# Patient Record
Sex: Male | Born: 1945
Health system: Southern US, Community
[De-identification: ages and names within clinical notes are randomized; demographics above are authoritative.]

## PROBLEM LIST (undated history)

## (undated) DIAGNOSIS — G4733 Obstructive sleep apnea (adult) (pediatric): Secondary | ICD-10-CM

## (undated) DIAGNOSIS — K219 Gastro-esophageal reflux disease without esophagitis: Secondary | ICD-10-CM

## (undated) DIAGNOSIS — E669 Obesity, unspecified: Secondary | ICD-10-CM

## (undated) DIAGNOSIS — I2510 Atherosclerotic heart disease of native coronary artery without angina pectoris: Secondary | ICD-10-CM

## (undated) DIAGNOSIS — T883XXA Malignant hyperthermia due to anesthesia, initial encounter: Secondary | ICD-10-CM

## (undated) DIAGNOSIS — T4145XA Adverse effect of unspecified anesthetic, initial encounter: Secondary | ICD-10-CM

## (undated) DIAGNOSIS — I10 Essential (primary) hypertension: Secondary | ICD-10-CM

## (undated) DIAGNOSIS — I259 Chronic ischemic heart disease, unspecified: Secondary | ICD-10-CM

## (undated) DIAGNOSIS — Z9889 Other specified postprocedural states: Secondary | ICD-10-CM

## (undated) DIAGNOSIS — Z9989 Dependence on other enabling machines and devices: Secondary | ICD-10-CM

## (undated) DIAGNOSIS — E785 Hyperlipidemia, unspecified: Secondary | ICD-10-CM

## (undated) DIAGNOSIS — Z8489 Family history of other specified conditions: Secondary | ICD-10-CM

## (undated) DIAGNOSIS — N529 Male erectile dysfunction, unspecified: Secondary | ICD-10-CM

## (undated) DIAGNOSIS — I350 Nonrheumatic aortic (valve) stenosis: Secondary | ICD-10-CM

## (undated) DIAGNOSIS — G709 Myoneural disorder, unspecified: Secondary | ICD-10-CM

## (undated) DIAGNOSIS — K2270 Barrett's esophagus without dysplasia: Secondary | ICD-10-CM

## (undated) DIAGNOSIS — Z953 Presence of xenogenic heart valve: Secondary | ICD-10-CM

## (undated) DIAGNOSIS — R112 Nausea with vomiting, unspecified: Secondary | ICD-10-CM

## (undated) DIAGNOSIS — T8859XA Other complications of anesthesia, initial encounter: Secondary | ICD-10-CM

## (undated) DIAGNOSIS — Z955 Presence of coronary angioplasty implant and graft: Secondary | ICD-10-CM

## (undated) HISTORY — PX: CAROTID ENDARTERECTOMY: SUR193

## (undated) HISTORY — DX: Gastro-esophageal reflux disease without esophagitis: K21.9

## (undated) HISTORY — DX: Essential (primary) hypertension: I10

## (undated) HISTORY — DX: Obesity, unspecified: E66.9

## (undated) HISTORY — DX: Dependence on other enabling machines and devices: Z99.89

## (undated) HISTORY — DX: Chronic ischemic heart disease, unspecified: I25.9

## (undated) HISTORY — DX: Nonrheumatic aortic (valve) stenosis: I35.0

## (undated) HISTORY — DX: Obstructive sleep apnea (adult) (pediatric): G47.33

## (undated) HISTORY — DX: Barrett's esophagus without dysplasia: K22.70

## (undated) HISTORY — DX: Atherosclerotic heart disease of native coronary artery without angina pectoris: I25.10

## (undated) HISTORY — DX: Presence of coronary angioplasty implant and graft: Z95.5

## (undated) HISTORY — PX: OTHER SURGICAL HISTORY: SHX169

## (undated) HISTORY — DX: Hyperlipidemia, unspecified: E78.5

## (undated) HISTORY — DX: Male erectile dysfunction, unspecified: N52.9

---

## 1983-12-13 HISTORY — PX: CIRCUMCISION: SUR203

## 2002-09-17 ENCOUNTER — Ambulatory Visit (HOSPITAL_COMMUNITY): Admission: RE | Admit: 2002-09-17 | Discharge: 2002-09-17 | Payer: Self-pay | Admitting: Cardiology

## 2002-10-11 ENCOUNTER — Ambulatory Visit (HOSPITAL_COMMUNITY): Admission: RE | Admit: 2002-10-11 | Discharge: 2002-10-11 | Payer: Self-pay | Admitting: *Deleted

## 2002-10-11 ENCOUNTER — Encounter: Payer: Self-pay | Admitting: *Deleted

## 2002-10-15 ENCOUNTER — Encounter: Payer: Self-pay | Admitting: Vascular Surgery

## 2002-10-16 ENCOUNTER — Ambulatory Visit (HOSPITAL_COMMUNITY): Admission: RE | Admit: 2002-10-16 | Discharge: 2002-10-16 | Payer: Self-pay | Admitting: Vascular Surgery

## 2002-10-29 ENCOUNTER — Encounter (INDEPENDENT_AMBULATORY_CARE_PROVIDER_SITE_OTHER): Payer: Self-pay | Admitting: Specialist

## 2002-10-29 ENCOUNTER — Inpatient Hospital Stay (HOSPITAL_COMMUNITY): Admission: RE | Admit: 2002-10-29 | Discharge: 2002-11-01 | Payer: Self-pay | Admitting: *Deleted

## 2002-10-30 ENCOUNTER — Encounter: Payer: Self-pay | Admitting: *Deleted

## 2002-11-08 ENCOUNTER — Ambulatory Visit (HOSPITAL_COMMUNITY): Admission: RE | Admit: 2002-11-08 | Discharge: 2002-11-08 | Payer: Self-pay | Admitting: *Deleted

## 2002-11-08 ENCOUNTER — Encounter: Payer: Self-pay | Admitting: *Deleted

## 2003-12-13 DIAGNOSIS — Z955 Presence of coronary angioplasty implant and graft: Secondary | ICD-10-CM

## 2003-12-13 HISTORY — DX: Presence of coronary angioplasty implant and graft: Z95.5

## 2003-12-13 HISTORY — PX: CORONARY STENT PLACEMENT: SHX1402

## 2004-11-12 ENCOUNTER — Ambulatory Visit (HOSPITAL_COMMUNITY): Admission: RE | Admit: 2004-11-12 | Discharge: 2004-11-13 | Payer: Self-pay | Admitting: Cardiology

## 2007-09-13 ENCOUNTER — Ambulatory Visit: Payer: Self-pay | Admitting: *Deleted

## 2008-05-28 ENCOUNTER — Encounter: Admission: RE | Admit: 2008-05-28 | Discharge: 2008-05-28 | Payer: Self-pay | Admitting: Cardiology

## 2008-12-12 HISTORY — PX: CARDIAC CATHETERIZATION: SHX172

## 2009-08-07 ENCOUNTER — Encounter: Admission: RE | Admit: 2009-08-07 | Discharge: 2009-08-07 | Payer: Self-pay | Admitting: Cardiology

## 2009-08-14 ENCOUNTER — Inpatient Hospital Stay (HOSPITAL_BASED_OUTPATIENT_CLINIC_OR_DEPARTMENT_OTHER): Admission: RE | Admit: 2009-08-14 | Discharge: 2009-08-14 | Payer: Self-pay | Admitting: Cardiology

## 2010-07-14 ENCOUNTER — Ambulatory Visit: Payer: Self-pay | Admitting: Cardiology

## 2010-10-15 ENCOUNTER — Ambulatory Visit: Payer: Self-pay | Admitting: Cardiology

## 2011-03-18 LAB — POCT I-STAT GLUCOSE
Glucose, Bld: 143 mg/dL — ABNORMAL HIGH (ref 70–99)
Operator id: 221371

## 2011-04-26 NOTE — H&P (Signed)
NAMECLANTON, EMANUELSON                   ACCOUNT NO.:  [id number]   MEDICAL RECORD NO.:  [id number]          PATIENT TYPE:  DOUT   LOCATION:  CARD                           FACILITY:   PHYSICIAN:  Colleen Can. Deborah Chalk, M.D.DATE OF BIRTH:  [birth year]   DATE OF ADMISSION:  08/14/2009  DATE OF DISCHARGE:                              HISTORY & PHYSICAL   CHIEF COMPLAINT:  None.   HISTORY OF PRESENT ILLNESS:  Theodore Jenkins is a very pleasant 65 year old white  male who has known ischemic heart disease.  He has had recent stress  testing which has proven to be abnormal.  He did have EKG changes with  exercise.  His Cardiolite pictures demonstrated distal inferior  ischemia.  Clinically, he has had no complaints of chest pain.  He is  now referred for cardiac catheterization.   PAST MEDICAL HISTORY.:  1. Known atherosclerotic cardiovascular disease with subsequent      stenting of the LAD in December 2005.  Other findings at that time      showed a normal ejection fraction and moderately severe 60-70%      stenosis in the obtuse marginal with essentially totally occluded      posterior descending with collateral fill from the left system.  2. Obesity.  3. Hyperlipidemia.  4. Hypertension.  5. Sleep apnea.  6. Diabetes.  7. Previous right carotid endarterectomy with subsequent complete      closure.  8. History of rotator cuff surgery.  9. History of Barrett esophagus.   ALLERGIES:  None.   CURRENT MEDICINES:  1. Metformin 500 mg b.i.d.  2. Crestor 10 mg a day.  3. Toprol-XL 50 mg a day.  4. Aspirin daily.  5. Prilosec over-the-counter daily.  6. Imdur 30 mg a day.  7. Nitroglycerin p.r.n.  8. Clarinex 10 mg a day.  9. Niaspan 1000 mg at bedtime.   FAMILY HISTORY:  Father died at 83 after having bypass surgery, but had  had his first heart attack in his early 72s.  Mother has had a history  of cancer.   SOCIAL HISTORY:  He is married.  He works as a Production designer, theatre/television/film at [employer].   He tries to exercise regularly.  He stopped smoking in  1984.   REVIEW OF SYSTEMS:  He has had no chest pain or shortness of breath.  He  has had erectile dysfunction, but is not a candidate for medicines  because of his chronic nitrate use.  He has had some sinus issues and  numbness of his hands when he is standing.  All other review of systems  are negative.   PHYSICAL EXAMINATION:  GENERAL:  He is a pleasant, obese white male, in  no acute distress.  VITAL SIGNS:  His weight is 247 pounds, blood pressure was 92/60  sitting, heart rate 68 and regular, respirations 80, and he is afebrile.  SKIN:  Warm and dry.  Color is unremarkable.  HEENT:  Normocephalic and atraumatic.  Pupils are equal and reactive.  Sclerae are nonicteric.  NECK:  Supple, thick, with  no JVD and no masses.  LUNGS:  Clear.  CARDIAC:  Regular rhythm.  ABDOMEN:  Obese and soft with positive bowel sounds.  EXTREMITIES:  Without edema.  NEUROLOGIC:  No gross focal deficits.  MUSCULOSKELETAL:  Strength to be symmetric.  Gait and range of motion  are intact.   Pertinent labs are pending.   OVERALL IMPRESSION:  1. Abnormal stress Cardiolite study.  2. Known ischemic heart disease with remote stenting of the left      anterior descending.  3. Obesity.  4. Hyperlipidemia.  5. Hypertension.  6. Diabetes.   PLAN:  We will proceed on with cardiac catheterization.  The risks of  procedure and benefits have all been reviewed, and he is willing to  proceed on Friday, August 14, 2009.  Metformin will be held starting  the day prior to the procedure and then thereafter.      Sharlee Blew, N.P.      Colleen Can. Deborah Chalk, M.D.  Electronically Signed    LC/MEDQ  D:  08/07/2009  T:  08/08/2009  Job:  [id number]

## 2011-04-26 NOTE — Procedures (Signed)
CAROTID DUPLEX EXAM   INDICATION:  Follow up carotid artery disease.   HISTORY:  Diabetes:  Yes.  Cardiac:  Coronary artery disease, angioplasty and stent in 1995 and  also in December, 2005.  Hypertension:  Yes.  Smoking:  Q uit in 1992.  Previous Surgery:  Right carotid endarterectomy with DPA on 10/29/2002  by Dr. Madilyn Fireman.  CV History:  Amaurosis Fugax No, Paresthesias No, Hemiparesis No                                       RIGHT             LEFT  Brachial systolic pressure:         140               148  Brachial Doppler waveforms:         Biphasic          Biphasic  Vertebral direction of flow:        Antegrade         Antegrade  DUPLEX VELOCITIES (cm/sec)  CCA peak systolic                   48                70  ECA peak systolic                   59                Occluded  ICA peak systolic                   Occluded          79  ICA end diastolic                                     29  PLAQUE MORPHOLOGY:                  Calcified         Calcified  PLAQUE AMOUNT:                      Large             Large  PLAQUE LOCATION:                    ICA               ECA   IMPRESSION:  1. Known left external carotid artery occlusion.  2. Known right internal carotid artery occlusion.  3. Left internal carotid artery patent, status post endarterectomy.  4. Study essentially unchanged from 09/07/2006.   ___________________________________________  P. Liliane Bade, M.D.   DP/MEDQ  D:  09/13/2007  T:  09/13/2007  Job:  [id number]

## 2011-04-29 NOTE — Discharge Summary (Signed)
[name], Theodore Jenkins                               ACCOUNT NO.:  [id number]   MEDICAL RECORD NO.:  [id number]                   PATIENT TYPE:  INP   LOCATION:  3316                                 FACILITY:  [facility]   PHYSICIAN:  Balinda Quails, M.D.                 DATE OF BIRTH:  01-30-1946   DATE OF ADMISSION:  10/29/2002  DATE OF DISCHARGE:  11/01/2002                                 DISCHARGE SUMMARY   ADMISSION DIAGNOSIS:  Asymptomatic right internal carotid artery stenosis.   PAST MEDICAL HISTORY:  1. Hypertension.  2. Coronary artery disease.  3. Barrett's esophagus.  4. Gastroesophageal reflux disease.  5. Sleep apnea treated with CPAP.   ALLERGIES:  No known drug allergies.   DISCHARGE DIAGNOSIS:  Asymptomatic right internal carotid artery stenosis,  status post right carotid endarterectomy.   HISTORY OF PRESENT ILLNESS:  The patient is a 65 year old Caucasian man.  During a routine physical by the [employer], a right  carotid bruit was noted.  He was referred to his cardiologist who  recommended a duplex scan.  This was performed at [facility] on  09/17/02, and showed a severe right internal carotid artery stenosis greater  then 80%.  He was referred to [facility] for further evaluation.  Dr. Madilyn Fireman  evaluated the patient on 10/07/02.  After examination of the patient and  review of the records, he recommended proceeding with an MR angiogram of the  arch and carotid vessels.  This was performed and confirmed the right  internal carotid artery stenosis, also there was a suspicion of possible  stenosis of the arch of the right common carotid artery.  This was the  origin of the right common carotid.  The patient returned to the CVTS office  and saw Dr. Madilyn Fireman on 10/14/02, to discuss these findings.  Dr. Madilyn Fireman  recommendation at that time was to undergo an urgent coronary arteriography  on 10/16/02.  The patient returned to the [facility] office to discuss the  results  of all these examinations.  Dr. Madilyn Fireman recommendation was to proceed with the  right carotid endarterectomy to reduce his risk for a stroke.  The procedure  was extensively discussed with the patient and he agreed to proceed.   HOSPITAL COURSE:  The patient was admitted to [facility] under the  care of Dr. Denman George on 10/29/02.  He underwent the following  surgical procedure:  Right carotid endarterectomy with vein patch  angioplasty.  Findings at the time of surgery were a very high carotid  bifurcation resulting in a very difficult surgery.  He tolerated the  procedure well, and was transferred in stable condition to the PACU.   On the morning of postoperative day #1, the patient was hemodynamically  stable, neurologically intact except for some right tongue numbness and  swallowing was difficult.  There  were no signs of choking or aspiration.  Because of these findings, speech therapy was consulted for a swallowing  evaluation.  Bedside swallow examination revealed some sign of aspiration  with throat clearing with sips of water from a cup.  The recommendation was  to proceed with a modified barium swallow to assess his swallowing further.  This was performed later in the day.  The results were that the patient had  some frank true penetration of thin liquids due to pharyngeal residual  spilling into his airway intermittently.  Recommendation was to start on a  diet of thin liquids only.  By the morning of 10/31/02, speech therapy re-  evaluated his swallowing, and he was noted to be without any signs or  symptoms of aspiration while sipping on Ensure.   The morning of 11/01/02, postoperative day #3, the patient reported feeling  much better.  His vital signs were stable, he was afebrile.  He tongue still  felt numb, with his speech a little thickened.  His swallowing was much  better.  Reported that he can take full swallow rather then just a sip.  He  has  not had any solid food yet.  His right neck incision remained clean and  dry, and Steri-Strips intact.  Just mild swelling on the right.  His pain is  well controlled.  He is ambulating independently.  The patient is making  very good progress status post his endarterectomy despite his mild cranial  nerve dysfunction.  Dr. Madilyn Fireman felt he was ready to be discharged home.  He  was instructed to continue his liquids at home with Ensure supplementation.  He will follow up with speech therapy.   CONDITION ON DISCHARGE:  Improved.   DISCHARGE MEDICATIONS:  1. Tylox one or two p.o. q.4-6h. p.r.n. pain.  2. He is instructed to resume his following home medications:  Prilosec 20     mg p.o. q.d., Gemfibrozil 600 mg p.o. b.i.d., isosorbide 60 mg p.o. q.d.,     Toprol XL 50 mg p.o. q.d., aspirin 81 mg p.o. q.d.   ACTIVITY:  He has been asked to avoid any driving or strenuous activity.  He  is instructed to continue his breathing exercises and daily walking.   DIET:  Liquid diet with Ensure supplementation t.i.d..   WOUND CARE:  He may shower with soap and water.   FOLLOWUP:  1. He has an appointment for a modified barium swallow at [facility] on 11/08/02, at 9 a.m.  2. He has an appointment to see Dr. Madilyn Fireman on Monday, 11/18/02, at 9:30 a.m.        Toribio Harbour, R.N.                  Balinda Quails, M.D.    CTK/MEDQ  D:  11/25/2002  T:  11/25/2002  Job:  [id number]   cc:   Colleen Can. Deborah Chalk, M.D.  [address], [address]  [place]  [place] [address]  Fax: [phone]

## 2011-04-29 NOTE — Op Note (Signed)
NAMEKROSBY, RITCHIE NO.:  [id number]   MEDICAL RECORD NO.:  [id number]                   PATIENT TYPE:  INP   LOCATION:  3316                                 FACILITY:  [facility]   PHYSICIAN:  Balinda Quails, M.D.                 DATE OF BIRTH:  [birth year]   DATE OF PROCEDURE:  10/29/2002  DATE OF DISCHARGE:                                 OPERATIVE REPORT   SURGEON:  Balinda Quails, M.D.   ASSISTANTS:  Di Kindle. Edilia Bo, M.D. and Maple Mirza, P.A.   ANESTHETIC:  General endotracheal.   ANESTHESIOLOGIST:  Quita Skye. Krista Blue, M.D.   PREOPERATIVE DIAGNOSIS:  Severe right internal carotid artery stenosis.   POSTOPERATIVE DIAGNOSIS:  Severe right internal carotid artery stenosis.   PROCEDURE:  Right carotid endarterectomy with saphenous vein patch  angioplasty.   CLINICAL NOTE:  The patient is a 65 year old male with a history of coronary  artery disease.  He has used tobacco products in the past.  Recently noted  to have a right carotid bruit.  Doppler evaluation revealed severe right  internal carotid artery stenosis.  A MR angiogram was performed, which  verified the severe right internal carotid artery stenosis, and possible  stenosis of the right common carotid artery origin.  A carotid arteriogram  was obtained.  This failed to reveal any evidence of common carotid origin  stenosis.  There was however a severe stenosis of the right internal carotid  artery with very sluggish flow and pseudo-occlusion type picture.  There was  intact flow into the intercranial vessels however.  A repeat Doppler  evaluation was obtained prior to his surgery and this again verified very  sluggish flow, but a patent vessel.  His bifurcation was noted to be high in  the neck and the plaque noted to be extending significantly into the  internal carotid artery.  Before surgery, a detailed discussion was carried out with the patient and  family members.  Risks  and benefits of right carotid endarterectomy were  discussed.  Indication for surgery - reduction of stroke risk.  Risk of the  operative procedure - 1-2% to include, but not limited to MI, CVA, cranial  nerve injury, and death.  It was also discussed with the patient that a  segment of vein may be harvested from the patient's leg for repair of the  artery.   OPERATIVE PROCEDURE:  The patient was brought to the operating room in  stable hemodynamic condition.  General endotracheal anesthesia was induced.  Foley catheter and arterial line in place.  The neck was extended and rotated to the left.  The right neck was prepped  and draped in a sterile fashion.  A curvilinear skin incision was made from the edge along the anterior border  of the right sternomastoid muscle.  Dissection was carried down through the  subcutaneous tissue.  Platysma incised.  The neck was quite deep.  The  facial vein and branches of the internal jugular vein were ligated with 2-0  silk and divided.  The carotid bifurcation was exposed.  Carotid bifurcation  was very high in the neck.  The vagus nerve was reflected posteriorly and  preserved.  The common carotid artery was mobilized proximally down to the  omohyoid muscle and encircled with a vessel loop.  The bifurcation of the  carotid artery was under the mandible.  The origin of the superior thyroid  and external carotid arteries were encircled with vessel loops.  The  hypoglossal nerve was freed and mobilized and encircled with a vessel loop.  The ansa cervicalis was divided.  The hypoglossal nerve was fully mobilized  and retracted.  The internal carotid artery was noted to be small in  caliber, however Doppler flow was intact.  The internal carotid artery was  mobilized distally well up into the neck adjacent to the base of the skull.  The patient was administered 7000 units of heparin intravenously.  Adequate  circulation time was permitted.  The carotid  vessels were controlled with  clamps.  A longitudinal arteriotomy was made in the distal common carotid  artery.  The arteriotomy was extended into the internal carotid artery.  The  stenosis was critical with a small pinhole type remaining lumen in the  proximal internal carotid artery.  As the internal carotid artery was opened  distally, there was subacute thrombus present, although the lumen was  patent.  The internal carotid artery was opened well up the neck.  This was  a very difficult technical procedure and the hypoglossal nerve had to be  mobilized and pulled superiorly and inferiorly to fully expose the internal  carotid artery.  The artery was too small to accept a shunt.  Backbleeding  was brisk.  A difficult endarterectomy procedure was carried out.  Quite significant  retraction of the posterior belly of the digastric muscle and the mandible  were required to expose the distal internal carotid artery.  The plaque was  removed initially with an endarterectomy elevator.  Remaining fragments of  plaque were removed with fine forceps.  The endarterectomy was carried  distally.  A tongue of plaque was teased out of the distal internal carotid  artery.  A segment of greater saphenous vein was harvested from the right ankle.  Pulses were noted to be intact in the right lower extremity.  A short  segment of vein was harvested through a longitudinal incision.  The vein was  ligated proximally and distally with 2-0 silk tie.  The vein was then  dilated with heparin saline solution and opened longitudinally.  The vein was placed over the endarterectomy site as a patch angioplasty with  running 6-0 Prolene suture.  At completion of the patch angioplasty, all  vessels were well-flushed.  Clamps were removed directing initial antegrade  flow up the external carotid artery, following this the internal carotid  artery was released. There was an excellent pulse in the distal internal  carotid artery at  termination of the procedure.  The patient was administered 30 mg of  protamine intravenously.  Due to the extent of the dissection required, the neck was drained with a 15  round Blake drain, exited inferiorly to the skin, and affixed to the skin  with a 2-0 silk suture.  The sternomastoid fascia was closed with a running 2-0 Vicryl suture.  The  platysma was closed with a running 3-0 Vicryl suture.  The skin was closed  with 4-0 Monocryl.  The vein harvest incision was closed with a single  subcutaneous layer of running 3-0 Vicryl suture and a skin layer of 4-0  Monocryl.  Steri-Strips were applied to both incisions.  A 4 inch Ace wrap  was applied to the right leg.  Sterile dressing was applied to the right  neck.  Anesthesia was reversed in the operating room.  The patient awakened slowly.  He did however follow commands and moved all extremities.  He was  transferred to the PACU with the endotracheal tube in place and a T-bar.  As  the patient awakened more, he was extubated.  The patient was noted to have mild deviation of the tongue to the right  consistent with retraction of his hypoglossal nerve.  His voice was intact.  His face was symmetric. Strength was equal.  This was a very difficult technical procedure.  The details were discussed  with the family members.  They have been cautioned that he may have some  transient cranial nerve dysfunction.                                                Balinda Quails, M.D.    PGH/MEDQ  D:  10/29/2002  T:  10/30/2002  Job:  [id number]   cc:   Colleen Can. Deborah Chalk, M.D.  [address]  [place]  [place] [address]  Fax: [phone]

## 2011-04-29 NOTE — H&P (Signed)
Theodore Jenkins, [name]                               ACCOUNT NO.:  [id number]   MEDICAL RECORD NO.:  [id number]                   PATIENT TYPE:  INP   LOCATION:  NA                                   FACILITY:  [facility]   PHYSICIAN:  Theodore Jenkins, M.D.                 DATE OF BIRTH:  Nov 20, 1946   DATE OF ADMISSION:  10/29/2002  DATE OF DISCHARGE:                                HISTORY & PHYSICAL   CHIEF COMPLAINT:  Asymptomatic carotid artery disease.   HISTORY OF PRESENT ILLNESS:  The patient is a 65 year old white male  referred by Theodore Jenkins for evaluation of carotid artery disease.  During a  recent routine physical by a [employer] physician a right  carotid bruit was noted.  Duplex scan at [facility] on September 17, 2002 showed severe right ICA stenosis greater than 80%.  He has been  completely asymptomatic.  An MRA was done October 16, 2002 which confirmed  these findings.  There was also question of stenosis at the origin of the  right common carotid artery.  He has also had a cerebral arteriogram done.  Another duplex scan is done today at the office to evaluate a stenosis at  the origin of the right common carotid artery.  Theodore Jenkins has reviewed the  findings and talked to the patient and recommended right carotid  endarterectomy.  This is scheduled for October 29, 2002.   PAST MEDICAL HISTORY:  1. Carotid artery disease recently diagnosed.  2. Hypertension.  3. Coronary artery disease.  4. Barrett's esophagus.  5. GERD.  6. Sleep apnea with CPAP.   (No history of CVA, TIA, or MI.  He has had a satisfactory cardiac checkup  in March 2003).   REVIEW OF SYSTEMS:  He has occasional blurred vision, he denies amaurosis  fugax, he denies garbled speech, vision going dark, he denies weakness in  his hands or feet, he denies blacking out, he denies shortness of breath,  cough, he denies chest pain or palpitations, he denies paroxysmal nocturnal  dyspnea, he denies orthopnea, he denies claudication and lower extremity  edema, he denies GI or GU complaints, he does use the CPAP at night.   PAST SURGICAL HISTORY:  Cardiac catheterization 1996.  Exercise treadmill  test every year.  Nuclear study done last year reportedly okay.   MEDICATIONS:  1. Prilosec 20 mg daily.  2. Lopid 600 mg two tablets daily.  3. Pravachol 20 mg daily.  4. Imdur 60 mg daily.  5. Toprol XL 50 mg daily.  6. Aspirin 81 mg daily.  7. Nitrostat 0.4 mg p.r.n.   ALLERGIES:  No known drug allergies.   FAMILY HISTORY:  Mother is deceased in her mid 27s from diabetes and cancer.  Father deceased age 49 from MI and CVA.  He has four sisters living and  one  brother.  He does have a family history of diabetes, coronary artery  disease, and extracranial cerebrovascular occlusive disease.   SOCIAL HISTORY:  He is married.  He has one child, a daughter, who has  mitral valve prolapse.  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  He quit smoking 10 years ago after two packs a day for 20 years.  He  has about six beers a week.  His wife is with him here today and is  supportive.   PHYSICAL EXAMINATION:  GENERAL: Well-nourished, well-developed, obese white  male.  Ambulatory.  No acute distress.  Normal affect.  Pleasant.  VITAL SIGNS: Weight 247 pounds, height 5 feet 9 inches, BP 130/80, pulse 66  regular.  HEENT: Normocephalic, atraumatic.  Pupils equal, round, reactive to light.  Extraocular movements intact.  Visual acuity is grossly intact OU.  Oropharynx is grossly normal.  NECK: Supple, nontender.  No JVD.  No lymphadenopathy.  There is a soft  right carotid bruit.  CHEST: Symmetrical.  Breath sounds are clear and equal anterior and  posterior.  CARDIAC: Regular rate and rhythm S1 and S2 with no murmur, rub, or gallop.  ABDOMEN: Obese, soft, nontender, nondistended with bowel sounds present.  No  masses felt.  No bruits heard.   EXTREMITIES: Warm and well perfused.  There is no edema, cyanosis, no  clubbing.  VASCULAR: Carotid pulses are 2+ bilaterally with a soft right carotid bruit.  Femoral 2+ pulses bilaterally with no bruits.  The lower extremities are  warm and well perfused.  NEUROLOGICAL: Grossly nonfocal.  Cranial nerves II-XII are intact.  Gait is  steady.  Muscular strength is +5/5 throughout.  Deep tendon reflexes are 2+.   ASSESSMENT AND PLAN:  The patient is a 65 year old white male with severe  asymptomatic right internal carotid artery stenosis.  Theodore Jenkins has reviewed  the data and recommended right carotid endarterectomy to reduce the risk of  cerebrovascular accident.  This has been discussed with the patient.  His  surgery is scheduled for October 29, 2002.     Theodore Jenkins, Theodore Jenkins, M.D.    Theodore Jenkins  D:  10/24/2002  T:  10/24/2002  Job:  [id number]

## 2011-04-29 NOTE — Consult Note (Signed)
   Theodore Jenkins, NOON NO.:  [id number]   MEDICAL RECORD NO.:  [id number]                   PATIENT TYPE:  OIB   LOCATION:  2864                                 FACILITY:  [facility]   PHYSICIAN:  Dorothea Glassman., M.D.               DATE OF BIRTH:  08/30/1946   DATE OF CONSULTATION:  10/16/2002  DATE OF DISCHARGE:  10/16/2002                                   CONSULTATION   PROCEDURE:  Cerebral angiogram.   Catheterization was performed by Dr. Hart Rochester in [place] on October 16, 2002.  The arch and cervical portion of that study is interpreted separately by Dr.  Hart Rochester in [place].   Right internal carotid artery two views intracranial.   There is a severe stenosis of the proximal right internal carotid artery  with very slow flow in the right internal carotid artery.  The right  internal carotid artery appears to be patent in to the cavernous segment.  Due to very slow flow it is not possible to evaluate the intracranial  circulation however, the right ICA does appear to be patent throughout its  course.   Left internal carotid artery intracranial two views.   The left internal carotid artery is widely patent in the intracranial  portion and cavernous segments.  There is prompt filling of the right middle  cerebral and right anterior cerebral arteries as well as the left anterior  and middle cerebral arteries.  There is no intracranial stenosis or  aneurysm.   The right vertebral artery is partially visualized intracranial due to  reflux from the right carotid injection.  The distal vertebral artery, the  basilar and both posterior tibial arteries are patent although not fully  opacified.   IMPRESSION:  Severe stenosis of the proximal right internal carotid artery  with very slow flow but patent right internal carotid artery in to the  intracranial portion.   Left internal carotid artery is widely patent in the intracranial portion.  There is filling of  right and left anterior cerebral arteries and middle  cerebral arteries from the left carotid injection.                                               Dorothea Glassman., M.D.    DC/MEDQ  D:  02/24/2003  T:  02/24/2003  Job:  [id number]

## 2011-04-29 NOTE — Cardiovascular Report (Signed)
Theodore Jenkins, Theodore Jenkins                   ACCOUNT NO.:  [id number]   MEDICAL RECORD NO.:  [id number]          PATIENT TYPE:  OIB   LOCATION:  6531                         FACILITY:  [facility]   PHYSICIAN:  Colleen Can. Deborah Chalk, M.D.DATE OF BIRTH:  [birth year]   DATE OF PROCEDURE:  11/12/2004  DATE OF DISCHARGE:  11/13/2004                              CARDIAC CATHETERIZATION   HISTORY:  Mr. Nunley is a 65 year old male referred for catheterization after  abnormal stress Cardiolite study.   PROCEDURE:  Left heart catheterization with selective coronary angiography,  left ventricular angiography, and stent placement in the left anterior  descending coronary.   TYPE AND SITE OF ENTRY:  Percutaneous, right femoral artery with AngioSeal.   CATHETERS:  6-French 4 curved Judkins right and left coronary catheters, 6-  French pigtail ventriculographic catheter, 6-French JL4 guide, high-torque  floppy guidewire, Prowater guidewire, 2.0 x 15 mm Maverick balloon, 2.5 x 18  mm Cypher stents.   MEDICATIONS GIVEN PRIOR TO THE PROCEDURE:  Valium 10 mg p.o.   MEDICATIONS GIVEN DURING THE PROCEDURE:  Versed 5 mg total, Integrilin,  heparin, and Plavix, Ancef.   COMMENTS:  The patient, in general, tolerated the procedure well.   HEMODYNAMIC DATA:  1.  The aortic pressure was 114/79.  2.  LV was 116/4-9.  3.  There was no aortic valve gradient noted on pullback.   ANGIOGRAPHIC DATA:  1.  Left main coronary artery was essentially normal.  2.  Left circumflex:  Left circumflex had two large marginal vessels.  There      was a 60-70% narrowing in the first marginal.  This was somewhat subtle      in the RAO projections, but more visible in the LAO projections.  3.  Left anterior descending:  Left anterior descending had diffuse      irregularities.  At the level of the third diagonal vessel there was a      severe 90-95% somewhat segmental stenosis.  This was distal in the left      anterior descending.   Between the third diagonal and the much larger      second diagonal there was a 60-70% narrowing that really was      unappreciated until we were unable to pass the stent into the distal      lesion.  4.  Right coronary artery:  The right coronary artery is a very large,      dominant vessel with diffuse irregularities in this large 4 mm vessel.      The large posterolateral branch was free of significant obstructive      disease.  The posterior descending vessel, however, was very diffusely      diseased with severe segmental and diffuse narrowings.   LEFT VENTRICULOGRAM:  Left ventricular angiogram was performed in the RAO  projection.  Overall cardiac size and silhouette are normal.  There is very  mild distal inferior apical hypokinesia.   ANGIOPLASTY PROCEDURE:  Using 6-French [id number] guide, we had difficulty engaging  the ostium, but had satisfactory back-up after we were engaged.  Initially  we used a high-torque floppy wire and then subsequently a Prowater wire to  position the guidewires down the left anterior descending.  We were able to  predilate with a 2.0 x 15 mm Maverick balloon.  We then tried to pass a  Cypher stent distally and were unable to pass the 2.5 x 18 mm Cypher stent  distally.  We then returned and did angioplasty with a 2.0 x 15 mm Maverick  balloon proximal to the third diagonal vessel.  We were then able to pass  the 2.5 x 18 mm Cypher stent past the bifurcation lesion at the level of the  third diagonal.  We then placed a second Cypher stent and overlapped the  proximal portion of the distal stent resulting in approximately 35 mm of  segmental stenting, but excellent flow and no residual lesion.  Although  this was a long procedure, the patient tolerated it well throughout.   OVERALL IMPRESSION:  1.  Successful stent placements in a segment of distal left anterior      descending coronary.  2.  Residual moderately severe (60-70%) stenosis in the obtuse  marginal with      essentially totally occluded posterior descending vessel with collateral      fill in this diffusely diseased vessel from the left system.  3.  Reasonably well preserved left ventricular function with very mild      inferior hypokinesia.       SNT/MEDQ  D:  11/12/2004  T:  11/13/2004  Job:  [id number]   cc:   Claude Manges. Cleophas Dunker, M.D.  [address] [place]  [place] [address]  Fax: [phone]

## 2011-04-29 NOTE — Op Note (Signed)
Theodore Jenkins, Theodore Jenkins                               ACCOUNT NO.:  [id number]   MEDICAL RECORD NO.:  [id number]                   PATIENT TYPE:  OIB   LOCATION:  NA                                   FACILITY:  [facility]   PHYSICIAN:  Quita Skye. Hart Rochester, M.D.               DATE OF BIRTH:  1946-01-27   DATE OF PROCEDURE:  10/16/2002  DATE OF DISCHARGE:                                 OPERATIVE REPORT   PREOPERATIVE DIAGNOSIS:  Severe right internal carotid artery stenosis, rule  out right common carotid stenosis at origin.   PROCEDURES:  1. Arch aortogram via right common femoral approach.  2. Selective innominate angiogram.  3. Selective left common carotid angiogram.   SURGEON:  Quita Skye. Hart Rochester, M.D.   FIRST ASSISTANT:  Balinda Quails, M.D.   ANESTHESIA:  Local Xylocaine.   COMPLICATIONS:  None.   DESCRIPTION OF PROCEDURE:  The patient was taken to the [facility] Peripheral Endovascular Lab and placed in the supine  position, at which time both groins were prepped with Betadine solution and  draped in the routine sterile manner.  After infiltration with 1% Xylocaine,  the right common femoral artery was entered percutaneously.  A guide wire  was passed into the aorta under fluoroscopic guidance.  A 5 French sheath  and dilator were passed over the guide wire.  The dilator was removed and a  standard pigtail catheter was advanced into the aortic arch.  An arch  aortogram was performed, injected 30 cc of contrast at 20 cc/sec.  This  revealed the arch to have normal anatomy.  The innominate artery was widely  patent with a tortuosity at the origin of the right common carotid artery  with the right subclavian artery appearing to be widely patent.  Both  vertebral arteries were large and filled rapidly.  The left common carotid  artery deviation to the patient's left side as did the left subclavian, but  neither had significant stenosis.  The pigtail catheter was then  exchanged  for a Simmons 1 catheter and the innominate artery was selectively  cannulated.  Using an RAO projection, the tortuosity of the innominate  artery was examined, injecting 6 cc of contrast at 6 cc/sec.  This revealed  no stenosis at the origin of the right common carotid artery or the right  subclavian artery.  A second injection was performed given 6 cc of contrast  at 6 cc/sec, examining the carotid artery and intercerebral views in a  biplane approach.  This revealed the right common artery to be widely patent  at the bifurcation with a 99% stenosis at the origin of the right internal  carotid artery with filling of the distal internal carotid artery as far as  could be visualized.  The right external carotid artery appeared widely  patent.  Intercerebral views will be  interpreted by the radiology  department.  The Simmons catheter was then removed from the innominate  artery and the left common carotid artery was selectively cannulated with  the [facility] catheter and left common carotid angiogram (biplane) was  performed injecting 6 cc of contrast at 4 cc/sec.  This revealed the left  common internal and external carotid arteries to all be widely patent with  very plaque formation, but no significant stenosis in the extracranial  views.  Having tolerated the procedure well, the [facility] catheter was  removed over the guide wire.  The guide wire was removed.  The sheath was  removed.  Adequate compression was applied.  No complications ensued.    FINDINGS:  1. 99% proximal right internal carotid artery stenosis.  2. No evidence of proximal right common stenosis.  3. Widely patent left carotid bifurcation.  4. Patent large bilateral vertebral arteries.                                               Quita Skye Hart Rochester, M.D.    JDL/MEDQ  D:  10/16/2002  T:  10/16/2002  Job:  [id number]

## 2011-04-29 NOTE — Discharge Summary (Signed)
NAMEREUBEN, KNOBLOCK                   ACCOUNT NO.:  [id number]   MEDICAL RECORD NO.:  [id number]          PATIENT TYPE:  OIB   LOCATION:  6531                         FACILITY:  [facility]   PHYSICIAN:  Colleen Can. Deborah Chalk, M.D.DATE OF BIRTH:  [birth year]   DATE OF ADMISSION:  11/12/2004  DATE OF DISCHARGE:  11/13/2004                                 DISCHARGE SUMMARY   PRIMARY DISCHARGE DIAGNOSIS:  Abnormal stress Cardiolite study with  subsequent elective cardiac catheterization showing normal left ventricle  function, severe stenosis in the right coronary with successful stent  placement in a long segment of the right coronary artery, persistent patency  of the previously placed stent in the left anterior descending artery with  ostial stenosis of the diagonal vessels, 60-70% stenosis in the obtuse  marginal vessel of the left circumflex.   SECONDARY DISCHARGE DIAGNOSES:  1.  Known ischemic heart disease.  2.  Obesity.  3.  Dyslipidemia.  4.  Hypertension.  5.  Sleep apnea.  6.  Previous right carotid endarterectomy, November 2003, with subsequent      complete closure, maintained previously on Plavix.  7.  Rotator cuff injury.   HISTORY OF PRESENT ILLNESS:  The patient is a very pleasant 65 year old male  who has basically been asymptomatic.  He was referred for a routine  Cardiolite study which was performed in November 2005.  He had good exercise  tolerance with normal blood pressure response.  His images, however, showed  an inferior ischemia.  He was subsequently referred for elective  catheterization.  Clinically, he had had no complaints of chest pain.  Please see the dictated history and physical for further patient  presentation and profile.   LABORATORY DATA:  Chest x-ray showed borderline cardiomegaly.  There was no  acute process.   PT and PTT were unremarkable.  CBC was normal.  Chemistries were normal.   HOSPITAL COURSE:  The patient was admitted electively on  November 12, 2004.  We proceeded on with cardiac catheterization. That procedure was tolerated  well without any known complications.  The results are as noted above.  He  was subsequently seen the following morning and was felt to be a stable  candidate for discharge.   DISCHARGE CONDITION:  Stable.   DISCHARGE MEDICATIONS:  1.  He will resume all of his previous home medicines.  2.  He will stay on Plavix 75 mg for six months.   We will have him seen in the office, on November 19, 2004, certainly sooner  if problems arise in the interim.       LC/MEDQ  D:  11/19/2004  T:  11/20/2004  Job:  [id number]   cc:   Colleen Can. Deborah Chalk, M.D.  Fax: [phone]   Claude Manges. Cleophas Dunker, M.D.  [address] [place]  [place] [address]  Fax: [phone]

## 2011-04-29 NOTE — Discharge Summary (Signed)
Theodore Jenkins, Theodore Jenkins                   ACCOUNT NO.:  [id number]   MEDICAL RECORD NO.:  [id number]          PATIENT TYPE:  OIB   LOCATION:  6531                         FACILITY:  [facility]   PHYSICIAN:  Colleen Can. Deborah Chalk, M.D.DATE OF BIRTH:  [birth year]   DATE OF ADMISSION:  11/12/2004  DATE OF DISCHARGE:  11/13/2004                                 DISCHARGE SUMMARY   DISCHARGE DIAGNOSES:  1.  Abnormal stress Cardiolite study with subsequent elective cardiac      catheterization with stent placement to the left anterior descending.  2.  Known history of ischemic heart disease.  3.  History of obesity.  4.  Dyslipidemia.  5.  Hypertension.  6.  Sleep apnea.  7.  Previous right carotid endarterectomy with subsequent complete closure.  8.  History of rotator cuff injury.   HISTORY OF PRESENT ILLNESS:  The patient is a very pleasant 65 year old male  who was referred for elective cardiac catheterization following an abnormal  Cardiolite study.  His Cardiolite was performed as a routine follow-up.  He  had good exercise tolerance, however, his images showed an inferior  ischemia.  He was subsequently referred for elective catheterization.  Please see dictated history and physical for further patient presentation  and profile.   LABORATORY DATA:  His chest x-ray showed borderline cardiomegaly with no  acute process.  PT and PTT were unremarkable.  CBC was normal.  Chemistries  were normal.   HOSPITAL COURSE:  The patient was admitted on November 12, 2004.  He  underwent cardiac catheterization per Colleen Can. Deborah Chalk, M.D. without any  known complications.  The findings are as followed:  The left main was  essentially normal.  The left circumflex has two large marginal vessels.  There is a 60 to 70% narrowing in the first marginal that was somewhat  subtle in the RAO projection, but more visible in the LAO projection.  The  LAD had diffuse irregularities at the level of the third diagonal.   There  was severe 90 to 95% somewhat segmental stenosis.  This was distal in the  LAD.  Between the third diagonal and the much larger second diagonal, there  was a 60 to 70% narrowing that was really unappreciated until we were unable  to pass the stent into the distal lesion.  The right coronary is a very  large dominant vessel with diffuse irregularities in a 4 mm vessel.  The  large posterolateral branch is free of significant obstructive disease,  however, the posterior descending is diffusely diseased with severe  segmental and diffuse narrowings.  Angioplasty was subsequently performed  with subsequent stents placed to the segment in the distal left anterior  descending.  A 2.5 x 18 mm Cypher stent was placed passed the bifurcation  lesion at the level of the third diagonal.  A second Cypher stent was placed  and overlapped the proximal portion of the distal stent resulting in  approximately 35 mm of segmental stenting, but with excellent flow and no  residual lesion.  The patient does have residual moderate to  severe 60 to  70% stenosis in the obtuse marginal with essentially totally occluded  posterior descending vessel with collateral fills into this diffusely  diseased vessel from the left system.  There was well preserved LV function.  Post procedure he was transferred to 6500.  He was deemed stable for  discharge the following morning.   CONDITION ON DISCHARGE:  Stable.   DISCHARGE MEDICATIONS:  He will resume all of his previous medicines.  He  will continue with Plavix that he has been on chronically.   FOLLOW UP:  We will see him back in the office on November 19, 2004, or  certainly sooner if any problems would arise.       LC/MEDQ  D:  11/22/2004  T:  11/22/2004  Job:  [id number]   cc:   Claude Manges. Cleophas Dunker, M.D.  [address] [place]  [place] [address]  Fax: [phone]

## 2011-04-29 NOTE — H&P (Signed)
Theodore Jenkins, Theodore Jenkins                   ACCOUNT NO.:  [id number]   MEDICAL RECORD NO.:  [id number]          PATIENT TYPE:  INP   LOCATION:                               FACILITY:  [facility]   PHYSICIAN:  Colleen Can. Deborah Chalk, M.D.DATE OF BIRTH:  [birth year]   DATE OF ADMISSION:  11/12/2004  DATE OF DISCHARGE:                                HISTORY & PHYSICAL   CHIEF COMPLAINT:  None.   HISTORY OF PRESENT ILLNESS:  Theodore Jenkins is a very pleasant 65 year old male  who has known coronary disease.  He has had a recent stress-Cardiolite study  performed for preoperative clearance as well as for a follow up study.  He  has plans for possible upcoming rotator cuff surgery with Dr. Norlene Campbell.  He does have known coronary disease with his last  catheterization dating back to 45.  Clinically he has done well. He has  really not noted much in the way of angina.   He was referred for a stress-Cardiolite study which was performed on  October 27, 2004. With that he demonstrated good exercise tolerance with  normal blood pressure response, he had no chest pain or shortness of breath.  He has no ST or T wave changes to suggest ischemia.  However his images  showed inferior ischemia. He is now referred for elective cardiac  catheterization.   PAST MEDICAL HISTORY:  1.  Known atherosclerotic cardiovascular disease with his last      catheterization dating back to July of 1996.  At that time he had normal      LV function, 3-vessel disease with 80% distal posterior descending, 90%      distal LAD, and 70% obtuse marginal vessel.  2.  Obesity.  3.  Dyslipidemia.  4.  Hypertension.  5.  History of sleep apnea.  6.  Previous right carotid endarterectomy in November of 2003 with      subsequent complete closure previously maintained on Plavix.  7.  History of Barrett's esophagus.   ALLERGIES:  None.   CURRENT MEDICATIONS:  1.  Nitroglycerin p.r.n.  2.  Imdur 60 mg daily.  3.  Prilosec  over-the-counter daily.  4.  Aspirin daily.  5.  Toprol XL 50 mg daily.  6.  Crestor 10 mg daily.   FAMILY HISTORY:  His father died at 85 after having bypass surgery but had  his first heart attack in his early 44's.   SOCIAL HISTORY:  He has been a Production designer, theatre/television/film at [employer]. He does  exercise regularly.  He has rare alcohol use and is a nonsmoker.   REVIEW OF SYSTEMS:  Basically as noted above and is otherwise unremarkable.   PHYSICAL EXAMINATION:  GENERAL:  He is a very pleasant middle-aged white  male in no acute distress. Weight is 258 pounds.  VITAL SIGNS:  Blood pressure 130/80 sitting, 140/84 standing, heart rate is  68 and regular, respirations 18, he is afebrile.  SKIN:  Warm and dry and color is unremarkable.  HEENT:  Unremarkable.  LUNGS:  Clear.  HEART:  Shows a regular rhythm.  ABDOMEN:  Obese.  EXTREMITIES:  Are without edema.  NEUROLOGIC:  Intact. There are no gross focal deficits.   PERTINENT LABS:  Are pending.   OVERALL IMPRESSION:  1.  Abnormal stress-Cardiolite study.  2.  Known coronary disease.  3.  Obesity.  4.  Dyslipidemia.  5.  Hypertension.  6.  Known occlusion of the right carotid artery.   PLAN:  Will proceed with elective cardiac catheterization. The procedure has  been reviewed in full detail, including the risk and benefits with both the  patient and his wife and they are willing to proceed on Friday, November 12, 2004.       ________________________________________  Sharlee Blew, N.P.  ___________________________________________  Colleen Can. Deborah Chalk, M.D.    LC/MEDQ  D:  11/10/2004  T:  11/10/2004  Job:  [id number]   cc:   Claude Manges. Cleophas Dunker, M.D.  [address] [place]  [place] [address]  Fax: [phone]

## 2011-05-04 ENCOUNTER — Other Ambulatory Visit: Payer: Self-pay | Admitting: *Deleted

## 2011-05-04 MED ORDER — METOPROLOL SUCCINATE ER 50 MG PO TB24: 50.0000 mg | ORAL_TABLET | Freq: Every day | ORAL | Status: DC

## 2011-05-04 NOTE — Telephone Encounter (Signed)
Toprol to [employer]

## 2011-05-31 ENCOUNTER — Encounter: Payer: Self-pay | Admitting: Nurse Practitioner

## 2011-06-06 ENCOUNTER — Ambulatory Visit (INDEPENDENT_AMBULATORY_CARE_PROVIDER_SITE_OTHER): Payer: Self-pay | Admitting: Nurse Practitioner

## 2011-06-06 ENCOUNTER — Encounter: Payer: Self-pay | Admitting: Nurse Practitioner

## 2011-06-06 ENCOUNTER — Other Ambulatory Visit (INDEPENDENT_AMBULATORY_CARE_PROVIDER_SITE_OTHER): Payer: Self-pay | Admitting: *Deleted

## 2011-06-06 VITALS — BP 118/82 | HR 70 | Ht 69.0 in | Wt 241.0 lb

## 2011-06-06 DIAGNOSIS — I10 Essential (primary) hypertension: Secondary | ICD-10-CM | POA: Insufficient documentation

## 2011-06-06 DIAGNOSIS — E785 Hyperlipidemia, unspecified: Secondary | ICD-10-CM

## 2011-06-06 DIAGNOSIS — I2510 Atherosclerotic heart disease of native coronary artery without angina pectoris: Secondary | ICD-10-CM

## 2011-06-06 HISTORY — DX: Atherosclerotic heart disease of native coronary artery without angina pectoris: I25.10

## 2011-06-06 LAB — HEPATIC FUNCTION PANEL
ALT: 25 U/L (ref 0–53)
AST: 39 U/L — ABNORMAL HIGH (ref 0–37)
Albumin: 4.8 g/dL (ref 3.5–5.2)
Alkaline Phosphatase: 42 U/L (ref 39–117)
Bilirubin, Direct: 0.1 mg/dL (ref 0.0–0.3)
Total Bilirubin: 0.6 mg/dL (ref 0.3–1.2)
Total Protein: 7.2 g/dL (ref 6.0–8.3)

## 2011-06-06 LAB — LIPID PANEL
Cholesterol: 108 mg/dL (ref 0–200)
HDL: 39.8 mg/dL (ref >39.00)
LDL Cholesterol: 45 mg/dL (ref 0–99)
Total CHOL/HDL Ratio: 3
Triglycerides: 115 mg/dL (ref 0.0–149.0)
VLDL: 23 mg/dL (ref 0.0–40.0)

## 2011-06-06 LAB — BASIC METABOLIC PANEL
BUN: 15 mg/dL (ref 6–23)
CO2: 23 mEq/L (ref 19–32)
Calcium: 9.1 mg/dL (ref 8.4–10.5)
Chloride: 104 mEq/L (ref 96–112)
Creatinine, Ser: 1.1 mg/dL (ref 0.4–1.5)
GFR: 72.74 mL/min (ref >60.00)
Glucose, Bld: 119 mg/dL — ABNORMAL HIGH (ref 70–99)
Potassium: 4 mEq/L (ref 3.5–5.1)
Sodium: 135 mEq/L (ref 135–145)

## 2011-06-06 NOTE — Assessment & Plan Note (Signed)
He remains asymptomatic. I have left him on his current medicines. I will have him see Dr. Shirlee Latch in 6 months with fasting labs on return. Patient is agreeable to this plan and will call if any problems develop in the interim.

## 2011-06-06 NOTE — Assessment & Plan Note (Signed)
Labs are checked today. He remains on statin and niaspan.

## 2011-06-06 NOTE — Patient Instructions (Addendum)
Stay on your current medicines. Watch your weight. Keep up the good work with losing weight.  Regular exercise for 45 minutes each day is recommended. Heart Healthy Diet is recommended.  You will see Dr. Marca Ancona in about 6 months. Call for any problems

## 2011-06-06 NOTE — Assessment & Plan Note (Signed)
Blood pressure looks good. I have left him on his current regimen.

## 2011-06-06 NOTE — Assessment & Plan Note (Signed)
I encouraged him to continue with his efforts at weight loss.

## 2011-06-06 NOTE — Progress Notes (Signed)
Theodore Jenkins Date of Birth: October 08, 1946   History of Present Illness: Theodore Jenkins is seen today for his 6 month visit. He is seen for Dr. Shirlee Latch. He is a former patient of Dr. Ronnald Nian. He is doing well from our standpoint. He is not having chest pain. He is not short of breath. He has been working harder on his weight and has lost 6 pounds. His wife has had recent back surgery and has been readmitted. He is tolerating his medicines. He is no longer on nitrates due to erectile dysfunction. He has tried some low dose Viagra without success. Now with his wife hospitalized, sex is not high on his priorities.   Current Outpatient Prescriptions on File Prior to Visit  Medication Sig Dispense Refill  . aspirin 325 MG tablet Take 325 mg by mouth daily.        . cetirizine (ZYRTEC) 10 MG tablet Take 10 mg by mouth as needed.        . fluticasone (FLONASE) 50 MCG/ACT nasal spray Place 2 sprays into the nose as needed.       [name] JANUMET 50-1000 MG per tablet Take 1 tablet by mouth Twice daily.      [name] loratadine (CLARITIN) 10 MG tablet Take 10 mg by mouth as needed.       . metoprolol (TOPROL XL) 50 MG 24 hr tablet Take 1 tablet (50 mg total) by mouth daily.  90 tablet  3  . niacin (NIASPAN) 1000 MG CR tablet Take 1,000 mg by mouth at bedtime.        [name] omeprazole (PRILOSEC OTC) 20 MG tablet Take 20 mg by mouth daily.        . rosuvastatin (CRESTOR) 10 MG tablet Take 10 mg by mouth daily.        . metFORMIN (GLUCOPHAGE) 1000 MG tablet Take 1,000 mg by mouth 2 (two) times daily with a meal.        . nitroGLYCERIN (NITROSTAT) 0.4 MG SL tablet Place 0.4 mg under the tongue every 5 (five) minutes as needed.          No Known Allergies  Past Medical History  Diagnosis Date  . IHD (ischemic heart disease)   . Diabetes mellitus   . Obesity   . Hypertension   . ED (erectile dysfunction)   . Hyperlipidemia   . Barrett's esophagus   . GERD (gastroesophageal reflux disease)   . Obstructive sleep apnea on  CPAP   . S/P coronary artery stent placement 2005    LAD    Past Surgical History  Procedure Date  . Cardiac catheterization 2010    NORMAL LV FUNCTION. DIFFUSE CORONARY DISEASE WITH THE DISTAL PORTION OF THE SMALL POSTERIOR DESCENDING VESSEL OCCLUDED AND FILLING WITH THE RIGHT COLLATERALS, 60-70% STENOSIS IN THE FIRST OBTUSE MARGINAL VESSEL AND DIFFUSE SMALL VESSEL DISEASE  . Circumcision   . Coronary stent placement 2005    History  Smoking status  . Former Smoker  . Quit date: 12/12/1982  Smokeless tobacco  . Never Used    History  Alcohol Use No    Family History  Problem Relation Age of Onset  . Cancer Mother   . Heart attack Father 58    Review of Systems: The review of systems is positive for erectile dysfunction. No chest pain. No shortness of breath. He is not dizzy or lightheaded.  All other systems were reviewed and are negative.  Physical Exam: BP 118/82  Pulse 70  Ht 5\' 9"  (1.753 m)  Wt 241 lb (109.317 kg)  BMI 35.59 kg/m2 Patient is very pleasant and in no acute distress. He is obese. Skin is warm and dry. Color is normal.  HEENT is unremarkable. Normocephalic/atraumatic. PERRL. Sclera are nonicteric. Neck is supple. No masses. No JVD. Lungs are clear. Cardiac exam shows a regular rate and rhythm. Abdomen is obese and soft. Extremities are without edema. Gait and ROM are intact. No gross neurologic deficits noted.  LABORATORY DATA: Pending. EKG today shows sinus rhythm with 1st degree AV block.    Assessment / Plan:

## 2011-06-07 ENCOUNTER — Encounter: Payer: Self-pay | Admitting: Nurse Practitioner

## 2011-06-07 ENCOUNTER — Telehealth: Payer: Self-pay | Admitting: *Deleted

## 2011-06-07 NOTE — Telephone Encounter (Signed)
Pt notified of lab results and to continue to work on diet, exercise, and weight loss.  Pt will have liver functions rechecked on 09/07/11 at 0830.  Pt will follow up with Dr. Shirlee Latch in December for lipid and BMET.

## 2011-06-07 NOTE — Telephone Encounter (Signed)
Message copied by Adolphus Birchwood on Tue Jun 07, 2011  9:10 AM ------      Message from: [name]      Created: Mon Jun 06, 2011  1:56 PM       Ok to report. Labs are satisfactory.  Stay on same medicines. Liver test just slightly elevated. Check HPF in 3 months. Recheck BMET/HPF/LIPIDS  in 6 months. Keep up with the diet/exercise and weight loss.

## 2011-09-07 ENCOUNTER — Other Ambulatory Visit (INDEPENDENT_AMBULATORY_CARE_PROVIDER_SITE_OTHER): Payer: 59 | Admitting: *Deleted

## 2011-09-07 ENCOUNTER — Other Ambulatory Visit: Payer: Self-pay | Admitting: *Deleted

## 2011-09-07 DIAGNOSIS — E785 Hyperlipidemia, unspecified: Secondary | ICD-10-CM

## 2011-09-07 LAB — LIPID PANEL
Cholesterol: 104 mg/dL (ref 0–200)
HDL: 37.2 mg/dL — ABNORMAL LOW (ref >39.00)
LDL Cholesterol: 41 mg/dL (ref 0–99)
Total CHOL/HDL Ratio: 3
Triglycerides: 130 mg/dL (ref 0.0–149.0)
VLDL: 26 mg/dL (ref 0.0–40.0)

## 2011-09-07 LAB — HEPATIC FUNCTION PANEL
ALT: 27 U/L (ref 0–53)
AST: 39 U/L — ABNORMAL HIGH (ref 0–37)
Albumin: 4.5 g/dL (ref 3.5–5.2)
Alkaline Phosphatase: 44 U/L (ref 39–117)
Bilirubin, Direct: 0.1 mg/dL (ref 0.0–0.3)
Total Bilirubin: 0.5 mg/dL (ref 0.3–1.2)
Total Protein: 7.2 g/dL (ref 6.0–8.3)

## 2011-09-07 LAB — BASIC METABOLIC PANEL
BUN: 16 mg/dL (ref 6–23)
CO2: 24 mEq/L (ref 19–32)
Calcium: 9 mg/dL (ref 8.4–10.5)
Chloride: 103 mEq/L (ref 96–112)
Creatinine, Ser: 1 mg/dL (ref 0.4–1.5)
GFR: 76.72 mL/min (ref >60.00)
Glucose, Bld: 124 mg/dL — ABNORMAL HIGH (ref 70–99)
Potassium: 4 mEq/L (ref 3.5–5.1)
Sodium: 138 mEq/L (ref 135–145)

## 2011-09-08 ENCOUNTER — Telehealth: Payer: Self-pay | Admitting: *Deleted

## 2011-09-08 NOTE — Telephone Encounter (Signed)
Notified wife of lab results. Will recheck labs in 6 mo. 

## 2011-09-08 NOTE — Telephone Encounter (Signed)
Message copied by Lorayne Bender on Thu Sep 08, 2011  4:24 PM ------      Message from: [name]      Created: Wed Sep 07, 2011  3:11 PM       Ok to report. Labs are satisfactory.  Stay on same medicines. Recheck BMET/HPF/LIPIDS on return visit.

## 2011-10-10 ENCOUNTER — Other Ambulatory Visit: Payer: Self-pay | Admitting: Cardiology

## 2012-01-24 ENCOUNTER — Other Ambulatory Visit: Payer: Self-pay | Admitting: Nurse Practitioner

## 2012-03-20 ENCOUNTER — Ambulatory Visit (INDEPENDENT_AMBULATORY_CARE_PROVIDER_SITE_OTHER): Payer: 59 | Admitting: Nurse Practitioner

## 2012-03-20 ENCOUNTER — Telehealth: Payer: Self-pay | Admitting: *Deleted

## 2012-03-20 ENCOUNTER — Encounter: Payer: Self-pay | Admitting: Nurse Practitioner

## 2012-03-20 VITALS — BP 122/82 | HR 68 | Ht 69.0 in | Wt 247.0 lb

## 2012-03-20 DIAGNOSIS — I2510 Atherosclerotic heart disease of native coronary artery without angina pectoris: Secondary | ICD-10-CM

## 2012-03-20 DIAGNOSIS — E785 Hyperlipidemia, unspecified: Secondary | ICD-10-CM

## 2012-03-20 DIAGNOSIS — I10 Essential (primary) hypertension: Secondary | ICD-10-CM

## 2012-03-20 DIAGNOSIS — N529 Male erectile dysfunction, unspecified: Secondary | ICD-10-CM

## 2012-03-20 LAB — BASIC METABOLIC PANEL
BUN: 15 mg/dL (ref 6–23)
CO2: 23 mEq/L (ref 19–32)
Calcium: 9.1 mg/dL (ref 8.4–10.5)
Chloride: 103 mEq/L (ref 96–112)
Creatinine, Ser: 1.1 mg/dL (ref 0.4–1.5)
GFR: 70.31 mL/min (ref >60.00)
Glucose, Bld: 134 mg/dL — ABNORMAL HIGH (ref 70–99)
Potassium: 4.1 mEq/L (ref 3.5–5.1)
Sodium: 136 mEq/L (ref 135–145)

## 2012-03-20 LAB — LIPID PANEL
Cholesterol: 114 mg/dL (ref 0–200)
HDL: 39 mg/dL — ABNORMAL LOW (ref >39.00)
LDL Cholesterol: 55 mg/dL (ref 0–99)
Total CHOL/HDL Ratio: 3
Triglycerides: 100 mg/dL (ref 0.0–149.0)
VLDL: 20 mg/dL (ref 0.0–40.0)

## 2012-03-20 LAB — HEPATIC FUNCTION PANEL
ALT: 25 U/L (ref 0–53)
AST: 37 U/L (ref 0–37)
Albumin: 4.7 g/dL (ref 3.5–5.2)
Alkaline Phosphatase: 45 U/L (ref 39–117)
Bilirubin, Direct: 0.1 mg/dL (ref 0.0–0.3)
Total Bilirubin: 0.8 mg/dL (ref 0.3–1.2)
Total Protein: 7.7 g/dL (ref 6.0–8.3)

## 2012-03-20 MED ORDER — NITROGLYCERIN 0.4 MG SL SUBL: 0.4000 mg | SUBLINGUAL_TABLET | SUBLINGUAL | Status: DC | PRN

## 2012-03-20 MED ORDER — SILDENAFIL CITRATE 100 MG PO TABS: 100.0000 mg | ORAL_TABLET | Freq: Every day | ORAL | Status: DC | PRN

## 2012-03-20 NOTE — Patient Instructions (Addendum)
I think you are doing well.  Stay active  We are going to check your labs today.  I have refilled your NTG and your Viagra  You may cut your Niaspan in half for the next 3 months and then we will recheck your labs  We will see you back in 6 months.  Call the [facility] office at [phone] if you have any questions, problems or concerns.

## 2012-03-20 NOTE — Assessment & Plan Note (Signed)
He has always struggled with weight loss. Encouraged to keep active and try to increase his exercise habit since his wife has improved.

## 2012-03-20 NOTE — Assessment & Plan Note (Signed)
We are rechecking lipids today. He does want to try half dose of his Niaspan for 3 months. Will recheck his lipids with an A1C in 3 months. He reports his current AIC is 7.3.

## 2012-03-20 NOTE — Assessment & Plan Note (Signed)
He is doing well. No cardiac complaints. NTG is refilled today. Encouraged him to stay active. Will see him back in 6 months. Patient is agreeable to this plan and will call if any problems develop in the interim.

## 2012-03-20 NOTE — Assessment & Plan Note (Signed)
He would like to try Viagra again. Prescription is refilled today.

## 2012-03-20 NOTE — Telephone Encounter (Signed)
Message copied by Royanne Foots on Tue Mar 20, 2012  4:30 PM ------      Message from: [name]      Created: Tue Mar 20, 2012  1:24 PM       Ok to report. Labs are satisfactory. Will cut his Niaspan back to just half for 3 months and then plan on rechecking. Should have repeat labs already ordered for in 3 months.

## 2012-03-20 NOTE — Telephone Encounter (Signed)
ADVISED PATIENT OF RESULTS AND GAVE LAB APPOINTMENT

## 2012-03-20 NOTE — Assessment & Plan Note (Signed)
Blood pressure looks good. No change with his current regimen.

## 2012-03-20 NOTE — Progress Notes (Signed)
Theodore Jenkins Date of Birth: 03-22-1946 Medical Record #[id number]  History of Present Illness: Theodore Jenkins is seen back today for a follow up visit. He is seen for Dr. Shirlee Latch. He is a former patient of Dr. Ronnald Nian. He has known CAD with remote stenting of the LAD in 2005. Last cath in 2010 showing diffuse CAD. He has been managed medically. Other problems include obesity, HLD, HTN and DM.  He comes in today. He is here alone. He has been doing well. Trying to stay active. Has been caring for his wife who had a prolonged recovery from back surgery. No chest pain. Not short of breath. Blood pressure is doing well. He feels good. He notes that his medical doctor wants him on less Niaspan due to his increasing blood sugar. Current A1C is 7.3.   Current Outpatient Prescriptions on File Prior to Visit  Medication Sig Dispense Refill  . aspirin 325 MG tablet Take 325 mg by mouth daily.        . cetirizine (ZYRTEC) 10 MG tablet Take 10 mg by mouth as needed.        . CRESTOR 10 MG tablet TAKE 1 TABLET DAILY  90 tablet  1  . fluticasone (FLONASE) 50 MCG/ACT nasal spray Place 2 sprays into the nose as needed.       [name] JANUMET 50-1000 MG per tablet Take 1 tablet by mouth Twice daily.      [name] loratadine (CLARITIN) 10 MG tablet Take 10 mg by mouth as needed.       . metFORMIN (GLUCOPHAGE) 1000 MG tablet Take 1,000 mg by mouth 2 (two) times daily with a meal.        . metoprolol (TOPROL XL) 50 MG 24 hr tablet Take 1 tablet (50 mg total) by mouth daily.  90 tablet  3  . NIASPAN 500 MG CR tablet TAKE 2 TABLETS DAILY AT NIGHT  180 tablet  1  . nitroGLYCERIN (NITROSTAT) 0.4 MG SL tablet Place 0.4 mg under the tongue every 5 (five) minutes as needed.        [name] omeprazole (PRILOSEC OTC) 20 MG tablet Take 20 mg by mouth daily.        [name] DISCONTD: niacin (NIASPAN) 1000 MG CR tablet Take 1,000 mg by mouth at bedtime.          No Known Allergies  Past Medical History  Diagnosis Date  . IHD (ischemic heart disease)       prior stenting of the LAD in December of 2005; with last cath in 2010 showing diffuse CAD with normal LV function. He is managed medically.   . Diabetes mellitus   . Obesity   . Hypertension   . ED (erectile dysfunction)   . Hyperlipidemia   . Barrett's esophagus   . GERD (gastroesophageal reflux disease)   . Obstructive sleep apnea on CPAP   . S/P coronary artery stent placement 2005    LAD    Past Surgical History  Procedure Date  . Cardiac catheterization 2010    NORMAL LV FUNCTION. DIFFUSE CORONARY DISEASE WITH THE DISTAL PORTION OF THE SMALL POSTERIOR DESCENDING VESSEL OCCLUDED AND FILLING WITH THE RIGHT COLLATERALS, 60-70% STENOSIS IN THE FIRST OBTUSE MARGINAL VESSEL AND DIFFUSE SMALL VESSEL DISEASE  . Circumcision   . Coronary stent placement 2005    History  Smoking status  . Former Smoker  . Quit date: 12/12/1982  Smokeless tobacco  . Never Used    History  Alcohol Use  No    Family History  Problem Relation Age of Onset  . Cancer Mother   . Heart attack Father 93    Review of Systems: The review of systems is per the HPI. His weight continues to fluctuate.  He does have ED and is interested in trying Viagra now that his wife's health is improving. All other systems were reviewed and are negative.  Physical Exam: BP 122/82  Pulse 68  Ht 5\' 9"  (1.753 m)  Wt 247 lb (112.038 kg)  BMI 36.48 kg/m2 Patient is very pleasant and in no acute distress. Skin is warm and dry. Color is normal.  HEENT is unremarkable. Normocephalic/atraumatic. PERRL. Sclera are nonicteric. Neck is supple. No masses. No JVD. Lungs are clear. Cardiac exam shows a regular rate and rhythm. Abdomen is obese but soft. Extremities are without edema. Gait and ROM are intact. No gross neurologic deficits noted.  LABORATORY DATA:   Lab Results  Component Value Date   GLUCOSE 124* 09/07/2011   CHOL 104 09/07/2011   TRIG 130.0 09/07/2011   HDL 37.20* 09/07/2011   LDLCALC 41 09/07/2011   ALT  27 09/07/2011   AST 39* 09/07/2011   NA 138 09/07/2011   K 4.0 09/07/2011   CL 103 09/07/2011   CREATININE 1.0 09/07/2011   BUN 16 09/07/2011   CO2 24 09/07/2011    Assessment / Plan:

## 2012-03-21 ENCOUNTER — Telehealth: Payer: Self-pay | Admitting: Nurse Practitioner

## 2012-03-21 NOTE — Telephone Encounter (Signed)
New msg [facility] has drug therapy question about nitroglycerin and viagra Please call with ref number [id number]

## 2012-03-22 ENCOUNTER — Telehealth: Payer: Self-pay

## 2012-03-22 NOTE — Telephone Encounter (Signed)
Received phone call from pharmacist at [facility] wanting to verify is patient aware of not using NTG when taking viagra.Patient is aware not to use NTG when taking viagra.

## 2012-03-26 NOTE — Telephone Encounter (Signed)
[facility] already called 03/22/12.

## 2012-04-20 ENCOUNTER — Other Ambulatory Visit: Payer: Self-pay | Admitting: Cardiology

## 2012-04-20 ENCOUNTER — Other Ambulatory Visit: Payer: Self-pay | Admitting: Nurse Practitioner

## 2012-06-19 ENCOUNTER — Ambulatory Visit (INDEPENDENT_AMBULATORY_CARE_PROVIDER_SITE_OTHER): Payer: 59 | Admitting: *Deleted

## 2012-06-19 DIAGNOSIS — E119 Type 2 diabetes mellitus without complications: Secondary | ICD-10-CM

## 2012-06-19 DIAGNOSIS — I10 Essential (primary) hypertension: Secondary | ICD-10-CM

## 2012-06-19 DIAGNOSIS — E785 Hyperlipidemia, unspecified: Secondary | ICD-10-CM

## 2012-06-19 LAB — LIPID PANEL
Cholesterol: 112 mg/dL (ref 0–200)
HDL: 41.7 mg/dL (ref >39.00)
LDL Cholesterol: 45 mg/dL (ref 0–99)
Total CHOL/HDL Ratio: 3
Triglycerides: 127 mg/dL (ref 0.0–149.0)
VLDL: 25.4 mg/dL (ref 0.0–40.0)

## 2012-06-19 LAB — HEPATIC FUNCTION PANEL
ALT: 22 U/L (ref 0–53)
AST: 34 U/L (ref 0–37)
Albumin: 4.6 g/dL (ref 3.5–5.2)
Alkaline Phosphatase: 43 U/L (ref 39–117)
Bilirubin, Direct: 0 mg/dL (ref 0.0–0.3)
Total Bilirubin: 0.9 mg/dL (ref 0.3–1.2)
Total Protein: 7.8 g/dL (ref 6.0–8.3)

## 2012-06-19 LAB — HEMOGLOBIN A1C: Hgb A1c MFr Bld: 7.1 % — ABNORMAL HIGH (ref 4.6–6.5)

## 2012-07-12 ENCOUNTER — Other Ambulatory Visit: Payer: Self-pay | Admitting: *Deleted

## 2012-07-12 MED ORDER — NIACIN ER (ANTIHYPERLIPIDEMIC) 500 MG PO TBCR: 500.0000 mg | EXTENDED_RELEASE_TABLET | Freq: Every day | ORAL | Status: DC

## 2012-07-12 MED ORDER — ROSUVASTATIN CALCIUM 10 MG PO TABS: 10.0000 mg | ORAL_TABLET | Freq: Every day | ORAL | Status: DC

## 2013-04-05 ENCOUNTER — Encounter: Payer: Self-pay | Admitting: Cardiology

## 2013-04-19 ENCOUNTER — Other Ambulatory Visit: Payer: Self-pay | Admitting: *Deleted

## 2013-04-19 MED ORDER — METOPROLOL SUCCINATE ER 50 MG PO TB24: 50.0000 mg | ORAL_TABLET | Freq: Every day | ORAL | Status: DC

## 2013-05-07 ENCOUNTER — Encounter: Payer: Self-pay | Admitting: Nurse Practitioner

## 2013-05-07 ENCOUNTER — Ambulatory Visit (INDEPENDENT_AMBULATORY_CARE_PROVIDER_SITE_OTHER): Payer: 59 | Admitting: Nurse Practitioner

## 2013-05-07 VITALS — BP 160/84 | HR 60 | Ht 69.0 in | Wt 247.6 lb

## 2013-05-07 DIAGNOSIS — I2510 Atherosclerotic heart disease of native coronary artery without angina pectoris: Secondary | ICD-10-CM

## 2013-05-07 DIAGNOSIS — R011 Cardiac murmur, unspecified: Secondary | ICD-10-CM

## 2013-05-07 LAB — LIPID PANEL
Cholesterol: 105 mg/dL (ref 0–200)
HDL: 35.8 mg/dL — ABNORMAL LOW (ref >39.00)
LDL Cholesterol: 47 mg/dL (ref 0–99)
Total CHOL/HDL Ratio: 3
Triglycerides: 110 mg/dL (ref 0.0–149.0)
VLDL: 22 mg/dL (ref 0.0–40.0)

## 2013-05-07 LAB — CBC WITH DIFFERENTIAL/PLATELET
Basophils Absolute: 0 10*3/uL (ref 0.0–0.1)
Basophils Relative: 0.2 % (ref 0.0–3.0)
Eosinophils Absolute: 0.1 10*3/uL (ref 0.0–0.7)
Eosinophils Relative: 1.6 % (ref 0.0–5.0)
HCT: 43.7 % (ref 39.0–52.0)
Hemoglobin: 14.8 g/dL (ref 13.0–17.0)
Lymphocytes Relative: 18 % (ref 12.0–46.0)
Lymphs Abs: 1.1 10*3/uL (ref 0.7–4.0)
MCHC: 33.8 g/dL (ref 30.0–36.0)
MCV: 95.6 fl (ref 78.0–100.0)
Monocytes Absolute: 0.9 10*3/uL (ref 0.1–1.0)
Monocytes Relative: 15.3 % — ABNORMAL HIGH (ref 3.0–12.0)
Neutro Abs: 4 10*3/uL (ref 1.4–7.7)
Neutrophils Relative %: 64.9 % (ref 43.0–77.0)
Platelets: 160 10*3/uL (ref 150.0–400.0)
RBC: 4.57 Mil/uL (ref 4.22–5.81)
RDW: 13.8 % (ref 11.5–14.6)
WBC: 6.2 10*3/uL (ref 4.5–10.5)

## 2013-05-07 LAB — HEPATIC FUNCTION PANEL
ALT: 45 U/L (ref 0–53)
AST: 88 U/L — ABNORMAL HIGH (ref 0–37)
Albumin: 4.4 g/dL (ref 3.5–5.2)
Alkaline Phosphatase: 35 U/L — ABNORMAL LOW (ref 39–117)
Bilirubin, Direct: 0 mg/dL (ref 0.0–0.3)
Total Bilirubin: 0.4 mg/dL (ref 0.3–1.2)
Total Protein: 7.5 g/dL (ref 6.0–8.3)

## 2013-05-07 LAB — HEMOGLOBIN A1C: Hgb A1c MFr Bld: 7.2 % — ABNORMAL HIGH (ref 4.6–6.5)

## 2013-05-07 LAB — BASIC METABOLIC PANEL
BUN: 18 mg/dL (ref 6–23)
CO2: 22 mEq/L (ref 19–32)
Calcium: 9.2 mg/dL (ref 8.4–10.5)
Chloride: 104 mEq/L (ref 96–112)
Creatinine, Ser: 1.1 mg/dL (ref 0.4–1.5)
GFR: 73.06 mL/min (ref >60.00)
Glucose, Bld: 125 mg/dL — ABNORMAL HIGH (ref 70–99)
Potassium: 4.2 mEq/L (ref 3.5–5.1)
Sodium: 135 mEq/L (ref 135–145)

## 2013-05-07 MED ORDER — LISINOPRIL 10 MG PO TABS: 10.0000 mg | ORAL_TABLET | Freq: Every day | ORAL | Status: DC

## 2013-05-07 MED ORDER — METOPROLOL SUCCINATE ER 50 MG PO TB24: 50.0000 mg | ORAL_TABLET | Freq: Every day | ORAL | Status: DC

## 2013-05-07 NOTE — Patient Instructions (Addendum)
Stay on your current medicines except for adding Lisinopril 10 mg a day - this is to help your blood pressure. Take the first tablet tonight and then one each day  Monitor your blood pressure  We are going to get an echocardiogram of your heart  We are going to update your stress test  I need to see you in 3 weeks  We will check fasting labs today  Call the [facility] office at [phone] if you have any questions, problems or concerns.

## 2013-05-07 NOTE — Progress Notes (Addendum)
Theodore Jenkins Date of Birth: 29-Jan-1946 Medical Record #[id number]  History of Present Illness: Theodore Jenkins is seen back today for a one year check. Seen for Dr. Shirlee Latch. Former patient of Dr. Ronnald Nian. Has known CAD with remote stenting of the LAD in 2005. Last cath in 2010 showing diffuse CAD. Managed medically. Other problems include obesity, HLD, HTN, ED and DM. He has had past right carotid endarterectomy with subsequent complete closure in the past. Previously seen by Dr. Madilyn Fireman with [facility].   Comes in today. He is here alone. Was last seen a year ago. Has noted over the past few months that he is "slowing down". Not able to walk as far on his treadmill. More fatigued. No chest pain and not short of breath. Sugars are running high. BP is running high here and at home. Taking his medicines but has stopped his Niaspan - does not feel it really does him any good. No recent labs.    Current Outpatient Prescriptions on File Prior to Visit  Medication Sig Dispense Refill  . aspirin 325 MG tablet Take 325 mg by mouth daily.        . cetirizine (ZYRTEC) 10 MG tablet Take 10 mg by mouth as needed.        . fluticasone (FLONASE) 50 MCG/ACT nasal spray Place 2 sprays into the nose as needed.       [name] JANUMET 50-1000 MG per tablet Take 1 tablet by mouth Twice daily.      [name] loratadine (CLARITIN) 10 MG tablet Take 10 mg by mouth as needed.       . metFORMIN (GLUCOPHAGE) 1000 MG tablet Take 1,000 mg by mouth 2 (two) times daily with a meal.        . nitroGLYCERIN (NITROSTAT) 0.4 MG SL tablet Place 1 tablet (0.4 mg total) under the tongue every 5 (five) minutes as needed.  100 tablet  3  . omeprazole (PRILOSEC OTC) 20 MG tablet Take 20 mg by mouth daily.        . rosuvastatin (CRESTOR) 10 MG tablet Take 1 tablet (10 mg total) by mouth daily.  90 tablet  1  . sildenafil (VIAGRA) 100 MG tablet Take 1 tablet (100 mg total) by mouth daily as needed for erectile dysfunction.  10 tablet  3   No current  facility-administered medications on file prior to visit.    No Known Allergies  Past Medical History  Diagnosis Date  . IHD (ischemic heart disease)     prior stenting of the LAD in December of 2005; with last cath in 2010 showing diffuse CAD with normal LV function. He is managed medically.   . Diabetes mellitus   . Obesity   . Hypertension   . ED (erectile dysfunction)   . Hyperlipidemia   . Barrett's esophagus   . GERD (gastroesophageal reflux disease)   . Obstructive sleep apnea on CPAP   . S/P coronary artery stent placement 2005    LAD    Past Surgical History  Procedure Laterality Date  . Cardiac catheterization  2010    NORMAL LV FUNCTION. DIFFUSE CORONARY DISEASE WITH THE DISTAL PORTION OF THE SMALL POSTERIOR DESCENDING VESSEL OCCLUDED AND FILLING WITH THE RIGHT COLLATERALS, 60-70% STENOSIS IN THE FIRST OBTUSE MARGINAL VESSEL AND DIFFUSE SMALL VESSEL DISEASE  . Circumcision    . Coronary stent placement  2005    History  Smoking status  . Former Smoker  . Quit date: 12/12/1982  Smokeless tobacco  .  Never Used    History  Alcohol Use No    Family History  Problem Relation Age of Onset  . Cancer Mother   . Heart attack Father 50    Review of Systems: The review of systems is per the HPI.  All other systems were reviewed and are negative.  Physical Exam: BP 160/84  Pulse 60  Ht 5\' 9"  (1.753 m)  Wt 247 lb 9.6 oz (112.311 kg)  BMI 36.55 kg/m2 Patient is very pleasant and in no acute distress. Weight is unchanged from last visit. He remains obese. Skin is warm and dry. Color is normal.  HEENT is unremarkable. Normocephalic/atraumatic. PERRL. Sclera are nonicteric. Neck is supple. No masses. No JVD. Lungs are clear. Cardiac exam shows a regular rate and rhythm. He has a definite 2/6 outflow murmur today. Abdomen is obese but soft. Extremities are without edema. Gait and ROM are intact. No gross neurologic deficits noted.  LABORATORY DATA: Pending for  today  Lab Results  Component Value Date   GLUCOSE 134* 03/20/2012   CHOL 112 06/19/2012   TRIG 127.0 06/19/2012   HDL 41.70 06/19/2012   LDLCALC 45 06/19/2012   ALT 22 06/19/2012   AST 34 06/19/2012   NA 136 03/20/2012   K 4.1 03/20/2012   CL 103 03/20/2012   CREATININE 1.1 03/20/2012   BUN 15 03/20/2012   CO2 23 03/20/2012   HGBA1C 7.1* 06/19/2012    Assessment / Plan: 1. CAD - known diffuse 3 vessel disease - now reporting symptoms of decreased endurance/fatigue. Will update his Myoview.   2. Murmur - needs an echo - may have AS.   3. HLD - checking labs today. He is no longer on his Niaspan.  4. HTN - BP is up here and at home. I have added Lisinopril 10 mg a day.  I will see him back in about 3 weeks. Will update the Myoview and arrange for the echo. He will monitor his BP at home. Further disposition to follow.   Patient is agreeable to this plan and will call if any problems develop in the interim.   [name], RN, ANP-C [facility] [address] [place], [place]  [address]  Addendum: Was here today for his Myoview. Protocol here is to change to Lexiscan due to his known carotid occlusion on the right.  I believe he has his carotid follow up in [facility] but will need to clarify when I see him back.   LCG

## 2013-05-13 ENCOUNTER — Ambulatory Visit (HOSPITAL_COMMUNITY): Payer: 59 | Attending: Cardiology | Admitting: Radiology

## 2013-05-13 DIAGNOSIS — I2510 Atherosclerotic heart disease of native coronary artery without angina pectoris: Secondary | ICD-10-CM | POA: Insufficient documentation

## 2013-05-13 DIAGNOSIS — R011 Cardiac murmur, unspecified: Secondary | ICD-10-CM

## 2013-05-13 NOTE — Progress Notes (Signed)
Echocardiogram performed.  

## 2013-05-14 ENCOUNTER — Ambulatory Visit (HOSPITAL_COMMUNITY): Payer: 59 | Attending: Nurse Practitioner | Admitting: Radiology

## 2013-05-14 VITALS — BP 112/68 | HR 57 | Ht 69.0 in | Wt 240.0 lb

## 2013-05-14 DIAGNOSIS — I739 Peripheral vascular disease, unspecified: Secondary | ICD-10-CM | POA: Insufficient documentation

## 2013-05-14 DIAGNOSIS — I10 Essential (primary) hypertension: Secondary | ICD-10-CM | POA: Insufficient documentation

## 2013-05-14 DIAGNOSIS — I2510 Atherosclerotic heart disease of native coronary artery without angina pectoris: Secondary | ICD-10-CM

## 2013-05-14 DIAGNOSIS — Z8249 Family history of ischemic heart disease and other diseases of the circulatory system: Secondary | ICD-10-CM | POA: Insufficient documentation

## 2013-05-14 DIAGNOSIS — R5383 Other fatigue: Secondary | ICD-10-CM | POA: Insufficient documentation

## 2013-05-14 DIAGNOSIS — I779 Disorder of arteries and arterioles, unspecified: Secondary | ICD-10-CM | POA: Insufficient documentation

## 2013-05-14 DIAGNOSIS — R5381 Other malaise: Secondary | ICD-10-CM | POA: Insufficient documentation

## 2013-05-14 DIAGNOSIS — E785 Hyperlipidemia, unspecified: Secondary | ICD-10-CM | POA: Insufficient documentation

## 2013-05-14 DIAGNOSIS — E669 Obesity, unspecified: Secondary | ICD-10-CM | POA: Insufficient documentation

## 2013-05-14 DIAGNOSIS — Z87891 Personal history of nicotine dependence: Secondary | ICD-10-CM | POA: Insufficient documentation

## 2013-05-14 MED ORDER — TECHNETIUM TC 99M SESTAMIBI GENERIC - CARDIOLITE
10.0000 | Freq: Once | INTRAVENOUS | Status: AC | PRN
  Administered 2013-05-14: 10 via INTRAVENOUS

## 2013-05-14 MED ORDER — REGADENOSON 0.4 MG/5ML IV SOLN
0.4000 mg | Freq: Once | INTRAVENOUS | Status: AC
  Administered 2013-05-14: 0.4 mg via INTRAVENOUS

## 2013-05-14 MED ORDER — TECHNETIUM TC 99M SESTAMIBI GENERIC - CARDIOLITE
30.0000 | Freq: Once | INTRAVENOUS | Status: AC | PRN
  Administered 2013-05-14: 30 via INTRAVENOUS

## 2013-05-14 NOTE — Progress Notes (Signed)
  [facility] SITE 3 NUCLEAR MED [address] [place], [place] [address] [phone]    Cardiology Nuclear Med Study  Theodore Jenkins is a 67 y.o. male     MRN : [id number]     DOB: [birth year]  Procedure Date: 05/14/2013  Nuclear Med Background Indication for Stress Test:  Evaluation for Ischemia and Stent Patency History:  '05 Stent-LAD; '10 [id number], EF=64%>Cath:occluded PDA with collaterals, ; 05/13/13 Echo:EF=65%, moderate AS Cardiac Risk Factors: Carotid Disease, Family History - CAD, History of Smoking, Hypertension, Lipids, Obesity and PVD  Symptoms:  Fatigue   Nuclear Pre-Procedure Caffeine/Decaff Intake:  7:00pm NPO After: 7:00pm   Lungs:  Clear. O2 Sat: 98% on room air. IV 0.9% NS with Angio Cath:  22g  IV Site: R Hand  IV Started by:  Doyne Keel, CNMT  Chest Size (in):  44 Cup Size: n/a  Height: 5\' 9"  (1.753 m)  Weight:  240 lb (108.863 kg)  BMI:  Body mass index is 35.43 kg/(m^2). Tech Comments:  Toprol held x 11 hours    Nuclear Med Study 1 or 2 day study: 1 day  Stress Test Type:  Lexiscan  Reading MD: Willa Rough, MD  Order Authorizing Provider:  Marca Ancona, MD  Resting Radionuclide: Technetium 50m Sestamibi  Resting Radionuclide Dose: 11.0 mCi   Stress Radionuclide:  Technetium 52m Sestamibi  Stress Radionuclide Dose: 33.0 mCi           Stress Protocol Rest HR: 57 Stress HR: 67  Rest BP: 112/68 Stress BP: 110/70  Exercise Time (min): n/a METS: n/a           Dose of Adenosine (mg):  n/a Dose of Lexiscan: 0.4 mg  Dose of Atropine (mg): n/a Dose of Dobutamine: n/a mcg/kg/min (at max HR)  Stress Test Technologist: Smiley Houseman, CMA-N  Nuclear Technologist:  Domenic Polite, CNMT     Rest Procedure:  Myocardial perfusion imaging was performed at rest 45 minutes following the intravenous administration of Technetium 23m Sestamibi.  Rest ECG: Sinus bradycardia with no QRS abnormality  Stress Procedure:  The patient received IV  Lexiscan 0.4 mg over 15-seconds.  Technetium 64m Sestamibi injected at 30-seconds.  Quantitative spect images were obtained after a 45 minute delay.  Stress ECG: No significant change from baseline ECG  QPS Raw Data Images:  Patient motion noted; appropriate software correction applied. Stress Images:  Normal homogeneous uptake in all areas of the myocardium. Rest Images:  Normal homogeneous uptake in all areas of the myocardium. Subtraction (SDS):  No evidence of ischemia. Transient Ischemic Dilatation (Normal <1.22):  1.16 Lung/Heart Ratio (Normal <0.45):  0.39  Quantitative Gated Spect Images QGS EDV:  124 ml QGS ESV:  59 ml  Impression Exercise Capacity:  Lexiscan with no exercise. BP Response:  Normal blood pressure response. Clinical Symptoms:  shortness of breath ECG Impression:  No significant ST segment change suggestive of ischemia. Comparison with Prior Nuclear Study: The prior study of August, 2010 was reported as showing some inferior ischemia. This is no longer seen.  Overall Impression:  There is no definite scar or ischemia. This is a low risk scan.  LV Ejection Fraction: 52%.  LV Wall Motion:  Low normal ejection fraction.  Willa Rough, MD

## 2013-05-28 ENCOUNTER — Encounter: Payer: Self-pay | Admitting: Nurse Practitioner

## 2013-05-28 ENCOUNTER — Ambulatory Visit (INDEPENDENT_AMBULATORY_CARE_PROVIDER_SITE_OTHER): Payer: 59 | Admitting: Nurse Practitioner

## 2013-05-28 VITALS — BP 130/66 | HR 60 | Ht 69.0 in | Wt 241.8 lb

## 2013-05-28 DIAGNOSIS — E785 Hyperlipidemia, unspecified: Secondary | ICD-10-CM

## 2013-05-28 DIAGNOSIS — I10 Essential (primary) hypertension: Secondary | ICD-10-CM

## 2013-05-28 DIAGNOSIS — I350 Nonrheumatic aortic (valve) stenosis: Secondary | ICD-10-CM

## 2013-05-28 DIAGNOSIS — I2510 Atherosclerotic heart disease of native coronary artery without angina pectoris: Secondary | ICD-10-CM

## 2013-05-28 DIAGNOSIS — I359 Nonrheumatic aortic valve disorder, unspecified: Secondary | ICD-10-CM

## 2013-05-28 LAB — BASIC METABOLIC PANEL
BUN: 17 mg/dL (ref 6–23)
CO2: 22 mEq/L (ref 19–32)
Calcium: 9.8 mg/dL (ref 8.4–10.5)
Chloride: 105 mEq/L (ref 96–112)
Creatinine, Ser: 1.2 mg/dL (ref 0.4–1.5)
GFR: 65.95 mL/min (ref >60.00)
Glucose, Bld: 88 mg/dL (ref 70–99)
Potassium: 3.9 mEq/L (ref 3.5–5.1)
Sodium: 136 mEq/L (ref 135–145)

## 2013-05-28 LAB — HEPATIC FUNCTION PANEL
ALT: 26 U/L (ref 0–53)
AST: 33 U/L (ref 0–37)
Albumin: 4.5 g/dL (ref 3.5–5.2)
Alkaline Phosphatase: 38 U/L — ABNORMAL LOW (ref 39–117)
Bilirubin, Direct: 0.1 mg/dL (ref 0.0–0.3)
Total Bilirubin: 0.8 mg/dL (ref 0.3–1.2)
Total Protein: 7.4 g/dL (ref 6.0–8.3)

## 2013-05-28 NOTE — Progress Notes (Signed)
Theodore Jenkins Date of Birth: 1946/11/13 Medical Record #[id number]  History of Present Illness: Theodore Jenkins is seen back today for a follow up visit. Seen for Dr. Shirlee Jenkins. Former patient of Dr. Ronnald Jenkins. Has known CAD with remote stenting of the LAD in 2005. Last cath in 2010 showing diffuse CAD. Managed medically. Other problems include obesity, HLD, HTN, ED and DM. He has had past right carotid endarterectomy with subsequent complete closure in the past. Previously seen by Dr. Madilyn Jenkins with [facility] and now followed by Vascular in [facility].   Seen 3 weeks ago with complaints of "slowing down". More fatigued. BP was up. New murmur noted on exam. We arranged for stress testing and echo.   Comes back today. He is here with his wife. Doing ok. Has been more active. His wife has been more vigilant about his diet. No "lemon oreos" in the house anymore. He is losing weight. Blood sugar has also been lower. No chest pain, not short of breath and not lightheaded or dizzy. BP is better at home and good here today. Tolerating his medicines well.   Current Outpatient Prescriptions on File Prior to Visit  Medication Sig Dispense Refill  . aspirin 325 MG tablet Take 325 mg by mouth daily.        . cetirizine (ZYRTEC) 10 MG tablet Take 10 mg by mouth as needed.        . fluticasone (FLONASE) 50 MCG/ACT nasal spray Place 2 sprays into the nose as needed.       [name] JANUMET 50-1000 MG per tablet Take 1 tablet by mouth Twice daily.      [name] lisinopril (PRINIVIL,ZESTRIL) 10 MG tablet Take 1 tablet (10 mg total) by mouth daily.  30 tablet  3  . metFORMIN (GLUCOPHAGE) 1000 MG tablet Take 1,000 mg by mouth 2 (two) times daily with a meal.        . metoprolol succinate (TOPROL-XL) 50 MG 24 hr tablet Take 1 tablet (50 mg total) by mouth daily. Take with or immediately following a meal.  90 tablet  3  . nitroGLYCERIN (NITROSTAT) 0.4 MG SL tablet Place 1 tablet (0.4 mg total) under the tongue every 5 (five) minutes as needed.  100  tablet  3  . omeprazole (PRILOSEC OTC) 20 MG tablet Take 20 mg by mouth daily.        . rosuvastatin (CRESTOR) 10 MG tablet Take 1 tablet (10 mg total) by mouth daily.  90 tablet  1  . sildenafil (VIAGRA) 100 MG tablet Take 1 tablet (100 mg total) by mouth daily as needed for erectile dysfunction.  10 tablet  3   No current facility-administered medications on file prior to visit.    No Known Allergies  Past Medical History  Diagnosis Date  . IHD (ischemic heart disease)     prior stenting of the LAD in December of 2005; with last cath in 2010 showing diffuse CAD with normal LV function. He is managed medically.   . Diabetes mellitus   . Obesity   . Hypertension   . ED (erectile dysfunction)   . Hyperlipidemia   . Barrett's esophagus   . GERD (gastroesophageal reflux disease)   . Obstructive sleep apnea on CPAP   . S/P coronary artery stent placement 2005    LAD    Past Surgical History  Procedure Laterality Date  . Cardiac catheterization  2010    NORMAL LV FUNCTION. DIFFUSE CORONARY DISEASE WITH THE DISTAL PORTION OF THE  SMALL POSTERIOR DESCENDING VESSEL OCCLUDED AND FILLING WITH THE RIGHT COLLATERALS, 60-70% STENOSIS IN THE FIRST OBTUSE MARGINAL VESSEL AND DIFFUSE SMALL VESSEL DISEASE  . Circumcision    . Coronary stent placement  2005    History  Smoking status  . Former Smoker  . Quit date: 12/12/1982  Smokeless tobacco  . Never Used    History  Alcohol Use No    Family History  Problem Relation Age of Onset  . Cancer Mother   . Heart attack Father 66    Review of Systems: The review of systems is per the HPI.  All other systems were reviewed and are negative.  Physical Exam: BP 130/66  Pulse 60  Ht 5\' 9"  (1.753 m)  Wt 241 lb 12.8 oz (109.68 kg)  BMI 35.69 kg/m2 Patient is very pleasant and in no acute distress. He is obese - but his weight is down 6 pounds over the past 3 weeks. Skin is warm and dry. Color is normal.  HEENT is unremarkable.  Normocephalic/atraumatic. PERRL. Sclera are nonicteric. Neck is supple. No masses. No JVD. Lungs are clear. Cardiac exam shows a regular rate and rhythm. Outflow murmur noted. Abdomen is soft and obese. Extremities are without edema. Gait and ROM are intact. No gross neurologic deficits noted.  LABORATORY DATA: BMET & HPF are pending for today.  Lab Results  Component Value Date   WBC 6.2 05/07/2013   HGB 14.8 05/07/2013   HCT 43.7 05/07/2013   PLT 160.0 05/07/2013   GLUCOSE 125* 05/07/2013   CHOL 105 05/07/2013   TRIG 110.0 05/07/2013   HDL 35.80* 05/07/2013   LDLCALC 47 05/07/2013   ALT 45 05/07/2013   AST 88* 05/07/2013   NA 135 05/07/2013   K 4.2 05/07/2013   CL 104 05/07/2013   CREATININE 1.1 05/07/2013   BUN 18 05/07/2013   CO2 22 05/07/2013   HGBA1C 7.2* 05/07/2013   Echo Study Conclusions  - Left ventricle: The cavity size was normal. Wall thickness was increased in a pattern of moderate LVH. Systolic function was normal. The estimated ejection fraction was in the range of 60% to 65%. Wall motion was normal; there were no regional wall motion abnormalities. Doppler parameters are consistent with abnormal left ventricular relaxation (grade 1 diastolic dysfunction). - Aortic valve: There was moderate stenosis. Mean gradient: 30mm Hg (S). Peak gradient: 46mm Hg (S). - Left atrium: The atrium was mildly to moderately dilated. - Right ventricle: The cavity size was mildly dilated. - Right atrium: The atrium was mildly dilated.   Myoview Impression  Exercise Capacity: Lexiscan with no exercise.  BP Response: Normal blood pressure response.  Clinical Symptoms: shortness of breath  ECG Impression: No significant ST segment change suggestive of ischemia.  Comparison with Prior Nuclear Study: The prior study of August, 2010 was reported as showing some inferior ischemia. This is no longer seen.   Overall Impression: There is no definite scar or ischemia. This is a low risk scan.  LV  Ejection Fraction: 52%. LV Wall Motion: Low normal ejection fraction.   Theodore Rough, MD    Assessment / Plan:  1. CAD - with low risk Myoview recently completed. No symptoms at this time time. Will follow.   2. Moderate AS - this is a new diagnosis for him. Will need to be followed. We have discussed the cardinal symptoms to be on the look out for (chest pain, shortness of breath, syncope).   3. HTN - recently had ACE added -  checking BMET today - BP has improved. Recheck by me is 120/80.   4. HLD - on statin therapy.   5. DM - followed by PCP. Reports his sugars are improved with the weight loss and dietary changes.   6. Obesity - weight is down 6 pounds. I have encouraged him to stay on track.   7. PVD with known right total carotid occlusion. Follow up is by Vascular in [facility].   I will get him back to meet Dr. Shirlee Jenkins in 4 months. No change in his medicines.   Patient is agreeable to this plan and will call if any problems develop in the interim.   [name], RN, ANP-C [facility] [address] [place], [place]  [address]

## 2013-05-28 NOTE — Patient Instructions (Addendum)
We are rechecking labs today.   Stay on your current medicines  Let us know if you have any chest pain, shortness of breath or passing out.  See Dr. Shirlee Latch in 4 months  Call the [facility] office at [phone] if you have any questions, problems or concerns.

## 2013-07-03 ENCOUNTER — Encounter: Payer: Self-pay | Admitting: *Deleted

## 2013-07-03 ENCOUNTER — Telehealth: Payer: Self-pay | Admitting: Cardiology

## 2013-07-03 NOTE — Telephone Encounter (Signed)
New Prob      States activation code for MyChart is expired. Would like to speak to nurse.

## 2013-07-03 NOTE — Telephone Encounter (Signed)
Spoke with patient's wife. Given activation code for [facility] [id number] expires 08/02/13

## 2013-07-04 ENCOUNTER — Encounter: Payer: Self-pay | Admitting: Nurse Practitioner

## 2013-09-10 ENCOUNTER — Other Ambulatory Visit: Payer: Self-pay

## 2013-09-10 MED ORDER — LISINOPRIL 10 MG PO TABS: 10.0000 mg | ORAL_TABLET | Freq: Every day | ORAL | Status: DC

## 2013-09-23 ENCOUNTER — Ambulatory Visit (INDEPENDENT_AMBULATORY_CARE_PROVIDER_SITE_OTHER): Payer: 59 | Admitting: Nurse Practitioner

## 2013-09-23 ENCOUNTER — Encounter: Payer: Self-pay | Admitting: Nurse Practitioner

## 2013-09-23 VITALS — BP 112/70 | HR 64 | Ht 69.0 in | Wt 240.8 lb

## 2013-09-23 DIAGNOSIS — I2510 Atherosclerotic heart disease of native coronary artery without angina pectoris: Secondary | ICD-10-CM

## 2013-09-23 DIAGNOSIS — I350 Nonrheumatic aortic (valve) stenosis: Secondary | ICD-10-CM

## 2013-09-23 DIAGNOSIS — I359 Nonrheumatic aortic valve disorder, unspecified: Secondary | ICD-10-CM

## 2013-09-23 NOTE — Patient Instructions (Signed)
Stay on your current medicines  See Dr. Shirlee Latch in 6 months  Stay active  Call the [facility] office at [phone] if you have any questions, problems or concerns.

## 2013-09-23 NOTE — Progress Notes (Signed)
[name] Date of Birth: [birth year] Medical Record #[id number]  History of Present Illness: Theodore Jenkins is seen back today for a 4 month follow up visit. Seen for Dr. Shirlee Latch. Former patient of Dr. Ronnald Nian. Has known CAD with remote stenting of the LAD in 2005. Last cath in 2010 showing diffuse CAD. Managed medically. Other problems include obesity, HLD, HTN, ED and DM. He has had past right carotid endarterectomy with subsequent complete closure in the past. Previously seen by Dr. Madilyn Fireman with [facility] and now followed by Vascular in [facility].   Seen back in June after stress testing and echo. Has had a low risk Myoview. Moderate AS noted on echo with no cardinal symptoms. ACE was added for his BP.   Comes back today. Here alone. Was to have met Dr. Shirlee Latch.  He is doing well clinically. No chest pain. Not short of breath. No dizzy or lightheaded. No syncope. Tolerating his medicines. Doing well overall.   Current Outpatient Prescriptions  Medication Sig Dispense Refill  . aspirin 325 MG tablet Take 325 mg by mouth daily.        . cetirizine (ZYRTEC) 10 MG tablet Take 10 mg by mouth as needed.        [name] JANUMET 50-1000 MG per tablet Take 1 tablet by mouth Twice daily.      [name] lisinopril (PRINIVIL,ZESTRIL) 10 MG tablet Take 1 tablet (10 mg total) by mouth daily.  30 tablet  3  . metoprolol succinate (TOPROL-XL) 50 MG 24 hr tablet Take 1 tablet (50 mg total) by mouth daily. Take with or immediately following a meal.  90 tablet  3  . nitroGLYCERIN (NITROSTAT) 0.4 MG SL tablet Place 1 tablet (0.4 mg total) under the tongue every 5 (five) minutes as needed.  100 tablet  3  . omeprazole (PRILOSEC OTC) 20 MG tablet Take 20 mg by mouth daily.        . rosuvastatin (CRESTOR) 10 MG tablet Take 1 tablet (10 mg total) by mouth daily.  90 tablet  1  . sildenafil (VIAGRA) 100 MG tablet Take 1 tablet (100 mg total) by mouth daily as needed for erectile dysfunction.  10 tablet  3   No current facility-administered  medications for this visit.    No Known Allergies  Past Medical History  Diagnosis Date  . IHD (ischemic heart disease)     prior stenting of the LAD in December of 2005; with last cath in 2010 showing diffuse CAD with normal LV function. He is managed medically.   . Diabetes mellitus   . Obesity   . Hypertension   . ED (erectile dysfunction)   . Hyperlipidemia   . Barrett's esophagus   . GERD (gastroesophageal reflux disease)   . Obstructive sleep apnea on CPAP   . S/P coronary artery stent placement 2005    LAD    Past Surgical History  Procedure Laterality Date  . Cardiac catheterization  2010    NORMAL LV FUNCTION. DIFFUSE CORONARY DISEASE WITH THE DISTAL PORTION OF THE SMALL POSTERIOR DESCENDING VESSEL OCCLUDED AND FILLING WITH THE RIGHT COLLATERALS, 60-70% STENOSIS IN THE FIRST OBTUSE MARGINAL VESSEL AND DIFFUSE SMALL VESSEL DISEASE  . Circumcision    . Coronary stent placement  2005    History  Smoking status  . Former Smoker  . Quit date: 12/12/1982  Smokeless tobacco  . Never Used    History  Alcohol Use No    Family History  Problem Relation Age of  Onset  . Cancer Mother   . Heart attack Father 20    Review of Systems: The review of systems is per the HPI.  All other systems were reviewed and are negative.  Physical Exam: BP 112/70  Pulse 64  Ht 5\' 9"  (1.753 m)  Wt 240 lb 12.8 oz (109.226 kg)  BMI 35.54 kg/m2  SpO2 94% Patient is very pleasant and in no acute distress. He remains obese. Weight down just a pound. Skin is warm and dry. Color is normal.  HEENT is unremarkable. Normocephalic/atraumatic. PERRL. Sclera are nonicteric. Neck is supple. No masses. No JVD. Lungs are clear. Cardiac exam shows a regular rate and rhythm. Harsh outflow murmur.  Abdomen is obese but soft. Extremities are without edema. Gait and ROM are intact. No gross neurologic deficits noted.  LABORATORY DATA: Lab Results  Component Value Date   WBC 6.2 05/07/2013   HGB  14.8 05/07/2013   HCT 43.7 05/07/2013   PLT 160.0 05/07/2013   GLUCOSE 88 05/28/2013   CHOL 105 05/07/2013   TRIG 110.0 05/07/2013   HDL 35.80* 05/07/2013   LDLCALC 47 05/07/2013   ALT 26 05/28/2013   AST 33 05/28/2013   NA 136 05/28/2013   K 3.9 05/28/2013   CL 105 05/28/2013   CREATININE 1.2 05/28/2013   BUN 17 05/28/2013   CO2 22 05/28/2013   HGBA1C 7.2* 05/07/2013   Echo Study Conclusions from June 2014  - Left ventricle: The cavity size was normal. Wall thickness was increased in a pattern of moderate LVH. Systolic function was normal. The estimated ejection fraction was in the range of 60% to 65%. Wall motion was normal; there were no regional wall motion abnormalities. Doppler parameters are consistent with abnormal left ventricular relaxation (grade 1 diastolic dysfunction). - Aortic valve: There was moderate stenosis. Mean gradient: 30mm Hg (S). Peak gradient: 46mm Hg (S). - Left atrium: The atrium was mildly to moderately dilated. - Right ventricle: The cavity size was mildly dilated. - Right atrium: The atrium was mildly dilated.   Myoview Impression from June 2014 Exercise Capacity: Lexiscan with no exercise.  BP Response: Normal blood pressure response.  Clinical Symptoms: shortness of breath  ECG Impression: No significant ST segment change suggestive of ischemia.  Comparison with Prior Nuclear Study: The prior study of August, 2010 was reported as showing some inferior ischemia. This is no longer seen.  Overall Impression: There is no definite scar or ischemia. This is a low risk scan.  LV Ejection Fraction: 52%. LV Wall Motion: Low normal ejection fraction.  Willa Rough, MD    Assessment / Plan: 1 CAD - low risk Myoview earlier this year - doing well clinically. If he needs a repeat study he is felt to be ok to actually walk on the treadmill.  2  AS - moderate by echo - no cardinal symptoms noted  3. HLD - on statin therapy  4. DM  5. Obesity - long standing  issue.  Will see him back in 6 months. Continue with current regimen.   Patient is agreeable to this plan and will call if any problems develop in the interim.   [name], RN, ANP-C [facility] [address] [place], [place]  [address]

## 2013-10-10 ENCOUNTER — Other Ambulatory Visit: Payer: Self-pay

## 2013-10-10 MED ORDER — LISINOPRIL 10 MG PO TABS: 10.0000 mg | ORAL_TABLET | Freq: Every day | ORAL | Status: DC

## 2013-10-17 ENCOUNTER — Other Ambulatory Visit: Payer: Self-pay

## 2013-12-12 ENCOUNTER — Other Ambulatory Visit: Payer: Self-pay | Admitting: Cardiology

## 2013-12-13 ENCOUNTER — Other Ambulatory Visit: Payer: Self-pay | Admitting: Nurse Practitioner

## 2014-02-18 ENCOUNTER — Other Ambulatory Visit: Payer: Self-pay | Admitting: *Deleted

## 2014-02-18 MED ORDER — LISINOPRIL 10 MG PO TABS: ORAL_TABLET | ORAL | Status: DC

## 2014-02-24 ENCOUNTER — Other Ambulatory Visit: Payer: Self-pay | Admitting: Cardiology

## 2014-02-24 ENCOUNTER — Other Ambulatory Visit: Payer: Self-pay | Admitting: Nurse Practitioner

## 2014-04-30 ENCOUNTER — Other Ambulatory Visit: Payer: Self-pay | Admitting: Cardiology

## 2014-05-01 ENCOUNTER — Encounter: Payer: Self-pay | Admitting: Cardiology

## 2014-05-01 ENCOUNTER — Encounter: Payer: Self-pay | Admitting: *Deleted

## 2014-05-01 ENCOUNTER — Ambulatory Visit (INDEPENDENT_AMBULATORY_CARE_PROVIDER_SITE_OTHER): Payer: 59 | Admitting: Cardiology

## 2014-05-01 VITALS — BP 124/67 | HR 68 | Ht 69.0 in | Wt 239.0 lb

## 2014-05-01 DIAGNOSIS — I10 Essential (primary) hypertension: Secondary | ICD-10-CM

## 2014-05-01 DIAGNOSIS — I2510 Atherosclerotic heart disease of native coronary artery without angina pectoris: Secondary | ICD-10-CM

## 2014-05-01 DIAGNOSIS — I359 Nonrheumatic aortic valve disorder, unspecified: Secondary | ICD-10-CM

## 2014-05-01 DIAGNOSIS — E785 Hyperlipidemia, unspecified: Secondary | ICD-10-CM

## 2014-05-01 DIAGNOSIS — I350 Nonrheumatic aortic (valve) stenosis: Secondary | ICD-10-CM

## 2014-05-01 NOTE — Patient Instructions (Signed)
Your physician has requested that you have an echocardiogram. Echocardiography is a painless test that uses sound waves to create images of your heart. It provides your doctor with information about the size and shape of your heart and how well your heart's chambers and valves are working. This procedure takes approximately one hour. There are no restrictions for this procedure.  Your physician wants you to follow-up in: 6 months with Dr Aundra Dubin. (November 2015). You will receive a reminder letter in the mail two months in advance. If you don't receive a letter, please call our office to schedule the follow-up appointment.

## 2014-05-03 DIAGNOSIS — I350 Nonrheumatic aortic (valve) stenosis: Secondary | ICD-10-CM | POA: Insufficient documentation

## 2014-05-03 NOTE — Progress Notes (Signed)
Patient ID: Theodore Jenkins, male   DOB: September 16, 1946, 68 y.o.   MRN: [id number] PCP: Dr. Vista Lawman  68 yo with history of CAD s/p PCI, HTN, carotid stenosis s/p right CEA followed by RICA occlusion, and moderate aortic stenosis presents for cardiology followup.  Last Cardiolite in 6/14 showed no ischemia or infarction.  Last echo in 6/14 showed normal EF with moderate AS.  Patient has been doing well recently.  He walks on his treadmill for 45 minutes daily.  He has minimal chest pain.  He reports mild heaviness in his chest if he pushes the lawn mower up a hill.  This has been a chronic pattern.  No significant exertional dyspnea.  He can walk up stairs without problems.  He has no claudication-type symptoms.  No syncope.  Mild lightheadedness if he stands too fast.   ECG; NSR, 1st degree AVB, LVH  Labs (4/15): K 4.4, creatinine 1.2, LDL 61, HDL 38  PMH: 1. OSA: On CPAP.  2. GERD with Barretts esophagus 3. CAD: PCI to LAD in 2005.  LHC in 2010 with diffuse CAD managed medically.  Cardiolite in 6/14 with EF 52%, no ischemia or infarction.  4. Hyperlipidemia 5. HTN 6. Erectile dysfunction. 7. Type II diabetes 8. Carotid stenosis: Right CEA then occlusion of the right carotid shortly thereafter.  He sees a Hydrographic surveyor yearly in [employer] [facility] [place].  9. Aortic stenosis: Echo (6/14) with EF 60-65%, moderate AS with mean gradient 30 mmHg, mildly dilated RV.   SH: Married, quit smoking 1984, lives in [place].  FH: Father with MI at 109.  ROS: All systems reviewed and negative except as per HPI.   Current Outpatient Prescriptions  Medication Sig Dispense Refill  . aspirin 325 MG tablet Take 325 mg by mouth daily.        . cetirizine (ZYRTEC) 10 MG tablet Take 10 mg by mouth as needed.        . CRESTOR 10 MG tablet Take 1 tablet by mouth  daily  90 tablet  0  . JANUMET 50-1000 MG per tablet Take 1 tablet by mouth Twice daily.      [name] lisinopril (PRINIVIL,ZESTRIL) 10 MG tablet Take 1 tablet by mouth   daily  90 tablet  0  . metoprolol succinate (TOPROL-XL) 50 MG 24 hr tablet Take 1 tablet by mouth  daily with a meal or  immediately following a  meal  90 tablet  0  . nitroGLYCERIN (NITROSTAT) 0.4 MG SL tablet Place 1 tablet (0.4 mg total) under the tongue every 5 (five) minutes as needed.  100 tablet  3  . omeprazole (PRILOSEC OTC) 20 MG tablet Take 20 mg by mouth daily.        . sildenafil (VIAGRA) 100 MG tablet Take 1 tablet (100 mg total) by mouth daily as needed for erectile dysfunction.  10 tablet  3   No current facility-administered medications for this visit.    BP 124/67  Pulse 68  Ht 5\' 9"  (1.753 m)  Wt 239 lb (108.41 kg)  BMI 35.28 kg/m2 General: NAD Neck: No JVD, no thyromegaly or thyroid nodule.  Lungs: Clear to auscultation bilaterally with normal respiratory effort. CV: Nondisplaced PMI.  Heart regular S1/S2, no S3/S4, 2/6 SEM RUSB with clear S2.  No peripheral edema.  No carotid bruit.  Normal pedal pulses.  Abdomen: Soft, nontender, no hepatosplenomegaly, no distention.  Skin: Intact without lesions or rashes.  Neurologic: Alert and oriented x 3.  Psych: Normal  affect. Extremities: No clubbing or cyanosis.   Assessment/Plan: 1. CAD: PCI of LAD in 2005.  No significant ischemic symptoms.  Continue Crestor, ASA, ACEI, beta blocker.  2. Aortic stenosis: Moderate on last echo.  No worrisome symptoms.  Repeat echo in 6/15.   3. HTN: BP is under good control. 4. Hyperlipidemia: LDL < 70 (at goal) in 4/15.  5. Carotid stenosis: Right CEA followed by RICA occlusion.  Sees vascular surgeon in [facility] yearly.   Larey Dresser 05/03/2014

## 2014-05-07 ENCOUNTER — Other Ambulatory Visit: Payer: Self-pay | Admitting: Nurse Practitioner

## 2014-05-15 ENCOUNTER — Other Ambulatory Visit: Payer: Self-pay | Admitting: Cardiology

## 2014-05-15 MED ORDER — ROSUVASTATIN CALCIUM 10 MG PO TABS: ORAL_TABLET | ORAL | Status: DC

## 2014-05-20 ENCOUNTER — Ambulatory Visit (HOSPITAL_COMMUNITY): Payer: 59 | Attending: Cardiovascular Disease | Admitting: Cardiology

## 2014-05-20 DIAGNOSIS — I350 Nonrheumatic aortic (valve) stenosis: Secondary | ICD-10-CM

## 2014-05-20 DIAGNOSIS — I359 Nonrheumatic aortic valve disorder, unspecified: Secondary | ICD-10-CM | POA: Insufficient documentation

## 2014-05-20 DIAGNOSIS — I2510 Atherosclerotic heart disease of native coronary artery without angina pectoris: Secondary | ICD-10-CM

## 2014-05-20 NOTE — Progress Notes (Signed)
Echo performed. 

## 2014-09-28 ENCOUNTER — Other Ambulatory Visit: Payer: Self-pay | Admitting: Cardiology

## 2014-10-02 ENCOUNTER — Ambulatory Visit (INDEPENDENT_AMBULATORY_CARE_PROVIDER_SITE_OTHER): Payer: 59 | Admitting: Cardiology

## 2014-10-02 ENCOUNTER — Encounter: Payer: Self-pay | Admitting: Cardiology

## 2014-10-02 VITALS — BP 106/68 | HR 64 | Ht 69.0 in | Wt 249.0 lb

## 2014-10-02 DIAGNOSIS — I350 Nonrheumatic aortic (valve) stenosis: Secondary | ICD-10-CM

## 2014-10-02 DIAGNOSIS — R0789 Other chest pain: Secondary | ICD-10-CM

## 2014-10-02 DIAGNOSIS — R079 Chest pain, unspecified: Secondary | ICD-10-CM

## 2014-10-02 DIAGNOSIS — I10 Essential (primary) hypertension: Secondary | ICD-10-CM

## 2014-10-02 DIAGNOSIS — I2510 Atherosclerotic heart disease of native coronary artery without angina pectoris: Secondary | ICD-10-CM

## 2014-10-02 NOTE — Patient Instructions (Signed)
Your physician has requested that you have a lexiscan myoview. For further information please visit [email or web address]. Please follow instruction sheet, as given.  Your physician wants you to follow-up in: 6 MONTHS with Dr Aundra Dubin.  You will receive a reminder letter in the mail two months in advance. If you don't receive a letter, please call our office to schedule the follow-up appointment.  Your physician recommends that you continue on your current medications as directed. Please refer to the Current Medication list given to you today.

## 2014-10-03 DIAGNOSIS — R079 Chest pain, unspecified: Secondary | ICD-10-CM | POA: Insufficient documentation

## 2014-10-03 NOTE — Progress Notes (Signed)
Patient ID: Theodore Jenkins, male   DOB: July 28, 1946, 68 y.o.   MRN: [id number] PCP: Dr. Vista Lawman  68 yo with history of CAD s/p PCI, HTN, carotid stenosis s/p right CEA followed by RICA occlusion, and moderate aortic stenosis presents for cardiology followup.  Last Cardiolite in 6/14 showed no ischemia or infarction.  Last echo in 6/15 showed normal EF with moderate AS. Lately, he feels like he has been more fatigued.  This was especially profound about a week ago and is better this week.  He is still able to walk for 45 minutes at a steady pace on his treadmill.  However, for the last couple of months he has noted chest pressure with heavier exertion like push mowing.  This is new.  No lightheadedness or palpitations.  No orthopnea or PND.  No claudication or stroke-like symptoms.   ECG; NSR, 1st degree AVB  Labs (4/15): K 4.4, creatinine 1.2, LDL 61, HDL 38  PMH: 1. OSA: On CPAP.  2. GERD with Barretts esophagus 3. CAD: PCI to LAD in 2005.  LHC in 2010 with diffuse CAD managed medically.  Cardiolite in 6/14 with EF 52%, no ischemia or infarction.  4. Hyperlipidemia 5. HTN 6. Erectile dysfunction. 7. Type II diabetes 8. Carotid stenosis: Right CEA then occlusion of the right carotid shortly thereafter.  He sees a Hydrographic surveyor yearly in [place].  9. Aortic stenosis: Echo (6/14) with EF 60-65%, moderate AS with mean gradient 30 mmHg, mildly dilated RV. Echo (6/15) with EF 55-60%, severe LVH, moderate AS with mean gradient 27 mmHg and AVA 1.11 cm^2.   SH: Married, quit smoking 1984, lives in [place].  FH: Father with MI at 23.  ROS: All systems reviewed and negative except as per HPI.   Current Outpatient Prescriptions  Medication Sig Dispense Refill  . aspirin 325 MG tablet Take 325 mg by mouth daily.        . cetirizine (ZYRTEC) 10 MG tablet Take 10 mg by mouth as needed.        . CRESTOR 10 MG tablet Take 1 tablet by mouth  daily  90 tablet  0  . JANUMET 50-1000 MG per tablet Take  1 tablet by mouth Twice daily.      [name] lisinopril (PRINIVIL,ZESTRIL) 10 MG tablet Take 1 tablet by mouth  daily  90 tablet  0  . metoprolol succinate (TOPROL-XL) 50 MG 24 hr tablet Take 1 tablet by mouth   daily with a meal or   immediately following a   meal  90 tablet  2  . nitroGLYCERIN (NITROSTAT) 0.4 MG SL tablet Place 1 tablet (0.4 mg total) under the tongue every 5 (five) minutes as needed.  100 tablet  3  . omeprazole (PRILOSEC OTC) 20 MG tablet Take 20 mg by mouth daily.        . sildenafil (VIAGRA) 100 MG tablet Take 1 tablet (100 mg total) by mouth daily as needed for erectile dysfunction.  10 tablet  3   No current facility-administered medications for this visit.    BP 106/68  Pulse 64  Ht 5\' 9"  (1.753 m)  Wt 249 lb (112.946 kg)  BMI 36.75 kg/m2 General: NAD Neck: No JVD, no thyromegaly or thyroid nodule.  Lungs: Clear to auscultation bilaterally with normal respiratory effort. CV: Nondisplaced PMI.  Heart regular S1/S2, no S3/S4, 3/6 SEM RUSB with clear S2.  No peripheral edema.  No carotid bruit.  Normal pedal pulses.  Abdomen: Soft,  nontender, no hepatosplenomegaly, no distention.  Skin: Intact without lesions or rashes.  Neurologic: Alert and oriented x 3.  Psych: Normal affect. Extremities: No clubbing or cyanosis.   Assessment/Plan: 1. CAD: PCI of LAD in 2005. He has developed some chest discomfort with moderate exertion recently.   - Continue Crestor, ASA, ACEI, beta blocker.  - I will arrange for Lexiscan Cardiolite given exertional chest discomfort.  He does not think that he could do full ETT with knee pain.   2. Aortic stenosis: Moderate on last echo in 6/15.  Repeat echo in 6/16.   3. HTN: BP is under good control. 4. Hyperlipidemia: LDL < 70 (at goal) in 4/15.  5. Carotid stenosis: Right CEA followed by RICA occlusion.  Sees vascular surgeon in [facility] yearly.   Loralie Champagne 10/03/2014

## 2014-10-07 ENCOUNTER — Ambulatory Visit (HOSPITAL_COMMUNITY): Payer: 59 | Attending: Cardiology | Admitting: Radiology

## 2014-10-07 VITALS — BP 143/68 | HR 57 | Ht 69.0 in | Wt 243.0 lb

## 2014-10-07 DIAGNOSIS — E119 Type 2 diabetes mellitus without complications: Secondary | ICD-10-CM | POA: Insufficient documentation

## 2014-10-07 DIAGNOSIS — I10 Essential (primary) hypertension: Secondary | ICD-10-CM | POA: Diagnosis not present

## 2014-10-07 DIAGNOSIS — R079 Chest pain, unspecified: Secondary | ICD-10-CM | POA: Insufficient documentation

## 2014-10-07 DIAGNOSIS — I2510 Atherosclerotic heart disease of native coronary artery without angina pectoris: Secondary | ICD-10-CM

## 2014-10-07 DIAGNOSIS — R5383 Other fatigue: Secondary | ICD-10-CM | POA: Diagnosis not present

## 2014-10-07 DIAGNOSIS — R0789 Other chest pain: Secondary | ICD-10-CM

## 2014-10-07 MED ORDER — TECHNETIUM TC 99M SESTAMIBI GENERIC - CARDIOLITE
30.0000 | Freq: Once | INTRAVENOUS | Status: AC | PRN
  Administered 2014-10-07: 30 via INTRAVENOUS

## 2014-10-07 MED ORDER — TECHNETIUM TC 99M SESTAMIBI GENERIC - CARDIOLITE
10.0000 | Freq: Once | INTRAVENOUS | Status: AC | PRN
  Administered 2014-10-07: 10 via INTRAVENOUS

## 2014-10-07 MED ORDER — REGADENOSON 0.4 MG/5ML IV SOLN
0.4000 mg | Freq: Once | INTRAVENOUS | Status: AC
  Administered 2014-10-07: 0.4 mg via INTRAVENOUS

## 2014-10-07 NOTE — Progress Notes (Addendum)
Theodore Jenkins 3 NUCLEAR MED [address] [place], [place] [address] [phone]    Cardiology Nuclear Med Study  Theodore Jenkins is a 68 y.o. male     MRN : [id number]     DOB: 1946/08/09  Procedure Date: 10/07/2014  Nuclear Med Background Indication for Stress Test:  Evaluation for Ischemia and Stent Patency History:  CAD, MPI 2014 (normal) EF 52% Cardiac Risk Factors: Carotid Disease, Hypertension and NIDDM  Symptoms:  Chest Pain and Fatigue   Nuclear Pre-Procedure Caffeine/Decaff Intake:  None> 12 hrs NPO After: 7:00pm   Lungs:  clear O2 Sat: 96% on room air. IV 0.9% NS with Angio Cath:  22g  IV Site: R Antecubital x 1, tolerated well IV Started by:  Irven Baltimore, RN  Chest Size (in):  48 Cup Size: n/a  Height: 5\' 9"  (1.753 m)  Weight:  243 lb (110.224 kg)  BMI:  Body mass index is 35.87 kg/(m^2). Tech Comments:  No medications (Janumet) this am. Patient took Toprol last night. Irven Baltimore, RN.    Nuclear Med Study 1 or 2 day study: 1 day  Stress Test Type:  Carlton Adam  Reading MD: N/A  Order Authorizing Provider:  Loralie Champagne, MD  Resting Radionuclide: Technetium 61m Sestamibi  Resting Radionuclide Dose: 11.0 mCi   Stress Radionuclide:  Technetium 29m Sestamibi  Stress Radionuclide Dose: 33.0 mCi           Stress Protocol Rest HR: 57 Stress HR: 66  Rest BP: 143/68 Stress BP: 118/67  Exercise Time (min): n/a METS: n/a   Predicted Max HR: 155 bpm % Max HR: 42.58 bpm Rate Pressure Product: 9504   Dose of Adenosine (mg):  n/a Dose of Lexiscan: 0.4 mg  Dose of Atropine (mg): n/a Dose of Dobutamine: n/a mcg/kg/min (at max HR)  Stress Test Technologist: Glade Lloyd, BS-ES  Nuclear Technologist:  Vedia Pereyra, CNMT     Rest Procedure:  Myocardial perfusion imaging was performed at rest 45 minutes following the intravenous administration of Technetium 76m Sestamibi. Rest ECG: NSR - Normal EKG  Stress Procedure:  The patient received IV Lexiscan  0.4 mg over 15-seconds.  Technetium 31m Sestamibi injected at 30-seconds.  Quantitative spect images were obtained after a 45 minute delay.  During the infusion of Lexiscan the patient complained of SOB, nausea and headache.  These symptoms began to resolve in recovery.  Stress ECG: No significant change from baseline ECG  QPS Raw Data Images:  Normal; no motion artifact; normal heart/lung ratio. Stress Images:  Normal homogeneous uptake in all areas of the myocardium. Rest Images:  Normal homogeneous uptake in all areas of the myocardium. Subtraction (SDS):  No evidence of ischemia. Transient Ischemic Dilatation (Normal <1.22):  1.14 Lung/Heart Ratio (Normal <0.45):  0.35  Quantitative Gated Spect Images QGS EDV:  135 ml QGS ESV:  66 ml  Impression Exercise Capacity:  Lexiscan with no exercise. BP Response:  Hypotensive blood pressure response. Clinical Symptoms:  No significant symptoms noted. ECG Impression:  No significant ECG changes with Lexiscan. Comparison with Prior Nuclear Study: No significant change from previous study  Overall Impression:  Normal stress nuclear study.  LV Ejection Fraction: 51%.  LV Wall Motion:  Normal Wall Motion  [name], MD, [facility] Board Certified in Nuclear Cardiology Attending Cardiologist [place]  EF 51%, no ischemia or infarction.  OK.   Loralie Champagne .10/08/2014

## 2014-10-09 NOTE — Progress Notes (Signed)
Pt.notified

## 2014-12-19 ENCOUNTER — Other Ambulatory Visit: Payer: Self-pay | Admitting: Nurse Practitioner

## 2014-12-27 ENCOUNTER — Other Ambulatory Visit: Payer: Self-pay | Admitting: Cardiology

## 2015-03-27 ENCOUNTER — Other Ambulatory Visit: Payer: Self-pay | Admitting: Cardiology

## 2015-04-09 ENCOUNTER — Encounter: Payer: Self-pay | Admitting: Cardiology

## 2015-04-09 ENCOUNTER — Encounter: Payer: Self-pay | Admitting: *Deleted

## 2015-04-09 ENCOUNTER — Ambulatory Visit (INDEPENDENT_AMBULATORY_CARE_PROVIDER_SITE_OTHER): Payer: 59 | Admitting: Cardiology

## 2015-04-09 VITALS — BP 132/82 | HR 67 | Ht 69.0 in | Wt 246.8 lb

## 2015-04-09 DIAGNOSIS — I2510 Atherosclerotic heart disease of native coronary artery without angina pectoris: Secondary | ICD-10-CM | POA: Diagnosis not present

## 2015-04-09 DIAGNOSIS — I10 Essential (primary) hypertension: Secondary | ICD-10-CM | POA: Diagnosis not present

## 2015-04-09 DIAGNOSIS — E785 Hyperlipidemia, unspecified: Secondary | ICD-10-CM | POA: Diagnosis not present

## 2015-04-09 DIAGNOSIS — I350 Nonrheumatic aortic (valve) stenosis: Secondary | ICD-10-CM | POA: Diagnosis not present

## 2015-04-09 NOTE — Patient Instructions (Signed)
Medication Instructions:  No changes today.  Labwork: None today.  Testing/Procedures: Your physician has requested that you have an echocardiogram. Echocardiography is a painless test that uses sound waves to create images of your heart. It provides your doctor with information about the size and shape of your heart and how well your heart's chambers and valves are working. This procedure takes approximately one hour. There are no restrictions for this procedure. June 2016     Follow-Up: Your physician wants you to follow-up in: 1 year with Dr Aundra Dubin. (April 2017) Y ou will receive a reminder letter in the mail two months in advance. If you don't receive a letter, please call our office to schedule the follow-up appointment.

## 2015-04-09 NOTE — Progress Notes (Signed)
Patient ID: Theodore Jenkins, male   DOB: December 19, 1945, 69 y.o.   MRN: [id number] PCP: Dr. Vista Lawman  70 yo with history of CAD s/p PCI, HTN, carotid stenosis s/p right CEA followed by RICA occlusion, and moderate aortic stenosis presents for cardiology followup.  Last Cardiolite in 6/14 showed no ischemia or infarction.  Last echo in 6/15 showed normal EF with moderate AS. He had a Cardiolite in 10/15 with no ischemia or infarction.   He has been stable recently.  No exertional chest pain.  No dyspnea walking on flat ground or walking on his treadmill.  He notes shortness of breath when pushing his lawn mower up a hill.  No lightheadedness or palpitations.  No orthopnea or PND.  No claudication or stroke-like symptoms. Weight is down 3 lbs.   Labs (4/15): K 4.4, creatinine 1.2, LDL 61, HDL 38 Labs (12/15): LDL 73, HDL 38  PMH: 1. OSA: On CPAP.  2. GERD with Barretts esophagus 3. CAD: PCI to LAD in 2005.  LHC in 2010 with diffuse CAD managed medically.  Cardiolite in 6/14 with EF 52%, no ischemia or infarction. Lexiscan Cardiolite (10/15) with EF 51%, no ischemia or infarction.  4. Hyperlipidemia 5. HTN 6. Erectile dysfunction. 7. Type II diabetes 8. Carotid stenosis: Right CEA then occlusion of the right carotid shortly thereafter.  He sees a Hydrographic surveyor yearly in [place].  9. Aortic stenosis: Echo (6/14) with EF 60-65%, moderate AS with mean gradient 30 mmHg, mildly dilated RV. Echo (6/15) with EF 55-60%, severe LVH, moderate AS with mean gradient 27 mmHg and AVA 1.11 cm^2.   SH: Married, quit smoking 1984, lives in [place].  FH: Father with MI at 2.  ROS: All systems reviewed and negative except as per HPI.   Current Outpatient Prescriptions  Medication Sig Dispense Refill  . aspirin 325 MG tablet Take 325 mg by mouth daily.      . cetirizine (ZYRTEC) 10 MG tablet Take 10 mg by mouth as needed.      . CRESTOR 10 MG tablet Take 1 tablet by mouth  daily 90 tablet 1  . JANUMET  50-1000 MG per tablet Take 1 tablet by mouth Twice daily.    [name] lisinopril (PRINIVIL,ZESTRIL) 10 MG tablet Take 1 tablet by mouth  daily 90 tablet 1  . metoprolol succinate (TOPROL-XL) 50 MG 24 hr tablet Take 1 tablet by mouth    daily with a meal or    immediately following a    meal 90 tablet 1  . nitroGLYCERIN (NITROSTAT) 0.4 MG SL tablet Place 1 tablet (0.4 mg total) under the tongue every 5 (five) minutes as needed. 100 tablet 3  . omeprazole (PRILOSEC OTC) 20 MG tablet Take 20 mg by mouth daily.      . sildenafil (VIAGRA) 100 MG tablet Take 1 tablet (100 mg total) by mouth daily as needed for erectile dysfunction. 10 tablet 3   No current facility-administered medications for this visit.    BP 132/82 mmHg  Pulse 67  Ht 5\' 9"  (1.753 m)  Wt 246 lb 12.8 oz (111.948 kg)  BMI 36.43 kg/m2 General: NAD Neck: No JVD, no thyromegaly or thyroid nodule.  Lungs: Clear to auscultation bilaterally with normal respiratory effort. CV: Nondisplaced PMI.  Heart regular S1/S2, no S3/S4, 3/6 SEM RUSB with clear S2.  No peripheral edema.  No carotid bruit.  Normal pedal pulses.  Abdomen: Soft, nontender, no hepatosplenomegaly, no distention.  Skin: Intact without lesions or  rashes.  Neurologic: Alert and oriented x 3.  Psych: Normal affect. Extremities: No clubbing or cyanosis.   Assessment/Plan: 1. CAD: PCI of LAD in 2005. No chest pain.  Cardiolite in 10/15 showed no ischemia or infarction.  - Continue Crestor, ASA, ACEI, beta blocker.  2. Aortic stenosis: Moderate on last echo in 6/15.  Repeat echo will be arranged for 6/16.  No symptoms suggestive of symptomatic aortic stenosis, murmur does not sound like severe aortic stenosis.  3. HTN: BP is under good control. 4. Hyperlipidemia: Good LDL in 12/15.  5. Carotid stenosis: Right CEA followed by RICA occlusion.  Sees vascular surgeon in [facility] yearly.   Loralie Champagne 04/09/2015

## 2015-05-13 ENCOUNTER — Ambulatory Visit (HOSPITAL_COMMUNITY): Payer: 59 | Attending: Cardiovascular Disease

## 2015-05-13 ENCOUNTER — Other Ambulatory Visit: Payer: Self-pay

## 2015-05-13 DIAGNOSIS — I517 Cardiomegaly: Secondary | ICD-10-CM | POA: Insufficient documentation

## 2015-05-13 DIAGNOSIS — I351 Nonrheumatic aortic (valve) insufficiency: Secondary | ICD-10-CM | POA: Diagnosis not present

## 2015-05-13 DIAGNOSIS — I350 Nonrheumatic aortic (valve) stenosis: Secondary | ICD-10-CM | POA: Diagnosis not present

## 2015-06-08 ENCOUNTER — Other Ambulatory Visit: Payer: Self-pay

## 2015-06-17 ENCOUNTER — Other Ambulatory Visit: Payer: Self-pay | Admitting: Cardiology

## 2015-09-23 ENCOUNTER — Other Ambulatory Visit: Payer: Self-pay | Admitting: Cardiology

## 2015-12-14 ENCOUNTER — Other Ambulatory Visit: Payer: Self-pay | Admitting: Cardiology

## 2015-12-22 DIAGNOSIS — I714 Abdominal aortic aneurysm, without rupture, unspecified: Secondary | ICD-10-CM | POA: Insufficient documentation

## 2016-04-12 ENCOUNTER — Other Ambulatory Visit: Payer: Self-pay | Admitting: Cardiology

## 2016-06-27 ENCOUNTER — Other Ambulatory Visit: Payer: Self-pay | Admitting: Cardiology

## 2016-07-02 ENCOUNTER — Other Ambulatory Visit: Payer: Self-pay | Admitting: Cardiology

## 2016-07-07 ENCOUNTER — Other Ambulatory Visit: Payer: Self-pay | Admitting: Cardiology

## 2016-07-13 ENCOUNTER — Other Ambulatory Visit: Payer: Self-pay | Admitting: Cardiology

## 2016-07-13 NOTE — Telephone Encounter (Signed)
Theodore Monte, MD at 04/09/2015 10:34 PM CRESTOR 10 MG tablet Take 1 tablet by mouth  daily   lisinopril (PRINIVIL,ZESTRIL) 10 MG tablet Take 1 tablet by mouth  daily    Assessment/Plan: 1. CAD: PCI of LAD in 2005. No chest pain.  Cardiolite in 10/15 showed no ischemia or infarction.  - Continue Crestor, ASA, ACEI, beta blocker.   Patient Instructions   Medication Instructions:  No changes today.

## 2016-07-14 ENCOUNTER — Telehealth: Payer: Self-pay | Admitting: Cardiology

## 2016-07-14 MED ORDER — METOPROLOL SUCCINATE ER 50 MG PO TB24: ORAL_TABLET | ORAL | 0 refills | Status: DC

## 2016-07-14 NOTE — Telephone Encounter (Signed)
New message          *STAT* If patient is at the pharmacy, call can be transferred to refill team.   1. Which medications need to be refilled? (please list name of each medication and dose if known) Rosuvastatin 10 mg po ,Lisinopril 10 mg po daily,Metoprolol 50 mg po daily  2. Which pharmacy/location (including street and city if local pharmacy) is medication to be sent to?[facility]  3. Do they need a 30 day or 90 day supply? 90 day supple

## 2016-07-14 NOTE — Telephone Encounter (Signed)
Called pt's wife back to inform her that the pt's medications were sent to pt's pharmacy as requested, confirmation received. I advised the wife that if the pt has any other problems, questions or concerns to call the office. Wife verbalized understanding.

## 2016-07-26 ENCOUNTER — Encounter: Payer: Self-pay | Admitting: Physician Assistant

## 2016-08-08 DIAGNOSIS — I779 Disorder of arteries and arterioles, unspecified: Secondary | ICD-10-CM | POA: Insufficient documentation

## 2016-08-08 DIAGNOSIS — I739 Peripheral vascular disease, unspecified: Secondary | ICD-10-CM

## 2016-08-08 NOTE — Progress Notes (Signed)
Cardiology Office Note:    Date:  08/09/2016   ID:  Theodore Jenkins, DOB 10/07/1946, MRN [id number]  PCP:  Karlene Einstein, MD  Cardiologist:  Dr. Loralie Champagne   Electrophysiologist:  n/a  Referring MD: Karlene Einstein, MD   Chief Complaint  Patient presents with  . Follow-up    CAD, Aortic Stenosis    History of Present Illness:    Theodore Jenkins is a 70 y.o. male with a hx of CAD s/p PCI to LAD in 2005, carotid artery disease s/p R CEA followed by RICA occlusion, moderate aortic stenosis.  Cardiac catheterization in 2010 apparently demonstrated (I cannot locate the report in the chart) diffuse CAD with an occluded distal portion of a small PDA filled by right collaterals and OM1 60-70% stenosis. Myoview in 2015 was low risk.  Echo in 6/16 demonstrated normal LVEF and mod AS with mean AV gradient 32 mmHg.  Last sen by Dr. Loralie Champagne in 4/16.    Returns for FU.  Here alone.  Unfortunately, he was never called for his FU appointment.  He denies any chest pain, dyspnea.  He denies syncope.  He does get lightheaded with standing quickly.  Sleeps with CPAP.  Denies orthopnea, edema.  Denies bleeding issues.  He does note fatigue.  This has gradually worsened over 2 years.     Prior CV studies that were reviewed today include:    Echo 6/16 Mild concentric LVH, EF 123456, grade 1 diastolic dysfunction, bicuspid aortic valve, moderate aortic stenosis, mean gradient 32 mmHg, peak gradient 52 mmHg, mild LAE  Myoview 10/15 Overall Impression:  Normal stress nuclear study.  LV Ejection Fraction: 51%.  LV Wall Motion:  Normal Wall Motion  LHC 12/05 LM normal. LCx with 60-70% narrowing in the first marginal.   LAD distal severe 90-95%, then 60-70%  RCA with PDA that was very diffusely diseased with severe segmental and diffuse narrowings. PCI: 2.5 x 18 mm Cypher DES x 2 to distal LAD  Past Medical History:  Diagnosis Date  . Barrett's esophagus   . Diabetes mellitus   . ED (erectile  dysfunction)   . GERD (gastroesophageal reflux disease)   . Hyperlipidemia   . Hypertension   . IHD (ischemic heart disease)    prior stenting of the LAD in December of 2005; with last cath in 2010 showing diffuse CAD with normal LV function. He is managed medically.   . Obesity   . Obstructive sleep apnea on CPAP   . S/P coronary artery stent placement 2005   LAD  1. OSA: On CPAP.  2. GERD with Barretts esophagus 3. CAD: PCI to LAD in 2005.  LHC in 2010 with diffuse CAD managed medically.  Cardiolite in 6/14 with EF 52%, no ischemia or infarction. Lexiscan Cardiolite (10/15) with EF 51%, no ischemia or infarction.  4. Hyperlipidemia 5. HTN 6. Erectile dysfunction. 7. Type II diabetes 8. Carotid stenosis: Right CEA then occlusion of the right carotid shortly thereafter.  He sees a Hydrographic surveyor yearly in [place].  9. Aortic stenosis: Echo (6/14) with EF 60-65%, moderate AS with mean gradient 30 mmHg, mildly dilated RV. Echo (6/15) with EF 55-60%, severe LVH, moderate AS with mean gradient 27 mmHg and AVA 1.11 cm^2.    Past Surgical History:  Procedure Laterality Date  . CARDIAC CATHETERIZATION  2010   NORMAL LV FUNCTION. DIFFUSE CORONARY DISEASE WITH THE DISTAL PORTION OF THE SMALL POSTERIOR DESCENDING VESSEL OCCLUDED AND FILLING WITH THE  RIGHT COLLATERALS, 60-70% STENOSIS IN THE FIRST OBTUSE MARGINAL VESSEL AND DIFFUSE SMALL VESSEL DISEASE  . CIRCUMCISION    . CORONARY STENT PLACEMENT  2005    Current Medications: Outpatient Medications Prior to Visit  Medication Sig Dispense Refill  . aspirin 81 MG tablet Take 81 mg by mouth daily.     . cetirizine (ZYRTEC) 10 MG tablet Take 10 mg by mouth as needed for allergies or rhinitis.     [name] lisinopril (PRINIVIL,ZESTRIL) 10 MG tablet TAKE 1 TABLET BY MOUTH  DAILY. 90 tablet 1  . metoprolol succinate (TOPROL-XL) 50 MG 24 hr tablet Take 1 tablet by mouth  daily with a meal or  immediately following a  meal 90 tablet 0  . omeprazole  (PRILOSEC OTC) 20 MG tablet Take 20 mg by mouth daily.      . rosuvastatin (CRESTOR) 10 MG tablet TAKE 1 TABLET BY MOUTH  DAILY. 90 tablet 1  . JANUMET 50-1000 MG per tablet Take 1 tablet by mouth Twice daily.    . nitroGLYCERIN (NITROSTAT) 0.4 MG SL tablet Place 1 tablet (0.4 mg total) under the tongue every 5 (five) minutes as needed. (Patient not taking: Reported on 08/09/2016) 100 tablet 3  . sildenafil (VIAGRA) 100 MG tablet Take 1 tablet (100 mg total) by mouth daily as needed for erectile dysfunction. 10 tablet 3   No facility-administered medications prior to visit.       Allergies:   Review of patient's allergies indicates no known allergies.   Social History   Social History  . Marital status: Married    Spouse name: N/A  . Number of children: N/A  . Years of education: N/A   Social History Main Topics  . Smoking status: Former Smoker    Quit date: 12/12/1982  . Smokeless tobacco: Never Used  . Alcohol use No  . Drug use: No  . Sexual activity: Not Currently   Other Topics Concern  . None   Social History Narrative  . None     Family History:  The patient's family history includes Cancer in his mother; Heart attack (age of onset: 68) in his father.   ROS:   Please see the history of present illness.    Review of Systems  Hematologic/Lymphatic: Bruises/bleeds easily.  Neurological: Positive for dizziness.   All other systems reviewed and are negative.   EKGs/Labs/Other Test Reviewed:    EKG:  EKG is  ordered today.  The ekg ordered today demonstrates NSR, HR 65, 1st degree AVB, PR 258 ms, normal axis, septal Q waves, QTc 409 ms, no change from prior tracing.   Recent Labs: No results found for requested labs within last 8760 hours.   Recent Lipid Panel    Component Value Date/Time   CHOL 105 05/07/2013 0854   TRIG 110.0 05/07/2013 0854   HDL 35.80 (L) 05/07/2013 0854   CHOLHDL 3 05/07/2013 0854   VLDL 22.0 05/07/2013 0854   LDLCALC 47 05/07/2013 0854       Physical Exam:    VS:  BP 110/70   Pulse 65   Ht 5\' 9"  (1.753 m)   Wt 238 lb 12.8 oz (108.3 kg)   BMI 35.26 kg/m     Wt Readings from Last 3 Encounters:  08/09/16 238 lb 12.8 oz (108.3 kg)  04/09/15 246 lb 12.8 oz (111.9 kg)  10/07/14 243 lb (110.2 kg)     Physical Exam  Constitutional: He is oriented to person, place, and time. He  appears well-developed and well-nourished. No distress.  HENT:  Head: Normocephalic and atraumatic.  Eyes: No scleral icterus.  Neck: Normal range of motion. No JVD present.  Cardiovascular: Normal rate, regular rhythm, S1 normal and S2 normal.  Exam reveals no gallop and no friction rub.   Murmur heard. High-pitched crescendo-decrescendo systolic murmur is present with a grade of 3/6  at the upper right sternal border Pulmonary/Chest: Effort normal and breath sounds normal. He has no wheezes. He has no rhonchi. He has no rales.  Abdominal: Soft. There is no tenderness.  Musculoskeletal: He exhibits no edema.  Neurological: He is alert and oriented to person, place, and time.  Skin: Skin is warm and dry.  Psychiatric: He has a normal mood and affect.    ASSESSMENT:    1. Coronary artery disease involving native coronary artery of native heart without angina pectoris   2. Aortic stenosis   3. Carotid artery disease, unspecified laterality ([place])   4. Essential hypertension   5. Hyperlipemia    PLAN:    In order of problems listed above:  1. CAD - No angina.  He underwent PCI of the LAD in 2005.  He had diffuse small vessel disease by LHC in 2010.  Last Myoview in 2015 was low risk.  ECG is unchanged.  Continue ASA 81, Crestor, beta-blocker.  2. Aortic stenosis - Bicuspid aortic valve with mod AS by echo in 6/16.  He has vague symptoms of fatigue.  He denies CHF symptoms, angina or syncope.  Will get FU echo.  3. Carotid artery disease - He is followed by [facility] in [facility].  4. HTN - BP is controlled on  current regimen.    5. HL - This is managed by PCP.  Will request most recent Lipid panel.  Dispo:  He does not have CHF and in light of Dr. Claris Gladden move the [facility], I will see him in 6 mos and eventually establish him with Dr. Saunders Revel on our Care Team.  I discussed this with the patient today.    Medication Adjustments/Labs and Tests Ordered: Current medicines are reviewed at length with the patient today.  Concerns regarding medicines are outlined above.  Medication changes, Labs and Tests ordered today are outlined in the Patient Instructions noted below. Patient Instructions  Medication Instructions:  Your physician recommends that you continue on your current medications as directed. Please refer to the Current Medication list given to you today. Labwork: NONE ORDERED IN OUR OFFICE TODAY; HOWEVER I WILL CALL YOUR PCP AND REQUEST MOST RECENT LAB WORK TO BE FAXED TO [place], [place] Testing/Procedures: Your physician has requested that you have an echocardiogram. Echocardiography is a painless test that uses sound waves to create images of your heart. It provides your doctor with information about the size and shape of your heart and how well your heart's chambers and valves are working. This procedure takes approximately one hour. There are no restrictions for this procedure. Follow-Up: Your physician wants you to follow-up in: [place], [facility]  You will receive a reminder letter in the mail two months in advance. If you don't receive a letter, please call our office to schedule the follow-up appointment. Any Other Special Instructions Will Be Listed Below (If Applicable). If you need a refill on your cardiac medications before your next appointment, please call your pharmacy.  Signed, Richardson Dopp, PA-C  08/09/2016 10:01 AM    [facility]  [address], [place], [place]  [address] Phone: [phone]; Fax: [phone]

## 2016-08-09 ENCOUNTER — Encounter: Payer: Self-pay | Admitting: Physician Assistant

## 2016-08-09 ENCOUNTER — Ambulatory Visit (INDEPENDENT_AMBULATORY_CARE_PROVIDER_SITE_OTHER): Payer: 59 | Admitting: Physician Assistant

## 2016-08-09 VITALS — BP 110/70 | HR 65 | Ht 69.0 in | Wt 238.8 lb

## 2016-08-09 DIAGNOSIS — I350 Nonrheumatic aortic (valve) stenosis: Secondary | ICD-10-CM | POA: Diagnosis not present

## 2016-08-09 DIAGNOSIS — E785 Hyperlipidemia, unspecified: Secondary | ICD-10-CM

## 2016-08-09 DIAGNOSIS — I10 Essential (primary) hypertension: Secondary | ICD-10-CM | POA: Diagnosis not present

## 2016-08-09 DIAGNOSIS — I739 Peripheral vascular disease, unspecified: Secondary | ICD-10-CM

## 2016-08-09 DIAGNOSIS — I2510 Atherosclerotic heart disease of native coronary artery without angina pectoris: Secondary | ICD-10-CM

## 2016-08-09 DIAGNOSIS — I779 Disorder of arteries and arterioles, unspecified: Secondary | ICD-10-CM | POA: Diagnosis not present

## 2016-08-09 NOTE — Patient Instructions (Addendum)
Medication Instructions:  Your physician recommends that you continue on your current medications as directed. Please refer to the Current Medication list given to you today. Labwork: NONE ORDERED IN OUR OFFICE TODAY; HOWEVER I WILL CALL YOUR PCP AND REQUEST MOST RECENT LAB WORK TO BE FAXED TO [place], [place] Testing/Procedures: Your physician has requested that you have an echocardiogram. Echocardiography is a painless test that uses sound waves to create images of your heart. It provides your doctor with information about the size and shape of your heart and how well your heart's chambers and valves are working. This procedure takes approximately one hour. There are no restrictions for this procedure. Follow-Up: Your physician wants you to follow-up in: [place], [facility]  You will receive a reminder letter in the mail two months in advance. If you don't receive a letter, please call our office to schedule the follow-up appointment. Any Other Special Instructions Will Be Listed Below (If Applicable). If you need a refill on your cardiac medications before your next appointment, please call your pharmacy.

## 2016-08-22 ENCOUNTER — Other Ambulatory Visit: Payer: Self-pay

## 2016-08-22 ENCOUNTER — Encounter: Payer: Self-pay | Admitting: Physician Assistant

## 2016-08-22 ENCOUNTER — Ambulatory Visit (HOSPITAL_COMMUNITY): Payer: 59 | Attending: Internal Medicine

## 2016-08-22 DIAGNOSIS — E119 Type 2 diabetes mellitus without complications: Secondary | ICD-10-CM | POA: Diagnosis not present

## 2016-08-22 DIAGNOSIS — I2510 Atherosclerotic heart disease of native coronary artery without angina pectoris: Secondary | ICD-10-CM | POA: Diagnosis not present

## 2016-08-22 DIAGNOSIS — E785 Hyperlipidemia, unspecified: Secondary | ICD-10-CM | POA: Insufficient documentation

## 2016-08-22 DIAGNOSIS — I517 Cardiomegaly: Secondary | ICD-10-CM | POA: Diagnosis not present

## 2016-08-22 DIAGNOSIS — G4733 Obstructive sleep apnea (adult) (pediatric): Secondary | ICD-10-CM | POA: Diagnosis not present

## 2016-08-22 DIAGNOSIS — I350 Nonrheumatic aortic (valve) stenosis: Secondary | ICD-10-CM

## 2016-08-22 DIAGNOSIS — I358 Other nonrheumatic aortic valve disorders: Secondary | ICD-10-CM | POA: Diagnosis not present

## 2016-08-23 ENCOUNTER — Telehealth: Payer: Self-pay | Admitting: *Deleted

## 2016-08-23 NOTE — Telephone Encounter (Signed)
DPR for wife. Wife notified of echo results and findings of echo results for pt. Repeat echo to be done in about 1 year. Wife has been advised our office will call if any further recommendations from Dr. Aundra Dubin. I advised wife that if pt has any questions tcb [phone]. Wife agreeable to plan of care for pt.

## 2016-08-25 ENCOUNTER — Telehealth: Payer: Self-pay | Admitting: *Deleted

## 2016-08-25 DIAGNOSIS — I739 Peripheral vascular disease, unspecified: Principal | ICD-10-CM

## 2016-08-25 DIAGNOSIS — I779 Disorder of arteries and arterioles, unspecified: Secondary | ICD-10-CM

## 2016-08-25 DIAGNOSIS — I350 Nonrheumatic aortic (valve) stenosis: Secondary | ICD-10-CM

## 2016-08-25 DIAGNOSIS — I2510 Atherosclerotic heart disease of native coronary artery without angina pectoris: Secondary | ICD-10-CM

## 2016-08-25 NOTE — Telephone Encounter (Signed)
DPR for pt's wife, who has been notified of Dr. Aundra Dubin recommendations as well to repeat echo in 1 year for pt.

## 2016-09-13 ENCOUNTER — Other Ambulatory Visit: Payer: Self-pay | Admitting: Cardiology

## 2016-10-24 DIAGNOSIS — Z9989 Dependence on other enabling machines and devices: Secondary | ICD-10-CM | POA: Diagnosis not present

## 2016-10-24 DIAGNOSIS — G4733 Obstructive sleep apnea (adult) (pediatric): Secondary | ICD-10-CM | POA: Diagnosis not present

## 2016-11-18 DIAGNOSIS — Z125 Encounter for screening for malignant neoplasm of prostate: Secondary | ICD-10-CM | POA: Diagnosis not present

## 2016-11-18 DIAGNOSIS — E7801 Familial hypercholesterolemia: Secondary | ICD-10-CM | POA: Diagnosis not present

## 2016-11-18 DIAGNOSIS — E118 Type 2 diabetes mellitus with unspecified complications: Secondary | ICD-10-CM | POA: Diagnosis not present

## 2017-01-06 DIAGNOSIS — N529 Male erectile dysfunction, unspecified: Secondary | ICD-10-CM | POA: Diagnosis not present

## 2017-01-06 DIAGNOSIS — E118 Type 2 diabetes mellitus with unspecified complications: Secondary | ICD-10-CM | POA: Diagnosis not present

## 2017-01-06 DIAGNOSIS — M79605 Pain in left leg: Secondary | ICD-10-CM | POA: Diagnosis not present

## 2017-01-06 DIAGNOSIS — K2270 Barrett's esophagus without dysplasia: Secondary | ICD-10-CM | POA: Diagnosis not present

## 2017-01-06 DIAGNOSIS — I2510 Atherosclerotic heart disease of native coronary artery without angina pectoris: Secondary | ICD-10-CM | POA: Diagnosis not present

## 2017-01-06 DIAGNOSIS — I10 Essential (primary) hypertension: Secondary | ICD-10-CM | POA: Diagnosis not present

## 2017-01-06 DIAGNOSIS — E669 Obesity, unspecified: Secondary | ICD-10-CM | POA: Diagnosis not present

## 2017-01-06 DIAGNOSIS — K219 Gastro-esophageal reflux disease without esophagitis: Secondary | ICD-10-CM | POA: Diagnosis not present

## 2017-01-06 DIAGNOSIS — E7800 Pure hypercholesterolemia, unspecified: Secondary | ICD-10-CM | POA: Diagnosis not present

## 2017-01-06 DIAGNOSIS — M79604 Pain in right leg: Secondary | ICD-10-CM | POA: Diagnosis not present

## 2017-01-06 DIAGNOSIS — Z23 Encounter for immunization: Secondary | ICD-10-CM | POA: Diagnosis not present

## 2017-01-06 DIAGNOSIS — I6523 Occlusion and stenosis of bilateral carotid arteries: Secondary | ICD-10-CM | POA: Diagnosis not present

## 2017-01-06 DIAGNOSIS — Z9989 Dependence on other enabling machines and devices: Secondary | ICD-10-CM | POA: Insufficient documentation

## 2017-01-06 DIAGNOSIS — Z0000 Encounter for general adult medical examination without abnormal findings: Secondary | ICD-10-CM | POA: Diagnosis not present

## 2017-01-06 DIAGNOSIS — G4733 Obstructive sleep apnea (adult) (pediatric): Secondary | ICD-10-CM | POA: Diagnosis not present

## 2017-01-26 DIAGNOSIS — I70209 Unspecified atherosclerosis of native arteries of extremities, unspecified extremity: Secondary | ICD-10-CM | POA: Diagnosis not present

## 2017-01-26 DIAGNOSIS — I6523 Occlusion and stenosis of bilateral carotid arteries: Secondary | ICD-10-CM | POA: Diagnosis not present

## 2017-02-02 DIAGNOSIS — M79604 Pain in right leg: Secondary | ICD-10-CM | POA: Diagnosis not present

## 2017-02-02 DIAGNOSIS — M545 Low back pain: Secondary | ICD-10-CM | POA: Diagnosis not present

## 2017-02-07 ENCOUNTER — Encounter: Payer: Self-pay | Admitting: Physician Assistant

## 2017-02-07 NOTE — Progress Notes (Signed)
Cardiology Office Note:    Date:  02/08/2017   ID:  Theodore Jenkins, DOB 1945-12-23, MRN [id number]  PCP:  Theodore Einstein, MD  Cardiologist:  Dr. Loralie Jenkins   Electrophysiologist:  n/a  Referring MD: Theodore Einstein, MD   Chief Complaint  Patient presents with  . Follow-up    CAD, aortic stenosis    History of Present Illness:    Theodore Jenkins is a 71 y.o. male with a hx of CAD s/p PCI to LAD in 2005, carotid artery disease s/p R CEA followed by RICA occlusion, moderate aortic stenosis, Diabetes.  Cardiac catheterization in 2010 apparently demonstrated (I cannot locate the report in the chart) diffuse CAD with an occluded distal portion of a small PDA filled by right collaterals and OM1 60-70% stenosis. Myoview in 2015 was low risk.  Echo in 6/16 demonstrated normal LVEF and mod AS with mean AV gradient 32 mmHg.    Last seen by me in 8/17.  FU echo demonstrated mod to severe AS with mean gradient 36 mmHg.  He returns for follow up.  He is here today with his wife. He has had right leg pain recently. He follows up with his vascular surgeon next week. He had a recent duplex that was negative for DVT. He also had a lumbar spine x-ray as well as arterial Dopplers. He has fairly continuous discomfort that is worse with exertion. He denies any ulcers on his feet. He had one episode of chest discomfort back in November with exertion. He did not take nitroglycerin. He had no associated symptoms. He has not had a recurrence since that time. He remains fairly active. He denies significant dyspnea. He sleeps with CPAP at night. Denies syncope. Denies edema. He does get dizzy with standing. This has been a chronic issue for a long time.  Prior CV studies:   The following studies were reviewed today:  Echo 9/17:  mild LVH, EF 55-60%, Gr 1 DD, mod to severe AS, mean 36 mmHg, peak 55 mmHg, mild LAE  Echo 6/16 Mild concentric LVH, EF 123456, grade 1 diastolic dysfunction, bicuspid aortic valve, moderate  aortic stenosis, mean gradient 32 mmHg, peak gradient 52 mmHg, mild LAE  Myoview 10/15 Overall Impression: Normal stress nuclear study.  LV Ejection Fraction: 51%. LV Wall Motion: Normal Wall Motion  LHC 12/05 LM normal. LCx with 60-70% narrowing in the first marginal.  LAD distal severe 90-95%, then 60-70%  RCA with PDA that was very diffusely diseased with severe segmental and diffuse narrowings. PCI: 2.5 x 18 mm Cypher DES x 2 to distal LAD  Past Medical History:  Diagnosis Date  . Aortic stenosis    a. Echo 9/17: mild LVH, EF 55-60%, Gr 1 DD, mod to severe AS, mean 36 mmHg, peak 55 mmHg, mild LAE  . Barrett's esophagus   . Diabetes mellitus   . ED (erectile dysfunction)   . GERD (gastroesophageal reflux disease)   . Hyperlipidemia   . Hypertension   . IHD (ischemic heart disease)    prior stenting of the LAD in December of 2005; with last cath in 2010 showing diffuse CAD with normal LV function. He is managed medically.   . Obesity   . Obstructive sleep apnea on CPAP   . S/P coronary artery stent placement 2005   LAD  1. OSA: On CPAP.  2. GERD with Barretts esophagus 3. CAD: PCI to LAD in 2005. LHC in 2010 with diffuse CAD managed medically. Cardiolite in  6/14 with EF 52%, no ischemia or infarction. Lexiscan Cardiolite (10/15) with EF 51%, no ischemia or infarction.  4. Hyperlipidemia 5. HTN 6. Erectile dysfunction. 7. Type II diabetes 8. Carotid stenosis: Right CEA then occlusion of the right carotid shortly thereafter. He sees a Hydrographic surveyor yearly in [employer].  9. Aortic stenosis: Echo (6/14) with EF 60-65%, moderate AS with mean gradient 30 mmHg, mildly dilated RV. Echo (6/15) with EF 55-60%, severe LVH, moderate AS with mean gradient 27 mmHg and AVA 1.11 cm^2.   Past Surgical History:  Procedure Laterality Date  . CARDIAC CATHETERIZATION  2010   NORMAL LV FUNCTION. DIFFUSE CORONARY DISEASE WITH THE DISTAL PORTION OF THE SMALL POSTERIOR DESCENDING  VESSEL OCCLUDED AND FILLING WITH THE RIGHT COLLATERALS, 60-70% STENOSIS IN THE FIRST OBTUSE MARGINAL VESSEL AND DIFFUSE SMALL VESSEL DISEASE  . CIRCUMCISION    . CORONARY STENT PLACEMENT  2005    Current Medications: Current Meds  Medication Sig  . aspirin 81 MG tablet Take 81 mg by mouth daily.   . cetirizine (ZYRTEC) 10 MG tablet Take 10 mg by mouth as needed for allergies or rhinitis.   . metoprolol succinate (TOPROL-XL) 50 MG 24 hr tablet TAKE 1 TABLET BY MOUTH  DAILY WITH A MEAL OR  IMMEDIATELY FOLLOWING A  MEAL  . nitroGLYCERIN (NITROSTAT) 0.4 MG SL tablet Place 0.4 mg under the tongue every 5 (five) minutes as needed for chest pain.  [name] omeprazole (PRILOSEC OTC) 20 MG tablet Take 20 mg by mouth daily.    . rosuvastatin (CRESTOR) 10 MG tablet TAKE 1 TABLET BY MOUTH  DAILY.  . sitaGLIPtin-metformin (JANUMET) 50-1000 MG tablet Take 1 tablet by mouth 2 (two) times daily with a meal.   . [DISCONTINUED] lisinopril (PRINIVIL,ZESTRIL) 10 MG tablet TAKE 1 TABLET BY MOUTH  DAILY.     Allergies:   Patient has no known allergies.   Social History   Social History  . Marital status: Married    Spouse name: N/A  . Number of children: N/A  . Years of education: N/A   Social History Main Topics  . Smoking status: Former Smoker    Quit date: 12/12/1982  . Smokeless tobacco: Never Used  . Alcohol use No  . Drug use: No  . Sexual activity: Not Currently   Other Topics Concern  . None   Social History Narrative  . None     Family History  Problem Relation Age of Onset  . Cancer Mother   . Heart attack Father 56     ROS:   Please see the history of present illness.    Review of Systems  Eyes: Positive for visual disturbance.  Hematologic/Lymphatic: Bruises/bleeds easily.  Musculoskeletal: Positive for joint pain.  Neurological: Positive for dizziness.   All other systems reviewed and are negative.   EKGs/Labs/Other Test Reviewed:    EKG:  EKG is  ordered today.  The ekg  ordered today demonstrates NSR, HR 70, normal axis, nonspecific ST-T wave changes, first-degree AV block (PR 226), QTc 423, no significant change since prior tracings  Recent Labs: No results found for requested labs within last 8760 hours.   Recent Lipid Panel    Component Value Date/Time   CHOL 105 05/07/2013 0854   TRIG 110.0 05/07/2013 0854   HDL 35.80 (L) 05/07/2013 0854   CHOLHDL 3 05/07/2013 0854   VLDL 22.0 05/07/2013 0854   LDLCALC 47 05/07/2013 0854     Physical Exam:    VS:  BP (!) 118/54   Pulse 67   Ht 5\' 9"  (1.753 m)   Wt 233 lb 12.8 oz (106.1 kg)   BMI 34.53 kg/m     Wt Readings from Last 3 Encounters:  02/08/17 233 lb 12.8 oz (106.1 kg)  08/09/16 238 lb 12.8 oz (108.3 kg)  04/09/15 246 lb 12.8 oz (111.9 kg)     Physical Exam  Constitutional: He is oriented to person, place, and time. He appears well-developed and well-nourished. No distress.  HENT:  Head: Normocephalic and atraumatic.  Eyes: No scleral icterus.  Neck: No JVD present.  Cardiovascular: Normal rate, regular rhythm, S1 normal and S2 normal.   Murmur heard.  Harsh crescendo-decrescendo systolic murmur is present with a grade of 2/6  at the upper right sternal border Pulmonary/Chest: Effort normal. He has no wheezes. He has no rales.  Abdominal: Soft. There is no tenderness.  Musculoskeletal: He exhibits no edema.  Neurological: He is alert and oriented to person, place, and time.  Skin: Skin is warm and dry.  Psychiatric: He has a normal mood and affect.    ASSESSMENT:    1. Chest pain, unspecified type   2. Coronary artery disease involving native coronary artery of native heart without angina pectoris   3. Nonrheumatic aortic valve stenosis   4. Carotid artery disease, unspecified laterality ([place])   5. Dizziness   6. Other hyperlipidemia   7. Pain of right lower extremity    PLAN:    In order of problems listed above:  1. Chest pain - He had one episode of chest discomfort  back in November without recurrence. He denies exertional symptoms since that time. His ECG is unchanged. His PCI was done in 2005 and his last nuclear stress test was over 2 years ago. He has had some recent leg pain and follows up with vascular surgery next week.  -  Arrange Lexiscan Myoview  2. CAD - s/p PCI of the LAD in 2005.  He had diffuse small vessel disease by LHC in 2010.  Last Myoview in 2015 was low risk.   he had just one episode of chest discomfort back in November. However, it has been a long time since his last assessment for ischemia. He is a diabetic. Proceed with Myoview as noted. Continue aspirin, beta blocker, statin.  3. Aortic stenosis - Bicuspid aortic valve with mod to severe aortic stenosis. Repeat echocardiogram pending in 08/2017.  4. Carotid artery disease - He is followed by [facility] in [facility].  5. Dizziness - Blood pressure is well controlled. I checked his blood pressure myself from sitting to standing and it dropped from 123456 systolic sitting to [id number] standing. I will decrease his lisinopril to 5 mg daily. I have asked him to wear compression stockings. Some of his dizzy spells have been severe. If these continue despite management of orthostatic intolerance, I would consider proceeding with an event monitor.  6. HL - Followed by primary care.  Recent LDL 73.  He has had intolerance to other statins in the past. Continue current dose of Crestor.  7. Leg pain - He has had arterial dopplers and sees vascular surgery next week.  His symptoms are somewhat suspicious for claudication.     Medication Adjustments/Labs and Tests Ordered: Current medicines are reviewed at length with the patient today.  Concerns regarding medicines are outlined above.  Medication changes, Labs and Tests ordered today are outlined in the Patient Instructions noted below. Patient Instructions  Medication Instructions:  1. DECREASE LISINOPRIL TO 5 MG DAILY (THIS WILL BE  1/2 TABLET A DAY OF THE 10 MG TABLET)  Labwork: NONE  Testing/Procedures: 1. Your physician has requested that you have a lexiscan myoview. For further information please visit [email or web address]. Please follow instruction sheet, as given.  2. Your physician has requested that you have an echocardiogram. Echocardiography is a painless test that uses sound waves to create images of your heart. It provides your doctor with information about the size and shape of your heart and how well your heart's chambers and valves are working. This procedure takes approximately one hour. There are no restrictions for this procedure. THIS IS TO BE DONE 08/2017 BEFORE YOU SEE DR. END   Follow-Up: DR. END 08/2017; WE WILL SEND OUT A REMINDER LETTER A COUPLE OF MONTHS EARLIER TO HAVE YOU  CALL AND MAKE APPT  Any Other Special Instructions Will Be Listed Below (If Applicable). 1. CALL IF YOUR BLOOD PRESSURE IS STAYING ABOVE 140/90 2. CALL IF YOUR DIZZINESS CONTINUES 3. PICK UP SOME OTC COMPRESSION STOCKINGS  AND WEAR DAILY FROM THE TIME YOU WAKE UP TILL BED TIME. TAKE THEM OFF BEFORE GOING TO BED AT NIGHT.  If you need a refill on your cardiac medications before your next appointment, please call your pharmacy.   Return in about 6 months (around 08/08/2017) for Routine Follow Up, w/ Dr. Saunders Revel.   Signed, Richardson Dopp, PA-C  02/08/2017 10:49 AM    [facility] [address], [place], [place]  [address] Phone: [phone]; Fax: [phone]

## 2017-02-08 ENCOUNTER — Ambulatory Visit (INDEPENDENT_AMBULATORY_CARE_PROVIDER_SITE_OTHER): Payer: PPO | Admitting: Physician Assistant

## 2017-02-08 ENCOUNTER — Encounter: Payer: Self-pay | Admitting: Physician Assistant

## 2017-02-08 VITALS — BP 118/54 | HR 67 | Ht 69.0 in | Wt 233.8 lb

## 2017-02-08 DIAGNOSIS — R42 Dizziness and giddiness: Secondary | ICD-10-CM

## 2017-02-08 DIAGNOSIS — I350 Nonrheumatic aortic (valve) stenosis: Secondary | ICD-10-CM

## 2017-02-08 DIAGNOSIS — E784 Other hyperlipidemia: Secondary | ICD-10-CM

## 2017-02-08 DIAGNOSIS — I2510 Atherosclerotic heart disease of native coronary artery without angina pectoris: Secondary | ICD-10-CM | POA: Diagnosis not present

## 2017-02-08 DIAGNOSIS — I779 Disorder of arteries and arterioles, unspecified: Secondary | ICD-10-CM

## 2017-02-08 DIAGNOSIS — M79604 Pain in right leg: Secondary | ICD-10-CM | POA: Diagnosis not present

## 2017-02-08 DIAGNOSIS — E7849 Other hyperlipidemia: Secondary | ICD-10-CM

## 2017-02-08 DIAGNOSIS — I739 Peripheral vascular disease, unspecified: Secondary | ICD-10-CM

## 2017-02-08 DIAGNOSIS — R079 Chest pain, unspecified: Secondary | ICD-10-CM

## 2017-02-08 MED ORDER — LISINOPRIL 10 MG PO TABS
5.0000 mg | ORAL_TABLET | Freq: Every day | ORAL | 3 refills | Status: DC
Start: 2017-02-08 — End: 2018-08-20

## 2017-02-08 NOTE — Patient Instructions (Addendum)
Medication Instructions:  1. DECREASE LISINOPRIL TO 5 MG DAILY (THIS WILL BE 1/2 TABLET A DAY OF THE 10 MG TABLET)  Labwork: NONE  Testing/Procedures: 1. Your physician has requested that you have a lexiscan myoview. For further information please visit [email or web address]. Please follow instruction sheet, as given.  2. Your physician has requested that you have an echocardiogram. Echocardiography is a painless test that uses sound waves to create images of your heart. It provides your doctor with information about the size and shape of your heart and how well your heart's chambers and valves are working. This procedure takes approximately one hour. There are no restrictions for this procedure. THIS IS TO BE DONE 08/2017 BEFORE YOU SEE DR. END   Follow-Up: DR. END 08/2017; WE WILL SEND OUT A REMINDER LETTER A COUPLE OF MONTHS EARLIER TO HAVE YOU  CALL AND MAKE APPT  Any Other Special Instructions Will Be Listed Below (If Applicable). 1. CALL IF YOUR BLOOD PRESSURE IS STAYING ABOVE 140/90 2. CALL IF YOUR DIZZINESS CONTINUES 3. PICK UP SOME OTC COMPRESSION STOCKINGS  AND WEAR DAILY FROM THE TIME YOU WAKE UP TILL BED TIME. TAKE THEM OFF BEFORE GOING TO BED AT NIGHT.  If you need a refill on your cardiac medications before your next appointment, please call your pharmacy.

## 2017-02-09 ENCOUNTER — Telehealth (HOSPITAL_COMMUNITY): Payer: Self-pay | Admitting: *Deleted

## 2017-02-09 NOTE — Telephone Encounter (Signed)
Patient given detailed instructions per Myocardial Perfusion Study Information Sheet for the test on 02/10/17. Patient notified to arrive 15 minutes early and that it is imperative to arrive on time for appointment to keep from having the test rescheduled.  If you need to cancel or reschedule your appointment, please call the office within 24 hours of your appointment. Failure to do so may result in a cancellation of your appointment, and a $50 no show fee. Patient verbalized understanding. Kirstie Peri

## 2017-02-13 DIAGNOSIS — I70203 Unspecified atherosclerosis of native arteries of extremities, bilateral legs: Secondary | ICD-10-CM | POA: Diagnosis not present

## 2017-02-13 DIAGNOSIS — I714 Abdominal aortic aneurysm, without rupture: Secondary | ICD-10-CM | POA: Diagnosis not present

## 2017-02-13 DIAGNOSIS — I6523 Occlusion and stenosis of bilateral carotid arteries: Secondary | ICD-10-CM | POA: Diagnosis not present

## 2017-02-13 DIAGNOSIS — I10 Essential (primary) hypertension: Secondary | ICD-10-CM | POA: Diagnosis not present

## 2017-02-13 DIAGNOSIS — E118 Type 2 diabetes mellitus with unspecified complications: Secondary | ICD-10-CM | POA: Diagnosis not present

## 2017-02-13 DIAGNOSIS — M79604 Pain in right leg: Secondary | ICD-10-CM | POA: Diagnosis not present

## 2017-02-14 ENCOUNTER — Ambulatory Visit (HOSPITAL_COMMUNITY): Payer: PPO | Attending: Cardiology

## 2017-02-14 ENCOUNTER — Encounter: Payer: Self-pay | Admitting: Physician Assistant

## 2017-02-14 ENCOUNTER — Telehealth: Payer: Self-pay | Admitting: *Deleted

## 2017-02-14 DIAGNOSIS — R079 Chest pain, unspecified: Secondary | ICD-10-CM | POA: Diagnosis not present

## 2017-02-14 DIAGNOSIS — I2510 Atherosclerotic heart disease of native coronary artery without angina pectoris: Secondary | ICD-10-CM | POA: Diagnosis not present

## 2017-02-14 LAB — MYOCARDIAL PERFUSION IMAGING
LV dias vol: 143 mL (ref 62–150)
LV sys vol: 65 mL
Peak HR: 76 {beats}/min
RATE: 0.33
Rest HR: 64 {beats}/min
SDS: 1
SRS: 0
SSS: 1
TID: 1.15

## 2017-02-14 MED ORDER — TECHNETIUM TC 99M TETROFOSMIN IV KIT
33.0000 | PACK | Freq: Once | INTRAVENOUS | Status: AC | PRN
  Administered 2017-02-14: 33 via INTRAVENOUS
  Filled 2017-02-14: qty 33

## 2017-02-14 MED ORDER — REGADENOSON 0.4 MG/5ML IV SOLN
0.4000 mg | Freq: Once | INTRAVENOUS | Status: AC
  Administered 2017-02-14: 0.4 mg via INTRAVENOUS

## 2017-02-14 MED ORDER — TECHNETIUM TC 99M TETROFOSMIN IV KIT
9.8000 | PACK | Freq: Once | INTRAVENOUS | Status: AC | PRN
  Administered 2017-02-14: 9.8 via INTRAVENOUS
  Filled 2017-02-14: qty 10

## 2017-02-14 NOTE — Telephone Encounter (Signed)
Lmtcb to go over Myoview results.  

## 2017-02-15 NOTE — Telephone Encounter (Signed)
Pt notified of Myoview results by phone with verbal understanding. I will fax results to PCP as well. Pt agreeable to plan of care.

## 2017-02-20 DIAGNOSIS — M5431 Sciatica, right side: Secondary | ICD-10-CM | POA: Diagnosis not present

## 2017-02-20 DIAGNOSIS — M79604 Pain in right leg: Secondary | ICD-10-CM | POA: Diagnosis not present

## 2017-03-02 DIAGNOSIS — Z79899 Other long term (current) drug therapy: Secondary | ICD-10-CM | POA: Diagnosis not present

## 2017-03-02 DIAGNOSIS — M5441 Lumbago with sciatica, right side: Secondary | ICD-10-CM | POA: Diagnosis not present

## 2017-03-02 DIAGNOSIS — G8929 Other chronic pain: Secondary | ICD-10-CM | POA: Diagnosis not present

## 2017-03-03 DIAGNOSIS — G4733 Obstructive sleep apnea (adult) (pediatric): Secondary | ICD-10-CM | POA: Diagnosis not present

## 2017-03-17 DIAGNOSIS — M5137 Other intervertebral disc degeneration, lumbosacral region: Secondary | ICD-10-CM | POA: Diagnosis not present

## 2017-03-23 DIAGNOSIS — Z79899 Other long term (current) drug therapy: Secondary | ICD-10-CM | POA: Diagnosis not present

## 2017-03-23 DIAGNOSIS — M5416 Radiculopathy, lumbar region: Secondary | ICD-10-CM | POA: Diagnosis not present

## 2017-04-21 DIAGNOSIS — M5417 Radiculopathy, lumbosacral region: Secondary | ICD-10-CM | POA: Diagnosis not present

## 2017-04-21 DIAGNOSIS — M5416 Radiculopathy, lumbar region: Secondary | ICD-10-CM | POA: Diagnosis not present

## 2017-05-24 DIAGNOSIS — M545 Low back pain: Secondary | ICD-10-CM | POA: Diagnosis not present

## 2017-05-24 DIAGNOSIS — G8929 Other chronic pain: Secondary | ICD-10-CM | POA: Diagnosis not present

## 2017-07-03 DIAGNOSIS — E118 Type 2 diabetes mellitus with unspecified complications: Secondary | ICD-10-CM | POA: Diagnosis not present

## 2017-07-03 DIAGNOSIS — I10 Essential (primary) hypertension: Secondary | ICD-10-CM | POA: Diagnosis not present

## 2017-07-03 DIAGNOSIS — E7800 Pure hypercholesterolemia, unspecified: Secondary | ICD-10-CM | POA: Diagnosis not present

## 2017-07-06 DIAGNOSIS — E118 Type 2 diabetes mellitus with unspecified complications: Secondary | ICD-10-CM | POA: Diagnosis not present

## 2017-07-06 DIAGNOSIS — G4733 Obstructive sleep apnea (adult) (pediatric): Secondary | ICD-10-CM | POA: Diagnosis not present

## 2017-07-06 DIAGNOSIS — I2510 Atherosclerotic heart disease of native coronary artery without angina pectoris: Secondary | ICD-10-CM | POA: Diagnosis not present

## 2017-07-06 DIAGNOSIS — I10 Essential (primary) hypertension: Secondary | ICD-10-CM | POA: Diagnosis not present

## 2017-08-15 ENCOUNTER — Ambulatory Visit (HOSPITAL_COMMUNITY): Payer: PPO | Attending: Cardiology

## 2017-08-15 ENCOUNTER — Telehealth: Payer: Self-pay | Admitting: *Deleted

## 2017-08-15 ENCOUNTER — Encounter: Payer: Self-pay | Admitting: Physician Assistant

## 2017-08-15 ENCOUNTER — Other Ambulatory Visit: Payer: Self-pay

## 2017-08-15 DIAGNOSIS — Z6834 Body mass index (BMI) 34.0-34.9, adult: Secondary | ICD-10-CM | POA: Diagnosis not present

## 2017-08-15 DIAGNOSIS — E119 Type 2 diabetes mellitus without complications: Secondary | ICD-10-CM | POA: Diagnosis not present

## 2017-08-15 DIAGNOSIS — I10 Essential (primary) hypertension: Secondary | ICD-10-CM | POA: Diagnosis not present

## 2017-08-15 DIAGNOSIS — E785 Hyperlipidemia, unspecified: Secondary | ICD-10-CM | POA: Diagnosis not present

## 2017-08-15 DIAGNOSIS — I350 Nonrheumatic aortic (valve) stenosis: Secondary | ICD-10-CM | POA: Diagnosis not present

## 2017-08-15 DIAGNOSIS — I2510 Atherosclerotic heart disease of native coronary artery without angina pectoris: Secondary | ICD-10-CM | POA: Diagnosis not present

## 2017-08-15 NOTE — Telephone Encounter (Signed)
Pt has been notified echo results and findings by phone with verbal understanding. Pt aware to keep f/u on 08/18/17 with DR. End who will discuss echo in further detail with the pt. Pt thanked me for my call today.

## 2017-08-15 NOTE — Telephone Encounter (Signed)
-----   Message from Liliane Shi, [place] sent at 08/15/2017  5:00 PM EDT ----- Please call the patient. The echocardiogram shows that his aortic stenosis is worsening.   He has an appointment on Friday with Dr. Harrell Gave End.  Keep this appointment to discuss and review further recommendations.   Please fax a copy of this study result to his PCP:  Haimes, Youlanda Roys, MD  Thanks! Richardson Dopp, PA-C    08/15/2017 4:57 PM

## 2017-08-18 ENCOUNTER — Encounter: Payer: Self-pay | Admitting: Internal Medicine

## 2017-08-18 ENCOUNTER — Ambulatory Visit (INDEPENDENT_AMBULATORY_CARE_PROVIDER_SITE_OTHER): Payer: PPO | Admitting: Internal Medicine

## 2017-08-18 VITALS — BP 124/60 | HR 68 | Ht 69.0 in | Wt 236.0 lb

## 2017-08-18 DIAGNOSIS — I350 Nonrheumatic aortic (valve) stenosis: Secondary | ICD-10-CM

## 2017-08-18 DIAGNOSIS — I200 Unstable angina: Secondary | ICD-10-CM

## 2017-08-18 DIAGNOSIS — E785 Hyperlipidemia, unspecified: Secondary | ICD-10-CM | POA: Diagnosis not present

## 2017-08-18 DIAGNOSIS — I10 Essential (primary) hypertension: Secondary | ICD-10-CM

## 2017-08-18 DIAGNOSIS — I208 Other forms of angina pectoris: Secondary | ICD-10-CM | POA: Diagnosis not present

## 2017-08-18 LAB — CBC
Hematocrit: 38.3 % (ref 37.5–51.0)
Hemoglobin: 13.1 g/dL (ref 13.0–17.7)
MCH: 32 pg (ref 26.6–33.0)
MCHC: 34.2 g/dL (ref 31.5–35.7)
MCV: 94 fL (ref 79–97)
Platelets: 169 10*3/uL (ref 150–379)
RBC: 4.09 x10E6/uL — ABNORMAL LOW (ref 4.14–5.80)
RDW: 13.6 % (ref 12.3–15.4)
WBC: 6.8 10*3/uL (ref 3.4–10.8)

## 2017-08-18 LAB — BASIC METABOLIC PANEL
BUN/Creatinine Ratio: 19 (ref 10–24)
BUN: 22 mg/dL (ref 8–27)
CO2: 19 mmol/L — ABNORMAL LOW (ref 20–29)
Calcium: 9.3 mg/dL (ref 8.6–10.2)
Chloride: 98 mmol/L (ref 96–106)
Creatinine, Ser: 1.18 mg/dL (ref 0.76–1.27)
GFR calc Af Amer: 73 mL/min/{1.73_m2} (ref >59)
GFR calc non Af Amer: 63 mL/min/{1.73_m2} (ref >59)
Glucose: 164 mg/dL — ABNORMAL HIGH (ref 65–99)
Potassium: 4.2 mmol/L (ref 3.5–5.2)
Sodium: 133 mmol/L — ABNORMAL LOW (ref 134–144)

## 2017-08-18 LAB — PROTIME-INR
INR: 1 (ref 0.8–1.2)
Prothrombin Time: 10.7 s (ref 9.1–12.0)

## 2017-08-18 NOTE — Patient Instructions (Signed)
Medication Instructions:  Your physician recommends that you continue on your current medications as directed. Please refer to the Current Medication list given to you today.   Labwork: LABS TODAY: CBC, BMET, PT/INR  Testing/Procedures: Your physician has requested that you have a cardiac catheterization. Cardiac catheterization is used to diagnose and/or treat various heart conditions. Doctors may recommend this procedure for a number of different reasons. The most common reason is to evaluate chest pain. Chest pain can be a symptom of coronary artery disease (CAD), and cardiac catheterization can show whether plaque is narrowing or blocking your heart's arteries. This procedure is also used to evaluate the valves, as well as measure the blood flow and oxygen levels in different parts of your heart. For further information please visit [email or web address]. Please follow instruction sheet, as given.  Follow-Up: Your physician recommends that you schedule a follow-up appointment in: 2 weeks after heart catheterization with APP on Dr. Darnelle Bos team.  You have been referred to Dr. Angelena Form or Dr. Burt Knack for TAVR evaluation.    Any Other Special Instructions Will Be Listed Below (If Applicable).    [facility] [place] [facility] [address] [place] [address] [place] [address] Dept: [place]: [place]  08/18/2017  You are scheduled for a Cardiac Catheterization on Friday, September 14 with Dr. Harrell Gave End.  1. Please arrive at the [facility] (Main Entrance A) at [facility]: [place], [place] [address] at 10:00 AM (two hours before your procedure to ensure your preparation). Free valet parking service is available.   Special note: Every effort is made to have your procedure done on time. Please understand that emergencies sometimes delay scheduled procedures.  2. Diet: Do not  eat or drink anything after midnight prior to your procedure except sips of water to take medications.  3. Labs: LABS TODAY  4. Medication instructions in preparation for your procedure:  Do not take your sitagliptin-metformin (Janumet) 24 hours prior to your procedure and 48 hours after.  On the morning of your procedure, take your Aspirin and any morning medicines NOT listed above.  You may use sips of water.  5. Plan for one night stay--bring personal belongings. 6. Bring a current list of your medications and current insurance cards. 7. You MUST have a responsible person to drive you home. 8. Someone MUST be with you the first 24 hours after you arrive home or your discharge will be delayed. 9. Please wear clothes that are easy to get on and off and wear slip-on shoes.  Thank you for allowing [place] to care for you!   -- [facility]    If you need a refill on your cardiac medications before your next appointment, please call your pharmacy.

## 2017-08-18 NOTE — Progress Notes (Signed)
Follow-up Outpatient Visit Date: 08/18/2017  Primary Care Provider: Karlene Einstein, [place] [address]  Chief Complaint: Chest pain  HPI:  Theodore Jenkins is a 71 y.o. year-old male with history of coronary artery disease status post PCI to the LAD 2005, aortic stenosis, cerebrovascular disease status post right carotid endarterectomy with subsequent right internal carotid artery occlusion, and diabetes mellitus who presents for follow-up of CAD and aortic stenosis. He was last seen in February by Theodore Dopp, PA, having previously been followed by Theodore Jenkins. Over the last few months, Theodore Jenkins has experienced increasing chest pain with walking that begins after going only 50-60 feet. This is most pronounced last week when at the beach. He notes accompanying shortness of breath. The symptoms typically resolve after resting for about 10 minutes. He has not taken sublingual nitroglycerin. He denies lightheadedness and syncope but reports that he. He has not had any orthopnea, PND, or edema. He wears CPAP every night.  Of note, Theodore Jenkins denies any chest pain at the time of his PCI in 2005. He reports that catheterization was performed because he "flunked a stress test."  --------------------------------------------------------------------------------------------------  Cardiovascular History & Procedures: Cardiovascular Problems:  Coronary artery disease  Aortic stenosis  Cerebrovascular disease  Risk Factors:  Known coronary artery disease, cerebrovascular disease, hypertension, hyperlipidemia, diabetes mellitus.  Cath/PCI:  LHC/PCI (11/12/04): LMCA normal. LAD with diffuse luminal irregularities with 60-70% mid and 90-95% distal disease. LCx with 2 large OM branches and 60-70% stenosis involving OM1. Large, dominant RCA with diffuse luminal irregularities. RPDA with severe diffuse disease. Successful PCI to mid and distal LAD with placement of overlapping Cypher  stents.  CV Surgery:  Right carotid endarterectomy (10/29/02)  EP Procedures and Devices:  None  Non-Invasive Evaluation(s):  TTE (08/15/17): Normal LV size with mild LVH. LVEF 55-60% with grade 1 diastolic dysfunction. Severe aortic stenosis (mean gradient 42 mmHg) with trace aortic regurgitation. Mitral annular calcification. Mild aortic root enlargement. Mild left atrial enlargement. Normal RV size and function.  Pharmacologic MPI (02/14/17): Low risk study without evidence of ischemia or scar. LVEF 55%.  Recent CV Pertinent Labs: Lab Results  Component Value Date   CHOL 105 05/07/2013   HDL 35.80 (L) 05/07/2013   LDLCALC 47 05/07/2013   TRIG 110.0 05/07/2013   CHOLHDL 3 05/07/2013   INR 1.0 08/18/2017   K 4.2 08/18/2017   BUN 22 08/18/2017   CREATININE 1.18 08/18/2017    Past medical and surgical history were reviewed and updated in EPIC.  Current Meds  Medication Sig  . aspirin 81 MG tablet Take 81 mg by mouth daily.   . cetirizine (ZYRTEC) 10 MG tablet Take 10 mg by mouth as needed for allergies or rhinitis.   [name] gabapentin (NEURONTIN) 300 MG capsule as directed.  [name] lisinopril (PRINIVIL,ZESTRIL) 10 MG tablet Take 0.5 tablets (5 mg total) by mouth daily.  . metoprolol succinate (TOPROL-XL) 50 MG 24 hr tablet TAKE 1 TABLET BY MOUTH  DAILY WITH A MEAL OR  IMMEDIATELY FOLLOWING A  MEAL  . nitroGLYCERIN (NITROSTAT) 0.4 MG SL tablet Place 0.4 mg under the tongue every 5 (five) minutes as needed for chest pain.  [name] omeprazole (PRILOSEC OTC) 20 MG tablet Take 20 mg by mouth daily.    . rosuvastatin (CRESTOR) 10 MG tablet TAKE 1 TABLET BY MOUTH  DAILY.  . sitaGLIPtin-metformin (JANUMET) 50-1000 MG tablet Take 1 tablet by mouth 2 (two) times daily with a meal.  Allergies: Patient has no known allergies.  Social History   Social History  . Marital status: Married    Spouse name: N/A  . Number of children: N/A  . Years of education: N/A   Occupational History  . Not on  file.   Social History Main Topics  . Smoking status: Former Smoker    Quit date: 12/12/1982  . Smokeless tobacco: Never Used  . Alcohol use No  . Drug use: No  . Sexual activity: Not Currently   Other Topics Concern  . Not on file   Social History Narrative  . No narrative on file    Family History  Problem Relation Age of Onset  . Cancer Mother   . Heart attack Father 75    Review of Systems: A 12-system review of systems was performed and was negative except as noted in the HPI.  --------------------------------------------------------------------------------------------------  Physical Exam: BP 124/60   Pulse 68   Ht 5\' 9"  (1.753 m)   Wt 236 lb (107 kg)   BMI 34.85 kg/m   General:  Obese man, seated comfortably in the exam room. He is accompanied by his wife. HEENT: No conjunctival pallor or scleral icterus. Moist mucous membranes.  OP clear. Neck: Supple without lymphadenopathy, thyromegaly, JVD, or HJR. Lungs: Normal work of breathing. Clear to auscultation bilaterally without wheezes or crackles. Heart: Regular rate and rhythm with 3/6 late peaking crescendo-decrescendo systolic murmur loudest at the right upper sternal border and radiating to the left carotid. No rubs or gallops. Abd: Bowel sounds present. Soft, NT/ND without hepatosplenomegaly Ext: No lower extremity edema. Radial, PT, and DP pulses are 2+ bilaterally. Skin: Warm and dry without rash.  EKG:  Normal sinus rhythm with first-degree AV block and nonspecific ST/T changes.  Lab Results  Component Value Date   WBC 6.8 08/18/2017   HGB 13.1 08/18/2017   HCT 38.3 08/18/2017   MCV 94 08/18/2017   PLT 169 08/18/2017    Lab Results  Component Value Date   NA 133 (L) 08/18/2017   K 4.2 08/18/2017   CL 98 08/18/2017   CO2 19 (L) 08/18/2017   BUN 22 08/18/2017   CREATININE 1.18 08/18/2017   GLUCOSE 164 (H) 08/18/2017   ALT 26 05/28/2013    Lab Results  Component Value Date   CHOL 105  05/07/2013   HDL 35.80 (L) 05/07/2013   LDLCALC 47 05/07/2013   TRIG 110.0 05/07/2013   CHOLHDL 3 05/07/2013   Outside labs (07/03/17): Lipid panel: Direct LDL 61, total cholesterol 114, triglyceride 101, HDL 40  BMP: Sodium 137, potassium 4.2, chloride 102, CO2 23, BUN 15, creatinine 1.1, glucose 150, calcium 9.4  Hemoglobin A1c: 7.1%  --------------------------------------------------------------------------------------------------  ASSESSMENT AND PLAN: Severe aortic stenosis Echo earlier this week shows progression of aortic stenosis, which is now severe. I suspect that his progressive exertional chest pain and shortness of breath are a manifestation of aortic stenosis. We have discussed treatment options, including the limited role for medical therapy. Theodore Jenkins is concerned about the potential for open aortic valve replacement, given difficulties following right carotid endarterectomy. We have agreed to perform a left and right heart catheterization next week to assess his filling pressures and coronary anatomy. I have reviewed the risks, indications, and alternatives to cardiac catheterization, possible angioplasty, and stenting with the patient. Risks include but are not limited to bleeding, infection, vascular injury, stroke, myocardial infection, arrhythmia, kidney injury, radiation-related injury in the case of prolonged fluoroscopy use, emergency cardiac surgery,  and death. The patient understands the risks of serious complication is 1-2 in 8546 with diagnostic cardiac cath and 1-2% or less with angioplasty/stenting. I will also refer Theodore Jenkins to the TAVR clinic.  Coronary artery disease with accelerating angina As noted above, Theodore Jenkins has experienced progressive exertional chest pain and shortness of breath. Myocardial perfusion stress test earlier this year was negative. I suspect that his symptoms are predominantly due to worsening AS, though significant CAD is a consideration. We  will continue his current medications for secondary prevention and proceed with left and right heart catheterization, as detailed above.  Hypertension Blood pressure is adequately controlled today. No medication changes.  Hyperlipidemia Theodore Jenkins is tolerating rosuvastatin 10 mg daily well. LDL obtained by his PCP in July was at goal (61).  Follow-up: Return to clinic 2 weeks post catheterization.  Nelva Bush, MD 08/18/2017 5:57 PM

## 2017-08-21 ENCOUNTER — Encounter: Payer: Self-pay | Admitting: Physician Assistant

## 2017-08-21 DIAGNOSIS — E118 Type 2 diabetes mellitus with unspecified complications: Secondary | ICD-10-CM | POA: Insufficient documentation

## 2017-08-23 ENCOUNTER — Telehealth: Payer: Self-pay

## 2017-08-23 NOTE — Telephone Encounter (Signed)
Patient contacted pre-catheterization at [facility] scheduled for:  08/25/2017 @ 1200 Verified arrival time and place:  NT @ 1000 Confirmed AM meds to be taken pre-cath with sip of water: Take ASA Holding Janumet, last dose 08/22/2017 evening dose Confirmed patient has responsible person to drive home post procedure and observe patient for 24 hours:  yes Addl concerns:  none

## 2017-08-24 MED ORDER — ASPIRIN 81 MG PO CHEW: 81.0000 mg | CHEWABLE_TABLET | ORAL | Status: DC

## 2017-08-24 MED ORDER — SODIUM CHLORIDE 0.9% FLUSH: 3.0000 mL | INTRAVENOUS | Status: DC | PRN

## 2017-08-24 MED ORDER — SODIUM CHLORIDE 0.9 % WEIGHT BASED INFUSION: 3.0000 mL/kg/h | INTRAVENOUS | Status: DC

## 2017-08-24 MED ORDER — SODIUM CHLORIDE 0.9% FLUSH: 3.0000 mL | Freq: Two times a day (BID) | INTRAVENOUS | Status: DC

## 2017-08-24 MED ORDER — SODIUM CHLORIDE 0.9 % WEIGHT BASED INFUSION: 1.0000 mL/kg/h | INTRAVENOUS | Status: DC

## 2017-08-24 MED ORDER — SODIUM CHLORIDE 0.9 % IV SOLN: 250.0000 mL | INTRAVENOUS | Status: DC | PRN

## 2017-08-25 ENCOUNTER — Encounter (HOSPITAL_COMMUNITY)
Admission: RE | Disposition: A | Discharge status: Home / Self Care | Payer: Self-pay | Source: Ambulatory Visit | Attending: Internal Medicine

## 2017-08-25 ENCOUNTER — Ambulatory Visit (HOSPITAL_COMMUNITY)
Admission: RE | Admit: 2017-08-25 | Discharge: 2017-08-25 | Disposition: A | Discharge status: Home / Self Care | Payer: PPO | Source: Ambulatory Visit | Attending: Internal Medicine | Admitting: Internal Medicine

## 2017-08-25 DIAGNOSIS — Z87891 Personal history of nicotine dependence: Secondary | ICD-10-CM | POA: Insufficient documentation

## 2017-08-25 DIAGNOSIS — I6521 Occlusion and stenosis of right carotid artery: Secondary | ICD-10-CM | POA: Insufficient documentation

## 2017-08-25 DIAGNOSIS — T82855A Stenosis of coronary artery stent, initial encounter: Secondary | ICD-10-CM | POA: Diagnosis not present

## 2017-08-25 DIAGNOSIS — Y831 Surgical operation with implant of artificial internal device as the cause of abnormal reaction of the patient, or of later complication, without mention of misadventure at the time of the procedure: Secondary | ICD-10-CM | POA: Insufficient documentation

## 2017-08-25 DIAGNOSIS — I2584 Coronary atherosclerosis due to calcified coronary lesion: Secondary | ICD-10-CM | POA: Diagnosis not present

## 2017-08-25 DIAGNOSIS — Z7982 Long term (current) use of aspirin: Secondary | ICD-10-CM | POA: Diagnosis not present

## 2017-08-25 DIAGNOSIS — E785 Hyperlipidemia, unspecified: Secondary | ICD-10-CM | POA: Diagnosis not present

## 2017-08-25 DIAGNOSIS — E119 Type 2 diabetes mellitus without complications: Secondary | ICD-10-CM | POA: Insufficient documentation

## 2017-08-25 DIAGNOSIS — I2582 Chronic total occlusion of coronary artery: Secondary | ICD-10-CM | POA: Insufficient documentation

## 2017-08-25 DIAGNOSIS — I25110 Atherosclerotic heart disease of native coronary artery with unstable angina pectoris: Secondary | ICD-10-CM

## 2017-08-25 DIAGNOSIS — Z7984 Long term (current) use of oral hypoglycemic drugs: Secondary | ICD-10-CM | POA: Diagnosis not present

## 2017-08-25 DIAGNOSIS — I350 Nonrheumatic aortic (valve) stenosis: Secondary | ICD-10-CM

## 2017-08-25 DIAGNOSIS — I200 Unstable angina: Secondary | ICD-10-CM | POA: Diagnosis present

## 2017-08-25 HISTORY — PX: RIGHT/LEFT HEART CATH AND CORONARY ANGIOGRAPHY: CATH118266

## 2017-08-25 LAB — POCT I-STAT 3, ART BLOOD GAS (G3+)
Acid-base deficit: 3 mmol/L — ABNORMAL HIGH (ref 0.0–2.0)
Bicarbonate: 21.7 mmol/L (ref 20.0–28.0)
O2 Saturation: 98 %
TCO2: 23 mmol/L (ref 22–32)
pCO2 arterial: 38.4 mmHg (ref 32.0–48.0)
pH, Arterial: 7.36 (ref 7.350–7.450)
pO2, Arterial: 113 mmHg — ABNORMAL HIGH (ref 83.0–108.0)

## 2017-08-25 LAB — POCT I-STAT 3, VENOUS BLOOD GAS (G3P V)
Acid-base deficit: 3 mmol/L — ABNORMAL HIGH (ref 0.0–2.0)
Acid-base deficit: 3 mmol/L — ABNORMAL HIGH (ref 0.0–2.0)
Bicarbonate: 22.9 mmol/L (ref 20.0–28.0)
Bicarbonate: 23.1 mmol/L (ref 20.0–28.0)
O2 Saturation: 71 %
O2 Saturation: 72 %
TCO2: 24 mmol/L (ref 22–32)
TCO2: 24 mmol/L (ref 22–32)
pCO2, Ven: 43.2 mmHg — ABNORMAL LOW (ref 44.0–60.0)
pCO2, Ven: 44.9 mmHg (ref 44.0–60.0)
pH, Ven: 7.321 (ref 7.250–7.430)
pH, Ven: 7.333 (ref 7.250–7.430)
pO2, Ven: 41 mmHg (ref 32.0–45.0)
pO2, Ven: 41 mmHg (ref 32.0–45.0)

## 2017-08-25 LAB — GLUCOSE, CAPILLARY: Glucose-Capillary: 167 mg/dL — ABNORMAL HIGH (ref 65–99)

## 2017-08-25 SURGERY — RIGHT/LEFT HEART CATH AND CORONARY ANGIOGRAPHY
Anesthesia: LOCAL

## 2017-08-25 MED ORDER — IOPAMIDOL (ISOVUE-370) INJECTION 76%
INTRAVENOUS | Status: DC | PRN
  Administered 2017-08-25: 100 mL via INTRA_ARTERIAL

## 2017-08-25 MED ORDER — VERAPAMIL HCL 2.5 MG/ML IV SOLN
INTRAVENOUS | Status: DC | PRN
  Administered 2017-08-25: 10 mL via INTRA_ARTERIAL

## 2017-08-25 MED ORDER — SODIUM CHLORIDE 0.9% FLUSH: 3.0000 mL | Freq: Two times a day (BID) | INTRAVENOUS | Status: DC

## 2017-08-25 MED ORDER — SODIUM CHLORIDE 0.9 % IV SOLN: 250.0000 mL | INTRAVENOUS | Status: DC | PRN

## 2017-08-25 MED ORDER — SODIUM CHLORIDE 0.9 % WEIGHT BASED INFUSION
3.0000 mL/kg/h | INTRAVENOUS | Status: AC
  Administered 2017-08-25: 250 mL via INTRAVENOUS
  Administered 2017-08-25: 3 mL/kg/h via INTRAVENOUS

## 2017-08-25 MED ORDER — MIDAZOLAM HCL 2 MG/2ML IJ SOLN
INTRAMUSCULAR | Status: AC
  Filled 2017-08-25: qty 2

## 2017-08-25 MED ORDER — HEPARIN (PORCINE) IN NACL 2-0.9 UNIT/ML-% IJ SOLN
INTRAMUSCULAR | Status: AC | PRN
  Administered 2017-08-25: 1000 mL

## 2017-08-25 MED ORDER — FENTANYL CITRATE (PF) 100 MCG/2ML IJ SOLN
INTRAMUSCULAR | Status: AC
  Filled 2017-08-25: qty 2

## 2017-08-25 MED ORDER — SODIUM CHLORIDE 0.9% FLUSH: 3.0000 mL | INTRAVENOUS | Status: DC | PRN

## 2017-08-25 MED ORDER — LIDOCAINE HCL (PF) 1 % IJ SOLN
INTRAMUSCULAR | Status: AC
  Filled 2017-08-25: qty 30

## 2017-08-25 MED ORDER — HEPARIN SODIUM (PORCINE) 1000 UNIT/ML IJ SOLN
INTRAMUSCULAR | Status: DC | PRN
  Administered 2017-08-25: 5000 [IU] via INTRAVENOUS

## 2017-08-25 MED ORDER — VERAPAMIL HCL 2.5 MG/ML IV SOLN
INTRAVENOUS | Status: AC
  Filled 2017-08-25: qty 2

## 2017-08-25 MED ORDER — ASPIRIN 81 MG PO CHEW: 81.0000 mg | CHEWABLE_TABLET | ORAL | Status: DC

## 2017-08-25 MED ORDER — LIDOCAINE HCL (PF) 1 % IJ SOLN
INTRAMUSCULAR | Status: DC | PRN
  Administered 2017-08-25: 5 mL

## 2017-08-25 MED ORDER — HEPARIN SODIUM (PORCINE) 1000 UNIT/ML IJ SOLN
INTRAMUSCULAR | Status: AC
  Filled 2017-08-25: qty 1

## 2017-08-25 MED ORDER — SODIUM CHLORIDE 0.9 % WEIGHT BASED INFUSION: 1.0000 mL/kg/h | INTRAVENOUS | Status: DC

## 2017-08-25 MED ORDER — HEPARIN (PORCINE) IN NACL 2-0.9 UNIT/ML-% IJ SOLN
INTRAMUSCULAR | Status: AC
  Filled 2017-08-25: qty 1000

## 2017-08-25 MED ORDER — SODIUM CHLORIDE 0.9 % IV SOLN: INTRAVENOUS | Status: DC

## 2017-08-25 SURGICAL SUPPLY — 12 items
CATH BALLN WEDGE 5F 110CM (CATHETERS) ×1 IMPLANT
CATH EXPO 5F FL3.5 (CATHETERS) ×1 IMPLANT
CATH OPTITORQUE TIG 4.0 5F (CATHETERS) ×1 IMPLANT
DEVICE RAD COMP TR BAND LRG (VASCULAR PRODUCTS) ×1 IMPLANT
GLIDESHEATH SLEND SS 6F .021 (SHEATH) ×1 IMPLANT
GUIDEWIRE INQWIRE 1.5J.035X260 (WIRE) IMPLANT
INQWIRE 1.5J .035X260CM (WIRE) ×2
KIT HEART LEFT (KITS) ×2 IMPLANT
PACK CARDIAC CATHETERIZATION (CUSTOM PROCEDURE TRAY) ×2 IMPLANT
SHEATH GLIDE SLENDER 4/5FR (SHEATH) ×1 IMPLANT
TRANSDUCER W/STOPCOCK (MISCELLANEOUS) ×2 IMPLANT
TUBING CIL FLEX 10 FLL-RA (TUBING) ×2 IMPLANT

## 2017-08-25 NOTE — Brief Op Note (Signed)
Brief Cardiac Catheterization Note  Date: 08/25/2017 Time: 8:27 AM  PATIENT:  Theodore Jenkins  71 y.o. male  PRE-OPERATIVE DIAGNOSIS:  Accelerating angina with severe aortic stenosis  POST-OPERATIVE DIAGNOSIS:  Same  PROCEDURE:  Procedure(s): RIGHT/LEFT HEART CATH AND CORONARY ANGIOGRAPHY (N/A)  SURGEON:  Surgeon(s) and Role:    * Emani Taussig, Harrell Gave, MD - Primary  FINDINGS: 1. Three-vessel coronary artery disease including diffuse mild to moderate proximal to mid LAD disease, 50-60% in-stent restenosis of the distal LAD stent, moderate-caliber OM1 branch with 80% proximal disease, and dominant RCA with chronic total occlusion of very small posterior descending artery. 2. Severe aortic stenosis with mean transvalvular gradient by pullback of 66 mmHg and peak to peak gradient of 71 mmHg. 3. Normal left and right heart filling pressures. 4. Normal Fick cardiac output.  RECOMMENDATIONS: 1. Proceed with TAVR/surgical evaluation for aortic valve replacement. 2. Medical therapy of coronary artery disease pending further evaluation for best valve replacement strategy. 3. Aggressive secondary prevention including low-dose aspirin and statin therapy.  Nelva Bush, MD [facility] Pager: [phone]

## 2017-08-25 NOTE — Discharge Instructions (Signed)

## 2017-08-25 NOTE — Interval H&P Note (Signed)
History and Physical Interval Note:  08/25/2017 7:00 AM  Theodore Jenkins  has presented today for cardiac catheterization, with the diagnosis of accelerating angina and severe aortic stenosis. The various methods of treatment have been discussed with the patient and family. After consideration of risks, benefits and other options for treatment, the patient has consented to  Procedure(s): RIGHT/LEFT HEART CATH AND CORONARY ANGIOGRAPHY (N/A) as a surgical intervention .  The patient's history has been reviewed, patient examined, no change in status, stable for surgery.  I have reviewed the patient's chart and labs.  Questions were answered to the patient's satisfaction.    Cath Lab Visit (complete for each Cath Lab visit)  Clinical Evaluation Leading to the Procedure:   ACS: No.  Non-ACS:    Anginal Classification: CCS III  Anti-ischemic medical therapy: Minimal Therapy (1 class of medications)  Non-Invasive Test Results: No non-invasive testing performed  Prior CABG: No previous CABG  Theodore Jenkins

## 2017-08-25 NOTE — H&P (View-Only) (Signed)
Follow-up Outpatient Visit Date: 08/18/2017  Primary Care Provider: Karlene Einstein, [place] [address]  Chief Complaint: Chest pain  HPI:  Mr. Bouwman is a 71 y.o. year-old male with history of coronary artery disease status post PCI to the LAD 2005, aortic stenosis, cerebrovascular disease status post right carotid endarterectomy with subsequent right internal carotid artery occlusion, and diabetes mellitus who presents for follow-up of CAD and aortic stenosis. He was last seen in February by Richardson Dopp, PA, having previously been followed by Dr. Aundra Dubin. Over the last few months, Mr. Muilenburg has experienced increasing chest pain with walking that begins after going only 50-60 feet. This is most pronounced last week when at the beach. He notes accompanying shortness of breath. The symptoms typically resolve after resting for about 10 minutes. He has not taken sublingual nitroglycerin. He denies lightheadedness and syncope but reports that he. He has not had any orthopnea, PND, or edema. He wears CPAP every night.  Of note, Mr. Bumgarner denies any chest pain at the time of his PCI in 2005. He reports that catheterization was performed because he "flunked a stress test."  --------------------------------------------------------------------------------------------------  Cardiovascular History & Procedures: Cardiovascular Problems:  Coronary artery disease  Aortic stenosis  Cerebrovascular disease  Risk Factors:  Known coronary artery disease, cerebrovascular disease, hypertension, hyperlipidemia, diabetes mellitus.  Cath/PCI:  LHC/PCI (11/12/04): LMCA normal. LAD with diffuse luminal irregularities with 60-70% mid and 90-95% distal disease. LCx with 2 large OM branches and 60-70% stenosis involving OM1. Large, dominant RCA with diffuse luminal irregularities. RPDA with severe diffuse disease. Successful PCI to mid and distal LAD with placement of overlapping Cypher  stents.  CV Surgery:  Right carotid endarterectomy (10/29/02)  EP Procedures and Devices:  None  Non-Invasive Evaluation(s):  TTE (08/15/17): Normal LV size with mild LVH. LVEF 55-60% with grade 1 diastolic dysfunction. Severe aortic stenosis (mean gradient 42 mmHg) with trace aortic regurgitation. Mitral annular calcification. Mild aortic root enlargement. Mild left atrial enlargement. Normal RV size and function.  Pharmacologic MPI (02/14/17): Low risk study without evidence of ischemia or scar. LVEF 55%.  Recent CV Pertinent Labs: Lab Results  Component Value Date   CHOL 105 05/07/2013   HDL 35.80 (L) 05/07/2013   LDLCALC 47 05/07/2013   TRIG 110.0 05/07/2013   CHOLHDL 3 05/07/2013   INR 1.0 08/18/2017   K 4.2 08/18/2017   BUN 22 08/18/2017   CREATININE 1.18 08/18/2017    Past medical and surgical history were reviewed and updated in EPIC.  Current Meds  Medication Sig  . aspirin 81 MG tablet Take 81 mg by mouth daily.   . cetirizine (ZYRTEC) 10 MG tablet Take 10 mg by mouth as needed for allergies or rhinitis.   [name] gabapentin (NEURONTIN) 300 MG capsule as directed.  [name] lisinopril (PRINIVIL,ZESTRIL) 10 MG tablet Take 0.5 tablets (5 mg total) by mouth daily.  . metoprolol succinate (TOPROL-XL) 50 MG 24 hr tablet TAKE 1 TABLET BY MOUTH  DAILY WITH A MEAL OR  IMMEDIATELY FOLLOWING A  MEAL  . nitroGLYCERIN (NITROSTAT) 0.4 MG SL tablet Place 0.4 mg under the tongue every 5 (five) minutes as needed for chest pain.  [name] omeprazole (PRILOSEC OTC) 20 MG tablet Take 20 mg by mouth daily.    . rosuvastatin (CRESTOR) 10 MG tablet TAKE 1 TABLET BY MOUTH  DAILY.  . sitaGLIPtin-metformin (JANUMET) 50-1000 MG tablet Take 1 tablet by mouth 2 (two) times daily with a meal.  Allergies: Patient has no known allergies.  Social History   Social History  . Marital status: Married    Spouse name: N/A  . Number of children: N/A  . Years of education: N/A   Occupational History  . Not on  file.   Social History Main Topics  . Smoking status: Former Smoker    Quit date: 12/12/1982  . Smokeless tobacco: Never Used  . Alcohol use No  . Drug use: No  . Sexual activity: Not Currently   Other Topics Concern  . Not on file   Social History Narrative  . No narrative on file    Family History  Problem Relation Age of Onset  . Cancer Mother   . Heart attack Father 76    Review of Systems: A 12-system review of systems was performed and was negative except as noted in the HPI.  --------------------------------------------------------------------------------------------------  Physical Exam: BP 124/60   Pulse 68   Ht 5\' 9"  (1.753 m)   Wt 236 lb (107 kg)   BMI 34.85 kg/m   General:  Obese man, seated comfortably in the exam room. He is accompanied by his wife. HEENT: No conjunctival pallor or scleral icterus. Moist mucous membranes.  OP clear. Neck: Supple without lymphadenopathy, thyromegaly, JVD, or HJR. Lungs: Normal work of breathing. Clear to auscultation bilaterally without wheezes or crackles. Heart: Regular rate and rhythm with 3/6 late peaking crescendo-decrescendo systolic murmur loudest at the right upper sternal border and radiating to the left carotid. No rubs or gallops. Abd: Bowel sounds present. Soft, NT/ND without hepatosplenomegaly Ext: No lower extremity edema. Radial, PT, and DP pulses are 2+ bilaterally. Skin: Warm and dry without rash.  EKG:  Normal sinus rhythm with first-degree AV block and nonspecific ST/T changes.  Lab Results  Component Value Date   WBC 6.8 08/18/2017   HGB 13.1 08/18/2017   HCT 38.3 08/18/2017   MCV 94 08/18/2017   PLT 169 08/18/2017    Lab Results  Component Value Date   NA 133 (L) 08/18/2017   K 4.2 08/18/2017   CL 98 08/18/2017   CO2 19 (L) 08/18/2017   BUN 22 08/18/2017   CREATININE 1.18 08/18/2017   GLUCOSE 164 (H) 08/18/2017   ALT 26 05/28/2013    Lab Results  Component Value Date   CHOL 105  05/07/2013   HDL 35.80 (L) 05/07/2013   LDLCALC 47 05/07/2013   TRIG 110.0 05/07/2013   CHOLHDL 3 05/07/2013   Outside labs (07/03/17): Lipid panel: Direct LDL 61, total cholesterol 114, triglyceride 101, HDL 40  BMP: Sodium 137, potassium 4.2, chloride 102, CO2 23, BUN 15, creatinine 1.1, glucose 150, calcium 9.4  Hemoglobin A1c: 7.1%  --------------------------------------------------------------------------------------------------  ASSESSMENT AND PLAN: Severe aortic stenosis Echo earlier this week shows progression of aortic stenosis, which is now severe. I suspect that his progressive exertional chest pain and shortness of breath are a manifestation of aortic stenosis. We have discussed treatment options, including the limited role for medical therapy. Mr. Lizotte is concerned about the potential for open aortic valve replacement, given difficulties following right carotid endarterectomy. We have agreed to perform a left and right heart catheterization next week to assess his filling pressures and coronary anatomy. I have reviewed the risks, indications, and alternatives to cardiac catheterization, possible angioplasty, and stenting with the patient. Risks include but are not limited to bleeding, infection, vascular injury, stroke, myocardial infection, arrhythmia, kidney injury, radiation-related injury in the case of prolonged fluoroscopy use, emergency cardiac surgery,  and death. The patient understands the risks of serious complication is 1-2 in 8270 with diagnostic cardiac cath and 1-2% or less with angioplasty/stenting. I will also refer Mr. Arana to the TAVR clinic.  Coronary artery disease with accelerating angina As noted above, Mr. Frankson has experienced progressive exertional chest pain and shortness of breath. Myocardial perfusion stress test earlier this year was negative. I suspect that his symptoms are predominantly due to worsening AS, though significant CAD is a consideration. We  will continue his current medications for secondary prevention and proceed with left and right heart catheterization, as detailed above.  Hypertension Blood pressure is adequately controlled today. No medication changes.  Hyperlipidemia Mr. Roni Bread is tolerating rosuvastatin 10 mg daily well. LDL obtained by his PCP in July was at goal (61).  Follow-up: Return to clinic 2 weeks post catheterization.  Nelva Bush, MD 08/18/2017 5:57 PM

## 2017-08-28 ENCOUNTER — Encounter (HOSPITAL_COMMUNITY): Payer: Self-pay | Admitting: Internal Medicine

## 2017-09-08 ENCOUNTER — Encounter: Payer: Self-pay | Admitting: Cardiovascular Disease

## 2017-09-08 ENCOUNTER — Ambulatory Visit (INDEPENDENT_AMBULATORY_CARE_PROVIDER_SITE_OTHER): Payer: PPO | Admitting: Cardiovascular Disease

## 2017-09-08 VITALS — BP 116/70 | HR 66 | Ht 69.0 in | Wt 235.4 lb

## 2017-09-08 DIAGNOSIS — I25118 Atherosclerotic heart disease of native coronary artery with other forms of angina pectoris: Secondary | ICD-10-CM

## 2017-09-08 DIAGNOSIS — I350 Nonrheumatic aortic (valve) stenosis: Secondary | ICD-10-CM

## 2017-09-08 DIAGNOSIS — G4733 Obstructive sleep apnea (adult) (pediatric): Secondary | ICD-10-CM | POA: Diagnosis not present

## 2017-09-08 NOTE — Progress Notes (Signed)
TAVR Consult Note  Chief Complaint  Patient presents with  . New Patient (Initial Visit)    severe aortic valve stenosis   Referring Provider: Dr. Saunders Revel  History of Present Illness: 71 yo with history of morbid obesity, CAD s/p prior PCI, DM, HTN, HLD, carotid artery disease s/p prior CEA, Barrett's esophagitis, AAA, lower extremity PAD and severe aortic stenosis who is here today in the valve clinic to discuss his aortic stenosis. He was seen recently in our office by Dr. Saunders Revel and c/o chest pain and dyspnea with exertion. He had stenting of the LAD in 2005 with overlapping Cypher drug eluting stents. Moderately severe disease in the Circumflex artery at that time. Nuclear stress test in March 2018 without ischemia. He had right CEA in 2003 by Dr. Amedeo Plenty but he was not contacted for f/u by the [facility] office and he switched to see Vascular surgery in [facility]. Most recent echo 08/15/17 with LVEF=55-60%, severe aortic stenosis with mean gradient 42 mmHg, peak gradient 73 mmHg, AVA 0.58 cm2. Cardiac cath 08/25/17 with mild to moderate LAD stenosis, moderate restenosis LAD stents, 80% stenosis OM2, dominant RCA with chronic total occlusion of the very small PDA. Transvalvular aortic valve gradient during cath with mean of 66 mmHg, peak to peak gradient 71 mmHg. He describes dyspnea with minimal exertion, dizziness and chest pressure progressive over the last two months. No syncope. He has no LE edema or palpitations. He is retired from [employer]. He lives with his wife in [place].   Primary Care Physician: Karlene Einstein, MD  Primary Cardiologist: Dr. Saunders Revel  Past Medical History:  Diagnosis Date  . Aortic stenosis    Echo (08/2017): Severe AS with mean gradient of 42 mmHg.  . Barrett's esophagus   . CAD (coronary artery disease) 06/06/2011   Remote stent to the LAD in 2005 // Last cath in 2010 showing diffuse CAD, small PD that is occluded and fills with left to right collaterals, 60-70% 1st  OM, with normal EF. Managed medically // Myoview 3/18:  Normal perfusion. LVEF 55% with normal wall motion. This is a low risk study.  . Diabetes mellitus   . ED (erectile dysfunction)   . GERD (gastroesophageal reflux disease)   . Hyperlipidemia   . Hypertension   . IHD (ischemic heart disease)    prior stenting of the LAD in December of 2005; with last cath in 2010 showing diffuse CAD with normal LV function. He is managed medically.   . Obesity   . Obstructive sleep apnea on CPAP   . S/P coronary artery stent placement 2005   LAD    Past Surgical History:  Procedure Laterality Date  . CARDIAC CATHETERIZATION  2010   NORMAL LV FUNCTION. DIFFUSE CORONARY DISEASE WITH THE DISTAL PORTION OF THE SMALL POSTERIOR DESCENDING VESSEL OCCLUDED AND FILLING WITH THE RIGHT COLLATERALS, 60-70% STENOSIS IN THE FIRST OBTUSE MARGINAL VESSEL AND DIFFUSE SMALL VESSEL DISEASE  . CAROTID ENDARTERECTOMY    . CIRCUMCISION    . CORONARY STENT PLACEMENT  2005  . RIGHT/LEFT HEART CATH AND CORONARY ANGIOGRAPHY N/A 08/25/2017   Procedure: RIGHT/LEFT HEART CATH AND CORONARY ANGIOGRAPHY;  Surgeon: Nelva Bush, MD;  Location: [facility];  Service: Cardiovascular;  Laterality: N/A;  . rotator cuff surgery Right     Current Outpatient Prescriptions  Medication Sig Dispense Refill  . acetaminophen (TYLENOL) 500 MG tablet Take 1,000 mg by mouth every 8 (eight) hours as needed for headache.    [name]  aspirin 81 MG tablet Take 81 mg by mouth daily.     . cetirizine (ZYRTEC) 10 MG tablet Take 10 mg by mouth daily.     . fluticasone (FLONASE) 50 MCG/ACT nasal spray Place 1 spray into both nostrils daily as needed for allergies or rhinitis.    [name] gabapentin (NEURONTIN) 300 MG capsule Take 600 mg by mouth 2 (two) times daily.     [name] lisinopril (PRINIVIL,ZESTRIL) 10 MG tablet Take 0.5 tablets (5 mg total) by mouth daily. 90 tablet 3  . metoprolol succinate (TOPROL-XL) 50 MG 24 hr tablet TAKE 1 TABLET BY MOUTH  DAILY  WITH A MEAL OR  IMMEDIATELY FOLLOWING A  MEAL 90 tablet 3  . Multiple Vitamins-Minerals (ONE-A-DAY 50 PLUS PO) Take 1 tablet by mouth daily.    . nitroGLYCERIN (NITROSTAT) 0.4 MG SL tablet Place 0.4 mg under the tongue every 5 (five) minutes as needed for chest pain.    [name] omeprazole (PRILOSEC OTC) 20 MG tablet Take 20 mg by mouth daily.      . rosuvastatin (CRESTOR) 10 MG tablet TAKE 1 TABLET BY MOUTH  DAILY. 90 tablet 1  . sitaGLIPtin-metformin (JANUMET) 50-1000 MG tablet Take 1 tablet by mouth 2 (two) times daily with a meal.      No current facility-administered medications for this visit.     No Known Allergies  Social History   Social History  . Marital status: Married    Spouse name: N/A  . Number of children: 3  . Years of education: N/A   Occupational History  . Retired-[employer]    Social History Main Topics  . Smoking status: Former Smoker    Packs/day: 1.00    Years: 20.00    Types: Cigarettes    Quit date: 12/12/1992  . Smokeless tobacco: Never Used  . Alcohol use 0.6 oz/week    1 Cans of beer per week  . Drug use: No  . Sexual activity: Not Currently   Other Topics Concern  . Not on file   Social History Narrative  . No narrative on file    Family History  Problem Relation Age of Onset  . Cancer Mother   . Heart attack Father 30  . CAD Brother     Review of Systems:  As stated in the HPI and otherwise negative.   BP 116/70   Pulse 66   Ht 5\' 9"  (1.753 m)   Wt 235 lb 6.4 oz (106.8 kg)   SpO2 96%   BMI 34.76 kg/m   Physical Examination: General: Well developed, well nourished, NAD  HEENT: OP clear, mucus membranes moist  SKIN: warm, dry. No rashes. Neuro: No focal deficits  Musculoskeletal: Muscle strength 5/5 all ext  Psychiatric: Mood and affect normal  Neck: No JVD, no carotid bruits, no thyromegaly, no lymphadenopathy.  Lungs:Clear bilaterally, no wheezes, rhonci, crackles Cardiovascular: Regular rate and rhythm. Late  peaking, harsh systolic murmur over the RUSB. No gallops or rubs. Abdomen:Soft. Bowel sounds present. Non-tender.  Extremities: No lower extremity edema. Pulses are 2 + in the bilateral DP/PT.  Echo 08/15/17: Left ventricle: The cavity size was normal. Wall thickness was   increased in a pattern of mild LVH. Systolic function was normal.   The estimated ejection fraction was in the range of 55% to 60%.   Wall motion was normal; there were no regional wall motion   abnormalities. Doppler parameters are consistent with abnormal   left ventricular relaxation (grade 1  diastolic dysfunction). - Aortic valve: Valve mobility was restricted. There was severe   stenosis. There was trivial regurgitation. - Aortic root: The aortic root was mildly dilated. - Mitral valve: Calcified annulus. - Left atrium: The atrium was mildly dilated.  Impressions:  - Normal LV systolic function; mild diastolic dysfunction; mild   LVH; severely calcified aortic valve with severe AS (mean   gradient 42 mmHg) and trace AI; mildly dilated aortic root; mild   LAE.  ------------------------------------------------------------------- Study data:  Comparison was made to the study of 08/22/2016.  Study status:  Routine.  Procedure:  The patient reported no pain pre or post test. Transthoracic echocardiography for left ventricular function evaluation and for assessment of valvular function. Image quality was adequate.  Study completion:  There were no complications.          Transthoracic echocardiography.  M-mode, complete 2D, spectral Doppler, and color Doppler.  Birthdate: Patient birthdate: 12/26/1945.  Age:  Patient is 71 yr old.  Sex: Gender: male.    BMI: 34.4 kg/m^2.  Blood pressure:     118/54 Patient status:  Outpatient.  Study date:  Study date: 08/15/2017. Study time: 07:47 AM.  Location:  [facility]  3  -------------------------------------------------------------------  ------------------------------------------------------------------- Left ventricle:  The cavity size was normal. Wall thickness was increased in a pattern of mild LVH. Systolic function was normal. The estimated ejection fraction was in the range of 55% to 60%. Wall motion was normal; there were no regional wall motion abnormalities. Doppler parameters are consistent with abnormal left ventricular relaxation (grade 1 diastolic dysfunction).  ------------------------------------------------------------------- Aortic valve:   Trileaflet; severely calcified leaflets. Valve mobility was restricted.  Doppler:   There was severe stenosis. There was trivial regurgitation.    VTI ratio of LVOT to aortic valve: 0.16. Valve area (VTI): 0.57 cm^2. Indexed valve area (VTI): 0.25 cm^2/m^2. Peak velocity ratio of LVOT to aortic valve: 0.17. Valve area (Vmax): 0.58 cm^2. Indexed valve area (Vmax): 0.25 cm^2/m^2. Mean velocity ratio of LVOT to aortic valve: 0.17. Valve area (Vmean): 0.58 cm^2. Indexed valve area (Vmean): 0.25 cm^2/m^2.    Mean gradient (S): 42 mm Hg. Peak gradient (S): 73 mm Hg.  ------------------------------------------------------------------- Aorta:  Aortic root: The aortic root was mildly dilated.  ------------------------------------------------------------------- Mitral valve:   Calcified annulus. Mobility was not restricted. Doppler:  Transvalvular velocity was within the normal range. There was no evidence for stenosis. There was trivial regurgitation.  ------------------------------------------------------------------- Left atrium:  The atrium was mildly dilated.  ------------------------------------------------------------------- Right ventricle:  The cavity size was normal. Systolic function was normal.  ------------------------------------------------------------------- Pulmonic valve:     Doppler:  Transvalvular velocity was within the normal range. There was no evidence for stenosis. There was trivial regurgitation.  ------------------------------------------------------------------- Tricuspid valve:   Structurally normal valve.    Doppler: Transvalvular velocity was within the normal range. There was trivial regurgitation.  ------------------------------------------------------------------- Pulmonary artery:   Systolic pressure was within the normal range.   ------------------------------------------------------------------- Right atrium:  The atrium was normal in size.  ------------------------------------------------------------------- Pericardium:  There was no pericardial effusion.  ------------------------------------------------------------------- Systemic veins: Inferior vena cava: The vessel was normal in size.  ------------------------------------------------------------------- Measurements   Left ventricle                            Value          Reference  LV ID, ED, PLAX chordal           (  L)     41.8  mm       43 - 52  LV ID, ES, PLAX chordal                   29.9  mm       23 - 38  LV fx shortening, PLAX chordal    (L)     28    %        >=29  LV PW thickness, ED                       13.43 mm       ---------  IVS/LV PW ratio, ED                       0.94           <=1.3  Stroke volume, 2D                         61    ml       ---------  Stroke volume/bsa, 2D                     26    ml/m^2   ---------  LV ejection fraction, 1-p A4C             60    %        ---------  LV end-diastolic volume, 2-p              145   ml       ---------  LV end-systolic volume, 2-p               61    ml       ---------  LV ejection fraction, 2-p                 58    %        ---------  Stroke volume, 2-p                        84    ml       ---------  LV end-diastolic volume/bsa, 2-p          63    ml/m^2   ---------  LV end-systolic volume/bsa,  2-p           26    ml/m^2   ---------  Stroke volume/bsa, 2-p                    36.4  ml/m^2   ---------  LV e&', lateral                            6.53  cm/s     ---------  LV E/e&', lateral                          10.35          ---------  LV e&', medial                             4.97  cm/s     ---------  LV E/e&', medial  13.6           ---------  LV e&', average                            5.75  cm/s     ---------  LV E/e&', average                          11.76          ---------    Ventricular septum                        Value          Reference  IVS thickness, ED                         12.63 mm       ---------    LVOT                                      Value          Reference  LVOT ID, S                                21    mm       ---------  LVOT area                                 3.46  cm^2     ---------  LVOT ID                                   21    mm       ---------  LVOT peak velocity, S                     71.4  cm/s     ---------  LVOT mean velocity, S                     51.3  cm/s     ---------  LVOT VTI, S                               17.5  cm       ---------  LVOT peak gradient, S                     2     mm Hg    ---------  Stroke volume (SV), LVOT DP               60.6  ml       ---------  Stroke index (SV/bsa), LVOT DP            26.3  ml/m^2   ---------    Aortic valve                              Value  Reference  Aortic valve peak velocity, S             426   cm/s     ---------  Aortic valve mean velocity, S             306   cm/s     ---------  Aortic valve VTI, S                       107   cm       ---------  Aortic mean gradient, S                   42    mm Hg    ---------  Aortic peak gradient, S                   73    mm Hg    ---------  VTI ratio, LVOT/AV                        0.16           ---------  Aortic valve area, VTI                    0.57  cm^2     ---------  Aortic valve area/bsa, VTI                 0.25  cm^2/m^2 ---------  Velocity ratio, peak, LVOT/AV             0.17           ---------  Aortic valve area, peak velocity          0.58  cm^2     ---------  Aortic valve area/bsa, peak               0.25  cm^2/m^2 ---------  velocity  Velocity ratio, mean, LVOT/AV             0.17           ---------  Aortic valve area, mean velocity          0.58  cm^2     ---------  Aortic valve area/bsa, mean               0.25  cm^2/m^2 ---------  velocity  Aortic regurg pressure half-time          512   ms       ---------    Aorta                                     Value          Reference  Aortic root ID, ED                        38    mm       ---------  Ascending aorta ID, A-P, S                35    mm       ---------    Left atrium                               Value  Reference  LA ID, A-P, ES                            45    mm       ---------  LA ID/bsa, A-P                            1.95  cm/m^2   <=2.2  LA volume, S                              79    ml       ---------  LA volume/bsa, S                          34.3  ml/m^2   ---------  LA volume, ES, 1-p A4C                    81    ml       ---------  LA volume/bsa, ES, 1-p A4C                35.1  ml/m^2   ---------  LA volume, ES, 1-p A2C                    76    ml       ---------  LA volume/bsa, ES, 1-p A2C                33    ml/m^2   ---------    Mitral valve                              Value          Reference  Mitral E-wave peak velocity               67.6  cm/s     ---------  Mitral A-wave peak velocity               70.6  cm/s     ---------  Mitral deceleration time          (H)     243   ms       150 - 230  Mitral E/A ratio, peak                    1              ---------    Pulmonary arteries                        Value          Reference  PA pressure, S, DP                        28    mm Hg    <=30    Tricuspid valve                           Value          Reference  Tricuspid regurg  peak velocity            249  cm/s     ---------  Tricuspid peak RV-RA gradient             25    mm Hg    ---------    Systemic veins                            Value          Reference  Estimated CVP                             3     mm Hg    ---------    Right ventricle                           Value          Reference  RV pressure, S, DP                        28    mm Hg    <=30  RV s&', lateral, S                         11.5  cm/s     ---------  Cardiac cath 08/25/17: Findings: 1. Multivessel coronary artery disease including diffuse mild to moderate proximal to mid LAD disease, 50-60% in-stent restenosis of the distal LAD stent, moderate-caliber OM2 branch with 80% proximal disease, and dominant RCA with chronic total occlusion of very small posterior descending artery. Disease predominantly affects small/distal vessels. 2. Severe aortic stenosis with mean transvalvular gradient by pullback of 66 mmHg and peak-to-peak gradient of 71 mmHg. 3. Normal left and right heart filling pressures. 4. Normal Fick cardiac output.  Dominance: Right  Left Main  Vessel is large. Vessel is angiographically normal. Short LMCA.  Left Anterior Descending  Vessel is large.  Ost LAD to Prox LAD lesion, 40% stenosed. The lesion is eccentric. The lesion is calcified.  Mid LAD lesion, 50% stenosed.  Mid LAD to Dist LAD lesion, 60% stenosed. The lesion was previously treated using a drug eluting stent over 2 years ago. Previously placed stent displays restenosis. Focal, eccentric 60% in-stent restenosis in distal stented segment.  First Diagonal Branch  Vessel is small in size.  Second Diagonal Branch  Vessel is small in size. There is mild disease in the vessel.  Third Diagonal Branch  Vessel is small in size.  Left Circumflex  Vessel is moderate in size.  Mid Cx-1 lesion, 30% stenosed. The lesion is eccentric.  Mid Cx-2 lesion, 40% stenosed.  First Obtuse Marginal Branch  Vessel is small in  size.  Second Obtuse Marginal Branch  Vessel is moderate in size.  2nd Mrg lesion, 85% stenosed.  Third Obtuse Marginal Branch  Vessel is moderate in size.  Right Coronary Artery  Vessel is large.  Prox RCA lesion, 25% stenosed. The lesion is mildly calcified.  Mid RCA to Dist RCA lesion, 25% stenosed. The lesion is irregular.  Right Posterior Descending Artery  Vessel is small in size. There is severe disease in the vessel. RPDA filled by collaterals from 3rd Sept.  RPDA lesion, 100% stenosed.  Right Posterior Atrioventricular Branch  Vessel is moderate in size.  Right Heart   Right Heart Pressures RA (mean): 3 mmHg RV (S/EDP): 28/5 mmHg PA (S/D, mean): 28/7 (14)  mmHg PCWP (mean): 9 mmHg  Ao sat: 98% PA sat: 72% RA sat: 71%  Fick CO: 6.1 L/min Fick CI: 2.8 L/min/m^2    Left Heart   Left Ventricle LV end diastolic pressure is normal.    Aortic Valve There is severe aortic valve stenosis. Mean gradient: 66 mmHg Peak-to-peak gradient: 71 mmHg Valve area: 0.8 cm^2    Coronary Diagrams   Diagnostic Diagram       Implants     No implant documentation for this case.  MERGE Images   Show images for CARDIAC CATHETERIZATION   Link to Procedure Log   Procedure Log    Hemo Data    Most Recent Value  Fick Cardiac Output 6.14 L/min  Fick Cardiac Output Index 2.77 (L/min)/BSA  Aortic Mean Gradient 66.1 mmHg  Aortic Peak Gradient 71 mmHg  Aortic Valve Area 0.78  Aortic Value Area Index 0.35 cm2/BSA  RA A Wave 6 mmHg  RA V Wave 3 mmHg  RA Mean 2 mmHg  RV Systolic Pressure 28 mmHg  RV Diastolic Pressure 0 mmHg  RV EDP 5 mmHg  PA Systolic Pressure 23 mmHg  PA Diastolic Pressure 7 mmHg  PA Mean 13 mmHg  PW A Wave 8 mmHg  PW V Wave 9 mmHg  PW Mean 7 mmHg  AO Systolic Pressure 427 mmHg  AO Diastolic Pressure 53 mmHg  AO Mean 74 mmHg  LV Systolic Pressure 062 mmHg  LV Diastolic Pressure 1 mmHg  LV EDP 13 mmHg  Arterial Occlusion Pressure Extended Systolic  Pressure 99 mmHg  Arterial Occlusion Pressure Extended Diastolic Pressure 53 mmHg  Arterial Occlusion Pressure Extended Mean Pressure 67 mmHg  Left Ventricular Apex Extended Systolic Pressure 376 mmHg  Left Ventricular Apex Extended Diastolic Pressure 0 mmHg  Left Ventricular Apex Extended EDP Pressure 18 mmHg  QP/QS 1.04  TPVR Index 4.53 HRUI  TSVR Index 26.76 HRUI  PVR SVR Ratio 0.08  TPVR/TSVR Ratio 0.17     EKG:  EKG is not ordered today. The ekg ordered today demonstrates   Recent Labs: 08/18/2017: BUN 22; Creatinine, Ser 1.18; Hemoglobin 13.1; Platelets 169; Potassium 4.2; Sodium 133   Lipid Panel    Component Value Date/Time   CHOL 105 05/07/2013 0854   TRIG 110.0 05/07/2013 0854   HDL 35.80 (L) 05/07/2013 0854   CHOLHDL 3 05/07/2013 0854   VLDL 22.0 05/07/2013 0854   LDLCALC 47 05/07/2013 0854     Wt Readings from Last 3 Encounters:  09/08/17 235 lb 6.4 oz (106.8 kg)  08/25/17 235 lb (106.6 kg)  08/18/17 236 lb (107 kg)     Other studies Reviewed: Additional studies/ records that were reviewed today include: . Review of the above records demonstrates:   STS Risk Score:  Risk of Mortality: 2.774%  Renal Failure: 3.870%  Permanent Stroke: 2.666%  Prolonged Ventilation: 11.128%  DSW Infection: 0.286%  Reoperation: 4.119%  Morbidity or Mortality: 16.648%  Short Length of Stay: 32.132%  Long Length of Stay: 5.709%   Assessment and Plan:   1. Severe aortic valve stenosis: He has stage D symptomatic aortic valve stenosis. I have personally reviewed the echo images. The aortic valve is thickened, calcified with limited leaflet mobility. I think he would benefit from AVR. He is 71 yo and his STS risk score is 2.77. He seems to be a good candidate for surgical AVR with consideration of TAVR as another option. I have reviewed the TAVR procedure in detail today with the patient and his  family. He will be referred to see one of the CT surgeons on our TAVR team for a  surgical opinion before proceeding with further planning for TAVR. Given his overall low surgical risk, he may be better served with a surgical AVR and he understands this. Of note, he does report LE arterial disease and AAA, all of which will be addressed prior to either type of AVR.   2. Moderate CAD: He has a patent stent in the mid LAD but diffuse distal LAD disease. This appears to be a poor target for bypass. He has a focal stenosis in the OM branch. The RCA has mild non-obstructive disease. If we proceed with TAVR, will need to address OM lesion with stenting. If the plan is for surgical AVR, will need to consider single vessel bypass of the OM branch.   Current medicines are reviewed at length with the patient today.  The patient does not have concerns regarding medicines.  The following changes have been made:  no change  Labs/ tests ordered today include:  No orders of the defined types were placed in this encounter.    Disposition:   FU with the valve team. Next visit with CT Surgery.    Signed, Lauree Chandler, MD 09/08/2017 12:29 PM    [address], [place], [place]  [address] Phone: [phone]; Fax: [phone]

## 2017-09-18 ENCOUNTER — Ambulatory Visit: Payer: PPO | Admitting: Physician Assistant

## 2017-09-26 ENCOUNTER — Encounter: Payer: PPO | Admitting: Thoracic Surgery (Cardiothoracic Vascular Surgery)

## 2017-09-29 ENCOUNTER — Other Ambulatory Visit: Payer: Self-pay

## 2017-09-29 ENCOUNTER — Institutional Professional Consult (permissible substitution) (INDEPENDENT_AMBULATORY_CARE_PROVIDER_SITE_OTHER): Payer: PPO | Admitting: Thoracic Surgery (Cardiothoracic Vascular Surgery)

## 2017-09-29 ENCOUNTER — Encounter: Payer: Self-pay | Admitting: Thoracic Surgery (Cardiothoracic Vascular Surgery)

## 2017-09-29 VITALS — BP 110/67 | HR 65 | Ht 69.0 in | Wt 235.0 lb

## 2017-09-29 DIAGNOSIS — I2510 Atherosclerotic heart disease of native coronary artery without angina pectoris: Secondary | ICD-10-CM | POA: Diagnosis not present

## 2017-09-29 DIAGNOSIS — I350 Nonrheumatic aortic (valve) stenosis: Secondary | ICD-10-CM | POA: Diagnosis not present

## 2017-09-29 NOTE — Patient Instructions (Addendum)
Continue all previous medications without any changes at this time  Avoid strenuous exertion and call your cardiologist or go to the emergency department if you develop chest discomfort unrelieved by nitroglycerine

## 2017-09-29 NOTE — Progress Notes (Signed)
[address]       ,[place] [address]             [phone]     CARDIOTHORACIC SURGERY CONSULTATION REPORT  Referring Provider is Burnell Blanks , MD Primary Cardiologist is End, Harrell Gave, MD PCP is Vista Lawman Youlanda Roys, MD  Chief Complaint  Patient presents with  . Aortic Stenosis    HPI:  Patient is a 71 year old obese white male with history of aortic stenosis, coronary artery disease status post PCI of left anterior descending coronary artery in 2005, hypertension, type 2 diabetes mellitus, hyperlipidemia, cerebrovascular disease with chronic occlusion of the right internal carotid artery, abdominal aortic aneurysm, Barrett's esophagitis, obstructive sleep apnea on CPAP, and lower extremity peripheral arterial disease who has been referred for surgical consultation to discuss treatment options for management of severe symptomatic aortic stenosis and multivessel coronary artery disease. Patient's cardiac history dates back to 2005 when he presented with symptoms of angina pectoris and abnormal stress test. He was found have high-grade stenosis of the left anterior descending coronary artery and underwent PCI and stenting with overlapping drug-eluting stents by Dr. Doreatha Lew. The patient did well for many years. In follow-up he was noted to have a murmur on physical exam and echocardiograms have demonstrated the presence of aortic stenosis which has progressed in severity. Over the past year the patient has developed worsening symptoms of exertional shortness of breath and chest tightness. Follow-up echocardiogram performed 08/15/2017 revealed severe aortic stenosis with peak velocity across the aortic valve reported greater than 4.2 m/s corresponding to mean transvalvular gradient estimated 42 mmHg. The DVI was 0.17.  Left ventricular systolic function remained normal with ejection fraction estimated 55-60%.  Left and right heart catheterization was performed  08/25/2017 and confirmed the presence of severe aortic stenosis with peak to peak and mean transvalvular gradients measured 71 and 66 mmHg, respectively at catheterization. There was severe multivessel coronary artery disease with diffuse long segment in-stent restenosis of the left anterior descending coronary artery and high-grade 80% stenosis of a large obtuse marginal branch of the left circumflex coronary artery. There was chronic occlusion of the small posterior descending coronary artery off of the distal right coronary artery. Right heart pressures were normal. The patient was referred to Dr. Angelena Form who saw the patient in consultation on 09/08/2017.  The patient was subsequently referred for elective surgical consultation.  The patient is married and lives locally in [place] with his wife. He has been retired for approximately 1 year having previously worked for [employer]. He has remained reasonably active physically and reports no significant physical limitations other than symptoms of progressive exertional shortness of breath and chest discomfort. Symptoms began more than one year ago and have continued to get worse. He now gets short of breath with moderate level activity and frequently gets chest tightness. He had a particularly severe episode of chest tightness and shortness of breath recently when he was at the beach. He states that it took nearly 15 minutes for symptoms resolved and he almost stopped to take a nitroglycerin tablet. He denies any resting shortness of breath, PND, orthopnea, or lower extremity edema. He has had occasional dizzy spells without syncope. He has reported history of peripheral arterial disease although he denies symptoms of claudication. He has known history of infrarenal abdominal aortic aneurysm but he has not been seen in follow-up recently. He has history of chronic occlusion of the right internal  carotid artery that developed after attempted  right carotid endarterectomy in the remote past. Patient denies any history of stroke or recent transient neurologic symptoms.  He did have transient monocular blindness at the time of sudden occlusion of the right internal carotid artery which occurred in the perioperative time after right carotid endarterectomy.  Past Medical History:  Diagnosis Date  . Aortic stenosis    Echo (08/2017): Severe AS with mean gradient of 42 mmHg.  . Barrett's esophagus   . CAD (coronary artery disease) 06/06/2011   Remote stent to the LAD in 2005 // Last cath in 2010 showing diffuse CAD, small PD that is occluded and fills with left to right collaterals, 60-70% 1st OM, with normal EF. Managed medically // Myoview 3/18:  Normal perfusion. LVEF 55% with normal wall motion. This is a low risk study.  . Diabetes mellitus   . ED (erectile dysfunction)   . GERD (gastroesophageal reflux disease)   . Hyperlipidemia   . Hypertension   . IHD (ischemic heart disease)    prior stenting of the LAD in December of 2005; with last cath in 2010 showing diffuse CAD with normal LV function. He is managed medically.   . Obesity   . Obstructive sleep apnea on CPAP   . S/P coronary artery stent placement 2005   LAD    Past Surgical History:  Procedure Laterality Date  . CARDIAC CATHETERIZATION  2010   NORMAL LV FUNCTION. DIFFUSE CORONARY DISEASE WITH THE DISTAL PORTION OF THE SMALL POSTERIOR DESCENDING VESSEL OCCLUDED AND FILLING WITH THE RIGHT COLLATERALS, 60-70% STENOSIS IN THE FIRST OBTUSE MARGINAL VESSEL AND DIFFUSE SMALL VESSEL DISEASE  . CAROTID ENDARTERECTOMY    . CIRCUMCISION    . CORONARY STENT PLACEMENT  2005  . RIGHT/LEFT HEART CATH AND CORONARY ANGIOGRAPHY N/A 08/25/2017   Procedure: RIGHT/LEFT HEART CATH AND CORONARY ANGIOGRAPHY;  Surgeon: Nelva Bush, MD;  Location: [facility];  Service: Cardiovascular;  Laterality: N/A;  . rotator cuff surgery Right     Family History  Problem Relation Age of  Onset  . Cancer Mother   . Heart attack Father 10  . CAD Brother     Social History   Social History  . Marital status: Married    Spouse name: N/A  . Number of children: 3  . Years of education: N/A   Occupational History  . Retired-[employer]    Social History Main Topics  . Smoking status: Former Smoker    Packs/day: 1.00    Years: 20.00    Types: Cigarettes    Quit date: 12/12/1992  . Smokeless tobacco: Never Used  . Alcohol use 0.6 oz/week    1 Cans of beer per week  . Drug use: No  . Sexual activity: Not Currently   Other Topics Concern  . Not on file   Social History Narrative  . No narrative on file    Current Outpatient Prescriptions  Medication Sig Dispense Refill  . acetaminophen (TYLENOL) 500 MG tablet Take 1,000 mg by mouth every 8 (eight) hours as needed for headache.    [name] aspirin 81 MG tablet Take 81 mg by mouth daily.     . cetirizine (ZYRTEC) 10 MG tablet Take 10 mg by mouth daily.     . fluticasone (FLONASE) 50 MCG/ACT nasal spray Place 1 spray into both nostrils daily as needed for allergies or rhinitis.    [name] gabapentin (NEURONTIN) 300 MG capsule Take 600 mg by mouth  2 (two) times daily.     [name] lisinopril (PRINIVIL,ZESTRIL) 10 MG tablet Take 0.5 tablets (5 mg total) by mouth daily. 90 tablet 3  . metoprolol succinate (TOPROL-XL) 50 MG 24 hr tablet TAKE 1 TABLET BY MOUTH  DAILY WITH A MEAL OR  IMMEDIATELY FOLLOWING A  MEAL 90 tablet 3  . Multiple Vitamins-Minerals (ONE-A-DAY 50 PLUS PO) Take 1 tablet by mouth daily.    [name] omeprazole (PRILOSEC OTC) 20 MG tablet Take 20 mg by mouth daily.      Glory Rosebush DELICA LANCETS FINE MISC USE TO TEST ONCE D  2  . ONETOUCH VERIO test strip TEST BS ONCE D  0  . rosuvastatin (CRESTOR) 10 MG tablet TAKE 1 TABLET BY MOUTH  DAILY. 90 tablet 1  . sitaGLIPtin-metformin (JANUMET) 50-1000 MG tablet Take 1 tablet by mouth 2 (two) times daily with a meal.     . nitroGLYCERIN (NITROSTAT) 0.4 MG SL tablet Place  0.4 mg under the tongue every 5 (five) minutes as needed for chest pain.     No current facility-administered medications for this visit.     No Known Allergies    Review of Systems:   General:  normal appetite, no energy, no weight gain, no weight loss, no fever  Cardiac:  + chest pain with exertion, no chest pain at rest, + SOB with exertion, no resting SOB, no PND, no orthopnea, no palpitations, no arrhythmia, no atrial fibrillation, no LE edema, + dizzy spells, no syncope  Respiratory:  + shortness of breath, no home oxygen, no productive cough, no dry cough, no bronchitis, no wheezing, no hemoptysis, no asthma, no pain with inspiration or cough, + sleep apnea, + CPAP at night  GI:   no difficulty swallowing, + reflux, no frequent heartburn, no hiatal hernia, no abdominal pain, no constipation, no diarrhea, no hematochezia, no hematemesis, no melena  GU:   no dysuria,  no frequency, no urinary tract infection, no hematuria, no enlarged prostate, no kidney stones, no kidney disease  Vascular:  no pain suggestive of claudication, no pain in feet, no leg cramps, no varicose veins, no DVT, no non-healing foot ulcer  Neuro:   no stroke, + TIA's, no seizures, no headaches, + temporary blindness one eye,  no slurred speech, no peripheral neuropathy, no chronic pain, no instability of gait, no memory/cognitive dysfunction  Musculoskeletal: mild arthritis in knees, no joint swelling, no myalgias, no difficulty walking, normal mobility   Skin:   no rash, no itching, no skin infections, no pressure sores or ulcerations  Psych:   no anxiety, no depression, no nervousness, no unusual recent stress  Eyes:   no blurry vision, no floaters, no recent vision changes, + wears glasses or contacts  ENT:   no hearing loss, no loose or painful teeth, no dentures, last saw dentist March 2018  Hematologic:  no easy bruising, no abnormal bleeding, no clotting disorder, no frequent epistaxis  Endocrine:  +  diabetes, does check CBG's at home     Physical Exam:   BP 110/67   Pulse 65   Ht 5\' 9"  (1.753 m)   Wt 235 lb (106.6 kg)   SpO2 98%   BMI 34.70 kg/m   General:  Moderately obese,  well-appearing  HEENT:  Unremarkable   Neck:   no JVD, no bruits, no adenopathy   Chest:   clear to auscultation, symmetrical breath sounds, no wheezes, no rhonchi   CV:   RRR, grade IV/VI harsh systolic  murmur best RUSB  Abdomen:  soft, non-tender, no masses   Extremities:  warm, well-perfused, pulses diminished but palpable, no LE edema  Rectal/GU  Deferred  Neuro:   Grossly non-focal and symmetrical throughout  Skin:   Clean and dry, no rashes, no breakdown   Diagnostic Tests:  Transthoracic Echocardiography  Patient:    Ronda, Kazmi MR #:       [id number] Study Date: 08/15/2017 Gender:     M Age:        14 Height:     175.3 cm Weight:     105.7 kg BSA:        2.31 m^2 Pt. Status: Room:   SONOGRAPHER  Dublin, Will  ATTENDING    Kathlen Mody, Scott T  Faylene Million, Scott T  REFERRING    Kathlen Mody, Scott T  PERFORMING   [facility], Outpatient  cc:  ------------------------------------------------------------------- LV EF: 55% -   60%  ------------------------------------------------------------------- Indications:      (I35.0).  ------------------------------------------------------------------- History:   PMH:  Acquired from the patient and from the patient&'s chart.  Chest pain.  Aortic stenosis.  Risk factors:  Hypertension. Diabetes mellitus. Obese. Dyslipidemia.  ------------------------------------------------------------------- Study Conclusions  - Left ventricle: The cavity size was normal. Wall thickness was   increased in a pattern of mild LVH. Systolic function was normal.   The estimated ejection fraction was in the range of 55% to 60%.   Wall motion was normal; there were no regional wall motion   abnormalities. Doppler parameters are consistent with abnormal    left ventricular relaxation (grade 1 diastolic dysfunction). - Aortic valve: Valve mobility was restricted. There was severe   stenosis. There was trivial regurgitation. - Aortic root: The aortic root was mildly dilated. - Mitral valve: Calcified annulus. - Left atrium: The atrium was mildly dilated.  Impressions:  - Normal LV systolic function; mild diastolic dysfunction; mild   LVH; severely calcified aortic valve with severe AS (mean   gradient 42 mmHg) and trace AI; mildly dilated aortic root; mild   LAE.  ------------------------------------------------------------------- Study data:  Comparison was made to the study of 08/22/2016.  Study status:  Routine.  Procedure:  The patient reported no pain pre or post test. Transthoracic echocardiography for left ventricular function evaluation and for assessment of valvular function. Image quality was adequate.  Study completion:  There were no complications.          Transthoracic echocardiography.  M-mode, complete 2D, spectral Doppler, and color Doppler.  Birthdate: Patient birthdate: 01-27-1946.  Age:  Patient is 71 yr old.  Sex: Gender: male.    BMI: 34.4 kg/m^2.  Blood pressure:     118/54 Patient status:  Outpatient.  Study date:  Study date: 08/15/2017. Study time: 07:47 AM.  Location:  [facility]  -------------------------------------------------------------------  ------------------------------------------------------------------- Left ventricle:  The cavity size was normal. Wall thickness was increased in a pattern of mild LVH. Systolic function was normal. The estimated ejection fraction was in the range of 55% to 60%. Wall motion was normal; there were no regional wall motion abnormalities. Doppler parameters are consistent with abnormal left ventricular relaxation (grade 1 diastolic dysfunction).  ------------------------------------------------------------------- Aortic valve:   Trileaflet; severely  calcified leaflets. Valve mobility was restricted.  Doppler:   There was severe stenosis. There was trivial regurgitation.    VTI ratio of LVOT to aortic valve: 0.16. Valve area (VTI): 0.57 cm^2. Indexed valve area (VTI): 0.25 cm^2/m^2. Peak velocity ratio of LVOT  to aortic valve: 0.17. Valve area (Vmax): 0.58 cm^2. Indexed valve area (Vmax): 0.25 cm^2/m^2. Mean velocity ratio of LVOT to aortic valve: 0.17. Valve area (Vmean): 0.58 cm^2. Indexed valve area (Vmean): 0.25 cm^2/m^2.    Mean gradient (S): 42 mm Hg. Peak gradient (S): 73 mm Hg.  ------------------------------------------------------------------- Aorta:  Aortic root: The aortic root was mildly dilated.  ------------------------------------------------------------------- Mitral valve:   Calcified annulus. Mobility was not restricted. Doppler:  Transvalvular velocity was within the normal range. There was no evidence for stenosis. There was trivial regurgitation.  ------------------------------------------------------------------- Left atrium:  The atrium was mildly dilated.  ------------------------------------------------------------------- Right ventricle:  The cavity size was normal. Systolic function was normal.  ------------------------------------------------------------------- Pulmonic valve:    Doppler:  Transvalvular velocity was within the normal range. There was no evidence for stenosis. There was trivial regurgitation.  ------------------------------------------------------------------- Tricuspid valve:   Structurally normal valve.    Doppler: Transvalvular velocity was within the normal range. There was trivial regurgitation.  ------------------------------------------------------------------- Pulmonary artery:   Systolic pressure was within the normal range.   ------------------------------------------------------------------- Right atrium:  The atrium was normal in  size.  ------------------------------------------------------------------- Pericardium:  There was no pericardial effusion.  ------------------------------------------------------------------- Systemic veins: Inferior vena cava: The vessel was normal in size.  ------------------------------------------------------------------- Measurements   Left ventricle                            Value          Reference  LV ID, ED, PLAX chordal           (L)     41.8  mm       43 - 52  LV ID, ES, PLAX chordal                   29.9  mm       23 - 38  LV fx shortening, PLAX chordal    (L)     28    %        >=29  LV PW thickness, ED                       13.43 mm       ---------  IVS/LV PW ratio, ED                       0.94           <=1.3  Stroke volume, 2D                         61    ml       ---------  Stroke volume/bsa, 2D                     26    ml/m^2   ---------  LV ejection fraction, 1-p A4C             60    %        ---------  LV end-diastolic volume, 2-p              145   ml       ---------  LV end-systolic volume, 2-p               61    ml       ---------  LV ejection fraction, 2-p  58    %        ---------  Stroke volume, 2-p                        84    ml       ---------  LV end-diastolic volume/bsa, 2-p          63    ml/m^2   ---------  LV end-systolic volume/bsa, 2-p           26    ml/m^2   ---------  Stroke volume/bsa, 2-p                    36.4  ml/m^2   ---------  LV e&', lateral                            6.53  cm/s     ---------  LV E/e&', lateral                          10.35          ---------  LV e&', medial                             4.97  cm/s     ---------  LV E/e&', medial                           13.6           ---------  LV e&', average                            5.75  cm/s     ---------  LV E/e&', average                          11.76          ---------    Ventricular septum                        Value          Reference  IVS  thickness, ED                         12.63 mm       ---------    LVOT                                      Value          Reference  LVOT ID, S                                21    mm       ---------  LVOT area                                 3.46  cm^2     ---------  LVOT ID  21    mm       ---------  LVOT peak velocity, S                     71.4  cm/s     ---------  LVOT mean velocity, S                     51.3  cm/s     ---------  LVOT VTI, S                               17.5  cm       ---------  LVOT peak gradient, S                     2     mm Hg    ---------  Stroke volume (SV), LVOT DP               60.6  ml       ---------  Stroke index (SV/bsa), LVOT DP            26.3  ml/m^2   ---------    Aortic valve                              Value          Reference  Aortic valve peak velocity, S             426   cm/s     ---------  Aortic valve mean velocity, S             306   cm/s     ---------  Aortic valve VTI, S                       107   cm       ---------  Aortic mean gradient, S                   42    mm Hg    ---------  Aortic peak gradient, S                   73    mm Hg    ---------  VTI ratio, LVOT/AV                        0.16           ---------  Aortic valve area, VTI                    0.57  cm^2     ---------  Aortic valve area/bsa, VTI                0.25  cm^2/m^2 ---------  Velocity ratio, peak, LVOT/AV             0.17           ---------  Aortic valve area, peak velocity          0.58  cm^2     ---------  Aortic valve area/bsa, peak               0.25  cm^2/m^2 ---------  velocity  Velocity ratio, mean, LVOT/AV  0.17           ---------  Aortic valve area, mean velocity          0.58  cm^2     ---------  Aortic valve area/bsa, mean               0.25  cm^2/m^2 ---------  velocity  Aortic regurg pressure half-time          512   ms       ---------    Aorta                                     Value           Reference  Aortic root ID, ED                        38    mm       ---------  Ascending aorta ID, A-P, S                35    mm       ---------    Left atrium                               Value          Reference  LA ID, A-P, ES                            45    mm       ---------  LA ID/bsa, A-P                            1.95  cm/m^2   <=2.2  LA volume, S                              79    ml       ---------  LA volume/bsa, S                          34.3  ml/m^2   ---------  LA volume, ES, 1-p A4C                    81    ml       ---------  LA volume/bsa, ES, 1-p A4C                35.1  ml/m^2   ---------  LA volume, ES, 1-p A2C                    76    ml       ---------  LA volume/bsa, ES, 1-p A2C                33    ml/m^2   ---------    Mitral valve                              Value          Reference  Mitral E-wave peak velocity  67.6  cm/s     ---------  Mitral A-wave peak velocity               70.6  cm/s     ---------  Mitral deceleration time          (H)     243   ms       150 - 230  Mitral E/A ratio, peak                    1              ---------    Pulmonary arteries                        Value          Reference  PA pressure, S, DP                        28    mm Hg    <=30    Tricuspid valve                           Value          Reference  Tricuspid regurg peak velocity            249   cm/s     ---------  Tricuspid peak RV-RA gradient             25    mm Hg    ---------    Systemic veins                            Value          Reference  Estimated CVP                             3     mm Hg    ---------    Right ventricle                           Value          Reference  RV pressure, S, DP                        28    mm Hg    <=30  RV s&', lateral, S                         11.5  cm/s     ---------  Legend: (L)  and  (H)  mark values outside specified reference  range.  ------------------------------------------------------------------- Prepared and Electronically Authenticated by  Kirk Ruths 2018-09-04T09:16:54    RIGHT/LEFT HEART CATH AND CORONARY ANGIOGRAPHY  Conclusion   Findings: 1. Multivessel coronary artery disease including diffuse mild to moderate proximal to mid LAD disease, 50-60% in-stent restenosis of the distal LAD stent, moderate-caliber OM2 branch with 80% proximal disease, and dominant RCA with chronic total occlusion of very small posterior descending artery. Disease predominantly affects small/distal vessels. 2. Severe aortic stenosis with mean transvalvular gradient by pullback of 66 mmHg and peak-to-peak gradient of 71 mmHg. 3. Normal left and right heart filling pressures. 4. Normal Fick cardiac output.  Recommendations:  1. Proceed with TAVR/surgical evaluation for aortic valve replacement. 2. Medical therapy of coronary artery disease pending further evaluation for best valve replacement strategy.  3. Aggressive secondary prevention including low-dose aspirin and statin therapy.  Nelva Bush, MD [facility] Pager: [phone]   Indications   Accelerating angina ([place]) [I20.0 (ICD-10-CM)]  Severe aortic stenosis [I35.0 (ICD-10-CM)]  Procedural Details/Technique   Technical Details Indication: 71 y.o. year-old man with history of coronary artery disease status post overlapping DES to the mid/distal LAD in 2005, severe aortic stenosis (mean gradient 42 mmHg by echo), cerebrovascular disease status post right carotid endarterectomy with subsequent right internal carotid artery occlusion, and diabetes mellitus presents for further evaluation of progressive exertional chest pain with mild activity (CCS class III).  GFR: >60 ml/min  Procedure: The risks, benefits, complications, treatment options, and expected outcomes were discussed with the patient. The patient and/or family concurred with the  proposed plan, giving informed consent. The patient was brought to the cath lab after IV hydration was begun and oral premedication was given. The patient was further sedated with Versed and Fentanyl. The right wrist was assessed with a modified Allens test which was normal. The right and elbow were prepped and draped in a sterile fashion. 1% lidocaine was used for local anesthesia. A previously placed right antecubital vein IV was exchanged for a 44F slender Glidesheath using modified Seldinger technique. 44F slender Glidesheath was inserted using modified Seldinger technique. Right heart catheterization was performed by advancing a 44F balloon-tipped catheter through the right heart chambers into the pulmonary capillary wedge position. Pressure measurements and oxygen saturations were obtained.  Using the modified Seldinger access technique, a 74F slender Glidesheath was placed in the right radial artery. 3 mg Verapamil was given through the sheath. Heparin 5,000 units were administered. Selective coronary angiography was performed using 44F TIG4.0 and JL 3.5 catheters to engage the left coronary artery. A 44F TIG4.0 catheter was used to engage the right coronary artery. Left heart catheterization was performed using a 44F TIG4.0 catheter. Left ventriculogram was not performed.  At the end of the procedure, the radial artery sheath was removed and a TR band applied to achieve patent hemostasis. The right antecubital venous sheath was removed and hemostasis achieved with manual compressions. There were no immediate complications. The patient was taken to the recovery area in stable condition.  Contrast used: 100 mL Isovue Fluoroscopy time: 7.3 min Radiation dose: 635 mGy   Estimated blood loss <50 mL.  During this procedure no sedation was administered.    Complications   Complications documented before study signed (08/25/2017 5:21 PM EDT)    No complications were associated with this study.   Documented by Nelva Bush, MD - 08/25/2017 5:18 PM EDT    Coronary Findings   Dominance: Right  Left Main  Vessel is large. Vessel is angiographically normal. Short LMCA.  Left Anterior Descending  Vessel is large.  Ost LAD to Prox LAD lesion, 40% stenosed. The lesion is eccentric. The lesion is calcified.  Mid LAD lesion, 50% stenosed.  Mid LAD to Dist LAD lesion, 60% stenosed. The lesion was previously treated using a drug eluting stent over 2 years ago. Previously placed stent displays restenosis. Focal, eccentric 60% in-stent restenosis in distal stented segment.  First Diagonal Branch  Vessel is small in size.  Second Diagonal Branch  Vessel is small in size. There is mild disease in the vessel.  Third Diagonal Branch  Vessel is small in size.  Left Circumflex  Vessel is moderate in size.  Mid Cx-1 lesion, 30% stenosed. The lesion is eccentric.  Mid Cx-2 lesion, 40% stenosed.  First Obtuse Marginal Branch  Vessel is small in size.  Second Obtuse Marginal Branch  Vessel is moderate in size.  2nd Mrg lesion, 85% stenosed.  Third Obtuse Marginal Branch  Vessel is moderate in size.  Right Coronary Artery  Vessel is large.  Prox RCA lesion, 25% stenosed. The lesion is mildly calcified.  Mid RCA to Dist RCA lesion, 25% stenosed. The lesion is irregular.  Right Posterior Descending Artery  Vessel is small in size. There is severe disease in the vessel. RPDA filled by collaterals from 3rd Sept.  RPDA lesion, 100% stenosed.  Right Posterior Atrioventricular Branch  Vessel is moderate in size.  Right Heart   Right Heart Pressures RA (mean): 3 mmHg RV (S/EDP): 28/5 mmHg PA (S/D, mean): 28/7 (14) mmHg PCWP (mean): 9 mmHg  Ao sat: 98% PA sat: 72% RA sat: 71%  Fick CO: 6.1 L/min Fick CI: 2.8 L/min/m^2    Left Heart   Left Ventricle LV end diastolic pressure is normal.    Aortic Valve There is severe aortic valve stenosis. Mean gradient: 66 mmHg Peak-to-peak  gradient: 71 mmHg Valve area: 0.8 cm^2    Coronary Diagrams   Diagnostic Diagram       Implants     No implant documentation for this case.  MERGE Images   Show images for CARDIAC CATHETERIZATION   Link to Procedure Log   Procedure Log    Hemo Data    Most Recent Value  Fick Cardiac Output 6.14 L/min  Fick Cardiac Output Index 2.77 (L/min)/BSA  Aortic Mean Gradient 66.1 mmHg  Aortic Peak Gradient 71 mmHg  Aortic Valve Area 0.78  Aortic Value Area Index 0.35 cm2/BSA  RA A Wave 6 mmHg  RA V Wave 3 mmHg  RA Mean 2 mmHg  RV Systolic Pressure 28 mmHg  RV Diastolic Pressure 0 mmHg  RV EDP 5 mmHg  PA Systolic Pressure 23 mmHg  PA Diastolic Pressure 7 mmHg  PA Mean 13 mmHg  PW A Wave 8 mmHg  PW V Wave 9 mmHg  PW Mean 7 mmHg  AO Systolic Pressure 540 mmHg  AO Diastolic Pressure 53 mmHg  AO Mean 74 mmHg  LV Systolic Pressure 086 mmHg  LV Diastolic Pressure 1 mmHg  LV EDP 13 mmHg  Arterial Occlusion Pressure Extended Systolic Pressure 99 mmHg  Arterial Occlusion Pressure Extended Diastolic Pressure 53 mmHg  Arterial Occlusion Pressure Extended Mean Pressure 67 mmHg  Left Ventricular Apex Extended Systolic Pressure 761 mmHg  Left Ventricular Apex Extended Diastolic Pressure 0 mmHg  Left Ventricular Apex Extended EDP Pressure 18 mmHg  QP/QS 1.04  TPVR Index 4.53 HRUI  TSVR Index 26.76 HRUI  PVR SVR Ratio 0.08  TPVR/TSVR Ratio 0.17       STS Risk Calculator  Procedure: AV Replacement  Risk of Mortality: 1.786%  Morbidity or Mortality: 16.757%  Long Length of Stay: 5.448%  Short Length of Stay: 33.336%  Permanent Stroke: 1.792%  Prolonged Ventilation: 8.572%  DSW Infection: 0.256%  Renal Failure: 4.854%  Reoperation: 7.265%    Procedure: AV Replacement + CAB  Risk of Mortality: 2.963%  Morbidity or Mortality: 24.592%  Long Length of Stay: 10.955%  Short Length of Stay: 27.468%  Permanent Stroke: 2.824%  Prolonged Ventilation: 14.637%  DSW  Infection: 0.71%  Renal Failure: 8.462%  Reoperation: 8.693%     Impression:  Patient  has stage D severe symptomatic aortic stenosis and multivessel coronary artery disease. He describes a progressive history of symptoms of exertional shortness of breath and chest tightness consistent with chronic diastolic congestive heart failure and angina pectoris, New York Heart Association functional class IIb.I have personally reviewed the patient' transthoracic echocardiogram and diagnostic cardiac catheterization. The patient's aortic valve is trileaflet with severe calcification, thickening, and restricted leaflet mobility involving all 3 leaflets. Peak velocity across the aortic valve measured well above 4 m/s corresponding to mean transvalvular gradient greater than 40 mmHg. Left ventricular systolic function remains preserved. Diagnostic cardiac catheterization confirmed the presence of severe aortic stenosis and was also notable for severe multivessel coronary artery disease. There is long segment diffuse in-stent restenosis of the mid and distal left anterior descending coronary artery. The terminal segment of the left anterior descending coronary artery appears small and diffusely diseased although may be somewhat underfilled due to the proximal disease.  Additionally, there is discrete high-grade stenosis of a large obtuse marginal branch of the left circumflex coronary artery. In the absence of other complicating features yet to be identified, risks associated with conventional surgical aortic valve replacement with or without concomitant coronary artery bypass grafting would be somewhat elevated by the patient's associated comorbid medical diseases, but still relatively low.  Given the potential benefits of multivessel surgical revascularization in this patient I favor conventional surgical aortic valve replacement with coronary artery bypass grafting. However, the terminal portion of the left anterior  descending coronary artery may or may not be suitable target vessel for grafting at the time of surgery and the discrete stenosis in the left circumflex coronary system could easily be treated with PCI and stenting.  Moreover, the presence of significant aortic pathology which might increase risks associated with conventional surgery could ultimately effect treatment recommendations.   Plan:  The patient and his wife were counseled at length regarding treatment alternatives for management of severe symptomatic aortic stenosis and multivessel coronary artery disease. Alternative approaches such as conventional aortic valve replacement with coronary artery bypass grafing, transcatheter aortic valve replacement with or without PCI and stenting, and continued medical therapy were compared and contrasted at length.  The risks associated with conventional surgical aortic valve replacement were been discussed in detail, as were expectations for post-operative convalescence.  Issues specific to transcatheter aortic valve replacement were discussed including questions about long term valve durability, the potential for paravalvular leak, possible increased risk of need for permanent pacemaker placement, and other technical complications related to the procedure itself.  Long-term prognosis with medical therapy was discussed. This discussion was placed in the context of the patient's own specific clinical presentation and past medical history.  All of their questions been addressed.  At this point the patient and his wife favor proceeding with conventional surgical aortic valve replacement and coronary artery bypass grafting.   At the next step we plan to proceed with cardiac gated CT angiogram of the heart and CT angiogram of the chest, abdomen, and pelvis to further evaluate severity of disease involving the entire aorta and whether not that would substantially affect risks associated with conventional surgery.  Assuming this is not the case we tentatively plan to proceed with aortic valve replacement and coronary artery bypass grafting on 10/26/2017. The patient will return to our office for follow-up prior to surgery on 10/23/2017. During the interim the patient has been counseled to avoid strenuous exertion and to contact his cardiologist or go directly to emergency department if he  develops substernal chest pain or chest tightness unrelieved by administration of nitroglycerin. All of his questions been addressed.   I spent in excess of 90 minutes during the conduct of this office consultation and >50% of this time involved direct face-to-face encounter with the patient for counseling and/or coordination of their care.  Valentina Gu. Roxy Manns, MD 09/29/2017 11:19 AM

## 2017-10-03 ENCOUNTER — Other Ambulatory Visit: Payer: Self-pay | Admitting: *Deleted

## 2017-10-03 DIAGNOSIS — I350 Nonrheumatic aortic (valve) stenosis: Secondary | ICD-10-CM

## 2017-10-18 ENCOUNTER — Other Ambulatory Visit: Payer: Self-pay | Admitting: *Deleted

## 2017-10-20 ENCOUNTER — Ambulatory Visit (HOSPITAL_COMMUNITY)
Admission: RE | Admit: 2017-10-20 | Discharge: 2017-10-20 | Disposition: A | Discharge status: Home / Self Care | Payer: PPO | Source: Ambulatory Visit | Attending: Thoracic Surgery (Cardiothoracic Vascular Surgery) | Admitting: Thoracic Surgery (Cardiothoracic Vascular Surgery)

## 2017-10-20 ENCOUNTER — Ambulatory Visit (HOSPITAL_COMMUNITY): Admission: RE | Admit: 2017-10-20 | Payer: PPO | Source: Ambulatory Visit

## 2017-10-20 DIAGNOSIS — I2510 Atherosclerotic heart disease of native coronary artery without angina pectoris: Secondary | ICD-10-CM | POA: Diagnosis not present

## 2017-10-20 DIAGNOSIS — I350 Nonrheumatic aortic (valve) stenosis: Secondary | ICD-10-CM | POA: Insufficient documentation

## 2017-10-20 DIAGNOSIS — I700 Atherosclerosis of aorta: Secondary | ICD-10-CM | POA: Insufficient documentation

## 2017-10-20 DIAGNOSIS — I062 Rheumatic aortic stenosis with insufficiency: Secondary | ICD-10-CM | POA: Diagnosis not present

## 2017-10-20 MED ORDER — IOPAMIDOL (ISOVUE-370) INJECTION 76%
INTRAVENOUS | Status: AC
  Administered 2017-10-20: 95 mL
  Filled 2017-10-20: qty 100

## 2017-10-23 ENCOUNTER — Encounter: Payer: Self-pay | Admitting: Thoracic Surgery (Cardiothoracic Vascular Surgery)

## 2017-10-23 ENCOUNTER — Ambulatory Visit (HOSPITAL_BASED_OUTPATIENT_CLINIC_OR_DEPARTMENT_OTHER)
Admission: RE | Admit: 2017-10-23 | Discharge: 2017-10-23 | Disposition: A | Discharge status: Home / Self Care | Payer: PPO | Source: Ambulatory Visit | Attending: Thoracic Surgery (Cardiothoracic Vascular Surgery) | Admitting: Thoracic Surgery (Cardiothoracic Vascular Surgery)

## 2017-10-23 ENCOUNTER — Ambulatory Visit (HOSPITAL_COMMUNITY)
Admission: RE | Admit: 2017-10-23 | Discharge: 2017-10-23 | Disposition: A | Discharge status: Home / Self Care | Payer: PPO | Source: Ambulatory Visit | Attending: Thoracic Surgery (Cardiothoracic Vascular Surgery) | Admitting: Thoracic Surgery (Cardiothoracic Vascular Surgery)

## 2017-10-23 ENCOUNTER — Other Ambulatory Visit: Payer: Self-pay

## 2017-10-23 ENCOUNTER — Encounter (HOSPITAL_COMMUNITY): Payer: Self-pay

## 2017-10-23 ENCOUNTER — Encounter: Payer: PPO | Admitting: Thoracic Surgery (Cardiothoracic Vascular Surgery)

## 2017-10-23 ENCOUNTER — Ambulatory Visit: Payer: PPO | Admitting: Thoracic Surgery (Cardiothoracic Vascular Surgery)

## 2017-10-23 ENCOUNTER — Encounter (HOSPITAL_COMMUNITY)
Admission: RE | Admit: 2017-10-23 | Discharge: 2017-10-23 | Disposition: A | Discharge status: Home / Self Care | Payer: PPO | Source: Ambulatory Visit | Attending: Thoracic Surgery (Cardiothoracic Vascular Surgery) | Admitting: Thoracic Surgery (Cardiothoracic Vascular Surgery)

## 2017-10-23 ENCOUNTER — Ambulatory Visit (HOSPITAL_COMMUNITY): Admission: RE | Admit: 2017-10-23 | Payer: PPO | Source: Ambulatory Visit

## 2017-10-23 VITALS — BP 105/64 | HR 64 | Resp 16 | Ht 69.0 in | Wt 238.3 lb

## 2017-10-23 DIAGNOSIS — I350 Nonrheumatic aortic (valve) stenosis: Secondary | ICD-10-CM

## 2017-10-23 DIAGNOSIS — R110 Nausea: Secondary | ICD-10-CM | POA: Diagnosis not present

## 2017-10-23 DIAGNOSIS — Z01818 Encounter for other preprocedural examination: Secondary | ICD-10-CM | POA: Insufficient documentation

## 2017-10-23 DIAGNOSIS — I2510 Atherosclerotic heart disease of native coronary artery without angina pectoris: Secondary | ICD-10-CM

## 2017-10-23 DIAGNOSIS — I080 Rheumatic disorders of both mitral and aortic valves: Secondary | ICD-10-CM | POA: Diagnosis not present

## 2017-10-23 DIAGNOSIS — G4730 Sleep apnea, unspecified: Secondary | ICD-10-CM

## 2017-10-23 DIAGNOSIS — I25810 Atherosclerosis of coronary artery bypass graft(s) without angina pectoris: Secondary | ICD-10-CM | POA: Diagnosis not present

## 2017-10-23 DIAGNOSIS — Z87891 Personal history of nicotine dependence: Secondary | ICD-10-CM | POA: Diagnosis not present

## 2017-10-23 DIAGNOSIS — E1159 Type 2 diabetes mellitus with other circulatory complications: Secondary | ICD-10-CM

## 2017-10-23 DIAGNOSIS — J90 Pleural effusion, not elsewhere classified: Secondary | ICD-10-CM | POA: Diagnosis not present

## 2017-10-23 DIAGNOSIS — Z7982 Long term (current) use of aspirin: Secondary | ICD-10-CM | POA: Diagnosis not present

## 2017-10-23 DIAGNOSIS — Z7951 Long term (current) use of inhaled steroids: Secondary | ICD-10-CM | POA: Diagnosis not present

## 2017-10-23 DIAGNOSIS — I25110 Atherosclerotic heart disease of native coronary artery with unstable angina pectoris: Secondary | ICD-10-CM | POA: Diagnosis present

## 2017-10-23 DIAGNOSIS — Z01810 Encounter for preprocedural cardiovascular examination: Secondary | ICD-10-CM | POA: Insufficient documentation

## 2017-10-23 DIAGNOSIS — J9811 Atelectasis: Secondary | ICD-10-CM | POA: Diagnosis not present

## 2017-10-23 DIAGNOSIS — Z01812 Encounter for preprocedural laboratory examination: Secondary | ICD-10-CM | POA: Insufficient documentation

## 2017-10-23 DIAGNOSIS — Z7984 Long term (current) use of oral hypoglycemic drugs: Secondary | ICD-10-CM | POA: Diagnosis not present

## 2017-10-23 DIAGNOSIS — Z79899 Other long term (current) drug therapy: Secondary | ICD-10-CM | POA: Diagnosis not present

## 2017-10-23 DIAGNOSIS — I10 Essential (primary) hypertension: Secondary | ICD-10-CM | POA: Diagnosis present

## 2017-10-23 DIAGNOSIS — I440 Atrioventricular block, first degree: Secondary | ICD-10-CM | POA: Diagnosis not present

## 2017-10-23 DIAGNOSIS — D62 Acute posthemorrhagic anemia: Secondary | ICD-10-CM | POA: Diagnosis not present

## 2017-10-23 DIAGNOSIS — E669 Obesity, unspecified: Secondary | ICD-10-CM

## 2017-10-23 DIAGNOSIS — I739 Peripheral vascular disease, unspecified: Secondary | ICD-10-CM

## 2017-10-23 DIAGNOSIS — D696 Thrombocytopenia, unspecified: Secondary | ICD-10-CM | POA: Diagnosis not present

## 2017-10-23 DIAGNOSIS — E785 Hyperlipidemia, unspecified: Secondary | ICD-10-CM | POA: Diagnosis present

## 2017-10-23 DIAGNOSIS — Z6835 Body mass index (BMI) 35.0-35.9, adult: Secondary | ICD-10-CM | POA: Insufficient documentation

## 2017-10-23 DIAGNOSIS — G709 Myoneural disorder, unspecified: Secondary | ICD-10-CM

## 2017-10-23 DIAGNOSIS — K219 Gastro-esophageal reflux disease without esophagitis: Secondary | ICD-10-CM

## 2017-10-23 DIAGNOSIS — I358 Other nonrheumatic aortic valve disorders: Secondary | ICD-10-CM | POA: Diagnosis not present

## 2017-10-23 DIAGNOSIS — E1151 Type 2 diabetes mellitus with diabetic peripheral angiopathy without gangrene: Secondary | ICD-10-CM | POA: Diagnosis present

## 2017-10-23 DIAGNOSIS — Z8489 Family history of other specified conditions: Secondary | ICD-10-CM

## 2017-10-23 DIAGNOSIS — G4733 Obstructive sleep apnea (adult) (pediatric): Secondary | ICD-10-CM | POA: Diagnosis present

## 2017-10-23 DIAGNOSIS — Z951 Presence of aortocoronary bypass graft: Secondary | ICD-10-CM | POA: Diagnosis not present

## 2017-10-23 DIAGNOSIS — Z6834 Body mass index (BMI) 34.0-34.9, adult: Secondary | ICD-10-CM | POA: Diagnosis not present

## 2017-10-23 DIAGNOSIS — R000 Tachycardia, unspecified: Secondary | ICD-10-CM | POA: Diagnosis present

## 2017-10-23 DIAGNOSIS — E8770 Fluid overload, unspecified: Secondary | ICD-10-CM | POA: Diagnosis not present

## 2017-10-23 HISTORY — DX: Family history of other specified conditions: Z84.89

## 2017-10-23 HISTORY — DX: Nausea with vomiting, unspecified: R11.2

## 2017-10-23 HISTORY — DX: Other complications of anesthesia, initial encounter: T88.59XA

## 2017-10-23 HISTORY — DX: Malignant hyperthermia due to anesthesia, initial encounter: T88.3XXA

## 2017-10-23 HISTORY — DX: Other specified postprocedural states: Z98.890

## 2017-10-23 HISTORY — DX: Myoneural disorder, unspecified: G70.9

## 2017-10-23 HISTORY — DX: Adverse effect of unspecified anesthetic, initial encounter: T41.45XA

## 2017-10-23 LAB — TYPE AND SCREEN
ABO/RH(D): A POS
Antibody Screen: NEGATIVE

## 2017-10-23 LAB — BLOOD GAS, ARTERIAL
Acid-base deficit: 3.6 mmol/L — ABNORMAL HIGH (ref 0.0–2.0)
Bicarbonate: 20.6 mmol/L (ref 20.0–28.0)
Drawn by: 421801
FIO2: 21
O2 Saturation: 97.3 %
Patient temperature: 98.6
pCO2 arterial: 35.4 mmHg (ref 32.0–48.0)
pH, Arterial: 7.383 (ref 7.350–7.450)
pO2, Arterial: 97.8 mmHg (ref 83.0–108.0)

## 2017-10-23 LAB — SURGICAL PCR SCREEN
MRSA, PCR: NEGATIVE
Staphylococcus aureus: NEGATIVE

## 2017-10-23 LAB — PULMONARY FUNCTION TEST
DL/VA % pred: 80 %
DL/VA: 3.66 ml/min/mmHg/L
DLCO unc % pred: 65 %
DLCO unc: 20.45 ml/min/mmHg
FEF 25-75 Post: 5.39 L/sec
FEF 25-75 Pre: 3.04 L/sec
FEF2575-%Change-Post: 77 %
FEF2575-%Pred-Post: 220 %
FEF2575-%Pred-Pre: 124 %
FEV1-%Change-Post: 9 %
FEV1-%Pred-Post: 115 %
FEV1-%Pred-Pre: 105 %
FEV1-Post: 3.67 L
FEV1-Pre: 3.34 L
FEV1FVC-%Change-Post: 6 %
FEV1FVC-%Pred-Pre: 111 %
FEV6-%Change-Post: 3 %
FEV6-%Pred-Post: 102 %
FEV6-%Pred-Pre: 99 %
FEV6-Post: 4.16 L
FEV6-Pre: 4.03 L
FEV6FVC-%Change-Post: 0 %
FEV6FVC-%Pred-Post: 106 %
FEV6FVC-%Pred-Pre: 105 %
FVC-%Change-Post: 2 %
FVC-%Pred-Post: 96 %
FVC-%Pred-Pre: 94 %
FVC-Post: 4.16 L
FVC-Pre: 4.05 L
Post FEV1/FVC ratio: 88 %
Post FEV6/FVC ratio: 100 %
Pre FEV1/FVC ratio: 83 %
Pre FEV6/FVC Ratio: 99 %
RV % pred: 73 %
RV: 1.74 L
TLC % pred: 84 %
TLC: 5.77 L

## 2017-10-23 LAB — URINALYSIS, ROUTINE W REFLEX MICROSCOPIC
Bilirubin Urine: NEGATIVE
Glucose, UA: 50 mg/dL — AB
Hgb urine dipstick: NEGATIVE
Ketones, ur: NEGATIVE mg/dL
Leukocytes, UA: NEGATIVE
Nitrite: NEGATIVE
Protein, ur: NEGATIVE mg/dL
Specific Gravity, Urine: 1.011 (ref 1.005–1.030)
pH: 6 (ref 5.0–8.0)

## 2017-10-23 LAB — COMPREHENSIVE METABOLIC PANEL
ALT: 22 U/L (ref 17–63)
AST: 30 U/L (ref 15–41)
Albumin: 4.3 g/dL (ref 3.5–5.0)
Alkaline Phosphatase: 43 U/L (ref 38–126)
Anion gap: 8 (ref 5–15)
BUN: 13 mg/dL (ref 6–20)
CO2: 22 mmol/L (ref 22–32)
Calcium: 9.1 mg/dL (ref 8.9–10.3)
Chloride: 104 mmol/L (ref 101–111)
Creatinine, Ser: 1.14 mg/dL (ref 0.61–1.24)
GFR calc Af Amer: >60 mL/min (ref >60)
GFR calc non Af Amer: >60 mL/min (ref >60)
Glucose, Bld: 238 mg/dL — ABNORMAL HIGH (ref 65–99)
Potassium: 4.1 mmol/L (ref 3.5–5.1)
Sodium: 134 mmol/L — ABNORMAL LOW (ref 135–145)
Total Bilirubin: 0.6 mg/dL (ref 0.3–1.2)
Total Protein: 7 g/dL (ref 6.5–8.1)

## 2017-10-23 LAB — CBC
HCT: 38.7 % — ABNORMAL LOW (ref 39.0–52.0)
Hemoglobin: 12.9 g/dL — ABNORMAL LOW (ref 13.0–17.0)
MCH: 31.9 pg (ref 26.0–34.0)
MCHC: 33.3 g/dL (ref 30.0–36.0)
MCV: 95.8 fL (ref 78.0–100.0)
Platelets: 136 10*3/uL — ABNORMAL LOW (ref 150–400)
RBC: 4.04 MIL/uL — ABNORMAL LOW (ref 4.22–5.81)
RDW: 13.3 % (ref 11.5–15.5)
WBC: 7.3 10*3/uL (ref 4.0–10.5)

## 2017-10-23 LAB — HEMOGLOBIN A1C
Hgb A1c MFr Bld: 7.4 % — ABNORMAL HIGH (ref 4.8–5.6)
Mean Plasma Glucose: 165.68 mg/dL

## 2017-10-23 LAB — APTT: aPTT: 33 seconds (ref 24–36)

## 2017-10-23 LAB — PROTIME-INR
INR: 1.04
Prothrombin Time: 13.5 seconds (ref 11.4–15.2)

## 2017-10-23 LAB — ABO/RH: ABO/RH(D): A POS

## 2017-10-23 MED ORDER — ALBUTEROL SULFATE (2.5 MG/3ML) 0.083% IN NEBU
2.5000 mg | INHALATION_SOLUTION | Freq: Once | RESPIRATORY_TRACT | Status: AC
  Administered 2017-10-23: 2.5 mg via RESPIRATORY_TRACT

## 2017-10-23 NOTE — Patient Instructions (Signed)
   Continue taking all current medications without change through the day before surgery.  Have nothing to eat or drink after midnight the night before surgery.  On the morning of surgery do not take any medications   

## 2017-10-23 NOTE — Progress Notes (Signed)
Vascular prelim:  Carotid duplex: Right ICA known occlusion. Left ICA 40-59%, may be elevated due to compensatory flow. UE Doppler: palmar arch Right decreases >50% with radial comp, normal with ulnar comp. Left normal with radial comp, decreases >50% with ulnar comp. ABI WNL.  Landry Mellow, RDMS, RVT

## 2017-10-23 NOTE — Progress Notes (Signed)
[address]       [place] [address]             [phone]     CARDIOTHORACIC SURGERY OFFICE NOTE  Referring Provider is Burnell Blanks, MD Primary Cardiologist is End, Harrell Gave, MD PCP is Karlene Einstein, MD   HPI:  Patient returns to the office today with tentative plans to proceed with aortic valve replacement and coronary artery bypass grafting later this week.  He was originally seen in consultation on September 29, 2017.  Since then he has undergone CT angiography.  He reports no new problems or complaints and he is anxious to proceed with surgery as previously planned.   Current Outpatient Medications  Medication Sig Dispense Refill  . acetaminophen (TYLENOL) 500 MG tablet Take 1,000 mg by mouth every 8 (eight) hours as needed for headache.    [name] aspirin 81 MG tablet Take 81 mg by mouth daily.     . cetirizine (ZYRTEC) 10 MG tablet Take 10 mg by mouth daily.     . fluticasone (FLONASE) 50 MCG/ACT nasal spray Place 2 sprays into both nostrils daily as needed for allergies or rhinitis.     [name] gabapentin (NEURONTIN) 300 MG capsule Take 600 mg by mouth 2 (two) times daily.     [name] lisinopril (PRINIVIL,ZESTRIL) 10 MG tablet Take 0.5 tablets (5 mg total) by mouth daily. 90 tablet 3  . metoprolol succinate (TOPROL-XL) 50 MG 24 hr tablet TAKE 1 TABLET BY MOUTH  DAILY WITH A MEAL OR  IMMEDIATELY FOLLOWING A  MEAL (Patient taking differently: TAKE 50 MG BY MOUTH  DAILY WITH A MEAL OR  IMMEDIATELY FOLLOWING A  MEAL) 90 tablet 3  . Multiple Vitamins-Minerals (ONE-A-DAY 50 PLUS PO) Take 1 tablet by mouth daily.    . nitroGLYCERIN (NITROSTAT) 0.4 MG SL tablet Place 0.4 mg under the tongue every 5 (five) minutes as needed for chest pain.    [name] omeprazole (PRILOSEC OTC) 20 MG tablet Take 20 mg by mouth daily.      . rosuvastatin (CRESTOR) 10 MG tablet TAKE 1 TABLET BY MOUTH  DAILY. (Patient taking differently: TAKE 10 MG BY MOUTH  DAILY.) 90 tablet 1  .  sitaGLIPtin-metformin (JANUMET) 50-1000 MG tablet Take 1 tablet by mouth 2 (two) times daily with a meal.      No current facility-administered medications for this visit.       Physical Exam:   BP 105/64   Pulse 64   Resp 16   Ht 5\' 9"  (1.753 m)   Wt 238 lb 4.8 oz (108.1 kg)   SpO2 98% Comment: ON RA  BMI 35.19 kg/m   General:  Obese but well-appearing  Chest:   Clear to auscultation  CV:   Regular rate and rhythm with prominent harsh systolic murmur  Incisions:  n/a  Abdomen:  Soft nontender  Extremities:  Warm and well perfused  Diagnostic Tests:  CT ANGIOGRAPHY CHEST, ABDOMEN AND PELVIS  TECHNIQUE: Multidetector CT imaging through the chest, abdomen and pelvis was performed using the standard protocol during bolus administration of intravenous contrast. Multiplanar reconstructed images and MIPs were obtained and reviewed to evaluate the vascular anatomy.  CONTRAST:  70mL ISOVUE-370 IOPAMIDOL (ISOVUE-370) INJECTION 76%  COMPARISON:  None.  FINDINGS: CTA CHEST FINDINGS  Cardiovascular: Conventional 3 vessel arch anatomy. The aortic valve is thickened and calcified. Functionally bicuspid aortic valve. There is a median raphe between the right and non coronary  cusps. The aortic root is normal in diameter at 3.8 cm. There is no effacement of the sino-tubular junction. No aneurysmal dilatation of the ascending thoracic aorta. The tubular segment measures 3.4 cm in diameter. The aortic arch and descending thoracic aorta are also normal in caliber. The main and central pulmonary arteries are normal. No pulmonary embolus. Normal coronary artery anatomy. Calcifications are present throughout the coronary arteries. Mild cardiomegaly. There is a 13 mm incomplete defect in the central aspect of the inter atrial septum most consistent with an incomplete ostium secundum ASD. No pericardial effusion.  Mediastinum/Lymph Nodes: Unremarkable CT appearance of the  thyroid gland. No suspicious mediastinal or hilar adenopathy. No soft tissue mediastinal mass. The thoracic esophagus is unremarkable.  Lungs/Pleura: The lungs are clear. No pleural effusion or pneumothorax.  Musculoskeletal/Soft Tissues: No acute abnormalities.  CTA ABDOMEN AND PELVIS FINDINGS  Hepatobiliary: Normal hepatic contour and morphology. No discrete hepatic lesions. Normal appearance of the gallbladder. No intra or extrahepatic biliary ductal dilatation.  Pancreas: No evidence of acute inflammation, mass or ductal dilatation.  Spleen: Normal appearance of the spleen.  No acute abnormality.  Adrenals/Urinary Tract: Normal adrenal glands bilaterally. No evidence of enhancing renal mass, hydronephrosis or nephrolithiasis. Symmetric renal parenchymal enhancement bilaterally. The ureters and bladder are also unremarkable.  Stomach/Bowel: No evidence of obstruction or focal bowel wall thickening. Normal appendix in the right lower quadrant. The terminal ileum is unremarkable. Colonic diverticular disease without CT evidence of active inflammation.  Lymphatic: No suspicious lymphadenopathy.  Reproductive: Normal appearance of the prostate gland.  Other: No ascites or abdominal wall hernia.  Musculoskeletal: No acute fracture or aggressive appearing lytic or blastic osseous lesion.  VASCULAR MEASUREMENTS PERTINENT TO TAVR:  AORTA:  Minimal Aortic Diameter - 16 mm. Minimal luminal diameter just proximal to the bifurcation measures 17 x 7 mm secondary to atherosclerotic plaque. Fusiform ectasia of the distal infrarenal abdominal aorta with a maximal diameter of 2.8 cm.  Severity of Aortic Calcification -  moderate  RIGHT PELVIS:  Right Common Iliac Artery -  Minimal Diameter - 10 mm  Tortuosity - minimal  Calcification - mild to moderate  Right External Iliac Artery -  Minimal Diameter - 7 mm  Tortuosity - mild  Calcification  - minimal  Right Common Femoral Artery -  Minimal Diameter - 10 mm  Tortuosity - minimal  Calcification - heavy  LEFT PELVIS:  Left Common Iliac Artery -  Minimal Diameter - 6 mm  Tortuosity - minimal  Calcification - heavy  Left External Iliac Artery -  Minimal Diameter - 7 mm  Tortuosity - moderate  Calcification - minimal  Left Common Femoral Artery -  Minimal Diameter - 10 mm  Tortuosity - minimal  Calcification - mild  Review of the MIP images confirms the above findings.  IMPRESSION: 1. Functionally bicuspid aortic valve which is thickened, calcified and contains a median raphe between the right and non coronary cusps. 2. Approximately 13 mm incomplete defect in the central aspect of the inter atrial septum most consistent with an incomplete or partial ostium secundum ASD. 3. Ectatic abdominal aorta at risk for aneurysm development. Recommend followup by ultrasound in 5 years. This recommendation follows ACR consensus guidelines: White Paper of the ACR Incidental Findings Committee II on Vascular Findings. J Am Coll Radiol 2013; 10:789-794. 4. Relatively bulky calcification at the aortic bifurcation extending into the left greater than right common iliac arteries. There is a moderate stenosis of the origin of the left common iliac  artery were the arterial lumen narrows to approximately 6 mm. 5. Minimal iliac artery tortuosity. 6. Bulky coral-reef like atherosclerotic plaque along the posterior wall of the right common femoral artery. 7. Cardiomegaly. 8. Coronary artery calcifications. 9.  Aortic Atherosclerosis (ICD10-170.0). 10. Additional TAVR specific measurements as detailed above.  Signed,  Criselda Peaches, MD  [facility]  [facility]   Electronically Signed   By: Jacqulynn Cadet M.D.   On: 10/20/2017 16:25   Impression:  Patient has stage D severe  symptomatic aortic stenosis and multivessel coronary artery disease. He describes a progressive history of symptoms of exertional shortness of breath and chest tightness consistent with chronic diastolic congestive heart failure and angina pectoris, New York Heart Association functional class IIb.I have personally reviewed the patient' transthoracic echocardiogram, diagnostic cardiac catheterization and CT angiogram. The patient's aortic valve is trileaflet with severe calcification, thickening, and restricted leaflet mobility involving all 3 leaflets. Peak velocity across the aortic valve measured well above 4 m/s corresponding to mean transvalvular gradient greater than 40 mmHg. Left ventricular systolic function remains preserved. Diagnostic cardiac catheterization confirmed the presence of severe aortic stenosis and was also notable for severe multivessel coronary artery disease. There is long segment diffuse in-stent restenosis of the mid and distal left anterior descending coronary artery. The terminal segment of the left anterior descending coronary artery appears small and diffusely diseased although may be somewhat underfilled due to the proximal disease.  Additionally, there is discrete high-grade stenosis of a large obtuse marginal branch of the left circumflex coronary artery. In the absence of other complicating features yet to be identified, risks associated with conventional surgical aortic valve replacement with or without concomitant coronary artery bypass grafting would be somewhat elevated by the patient's associated comorbid medical diseases, but still relatively low.  Given the potential benefits of multivessel surgical revascularization in this patient I favor conventional surgical aortic valve replacement with coronary artery bypass grafting. However, the terminal portion of the left anterior descending coronary artery may or may not be suitable target vessel for grafting at the time of  surgery and the discrete stenosis in the left circumflex coronary system could easily be treated with PCI and stenting.    CT angiography reveals normal-sized thoracic aorta although formal imaging of the aortic root with cardiac gating is not currently available for review.  The radiologist who reviewed the patient's scan commented on the possibility that the patient may have a patent foramen ovale or small secundum type atrial septal defect.    Plan:  The patient and his wife were again counseled regarding treatment alternatives for management of severe symptomatic aortic stenosis and multivessel coronary artery disease. Alternative approaches such as conventional aortic valve replacement with coronary artery bypass grafing, transcatheter aortic valve replacement with or without PCI and stenting, and continued medical therapy were compared and contrasted at length.  The risks associated with conventional surgical aortic valve replacement were been discussed in detail, as were expectations for post-operative convalescence.   Discussion was held comparing the relative risks of mechanical valve replacement with need for lifelong anticoagulation versus use of a bioprosthetic tissue valve and the associated potential for late structural valve deterioration and failure.  This discussion was placed in the context of the patient's particular circumstances, and as a result the patient specifically requests that their valve be replaced using a bioprosthetic tissue valve .  The potential advantages and disadvantages associated with use of a rapid-deployment bioprosthetic aortic valve were  discussed, including the risks of paravalvular leak, need for permanent pacemaker placement, and expectations for long-term durability.  The possibility that the patient may have a patent foramen ovale or secundum type atrial septal defect was discussed.  We will plan to investigate this carefully using transesophageal  echocardiogram at the time of surgery and proceed with surgical repair as indicated.  The patient understands and accepts all potential associated risks of surgery including but not limited to risk of death, stroke, myocardial infarction, congestive heart failure, respiratory failure, renal failure, pneumonia, bleeding requiring blood transfusion and or reexploration, arrhythmia, heart block or bradycardia requiring permanent pacemaker, aortic dissection or other major vascular complication, pleural effusions or other delayed complications related to continued congestive heart failure, and other late complications related to valve replacement including structural valve deterioration and failure, thrombosis, endocarditis, or paravalvular leak.    I spent in excess of 15 minutes during the conduct of this office consultation and >50% of this time involved direct face-to-face encounter with the patient for counseling and/or coordination of their care.   Valentina Gu. Roxy Manns, MD 10/23/2017 3:43 PM

## 2017-10-23 NOTE — Progress Notes (Addendum)
Anesthesia PAT Evaluation: Patient is a 71 year old male scheduled for AVR, CABG on 10/26/17 by Dr. Darylene Price. Patient seen at PAT on 10/23/17.  History includes former smoker (quit '94), severe AS, CAD s/p LAD stent 11/12/04, DM2, HTN, HLD, GERD, OSA (CPAP), right carotid endarterectomy 10/30/02.   For anesthesia history, he reports post-operative N/V and family history (sister) of MALIGNANT HYPERTHERMIA (~ 19 at age 70). While having a tubal ligation she spiked a high fever and required an ice bath. He does not believe his sister ever had a muscle biopsy to confirm malignant hyperthermia, but she was instructed to notify her family members of anesthesia reaction felt to be consistent with malignant hyperthermia. Patient has never personally had a MH type reaction, but it also sounds like previous anesthesia plans have been adjusted based on this known family history. His daughter has a son with Autism, so she underwent testing at [facility] and reportedly had a negative muscle biopsy.    - PCP is Dr. Charlcie Cradle at [facility] - Cardiologist is Dr. Harrell Gave End. - Vascular surgeon is Dr. Denyce Robert with [facility] but reportedly he is leaving the practice.   Meds include ASA 81 mg, Zyrtec, Flonase, Neurontin, lisinopril, Toprol XL, Nitro, Prilosec, Crestor, Janumet.  BP (!) 125/56   Pulse 70   Temp 36.9 C   Resp 18   Ht 5\' 9"  (1.753 m)   Wt 238 lb 4.8 oz (108.1 kg)   SpO2 98%   BMI 35.19 kg/m  Heart RRR, III SEM. Lungs clear. Mallampati IV. Prominent front teeth. Beard at least 2 inches in length. Denied history of difficult airway.   EKG 10/23/17: SR with first degree AV block, non-specific ST/T wave abnormality.  Cardiac cath 08/25/17: FINDINGS: 1. Three-vessel coronary artery disease including diffuse mild to moderate proximal to mid LAD disease, 50-60% in-stent restenosis of the distal LAD stent, moderate-caliber OM1 branch  with 80% proximal disease, and dominant RCA with chronic total occlusion of very small posterior descending artery. 2. Severe aortic stenosis with mean transvalvular gradient by pullback of 66 mmHg and peak to peak gradient of 71 mmHg. 3. Normal left and right heart filling pressures. 4. Normal Fick cardiac output. RECOMMENDATIONS: 1. Proceed with TAVR/surgical evaluation for aortic valve replacement. 2. Medical therapy of coronary artery disease pending further evaluation for best valve replacement strategy. 3. Aggressive secondary prevention including low-dose aspirin and statin therapy.  Echo 08/15/17: Study Conclusions - Left ventricle: The cavity size was normal. Wall thickness was   increased in a pattern of mild LVH. Systolic function was normal.   The estimated ejection fraction was in the range of 55% to 60%.   Wall motion was normal; there were no regional wall motion   abnormalities. Doppler parameters are consistent with abnormal   left ventricular relaxation (grade 1 diastolic dysfunction). - Aortic valve: Valve mobility was restricted. There was severe   stenosis. There was trivial regurgitation. - Aortic root: The aortic root was mildly dilated. - Mitral valve: Calcified annulus. - Left atrium: The atrium was mildly dilated. Impressions: - Normal LV systolic function; mild diastolic dysfunction; mild   LVH; severely calcified aortic valve with severe AS (mean   gradient 42 mmHg) and trace AI; mildly dilated aortic root; mild   LAE.  CT Coronary 10/20/17: IMPRESSION: 1. Trileaflet, severely calcified and thickened aortic valve with severely restricted leaflet opening. There are significant calcifications extending into the LVOT. 2. Normal size of  the thoracic aorta with mild diffuse calcifications and no dissection. 3. No thrombus in the left atrial appendage. 4. No ASD or VSD.  Carotid U/S 10/23/17: Final Interpretation: Right Carotid: Right ICA origin  demonstrates 60-79% stenosis, with occlusion into proximal segment. Left Carotid: There is evidence in the left ICA of a 40-59% stenosis. May be compensatory flow.  CTA chest/abd/pelvis 10/20/17: IMPRESSION: 1. Functionally bicuspid aortic valve which is thickened, calcified and contains a median raphe between the right and non coronary cusps. 2. Approximately 13 mm incomplete defect in the central aspect of the inter atrial septum most consistent with an incomplete or partial ostium secundum ASD. 3. Ectatic abdominal aorta at risk for aneurysm development. Recommend followup by ultrasound in 5 years. This recommendation follows ACR consensus guidelines: White Paper of the ACR Incidental Findings Committee II on Vascular Findings. J Am Coll Radiol 2013; 10:789-794. 4. Relatively bulky calcification at the aortic bifurcation extending into the left greater than right common iliac arteries. There is a moderate stenosis of the origin of the left common iliac artery were the arterial lumen narrows to approximately 6 mm. 5. Minimal iliac artery tortuosity. 6. Bulky coral-reef like atherosclerotic plaque along the posterior wall of the right common femoral artery. 7. Cardiomegaly. 8. Coronary artery calcifications. 9.  Aortic Atherosclerosis (ICD10-170.0). 10. Additional TAVR specific measurements as detailed above.  CXR 10/23/17: IMPRESSION: No active cardiopulmonary disease.  PFTs 10/23/17: FVC 4.05 (94%), FEV1 3.34 (105%), DLCO unc 20.45 (65%).  Preoperative labs noted. A1c 7.4. He reports morning CBGs primarily in the 130's.  With family history of malignant hyperthermia, would anticipate need for trigger free induction. Notified anesthesiologist Dr. Lillia Abed of family history of [place] and sent staff message to [place]. I also sent an email to Lelon Perla, CRNA so room can be set up from trigger free induction. 2003 anesthesia records requested, but are pending. I also advised him to  trim his beard prior to surgery. (Update 10/25/17 1:42 PM: Confirmed that Jeani Hawking received email regarding family history of MH. I have also followed up with [facility] regarding 2003 anesthesia regards. So far, they have faxed two different documents, but neither are the anesthesia records. Alma Friendly will continue to search, but likely off-site. She will fax records if she is able to locate.)  George Hugh [facility] Short Stay Center/Anesthesiology Phone [phone] 10/24/2017 9:48 AM

## 2017-10-23 NOTE — Progress Notes (Addendum)
TIR:[id number], Youlanda Roys, MD  Cardiologist: Dr. Loralie Champagne  EKG: 08/18/17 in Epic and today  Stress test:02/14/17, 08/05/09 in EPIC  ECHO:09/29/17 , 08/15/17 in EPIC  Cardiac Cath: 08/25/17 in EPIC  Chest x-ray: denies past year, obtained today  Pt reports that his sister had malignant hyperthermia with a surgery over 30 years ago. Informed Shelby Dubin. PA-C and she will consult with him before he leaves today

## 2017-10-23 NOTE — Pre-Procedure Instructions (Signed)
Theodore Jenkins  10/23/2017      [id number] [place], [place] - [address] [place] [address] Phone: [phone] Fax: [phone]  [facility] - [place], [place] [address] [place] [address] Phone: [phone] Fax: [phone]    Your procedure is scheduled on October 26, 2017.  Report to [facility] Admitting at 530 AM.  Call this number if you have problems the morning of surgery:  [phone]   Remember:  Do not eat food or drink liquids after midnight.  Take these medicines the morning of surgery with A SIP OF WATER acetaminophen (tylenol), cetirizine (zyrtec), fluticasone (flonase), gabapentin (neurontin), metoprolol succinate (toprol XL), omeprazole (prilosec).  7 days prior to surgery STOP taking any Aspirin (unless otherwise instructed by your surgeon), Aleve, Naproxen, Ibuprofen, Motrin, Advil, Goody's, BC's, all herbal medications, fish oil, and all vitamins  Continue all other medications as instructed by your physician except follow the above medication instructions before surgery  WHAT DO I DO ABOUT MY DIABETES MEDICATION?   [name] Do not take oral diabetes medicines (pills) the morning of surgery.  . The day of surgery, do not take other diabetes injectables, including Byetta (exenatide), Bydureon (exenatide ER), Victoza (liraglutide), or Trulicity (dulaglutide).  How to Manage Your Diabetes Before and After Surgery  Why is it important to control my blood sugar before and after surgery? . Improving blood sugar levels before and after surgery helps healing and can limit problems. . A way of improving blood sugar control is eating a healthy diet by: o  Eating less sugar and carbohydrates o  Increasing activity/exercise o  Talking with your doctor about reaching your blood sugar goals . High blood sugars (greater than  180 mg/dL) can raise your risk of infections and slow your recovery, so you will need to focus on controlling your diabetes during the weeks before surgery. . Make sure that the doctor who takes care of your diabetes knows about your planned surgery including the date and location.  How do I manage my blood sugar before surgery? . Check your blood sugar at least 4 times a day, starting 2 days before surgery, to make sure that the level is not too high or low. o Check your blood sugar the morning of your surgery when you wake up and every 2 hours until you get to the Short Stay unit. . If your blood sugar is less than 70 mg/dL, you will need to treat for low blood sugar: o Do not take insulin. o Treat a low blood sugar (less than 70 mg/dL) with  cup of clear juice (cranberry or apple), 4 glucose tablets, OR glucose gel. Recheck blood sugar in 15 minutes after treatment (to make sure it is greater than 70 mg/dL). If your blood sugar is not greater than 70 mg/dL on recheck, call [phone] o  for further instructions. . Report your blood sugar to the short stay nurse when you get to Short Stay.  . If you are admitted to the hospital after surgery: o Your blood sugar will be checked by the staff and you will probably be given insulin after surgery (instead of oral diabetes medicines) to make sure you have good blood sugar levels. o The goal for blood sugar control after surgery is 80-180 mg/dL  Reviewed and Endorsed by [facility]  Health Patient Education Committee, August 2015   Do not wear jewelry, make-up or nail polish.  Do not wear lotions, powders, or perfumes, or deoderant.  Do not shave 48 hours prior to surgery.  Men may shave face and neck.  Do not bring valuables to the hospital.  [facility] is not responsible for any belongings or valuables.  Contacts, dentures or bridgework may not be worn into surgery.  Leave your suitcase in the car.  After surgery it may be brought to your  room.  For patients admitted to the hospital, discharge time will be determined by your treatment team.  Patients discharged the day of surgery will not be allowed to drive home.   Special instructions:  [place]- Preparing For Surgery  Before surgery, you can play an important role. Because skin is not sterile, your skin needs to be as free of germs as possible. You can reduce the number of germs on your skin by washing with CHG (chlorahexidine gluconate) Soap before surgery.  CHG is an antiseptic cleaner which kills germs and bonds with the skin to continue killing germs even after washing.  Please do not use if you have an allergy to CHG or antibacterial soaps. If your skin becomes reddened/irritated stop using the CHG.  Do not shave (including legs and underarms) for at least 48 hours prior to first CHG shower. It is OK to shave your face.  Please follow these instructions carefully.   1. Shower the NIGHT BEFORE SURGERY and the MORNING OF SURGERY with CHG.   2. If you chose to wash your hair, wash your hair first as usual with your normal shampoo.  3. After you shampoo, rinse your hair and body thoroughly to remove the shampoo.  4. Use CHG as you would any other liquid soap. You can apply CHG directly to the skin and wash gently with a scrungie or a clean washcloth.   5. Apply the CHG Soap to your body ONLY FROM THE NECK DOWN.  Do not use on open wounds or open sores. Avoid contact with your eyes, ears, mouth and genitals (private parts). Wash Face and genitals (private parts)  with your normal soap.  6. Wash thoroughly, paying special attention to the area where your surgery will be performed.  7. Thoroughly rinse your body with warm water from the neck down.  8. DO NOT shower/wash with your normal soap after using and rinsing off the CHG Soap.  9. Pat yourself dry with a CLEAN TOWEL.  10. Wear CLEAN PAJAMAS to bed the night before surgery, wear comfortable clothes the  morning of surgery  11. Place CLEAN SHEETS on your bed the night of your first shower and DO NOT SLEEP WITH PETS.    Day of Surgery: Do not apply any deodorants/lotions. Please wear clean clothes to the hospital/surgery center.     Please read over the following fact sheets that you were given. Pain Booklet, Coughing and Deep Breathing, MRSA Information and Surgical Site Infection Prevention

## 2017-10-24 LAB — GLUCOSE, CAPILLARY: Glucose-Capillary: 242 mg/dL — ABNORMAL HIGH (ref 65–99)

## 2017-10-24 NOTE — H&P (Signed)
[address]       [place] [address]             [phone]          CARDIOTHORACIC SURGERY HISTORY AND PHYSICAL EXAM  Referring Provider is Burnell Blanks , MD Primary Cardiologist is End, Harrell Gave, MD PCP is Vista Lawman Youlanda Roys, MD     Chief Complaint  Patient presents with  . Aortic Stenosis    HPI:  Patient is a 71 year old obese white male with history of aortic stenosis, coronary artery disease status post PCI of left anterior descending coronary artery in 2005, hypertension, type 2 diabetes mellitus, hyperlipidemia, cerebrovascular disease with chronic occlusion of the right internal carotid artery, abdominal aortic aneurysm, Barrett's esophagitis, obstructive sleep apnea on CPAP, and lower extremity peripheral arterial disease who has been referred for surgical consultation to discuss treatment options for management of severe symptomatic aortic stenosis and multivessel coronary artery disease. Patient's cardiac history dates back to 2005 when he presented with symptoms of angina pectoris and abnormal stress test. He was found have high-grade stenosis of the left anterior descending coronary artery and underwent PCI and stenting with overlapping drug-eluting stents by Dr. Doreatha Lew. The patient did well for many years. In follow-up he was noted to have a murmur on physical exam and echocardiograms have demonstrated the presence of aortic stenosis which has progressed in severity. Over the past year the patient has developed worsening symptoms of exertional shortness of breath and chest tightness. Follow-up echocardiogram performed 08/15/2017 revealed severe aortic stenosis with peak velocity across the aortic valve reported greater than 4.2 m/s corresponding to mean transvalvular gradient estimated 42 mmHg. The DVI was 0.17.  Left ventricular systolic function remained normal with ejection fraction estimated 55-60%.  Left and right heart  catheterization was performed 08/25/2017 and confirmed the presence of severe aortic stenosis with peak to peak and mean transvalvular gradients measured 71 and 66 mmHg, respectively at catheterization. There was severe multivessel coronary artery disease with diffuse long segment in-stent restenosis of the left anterior descending coronary artery and high-grade 80% stenosis of a large obtuse marginal branch of the left circumflex coronary artery. There was chronic occlusion of the small posterior descending coronary artery off of the distal right coronary artery. Right heart pressures were normal. The patient was referred to Dr. Angelena Form who saw the patient in consultation on 09/08/2017.  The patient was subsequently referred for elective surgical consultation.  The patient is married and lives locally in [place] with his wife. He has been retired for approximately 1 year having previously worked for [employer]. He has remained reasonably active physically and reports no significant physical limitations other than symptoms of progressive exertional shortness of breath and chest discomfort. Symptoms began more than one year ago and have continued to get worse. He now gets short of breath with moderate level activity and frequently gets chest tightness. He had a particularly severe episode of chest tightness and shortness of breath recently when he was at the beach. He states that it took nearly 15 minutes for symptoms resolved and he almost stopped to take a nitroglycerin tablet. He denies any resting shortness of breath, PND, orthopnea, or lower extremity edema. He has had occasional dizzy spells without syncope. He has reported history of peripheral arterial disease although he denies symptoms of claudication. He has known history of infrarenal abdominal aortic aneurysm but he has not been seen in follow-up recently. He  has history of chronic occlusion of the right internal carotid artery  that developed after attempted right carotid endarterectomy in the remote past. Patient denies any history of stroke or recent transient neurologic symptoms.  He did have transient monocular blindness at the time of sudden occlusion of the right internal carotid artery which occurred in the perioperative time after right carotid endarterectomy.  Patient returns to the office today with tentative plans to proceed with aortic valve replacement and coronary artery bypass grafting later this week.  He was originally seen in consultation on September 29, 2017.  Since then he has undergone CT angiography.  He reports no new problems or complaints and he is anxious to proceed with surgery as previously planned.  Past Medical History:  Diagnosis Date  . Aortic stenosis    Echo (08/2017): Severe AS with mean gradient of 42 mmHg.  . Barrett's esophagus   . CAD (coronary artery disease) 06/06/2011   Remote stent to the LAD in 2005 // Last cath in 2010 showing diffuse CAD, small PD that is occluded and fills with left to right collaterals, 60-70% 1st OM, with normal EF. Managed medically // Myoview 3/18:  Normal perfusion. LVEF 55% with normal wall motion. This is a low risk study.  . Complication of anesthesia   . Diabetes mellitus   . ED (erectile dysfunction)   . Family history of adverse reaction to anesthesia 10/23/2017   sister had history of malignant hyperthermia when she was 78, she is 69 years old now  . GERD (gastroesophageal reflux disease)   . Hyperlipidemia   . Hypertension   . IHD (ischemic heart disease)    prior stenting of the LAD in December of 2005; with last cath in 2010 showing diffuse CAD with normal LV function. He is managed medically.   . Malignant hyperthermia    sister had reaction concerning for malignant hyperthermia (~ 1988)  . 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 Obesity   . Obstructive sleep apnea on CPAP   .  PONV (postoperative nausea and vomiting)   . S/P coronary artery stent placement 2005   LAD    Past Surgical History:  Procedure Laterality Date  . CARDIAC CATHETERIZATION  2010   NORMAL LV FUNCTION. DIFFUSE CORONARY DISEASE WITH THE DISTAL PORTION OF THE SMALL POSTERIOR DESCENDING VESSEL OCCLUDED AND FILLING WITH THE RIGHT COLLATERALS, 60-70% STENOSIS IN THE FIRST OBTUSE MARGINAL VESSEL AND DIFFUSE SMALL VESSEL DISEASE  . CAROTID ENDARTERECTOMY    . CIRCUMCISION  1985  . CORONARY STENT PLACEMENT  2005  . rotator cuff surgery Right     Family History  Problem Relation Age of Onset  . Cancer Mother   . Heart attack Father 74  . CAD Brother     Social History Social History   Tobacco Use  . Smoking status: Former Smoker    Packs/day: 1.00    Years: 20.00    Pack years: 20.00    Types: Cigarettes    Last attempt to quit: 12/12/1992    Years since quitting: 24.8  . Smokeless tobacco: Never Used  Substance Use Topics  . Alcohol use: Yes    Alcohol/week: 0.6 oz    Types: 1 Cans of beer per week  . Drug use: No    Prior to Admission medications   Medication Sig Start Date End Date Taking? Authorizing Provider  acetaminophen (TYLENOL) 500 MG tablet Take 1,000 mg by  mouth every 8 (eight) hours as needed for headache.   Yes [provider]  aspirin 81 MG tablet Take 81 mg by mouth daily.    Yes [provider]  cetirizine (ZYRTEC) 10 MG tablet Take 10 mg by mouth daily.    Yes [provider]  fluticasone (FLONASE) 50 MCG/ACT nasal spray Place 2 sprays into both nostrils daily as needed for allergies or rhinitis.    Yes [provider]  gabapentin (NEURONTIN) 300 MG capsule Take 600 mg by mouth 2 (two) times daily.  08/01/17  Yes [provider]  lisinopril (PRINIVIL,ZESTRIL) 10 MG tablet Take 0.5 tablets (5 mg total) by mouth daily. 02/08/17  Yes Weaver, Scott T, PA-C  metoprolol succinate (TOPROL-XL) 50 MG 24 hr tablet TAKE 1 TABLET BY  MOUTH  DAILY WITH A MEAL OR  IMMEDIATELY FOLLOWING A  MEAL Patient taking differently: TAKE 50 MG BY MOUTH  DAILY WITH A MEAL OR  IMMEDIATELY FOLLOWING A  MEAL 09/14/16  Yes Larey Dresser, MD  Multiple Vitamins-Minerals (ONE-A-DAY 50 PLUS PO) Take 1 tablet by mouth daily.   Yes [provider]  nitroGLYCERIN (NITROSTAT) 0.4 MG SL tablet Place 0.4 mg under the tongue every 5 (five) minutes as needed for chest pain.   Yes [provider]  omeprazole (PRILOSEC OTC) 20 MG tablet Take 20 mg by mouth daily.     Yes [provider]  rosuvastatin (CRESTOR) 10 MG tablet TAKE 1 TABLET BY MOUTH  DAILY. Patient taking differently: TAKE 10 MG BY MOUTH  DAILY. 07/13/16  Yes Larey Dresser, MD  sitaGLIPtin-metformin (JANUMET) 50-1000 MG tablet Take 1 tablet by mouth 2 (two) times daily with a meal.  12/19/16  Yes [provider]    No Known Allergies    Review of Systems:              General:                      normal appetite, no energy, no weight gain, no weight loss, no fever             Cardiac:                       + chest pain with exertion, no chest pain at rest, + SOB with exertion, no resting SOB, no PND, no orthopnea, no palpitations, no arrhythmia, no atrial fibrillation, no LE edema, + dizzy spells, no syncope             Respiratory:                 + shortness of breath, no home oxygen, no productive cough, no dry cough, no bronchitis, no wheezing, no hemoptysis, no asthma, no pain with inspiration or cough, + sleep apnea, + CPAP at night             GI:                               no difficulty swallowing, + reflux, no frequent heartburn, no hiatal hernia, no abdominal pain, no constipation, no diarrhea, no hematochezia, no hematemesis, no melena             GU:  no dysuria,  no frequency, no urinary tract infection, no hematuria, no enlarged prostate, no kidney stones, no kidney disease             Vascular:                      no pain suggestive of claudication, no pain in feet, no leg cramps, no varicose veins, no DVT, no non-healing foot ulcer             Neuro:                         no stroke, + TIA's, no seizures, no headaches, + temporary blindness one eye,  no slurred speech, no peripheral neuropathy, no chronic pain, no instability of gait, no memory/cognitive dysfunction             Musculoskeletal:         mild arthritis in knees, no joint swelling, no myalgias, no difficulty walking, normal mobility              Skin:                            no rash, no itching, no skin infections, no pressure sores or ulcerations             Psych:                         no anxiety, no depression, no nervousness, no unusual recent stress             Eyes:                           no blurry vision, no floaters, no recent vision changes, + wears glasses or contacts             ENT:                            no hearing loss, no loose or painful teeth, no dentures, last saw dentist March 2018             Hematologic:               no easy bruising, no abnormal bleeding, no clotting disorder, no frequent epistaxis             Endocrine:                   + diabetes, does check CBG's at home                           Physical Exam:              BP 110/67   Pulse 65   Ht 5\' 9"  (1.753 m)   Wt 235 lb (106.6 kg)   SpO2 98%   BMI 34.70 kg/m              General:                      Moderately obese,  well-appearing             HEENT:  Unremarkable              Neck:                           no JVD, no bruits, no adenopathy              Chest:                          clear to auscultation, symmetrical breath sounds, no wheezes, no rhonchi              CV:                              RRR, grade IV/VI harsh systolic murmur best RUSB             Abdomen:                    soft, non-tender, no masses              Extremities:                 warm, well-perfused, pulses diminished but palpable,  no LE edema             Rectal/GU                   Deferred             Neuro:                         Grossly non-focal and symmetrical throughout             Skin:                            Clean and dry, no rashes, no breakdown   Diagnostic Tests:  Transthoracic Echocardiography  Patient: Dovber, Ernest MR #: [id number] Study Date: 08/15/2017 Gender: M Age: 53 Height: 175.3 cm Weight: 105.7 kg BSA: 2.31 m^2 Pt. Status: Room:  SONOGRAPHER Foster, Will ATTENDING Kathlen Mody, Scott T Faylene Million, Scott T REFERRING Kathlen Mody, Scott T PERFORMING [facility], Outpatient  cc:  ------------------------------------------------------------------- LV EF: 55% - 60%  ------------------------------------------------------------------- Indications: (I35.0).  ------------------------------------------------------------------- History: PMH: Acquired from the patient and from the patient&'s chart. Chest pain. Aortic stenosis. Risk factors: Hypertension. Diabetes mellitus. Obese. Dyslipidemia.  ------------------------------------------------------------------- Study Conclusions  - Left ventricle: The cavity size was normal. Wall thickness was increased in a pattern of mild LVH. Systolic function was normal. The estimated ejection fraction was in the range of 55% to 60%. Wall motion was normal; there were no regional wall motion abnormalities. Doppler parameters are consistent with abnormal left ventricular relaxation (grade 1 diastolic dysfunction). - Aortic valve: Valve mobility was restricted. There was severe stenosis. There was trivial regurgitation. - Aortic root: The aortic root was mildly dilated. - Mitral valve: Calcified annulus. - Left atrium: The atrium was mildly dilated.  Impressions:  - Normal LV systolic function; mild diastolic dysfunction; mild LVH; severely calcified aortic  valve with severe AS (mean gradient 42 mmHg) and trace AI; mildly dilated aortic root; mild LAE.  ------------------------------------------------------------------- Study data: Comparison was made to the study of 08/22/2016. Study status: Routine. Procedure: The patient reported no pain pre or post test. Transthoracic echocardiography for left ventricular function evaluation  and for assessment of valvular function. Image quality was adequate. Study completion: There were no complications. Transthoracic echocardiography. M-mode, complete 2D, spectral Doppler, and color Doppler. Birthdate: Patient birthdate: Jul 01, 1946. Age: Patient is 71 yr old. Sex: Gender: male. BMI: 34.4 kg/m^2. Blood pressure: 118/54 Patient status: Outpatient. Study date: Study date: 08/15/2017. Study time: 07:47 AM. Location: [facility]  -------------------------------------------------------------------  ------------------------------------------------------------------- Left ventricle: The cavity size was normal. Wall thickness was increased in a pattern of mild LVH. Systolic function was normal. The estimated ejection fraction was in the range of 55% to 60%. Wall motion was normal; there were no regional wall motion abnormalities. Doppler parameters are consistent with abnormal left ventricular relaxation (grade 1 diastolic dysfunction).  ------------------------------------------------------------------- Aortic valve: Trileaflet; severely calcified leaflets. Valve mobility was restricted. Doppler: There was severe stenosis. There was trivial regurgitation. VTI ratio of LVOT to aortic valve: 0.16. Valve area (VTI): 0.57 cm^2. Indexed valve area (VTI): 0.25 cm^2/m^2. Peak velocity ratio of LVOT to aortic valve: 0.17. Valve area (Vmax): 0.58 cm^2. Indexed valve area (Vmax): 0.25 cm^2/m^2. Mean velocity ratio of LVOT to aortic valve: 0.17. Valve area  (Vmean): 0.58 cm^2. Indexed valve area (Vmean): 0.25 cm^2/m^2. Mean gradient (S): 42 mm Hg. Peak gradient (S): 73 mm Hg.  ------------------------------------------------------------------- Aorta: Aortic root: The aortic root was mildly dilated.  ------------------------------------------------------------------- Mitral valve: Calcified annulus. Mobility was not restricted. Doppler: Transvalvular velocity was within the normal range. There was no evidence for stenosis. There was trivial regurgitation.  ------------------------------------------------------------------- Left atrium: The atrium was mildly dilated.  ------------------------------------------------------------------- Right ventricle: The cavity size was normal. Systolic function was normal.  ------------------------------------------------------------------- Pulmonic valve: Doppler: Transvalvular velocity was within the normal range. There was no evidence for stenosis. There was trivial regurgitation.  ------------------------------------------------------------------- Tricuspid valve: Structurally normal valve. Doppler: Transvalvular velocity was within the normal range. There was trivial regurgitation.  ------------------------------------------------------------------- Pulmonary artery: Systolic pressure was within the normal range.  ------------------------------------------------------------------- Right atrium: The atrium was normal in size.  ------------------------------------------------------------------- Pericardium: There was no pericardial effusion.  ------------------------------------------------------------------- Systemic veins: Inferior vena cava: The vessel was normal in size.  ------------------------------------------------------------------- Measurements  Left ventricle Value Reference LV ID, ED, PLAX chordal  (L) 41.8 mm 43 - 52 LV ID, ES, PLAX chordal 29.9 mm 23 - 38 LV fx shortening, PLAX chordal (L) 28 % >=29 LV PW thickness, ED 13.43 mm --------- IVS/LV PW ratio, ED 0.94 <=1.3 Stroke volume, 2D 61 ml --------- Stroke volume/bsa, 2D 26 ml/m^2 --------- LV ejection fraction, 1-p A4C 60 % --------- LV end-diastolic volume, 2-p 811 ml --------- LV end-systolic volume, 2-p 61 ml --------- LV ejection fraction, 2-p 58 % --------- Stroke volume, 2-p 84 ml --------- LV end-diastolic volume/bsa, 2-p 63 ml/m^2 --------- LV end-systolic volume/bsa, 2-p 26 ml/m^2 --------- Stroke volume/bsa, 2-p 36.4 ml/m^2 --------- LV e&', lateral 6.53 cm/s --------- LV E/e&', lateral 10.35 --------- LV e&', medial 4.97 cm/s --------- LV E/e&', medial 13.6 --------- LV e&', average 5.75 cm/s --------- LV E/e&', average 11.76 ---------  Ventricular septum Value Reference IVS thickness, ED 12.63 mm ---------  LVOT Value Reference LVOT ID, S 21 mm --------- LVOT area 3.46 cm^2 --------- LVOT ID 21 mm --------- LVOT peak velocity, S 71.4 cm/s --------- LVOT mean velocity, S  51.3 cm/s --------- LVOT VTI, S 17.5 cm --------- LVOT peak gradient, S 2 mm Hg --------- Stroke volume (SV), LVOT DP 60.6 ml --------- Stroke index (SV/bsa), LVOT DP 26.3 ml/m^2 ---------  Aortic valve Value Reference Aortic valve peak velocity, S 426 cm/s ---------  Aortic valve mean velocity, S 306 cm/s --------- Aortic valve VTI, S 107 cm --------- Aortic mean gradient, S 42 mm Hg --------- Aortic peak gradient, S 73 mm Hg --------- VTI ratio, LVOT/AV 0.16 --------- Aortic valve area, VTI 0.57 cm^2 --------- Aortic valve area/bsa, VTI 0.25 cm^2/m^2 --------- Velocity ratio, peak, LVOT/AV 0.17 --------- Aortic valve area, peak velocity 0.58 cm^2 --------- Aortic valve area/bsa, peak 0.25 cm^2/m^2 --------- velocity Velocity ratio, mean, LVOT/AV 0.17 --------- Aortic valve area, mean velocity 0.58 cm^2 --------- Aortic valve area/bsa, mean 0.25 cm^2/m^2 --------- velocity Aortic regurg pressure half-time 512 ms ---------  Aorta Value Reference Aortic root ID, ED 38 mm --------- Ascending aorta ID, A-P, S 35 mm ---------  Left atrium Value Reference LA ID, A-P, ES 45 mm --------- LA ID/bsa, A-P 1.95 cm/m^2 <=2.2 LA volume, S 79 ml --------- LA  volume/bsa, S 34.3 ml/m^2 --------- LA volume, ES, 1-p A4C 81 ml --------- LA volume/bsa, ES, 1-p A4C 35.1 ml/m^2 --------- LA volume, ES, 1-p A2C 76 ml --------- LA volume/bsa, ES, 1-p A2C 33 ml/m^2 ---------  Mitral valve Value Reference Mitral E-wave peak velocity 67.6 cm/s --------- Mitral A-wave peak velocity 70.6 cm/s --------- Mitral deceleration time (H) 243 ms 150 - 230 Mitral E/A ratio, peak 1 ---------  Pulmonary arteries Value Reference PA pressure, S, DP 28 mm Hg <=30  Tricuspid valve Value Reference Tricuspid regurg peak velocity 249 cm/s --------- Tricuspid peak RV-RA gradient 25 mm Hg ---------  Systemic veins Value Reference Estimated CVP 3 mm Hg ---------  Right ventricle Value Reference RV pressure, S, DP 28 mm Hg <=30 RV s&', lateral, S 11.5 cm/s ---------  Legend: (L) and (H) mark values outside specified reference range.  ------------------------------------------------------------------- Prepared and Electronically Authenticated by  Kirk Ruths 2018-09-04T09:16:54    RIGHT/LEFT HEART CATH AND CORONARY ANGIOGRAPHY  Conclusion   Findings: 1. Multivessel coronary artery disease including diffuse mild to moderate proximal to mid LAD disease, 50-60% in-stent restenosis of the distal LAD stent, moderate-caliber OM2 branch with 80% proximal disease, and dominant RCA with chronic total  occlusion of very small posterior descending artery. Disease predominantly affects small/distal vessels. 2. Severe aortic stenosis with mean transvalvular gradient by pullback of 66 mmHg and peak-to-peak gradient of 71 mmHg. 3. Normal left and right heart filling pressures. 4. Normal Fick cardiac output.  Recommendations: 1. Proceed with TAVR/surgical evaluation for aortic valve replacement. 2. Medical therapy of coronary artery disease pending further evaluation for best valve replacement strategy. 3. Aggressive secondary prevention including low-dose aspirin and statin therapy.  Nelva Bush, MD [facility] Pager: [phone]   Indications   Accelerating angina ([place]) [I20.0 (ICD-10-CM)]  Severe aortic stenosis [I35.0 (ICD-10-CM)]  Procedural Details/Technique   Technical Details Indication: 71 y.o. year-old man with history of coronary artery disease status post overlapping DES to the mid/distal LAD in 2005, severe aortic stenosis (mean gradient 42 mmHg by echo), cerebrovascular disease status post right carotid endarterectomy with subsequent right internal carotid artery occlusion, and diabetes mellitus presents for further evaluation of progressive exertional chest pain with mild activity (CCS class III).  GFR: >60 ml/min  Procedure: The risks, benefits, complications, treatment options, and expected outcomes were discussed with the patient. The patient and/or family concurred with the proposed plan, giving informed consent. The patient was brought to the cath lab after IV hydration was begun and oral premedication was given. The patient was further sedated with Versed and Fentanyl. The right wrist was assessed with a modified Allens test which was normal.  The right and elbow were prepped and draped in a sterile fashion. 1% lidocaine was used for local anesthesia. A previously placed right antecubital vein IV was exchanged for a 59F slender Glidesheath using modified  Seldinger technique. 59F slender Glidesheath was inserted using modified Seldinger technique. Right heart catheterization was performed by advancing a 59F balloon-tipped catheter through the right heart chambers into the pulmonary capillary wedge position. Pressure measurements and oxygen saturations were obtained.  Using the modified Seldinger access technique, a 66F slender Glidesheath was placed in the right radial artery. 3 mg Verapamil was given through the sheath. Heparin 5,000 units were administered. Selective coronary angiography was performed using 59F TIG4.0 and JL 3.5 catheters to engage the left coronary artery. A 59F TIG4.0 catheter was used to engage the right coronary artery. Left heart catheterization was performed using a 59F TIG4.0 catheter. Left ventriculogram was not performed.  At the end of the procedure, the radial artery sheath was removed and a TR band applied to achieve patent hemostasis. The right antecubital venous sheath was removed and hemostasis achieved with manual compressions. There were no immediate complications. The patient was taken to the recovery area in stable condition.  Contrast used: 100 mL Isovue Fluoroscopy time: 7.3 min Radiation dose: 635 mGy   Estimated blood loss <50 mL.  During this procedure no sedation was administered.    Complications   Complications documented before study signed (08/25/2017 5:21 PM EDT)    No complications were associated with this study.  Documented by Nelva Bush, MD - 08/25/2017 5:18 PM EDT    Coronary Findings   Dominance: Right  Left Main  Vessel is large. Vessel is angiographically normal. Short LMCA.  Left Anterior Descending  Vessel is large.  Ost LAD to Prox LAD lesion, 40% stenosed. The lesion is eccentric. The lesion is calcified.  Mid LAD lesion, 50% stenosed.  Mid LAD to Dist LAD lesion, 60% stenosed. The lesion was previously treated using a drug eluting stent over 2 years ago. Previously placed  stent displays restenosis. Focal, eccentric 60% in-stent restenosis in distal stented segment.  First Diagonal Branch  Vessel is small in size.  Second Diagonal Branch  Vessel is small in size. There is mild disease in the vessel.  Third Diagonal Branch  Vessel is small in size.  Left Circumflex  Vessel is moderate in size.  Mid Cx-1 lesion, 30% stenosed. The lesion is eccentric.  Mid Cx-2 lesion, 40% stenosed.  First Obtuse Marginal Branch  Vessel is small in size.  Second Obtuse Marginal Branch  Vessel is moderate in size.  2nd Mrg lesion, 85% stenosed.  Third Obtuse Marginal Branch  Vessel is moderate in size.  Right Coronary Artery  Vessel is large.  Prox RCA lesion, 25% stenosed. The lesion is mildly calcified.  Mid RCA to Dist RCA lesion, 25% stenosed. The lesion is irregular.  Right Posterior Descending Artery  Vessel is small in size. There is severe disease in the vessel. RPDA filled by collaterals from 3rd Sept.  RPDA lesion, 100% stenosed.  Right Posterior Atrioventricular Branch  Vessel is moderate in size.  Right Heart   Right Heart Pressures RA (mean): 3 mmHg RV (S/EDP): 28/5 mmHg PA (S/D, mean): 28/7 (14) mmHg PCWP (mean): 9 mmHg  Ao sat: 98% PA sat: 72% RA sat: 71%  Fick CO: 6.1 L/min Fick CI: 2.8 L/min/m^2    Left Heart   Left Ventricle LV end diastolic pressure is normal.    Aortic Valve There is  severe aortic valve stenosis. Mean gradient: 66 mmHg Peak-to-peak gradient: 71 mmHg Valve area: 0.8 cm^2    Coronary Diagrams   Diagnostic Diagram       Implants        No implant documentation for this case.  MERGE Images   Show images for CARDIAC CATHETERIZATION   Link to Procedure Log   Procedure Log    Hemo Data    Most Recent Value  Fick Cardiac Output 6.14 L/min  Fick Cardiac Output Index 2.77 (L/min)/BSA  Aortic Mean Gradient 66.1 mmHg  Aortic Peak Gradient 71 mmHg  Aortic Valve Area 0.78  Aortic Value Area Index  0.35 cm2/BSA  RA A Wave 6 mmHg  RA V Wave 3 mmHg  RA Mean 2 mmHg  RV Systolic Pressure 28 mmHg  RV Diastolic Pressure 0 mmHg  RV EDP 5 mmHg  PA Systolic Pressure 23 mmHg  PA Diastolic Pressure 7 mmHg  PA Mean 13 mmHg  PW A Wave 8 mmHg  PW V Wave 9 mmHg  PW Mean 7 mmHg  AO Systolic Pressure 240 mmHg  AO Diastolic Pressure 53 mmHg  AO Mean 74 mmHg  LV Systolic Pressure 973 mmHg  LV Diastolic Pressure 1 mmHg  LV EDP 13 mmHg  Arterial Occlusion Pressure Extended Systolic Pressure 99 mmHg  Arterial Occlusion Pressure Extended Diastolic Pressure 53 mmHg  Arterial Occlusion Pressure Extended Mean Pressure 67 mmHg  Left Ventricular Apex Extended Systolic Pressure 532 mmHg  Left Ventricular Apex Extended Diastolic Pressure 0 mmHg  Left Ventricular Apex Extended EDP Pressure 18 mmHg  QP/QS 1.04  TPVR Index 4.53 HRUI  TSVR Index 26.76 HRUI  PVR SVR Ratio 0.08  TPVR/TSVR Ratio 0.17       STS Risk Calculator  Procedure: AV Replacement  Risk of Mortality: 1.786%  Morbidity or Mortality: 16.757%  Long Length of Stay: 5.448%  Short Length of Stay: 33.336%  Permanent Stroke: 1.792%  Prolonged Ventilation: 8.572%  DSW Infection: 0.256%  Renal Failure: 4.854%  Reoperation: 7.265%    Procedure: AV Replacement + CAB  Risk of Mortality: 2.963%  Morbidity or Mortality: 24.592%  Long Length of Stay: 10.955%  Short Length of Stay: 27.468%  Permanent Stroke: 2.824%  Prolonged Ventilation: 14.637%  DSW Infection: 0.71%  Renal Failure: 8.462%  Reoperation: 8.693%    CT ANGIOGRAPHY CHEST, ABDOMEN AND PELVIS  TECHNIQUE: Multidetector CT imaging through the chest, abdomen and pelvis was performed using the standard protocol during bolus administration of intravenous contrast. Multiplanar reconstructed images and MIPs were obtained and reviewed to evaluate the vascular anatomy.  CONTRAST: 85mL ISOVUE-370 IOPAMIDOL (ISOVUE-370) INJECTION 76%  COMPARISON:  None.  FINDINGS: CTA CHEST FINDINGS  Cardiovascular: Conventional 3 vessel arch anatomy. The aortic valve is thickened and calcified. Functionally bicuspid aortic valve. There is a median raphe between the right and non coronary cusps. The aortic root is normal in diameter at 3.8 cm. There is no effacement of the sino-tubular junction. No aneurysmal dilatation of the ascending thoracic aorta. The tubular segment measures 3.4 cm in diameter. The aortic arch and descending thoracic aorta are also normal in caliber. The main and central pulmonary arteries are normal. No pulmonary embolus. Normal coronary artery anatomy. Calcifications are present throughout the coronary arteries. Mild cardiomegaly. There is a 13 mm incomplete defect in the central aspect of the inter atrial septum most consistent with an incomplete ostium secundum ASD. No pericardial effusion.  Mediastinum/Lymph Nodes: Unremarkable CT appearance of the thyroid gland. No suspicious mediastinal or hilar  adenopathy. No soft tissue mediastinal mass. The thoracic esophagus is unremarkable.  Lungs/Pleura: The lungs are clear. No pleural effusion or pneumothorax.  Musculoskeletal/Soft Tissues: No acute abnormalities.  CTA ABDOMEN AND PELVIS FINDINGS  Hepatobiliary: Normal hepatic contour and morphology. No discrete hepatic lesions. Normal appearance of the gallbladder. No intra or extrahepatic biliary ductal dilatation.  Pancreas: No evidence of acute inflammation, mass or ductal dilatation.  Spleen: Normal appearance of the spleen. No acute abnormality.  Adrenals/Urinary Tract: Normal adrenal glands bilaterally. No evidence of enhancing renal mass, hydronephrosis or nephrolithiasis. Symmetric renal parenchymal enhancement bilaterally. The ureters and bladder are also unremarkable.  Stomach/Bowel: No evidence of obstruction or focal bowel wall thickening. Normal appendix in the right lower quadrant.  The terminal ileum is unremarkable. Colonic diverticular disease without CT evidence of active inflammation.  Lymphatic: No suspicious lymphadenopathy.  Reproductive: Normal appearance of the prostate gland.  Other: No ascites or abdominal wall hernia.  Musculoskeletal: No acute fracture or aggressive appearing lytic or blastic osseous lesion.  VASCULAR MEASUREMENTS PERTINENT TO TAVR:  AORTA:  Minimal Aortic Diameter - 16 mm. Minimal luminal diameter just proximal to the bifurcation measures 17 x 7 mm secondary to atherosclerotic plaque. Fusiform ectasia of the distal infrarenal abdominal aorta with a maximal diameter of 2.8 cm.  Severity of Aortic Calcification - moderate  RIGHT PELVIS:  Right Common Iliac Artery -  Minimal Diameter - 10 mm  Tortuosity - minimal  Calcification - mild to moderate  Right External Iliac Artery -  Minimal Diameter - 7 mm  Tortuosity - mild  Calcification - minimal  Right Common Femoral Artery -  Minimal Diameter - 10 mm  Tortuosity - minimal  Calcification - heavy  LEFT PELVIS:  Left Common Iliac Artery -  Minimal Diameter - 6 mm  Tortuosity - minimal  Calcification - heavy  Left External Iliac Artery -  Minimal Diameter - 7 mm  Tortuosity - moderate  Calcification - minimal  Left Common Femoral Artery -  Minimal Diameter - 10 mm  Tortuosity - minimal  Calcification - mild  Review of the MIP images confirms the above findings.  IMPRESSION: 1. Functionally bicuspid aortic valve which is thickened, calcified and contains a median raphe between the right and non coronary cusps. 2. Approximately 13 mm incomplete defect in the central aspect of the inter atrial septum most consistent with an incomplete or partial ostium secundum ASD. 3. Ectatic abdominal aorta at risk for aneurysm development. Recommend followup by ultrasound in 5 years. This recommendation follows ACR  consensus guidelines: White Paper of the ACR Incidental Findings Committee II on Vascular Findings. J Am Coll Radiol 2013; 10:789-794. 4. Relatively bulky calcification at the aortic bifurcation extending into the left greater than right common iliac arteries. There is a moderate stenosis of the origin of the left common iliac artery were the arterial lumen narrows to approximately 6 mm. 5. Minimal iliac artery tortuosity. 6. Bulky coral-reef like atherosclerotic plaque along the posterior wall of the right common femoral artery. 7. Cardiomegaly. 8. Coronary artery calcifications. 9. Aortic Atherosclerosis (ICD10-170.0). 10. Additional TAVR specific measurements as detailed above.  Signed,  Criselda Peaches, MD  [facility]  [facility]   Electronically Signed By: Jacqulynn Cadet M.D. On: 10/20/2017 16:25  Cardiac TAVR CT  TECHNIQUE: The patient was scanned on a [employer]. A 120 kV retrospective scan was triggered in the descending thoracic aorta at 111 HU's. Gantry rotation speed was 250 msecs and collimation was .  6 mm. No beta blockade or nitro were given. The 3D data set was reconstructed in 5% intervals of the R-R cycle. Systolic and diastolic phases were analyzed on a dedicated work station using MPR, MIP and VRT modes. The patient received 80 cc of contrast.  FINDINGS: Aortic Valve: Trileaflet, severely calcified and thickened aortic valve with severely restricted leaflet opening.  Aorta:  Normal size, mild diffuse calcifications, no dissection.  Sinotubular Junction:  26 x 26 mm  Ascending Thoracic Aorta:  33 x 33 mm  Aortic Arch:  25 x 24 mm  Descending Thoracic Aorta:  27 x 26 mm  Sinus of Valsalva Measurements:  Non-coronary:  37 mm  Right -coronary:  36 mm  Left -coronary:  36 mm  Coronary Artery Height above Annulus:  Left Main:  13 mm  Right Coronary:   14 mm  Virtual Basal Annulus Measurements:  Maximum/Minimum Diameter:  31.4 x 25.5 mm  Perimeter:  88 mm  Area:  595 mm2  Coronary Arteries:  Normal origin.  IMPRESSION: 1. Trileaflet, severely calcified and thickened aortic valve with severely restricted leaflet opening. There are significant calcifications extending into the LVOT.  2. Normal size of the thoracic aorta with mild diffuse calcifications and no dissection.  3. No thrombus in the left atrial appendage.  4. No ASD or VSD.   Electronically Signed   By: Ena Dawley   On: 10/23/2017 18:27    Impression:  Patient has stage D severe symptomatic aortic stenosis and multivessel coronary artery disease. He describes a progressive history of symptoms of exertional shortness of breath and chest tightness consistent with chronic diastolic congestive heart failure and angina pectoris, [place] Heart Association functional class IIb.I have personally reviewed the patient' transthoracic echocardiogram, diagnostic cardiac catheterization and CT angiogram. The patient's aortic valve is trileaflet with severe calcification, thickening, and restricted leaflet mobility involving all 3 leaflets. Peak velocity across the aortic valve measured well above 4 m/s corresponding to mean transvalvular gradient greater than 40 mmHg. Left ventricular systolic function remains preserved. Diagnostic cardiac catheterization confirmed the presence of severe aortic stenosis and was also notable for severe multivessel coronary artery disease. There is long segment diffuse in-stent restenosis of the mid and distal left anterior descending coronary artery. The terminal segment of the left anterior descending coronary artery appears small and diffusely diseased although may be somewhat underfilled due to the proximal disease. Additionally, there is discrete high-grade stenosis of a large obtuse marginal branch of the left circumflex  coronary artery. In the absence of other complicating features yet to be identified, risks associated with conventional surgical aortic valve replacement with or without concomitant coronary artery bypass grafting would be somewhat elevated by the patient's associated comorbid medical diseases, but still relatively low. Given the potential benefits of multivessel surgical revascularization in this patient I favor conventional surgical aortic valve replacement with coronary artery bypass grafting. However, the terminal portion of the left anterior descending coronary artery may or may not be suitable target vessel for grafting at the time of surgery and the discrete stenosis in the left circumflex coronary system could easily be treated with PCI and stenting.   CT angiography reveals normal-sized thoracic aorta although formal imaging of the aortic root with cardiac gating is not currently available for review.  The radiologist who reviewed the patient's scan commented on the possibility that the patient may have a patent foramen ovale or small secundum type atrial septal defect.    Plan:  The patientand  his wife wereagain counseled regarding treatment alternatives for management of severe symptomatic aortic stenosis and multivessel coronary artery disease. Alternative approaches such as conventional aortic valve replacementwith coronary artery bypass grafing, transcatheter aortic valve replacementwith or without PCI and stenting, andcontinuedmedical therapy were compared and contrasted at length. The risks associated with conventional surgical aortic valve replacement were been discussed in detail, as were expectations for post-operative convalescence.   Discussion was held comparing the relative risks of mechanical valve replacement with need for lifelong anticoagulation versus use of a bioprosthetic tissue valve and the associated potential for late structural valve deterioration and failure.   This discussion was placed in the context of the patient's particular circumstances, and as a result the patient specifically requests that their valve be replaced using a bioprosthetic tissue valve .  The potential advantages and disadvantages associated with use of a rapid-deployment bioprosthetic aortic valve were discussed, including the risks of paravalvular leak, need for permanent pacemaker placement, and expectations for long-term durability.  The possibility that the patient may have a patent foramen ovale or secundum type atrial septal defect was discussed.  We will plan to investigate this carefully using transesophageal echocardiogram at the time of surgery and proceed with surgical repair as indicated.  The patient understands and accepts all potential associated risks of surgery including but not limited to risk of death, stroke, myocardial infarction, congestive heart failure, respiratory failure, renal failure, pneumonia, bleeding requiring blood transfusion and or reexploration, arrhythmia, heart block or bradycardia requiring permanent pacemaker, aortic dissection or other major vascular complication, pleural effusions or other delayed complications related to continued congestive heart failure, and other late complications related to valve replacement including structural valve deterioration and failure, thrombosis, endocarditis, or paravalvular leak.     Valentina Gu. Roxy Manns, MD 10/23/2017 3:43 PM

## 2017-10-25 MED ORDER — TRANEXAMIC ACID 1000 MG/10ML IV SOLN
1.5000 mg/kg/h | INTRAVENOUS | Status: AC
  Administered 2017-10-26: 1.5 mg/kg/h via INTRAVENOUS
  Filled 2017-10-25: qty 25

## 2017-10-25 MED ORDER — POTASSIUM CHLORIDE 2 MEQ/ML IV SOLN
80.0000 meq | INTRAVENOUS | Status: DC
  Filled 2017-10-25 (×2): qty 40

## 2017-10-25 MED ORDER — KENNESTONE BLOOD CARDIOPLEGIA VIAL
13.0000 mL | Freq: Once | Status: DC
  Filled 2017-10-25 (×2): qty 13

## 2017-10-25 MED ORDER — KENNESTONE BLOOD CARDIOPLEGIA (KBC) MANNITOL SYRINGE (20%, 32ML)
32.0000 mL | Freq: Once | INTRAVENOUS | Status: DC
  Filled 2017-10-25 (×2): qty 32

## 2017-10-25 MED ORDER — SODIUM CHLORIDE 0.9 % IV SOLN
INTRAVENOUS | Status: DC
  Filled 2017-10-25: qty 30

## 2017-10-25 MED ORDER — VANCOMYCIN HCL 10 G IV SOLR
1500.0000 mg | INTRAVENOUS | Status: AC
  Administered 2017-10-26: 1500 mg via INTRAVENOUS
  Filled 2017-10-25: qty 1500

## 2017-10-25 MED ORDER — DEXMEDETOMIDINE HCL IN NACL 400 MCG/100ML IV SOLN
0.1000 ug/kg/h | INTRAVENOUS | Status: DC
  Filled 2017-10-25: qty 100

## 2017-10-25 MED ORDER — DOPAMINE-DEXTROSE 3.2-5 MG/ML-% IV SOLN
0.0000 ug/kg/min | INTRAVENOUS | Status: DC
  Filled 2017-10-25: qty 250

## 2017-10-25 MED ORDER — EPINEPHRINE PF 1 MG/ML IJ SOLN
0.0000 ug/min | INTRAVENOUS | Status: DC
  Filled 2017-10-25: qty 4

## 2017-10-25 MED ORDER — DEXTROSE 5 % IV SOLN
750.0000 mg | INTRAVENOUS | Status: DC
  Filled 2017-10-25: qty 750

## 2017-10-25 MED ORDER — DEXTROSE 5 % IV SOLN
1.5000 g | INTRAVENOUS | Status: AC
  Administered 2017-10-26: .75 g via INTRAVENOUS
  Administered 2017-10-26: 1.5 g via INTRAVENOUS
  Filled 2017-10-25 (×2): qty 1.5

## 2017-10-25 MED ORDER — NITROGLYCERIN IN D5W 200-5 MCG/ML-% IV SOLN
2.0000 ug/min | INTRAVENOUS | Status: DC
  Filled 2017-10-25: qty 250

## 2017-10-25 MED ORDER — VANCOMYCIN HCL 1000 MG IV SOLR
INTRAVENOUS | Status: AC
  Administered 2017-10-26: 1000 mL
  Filled 2017-10-25: qty 1000

## 2017-10-25 MED ORDER — PHENYLEPHRINE HCL 10 MG/ML IJ SOLN
30.0000 ug/min | INTRAMUSCULAR | Status: DC
  Filled 2017-10-25: qty 2

## 2017-10-25 MED ORDER — TRANEXAMIC ACID (OHS) PUMP PRIME SOLUTION
2.0000 mg/kg | INTRAVENOUS | Status: DC
  Filled 2017-10-25: qty 2.16

## 2017-10-25 MED ORDER — MAGNESIUM SULFATE 50 % IJ SOLN
40.0000 meq | INTRAMUSCULAR | Status: DC
  Filled 2017-10-25: qty 9.85

## 2017-10-25 MED ORDER — SODIUM CHLORIDE 0.9 % IV SOLN
INTRAVENOUS | Status: AC
  Administered 2017-10-26: 1 [IU]/h via INTRAVENOUS
  Filled 2017-10-25: qty 1

## 2017-10-25 MED ORDER — TRANEXAMIC ACID (OHS) BOLUS VIA INFUSION
15.0000 mg/kg | INTRAVENOUS | Status: AC
Start: 2017-10-26 — End: 2017-10-26
  Administered 2017-10-26: 1621.5 mg via INTRAVENOUS

## 2017-10-25 MED ORDER — PLASMA-LYTE 148 IV SOLN
INTRAVENOUS | Status: AC
  Administered 2017-10-26: 500 mL
  Filled 2017-10-25: qty 2.5

## 2017-10-26 ENCOUNTER — Encounter (HOSPITAL_COMMUNITY): Payer: Self-pay | Admitting: *Deleted

## 2017-10-26 ENCOUNTER — Inpatient Hospital Stay (HOSPITAL_COMMUNITY): Payer: PPO | Admitting: Vascular Surgery

## 2017-10-26 ENCOUNTER — Inpatient Hospital Stay (HOSPITAL_COMMUNITY): Payer: PPO

## 2017-10-26 ENCOUNTER — Inpatient Hospital Stay (HOSPITAL_COMMUNITY)
Admission: RE | Admit: 2017-10-26 | Discharge: 2017-11-01 | DRG: 220 | Disposition: A | Discharge status: Home / Self Care | Payer: PPO | Source: Ambulatory Visit | Attending: Thoracic Surgery (Cardiothoracic Vascular Surgery) | Admitting: Thoracic Surgery (Cardiothoracic Vascular Surgery)

## 2017-10-26 ENCOUNTER — Encounter (HOSPITAL_COMMUNITY)
Admission: RE | Disposition: A | Discharge status: Home / Self Care | Payer: Self-pay | Source: Ambulatory Visit | Attending: Thoracic Surgery (Cardiothoracic Vascular Surgery)

## 2017-10-26 ENCOUNTER — Inpatient Hospital Stay (HOSPITAL_COMMUNITY): Payer: PPO | Admitting: Certified Registered Nurse Anesthetist

## 2017-10-26 DIAGNOSIS — E8770 Fluid overload, unspecified: Secondary | ICD-10-CM | POA: Diagnosis not present

## 2017-10-26 DIAGNOSIS — K219 Gastro-esophageal reflux disease without esophagitis: Secondary | ICD-10-CM | POA: Diagnosis present

## 2017-10-26 DIAGNOSIS — I2510 Atherosclerotic heart disease of native coronary artery without angina pectoris: Secondary | ICD-10-CM | POA: Diagnosis present

## 2017-10-26 DIAGNOSIS — Z7982 Long term (current) use of aspirin: Secondary | ICD-10-CM

## 2017-10-26 DIAGNOSIS — E118 Type 2 diabetes mellitus with unspecified complications: Secondary | ICD-10-CM | POA: Diagnosis present

## 2017-10-26 DIAGNOSIS — D696 Thrombocytopenia, unspecified: Secondary | ICD-10-CM | POA: Diagnosis not present

## 2017-10-26 DIAGNOSIS — E1151 Type 2 diabetes mellitus with diabetic peripheral angiopathy without gangrene: Secondary | ICD-10-CM | POA: Diagnosis present

## 2017-10-26 DIAGNOSIS — I350 Nonrheumatic aortic (valve) stenosis: Principal | ICD-10-CM

## 2017-10-26 DIAGNOSIS — R110 Nausea: Secondary | ICD-10-CM | POA: Diagnosis not present

## 2017-10-26 DIAGNOSIS — Z951 Presence of aortocoronary bypass graft: Secondary | ICD-10-CM

## 2017-10-26 DIAGNOSIS — D62 Acute posthemorrhagic anemia: Secondary | ICD-10-CM | POA: Diagnosis not present

## 2017-10-26 DIAGNOSIS — I25110 Atherosclerotic heart disease of native coronary artery with unstable angina pectoris: Secondary | ICD-10-CM | POA: Diagnosis present

## 2017-10-26 DIAGNOSIS — Z7951 Long term (current) use of inhaled steroids: Secondary | ICD-10-CM

## 2017-10-26 DIAGNOSIS — R000 Tachycardia, unspecified: Secondary | ICD-10-CM | POA: Diagnosis present

## 2017-10-26 DIAGNOSIS — I440 Atrioventricular block, first degree: Secondary | ICD-10-CM | POA: Diagnosis not present

## 2017-10-26 DIAGNOSIS — Z7984 Long term (current) use of oral hypoglycemic drugs: Secondary | ICD-10-CM | POA: Diagnosis not present

## 2017-10-26 DIAGNOSIS — G4733 Obstructive sleep apnea (adult) (pediatric): Secondary | ICD-10-CM | POA: Diagnosis present

## 2017-10-26 DIAGNOSIS — J9811 Atelectasis: Secondary | ICD-10-CM

## 2017-10-26 DIAGNOSIS — Z87891 Personal history of nicotine dependence: Secondary | ICD-10-CM | POA: Diagnosis not present

## 2017-10-26 DIAGNOSIS — I10 Essential (primary) hypertension: Secondary | ICD-10-CM | POA: Diagnosis present

## 2017-10-26 DIAGNOSIS — E785 Hyperlipidemia, unspecified: Secondary | ICD-10-CM | POA: Diagnosis present

## 2017-10-26 DIAGNOSIS — Z79899 Other long term (current) drug therapy: Secondary | ICD-10-CM

## 2017-10-26 DIAGNOSIS — Z6834 Body mass index (BMI) 34.0-34.9, adult: Secondary | ICD-10-CM | POA: Diagnosis not present

## 2017-10-26 DIAGNOSIS — Z952 Presence of prosthetic heart valve: Secondary | ICD-10-CM

## 2017-10-26 DIAGNOSIS — I358 Other nonrheumatic aortic valve disorders: Secondary | ICD-10-CM | POA: Diagnosis not present

## 2017-10-26 DIAGNOSIS — R079 Chest pain, unspecified: Secondary | ICD-10-CM | POA: Diagnosis present

## 2017-10-26 DIAGNOSIS — Z953 Presence of xenogenic heart valve: Secondary | ICD-10-CM

## 2017-10-26 HISTORY — PX: AORTIC VALVE REPLACEMENT: SHX41

## 2017-10-26 HISTORY — PX: CORONARY ARTERY BYPASS GRAFT: SHX141

## 2017-10-26 HISTORY — DX: Presence of xenogenic heart valve: Z95.3

## 2017-10-26 HISTORY — PX: TEE WITHOUT CARDIOVERSION: SHX5443

## 2017-10-26 LAB — POCT I-STAT, CHEM 8
BUN: 17 mg/dL (ref 6–20)
BUN: 15 mg/dL (ref 6–20)
BUN: 17 mg/dL (ref 6–20)
BUN: 16 mg/dL (ref 6–20)
BUN: 16 mg/dL (ref 6–20)
BUN: 16 mg/dL (ref 6–20)
BUN: 15 mg/dL (ref 6–20)
BUN: 16 mg/dL (ref 6–20)
BUN: 14 mg/dL (ref 6–20)
Calcium, Ion: 1.31 mmol/L (ref 1.15–1.40)
Calcium, Ion: 1.04 mmol/L — ABNORMAL LOW (ref 1.15–1.40)
Calcium, Ion: 1.3 mmol/L (ref 1.15–1.40)
Calcium, Ion: 1.07 mmol/L — ABNORMAL LOW (ref 1.15–1.40)
Calcium, Ion: 1.07 mmol/L — ABNORMAL LOW (ref 1.15–1.40)
Calcium, Ion: 1.07 mmol/L — ABNORMAL LOW (ref 1.15–1.40)
Calcium, Ion: 1.02 mmol/L — ABNORMAL LOW (ref 1.15–1.40)
Calcium, Ion: 1.03 mmol/L — ABNORMAL LOW (ref 1.15–1.40)
Calcium, Ion: 1.18 mmol/L (ref 1.15–1.40)
Chloride: 102 mmol/L (ref 101–111)
Chloride: 100 mmol/L — ABNORMAL LOW (ref 101–111)
Chloride: 102 mmol/L (ref 101–111)
Chloride: 101 mmol/L (ref 101–111)
Chloride: 100 mmol/L — ABNORMAL LOW (ref 101–111)
Chloride: 101 mmol/L (ref 101–111)
Chloride: 101 mmol/L (ref 101–111)
Chloride: 102 mmol/L (ref 101–111)
Chloride: 107 mmol/L (ref 101–111)
Creatinine, Ser: 1 mg/dL (ref 0.61–1.24)
Creatinine, Ser: 0.8 mg/dL (ref 0.61–1.24)
Creatinine, Ser: 0.9 mg/dL (ref 0.61–1.24)
Creatinine, Ser: 0.8 mg/dL (ref 0.61–1.24)
Creatinine, Ser: 0.8 mg/dL (ref 0.61–1.24)
Creatinine, Ser: 0.8 mg/dL (ref 0.61–1.24)
Creatinine, Ser: 0.7 mg/dL (ref 0.61–1.24)
Creatinine, Ser: 0.8 mg/dL (ref 0.61–1.24)
Creatinine, Ser: 0.8 mg/dL (ref 0.61–1.24)
Glucose, Bld: 170 mg/dL — ABNORMAL HIGH (ref 65–99)
Glucose, Bld: 148 mg/dL — ABNORMAL HIGH (ref 65–99)
Glucose, Bld: 164 mg/dL — ABNORMAL HIGH (ref 65–99)
Glucose, Bld: 172 mg/dL — ABNORMAL HIGH (ref 65–99)
Glucose, Bld: 181 mg/dL — ABNORMAL HIGH (ref 65–99)
Glucose, Bld: 186 mg/dL — ABNORMAL HIGH (ref 65–99)
Glucose, Bld: 174 mg/dL — ABNORMAL HIGH (ref 65–99)
Glucose, Bld: 191 mg/dL — ABNORMAL HIGH (ref 65–99)
Glucose, Bld: 143 mg/dL — ABNORMAL HIGH (ref 65–99)
HCT: 36 % — ABNORMAL LOW (ref 39.0–52.0)
HCT: 32 % — ABNORMAL LOW (ref 39.0–52.0)
HCT: 34 % — ABNORMAL LOW (ref 39.0–52.0)
HCT: 28 % — ABNORMAL LOW (ref 39.0–52.0)
HCT: 29 % — ABNORMAL LOW (ref 39.0–52.0)
HCT: 29 % — ABNORMAL LOW (ref 39.0–52.0)
HCT: 28 % — ABNORMAL LOW (ref 39.0–52.0)
HCT: 29 % — ABNORMAL LOW (ref 39.0–52.0)
HCT: 29 % — ABNORMAL LOW (ref 39.0–52.0)
Hemoglobin: 12.2 g/dL — ABNORMAL LOW (ref 13.0–17.0)
Hemoglobin: 10.9 g/dL — ABNORMAL LOW (ref 13.0–17.0)
Hemoglobin: 11.6 g/dL — ABNORMAL LOW (ref 13.0–17.0)
Hemoglobin: 9.5 g/dL — ABNORMAL LOW (ref 13.0–17.0)
Hemoglobin: 9.9 g/dL — ABNORMAL LOW (ref 13.0–17.0)
Hemoglobin: 9.9 g/dL — ABNORMAL LOW (ref 13.0–17.0)
Hemoglobin: 9.5 g/dL — ABNORMAL LOW (ref 13.0–17.0)
Hemoglobin: 9.9 g/dL — ABNORMAL LOW (ref 13.0–17.0)
Hemoglobin: 9.9 g/dL — ABNORMAL LOW (ref 13.0–17.0)
Potassium: 3.9 mmol/L (ref 3.5–5.1)
Potassium: 4.3 mmol/L (ref 3.5–5.1)
Potassium: 4.5 mmol/L (ref 3.5–5.1)
Potassium: 5 mmol/L (ref 3.5–5.1)
Potassium: 5 mmol/L (ref 3.5–5.1)
Potassium: 5.1 mmol/L (ref 3.5–5.1)
Potassium: 3.8 mmol/L (ref 3.5–5.1)
Potassium: 4.8 mmol/L (ref 3.5–5.1)
Potassium: 4.3 mmol/L (ref 3.5–5.1)
Sodium: 139 mmol/L (ref 135–145)
Sodium: 138 mmol/L (ref 135–145)
Sodium: 138 mmol/L (ref 135–145)
Sodium: 134 mmol/L — ABNORMAL LOW (ref 135–145)
Sodium: 133 mmol/L — ABNORMAL LOW (ref 135–145)
Sodium: 133 mmol/L — ABNORMAL LOW (ref 135–145)
Sodium: 133 mmol/L — ABNORMAL LOW (ref 135–145)
Sodium: 135 mmol/L (ref 135–145)
Sodium: 140 mmol/L (ref 135–145)
TCO2: 24 mmol/L (ref 22–32)
TCO2: 25 mmol/L (ref 22–32)
TCO2: 25 mmol/L (ref 22–32)
TCO2: 24 mmol/L (ref 22–32)
TCO2: 23 mmol/L (ref 22–32)
TCO2: 24 mmol/L (ref 22–32)
TCO2: 22 mmol/L (ref 22–32)
TCO2: 23 mmol/L (ref 22–32)
TCO2: 22 mmol/L (ref 22–32)

## 2017-10-26 LAB — POCT I-STAT 3, ART BLOOD GAS (G3+)
Acid-Base Excess: 2 mmol/L (ref 0.0–2.0)
Acid-base deficit: 4 mmol/L — ABNORMAL HIGH (ref 0.0–2.0)
Acid-base deficit: 4 mmol/L — ABNORMAL HIGH (ref 0.0–2.0)
Acid-base deficit: 7 mmol/L — ABNORMAL HIGH (ref 0.0–2.0)
Acid-base deficit: 5 mmol/L — ABNORMAL HIGH (ref 0.0–2.0)
Acid-base deficit: 5 mmol/L — ABNORMAL HIGH (ref 0.0–2.0)
Bicarbonate: 28 mmol/L (ref 20.0–28.0)
Bicarbonate: 20.8 mmol/L (ref 20.0–28.0)
Bicarbonate: 22.5 mmol/L (ref 20.0–28.0)
Bicarbonate: 18.8 mmol/L — ABNORMAL LOW (ref 20.0–28.0)
Bicarbonate: 20.6 mmol/L (ref 20.0–28.0)
Bicarbonate: 21.1 mmol/L (ref 20.0–28.0)
O2 Saturation: 100 %
O2 Saturation: 100 %
O2 Saturation: 99 %
O2 Saturation: 97 %
O2 Saturation: 97 %
O2 Saturation: 97 %
Patient temperature: 36.4
Patient temperature: 37.2
Patient temperature: 37.8
Patient temperature: 37.9
TCO2: 29 mmol/L (ref 22–32)
TCO2: 22 mmol/L (ref 22–32)
TCO2: 24 mmol/L (ref 22–32)
TCO2: 20 mmol/L — ABNORMAL LOW (ref 22–32)
TCO2: 22 mmol/L (ref 22–32)
TCO2: 22 mmol/L (ref 22–32)
pCO2 arterial: 50.1 mmHg — ABNORMAL HIGH (ref 32.0–48.0)
pCO2 arterial: 35.2 mmHg (ref 32.0–48.0)
pCO2 arterial: 43.3 mmHg (ref 32.0–48.0)
pCO2 arterial: 38.8 mmHg (ref 32.0–48.0)
pCO2 arterial: 40.3 mmHg (ref 32.0–48.0)
pCO2 arterial: 42.6 mmHg (ref 32.0–48.0)
pH, Arterial: 7.355 (ref 7.350–7.450)
pH, Arterial: 7.38 (ref 7.350–7.450)
pH, Arterial: 7.321 — ABNORMAL LOW (ref 7.350–7.450)
pH, Arterial: 7.293 — ABNORMAL LOW (ref 7.350–7.450)
pH, Arterial: 7.32 — ABNORMAL LOW (ref 7.350–7.450)
pH, Arterial: 7.308 — ABNORMAL LOW (ref 7.350–7.450)
pO2, Arterial: 331 mmHg — ABNORMAL HIGH (ref 83.0–108.0)
pO2, Arterial: 237 mmHg — ABNORMAL HIGH (ref 83.0–108.0)
pO2, Arterial: 146 mmHg — ABNORMAL HIGH (ref 83.0–108.0)
pO2, Arterial: 106 mmHg (ref 83.0–108.0)
pO2, Arterial: 107 mmHg (ref 83.0–108.0)
pO2, Arterial: 99 mmHg (ref 83.0–108.0)

## 2017-10-26 LAB — CBC
HCT: 34 % — ABNORMAL LOW (ref 39.0–52.0)
HCT: 30.9 % — ABNORMAL LOW (ref 39.0–52.0)
Hemoglobin: 11.3 g/dL — ABNORMAL LOW (ref 13.0–17.0)
Hemoglobin: 10.3 g/dL — ABNORMAL LOW (ref 13.0–17.0)
MCH: 31.4 pg (ref 26.0–34.0)
MCH: 32 pg (ref 26.0–34.0)
MCHC: 33.2 g/dL (ref 30.0–36.0)
MCHC: 33.3 g/dL (ref 30.0–36.0)
MCV: 94.4 fL (ref 78.0–100.0)
MCV: 96 fL (ref 78.0–100.0)
Platelets: 128 10*3/uL — ABNORMAL LOW (ref 150–400)
Platelets: 133 10*3/uL — ABNORMAL LOW (ref 150–400)
RBC: 3.6 MIL/uL — ABNORMAL LOW (ref 4.22–5.81)
RBC: 3.22 MIL/uL — ABNORMAL LOW (ref 4.22–5.81)
RDW: 13.2 % (ref 11.5–15.5)
RDW: 13.7 % (ref 11.5–15.5)
WBC: 25 10*3/uL — ABNORMAL HIGH (ref 4.0–10.5)
WBC: 21.6 10*3/uL — ABNORMAL HIGH (ref 4.0–10.5)

## 2017-10-26 LAB — CREATININE, SERUM
Creatinine, Ser: 0.98 mg/dL (ref 0.61–1.24)
GFR calc Af Amer: >60 mL/min (ref >60)
GFR calc non Af Amer: >60 mL/min (ref >60)

## 2017-10-26 LAB — APTT: aPTT: 36 seconds (ref 24–36)

## 2017-10-26 LAB — GLUCOSE, CAPILLARY
Glucose-Capillary: 112 mg/dL — ABNORMAL HIGH (ref 65–99)
Glucose-Capillary: 112 mg/dL — ABNORMAL HIGH (ref 65–99)
Glucose-Capillary: 118 mg/dL — ABNORMAL HIGH (ref 65–99)
Glucose-Capillary: 124 mg/dL — ABNORMAL HIGH (ref 65–99)
Glucose-Capillary: 128 mg/dL — ABNORMAL HIGH (ref 65–99)
Glucose-Capillary: 135 mg/dL — ABNORMAL HIGH (ref 65–99)
Glucose-Capillary: 135 mg/dL — ABNORMAL HIGH (ref 65–99)
Glucose-Capillary: 136 mg/dL — ABNORMAL HIGH (ref 65–99)
Glucose-Capillary: 144 mg/dL — ABNORMAL HIGH (ref 65–99)
Glucose-Capillary: 158 mg/dL — ABNORMAL HIGH (ref 65–99)
Glucose-Capillary: 99 mg/dL (ref 65–99)

## 2017-10-26 LAB — MAGNESIUM: Magnesium: 2.6 mg/dL — ABNORMAL HIGH (ref 1.7–2.4)

## 2017-10-26 LAB — PROTIME-INR
INR: 1.49
Prothrombin Time: 17.9 seconds — ABNORMAL HIGH (ref 11.4–15.2)

## 2017-10-26 LAB — PLATELET COUNT: Platelets: 136 10*3/uL — ABNORMAL LOW (ref 150–400)

## 2017-10-26 LAB — HEMOGLOBIN AND HEMATOCRIT, BLOOD
HCT: 29.4 % — ABNORMAL LOW (ref 39.0–52.0)
Hemoglobin: 10.1 g/dL — ABNORMAL LOW (ref 13.0–17.0)

## 2017-10-26 SURGERY — REPLACEMENT, AORTIC VALVE, OPEN
Anesthesia: General | Site: Chest

## 2017-10-26 MED ORDER — PHENYLEPHRINE HCL 10 MG/ML IJ SOLN
0.0000 ug/min | INTRAMUSCULAR | Status: DC
  Administered 2017-10-26: 20 ug/min via INTRAVENOUS
  Filled 2017-10-26 (×3): qty 2

## 2017-10-26 MED ORDER — SODIUM CHLORIDE 0.9 % IJ SOLN
INTRAMUSCULAR | Status: DC | PRN
  Administered 2017-10-26 (×5): 4 mL via TOPICAL

## 2017-10-26 MED ORDER — CHLORHEXIDINE GLUCONATE 0.12 % MT SOLN
OROMUCOSAL | Status: AC
  Filled 2017-10-26: qty 15

## 2017-10-26 MED ORDER — ACETAMINOPHEN 160 MG/5ML PO SOLN: 650.0000 mg | Freq: Once | ORAL | Status: AC

## 2017-10-26 MED ORDER — SODIUM CHLORIDE 0.9 % IV SOLN
INTRAVENOUS | Status: DC | PRN
  Administered 2017-10-26: 0.2 ug/kg/h via INTRAVENOUS

## 2017-10-26 MED ORDER — CHLORHEXIDINE GLUCONATE 0.12 % MT SOLN
15.0000 mL | OROMUCOSAL | Status: AC
  Administered 2017-10-26: 15 mL via OROMUCOSAL

## 2017-10-26 MED ORDER — ONDANSETRON HCL 4 MG/2ML IJ SOLN
4.0000 mg | Freq: Four times a day (QID) | INTRAMUSCULAR | Status: DC | PRN
  Administered 2017-10-26 – 2017-10-29 (×5): 4 mg via INTRAVENOUS
  Filled 2017-10-26 (×5): qty 2

## 2017-10-26 MED ORDER — MORPHINE SULFATE (PF) 4 MG/ML IV SOLN: 1.0000 mg | INTRAVENOUS | Status: DC | PRN

## 2017-10-26 MED ORDER — MIDAZOLAM HCL 2 MG/2ML IJ SOLN
INTRAMUSCULAR | Status: AC
  Filled 2017-10-26: qty 4

## 2017-10-26 MED ORDER — POTASSIUM CHLORIDE 10 MEQ/50ML IV SOLN
10.0000 meq | INTRAVENOUS | Status: AC
  Administered 2017-10-26 (×3): 10 meq via INTRAVENOUS

## 2017-10-26 MED ORDER — TRAMADOL HCL 50 MG PO TABS
50.0000 mg | ORAL_TABLET | ORAL | Status: DC | PRN
  Administered 2017-10-27 – 2017-10-28 (×4): 100 mg via ORAL
  Administered 2017-10-29 – 2017-10-31 (×3): 50 mg via ORAL
  Filled 2017-10-26 (×3): qty 2
  Filled 2017-10-26 (×2): qty 1
  Filled 2017-10-26: qty 2
  Filled 2017-10-26: qty 1

## 2017-10-26 MED ORDER — DEXMEDETOMIDINE HCL IN NACL 200 MCG/50ML IV SOLN
INTRAVENOUS | Status: AC
  Filled 2017-10-26: qty 100

## 2017-10-26 MED ORDER — ALBUMIN HUMAN 5 % IV SOLN
250.0000 mL | INTRAVENOUS | Status: AC | PRN
  Administered 2017-10-26 (×4): 250 mL via INTRAVENOUS
  Filled 2017-10-26 (×2): qty 250

## 2017-10-26 MED ORDER — LACTATED RINGERS IV SOLN
INTRAVENOUS | Status: DC | PRN
  Administered 2017-10-26 (×2): via INTRAVENOUS

## 2017-10-26 MED ORDER — LACTATED RINGERS IV SOLN: INTRAVENOUS | Status: DC

## 2017-10-26 MED ORDER — LACTATED RINGERS IV SOLN
INTRAVENOUS | Status: DC | PRN
  Administered 2017-10-26 (×2): via INTRAVENOUS

## 2017-10-26 MED ORDER — TRANEXAMIC ACID 1000 MG/10ML IV SOLN
1.5000 mg/kg/h | INTRAVENOUS | Status: DC
  Filled 2017-10-26 (×2): qty 10

## 2017-10-26 MED ORDER — PROPOFOL 500 MG/50ML IV EMUL
INTRAVENOUS | Status: DC | PRN
  Administered 2017-10-26: 100 ug/kg/min via INTRAVENOUS

## 2017-10-26 MED ORDER — SODIUM BICARBONATE 8.4 % IV SOLN
50.0000 meq | Freq: Once | INTRAVENOUS | Status: AC
  Administered 2017-10-26: 50 meq via INTRAVENOUS

## 2017-10-26 MED ORDER — PROTAMINE SULFATE 10 MG/ML IV SOLN
INTRAVENOUS | Status: DC | PRN
  Administered 2017-10-26 (×7): 50 mg via INTRAVENOUS
  Administered 2017-10-26: 30 mg via INTRAVENOUS

## 2017-10-26 MED ORDER — MORPHINE SULFATE (PF) 4 MG/ML IV SOLN
1.0000 mg | INTRAVENOUS | Status: DC | PRN
Start: 2017-10-26 — End: 2017-10-29
  Administered 2017-10-27 (×2): 2 mg via INTRAVENOUS
  Filled 2017-10-26 (×2): qty 1

## 2017-10-26 MED ORDER — METOPROLOL TARTRATE 5 MG/5ML IV SOLN: 2.5000 mg | INTRAVENOUS | Status: DC | PRN

## 2017-10-26 MED ORDER — ROCURONIUM BROMIDE 100 MG/10ML IV SOLN
INTRAVENOUS | Status: DC | PRN
  Administered 2017-10-26 (×2): 50 mg via INTRAVENOUS
  Administered 2017-10-26: 100 mg via INTRAVENOUS
  Administered 2017-10-26: 50 mg via INTRAVENOUS

## 2017-10-26 MED ORDER — SODIUM CHLORIDE 0.45 % IV SOLN: INTRAVENOUS | Status: DC | PRN

## 2017-10-26 MED ORDER — CHLORHEXIDINE GLUCONATE 4 % EX LIQD: 30.0000 mL | CUTANEOUS | Status: DC

## 2017-10-26 MED ORDER — ACETAMINOPHEN 500 MG PO TABS
1000.0000 mg | ORAL_TABLET | Freq: Four times a day (QID) | ORAL | Status: AC
  Administered 2017-10-28 – 2017-10-31 (×14): 1000 mg via ORAL
  Filled 2017-10-26 (×16): qty 2

## 2017-10-26 MED ORDER — ACETAMINOPHEN 650 MG RE SUPP
650.0000 mg | Freq: Once | RECTAL | Status: AC
  Administered 2017-10-26: 650 mg via RECTAL

## 2017-10-26 MED ORDER — METOPROLOL TARTRATE 25 MG/10 ML ORAL SUSPENSION: 12.5000 mg | Freq: Two times a day (BID) | ORAL | Status: DC

## 2017-10-26 MED ORDER — DOCUSATE SODIUM 100 MG PO CAPS
200.0000 mg | ORAL_CAPSULE | Freq: Every day | ORAL | Status: DC
  Administered 2017-10-27 – 2017-10-29 (×3): 200 mg via ORAL
  Filled 2017-10-26 (×3): qty 2

## 2017-10-26 MED ORDER — VANCOMYCIN HCL IN DEXTROSE 1-5 GM/200ML-% IV SOLN
1000.0000 mg | Freq: Once | INTRAVENOUS | Status: AC
  Administered 2017-10-26: 1000 mg via INTRAVENOUS
  Filled 2017-10-26: qty 200

## 2017-10-26 MED ORDER — ROCURONIUM BROMIDE 10 MG/ML (PF) SYRINGE
PREFILLED_SYRINGE | INTRAVENOUS | Status: AC
  Filled 2017-10-26: qty 5

## 2017-10-26 MED ORDER — FAMOTIDINE IN NACL 20-0.9 MG/50ML-% IV SOLN
20.0000 mg | Freq: Two times a day (BID) | INTRAVENOUS | Status: AC
  Administered 2017-10-26 (×2): 20 mg via INTRAVENOUS
  Filled 2017-10-26: qty 50

## 2017-10-26 MED ORDER — INSULIN REGULAR HUMAN 100 UNIT/ML IJ SOLN: INTRAMUSCULAR | Status: DC | PRN

## 2017-10-26 MED ORDER — BISACODYL 5 MG PO TBEC
10.0000 mg | DELAYED_RELEASE_TABLET | Freq: Every day | ORAL | Status: DC
  Administered 2017-10-27 – 2017-10-29 (×3): 10 mg via ORAL
  Filled 2017-10-26 (×3): qty 2

## 2017-10-26 MED ORDER — MIDAZOLAM HCL 5 MG/5ML IJ SOLN
INTRAMUSCULAR | Status: DC | PRN
  Administered 2017-10-26 (×3): 2 mg via INTRAVENOUS
  Administered 2017-10-26: 1 mg via INTRAVENOUS
  Administered 2017-10-26: 4 mg via INTRAVENOUS
  Administered 2017-10-26: 2 mg via INTRAVENOUS
  Administered 2017-10-26: 1 mg via INTRAVENOUS

## 2017-10-26 MED ORDER — ASPIRIN 81 MG PO CHEW: 324.0000 mg | CHEWABLE_TABLET | Freq: Every day | ORAL | Status: DC

## 2017-10-26 MED ORDER — NITROGLYCERIN IN D5W 200-5 MCG/ML-% IV SOLN
INTRAVENOUS | Status: DC | PRN
  Administered 2017-10-26: 16.6 ug/min via INTRAVENOUS

## 2017-10-26 MED ORDER — METOPROLOL TARTRATE 12.5 MG HALF TABLET: 12.5000 mg | ORAL_TABLET | Freq: Once | ORAL | Status: DC

## 2017-10-26 MED ORDER — HEPARIN SODIUM (PORCINE) 1000 UNIT/ML IJ SOLN
INTRAMUSCULAR | Status: AC
  Filled 2017-10-26: qty 1

## 2017-10-26 MED ORDER — METOPROLOL TARTRATE 12.5 MG HALF TABLET
12.5000 mg | ORAL_TABLET | Freq: Two times a day (BID) | ORAL | Status: DC
  Administered 2017-10-27 – 2017-10-29 (×6): 12.5 mg via ORAL
  Filled 2017-10-26 (×6): qty 1

## 2017-10-26 MED ORDER — FENTANYL CITRATE (PF) 250 MCG/5ML IJ SOLN
INTRAMUSCULAR | Status: DC | PRN
  Administered 2017-10-26: 50 ug via INTRAVENOUS
  Administered 2017-10-26: 100 ug via INTRAVENOUS
  Administered 2017-10-26: 250 ug via INTRAVENOUS
  Administered 2017-10-26: 100 ug via INTRAVENOUS
  Administered 2017-10-26: 150 ug via INTRAVENOUS
  Administered 2017-10-26: 350 ug via INTRAVENOUS
  Administered 2017-10-26: 250 ug via INTRAVENOUS

## 2017-10-26 MED ORDER — LACTATED RINGERS IV SOLN
500.0000 mL | Freq: Once | INTRAVENOUS | Status: AC | PRN
  Administered 2017-10-26: 500 mL via INTRAVENOUS

## 2017-10-26 MED ORDER — INSULIN REGULAR BOLUS VIA INFUSION
0.0000 [IU] | Freq: Three times a day (TID) | INTRAVENOUS | Status: DC
  Filled 2017-10-26: qty 10

## 2017-10-26 MED ORDER — CHLORHEXIDINE GLUCONATE 0.12% ORAL RINSE (MEDLINE KIT)
15.0000 mL | Freq: Two times a day (BID) | OROMUCOSAL | Status: DC
  Administered 2017-10-26 – 2017-10-28 (×4): 15 mL via OROMUCOSAL

## 2017-10-26 MED ORDER — ORAL CARE MOUTH RINSE
15.0000 mL | Freq: Two times a day (BID) | OROMUCOSAL | Status: DC
  Administered 2017-10-26 – 2017-10-30 (×6): 15 mL via OROMUCOSAL

## 2017-10-26 MED ORDER — MAGNESIUM SULFATE 4 GM/100ML IV SOLN
4.0000 g | Freq: Once | INTRAVENOUS | Status: AC
  Administered 2017-10-26: 4 g via INTRAVENOUS

## 2017-10-26 MED ORDER — SODIUM CHLORIDE 0.9% FLUSH: 3.0000 mL | INTRAVENOUS | Status: DC | PRN

## 2017-10-26 MED ORDER — PANTOPRAZOLE SODIUM 40 MG PO TBEC
40.0000 mg | DELAYED_RELEASE_TABLET | Freq: Every day | ORAL | Status: DC
  Administered 2017-10-28 – 2017-11-01 (×5): 40 mg via ORAL
  Filled 2017-10-26 (×5): qty 1

## 2017-10-26 MED ORDER — ACETAMINOPHEN 160 MG/5ML PO SOLN: 1000.0000 mg | Freq: Four times a day (QID) | ORAL | Status: DC

## 2017-10-26 MED ORDER — CHLORHEXIDINE GLUCONATE 0.12 % MT SOLN
15.0000 mL | Freq: Once | OROMUCOSAL | Status: AC
  Administered 2017-10-26: 15 mL via OROMUCOSAL

## 2017-10-26 MED ORDER — SODIUM CHLORIDE 0.9 % IV SOLN: INTRAVENOUS | Status: DC

## 2017-10-26 MED ORDER — NITROGLYCERIN IN D5W 200-5 MCG/ML-% IV SOLN: 0.0000 ug/min | INTRAVENOUS | Status: DC

## 2017-10-26 MED ORDER — SODIUM CHLORIDE 0.9 % IV SOLN: 250.0000 mL | INTRAVENOUS | Status: DC

## 2017-10-26 MED ORDER — SODIUM CHLORIDE 0.9 % IV SOLN
0.0000 ug/kg/h | INTRAVENOUS | Status: DC
  Filled 2017-10-26: qty 2

## 2017-10-26 MED ORDER — BISACODYL 10 MG RE SUPP: 10.0000 mg | Freq: Every day | RECTAL | Status: DC

## 2017-10-26 MED ORDER — DEXTROSE 5 % IV SOLN
1.5000 g | Freq: Two times a day (BID) | INTRAVENOUS | Status: AC
  Administered 2017-10-26 – 2017-10-28 (×4): 1.5 g via INTRAVENOUS
  Filled 2017-10-26 (×4): qty 1.5

## 2017-10-26 MED ORDER — ALBUMIN HUMAN 5 % IV SOLN
250.0000 mL | INTRAVENOUS | Status: AC | PRN
  Administered 2017-10-27: 250 mL via INTRAVENOUS
  Filled 2017-10-26: qty 250

## 2017-10-26 MED ORDER — SODIUM CHLORIDE 0.9 % IR SOLN
Status: DC | PRN
  Administered 2017-10-26: 5000 mL

## 2017-10-26 MED ORDER — MIDAZOLAM HCL 10 MG/2ML IJ SOLN
INTRAMUSCULAR | Status: AC
  Filled 2017-10-26: qty 2

## 2017-10-26 MED ORDER — PHENYLEPHRINE HCL 10 MG/ML IJ SOLN
INTRAMUSCULAR | Status: DC | PRN
  Administered 2017-10-26: 20 ug/min via INTRAVENOUS

## 2017-10-26 MED ORDER — LACTATED RINGERS IV SOLN
INTRAVENOUS | Status: DC | PRN
  Administered 2017-10-26: 09:00:00 via INTRAVENOUS

## 2017-10-26 MED ORDER — OXYCODONE HCL 5 MG PO TABS: 5.0000 mg | ORAL_TABLET | ORAL | Status: DC | PRN

## 2017-10-26 MED ORDER — SUFENTANIL CITRATE 250 MCG/5ML IV SOLN
0.2500 ug/kg/h | INTRAVENOUS | Status: AC
  Administered 2017-10-26: .1 ug/kg/h via INTRAVENOUS
  Filled 2017-10-26: qty 5

## 2017-10-26 MED ORDER — ASPIRIN EC 325 MG PO TBEC
325.0000 mg | DELAYED_RELEASE_TABLET | Freq: Every day | ORAL | Status: DC
  Administered 2017-10-27 – 2017-10-29 (×3): 325 mg via ORAL
  Filled 2017-10-26 (×3): qty 1

## 2017-10-26 MED ORDER — FENTANYL CITRATE (PF) 250 MCG/5ML IJ SOLN
INTRAMUSCULAR | Status: AC
Start: 2017-10-26 — End: 2017-10-26
  Filled 2017-10-26: qty 25

## 2017-10-26 MED ORDER — ONDANSETRON HCL 4 MG/2ML IJ SOLN
INTRAMUSCULAR | Status: DC | PRN
  Administered 2017-10-26: 4 mg via INTRAVENOUS

## 2017-10-26 MED ORDER — INSULIN REGULAR HUMAN 100 UNIT/ML IJ SOLN
INTRAMUSCULAR | Status: DC
  Administered 2017-10-27: 4.8 [IU]/h via INTRAVENOUS
  Filled 2017-10-26 (×2): qty 1

## 2017-10-26 MED ORDER — PROPOFOL 10 MG/ML IV BOLUS
INTRAVENOUS | Status: AC
  Filled 2017-10-26: qty 20

## 2017-10-26 MED ORDER — SODIUM CHLORIDE 0.9% FLUSH
3.0000 mL | Freq: Two times a day (BID) | INTRAVENOUS | Status: DC
  Administered 2017-10-27 – 2017-10-29 (×5): 3 mL via INTRAVENOUS

## 2017-10-26 MED ORDER — HEPARIN SODIUM (PORCINE) 1000 UNIT/ML IJ SOLN
INTRAMUSCULAR | Status: DC | PRN
  Administered 2017-10-26: 38 mL via INTRAVENOUS

## 2017-10-26 MED ORDER — SODIUM CHLORIDE 0.9% FLUSH
10.0000 mL | Freq: Two times a day (BID) | INTRAVENOUS | Status: DC
  Administered 2017-10-26 – 2017-10-29 (×7): 10 mL

## 2017-10-26 MED ORDER — PROTAMINE SULFATE 10 MG/ML IV SOLN
INTRAVENOUS | Status: AC
  Filled 2017-10-26: qty 50

## 2017-10-26 MED ORDER — PROPOFOL 10 MG/ML IV BOLUS
INTRAVENOUS | Status: DC | PRN
  Administered 2017-10-26: 10 mg via INTRAVENOUS

## 2017-10-26 MED ORDER — SODIUM CHLORIDE 0.9 % IV SOLN
INTRAVENOUS | Status: DC
  Administered 2017-10-26: 15:00:00 via INTRAVENOUS

## 2017-10-26 MED ORDER — MIDAZOLAM HCL 2 MG/2ML IJ SOLN: 2.0000 mg | INTRAMUSCULAR | Status: DC | PRN

## 2017-10-26 MED ORDER — SODIUM CHLORIDE 0.9 % IV SOLN: INTRAVENOUS | Status: DC | PRN

## 2017-10-26 MED ORDER — ORAL CARE MOUTH RINSE
15.0000 mL | Freq: Four times a day (QID) | OROMUCOSAL | Status: DC
  Administered 2017-10-26: 15 mL via OROMUCOSAL

## 2017-10-26 MED ORDER — SODIUM CHLORIDE 0.9% FLUSH: 10.0000 mL | INTRAVENOUS | Status: DC | PRN

## 2017-10-26 MED ORDER — ALBUMIN HUMAN 5 % IV SOLN
INTRAVENOUS | Status: DC | PRN
  Administered 2017-10-26 (×2): via INTRAVENOUS

## 2017-10-26 MED FILL — Gelatin Absorbable MT Powder: OROMUCOSAL | Qty: 1 | Status: AC

## 2017-10-26 SURGICAL SUPPLY — 142 items
ADAPTER CARDIO PERF ANTE/RETRO (ADAPTER) ×3 IMPLANT
ADPR PRFSN 84XANTGRD RTRGD (ADAPTER) ×2
BAG DECANTER FOR FLEXI CONT (MISCELLANEOUS) ×6 IMPLANT
BANDAGE ACE 4X5 VEL STRL LF (GAUZE/BANDAGES/DRESSINGS) ×3 IMPLANT
BANDAGE ACE 6X5 VEL STRL LF (GAUZE/BANDAGES/DRESSINGS) ×3 IMPLANT
BASKET HEART (ORDER IN 25'S) (MISCELLANEOUS) ×1
BASKET HEART (ORDER IN 25S) (MISCELLANEOUS) ×2 IMPLANT
BLADE CLIPPER SURG (BLADE) ×1 IMPLANT
BLADE STERNUM SYSTEM 6 (BLADE) ×3 IMPLANT
BLADE SURG 11 STRL SS (BLADE) ×4 IMPLANT
BNDG GAUZE ELAST 4 BULKY (GAUZE/BANDAGES/DRESSINGS) ×3 IMPLANT
CANISTER SUCT 3000ML PPV (MISCELLANEOUS) ×3 IMPLANT
CANNULA EZ GLIDE AORTIC 21FR (CANNULA) ×6 IMPLANT
CANNULA GUNDRY RCSP 15FR (MISCELLANEOUS) ×3 IMPLANT
CANNULA SOFTFLOW AORTIC 7M21FR (CANNULA) ×3 IMPLANT
CATH CPB KIT OWEN (MISCELLANEOUS) ×3 IMPLANT
CATH HEART VENT LEFT (CATHETERS) ×2 IMPLANT
CATH THORACIC 36FR (CATHETERS) ×3 IMPLANT
CATH THORACIC 36FR RT ANG (CATHETERS) ×3 IMPLANT
CLIP RETRACTION 3.0MM CORONARY (MISCELLANEOUS) ×1 IMPLANT
CLIP VESOCCLUDE MED 24/CT (CLIP) IMPLANT
CLIP VESOCCLUDE SM WIDE 24/CT (CLIP) IMPLANT
CONN ST 1/4X3/8  BEN (MISCELLANEOUS) ×2
CONN ST 1/4X3/8 BEN (MISCELLANEOUS) IMPLANT
CONT SPEC 4OZ CLIKSEAL STRL BL (MISCELLANEOUS) ×1 IMPLANT
COUNTER NEEDLE 20 DBL MAG RED (NEEDLE) ×1 IMPLANT
COVER SURGICAL LIGHT HANDLE (MISCELLANEOUS) ×3 IMPLANT
CRADLE DONUT ADULT HEAD (MISCELLANEOUS) ×3 IMPLANT
DEVICE SUT CK QUICK LOAD MINI (Prosthesis & Implant Heart) ×2 IMPLANT
DRAIN CHANNEL 32F RND 10.7 FF (WOUND CARE) ×6 IMPLANT
DRAPE BILATERAL SPLIT (DRAPES) IMPLANT
DRAPE CARDIOVASCULAR INCISE (DRAPES) ×3
DRAPE CV SPLIT W-CLR ANES SCRN (DRAPES) IMPLANT
DRAPE INCISE IOBAN 66X45 STRL (DRAPES) ×6 IMPLANT
DRAPE SLUSH/WARMER DISC (DRAPES) ×3 IMPLANT
DRAPE SRG 135X102X78XABS (DRAPES) ×2 IMPLANT
DRSG AQUACEL AG ADV 3.5X14 (GAUZE/BANDAGES/DRESSINGS) ×1 IMPLANT
DRSG COVADERM 4X14 (GAUZE/BANDAGES/DRESSINGS) ×3 IMPLANT
ELECT BLADE 4.0 EZ CLEAN MEGAD (MISCELLANEOUS) ×6
ELECT REM PT RETURN 9FT ADLT (ELECTROSURGICAL) ×6
ELECTRODE BLDE 4.0 EZ CLN MEGD (MISCELLANEOUS) ×2 IMPLANT
ELECTRODE REM PT RTRN 9FT ADLT (ELECTROSURGICAL) ×4 IMPLANT
FELT TEFLON 1X6 (MISCELLANEOUS) ×6 IMPLANT
GAUZE SPONGE 4X4 12PLY STRL (GAUZE/BANDAGES/DRESSINGS) ×6 IMPLANT
GLOVE BIO SURGEON STRL SZ 6 (GLOVE) ×4 IMPLANT
GLOVE BIO SURGEON STRL SZ 6.5 (GLOVE) ×3 IMPLANT
GLOVE BIO SURGEON STRL SZ7 (GLOVE) IMPLANT
GLOVE BIO SURGEON STRL SZ7.5 (GLOVE) IMPLANT
GLOVE ORTHO TXT STRL SZ7.5 (GLOVE) ×9 IMPLANT
GLOVE SURG SS PI 6.0 STRL IVOR (GLOVE) ×2 IMPLANT
GOWN STRL REUS W/ TWL LRG LVL3 (GOWN DISPOSABLE) ×8 IMPLANT
GOWN STRL REUS W/TWL LRG LVL3 (GOWN DISPOSABLE) ×24
HEMOSTAT POWDER SURGIFOAM 1G (HEMOSTASIS) ×9 IMPLANT
INSERT FOGARTY XLG (MISCELLANEOUS) ×3 IMPLANT
IV NS IRRIG 3000ML ARTHROMATIC (IV SOLUTION) ×1 IMPLANT
KIT BASIN OR (CUSTOM PROCEDURE TRAY) ×3 IMPLANT
KIT ROOM TURNOVER OR (KITS) ×3 IMPLANT
KIT SUCTION CATH 14FR (SUCTIONS) ×9 IMPLANT
KIT SUT CK MINI COMBO 4X17 (Prosthesis & Implant Heart) ×1 IMPLANT
KIT VASOVIEW HEMOPRO VH 3000 (KITS) ×3 IMPLANT
LEAD PACING MYOCARDI (MISCELLANEOUS) ×3 IMPLANT
LINE VENT (MISCELLANEOUS) ×1 IMPLANT
MARKER GRAFT CORONARY BYPASS (MISCELLANEOUS) ×9 IMPLANT
MASK FACE 3 LYR ANT FOG FR FLM (MASK) IMPLANT
MASK SURG FACE ANTI FOG ADLT (MASK) ×3
NS IRRIG 1000ML POUR BTL (IV SOLUTION) ×15 IMPLANT
PACK OPEN HEART (CUSTOM PROCEDURE TRAY) ×3 IMPLANT
PAD ARMBOARD 7.5X6 YLW CONV (MISCELLANEOUS) ×6 IMPLANT
PAD ELECT DEFIB RADIOL ZOLL (MISCELLANEOUS) ×3 IMPLANT
PENCIL BUTTON HOLSTER BLD 10FT (ELECTRODE) ×3 IMPLANT
PUNCH AORTIC ROT 4.0MM RCL 40 (MISCELLANEOUS) ×1 IMPLANT
PUNCH AORTIC ROTATE 4.0MM (MISCELLANEOUS) IMPLANT
PUNCH AORTIC ROTATE 4.5MM 8IN (MISCELLANEOUS) IMPLANT
PUNCH AORTIC ROTATE 5MM 8IN (MISCELLANEOUS) IMPLANT
SET CARDIOPLEGIA MPS 5001102 (MISCELLANEOUS) ×1 IMPLANT
SET IRRIG TUBING LAPAROSCOPIC (IRRIGATION / IRRIGATOR) ×3 IMPLANT
SOLUTION ANTI FOG 6CC (MISCELLANEOUS) ×1 IMPLANT
SPONGE LAP 18X18 X RAY DECT (DISPOSABLE) ×1 IMPLANT
SPONGE LAP 4X18 X RAY DECT (DISPOSABLE) ×1 IMPLANT
SUT BONE WAX W31G (SUTURE) ×3 IMPLANT
SUT ETHIBON 2 0 V 52N 30 (SUTURE) ×8 IMPLANT
SUT ETHIBON EXCEL 2-0 V-5 (SUTURE) ×2 IMPLANT
SUT ETHIBOND 2 0 SH (SUTURE) ×12
SUT ETHIBOND 2 0 SH 36X2 (SUTURE) IMPLANT
SUT ETHIBOND 2 0 V4 (SUTURE) IMPLANT
SUT ETHIBOND 2 0V4 GREEN (SUTURE) IMPLANT
SUT ETHIBOND 4 0 RB 1 (SUTURE) IMPLANT
SUT ETHIBOND V-5 VALVE (SUTURE) ×2 IMPLANT
SUT ETHIBOND X763 2 0 SH 1 (SUTURE) ×14 IMPLANT
SUT MNCRL AB 3-0 PS2 18 (SUTURE) ×6 IMPLANT
SUT MNCRL AB 4-0 PS2 18 (SUTURE) ×1 IMPLANT
SUT PDS AB 1 CTX 36 (SUTURE) ×6 IMPLANT
SUT PROLENE 2 0 SH DA (SUTURE) IMPLANT
SUT PROLENE 3 0 RB 1 (SUTURE) ×1 IMPLANT
SUT PROLENE 3 0 SH DA (SUTURE) ×4 IMPLANT
SUT PROLENE 3 0 SH1 36 (SUTURE) ×5 IMPLANT
SUT PROLENE 4 0 RB 1 (SUTURE) ×33
SUT PROLENE 4 0 SH DA (SUTURE) ×3 IMPLANT
SUT PROLENE 4-0 RB1 .5 CRCL 36 (SUTURE) ×4 IMPLANT
SUT PROLENE 5 0 C 1 36 (SUTURE) ×2 IMPLANT
SUT PROLENE 6 0 C 1 30 (SUTURE) ×2 IMPLANT
SUT PROLENE 7.0 RB 3 (SUTURE) ×9 IMPLANT
SUT PROLENE 8 0 BV175 6 (SUTURE) ×4 IMPLANT
SUT PROLENE BLUE 7 0 (SUTURE) ×3 IMPLANT
SUT PROLENE POLY MONO (SUTURE) IMPLANT
SUT SILK  1 MH (SUTURE) ×5
SUT SILK 1 MH (SUTURE) ×2 IMPLANT
SUT SILK 1 TIES 10X30 (SUTURE) ×1 IMPLANT
SUT SILK 2 0 SH CR/8 (SUTURE) ×2 IMPLANT
SUT SILK 2 0 TIES 10X30 (SUTURE) ×1 IMPLANT
SUT SILK 2 0 TIES 17X18 (SUTURE) ×3
SUT SILK 2-0 18XBRD TIE BLK (SUTURE) IMPLANT
SUT SILK 3 0 SH CR/8 (SUTURE) ×1 IMPLANT
SUT SILK 4 0 TIE 10X30 (SUTURE) ×2 IMPLANT
SUT STEEL 6MS V (SUTURE) IMPLANT
SUT STEEL STERNAL CCS#1 18IN (SUTURE) IMPLANT
SUT STEEL SZ 6 DBL 3X14 BALL (SUTURE) IMPLANT
SUT TEM PAC WIRE 2 0 SH (SUTURE) ×4 IMPLANT
SUT VIC AB 1 CTX 36 (SUTURE)
SUT VIC AB 1 CTX36XBRD ANBCTR (SUTURE) IMPLANT
SUT VIC AB 2-0 CT1 27 (SUTURE) ×3
SUT VIC AB 2-0 CT1 TAPERPNT 27 (SUTURE) IMPLANT
SUT VIC AB 2-0 CTX 27 (SUTURE) ×2 IMPLANT
SUT VIC AB 3-0 SH 27 (SUTURE)
SUT VIC AB 3-0 SH 27X BRD (SUTURE) IMPLANT
SUT VIC AB 3-0 X1 27 (SUTURE) ×2 IMPLANT
SUT VICRYL 4-0 PS2 18IN ABS (SUTURE) IMPLANT
SUTURE E-PAK OPEN HEART (SUTURE) ×2 IMPLANT
SYSTEM SAHARA CHEST DRAIN ATS (WOUND CARE) ×3 IMPLANT
TAPE CLOTH SURG 4X10 WHT LF (GAUZE/BANDAGES/DRESSINGS) ×1 IMPLANT
TAPE PAPER 2X10 WHT MICROPORE (GAUZE/BANDAGES/DRESSINGS) ×1 IMPLANT
TOWEL GREEN STERILE (TOWEL DISPOSABLE) ×10 IMPLANT
TOWEL GREEN STERILE FF (TOWEL DISPOSABLE) ×6 IMPLANT
TOWEL OR 17X24 6PK STRL BLUE (TOWEL DISPOSABLE) ×6 IMPLANT
TOWEL OR 17X26 10 PK STRL BLUE (TOWEL DISPOSABLE) ×6 IMPLANT
TRAY FOLEY SILVER 16FR TEMP (SET/KITS/TRAYS/PACK) ×3 IMPLANT
TUBING INSUFFLATION (TUBING) ×3 IMPLANT
UNDERPAD 30X30 (UNDERPADS AND DIAPERS) ×3 IMPLANT
VALVE MAGNA EASE AORTIC 25MM (Prosthesis & Implant Heart) ×1 IMPLANT
VENT LEFT HEART 12002 (CATHETERS) ×3
WATER STERILE IRR 1000ML POUR (IV SOLUTION) ×6 IMPLANT
YANKAUER SUCT BULB TIP NO VENT (SUCTIONS) ×1 IMPLANT

## 2017-10-26 NOTE — Progress Notes (Signed)
  Echocardiogram Echocardiogram Transesophageal has been performed.  Theodore Jenkins 10/26/2017, 10:01 AM

## 2017-10-26 NOTE — Progress Notes (Signed)
MD called regarding patient's pre-extubation ABG -- PH 7.29; CO2 38.8; HCO3 18.8; PO2 106.    RN given orders for 1 amp bicarb and a repeat blood gas in 1hr.    RN continued with weaning process after repeat gas.  Patient extubated 2150

## 2017-10-26 NOTE — Anesthesia Procedure Notes (Signed)
Central Venous Catheter Insertion Performed by: Oleta Mouse, MD, anesthesiologist Start/End11/15/2018 6:47 AM, 10/26/2017 6:55 AM Patient location: Pre-op. Preanesthetic checklist: patient identified, IV checked, site marked, risks and benefits discussed, surgical consent, monitors and equipment checked, pre-op evaluation, timeout performed and anesthesia consent Hand hygiene performed  and maximum sterile barriers used  PA cath was placed.Swan type:thermodilution Procedure performed without using ultrasound guided technique. Attempts: 1 Patient tolerated the procedure well with no immediate complications.

## 2017-10-26 NOTE — Anesthesia Procedure Notes (Signed)
Procedure Name: Intubation Date/Time: 10/26/2017 8:14 AM Performed by: Kobee Medlen T, CRNA Pre-anesthesia Checklist: Patient identified, Emergency Drugs available, Suction available and Patient being monitored Patient Re-evaluated:Patient Re-evaluated prior to induction Oxygen Delivery Method: Circle system utilized Preoxygenation: Pre-oxygenation with 100% oxygen Induction Type: IV induction Ventilation: Oral airway inserted - appropriate to patient size and Two handed mask ventilation required Laryngoscope Size: Glidescope and 4 Tube type: Subglottic suction tube Tube size: 8.0 mm Number of attempts: 1 Airway Equipment and Method: Patient positioned with wedge pillow,  Stylet and Video-laryngoscopy Placement Confirmation: ETT inserted through vocal cords under direct vision,  positive ETCO2 and breath sounds checked- equal and bilateral Secured at: 22 cm Tube secured with: Tape Dental Injury: Teeth and Oropharynx as per pre-operative assessment

## 2017-10-26 NOTE — Progress Notes (Signed)
TCTS BRIEF SICU PROGRESS NOTE  Day of Surgery  S/P Procedure(s) (LRB): AORTIC VALVE REPLACEMENT (AVR), using Magna Ease 25 (N/A) CORONARY ARTERY BYPASS GRAFTING (CABG) x two , using left internal mammary artery and right leg greater saphenous vein harvested endoscopically (N/A) TRANSESOPHAGEAL ECHOCARDIOGRAM (TEE) (N/A)   Starting to wake NSR w/ stable hemodynamics O2 sats 100% Chest tube output low UOP excellent Labs okay  Plan: Continue routine early postop  Rexene Alberts, MD 10/26/2017 6:45 PM

## 2017-10-26 NOTE — Anesthesia Procedure Notes (Signed)
Arterial Line Insertion Start/End11/15/2018 7:05 AM, 10/26/2017 7:15 AM Performed by: Teresa Nicodemus T, Immunologist, CRNA  Patient location: Pre-op. Preanesthetic checklist: patient identified, IV checked, site marked, risks and benefits discussed, surgical consent, monitors and equipment checked and pre-op evaluation Lidocaine 1% used for infiltration and patient sedated Left, radial was placed Catheter size: 20 G Hand hygiene performed  and maximum sterile barriers used   Attempts: 1 Procedure performed without using ultrasound guided technique. Following insertion, dressing applied and Biopatch. Post procedure assessment: normal and unchanged  Patient tolerated the procedure well with no immediate complications.

## 2017-10-26 NOTE — Op Note (Signed)
CARDIOTHORACIC SURGERY OPERATIVE NOTE  Date of Procedure:  10/26/2017  Preoperative Diagnosis:   Severe Aortic Stenosis  Severe Multi-vessel Coronary Artery Disease  Postoperative Diagnosis: Same  Procedure:    Aortic Valve Replacement  Edwards Magna Ease Pericardial Tissue Valve (size 79mm, model # 3300TFX, serial # [id number])   Coronary Artery Bypass Grafting x 2  Left Internal Mammary Artery to Distal Left Anterior Descending Coronary Artery  Saphenous Vein Graft to First Obtuse Marginal Branch of Left Circumflex Coronary Artery  Endoscopic Vein Harvest from Right Thigh   Surgeon: Valentina Gu. Roxy Manns, MD  Assistant: Nicholes Rough, PA-C  Anesthesia: Midge Minium, MD  Operative Findings:  Severe aortic stenosis  Normal left ventricular systolic function  Good quality left internal mammary artery conduit  Good quality saphenous vein conduit  Small caliber fair quality target vessels for grafting           BRIEF CLINICAL NOTE AND INDICATIONS FOR SURGERY  Patient is a 71 year old obese white male with history of aortic stenosis, coronary artery disease status post PCI of left anterior descending coronary artery in 2005, hypertension, type 2 diabetes mellitus, hyperlipidemia, cerebrovascular disease with chronic occlusion of the right internal carotid artery, abdominal aortic aneurysm, Barrett's esophagitis, obstructive sleep apnea on CPAP, and lower extremity peripheral arterial disease who has been referred for surgical consultation to discuss treatment options for management of severe symptomatic aortic stenosis and multivessel coronary artery disease. Patient's cardiac history dates back to 2005 when he presented with symptoms of angina pectoris and abnormal stress test. He was found have high-grade stenosis of the left anterior descending coronary artery and underwent PCI and stenting with overlapping drug-eluting stents by Dr. Doreatha Lew. The patient did well for  many years. In follow-up he was noted to have a murmur on physical exam and echocardiograms have demonstrated the presence of aortic stenosis which has progressed in severity. Over the past year the patient has developed worsening symptoms of exertional shortness of breath and chest tightness. Follow-up echocardiogram performed 08/15/2017 revealed severe aortic stenosis with peak velocity across the aortic valve reported greater than 4.2 m/s corresponding to mean transvalvular gradient estimated 42 mmHg. The DVI was 0.17. Left ventricular systolic function remained normal with ejection fraction estimated 55-60%. Left and right heart catheterization was performed 08/25/2017 and confirmed the presence of severe aortic stenosis with peak to peak and mean transvalvular gradients measured 71 and 66 mmHg, respectively at catheterization. There was severe multivessel coronary artery disease with diffuse long segment in-stent restenosis of the left anterior descending coronary artery and high-grade 80% stenosis of a large obtuse marginal branch of the left circumflex coronary artery. There was chronic occlusion of the small posterior descending coronary artery off of the distal right coronary artery. Right heart pressures were normal. The patient was referred to Dr. Angelena Form who saw the patient in consultation on 09/08/2017. The patient was subsequently referred for elective surgical consultation.  The patient has been seen in consultation and counseled at length regarding the indications, risks and potential benefits of surgery.  All questions have been answered, and the patient provides full informed consent for the operation as described.    DETAILS OF THE OPERATIVE PROCEDURE  Preparation:  The patient is brought to the operating room on the above mentioned date and central monitoring was established by the anesthesia team including placement of Swan-Ganz catheter and radial arterial line. The patient is  placed in the supine position on the operating table.  Intravenous antibiotics are administered. General  endotracheal anesthesia is induced uneventfully. A Foley catheter is placed.  Baseline transesophageal echocardiogram was performed.  Findings were notable for severe aortic stenosis.  The aortic valve is trileaflet with severe thickening and calcification involving all 3 leaflets.  There was trivial aortic insufficiency.  There was normal left ventricular systolic function.  There was trivial mitral regurgitation.  There was no evidence for the presence of patent foramen ovale nor atrial septal defect.  The patient's chest, abdomen, both groins, and both lower extremities are prepared and draped in a sterile manner. A time out procedure is performed.   Surgical Approach and Conduit Harvest:  A median sternotomy incision was performed and the left internal mammary artery is dissected from the chest wall and prepared for bypass grafting. The left internal mammary artery is notably good quality conduit. Simultaneously, saphenous vein is obtained from the patient's right thigh using endoscopic vein harvest technique. The saphenous vein is notably good quality conduit. After removal of the saphenous vein, the small surgical incisions in the lower extremity are closed with absorbable suture. Following systemic heparinization, the left internal mammary artery was transected distally noted to have excellent flow.   Extracorporeal Cardiopulmonary Bypass and Myocardial Protection:  The pericardium is opened. The ascending aorta is normal in appearance. The ascending aorta and the right atrium are cannulated for cardioplegia bypass.  Adequate heparinization is verified.    A retrograde cardioplegia cannula is placed through the right atrium into the coronary sinus.  The operative field was continuously flooded with carbon dioxide gas.  The entire pre-bypass portion of the operation was notable for stable  hemodynamics.  Cardiopulmonary bypass was begun and a left ventricular vent placed through the right superior pulmonary vein.  The surface of the heart inspected. Distal target vessels are selected for coronary artery bypass grafting. A cardioplegia cannula is placed in the ascending aorta.  A temperature probe was placed in the interventricular septum.  The patient is cooled to 32C systemic temperature.  The aortic cross clamp is applied and cardioplegia is delivered initially in an antegrade fashion through the aortic root using modified [place] cold blood cardioplegia ([place] blood cardioplegia protocol).   The initial cardioplegic arrest is rapid with early diastolic arrest.  Repeat doses of cardioplegia are administered at 90 minutes and every 30 minutes thereafter through the coronary sinus catheter in order to maintain completely flat electrocardiogram.  Myocardial protection was felt to be excellent.   Coronary Artery Bypass Grafting:   The first obtuse marginal branch of the left circumflex coronary artery was grafted using a reversed saphenous vein graft in an end-to-side fashion.  At the site of distal anastomosis the target vessel was fair quality and measured approximately 1.5 mm in diameter.  The distal left anterior coronary artery was grafted with the left internal mammary artery in an end-to-side fashion.  At the site of distal anastomosis the target vessel was poor quality and measured approximately 1.0 mm in diameter.   Aortic Valve Replacement:  An oblique transverse aortotomy incision was performed.  There was severe calcification and atherosclerotic plaque in the aorta near the sinotubular junction and extending into the noncoronary sinus of Valsalva.  The aortic valve was inspected and notable for severe aortic stenosis.  The aortic valve leaflets were excised sharply and the aortic annulus decalcified.  Decalcification was notably tedious due to extensive calcification.   The aortic annulus was sized to accept a 25 mm prosthesis.  The aortic root and left ventricle were irrigated  with copious cold saline solution.  Aortic valve replacement was performed using interrupted horizontal mattress 2-0 Ethibond pledgeted sutures with pledgets in the subannular position.  An [facility] Ease pericardial tissue valve (size 25 mm, model # 3300TFX, serial # [id number]) was implanted uneventfully. The valve seated appropriately with adequate space beneath the left main and right coronary artery.  The aortotomy was closed using a 2-layer closure of running 4-0 Prolene suture with Teflon fe painlt strips to buttress the suture line.   Procedure Completion:  The proximal vein graft anastomosis was placed directly to the ascending aorta prior to removal of the aortic cross clamp.  The septal myocardial temperature rose rapidly after reperfusion of the left internal mammary artery graft.  One final dose of warm retrograde "reanimation dose" cardioplegia was administered through the coronary sinus catheter while all air was evacuated through the aortic root.  The aortic cross clamp was removed after a total cross clamp time of 141 minutes.  All proximal and distal coronary anastomoses were inspected for hemostasis and appropriate graft orientation. Epicardial pacing wires are fixed to the right ventricular outflow tract and to the right atrial appendage. The patient is rewarmed to 37C temperature. The aortic and left ventricular vents were removed.  The patient is weaned and disconnected from cardiopulmonary bypass.  The patient's rhythm at separation from bypass was sinus.  No pleural.  The patient was weaned from cardiopulmonary bypass without any inotropic support. Total cardiopulmonary bypass time for the operation was 171 minutes.  Followup transesophageal echocardiogram performed after separation from bypass revealed indicated a well-seated bioprosthetic tissue valve in the aortic  position that was functioning normally.  There was no perivalvular leak.  There were otherwise no changes from the preoperative exam.  The aortic and venous cannula were removed uneventfully. Protamine was administered to reverse the anticoagulation. The mediastinum and pleural space were inspected for hemostasis and irrigated with saline solution. The mediastinum and the left pleural space were drained using 3 chest tubes placed through separate stab incisions inferiorly.  The soft tissues anterior to the aorta were reapproximated loosely. The sternum is closed with double strength sternal wire. The soft tissues anterior to the sternum were closed in multiple layers and the skin is closed with a running subcuticular skin closure.  The post-bypass portion of the operation was notable for stable rhythm and hemodynamics.  No blood products were administered during the operation.   Disposition:  The patient tolerated the procedure well and is transported to the surgical intensive care in stable condition. There are no intraoperative complications. All sponge instrument and needle counts are verified correct at completion of the operation.   Valentina Gu. Roxy Manns MD 10/26/2017 1:56 PM

## 2017-10-26 NOTE — Procedures (Signed)
Extubation Procedure Note  Patient Details:   Name: Theodore Jenkins DOB: December 03, 1946 MRN: [id number]   Airway Documentation:  Airway 8 mm (Active)  Secured at (cm) 22 cm 10/26/2017  9:15 PM  Measured From Lips 10/26/2017  9:15 PM  Secured Location Left 10/26/2017  9:15 PM  Secured By Other (Comment) 10/26/2017  9:15 PM  Site Condition Dry 10/26/2017  9:15 PM    Evaluation  O2 sats: stable throughout Complications: No apparent complications Patient did tolerate procedure well. Bilateral Breath Sounds: Clear, Diminished   Yes  Charyl Dancer 10/26/2017, 9:52 PM    Pt extubated per rapid wean protocol. Pt preformed NIF of -24 VC of 842ml and IS of 750. Pt had positive cuff leak. Pt currently placed on 4 L [place] and is doing well. RT will continue to monitor

## 2017-10-26 NOTE — Brief Op Note (Signed)
10/26/2017  12:53 PM  PATIENT:  Theodore Jenkins  71 y.o. male  PRE-OPERATIVE DIAGNOSIS:  aortic stenosis, coronary artery disease  POST-OPERATIVE DIAGNOSIS:  aortic stenosis, coronary artery disease  PROCEDURE:  Procedure(s): AORTIC VALVE REPLACEMENT (AVR), using Magna Ease 25 (N/A) CORONARY ARTERY BYPASS GRAFTING (CABG) x , using left internal mammary artery and right leg greater saphenous vein harvested endoscopically (N/A) TRANSESOPHAGEAL ECHOCARDIOGRAM (TEE) (N/A)  SVG to OM1 LIMA to LAD  SURGEON:  Surgeon(s) and Role:    * Rexene Alberts, MD - Primary  PHYSICIAN ASSISTANT:  Nicholes Rough, PA-C   ANESTHESIA:   general  EBL: tbd  BLOOD ADMINISTERED:none  DRAINS: routine   LOCAL MEDICATIONS USED:  NONE  SPECIMEN:  Source of Specimen:  aortic valve leaflets  DISPOSITION OF SPECIMEN:  PATHOLOGY  COUNTS:  YES  DICTATION: .Dragon Dictation  PLAN OF CARE: Admit to inpatient   PATIENT DISPOSITION:  ICU - intubated and hemodynamically stable.   Delay start of Pharmacological VTE agent (>24hrs) due to surgical blood loss or risk of bleeding: yes

## 2017-10-26 NOTE — Progress Notes (Signed)
Rapid weaning protocol 

## 2017-10-26 NOTE — Anesthesia Preprocedure Evaluation (Addendum)
Anesthesia Evaluation  Patient identified by MRN, date of birth, ID band Patient awake    Reviewed: Allergy & Precautions, NPO status , Patient's Chart, lab work & pertinent test results  History of Anesthesia Complications (+) PONV, MALIGNANT HYPERTHERMIA, Family history of anesthesia reaction and history of anesthetic complications (sister with suspected MH )  Airway Mallampati: IV  TM Distance: <3 FB Neck ROM: Full    Dental  (+) Dental Advisory Given   Pulmonary sleep apnea and Continuous Positive Airway Pressure Ventilation , former smoker,    breath sounds clear to auscultation       Cardiovascular hypertension, Pt. on medications + CAD, + Cardiac Stents and + Peripheral Vascular Disease  + Valvular Problems/Murmurs (at cath mean grad 66.1 mmHg ) AS  Rhythm:Regular Rate:Normal  Echo (08/2017): Severe AS with mean gradient of 42 mmHg.   Neuro/Psych negative neurological ROS     GI/Hepatic Neg liver ROS, GERD  Medicated and Controlled,  Endo/Other  diabetes (glu 144), Oral Hypoglycemic AgentsMorbid obesity  Renal/GU negative Renal ROS     Musculoskeletal   Abdominal (+) + obese,   Peds  Hematology negative hematology ROS (+) Hb 12.9, plt 136k   Anesthesia Other Findings   Reproductive/Obstetrics                            Anesthesia Physical Anesthesia Plan  ASA: IV  Anesthesia Plan: General   Post-op Pain Management:    Induction: Intravenous  PONV Risk Score and Plan: 4 or greater and Treatment may vary due to age or medical condition  Airway Management Planned: Oral ETT  Additional Equipment: Arterial line, PA Cath, TEE and Ultrasound Guidance Line Placement  Intra-op Plan:   Post-operative Plan: Post-operative intubation/ventilation  Informed Consent: I have reviewed the patients History and Physical, chart, labs and discussed the procedure including the risks, benefits  and alternatives for the proposed anesthesia with the patient or authorized representative who has indicated his/her understanding and acceptance.   Dental advisory given  Plan Discussed with: CRNA and Surgeon  Anesthesia Plan Comments: (Plan routine monitors, A line, PA cath, GETA with TEE and post op ventilation Familial MH: NON-TRIGGERING ANESTHETIC)        Anesthesia Quick Evaluation

## 2017-10-26 NOTE — Anesthesia Procedure Notes (Signed)
Central Venous Catheter Insertion Performed by: Oleta Mouse, MD, anesthesiologist Start/End11/15/2018 6:47 AM, 10/26/2017 6:55 AM Patient location: Pre-op. Preanesthetic checklist: patient identified, IV checked, site marked, risks and benefits discussed, surgical consent, monitors and equipment checked, pre-op evaluation, timeout performed and anesthesia consent Lidocaine 1% used for infiltration and patient sedated Hand hygiene performed  and maximum sterile barriers used  Catheter size: 9 Fr Total catheter length 10. MAC introducer Procedure performed using ultrasound guided technique. Ultrasound Notes:anatomy identified, needle tip was noted to be adjacent to the nerve/plexus identified, no ultrasound evidence of intravascular and/or intraneural injection and image(s) printed for medical record Attempts: 1 Following insertion, line sutured, dressing applied and Biopatch. Post procedure assessment: blood return through all ports, free fluid flow and no air  Patient tolerated the procedure well with no immediate complications.

## 2017-10-26 NOTE — OR Nursing (Signed)
13:10pm - 45 minute call to SICU charge nurse 13:50pm - 20 minute call to SICU charge nurse

## 2017-10-26 NOTE — Interval H&P Note (Signed)
History and Physical Interval Note:  10/26/2017 6:18 AM  Theodore Jenkins  has presented today for surgery, with the diagnosis of aortic stenosis, coronary artery disease  The various methods of treatment have been discussed with the patient and family. After consideration of risks, benefits and other options for treatment, the patient has consented to  Procedure(s): AORTIC VALVE REPLACEMENT (AVR),CABG,TEE (N/A) CORONARY ARTERY BYPASS GRAFTING (CABG) (N/A) TRANSESOPHAGEAL ECHOCARDIOGRAM (TEE) (N/A) as a surgical intervention .  The patient's history has been reviewed, patient examined, no change in status, stable for surgery.  I have reviewed the patient's chart and labs.  Questions were answered to the patient's satisfaction.     Rexene Alberts

## 2017-10-26 NOTE — Transfer of Care (Signed)
Immediate Anesthesia Transfer of Care Note  Patient: Theodore Jenkins  Procedure(s) Performed: AORTIC VALVE REPLACEMENT (AVR), using Magna Ease 25 (N/A Chest) CORONARY ARTERY BYPASS GRAFTING (CABG) x two , using left internal mammary artery and right leg greater saphenous vein harvested endoscopically (N/A Chest) TRANSESOPHAGEAL ECHOCARDIOGRAM (TEE) (N/A )  Patient Location: SICU  Anesthesia Type:General  Level of Consciousness: Patient remains intubated per anesthesia plan  Airway & Oxygen Therapy: Patient remains intubated per anesthesia plan and Patient placed on Ventilator (see vital sign flow sheet for setting)  Post-op Assessment: Report given to RN and Post -op Vital signs reviewed and stable  Post vital signs: Reviewed and stable  Last Vitals:  Vitals:   10/26/17 0623  BP: 129/61  Pulse: 67  Resp: 18  SpO2: 96%    Last Pain: There were no vitals filed for this visit.       Complications: No apparent anesthesia complications

## 2017-10-26 NOTE — Progress Notes (Signed)
Increased RR per MD due to ABG results.

## 2017-10-27 ENCOUNTER — Other Ambulatory Visit: Payer: Self-pay

## 2017-10-27 ENCOUNTER — Inpatient Hospital Stay (HOSPITAL_COMMUNITY): Payer: PPO

## 2017-10-27 ENCOUNTER — Encounter (HOSPITAL_COMMUNITY): Payer: Self-pay | Admitting: Thoracic Surgery (Cardiothoracic Vascular Surgery)

## 2017-10-27 LAB — BLOOD GAS, ARTERIAL
Acid-base deficit: 5.1 mmol/L — ABNORMAL HIGH (ref 0.0–2.0)
Bicarbonate: 19.5 mmol/L — ABNORMAL LOW (ref 20.0–28.0)
O2 Content: 2 L/min
O2 Saturation: 97.9 %
Patient temperature: 99.9
pCO2 arterial: 37.4 mmHg (ref 32.0–48.0)
pH, Arterial: 7.341 — ABNORMAL LOW (ref 7.350–7.450)
pO2, Arterial: 119 mmHg — ABNORMAL HIGH (ref 83.0–108.0)

## 2017-10-27 LAB — BASIC METABOLIC PANEL
Anion gap: 6 (ref 5–15)
BUN: 12 mg/dL (ref 6–20)
CO2: 21 mmol/L — ABNORMAL LOW (ref 22–32)
Calcium: 8.1 mg/dL — ABNORMAL LOW (ref 8.9–10.3)
Chloride: 108 mmol/L (ref 101–111)
Creatinine, Ser: 0.94 mg/dL (ref 0.61–1.24)
GFR calc Af Amer: >60 mL/min (ref >60)
GFR calc non Af Amer: >60 mL/min (ref >60)
Glucose, Bld: 133 mg/dL — ABNORMAL HIGH (ref 65–99)
Potassium: 4 mmol/L (ref 3.5–5.1)
Sodium: 135 mmol/L (ref 135–145)

## 2017-10-27 LAB — POCT I-STAT 3, ART BLOOD GAS (G3+)
Acid-base deficit: 6 mmol/L — ABNORMAL HIGH (ref 0.0–2.0)
Bicarbonate: 19.6 mmol/L — ABNORMAL LOW (ref 20.0–28.0)
O2 Saturation: 96 %
Patient temperature: 37.8
TCO2: 21 mmol/L — ABNORMAL LOW (ref 22–32)
pCO2 arterial: 39.3 mmHg (ref 32.0–48.0)
pH, Arterial: 7.309 — ABNORMAL LOW (ref 7.350–7.450)
pO2, Arterial: 94 mmHg (ref 83.0–108.0)

## 2017-10-27 LAB — GLUCOSE, CAPILLARY
Glucose-Capillary: 142 mg/dL — ABNORMAL HIGH (ref 65–99)
Glucose-Capillary: 134 mg/dL — ABNORMAL HIGH (ref 65–99)
Glucose-Capillary: 133 mg/dL — ABNORMAL HIGH (ref 65–99)
Glucose-Capillary: 135 mg/dL — ABNORMAL HIGH (ref 65–99)
Glucose-Capillary: 129 mg/dL — ABNORMAL HIGH (ref 65–99)
Glucose-Capillary: 132 mg/dL — ABNORMAL HIGH (ref 65–99)
Glucose-Capillary: 140 mg/dL — ABNORMAL HIGH (ref 65–99)
Glucose-Capillary: 141 mg/dL — ABNORMAL HIGH (ref 65–99)
Glucose-Capillary: 144 mg/dL — ABNORMAL HIGH (ref 65–99)
Glucose-Capillary: 146 mg/dL — ABNORMAL HIGH (ref 65–99)
Glucose-Capillary: 147 mg/dL — ABNORMAL HIGH (ref 65–99)
Glucose-Capillary: 214 mg/dL — ABNORMAL HIGH (ref 65–99)
Glucose-Capillary: 216 mg/dL — ABNORMAL HIGH (ref 65–99)

## 2017-10-27 LAB — CBC
HCT: 29.7 % — ABNORMAL LOW (ref 39.0–52.0)
HCT: 29.4 % — ABNORMAL LOW (ref 39.0–52.0)
Hemoglobin: 9.8 g/dL — ABNORMAL LOW (ref 13.0–17.0)
Hemoglobin: 9.8 g/dL — ABNORMAL LOW (ref 13.0–17.0)
MCH: 31.4 pg (ref 26.0–34.0)
MCH: 32.3 pg (ref 26.0–34.0)
MCHC: 33 g/dL (ref 30.0–36.0)
MCHC: 33.3 g/dL (ref 30.0–36.0)
MCV: 95.2 fL (ref 78.0–100.0)
MCV: 97 fL (ref 78.0–100.0)
Platelets: 120 10*3/uL — ABNORMAL LOW (ref 150–400)
Platelets: 112 10*3/uL — ABNORMAL LOW (ref 150–400)
RBC: 3.12 MIL/uL — ABNORMAL LOW (ref 4.22–5.81)
RBC: 3.03 MIL/uL — ABNORMAL LOW (ref 4.22–5.81)
RDW: 13.9 % (ref 11.5–15.5)
RDW: 14.1 % (ref 11.5–15.5)
WBC: 20.4 10*3/uL — ABNORMAL HIGH (ref 4.0–10.5)
WBC: 22.3 10*3/uL — ABNORMAL HIGH (ref 4.0–10.5)

## 2017-10-27 LAB — POCT I-STAT, CHEM 8
BUN: 15 mg/dL (ref 6–20)
Calcium, Ion: 1.2 mmol/L (ref 1.15–1.40)
Chloride: 105 mmol/L (ref 101–111)
Creatinine, Ser: 0.9 mg/dL (ref 0.61–1.24)
Glucose, Bld: 204 mg/dL — ABNORMAL HIGH (ref 65–99)
HCT: 29 % — ABNORMAL LOW (ref 39.0–52.0)
Hemoglobin: 9.9 g/dL — ABNORMAL LOW (ref 13.0–17.0)
Potassium: 4.1 mmol/L (ref 3.5–5.1)
Sodium: 138 mmol/L (ref 135–145)
TCO2: 19 mmol/L — ABNORMAL LOW (ref 22–32)

## 2017-10-27 LAB — CREATININE, SERUM
Creatinine, Ser: 1.11 mg/dL (ref 0.61–1.24)
GFR calc Af Amer: >60 mL/min (ref >60)
GFR calc non Af Amer: >60 mL/min (ref >60)

## 2017-10-27 LAB — MAGNESIUM
Magnesium: 2.3 mg/dL (ref 1.7–2.4)
Magnesium: 2 mg/dL (ref 1.7–2.4)

## 2017-10-27 LAB — POCT I-STAT 4, (NA,K, GLUC, HGB,HCT)
Glucose, Bld: 135 mg/dL — ABNORMAL HIGH (ref 65–99)
HCT: 33 % — ABNORMAL LOW (ref 39.0–52.0)
Hemoglobin: 11.2 g/dL — ABNORMAL LOW (ref 13.0–17.0)
Potassium: 3.9 mmol/L (ref 3.5–5.1)
Sodium: 140 mmol/L (ref 135–145)

## 2017-10-27 MED ORDER — INSULIN DETEMIR 100 UNIT/ML ~~LOC~~ SOLN
20.0000 [IU] | Freq: Two times a day (BID) | SUBCUTANEOUS | Status: DC
  Administered 2017-10-27 – 2017-10-29 (×6): 20 [IU] via SUBCUTANEOUS
  Filled 2017-10-27 (×7): qty 0.2

## 2017-10-27 MED ORDER — FUROSEMIDE 10 MG/ML IJ SOLN
20.0000 mg | Freq: Four times a day (QID) | INTRAMUSCULAR | Status: AC
  Administered 2017-10-27 (×3): 20 mg via INTRAVENOUS
  Filled 2017-10-27 (×3): qty 2

## 2017-10-27 MED ORDER — KETOROLAC TROMETHAMINE 15 MG/ML IJ SOLN
15.0000 mg | Freq: Four times a day (QID) | INTRAMUSCULAR | Status: AC
  Administered 2017-10-27 – 2017-10-28 (×5): 15 mg via INTRAVENOUS
  Filled 2017-10-27 (×5): qty 1

## 2017-10-27 MED ORDER — CHLORHEXIDINE GLUCONATE 0.12 % MT SOLN
OROMUCOSAL | Status: AC
  Administered 2017-10-27: 15 mL via OROMUCOSAL
  Filled 2017-10-27: qty 15

## 2017-10-27 MED ORDER — INSULIN ASPART 100 UNIT/ML ~~LOC~~ SOLN
0.0000 [IU] | SUBCUTANEOUS | Status: DC
  Administered 2017-10-27: 8 [IU] via SUBCUTANEOUS
  Administered 2017-10-27: 2 [IU] via SUBCUTANEOUS
  Administered 2017-10-27: 8 [IU] via SUBCUTANEOUS
  Administered 2017-10-28: 4 [IU] via SUBCUTANEOUS
  Administered 2017-10-28: 2 [IU] via SUBCUTANEOUS
  Administered 2017-10-28 (×2): 4 [IU] via SUBCUTANEOUS
  Administered 2017-10-28: 2 [IU] via SUBCUTANEOUS
  Administered 2017-10-28: 4 [IU] via SUBCUTANEOUS
  Administered 2017-10-29 (×2): 2 [IU] via SUBCUTANEOUS

## 2017-10-27 MED ORDER — METOCLOPRAMIDE HCL 5 MG/ML IJ SOLN
10.0000 mg | Freq: Four times a day (QID) | INTRAMUSCULAR | Status: AC
  Administered 2017-10-27 – 2017-10-28 (×4): 10 mg via INTRAVENOUS
  Filled 2017-10-27 (×4): qty 2

## 2017-10-27 MED ORDER — METOCLOPRAMIDE HCL 5 MG/ML IJ SOLN: 5.0000 mg | Freq: Four times a day (QID) | INTRAMUSCULAR | Status: DC

## 2017-10-27 MED FILL — Sodium Bicarbonate IV Soln 8.4%: INTRAVENOUS | Qty: 50 | Status: AC

## 2017-10-27 MED FILL — Sodium Chloride IV Soln 0.9%: INTRAVENOUS | Qty: 2000 | Status: AC

## 2017-10-27 MED FILL — Lidocaine HCl IV Inj 20 MG/ML: INTRAVENOUS | Qty: 5 | Status: AC

## 2017-10-27 MED FILL — Electrolyte-R (PH 7.4) Solution: INTRAVENOUS | Qty: 5000 | Status: AC

## 2017-10-27 MED FILL — Mannitol IV Soln 20%: INTRAVENOUS | Qty: 500 | Status: AC

## 2017-10-27 NOTE — Care Management Note (Signed)
Case Management Note  Patient Details  Name: Theodore Jenkins MRN: [id number] Date of Birth: [birth year]  Subjective/Objective:   POD 1 CABG, AVR, cont with chest tubes.                   Action/Plan: NCM will follow for dc needs.   Expected Discharge Date:                  Expected Discharge Plan:  Home/Self Care  In-House Referral:     Discharge planning Services  CM Consult  Post Acute Care Choice:    Choice offered to:     DME Arranged:    DME Agency:     HH Arranged:    HH Agency:     Status of Service:  In process, will continue to follow  If discussed at Long Length of Stay Meetings, dates discussed:    Additional Comments:  Zenon Mayo, RN 10/27/2017, 4:53 PM

## 2017-10-27 NOTE — Progress Notes (Signed)
CT surgery p.m. Rounds  Patient examined and record reviewed.Hemodynamics stable,labs satisfactory.Patient had stable day.Continue current care. Theodore Jenkins 10/27/2017

## 2017-10-27 NOTE — Progress Notes (Addendum)
[facility] DAILY ICU PROGRESS NOTE                   [address]            [place], [address]          [phone]   1 Day Post-Op Procedure(s) (LRB): AORTIC VALVE REPLACEMENT (AVR), using Magna Ease 25 (N/A) CORONARY ARTERY BYPASS GRAFTING (CABG) x two , using left internal mammary artery and right leg greater saphenous vein harvested endoscopically (N/A) TRANSESOPHAGEAL ECHOCARDIOGRAM (TEE) (N/A)  Total Length of Stay:  LOS: 1 day   Subjective: Having some nausea but has not been sick since this morning. The medication is helping  Objective: Vital signs in last 24 hours: Temp:  [97.2 F (36.2 C)-100.2 F (37.9 C)] 99.1 F (37.3 C) (11/16 0700) Pulse Rate:  [81-94] 88 (11/16 1030) Cardiac Rhythm: Normal sinus rhythm (11/16 0400) Resp:  [9-29] 27 (11/16 0700) BP: (83-131)/(56-74) 128/56 (11/16 1030) SpO2:  [18 %-100 %] 97 % (11/16 0700) Arterial Line BP: (80-156)/(46-70) 130/59 (11/16 0700) FiO2 (%):  [40 %-50 %] 40 % (11/15 2115) Weight:  [235 lb 0.2 oz (106.6 kg)] 235 lb 0.2 oz (106.6 kg) (11/16 0458)  Filed Weights   10/27/17 0458  Weight: 235 lb 0.2 oz (106.6 kg)    Weight change:    Hemodynamic parameters for last 24 hours: PAP: (24-42)/(11-28) 26/15 CO:  [3.2 L/min-5.5 L/min] 5.4 L/min CI:  [1.4 L/min/m2-2.5 L/min/m2] 2.4 L/min/m2  Intake/Output from previous day: 11/15 0701 - 11/16 0700 In: 7671.3 [I.V.:5571.3; Blood:300; IV Piggyback:1800] Out: 7494 [Urine:3840; Emesis/NG output:170; Blood:800; Chest Tube:730]  Intake/Output this shift: No intake/output data recorded.  Current Meds: Scheduled Meds: . acetaminophen  1,000 mg Oral Q6H  . aspirin EC  325 mg Oral Daily  . bisacodyl  10 mg Oral Daily   Or  . bisacodyl  10 mg Rectal Daily  . chlorhexidine gluconate (MEDLINE KIT)  15 mL Mouth Rinse BID  . docusate sodium  200 mg Oral Daily  . furosemide  20 mg Intravenous Q6H  . insulin aspart  0-24 Units Subcutaneous Q4H  . insulin  detemir  20 Units Subcutaneous BID  . insulin regular  0-10 Units Intravenous TID WC  . ketorolac  15 mg Intravenous Q6H  . mouth rinse  15 mL Mouth Rinse BID  . metoprolol tartrate  12.5 mg Oral BID  . [START ON 10/28/2017] pantoprazole  40 mg Oral Daily  . sodium chloride flush  10-40 mL Intracatheter Q12H  . sodium chloride flush  3 mL Intravenous Q12H   Continuous Infusions: . sodium chloride    . cefUROXime (ZINACEF)  IV Stopped (10/27/17 0853)  . insulin (NOVOLIN-R) infusion 4.8 Units/hr (10/27/17 0828)  . lactated ringers 30 mL/hr at 10/26/17 2000  . lactated ringers Stopped (10/26/17 2111)   PRN Meds:.metoprolol tartrate, morphine injection, ondansetron (ZOFRAN) IV, oxyCODONE, sodium chloride flush, sodium chloride flush, traMADol  General appearance: alert, cooperative and no distress Heart: regular rate and rhythm, S1, S2 normal, no murmur, click, rub or gallop Lungs: clear to auscultation bilaterally Abdomen: soft, non-tender; bowel sounds normal; no masses,  no organomegaly Extremities: supper extremity swelling Wound: clean and dry dressed with sterile dressing  Lab Results: CBC: Recent Labs    10/26/17 2023 10/26/17 2035 10/27/17 0310  WBC 21.6*  --  20.4*  HGB 10.3* 9.9* 9.8*  HCT 30.9* 29.0* 29.7*  PLT 133*  --  120*   BMET:  Recent Labs  10/26/17 2035 10/27/17 0310  NA 140 135  K 4.3 4.0  CL 107 108  CO2  --  21*  GLUCOSE 143* 133*  BUN 14 12  CREATININE 0.80 0.94  CALCIUM  --  8.1*    CMET: Lab Results  Component Value Date   WBC 20.4 (H) 10/27/2017   HGB 9.8 (L) 10/27/2017   HCT 29.7 (L) 10/27/2017   PLT 120 (L) 10/27/2017   GLUCOSE 133 (H) 10/27/2017   CHOL 105 05/07/2013   TRIG 110.0 05/07/2013   HDL 35.80 (L) 05/07/2013   LDLCALC 47 05/07/2013   ALT 22 10/23/2017   AST 30 10/23/2017   NA 135 10/27/2017   K 4.0 10/27/2017   CL 108 10/27/2017   CREATININE 0.94 10/27/2017   BUN 12 10/27/2017   CO2 21 (L) 10/27/2017   INR 1.49  10/26/2017   HGBA1C 7.4 (H) 10/23/2017      PT/INR:  Recent Labs    10/26/17 1432  LABPROT 17.9*  INR 1.49   Radiology: Dg Chest Port 1 View  Result Date: 10/27/2017 CLINICAL DATA:  Status postextubation EXAM: PORTABLE CHEST 1 VIEW COMPARISON:  October 26, 2017 FINDINGS: Endotracheal tube and nasogastric tube have been removed. Swan-Ganz catheter tip is in the proximal right main pulmonary artery. There is a mediastinal drain and left chest tube. Temporary pacemaker wires are attached to the right heart. No pneumothorax. There is patchy consolidation in the left base with small left pleural effusion. The lungs elsewhere clear. Heart is mildly enlarged with pulmonary vascularity within normal limits. No evident adenopathy. No bone lesions. Status post coronary artery bypass grafting and aortic valve replacement. IMPRESSION: No pneumothorax. Patchy infiltrate left base concerning for pneumonia, likely with atelectasis. Small left pleural effusion. Stable cardiac prominence. Electronically Signed   By: Lowella Grip III M.D.   On: 10/27/2017 08:57   Dg Chest Port 1 View  Result Date: 10/26/2017 CLINICAL DATA:  Status post CABG EXAM: PORTABLE CHEST 1 VIEW COMPARISON:  10/23/2017 FINDINGS: Endotracheal tube tip is about 3.7 cm superior to the carina. Interval sternotomy. Valvular prosthesis. Esophageal tube tip extends below diaphragm but is not included. Ascending catheter or mediastinal drain and left-sided chest tube. No definitive pneumothorax. Right-sided central venous Swan-Ganz catheter with tip directed to the right over the pulmonary artery. Cardiomegaly with mild vascular congestion. Atelectasis at the left lung base. IMPRESSION: 1. Placement of support lines and tubes as above with interval postoperative changes of the mediastinum. No pneumothorax 2. Cardiomegaly with vascular congestion and interval consolidation or atelectasis at the left base. Electronically Signed   By: Donavan Foil M.D.   On: 10/26/2017 15:19     Assessment/Plan: S/P Procedure(s) (LRB): AORTIC VALVE REPLACEMENT (AVR), using Magna Ease 25 (N/A) CORONARY ARTERY BYPASS GRAFTING (CABG) x two , using left internal mammary artery and right leg greater saphenous vein harvested endoscopically (N/A) TRANSESOPHAGEAL ECHOCARDIOGRAM (TEE) (N/A)  1. CV-remains hemodynamically stable off Neo. HR in the 80s NSR. BP well controlled 2. Pulm-tolerating 2L [place] with good oxygen saturation. CXR showed no pneumothorax, small left pleural effusion, and left base atelectasis. Continue incentive spirometer. Chest tube output 748m/24 hours, keep.  3. Renal-creatinine 0.94, good electrolytes. May need a dose of diuretics later today since he is now off Neo for fluid overload.   4. Expected acute blood loss anemia. H and H stable 5. Leukocytosis-continue post-op antibiotics and trend. Has been trending down.  6. Endo-blood glucose level well controlled on insulin drip. 7. GBelenda Jenkins  treated with Zofran  Plan: continue chest tubes for increased output. Can probably remove a-line since off vasoactive medication. Continue to use incentive spirometer and pulm toilet. OOB to chair.   Elgie Collard 10/27/2017 11:31 AM   I have seen and examined the patient and agree with the assessment and plan as outlined.  Overall doing well POD1.  Mobilize.  Diuresis.  D/C tubes later today or tomorrow depending on output.  Add Reglan.  Rexene Alberts, MD 10/27/2017

## 2017-10-27 NOTE — Anesthesia Postprocedure Evaluation (Signed)
Anesthesia Post Note  Patient: Theodore Jenkins  Procedure(s) Performed: AORTIC VALVE REPLACEMENT (AVR), using Magna Ease 25 (N/A Chest) CORONARY ARTERY BYPASS GRAFTING (CABG) x two , using left internal mammary artery and right leg greater saphenous vein harvested endoscopically (N/A Chest) TRANSESOPHAGEAL ECHOCARDIOGRAM (TEE) (N/A )     Patient location during evaluation: SICU Anesthesia Type: General Level of consciousness: awake and alert, patient cooperative and oriented Pain management: pain level controlled Vital Signs Assessment: post-procedure vital signs reviewed and stable Respiratory status: nonlabored ventilation, spontaneous breathing, respiratory function stable and patient connected to nasal cannula oxygen Cardiovascular status: blood pressure returned to baseline and stable Postop Assessment: adequate PO intake (intermittent nausea today) Anesthetic complications: no Comments: Pt doing well POD 1. Hemodynamically stable, pain controlled, no recall or sore throat. Nauseated intermittently, but able to take po's.     Last Vitals:  Vitals:   10/27/17 1200 10/27/17 1256  BP: 130/76   Pulse: 86   Resp: (!) 23   Temp:  36.9 C  SpO2: 98%     Last Pain:  Vitals:   10/27/17 1256  TempSrc: Oral  PainSc:                  Fredia Chittenden,E. Crystle Carelli

## 2017-10-28 ENCOUNTER — Inpatient Hospital Stay (HOSPITAL_COMMUNITY): Payer: PPO

## 2017-10-28 LAB — POCT I-STAT, CHEM 8
BUN: 23 mg/dL — ABNORMAL HIGH (ref 6–20)
Calcium, Ion: 1.18 mmol/L (ref 1.15–1.40)
Chloride: 100 mmol/L — ABNORMAL LOW (ref 101–111)
Creatinine, Ser: 1 mg/dL (ref 0.61–1.24)
Glucose, Bld: 204 mg/dL — ABNORMAL HIGH (ref 65–99)
HCT: 27 % — ABNORMAL LOW (ref 39.0–52.0)
Hemoglobin: 9.2 g/dL — ABNORMAL LOW (ref 13.0–17.0)
Potassium: 4 mmol/L (ref 3.5–5.1)
Sodium: 136 mmol/L (ref 135–145)
TCO2: 24 mmol/L (ref 22–32)

## 2017-10-28 LAB — GLUCOSE, CAPILLARY
Glucose-Capillary: 138 mg/dL — ABNORMAL HIGH (ref 65–99)
Glucose-Capillary: 143 mg/dL — ABNORMAL HIGH (ref 65–99)
Glucose-Capillary: 161 mg/dL — ABNORMAL HIGH (ref 65–99)
Glucose-Capillary: 159 mg/dL — ABNORMAL HIGH (ref 65–99)
Glucose-Capillary: 198 mg/dL — ABNORMAL HIGH (ref 65–99)
Glucose-Capillary: 178 mg/dL — ABNORMAL HIGH (ref 65–99)

## 2017-10-28 LAB — CBC
HCT: 26.7 % — ABNORMAL LOW (ref 39.0–52.0)
Hemoglobin: 8.6 g/dL — ABNORMAL LOW (ref 13.0–17.0)
MCH: 31.2 pg (ref 26.0–34.0)
MCHC: 32.2 g/dL (ref 30.0–36.0)
MCV: 96.7 fL (ref 78.0–100.0)
Platelets: 94 10*3/uL — ABNORMAL LOW (ref 150–400)
RBC: 2.76 MIL/uL — ABNORMAL LOW (ref 4.22–5.81)
RDW: 14 % (ref 11.5–15.5)
WBC: 18.2 10*3/uL — ABNORMAL HIGH (ref 4.0–10.5)

## 2017-10-28 LAB — BASIC METABOLIC PANEL
Anion gap: 6 (ref 5–15)
BUN: 18 mg/dL (ref 6–20)
CO2: 24 mmol/L (ref 22–32)
Calcium: 8.3 mg/dL — ABNORMAL LOW (ref 8.9–10.3)
Chloride: 103 mmol/L (ref 101–111)
Creatinine, Ser: 1.01 mg/dL (ref 0.61–1.24)
GFR calc Af Amer: >60 mL/min (ref >60)
GFR calc non Af Amer: >60 mL/min (ref >60)
Glucose, Bld: 150 mg/dL — ABNORMAL HIGH (ref 65–99)
Potassium: 3.8 mmol/L (ref 3.5–5.1)
Sodium: 133 mmol/L — ABNORMAL LOW (ref 135–145)

## 2017-10-28 MED ORDER — METOLAZONE 5 MG PO TABS
5.0000 mg | ORAL_TABLET | Freq: Once | ORAL | Status: AC
  Administered 2017-10-29: 5 mg via ORAL
  Filled 2017-10-28: qty 1

## 2017-10-28 MED ORDER — FUROSEMIDE 10 MG/ML IJ SOLN
40.0000 mg | Freq: Two times a day (BID) | INTRAMUSCULAR | Status: AC
  Administered 2017-10-28 – 2017-10-29 (×3): 40 mg via INTRAVENOUS
  Filled 2017-10-28 (×3): qty 4

## 2017-10-28 MED ORDER — SORBITOL 70 % SOLN
30.0000 mL | Freq: Once | Status: DC
Start: 2017-10-29 — End: 2017-10-30
  Filled 2017-10-28: qty 30

## 2017-10-28 NOTE — Progress Notes (Signed)
RT placed pt on CPAP with pressure of 12 and 2L bled in. Pt using home nasal pillows and tolerating well. RT refilled water chamber and will cont to mont as needed.

## 2017-10-28 NOTE — Progress Notes (Signed)
CT surgery p.m. Rounds  Patient examined and record reviewed.Hemodynamics stable,labs satisfactory.Patient had stable day.Continue current care. Theodore Jenkins 10/28/2017

## 2017-10-28 NOTE — Progress Notes (Signed)
2 Days Post-Op Procedure(s) (LRB): AORTIC VALVE REPLACEMENT (AVR), using Magna Ease 25 (N/A) CORONARY ARTERY BYPASS GRAFTING (CABG) x two , using left internal mammary artery and right leg greater saphenous vein harvested endoscopically (N/A) TRANSESOPHAGEAL ECHOCARDIOGRAM (TEE) (N/A) Subjective: Less nausea Chest tubes still draining Needs more diuresis nsr Objective: Vital signs in last 24 hours: Temp:  [97.8 F (36.6 C)-98.8 F (37.1 C)] 98.2 F (36.8 C) (11/17 1144) Pulse Rate:  [76-92] 90 (11/17 1200) Cardiac Rhythm: Heart block (11/17 1200) Resp:  [12-33] 20 (11/17 1200) BP: (109-139)/(63-86) 127/74 (11/17 1200) SpO2:  [94 %-100 %] 96 % (11/17 1200) Arterial Line BP: (127-174)/(55-69) 174/62 (11/16 1700) Weight:  [241 lb 6.5 oz (109.5 kg)] 241 lb 6.5 oz (109.5 kg) (11/17 0600)  Hemodynamic parameters for last 24 hours:    Intake/Output from previous day: 11/16 0701 - 11/17 0700 In: 1390 [P.O.:840; I.V.:450; IV Piggyback:100] Out: 2150 [Urine:1750; Chest Tube:400] Intake/Output this shift: Total I/O In: 303 [P.O.:240; I.V.:13; IV Piggyback:50] Out: 110 [Chest Tube:110]       Exam    General- alert and comfortable   Lungs- clear without rales, wheezes   Cor- regular rate and rhythm, no murmur , gallop   Abdomen- soft, non-tender   Extremities - warm, non-tender, minimal edema   Neuro- oriented, appropriate, no focal weakness   Lab Results: Recent Labs    10/27/17 1641 10/27/17 1651 10/28/17 0335  WBC 22.3*  --  18.2*  HGB 9.8* 9.9* 8.6*  HCT 29.4* 29.0* 26.7*  PLT 112*  --  94*   BMET:  Recent Labs    10/27/17 0310  10/27/17 1651 10/28/17 0335  NA 135  --  138 133*  K 4.0  --  4.1 3.8  CL 108  --  105 103  CO2 21*  --   --  24  GLUCOSE 133*  --  204* 150*  BUN 12  --  15 18  CREATININE 0.94   < > 0.90 1.01  CALCIUM 8.1*  --   --  8.3*   < > = values in this interval not displayed.    PT/INR:  Recent Labs    10/26/17 1432  LABPROT 17.9*   INR 1.49   ABG    Component Value Date/Time   PHART 7.341 (L) 10/27/2017 0455   HCO3 19.5 (L) 10/27/2017 0455   TCO2 19 (L) 10/27/2017 1651   ACIDBASEDEF 5.1 (H) 10/27/2017 0455   O2SAT 97.9 10/27/2017 0455   CBG (last 3)  Recent Labs    10/28/17 0359 10/28/17 0801 10/28/17 1143  GLUCAP 143* 161* 159*    Assessment/Plan: S/P Procedure(s) (LRB): AORTIC VALVE REPLACEMENT (AVR), using Magna Ease 25 (N/A) CORONARY ARTERY BYPASS GRAFTING (CABG) x two , using left internal mammary artery and right leg greater saphenous vein harvested endoscopically (N/A) TRANSESOPHAGEAL ECHOCARDIOGRAM (TEE) (N/A) Mobilize Diuresis Diabetes control DC foley  add metolazone   LOS: 2 days    Theodore Jenkins 10/28/2017

## 2017-10-29 ENCOUNTER — Inpatient Hospital Stay (HOSPITAL_COMMUNITY): Payer: PPO

## 2017-10-29 LAB — CBC
HCT: 27.1 % — ABNORMAL LOW (ref 39.0–52.0)
Hemoglobin: 8.9 g/dL — ABNORMAL LOW (ref 13.0–17.0)
MCH: 31.4 pg (ref 26.0–34.0)
MCHC: 32.8 g/dL (ref 30.0–36.0)
MCV: 95.8 fL (ref 78.0–100.0)
Platelets: 115 10*3/uL — ABNORMAL LOW (ref 150–400)
RBC: 2.83 MIL/uL — ABNORMAL LOW (ref 4.22–5.81)
RDW: 13.5 % (ref 11.5–15.5)
WBC: 17.4 10*3/uL — ABNORMAL HIGH (ref 4.0–10.5)

## 2017-10-29 LAB — COMPREHENSIVE METABOLIC PANEL
ALT: 26 U/L (ref 17–63)
AST: 45 U/L — ABNORMAL HIGH (ref 15–41)
Albumin: 3.6 g/dL (ref 3.5–5.0)
Alkaline Phosphatase: 39 U/L (ref 38–126)
Anion gap: 8 (ref 5–15)
BUN: 25 mg/dL — ABNORMAL HIGH (ref 6–20)
CO2: 24 mmol/L (ref 22–32)
Calcium: 8.3 mg/dL — ABNORMAL LOW (ref 8.9–10.3)
Chloride: 101 mmol/L (ref 101–111)
Creatinine, Ser: 0.92 mg/dL (ref 0.61–1.24)
GFR calc Af Amer: >60 mL/min (ref >60)
GFR calc non Af Amer: >60 mL/min (ref >60)
Glucose, Bld: 137 mg/dL — ABNORMAL HIGH (ref 65–99)
Potassium: 3.4 mmol/L — ABNORMAL LOW (ref 3.5–5.1)
Sodium: 133 mmol/L — ABNORMAL LOW (ref 135–145)
Total Bilirubin: 0.7 mg/dL (ref 0.3–1.2)
Total Protein: 6.1 g/dL — ABNORMAL LOW (ref 6.5–8.1)

## 2017-10-29 LAB — GLUCOSE, CAPILLARY
Glucose-Capillary: 110 mg/dL — ABNORMAL HIGH (ref 65–99)
Glucose-Capillary: 136 mg/dL — ABNORMAL HIGH (ref 65–99)
Glucose-Capillary: 153 mg/dL — ABNORMAL HIGH (ref 65–99)
Glucose-Capillary: 179 mg/dL — ABNORMAL HIGH (ref 65–99)
Glucose-Capillary: 188 mg/dL — ABNORMAL HIGH (ref 65–99)
Glucose-Capillary: 210 mg/dL — ABNORMAL HIGH (ref 65–99)

## 2017-10-29 MED ORDER — MAGNESIUM HYDROXIDE 400 MG/5ML PO SUSP: 30.0000 mL | Freq: Every day | ORAL | Status: DC | PRN

## 2017-10-29 MED ORDER — FUROSEMIDE 10 MG/ML IJ SOLN
40.0000 mg | Freq: Every day | INTRAMUSCULAR | Status: DC
  Administered 2017-10-29: 40 mg via INTRAVENOUS
  Filled 2017-10-29: qty 4

## 2017-10-29 MED ORDER — SODIUM CHLORIDE 0.9 % IV SOLN: 250.0000 mL | INTRAVENOUS | Status: DC | PRN

## 2017-10-29 MED ORDER — MOVING RIGHT ALONG BOOK
Freq: Once | Status: AC
  Administered 2017-10-29: 14:00:00
  Filled 2017-10-29: qty 1

## 2017-10-29 MED ORDER — SODIUM CHLORIDE 0.9% FLUSH: 3.0000 mL | INTRAVENOUS | Status: DC | PRN

## 2017-10-29 MED ORDER — SORBITOL 70 % PO SOLN
60.0000 mL | Freq: Once | ORAL | Status: AC
  Administered 2017-10-29: 60 mL via ORAL
  Filled 2017-10-29: qty 60

## 2017-10-29 MED ORDER — SODIUM CHLORIDE 0.9% FLUSH
3.0000 mL | Freq: Two times a day (BID) | INTRAVENOUS | Status: DC
  Administered 2017-10-29 – 2017-10-31 (×4): 3 mL via INTRAVENOUS

## 2017-10-29 MED ORDER — INSULIN ASPART 100 UNIT/ML ~~LOC~~ SOLN
0.0000 [IU] | Freq: Three times a day (TID) | SUBCUTANEOUS | Status: DC
  Administered 2017-10-29: 4 [IU] via SUBCUTANEOUS
  Administered 2017-10-29: 8 [IU] via SUBCUTANEOUS
  Administered 2017-10-30: 4 [IU] via SUBCUTANEOUS
  Administered 2017-10-30 – 2017-10-31 (×3): 2 [IU] via SUBCUTANEOUS
  Administered 2017-10-31: 4 [IU] via SUBCUTANEOUS
  Administered 2017-10-31 – 2017-11-01 (×2): 2 [IU] via SUBCUTANEOUS

## 2017-10-29 NOTE — Progress Notes (Signed)
3 Days Post-Op Procedure(s) (LRB): AORTIC VALVE REPLACEMENT (AVR), using Magna Ease 25 (N/A) CORONARY ARTERY BYPASS GRAFTING (CABG) x two , using left internal mammary artery and right leg greater saphenous vein harvested endoscopically (N/A) TRANSESOPHAGEAL ECHOCARDIOGRAM (TEE) (N/A) Subjective: Progressing well DC tubes and tx to [address] [place]  Objective: Vital signs in last 24 hours: Temp:  [98 F (36.7 C)-98.7 F (37.1 C)] 98.6 F (37 C) (11/18 0804) Pulse Rate:  [88-106] 102 (11/18 1000) Cardiac Rhythm: Heart block (11/18 1000) Resp:  [0-27] 20 (11/18 1000) BP: (114-143)/(61-103) 130/76 (11/18 1000) SpO2:  [76 %-99 %] 97 % (11/18 1000) Weight:  [239 lb 13.8 oz (108.8 kg)] 239 lb 13.8 oz (108.8 kg) (11/18 0500)  Hemodynamic parameters for last 24 hours:    Intake/Output from previous day: 11/17 0701 - 11/18 0700 In: 783 [P.O.:720; I.V.:13; IV Piggyback:50] Out: 1610 [Urine:1150; Chest Tube:390] Intake/Output this shift: Total I/O In: 120 [P.O.:120] Out: 620 [Urine:600; Chest Tube:20]      Physical Exam  General: alert upin chair HEENT: Normocephalic pupils equal , dentition adequate Neck: Supple without JVD, adenopathy, or bruit Chest: Clear to auscultation, symmetrical breath sounds, no rhonchi, no tenderness             or deformity Cardiovascular: Regular rate and rhythm, no murmur, no gallop, peripheral pulses             palpable in all extremities Abdomen:  Soft, nontender, no palpable mass or organomegaly Extremities: Warm, well-perfused, no clubbing cyanosis edema or tenderness,              no venous stasis changes of the legs Rectal/GU: Deferred Neuro: Grossly non--focal and symmetrical throughout Skin: Clean and dry without rash or ulceration   Lab Results: Recent Labs    10/28/17 0335 10/28/17 1552 10/29/17 0510  WBC 18.2*  --  17.4*  HGB 8.6* 9.2* 8.9*  HCT 26.7* 27.0* 27.1*  PLT 94*  --  115*   BMET:  Recent Labs    10/28/17 0335  10/28/17 1552 10/29/17 0510  NA 133* 136 133*  K 3.8 4.0 3.4*  CL 103 100* 101  CO2 24  --  24  GLUCOSE 150* 204* 137*  BUN 18 23* 25*  CREATININE 1.01 1.00 0.92  CALCIUM 8.3*  --  8.3*    PT/INR:  Recent Labs    10/26/17 1432  LABPROT 17.9*  INR 1.49   ABG    Component Value Date/Time   PHART 7.341 (L) 10/27/2017 0455   HCO3 19.5 (L) 10/27/2017 0455   TCO2 24 10/28/2017 1552   ACIDBASEDEF 5.1 (H) 10/27/2017 0455   O2SAT 97.9 10/27/2017 0455   CBG (last 3)  Recent Labs    10/28/17 2356 10/29/17 0450 10/29/17 0803  GLUCAP 136* 110* 153*    Assessment/Plan: S/P Procedure(s) (LRB): AORTIC VALVE REPLACEMENT (AVR), using Magna Ease 25 (N/A) CORONARY ARTERY BYPASS GRAFTING (CABG) x two , using left internal mammary artery and right leg greater saphenous vein harvested endoscopically (N/A) TRANSESOPHAGEAL ECHOCARDIOGRAM (TEE) (N/A) Mobilize Diuresis Diabetes control tx to telemetryfollow elevated WBC-improving   LOS: 3 days    Tharon Aquas Trigt III 10/29/2017

## 2017-10-29 NOTE — Progress Notes (Signed)
CT surgery p.m. Rounds  Patient examined and record reviewed.Hemodynamics stable,labs satisfactory.Patient had stable day. Waiting for bed on 4 E.  Continue current care. Tharon Aquas Trigt III 10/29/2017

## 2017-10-30 ENCOUNTER — Inpatient Hospital Stay (HOSPITAL_COMMUNITY): Payer: PPO

## 2017-10-30 LAB — COMPREHENSIVE METABOLIC PANEL
ALT: 56 U/L (ref 17–63)
AST: 66 U/L — ABNORMAL HIGH (ref 15–41)
Albumin: 3.7 g/dL (ref 3.5–5.0)
Alkaline Phosphatase: 54 U/L (ref 38–126)
Anion gap: 10 (ref 5–15)
BUN: 22 mg/dL — ABNORMAL HIGH (ref 6–20)
CO2: 29 mmol/L (ref 22–32)
Calcium: 9 mg/dL (ref 8.9–10.3)
Chloride: 96 mmol/L — ABNORMAL LOW (ref 101–111)
Creatinine, Ser: 1 mg/dL (ref 0.61–1.24)
GFR calc Af Amer: >60 mL/min (ref >60)
GFR calc non Af Amer: >60 mL/min (ref >60)
Glucose, Bld: 149 mg/dL — ABNORMAL HIGH (ref 65–99)
Potassium: 3.2 mmol/L — ABNORMAL LOW (ref 3.5–5.1)
Sodium: 135 mmol/L (ref 135–145)
Total Bilirubin: 0.7 mg/dL (ref 0.3–1.2)
Total Protein: 6.5 g/dL (ref 6.5–8.1)

## 2017-10-30 LAB — CBC
HCT: 28 % — ABNORMAL LOW (ref 39.0–52.0)
Hemoglobin: 9.4 g/dL — ABNORMAL LOW (ref 13.0–17.0)
MCH: 31.9 pg (ref 26.0–34.0)
MCHC: 33.6 g/dL (ref 30.0–36.0)
MCV: 94.9 fL (ref 78.0–100.0)
Platelets: 150 10*3/uL (ref 150–400)
RBC: 2.95 MIL/uL — ABNORMAL LOW (ref 4.22–5.81)
RDW: 13.5 % (ref 11.5–15.5)
WBC: 14 10*3/uL — ABNORMAL HIGH (ref 4.0–10.5)

## 2017-10-30 LAB — GLUCOSE, CAPILLARY
Glucose-Capillary: 136 mg/dL — ABNORMAL HIGH (ref 65–99)
Glucose-Capillary: 172 mg/dL — ABNORMAL HIGH (ref 65–99)
Glucose-Capillary: 190 mg/dL — ABNORMAL HIGH (ref 65–99)
Glucose-Capillary: 167 mg/dL — ABNORMAL HIGH (ref 65–99)
Glucose-Capillary: 140 mg/dL — ABNORMAL HIGH (ref 65–99)

## 2017-10-30 MED ORDER — METOPROLOL SUCCINATE ER 50 MG PO TB24
50.0000 mg | ORAL_TABLET | Freq: Every day | ORAL | Status: DC
  Administered 2017-10-30 – 2017-11-01 (×3): 50 mg via ORAL
  Filled 2017-10-30 (×4): qty 1

## 2017-10-30 MED ORDER — LINAGLIPTIN 5 MG PO TABS: 5.0000 mg | ORAL_TABLET | Freq: Every day | ORAL | Status: DC

## 2017-10-30 MED ORDER — METFORMIN HCL 500 MG PO TABS: 500.0000 mg | ORAL_TABLET | Freq: Two times a day (BID) | ORAL | Status: DC

## 2017-10-30 MED ORDER — GABAPENTIN 600 MG PO TABS
600.0000 mg | ORAL_TABLET | Freq: Two times a day (BID) | ORAL | Status: DC
  Administered 2017-10-30 – 2017-11-01 (×5): 600 mg via ORAL
  Filled 2017-10-30 (×5): qty 1

## 2017-10-30 MED ORDER — ROSUVASTATIN CALCIUM 10 MG PO TABS
10.0000 mg | ORAL_TABLET | Freq: Every day | ORAL | Status: DC
  Administered 2017-10-30 – 2017-10-31 (×2): 10 mg via ORAL
  Filled 2017-10-30 (×2): qty 1

## 2017-10-30 MED ORDER — METOPROLOL TARTRATE 25 MG PO TABS: 25.0000 mg | ORAL_TABLET | Freq: Two times a day (BID) | ORAL | Status: DC

## 2017-10-30 MED ORDER — SITAGLIPTIN PHOS-METFORMIN HCL 50-1000 MG PO TABS: 1.0000 | ORAL_TABLET | Freq: Two times a day (BID) | ORAL | Status: DC

## 2017-10-30 MED ORDER — POTASSIUM CHLORIDE CRYS ER 20 MEQ PO TBCR
40.0000 meq | EXTENDED_RELEASE_TABLET | Freq: Every day | ORAL | Status: DC
  Administered 2017-10-30: 40 meq via ORAL
  Filled 2017-10-30: qty 2

## 2017-10-30 MED ORDER — LINAGLIPTIN 5 MG PO TABS: 5.0000 mg | ORAL_TABLET | Freq: Two times a day (BID) | ORAL | Status: DC

## 2017-10-30 MED ORDER — POTASSIUM CHLORIDE CRYS ER 20 MEQ PO TBCR
40.0000 meq | EXTENDED_RELEASE_TABLET | Freq: Once | ORAL | Status: AC
  Administered 2017-10-30: 40 meq via ORAL
  Filled 2017-10-30: qty 2

## 2017-10-30 MED ORDER — ASPIRIN EC 325 MG PO TBEC
325.0000 mg | DELAYED_RELEASE_TABLET | Freq: Every day | ORAL | Status: DC
  Administered 2017-10-30 – 2017-11-01 (×3): 325 mg via ORAL
  Filled 2017-10-30 (×3): qty 1

## 2017-10-30 MED ORDER — LINAGLIPTIN 5 MG PO TABS
5.0000 mg | ORAL_TABLET | Freq: Every day | ORAL | Status: DC
  Administered 2017-10-31 – 2017-11-01 (×2): 5 mg via ORAL
  Filled 2017-10-30 (×2): qty 1

## 2017-10-30 MED ORDER — METFORMIN HCL 500 MG PO TABS
1000.0000 mg | ORAL_TABLET | Freq: Two times a day (BID) | ORAL | Status: DC
  Administered 2017-10-30 – 2017-11-01 (×4): 1000 mg via ORAL
  Filled 2017-10-30 (×4): qty 2

## 2017-10-30 MED ORDER — LISINOPRIL 5 MG PO TABS
5.0000 mg | ORAL_TABLET | Freq: Every day | ORAL | Status: DC
  Administered 2017-10-30 – 2017-11-01 (×2): 5 mg via ORAL
  Filled 2017-10-30 (×3): qty 1

## 2017-10-30 MED FILL — Dexmedetomidine HCl in NaCl 0.9% IV Soln 400 MCG/100ML: INTRAVENOUS | Qty: 100 | Status: AC

## 2017-10-30 MED FILL — Magnesium Sulfate Inj 50%: INTRAMUSCULAR | Qty: 10 | Status: AC

## 2017-10-30 MED FILL — Potassium Chloride Inj 2 mEq/ML: INTRAVENOUS | Qty: 20 | Status: AC

## 2017-10-30 MED FILL — Heparin Sodium (Porcine) Inj 1000 Unit/ML: INTRAMUSCULAR | Qty: 30 | Status: AC

## 2017-10-30 NOTE — Progress Notes (Signed)
CARDIAC REHAB PHASE I   PRE:  Rate/Rhythm: 107 first deg    BP: sitting 121/67    SaO2: 98 RA  MODE:  Ambulation: 370 ft   POST:  Rate/Rhythm: 132 first deg    BP: sitting 120/73     SaO2: 98 RA  Pt feeling fairly well. Able to stand independently. Attempted walking without RW but small steps and tense so he used RW in hall. Steady, slow pace. HR increased to 132 first deg. Pt fatigued with distance, rest x1. Return to recliner.Pt doing well on IS, up to 2000 mL at times. Ed completed with pt and wife with good reception. Will refer to [facility]. Gave instructions for d/c video. [id number]   [place], ACSM 10/30/2017 10:14 AM

## 2017-10-30 NOTE — Care Management Important Message (Signed)
Important Message  Patient Details  Name: Theodore Jenkins MRN: [id number] Date of Birth: 29-Jun-1946   Medicare Important Message Given:  Yes    Nathen May 10/30/2017, 10:23 AM

## 2017-10-30 NOTE — Progress Notes (Signed)
Pt ambulating hall with wife, using RW.

## 2017-10-30 NOTE — Plan of Care (Signed)
Able to ambulate in hall way 2 times a day.

## 2017-10-30 NOTE — Progress Notes (Addendum)
      [address]       [place] [address]             [phone]        4 Days Post-Op Procedure(s) (LRB): AORTIC VALVE REPLACEMENT (AVR), using Magna Ease 25 (N/A) CORONARY ARTERY BYPASS GRAFTING (CABG) x two , using left internal mammary artery and right leg greater saphenous vein harvested endoscopically (N/A) TRANSESOPHAGEAL ECHOCARDIOGRAM (TEE) (N/A)  Subjective: Patient with loose stools yesterday.  Objective: Vital signs in last 24 hours: Temp:  [98 F (36.7 C)-99.1 F (37.3 C)] 98.1 F (36.7 C) (11/19 0408) Pulse Rate:  [90-123] 90 (11/19 0408) Cardiac Rhythm: Heart block;Normal sinus rhythm;Bundle branch block;Other (Comment) (11/18 2300) Resp:  [15-23] 20 (11/19 0408) BP: (116-157)/(64-103) 138/80 (11/19 0408) SpO2:  [93 %-100 %] 100 % (11/19 0408) Weight:  [232 lb (105.2 kg)-234 lb 6.4 oz (106.3 kg)] 232 lb (105.2 kg) (11/19 0500)  Pre op weight 106.6 kg Current Weight  10/30/17 232 lb (105.2 kg)      Intake/Output from previous day: 11/18 0701 - 11/19 0700 In: 480 [P.O.:480] Out: 5115 [Urine:5040; Chest Tube:75]   Physical Exam:  Cardiovascular: Tachycardic Pulmonary: Slightly diminished at bases Abdomen: Soft, obese,non tender, bowel sounds present. Extremities: Mild bilateral lower extremity edema. Wounds: Aquacel dressing removed and sternal wound is clean and dry.  No erythema or signs of infection. RLE wound clean and dry.  Lab Results: CBC: Recent Labs    10/29/17 0510 10/30/17 0323  WBC 17.4* 14.0*  HGB 8.9* 9.4*  HCT 27.1* 28.0*  PLT 115* 150   BMET:  Recent Labs    10/29/17 0510 10/30/17 0323  NA 133* 135  K 3.4* 3.2*  CL 101 96*  CO2 24 29  GLUCOSE 137* 149*  BUN 25* 22*  CREATININE 0.92 1.00  CALCIUM 8.3* 9.0    PT/INR:  Lab Results  Component Value Date   INR 1.49 10/26/2017   INR 1.04 10/23/2017   INR 1.0 08/18/2017   ABG:  INR: Will add last result for INR, ABG once components are  confirmed Will add last 4 CBG results once components are confirmed  Assessment/Plan:  1. CV - ST, first degree heart block in the 110's this am. On Lopressor 12.5 mg bid. Will increase to 25 mg bid but monitor as has first degree heart block. 2.  Pulmonary - On room air. Encourage incentive spirometer. 3. Volume Overload - On Lasix 40 mg IV daily. 4.  Acute blood loss anemia - H and H 9.4 and 28 5. DM-CBGs 179/188/136. On Insulin only. Will stop scheduled Insulin and restart reduced Janumet . 6. Supplement potassium 7. Thrombocytopenia resolved as platelets up to 150,000 8. Stop stool softeners 9. Remove EPW in am  Donielle M ZimmermanPA-C 10/30/2017,8:02 AM  I have seen and examined the patient and agree with the assessment and plan as outlined.  Looks good.  Increase beta blocker and restart ACE-I.  Restart home diabetic meds.  D/C pacing wires.  Mobilize.  Supplement potassium.  Possible d/c home 1-2 days  Rexene Alberts, MD 10/30/2017 8:39 AM

## 2017-10-30 NOTE — Plan of Care (Signed)
  Clinical Measurements: Ability to maintain clinical measurements within normal limits will improve 10/30/2017 1459 - Progressing by Isabella Bowens, RN   Clinical Measurements: Cardiovascular complication will be avoided 10/30/2017 1459 - Progressing by Isabella Bowens, RN

## 2017-10-30 NOTE — Progress Notes (Signed)
EPWs removed per order and unit protocol.  Pt tolerated well.  All tips intact, sites unremarkable.  Pt and wife understand bedrest for one hr with frequent VS.  Will con't plan of care.

## 2017-10-30 NOTE — Progress Notes (Signed)
Pt ambulating hall with wife using RW. 

## 2017-10-30 NOTE — Discharge Summary (Signed)
Physician Discharge Summary  Patient ID: BRENNER VISCONTI MRN: [id number] DOB/AGE: 69-Jul-1947 69 y.o.  Admit date: 10/26/2017 Discharge date: 11/01/2017  Admission Diagnoses: Severe coronary artery disease and aortic stenosis  Discharge Diagnoses:  Principal Problem:   S/P aortic valve replacement with bioprosthetic valve + CABG x2 Active Problems:   CAD (coronary artery disease)   HTN (hypertension)   Obesities, morbid (HCC)   Aortic stenosis   Exertional chest pain   Type 2 diabetes mellitus with complication, without long-term current use of insulin (HCC)   S/P CABG x 2  Patient Active Problem List   Diagnosis Date Noted  . S/P aortic valve replacement with bioprosthetic valve + CABG x2 10/26/2017  . S/P CABG x 2 10/26/2017  . Type 2 diabetes mellitus with complication, without long-term current use of insulin ([facility])   . Accelerating angina ([facility]) 08/18/2017  . Carotid artery disease ([facility]) 08/08/2016  . Exertional chest pain 10/03/2014  . Aortic stenosis 05/03/2014  . ED (erectile dysfunction) 03/20/2012  . CAD (coronary artery disease) 06/06/2011  . Hyperlipidemia LDL goal <70 06/06/2011  . HTN (hypertension) 06/06/2011  . Obesities, morbid ([facility]) 06/06/2011   Discharged Condition: good  Hospital Course: The patient was admitted electively and on 10/26/2017 taken to the operating room where he underwent the below described procedure.  He tolerated well and was taken to the surgical intensive care unit in stable condition.  Postoperative hospital course: The patient has overall progressed quite nicely.  He was weaned from the ventilator without difficulty using standard protocols.  He has maintained stable hemodynamics in sinus rhythm with some sinus tachycardia.  He has been started on beta-blocker.  He does have some postoperative volume overload but has responded to diuretics.  All routine lines, monitors and drainage devices have been discontinued in the standard fashion.  He  is having expected acute blood loss anemia which is stabilized.  He did have some postoperative nausea which responded to Zofran and a short course of Reglan.  Renal function has remained within normal incision is healing well without evidence of infection.  Oxygen has been weaned and maintained good saturations on room air.  He is tolerating gradually increasing activities using standard cardiac rehab protocols.  Blood sugars have been under adequate control and Janumet is restarted and may require further dosage adjustments.  At time of discharge the patient is felt to be quite stable.   Consults: None  Significant Diagnostic Studies: Routine postoperative laboratory and serial chest x-rays  Treatments: surgery:             CARDIOTHORACIC SURGERY OPERATIVE NOTE  Date of Procedure:                10/26/2017  Preoperative Diagnosis:       ? Severe Aortic Stenosis ? Severe Multi-vessel Coronary Artery Disease  Postoperative Diagnosis:    Same  Procedure:        Aortic Valve Replacement             Edwards Magna Ease Pericardial Tissue Valve (size 68mm, model # 3300TFX, serial # [id number])   Coronary Artery Bypass Grafting x 2             Left Internal Mammary Artery to Distal Left Anterior Descending Coronary Artery             Saphenous Vein Graft to First Obtuse Marginal Branch of Left Circumflex Coronary Artery             Endoscopic  Vein Harvest from Right Thigh   Surgeon:        Valentina Gu. Roxy Manns, MD  Assistant:       Nicholes Rough, PA-C  Anesthesia:    Midge Minium, MD  Operative Findings: ? Severe aortic stenosis ? Normal left ventricular systolic function ? Good quality left internal mammary artery conduit ? Good quality saphenous vein conduit ? Small caliber fair quality target vessels for grafting          Discharge Exam: Blood pressure 118/67, pulse 95, temperature 97.8 F (36.6 C), temperature source Oral, resp. rate 16, height 5\' 9"   (1.753 m), weight 225 lb 9.6 oz (102.3 kg), SpO2 96 %.   General appearance: alert, cooperative and no distress Heart: sinus tachycardia Lungs: clear to auscultation bilaterally Abdomen: soft, non-tender; bowel sounds normal; no masses,  no organomegaly Extremities: extremities normal, atraumatic, no cyanosis or edema Wound: clean and dry    Disposition: 01-Home or Self Care  Discharge Instructions    Amb Referral to Cardiac Rehabilitation   Complete by:  As directed    Diagnosis:   CABG Valve Replacement     Valve:  Aortic   CABG X ___:  2   Discharge patient   Complete by:  As directed    Discharge disposition:  01-Home or Self Care   Discharge patient date:  11/01/2017     Allergies as of 11/01/2017      Reactions   Oxycodone Nausea And Vomiting      Medication List    STOP taking these medications   aspirin 81 MG tablet Replaced by:  aspirin 325 MG EC tablet   nitroGLYCERIN 0.4 MG SL tablet Commonly known as:  NITROSTAT     TAKE these medications   acetaminophen 500 MG tablet Commonly known as:  TYLENOL Take 1,000 mg by mouth every 8 (eight) hours as needed for headache.   aspirin 325 MG EC tablet Take 1 tablet (325 mg total) by mouth daily. Start taking on:  11/02/2017 Replaces:  aspirin 81 MG tablet   cetirizine 10 MG tablet Commonly known as:  ZYRTEC Take 10 mg by mouth daily.   FLONASE 50 MCG/ACT nasal spray Generic drug:  fluticasone Place 2 sprays into both nostrils daily as needed for allergies or rhinitis.   gabapentin 300 MG capsule Commonly known as:  NEURONTIN Take 600 mg by mouth 2 (two) times daily.   lisinopril 10 MG tablet Commonly known as:  PRINIVIL,ZESTRIL Take 0.5 tablets (5 mg total) by mouth daily.   metoprolol succinate 50 MG 24 hr tablet Commonly known as:  TOPROL-XL TAKE 1 TABLET BY MOUTH  DAILY WITH A MEAL OR  IMMEDIATELY FOLLOWING A  MEAL What changed:  See the new instructions.   omeprazole 20 MG tablet Commonly  known as:  PRILOSEC OTC Take 20 mg by mouth daily.   ONE-A-DAY 50 PLUS PO Take 1 tablet by mouth daily.   rosuvastatin 10 MG tablet Commonly known as:  CRESTOR TAKE 1 TABLET BY MOUTH  DAILY. What changed:    how much to take  how to take this  when to take this   sitaGLIPtin-metformin 50-1000 MG tablet Commonly known as:  JANUMET Take 1 tablet by mouth 2 (two) times daily with a meal.   traMADol 50 MG tablet Commonly known as:  ULTRAM Take 1 tablet (50 mg total) by mouth every 4 (four) hours as needed for moderate pain.      Follow-up Information  Rexene Alberts, MD. Go on 12/11/2017.   Specialty:  Cardiothoracic Surgery Why:  PA/ALT CXR to be taken (at [facility] which is in the same building as Dr. Guy Sandifer office) on 12/11/2017 at 10:00 am ;Appointment time is at 10:30 am Contact information: [address] [place] [address] [phone]        Karlene Einstein, MD. Call.   Specialty:  Family Medicine Why:  for a follow up appointment regarding further surveillance of HGA1C 7.4 Contact information: [address] [place] [address] [place], [place], [place]. Go on 11/15/2017.   Specialty:  Cardiology Why:  Appointment time is at 8:00 am Contact information: [place] [address] [phone]          The patient has been discharged on:   1.Beta Blocker:  Yes [  y ]                              No   [   ]                              If No, reason:  2.Ace Inhibitor/ARB: Yes [  y ]                                     No  [    ]                                     If No, reason:  3.Statin:   Yes [ y  ]                  No  [   ]                  If No, reason:  4.Ecasa:  Yes  [ y  ]                  No   [   ]                  If No, reason:  Signed: Elgie Collard 11/01/2017, 8:58 AM

## 2017-10-31 LAB — BASIC METABOLIC PANEL
Anion gap: 8 (ref 5–15)
BUN: 24 mg/dL — ABNORMAL HIGH (ref 6–20)
CO2: 28 mmol/L (ref 22–32)
Calcium: 8.8 mg/dL — ABNORMAL LOW (ref 8.9–10.3)
Chloride: 95 mmol/L — ABNORMAL LOW (ref 101–111)
Creatinine, Ser: 1.06 mg/dL (ref 0.61–1.24)
GFR calc Af Amer: >60 mL/min (ref >60)
GFR calc non Af Amer: >60 mL/min (ref >60)
Glucose, Bld: 158 mg/dL — ABNORMAL HIGH (ref 65–99)
Potassium: 3.2 mmol/L — ABNORMAL LOW (ref 3.5–5.1)
Sodium: 131 mmol/L — ABNORMAL LOW (ref 135–145)

## 2017-10-31 LAB — GLUCOSE, CAPILLARY
Glucose-Capillary: 141 mg/dL — ABNORMAL HIGH (ref 65–99)
Glucose-Capillary: 171 mg/dL — ABNORMAL HIGH (ref 65–99)
Glucose-Capillary: 113 mg/dL — ABNORMAL HIGH (ref 65–99)
Glucose-Capillary: 137 mg/dL — ABNORMAL HIGH (ref 65–99)

## 2017-10-31 MED ORDER — POTASSIUM CHLORIDE CRYS ER 20 MEQ PO TBCR: 40.0000 meq | EXTENDED_RELEASE_TABLET | Freq: Every day | ORAL | Status: DC

## 2017-10-31 MED ORDER — POTASSIUM CHLORIDE CRYS ER 20 MEQ PO TBCR
40.0000 meq | EXTENDED_RELEASE_TABLET | Freq: Three times a day (TID) | ORAL | Status: AC
  Administered 2017-10-31 (×3): 40 meq via ORAL
  Filled 2017-10-31 (×3): qty 2

## 2017-10-31 NOTE — Progress Notes (Addendum)
[address]       [place] [address]             [phone]      5 Days Post-Op Procedure(s) (LRB): AORTIC VALVE REPLACEMENT (AVR), using Magna Ease 25 (N/A) CORONARY ARTERY BYPASS GRAFTING (CABG) x two , using left internal mammary artery and right leg greater saphenous vein harvested endoscopically (N/A) TRANSESOPHAGEAL ECHOCARDIOGRAM (TEE) (N/A) Subjective: Feels pretty well overall  Objective: Vital signs in last 24 hours: Temp:  [98.1 F (36.7 C)-98.8 F (37.1 C)] 98.1 F (36.7 C) (11/20 0552) Pulse Rate:  [83-106] 86 (11/19 2321) Cardiac Rhythm: Sinus tachycardia (11/20 0700) Resp:  [14-24] 16 (11/20 0552) BP: (95-118)/(60-88) 107/88 (11/20 0552) SpO2:  [98 %-100 %] 100 % (11/20 0552) Weight:  [228 lb 14.4 oz (103.8 kg)] 228 lb 14.4 oz (103.8 kg) (11/20 0552)  Hemodynamic parameters for last 24 hours:    Intake/Output from previous day: 11/19 0701 - 11/20 0700 In: 690 [P.O.:690] Out: -  Intake/Output this shift: No intake/output data recorded.  General appearance: alert, cooperative and no distress Heart: regular rate and rhythm and soft systolic murmur, tachy Lungs: clear to auscultation bilaterally Abdomen: benign exam Extremities: minor edema Wound: incis healing well  Lab Results: Recent Labs    10/29/17 0510 10/30/17 0323  WBC 17.4* 14.0*  HGB 8.9* 9.4*  HCT 27.1* 28.0*  PLT 115* 150   BMET:  Recent Labs    10/30/17 0323 10/31/17 0222  NA 135 131*  K 3.2* 3.2*  CL 96* 95*  CO2 29 28  GLUCOSE 149* 158*  BUN 22* 24*  CREATININE 1.00 1.06  CALCIUM 9.0 8.8*    PT/INR: No results for input(s): LABPROT, INR in the last 72 hours. ABG    Component Value Date/Time   PHART 7.341 (L) 10/27/2017 0455   HCO3 19.5 (L) 10/27/2017 0455   TCO2 24 10/28/2017 1552   ACIDBASEDEF 5.1 (H) 10/27/2017 0455   O2SAT 97.9 10/27/2017 0455   CBG (last 3)  Recent Labs    10/30/17 1633 10/30/17 2140 10/31/17 0556  GLUCAP 167*  140* 141*    Meds Scheduled Meds: . acetaminophen  1,000 mg Oral Q6H  . aspirin EC  325 mg Oral Daily  . chlorhexidine gluconate (MEDLINE KIT)  15 mL Mouth Rinse BID  . gabapentin  600 mg Oral BID  . insulin aspart  0-24 Units Subcutaneous TID AC & HS  . linagliptin  5 mg Oral Q breakfast   And  . metFORMIN  1,000 mg Oral BID WC  . lisinopril  5 mg Oral Daily  . mouth rinse  15 mL Mouth Rinse BID  . metoprolol succinate  50 mg Oral Daily  . pantoprazole  40 mg Oral Daily  . [START ON 11/01/2017] potassium chloride  40 mEq Oral Daily  . potassium chloride  40 mEq Oral TID  . rosuvastatin  10 mg Oral q1800  . sodium chloride flush  10-40 mL Intracatheter Q12H  . sodium chloride flush  3 mL Intravenous Q12H   Continuous Infusions: . sodium chloride    . sodium chloride     PRN Meds:.sodium chloride, magnesium hydroxide, metoprolol tartrate, ondansetron (ZOFRAN) IV, oxyCODONE, sodium chloride flush, sodium chloride flush, traMADol  Xrays Dg Chest 2 View  Result Date: 10/30/2017 CLINICAL DATA:  Status post CABG and aortic valve replacement 4 days ago. EXAM: CHEST  2 VIEW COMPARISON:  Portable chest x-ray of October 29, 2017 FINDINGS:  The lungs are adequately inflated and clear. There is no pneumothorax or pleural effusion. The left chest tube and the mediastinal drain have been removed. The cardiac silhouette remains enlarged. The pulmonary vascularity is not engorged. The sternal wires are intact. The aortic valve cage is visible. The gas pattern in the upper abdomen is normal. The right internal jugular Cordis sheath has been removed. IMPRESSION: No pneumonia,pulmonary edema, pleural effusion, or pneumothorax. Stable mild cardiac enlargement without pulmonary vascular congestion. Electronically Signed   By: David  Martinique M.D.   On: 10/30/2017 07:19    Assessment/Plan: S/P Procedure(s) (LRB): AORTIC VALVE REPLACEMENT (AVR), using Magna Ease 25 (N/A) CORONARY ARTERY BYPASS GRAFTING  (CABG) x two , using left internal mammary artery and right leg greater saphenous vein harvested endoscopically (N/A) TRANSESOPHAGEAL ECHOCARDIOGRAM (TEE) (N/A)  1 steady overall progress 2 tachy at times to 120's, mostly up with activity, betablocker increased yesterday. May need to increase further, see how he does with am dose.  BP mostly tolerating ACE-I with systolic 29-090 but wont increase to home dose yet/ Replace K+ 3 sugars adeq controlled- home meds restarted, needs aggressive dietary management with A1C over 7 since 2013 4 pos home later today or in am  LOS: 5 days    John Giovanni 10/31/2017   I have seen and examined the patient and agree with the assessment and plan as outlined.  Tentatively plan d/c home in am tomorrow  Rexene Alberts, MD 10/31/2017 2:52 PM

## 2017-10-31 NOTE — Progress Notes (Signed)
CARDIAC REHAB PHASE I  Pt in bed, states he ambulated twice, declines ambulation at this time, states he would like to rest, states he will walk again independently. Education completed yesterday, will sign off at this time.   Lenna Sciara, RN, BSN 10/31/2017 1:54 PM

## 2017-11-01 LAB — GLUCOSE, CAPILLARY: Glucose-Capillary: 149 mg/dL — ABNORMAL HIGH (ref 65–99)

## 2017-11-01 MED ORDER — TRAMADOL HCL 50 MG PO TABS: 50.0000 mg | ORAL_TABLET | ORAL | 0 refills | Status: DC | PRN

## 2017-11-01 MED ORDER — POTASSIUM CHLORIDE ER 10 MEQ PO TBCR: 40.0000 meq | EXTENDED_RELEASE_TABLET | Freq: Once | ORAL | Status: DC

## 2017-11-01 MED ORDER — ASPIRIN 325 MG PO TBEC: 325.0000 mg | DELAYED_RELEASE_TABLET | Freq: Every day | ORAL | 0 refills | Status: DC

## 2017-11-01 NOTE — Progress Notes (Signed)
[name] to be D/C'd Home per MD order. Discussed with the patient and all questions fully answered.    VVS, Skin clean, dry and intact without evidence of skin break down, no evidence of skin tears noted.  IV catheter discontinued intact. Site without signs and symptoms of complications. Dressing and pressure applied.  An After Visit Summary was printed and given to the patient.  Patient escorted via [facility], and D/C home via private auto.  Cyndra Numbers  11/01/2017 10:43 AM

## 2017-11-01 NOTE — Progress Notes (Signed)
      [address]       [place] [address]             [phone]      6 Days Post-Op Procedure(s) (LRB): AORTIC VALVE REPLACEMENT (AVR), using Magna Ease 25 (N/A) CORONARY ARTERY BYPASS GRAFTING (CABG) x two , using left internal mammary artery and right leg greater saphenous vein harvested endoscopically (N/A) TRANSESOPHAGEAL ECHOCARDIOGRAM (TEE) (N/A) Subjective: Feel good this morning. Ready to go home.   Objective: Vital signs in last 24 hours: Temp:  [97.8 F (36.6 C)-99 F (37.2 C)] 97.8 F (36.6 C) (11/21 0554) Pulse Rate:  [95-101] 95 (11/21 0554) Cardiac Rhythm: Heart block;Bundle branch block (11/20 2029) Resp:  [16-21] 16 (11/21 0554) BP: (112-118)/(57-73) 118/67 (11/21 0554) SpO2:  [96 %-99 %] 96 % (11/21 0554) Weight:  [225 lb 9.6 oz (102.3 kg)] 225 lb 9.6 oz (102.3 kg) (11/21 0554)     Intake/Output from previous day: 11/20 0701 - 11/21 0700 In: 1080 [P.O.:1080] Out: -  Intake/Output this shift: No intake/output data recorded.  General appearance: alert, cooperative and no distress Heart: sinus tachycardia Lungs: clear to auscultation bilaterally Abdomen: soft, non-tender; bowel sounds normal; no masses,  no organomegaly Extremities: extremities normal, atraumatic, no cyanosis or edema Wound: clean and dry  Lab Results: Recent Labs    10/30/17 0323  WBC 14.0*  HGB 9.4*  HCT 28.0*  PLT 150   BMET:  Recent Labs    10/30/17 0323 10/31/17 0222  NA 135 131*  K 3.2* 3.2*  CL 96* 95*  CO2 29 28  GLUCOSE 149* 158*  BUN 22* 24*  CREATININE 1.00 1.06  CALCIUM 9.0 8.8*    PT/INR: No results for input(s): LABPROT, INR in the last 72 hours. ABG    Component Value Date/Time   PHART 7.341 (L) 10/27/2017 0455   HCO3 19.5 (L) 10/27/2017 0455   TCO2 24 10/28/2017 1552   ACIDBASEDEF 5.1 (H) 10/27/2017 0455   O2SAT 97.9 10/27/2017 0455   CBG (last 3)  Recent Labs    10/31/17 1625 10/31/17 2128 11/01/17 0556  GLUCAP 113*  137* 149*    Assessment/Plan: S/P Procedure(s) (LRB): AORTIC VALVE REPLACEMENT (AVR), using Magna Ease 25 (N/A) CORONARY ARTERY BYPASS GRAFTING (CABG) x two , using left internal mammary artery and right leg greater saphenous vein harvested endoscopically (N/A) TRANSESOPHAGEAL ECHOCARDIOGRAM (TEE) (N/A)  1. CV-NSR in the 90s-110s, taking Toprol XL and Lisinopril. Continue Crestor.  2. Pulm-CPAP at night, room air during the day with good oxygen saturation. 3. Renal-1.06 creatinine, potassium replaced. Making good urine, weight continues to trend down. 4. H and H stable, expected acute blood loss anemia, platelets trending up 5. Blood glucose well controlled.  Plan: Home today.    LOS: 6 days    Elgie Collard 11/01/2017

## 2017-11-01 NOTE — Care Management Note (Signed)
Case Management Note Original Note Created Zenon Mayo, RN 10/27/2017, 4:53 PM  Patient Details  Name: DEQUAVIUS KUHNER MRN: [id number] Date of Birth: 1946/04/19  Subjective/Objective:   POD 1 CABG, AVR, cont with chest tubes.                   Action/Plan: NCM will follow for dc needs.   Expected Discharge Date:  11/01/17               Expected Discharge Plan:  Home/Self Care  In-House Referral:  NA  Discharge planning Services  CM Consult  Post Acute Care Choice:  NA Choice offered to:  NA  DME Arranged:  N/A DME Agency:  NA  HH Arranged:  NA HH Agency:  NA  Status of Service:  Completed, signed off  If discussed at [place] of Stay Meetings, dates discussed:    Discharge Disposition: home/self pay   Additional Comments:  11/01/17- 0945- Marvetta Gibbons RN, CM- pt for d/c home today- no CM needs noted for discharge.   Dahlia Client [place], RN 11/01/2017, 9:45 AM [phone]

## 2017-11-01 NOTE — Discharge Instructions (Signed)
Aortic Valve Replacement, Care After  Refer to this sheet in the next few weeks. These instructions provide you with information about caring for yourself after your procedure. Your health care provider may also give you more specific instructions. Your treatment has been planned according to current medical practices, but problems sometimes occur. Call your health care provider if you have any problems or questions after your procedure. What can I expect after the procedure? After the procedure, it is common to have: Pain around your incision area. A small amount of blood or clear fluid coming from your incision.  Follow these instructions at home: Eating and drinking  Follow instructions from your health care provider about eating or drinking restrictions. Limit alcohol intake to no more than 1 drink per day for nonpregnant women and 2 drinks per day for men. One drink equals 12 oz of beer, 5 oz of wine, or 1 oz of hard liquor. Limit how much caffeine you drink. Caffeine can affect your heart's rate and rhythm. Drink enough fluid to keep your urine clear or pale yellow. Eat a heart-healthy diet. This should include plenty of fresh fruits and vegetables. If you eat meat, it should be lean cuts. Avoid foods that are: High in salt, saturated fat, or sugar. Canned or highly processed. [place]. Activity Return to your normal activities as told by your health care provider. Ask your health care provider what activities are safe for you. Exercise regularly once you have recovered, as told by your health care provider. Avoid sitting for more than 2 hours at a time without moving. Get up and move around at least once every 1-2 hours. This helps to prevent blood clots in the legs. Do not lift anything that is heavier than 10 lb (4.5 kg) until your health care provider approves. Avoid pushing or pulling things with your arms until your health care provider approves. This includes pulling on handrails  to help you climb stairs. Incision care  Follow instructions from your health care provider about how to take care of your incision. Make sure you: Wash your hands with soap and water before you change your bandage (dressing). If soap and water are not available, use hand sanitizer. Change your dressing as told by your health care provider. Leave stitches (sutures), skin glue, or adhesive strips in place. These skin closures may need to stay in place for 2 weeks or longer. If adhesive strip edges start to loosen and curl up, you may trim the loose edges. Do not remove adhesive strips completely unless your health care provider tells you to do that. Check your incision area every day for signs of infection. Check for: More redness, swelling, or pain. More fluid or blood. Warmth. Pus or a bad smell. Medicines Take over-the-counter and prescription medicines only as told by your health care provider. If you were prescribed an antibiotic medicine, take it as told by your health care provider. Do not stop taking the antibiotic even if you start to feel better. Travel Avoid airplane travel for as long as told by your health care provider. When you travel, bring a list of your medicines and a record of your medical history with you. Carry your medicines with you. Driving Ask your health care provider when it is safe for you to drive. Do not drive until your health care provider approves. Do not drive or operate heavy machinery while taking prescription pain medicine. Lifestyle  Do not use any tobacco products, such as cigarettes, chewing tobacco,  or e-cigarettes. If you need help quitting, ask your health care provider. Resume sexual activity as told by your health care provider. Do not use medicines for erectile dysfunction unless your health care provider approves, if this applies. Work with your health care provider to keep your blood pressure and cholesterol under control, and to manage any  other heart conditions that you have. Maintain a healthy weight. General instructions Do not take baths, swim, or use a hot tub until your health care provider approves. Do not strain to have a bowel movement. Avoid crossing your legs while sitting down. Check your temperature every day for a fever. A fever may be a sign of infection. If you are a woman and you plan to become pregnant, talk with your health care provider before you become pregnant. Wear compression stockings if your health care provider instructs you to do this. These stockings help to prevent blood clots and reduce swelling in your legs. Tell all health care providers who care for you that you have an artificial (prosthetic) aortic valve. If you have or have had heart disease or endocarditis, tell all health care providers about these conditions as well. Keep all follow-up visits as told by your health care provider. This is important. Contact a health care provider if: You develop a skin rash. You experience sudden, unexplained changes in your weight. You have more redness, swelling, or pain around your incision. You have more fluid or blood coming from your incision. Your incision feels warm to the touch. You have pus or a bad smell coming from your incision. You have a fever. Get help right away if: You develop chest pain that is different from the pain coming from your incision. You develop shortness of breath or difficulty breathing. You start to feel light-headed. These symptoms may represent a serious problem that is an emergency. Do not wait to see if the symptoms will go away. Get medical help right away. Call your local emergency services (911 in the [place] Do not drive yourself to the hospital. This information is not intended to replace advice given to you by your health care provider. Make sure you discuss any questions you have with your health care provider. Document Released: 06/16/2005 Document Revised:  05/05/2016 Document Reviewed: 11/01/2015 Elsevier Interactive Patient Education  2017 [employer] Coronary Artery Bypass Grafting, Care After These instructions give you information on caring for yourself after your procedure. Your doctor may also give you more specific instructions. Call your doctor if you have any problems or questions after your procedure. Follow these instructions at home:  Only take medicine as told by your doctor. Take medicines exactly as told. Do not stop taking medicines or start any new medicines without talking to your doctor first.  Take your pulse as told by your doctor.  Do deep breathing as told by your doctor. Use your breathing device (incentive spirometer), if given, to practice deep breathing several times a day. Support your chest with a pillow or your arms when you take deep breaths or cough.  Keep the area clean, dry, and protected where the surgery cuts (incisions) were made. Remove bandages (dressings) only as told by your doctor. If strips were applied to surgical area, do not take them off. They fall off on their own.  Check the surgery area daily for puffiness (swelling), redness, or leaking fluid.  If surgery cuts were made in your legs: ? Avoid crossing your legs. ? Avoid sitting for long periods of time.  Change positions every 30 minutes. ? Raise your legs when you are sitting. Place them on pillows.  Wear stockings that help keep blood clots from forming in your legs (compression stockings).  Only take sponge baths until your doctor says it is okay to take showers. Pat the surgery area dry. Do not rub the surgery area with a washcloth or towel. Do not bathe, swim, or use a hot tub until your doctor says it is okay.  Eat foods that are high in fiber. These include raw fruits and vegetables, whole grains, beans, and nuts. Choose lean meats. Avoid canned, processed, and fried foods.  Drink enough fluids to keep your pee (urine) clear or pale  yellow.  Weigh yourself every day.  Rest and limit activity as told by your doctor. You may be told to: ? Stop any activity if you have chest pain, shortness of breath, changes in heartbeat, or dizziness. Get help right away if this happens. ? Move around often for short amounts of time or take short walks as told by your doctor. Gradually become more active. You may need help to strengthen your muscles and build endurance. ? Avoid lifting, pushing, or pulling anything heavier than 10 pounds (4.5 kg) for at least 6 weeks after surgery.  Do not drive until your doctor says it is okay.  Ask your doctor when you can go back to work.  Ask your doctor when you can begin sexual activity again.  Follow up with your doctor as told. Contact a doctor if:  You have puffiness, redness, more pain, or fluid draining from the incision site.  You have a fever.  You have puffiness in your ankles or legs.  You have pain in your legs.  You gain 2 or more pounds (0.9 kg) a day.  You feel sick to your stomach (nauseous) or throw up (vomit).  You have watery poop (diarrhea). Get help right away if:  You have chest pain that goes to your jaw or arms.  You have shortness of breath.  You have a fast or irregular heartbeat.  You notice a "clicking" in your breastbone when you move.  You have numbness or weakness in your arms or legs.  You feel dizzy or light-headed. This information is not intended to replace advice given to you by your health care provider. Make sure you discuss any questions you have with your health care provider. Document Released: 12/03/2013 Document Revised: 05/05/2016 Document Reviewed: 05/07/2013 Elsevier Interactive Patient Education  2017 [employer].  Aortic Valve Replacement, Care After Refer to this sheet in the next few weeks. These instructions provide you with information about caring for yourself after your procedure. Your health care provider may also give  you more specific instructions. Your treatment has been planned according to current medical practices, but problems sometimes occur. Call your health care provider if you have any problems or questions after your procedure. What can I expect after the procedure? After the procedure, it is common to have:  Pain around your incision area.  A small amount of blood or clear fluid coming from your incision.  Follow these instructions at home: Eating and drinking   Follow instructions from your health care provider about eating or drinking restrictions. ? Limit alcohol intake to no more than 1 drink per day for nonpregnant women and 2 drinks per day for men. One drink equals 12 oz of beer, 5 oz of wine, or 1 oz of hard liquor. ? Limit how  much caffeine you drink. Caffeine can affect your heart's rate and rhythm.  Drink enough fluid to keep your urine clear or pale yellow.  Eat a heart-healthy diet. This should include plenty of fresh fruits and vegetables. If you eat meat, it should be lean cuts. Avoid foods that are: ? High in salt, saturated fat, or sugar. ? Canned or highly processed. ? Fried. Activity  Return to your normal activities as told by your health care provider. Ask your health care provider what activities are safe for you.  Exercise regularly once you have recovered, as told by your health care provider.  Avoid sitting for more than 2 hours at a time without moving. Get up and move around at least once every 1-2 hours. This helps to prevent blood clots in the legs.  Do not lift anything that is heavier than 10 lb (4.5 kg) until your health care provider approves.  Avoid pushing or pulling things with your arms until your health care provider approves. This includes pulling on handrails to help you climb stairs. Incision care   Follow instructions from your health care provider about how to take care of your incision. Make sure you: ? Wash your hands with soap and  water before you change your bandage (dressing). If soap and water are not available, use hand sanitizer. ? Change your dressing as told by your health care provider. ? Leave stitches (sutures), skin glue, or adhesive strips in place. These skin closures may need to stay in place for 2 weeks or longer. If adhesive strip edges start to loosen and curl up, you may trim the loose edges. Do not remove adhesive strips completely unless your health care provider tells you to do that.  Check your incision area every day for signs of infection. Check for: ? More redness, swelling, or pain. ? More fluid or blood. ? Warmth. ? Pus or a bad smell. Medicines  Take over-the-counter and prescription medicines only as told by your health care provider.  If you were prescribed an antibiotic medicine, take it as told by your health care provider. Do not stop taking the antibiotic even if you start to feel better. Travel  Avoid airplane travel for as long as told by your health care provider.  When you travel, bring a list of your medicines and a record of your medical history with you. Carry your medicines with you. Driving  Ask your health care provider when it is safe for you to drive. Do not drive until your health care provider approves.  Do not drive or operate heavy machinery while taking prescription pain medicine. Lifestyle   Do not use any tobacco products, such as cigarettes, chewing tobacco, or e-cigarettes. If you need help quitting, ask your health care provider.  Resume sexual activity as told by your health care provider. Do not use medicines for erectile dysfunction unless your health care provider approves, if this applies.  Work with your health care provider to keep your blood pressure and cholesterol under control, and to manage any other heart conditions that you have.  Maintain a healthy weight. General instructions  Do not take baths, swim, or use a hot tub until your health  care provider approves.  Do not strain to have a bowel movement.  Avoid crossing your legs while sitting down.  Check your temperature every day for a fever. A fever may be a sign of infection.  If you are a woman and you plan to become pregnant,  talk with your health care provider before you become pregnant.  Wear compression stockings if your health care provider instructs you to do this. These stockings help to prevent blood clots and reduce swelling in your legs.  Tell all health care providers who care for you that you have an artificial (prosthetic) aortic valve. If you have or have had heart disease or endocarditis, tell all health care providers about these conditions as well.  Keep all follow-up visits as told by your health care provider. This is important. Contact a health care provider if:  You develop a skin rash.  You experience sudden, unexplained changes in your weight.  You have more redness, swelling, or pain around your incision.  You have more fluid or blood coming from your incision.  Your incision feels warm to the touch.  You have pus or a bad smell coming from your incision.  You have a fever. Get help right away if:  You develop chest pain that is different from the pain coming from your incision.  You develop shortness of breath or difficulty breathing.  You start to feel light-headed. These symptoms may represent a serious problem that is an emergency. Do not wait to see if the symptoms will go away. Get medical help right away. Call your local emergency services (911 in the [place] Do not drive yourself to the hospital. This information is not intended to replace advice given to you by your health care provider. Make sure you discuss any questions you have with your health care provider. Document Released: 06/16/2005 Document Revised: 05/05/2016 Document Reviewed: 11/01/2015 Elsevier Interactive Patient Education  2017 [employer].

## 2017-11-14 NOTE — Progress Notes (Signed)
Cardiology Office Note    Date:  11/15/2017   ID:  Theodore Jenkins, DOB 05-Apr-1946, MRN [id number]  PCP:  Theodore Einstein, MD  Cardiologist: Dr. Saunders Revel AVR: Dr. Angelena Form  Chief Complaint: Hospital follow up   History of Present Illness:   Theodore Jenkins is a 71 y.o. male with  history of coronary artery disease status post PCI to the LAD 2005, aortic stenosis, cerebrovascular disease status post right carotid endarterectomy with subsequent right internal carotid artery occlusion, and diabetes mellitus who presents for follow-up.   Most recent echo 08/15/17 with LVEF=55-60%, severe aortic stenosis with mean gradient 42 mmHg, peak gradient 73 mmHg, AVA 0.58 cm2. Cardiac cath 08/25/17 with mild to moderate LAD stenosis, moderate restenosis LAD stents, 80% stenosis OM2, dominant RCA with chronic total occlusion of the very small PDA. Transvalvular aortic valve gradient during cath with mean of 66 mmHg, peak to peak gradient 71 mmHg. Subsequently patient underwent  aortic valve replacement with bioprosthetic valve + CABG x 2 on 10/26/17.  No post op complications.   Here today for follow up.  He is walking approximately 15 minutes at a time without any shortness of breath.  Intermittent fatigue.  He has right shoulder/upper chest pain as well as left arm pain since surgery, this is improving.  No orthopnea, PND, syncope, lower extremity edema, melena or blood in his stool or urine.  Past Medical History:  Diagnosis Date  . Aortic stenosis    Echo (08/2017): Severe AS with mean gradient of 42 mmHg.  . Barrett's esophagus   . CAD (coronary artery disease) 06/06/2011   Remote stent to the LAD in 2005 // Last cath in 2010 showing diffuse CAD, small PD that is occluded and fills with left to right collaterals, 60-70% 1st OM, with normal EF. Managed medically // Myoview 3/18:  Normal perfusion. LVEF 55% with normal wall motion. This is a low risk study.  . Complication of anesthesia   . Diabetes mellitus   .  ED (erectile dysfunction)   . Family history of adverse reaction to anesthesia 10/23/2017   sister had history of malignant hyperthermia when she was 40, she is 69 years old now  . GERD (gastroesophageal reflux disease)   . Hyperlipidemia   . Hypertension   . IHD (ischemic heart disease)    prior stenting of the LAD in December of 2005; with last cath in 2010 showing diffuse CAD with normal LV function. He is managed medically.   . Malignant hyperthermia    sister had reaction concerning for malignant hyperthermia (~ 1988)  . Neuromuscular disorder ([place]) 10/23/2017   lumbar 4 disc in back causing nerve pain, on gabapentin  . Obesity   . Obstructive sleep apnea on CPAP   . PONV (postoperative nausea and vomiting)   . S/P aortic valve replacement with bioprosthetic valve 10/26/2017   25 mm [facility] Ease bovine pericardial tissue valve  . S/P coronary artery stent placement 2005   LAD    Past Surgical History:  Procedure Laterality Date  . AORTIC VALVE REPLACEMENT N/A 10/26/2017   Procedure: AORTIC VALVE REPLACEMENT (AVR), using Magna Ease 25;  Surgeon: Theodore Alberts, MD;  Location: [place];  Service: Open Heart Surgery;  Laterality: N/A;  . CARDIAC CATHETERIZATION  2010   NORMAL LV FUNCTION. DIFFUSE CORONARY DISEASE WITH THE DISTAL PORTION OF THE SMALL POSTERIOR DESCENDING VESSEL OCCLUDED AND FILLING WITH THE RIGHT COLLATERALS, 60-70% STENOSIS IN THE FIRST OBTUSE MARGINAL VESSEL AND  DIFFUSE SMALL VESSEL DISEASE  . CAROTID ENDARTERECTOMY    . CIRCUMCISION  1985  . CORONARY ARTERY BYPASS GRAFT N/A 10/26/2017   Procedure: CORONARY ARTERY BYPASS GRAFTING (CABG) x two , using left internal mammary artery and right leg greater saphenous vein harvested endoscopically;  Surgeon: Theodore Alberts, MD;  Location: [place];  Service: Open Heart Surgery;  Laterality: N/A;  . CORONARY STENT PLACEMENT  2005  . RIGHT/LEFT HEART CATH AND CORONARY ANGIOGRAPHY N/A 08/25/2017   Procedure: RIGHT/LEFT  HEART CATH AND CORONARY ANGIOGRAPHY;  Surgeon: Theodore Bush, MD;  Location: [place] CV LAB;  Service: Cardiovascular;  Laterality: N/A;  . rotator cuff surgery Right   . TEE WITHOUT CARDIOVERSION N/A 10/26/2017   Procedure: TRANSESOPHAGEAL ECHOCARDIOGRAM (TEE);  Surgeon: Theodore Alberts, MD;  Location: [place];  Service: Open Heart Surgery;  Laterality: N/A;    Current Medications: Prior to Admission medications   Medication Sig Start Date End Date Taking? Authorizing Provider  acetaminophen (TYLENOL) 500 MG tablet Take 1,000 mg by mouth every 8 (eight) hours as needed for headache.    [provider]  aspirin EC 325 MG EC tablet Take 1 tablet (325 mg total) by mouth daily. 11/02/17   Theodore Collard, PA-C  cetirizine (ZYRTEC) 10 MG tablet Take 10 mg by mouth daily.     [provider]  fluticasone (FLONASE) 50 MCG/ACT nasal spray Place 2 sprays into both nostrils daily as needed for allergies or rhinitis.     [provider]  gabapentin (NEURONTIN) 300 MG capsule Take 600 mg by mouth 2 (two) times daily.  08/01/17   [provider]  lisinopril (PRINIVIL,ZESTRIL) 10 MG tablet Take 0.5 tablets (5 mg total) by mouth daily. 02/08/17   Theodore Dopp T, PA-C  metoprolol succinate (TOPROL-XL) 50 MG 24 hr tablet TAKE 1 TABLET BY MOUTH  DAILY WITH A MEAL OR  IMMEDIATELY FOLLOWING A  MEAL Patient taking differently: TAKE 50 MG BY MOUTH  DAILY WITH A MEAL OR  IMMEDIATELY FOLLOWING A  MEAL 09/14/16   Theodore Dresser, MD  Multiple Vitamins-Minerals (ONE-A-DAY 50 PLUS PO) Take 1 tablet by mouth daily.    [provider]  omeprazole (PRILOSEC OTC) 20 MG tablet Take 20 mg by mouth daily.      [provider]  rosuvastatin (CRESTOR) 10 MG tablet TAKE 1 TABLET BY MOUTH  DAILY. Patient taking differently: TAKE 10 MG BY MOUTH  DAILY. 07/13/16   Theodore Dresser, MD  sitaGLIPtin-metformin (JANUMET) 50-1000 MG tablet Take 1 tablet by mouth 2 (two) times daily  with a meal.  12/19/16   [provider]  traMADol (ULTRAM) 50 MG tablet Take 1 tablet (50 mg total) by mouth every 4 (four) hours as needed for moderate pain. 11/01/17   Theodore Collard, PA-C    Allergies:   Oxycodone   Social History   Socioeconomic History  . Marital status: Married    Spouse name: None  . Number of children: 3  . Years of education: None  . Highest education level: None  Social Needs  . Financial resource strain: None  . Food insecurity - worry: None  . Food insecurity - inability: None  . Transportation needs - medical: None  . Transportation needs - non-medical: None  Occupational History  . Occupation: Retired-[employer]  . Smoking status: Former Smoker    Packs/day: 1.00    Years: 20.00    Pack years: 20.00  Types: Cigarettes    Last attempt to quit: 12/12/1992    Years since quitting: 24.9  . Smokeless tobacco: Never Used  Substance and Sexual Activity  . Alcohol use: Yes    Alcohol/week: 0.6 oz    Types: 1 Cans of beer per week  . Drug use: No  . Sexual activity: Not Currently  Other Topics Concern  . None  Social History Narrative  . None     Family History:  The patient's family history includes CAD in his brother; Cancer in his mother; Heart attack (age of onset: 74) in his father.   ROS:   Please see the history of present illness.    ROS All other systems reviewed and are negative.   PHYSICAL EXAM:   VS:  BP (!) 100/58   Pulse 78   Ht 5\' 9"  (1.753 m)   Wt 223 lb (101.2 kg)   SpO2 97%   BMI 32.93 kg/m    GEN: Well nourished, well developed, in no acute distress  HEENT: normal  Neck: no JVD, carotid bruits, or masses Cardiac:RRR; no murmurs, rubs, or gallops,no edema, midsternal surgical scar healing well Respiratory:  clear to auscultation bilaterally, normal work of breathing GI: soft, nontender, nondistended, + BS MS: no deformity or atrophy  Skin: warm and dry, no rash Neuro:  Alert  and Oriented x 3, Strength and sensation are intact Psych: euthymic mood, full affect  Wt Readings from Last 3 Encounters:  11/15/17 223 lb (101.2 kg)  11/01/17 225 lb 9.6 oz (102.3 kg)  10/23/17 238 lb 4.8 oz (108.1 kg)      Studies/Labs Reviewed:   EKG:  EKG is not ordered today.    Recent Labs: 10/27/2017: Magnesium 2.0 10/30/2017: ALT 56; Hemoglobin 9.4; Platelets 150 10/31/2017: BUN 24; Creatinine, Ser 1.06; Potassium 3.2; Sodium 131   Lipid Panel    Component Value Date/Time   CHOL 105 05/07/2013 0854   TRIG 110.0 05/07/2013 0854   HDL 35.80 (L) 05/07/2013 0854   CHOLHDL 3 05/07/2013 0854   VLDL 22.0 05/07/2013 0854   LDLCALC 47 05/07/2013 0854    Additional studies/ records that were reviewed today include:   Echocardiogram: 10/26/17 Study Conclusions  - Left ventricle: The cavity size was normal. Wall thickness was   normal. Systolic function was normal. The estimated ejection   fraction was in the range of 60% to 65%. Wall motion was normal;   there were no regional wall motion abnormalities. - Aortic valve: Normal-sized, moderately to severely calcified   annulus. Probably trileaflet; moderately thickened, severely   calcified leaflets. Cusp separation was severely reduced.   Transvalvular velocity was increased. There was severe stenosis.   There was trivial regurgitation originating from the central   coaptation point and directed along the septum. Peak velocity   (S): 473 cm/s. Mean gradient (S): 63 mm Hg. Peak gradient (S): 89   mm Hg. Valve area (VTI): 0.82 cm^2. - Staged echo: Limited Post- CPB exam: Following CABG and aortic   valve replacement, there is good LV function. There are no   regional wall motion abnormalities. Overall EF 50-55%. The   prosthetic aortic valve is well-seated in the Aortic annulus.   Post aortic valve replacement images demonstrate no residual   valvular insufficiency or perivalvular leak. No significant   change post  bypass in mitral valve function.  Impressions:  - Post aortic valve replacement surgery and CABG, the prosthetic   aortic valve appears to be functioning normally.  Excellent   prosthetic aortic valve function without evidence for   perivalvular leak. Other valves unchanged. No other significant   change from pre-bypass images.  RIGHT/LEFT HEART CATH AND CORONARY ANGIOGRAPHY 08/25/17  Conclusion   Findings: 1. Multivessel coronary artery disease including diffuse mild to moderate proximal to mid LAD disease, 50-60% in-stent restenosis of the distal LAD stent, moderate-caliber OM2 branch with 80% proximal disease, and dominant RCA with chronic total occlusion of very small posterior descending artery. Disease predominantly affects small/distal vessels. 2. Severe aortic stenosis with mean transvalvular gradient by pullback of 66 mmHg and peak-to-peak gradient of 71 mmHg. 3. Normal left and right heart filling pressures. 4. Normal Fick cardiac output.  Recommendations: 1. Proceed with TAVR/surgical evaluation for aortic valve replacement. 2. Medical therapy of coronary artery disease pending further evaluation for best valve replacement strategy.  3. Aggressive secondary prevention including low-dose aspirin and statin therapy.  Theodore Bush, MD [facility] Pager: [phone]        ASSESSMENT & PLAN:   1. CAD -No angina.  Continue aspirin 325 mg, Crestor 10 mg, beta-blocker and ACE.  2. S/p AVR -Recovering well.  No friction rub or effusion.  3. HTN -Soft low.  Advised to keep a log of his blood pressure.  Continue current regimen with Toprol-XL 50 mg and lisinopril 5 mg daily.  4. HLD -Continue statin.  5. Carotid artery disease  -  He is followed by [facility] in [facility].    Medication Adjustments/Labs and Tests Ordered: Current medicines are reviewed at length with the patient today.  Concerns regarding medicines are outlined  above.  Medication changes, Labs and Tests ordered today are listed in the Patient Instructions below. Patient Instructions  Medication Instructions: Your physician recommends that you continue on your current medications as directed. Please refer to the Current Medication list given to you today.  Labwork: None Ordered  Procedures/Testing: None Ordered  Follow-Up: Your physician recommends that you schedule a follow-up appointment in: 1-2 MONTHS with Dr. Angelena Form  Your physician recommends that you schedule a follow-up appointment in: 3-4 MONTHS with Dr. Saunders Revel  If you need a refill on your cardiac medications before your next appointment, please call your pharmacy.      Mahalia Longest [place], [place]  11/15/2017 8:21 AM    [address], [place], [place]  [address] Phone: [phone]; Fax: [phone]

## 2017-11-15 ENCOUNTER — Telehealth: Payer: Self-pay

## 2017-11-15 ENCOUNTER — Encounter: Payer: Self-pay | Admitting: Physician Assistant

## 2017-11-15 ENCOUNTER — Ambulatory Visit: Payer: PPO | Admitting: Physician Assistant

## 2017-11-15 VITALS — BP 100/58 | HR 78 | Ht 69.0 in | Wt 223.0 lb

## 2017-11-15 DIAGNOSIS — I10 Essential (primary) hypertension: Secondary | ICD-10-CM | POA: Diagnosis not present

## 2017-11-15 DIAGNOSIS — I25700 Atherosclerosis of coronary artery bypass graft(s), unspecified, with unstable angina pectoris: Secondary | ICD-10-CM

## 2017-11-15 DIAGNOSIS — I779 Disorder of arteries and arterioles, unspecified: Secondary | ICD-10-CM | POA: Diagnosis not present

## 2017-11-15 DIAGNOSIS — Z952 Presence of prosthetic heart valve: Secondary | ICD-10-CM | POA: Diagnosis not present

## 2017-11-15 DIAGNOSIS — I739 Peripheral vascular disease, unspecified: Secondary | ICD-10-CM

## 2017-11-15 DIAGNOSIS — E785 Hyperlipidemia, unspecified: Secondary | ICD-10-CM | POA: Diagnosis not present

## 2017-11-15 NOTE — Telephone Encounter (Signed)
Confirmed with patient wife per dpr that follow up with Dr. Angelena Form will not be needed at this time. They should keep scheduled follow up with Dr. Saunders Revel. If follow up echo needs to be ordered Dr. Saunders Revel will have the authority to do so. Both patient and wife are agreeable.

## 2017-11-15 NOTE — Patient Instructions (Signed)
Medication Instructions: Your physician recommends that you continue on your current medications as directed. Please refer to the Current Medication list given to you today.  Labwork: None Ordered  Procedures/Testing: None Ordered  Follow-Up: Your physician recommends that you schedule a follow-up appointment in: 1-2 MONTHS with Dr. Angelena Form  Your physician recommends that you schedule a follow-up appointment in: 3-4 MONTHS with Dr. Saunders Revel  If you need a refill on your cardiac medications before your next appointment, please call your pharmacy.

## 2017-11-15 NOTE — Telephone Encounter (Signed)
-----   Message from Philbert Riser sent at 11/15/2017 11:15 AM EST ----- Regarding: RE: 11/15/17  PAT,  THIS PATIENT NEEDS TO SEE,DR,.MCALHANY IN 1-2 MONTHS PER VIN.   Glade Lloyd,  Are you going to call this patient,f/u w/Dr.McAlhany was a mistake.  (Follow-Up: Your physician recommends that you schedule a follow-up appointment in: 1-2 MONTHS with Dr. Angelena Form)   ----- Message ----- From: Thompson Grayer, RN Sent: 11/15/2017   9:47 AM To: Philbert Riser Subject: FW: 11/15/17  PAT,  THIS PATIENT NEEDS TO SEE#  Does not need follow up with Dr. Angelena Form.  I checked with Vin and OK to  follow up with Dr. Saunders Revel as planned in 3-4 months. Thanks ----- Message ----- From: Burnell Blanks, MD Sent: 11/15/2017   9:42 AM To: Thompson Grayer, RN Subject: RE: 11/15/17  PAT,  THIS PATIENT NEEDS TO SEE#  No. He can follow with End. I saw him to consider TAVR but he had surgical AVR. Thanks pat.   Gerald Stabs ----- Message ----- From: Thompson Grayer, RN Sent: 11/15/2017   8:41 AM To: Burnell Blanks, MD Subject: FW: 11/15/17  PAT,  THIS PATIENT NEEDS TO SEE#  Pt saw Vin today and I got a message to contact him for a 1-2 month follow up with you.  He had an AVR. Primary cardiologist is Dr. Saunders Revel.  (plan is to see Dr. Saunders Revel in 3-4 months).  Does he need to follow with you also? ----- Message ----- From: Philbert Riser Sent: 11/15/2017   8:26 AM To: Thompson Grayer, RN, Jeanann Lewandowsky, RMA Subject: 11/15/17  PAT,  THIS PATIENT NEEDS TO SEE,DR,#

## 2017-12-08 ENCOUNTER — Other Ambulatory Visit: Payer: Self-pay | Admitting: Thoracic Surgery (Cardiothoracic Vascular Surgery)

## 2017-12-08 DIAGNOSIS — Z951 Presence of aortocoronary bypass graft: Secondary | ICD-10-CM

## 2017-12-11 ENCOUNTER — Ambulatory Visit (INDEPENDENT_AMBULATORY_CARE_PROVIDER_SITE_OTHER): Payer: Self-pay | Admitting: Physician Assistant

## 2017-12-11 ENCOUNTER — Other Ambulatory Visit: Payer: Self-pay

## 2017-12-11 ENCOUNTER — Ambulatory Visit
Admission: RE | Admit: 2017-12-11 | Discharge: 2017-12-11 | Disposition: A | Discharge status: Home / Self Care | Payer: PPO | Source: Ambulatory Visit | Attending: Thoracic Surgery (Cardiothoracic Vascular Surgery) | Admitting: Thoracic Surgery (Cardiothoracic Vascular Surgery)

## 2017-12-11 VITALS — BP 106/66 | HR 80 | Ht 69.0 in | Wt 223.0 lb

## 2017-12-11 DIAGNOSIS — G4733 Obstructive sleep apnea (adult) (pediatric): Secondary | ICD-10-CM | POA: Diagnosis not present

## 2017-12-11 DIAGNOSIS — Z736 Limitation of activities due to disability: Secondary | ICD-10-CM

## 2017-12-11 DIAGNOSIS — I25810 Atherosclerosis of coronary artery bypass graft(s) without angina pectoris: Secondary | ICD-10-CM | POA: Diagnosis not present

## 2017-12-11 DIAGNOSIS — Z951 Presence of aortocoronary bypass graft: Secondary | ICD-10-CM

## 2017-12-11 DIAGNOSIS — Z952 Presence of prosthetic heart valve: Secondary | ICD-10-CM

## 2017-12-11 NOTE — Progress Notes (Signed)
HPI: Patient returns for routine postoperative follow-up having undergone AVR, CABG x 2 on 10/26/2017.  The patient's early postoperative recovery while in the hospital he progressed as expected.  Since hospital discharge the patient reports he is doing well.  He states he continues to have some pain along his right chest, which is surprising to him.  He also states that he continues to have numbness in his pinky which has improved since discharge.  He is ambulating approximately 1 mile per day when the weather permits.  He states his incisions have healed without incidence, except his RLE incision itches.  His appetite has improved, but he does have occasions where he continues to get strange tastes in his mouth.  Current Outpatient Medications  Medication Sig Dispense Refill  . acetaminophen (TYLENOL) 500 MG tablet Take 1,000 mg by mouth every 8 (eight) hours as needed for headache.    [name] aspirin EC 325 MG EC tablet Take 1 tablet (325 mg total) by mouth daily. 30 tablet 0  . cetirizine (ZYRTEC) 10 MG tablet Take 10 mg by mouth daily.     . fluticasone (FLONASE) 50 MCG/ACT nasal spray Place 2 sprays into both nostrils daily as needed for allergies or rhinitis.     [name] gabapentin (NEURONTIN) 300 MG capsule Take 600 mg by mouth 2 (two) times daily.     [name] lisinopril (PRINIVIL,ZESTRIL) 10 MG tablet Take 0.5 tablets (5 mg total) by mouth daily. 90 tablet 3  . metoprolol succinate (TOPROL-XL) 50 MG 24 hr tablet TAKE 1 TABLET BY MOUTH  DAILY WITH A MEAL OR  IMMEDIATELY FOLLOWING A  MEAL (Patient taking differently: TAKE 50 MG BY MOUTH  DAILY WITH A MEAL OR  IMMEDIATELY FOLLOWING A  MEAL) 90 tablet 3  . Multiple Vitamins-Minerals (ONE-A-DAY 50 PLUS PO) Take 1 tablet by mouth daily.    [name] omeprazole (PRILOSEC OTC) 20 MG tablet Take 20 mg by mouth daily.      . rosuvastatin (CRESTOR) 10 MG tablet TAKE 1 TABLET BY MOUTH  DAILY. (Patient taking differently: TAKE 10 MG BY MOUTH  DAILY.) 90 tablet 1  .  sitaGLIPtin-metformin (JANUMET) 50-1000 MG tablet Take 1 tablet by mouth 2 (two) times daily with a meal.      No current facility-administered medications for this visit.     Physical Exam:  BP 106/66 (BP Location: Left Arm, Patient Position: Sitting, Cuff Size: Large)   Pulse 80   Ht 5\' 9"  (1.753 m)   Wt 223 lb (101.2 kg)   SpO2 97% Comment: room air  BMI 32.93 kg/m   Gen: no apparent distress Heart: RRR Lungs: CTA bilaterally Ext: minimal edema present Incisions: well healed  Diagnostic Tests:  CXR: no pneumothorax, sternal wires intact, no pleural effusions   A/P:  1. S/P CABG, AVR- doing very well 2. CV- NSR, BP is low at times- continue Toprol, ACE 3. Activity- increase as tolerated, continue to restrict weight to 10-15 lbs, may resume driving, start cardiac rehab 4. Patient educated on importance of notifying dentist of valve prosthesis and taking antibiotics prior to dental procedures 5. RTC in 3 months with CHO   Ellwood Handler, PA-C [facility] [phone]

## 2017-12-13 ENCOUNTER — Other Ambulatory Visit: Payer: Self-pay | Admitting: *Deleted

## 2017-12-14 ENCOUNTER — Other Ambulatory Visit: Payer: Self-pay | Admitting: *Deleted

## 2017-12-15 DIAGNOSIS — I2510 Atherosclerotic heart disease of native coronary artery without angina pectoris: Secondary | ICD-10-CM | POA: Diagnosis not present

## 2017-12-19 ENCOUNTER — Other Ambulatory Visit: Payer: Self-pay | Admitting: *Deleted

## 2017-12-21 DIAGNOSIS — G4733 Obstructive sleep apnea (adult) (pediatric): Secondary | ICD-10-CM | POA: Diagnosis not present

## 2017-12-21 DIAGNOSIS — Z9989 Dependence on other enabling machines and devices: Secondary | ICD-10-CM | POA: Diagnosis not present

## 2017-12-22 ENCOUNTER — Other Ambulatory Visit: Payer: Self-pay | Admitting: *Deleted

## 2017-12-22 NOTE — Patient Outreach (Signed)
Telephone call for high risk HTA patient who has had an aortic valve replacement and CABG within the last 3 months. Theodore Jenkins was not home but I was able to leave a message and requested a return call.  Eulah Pont. Myrtie Neither, MSN, [facility] Gerontological Nurse Practitioner [facility] [phone]

## 2017-12-25 ENCOUNTER — Other Ambulatory Visit: Payer: Self-pay | Admitting: *Deleted

## 2017-12-25 NOTE — Patient Outreach (Signed)
HTA High Risk Telephone screen #2 attempted. I left a message and requested a return call.  Eulah Pont. Myrtie Neither, MSN, [facility] Gerontological Nurse Practitioner [facility] Care Management [phone]

## 2017-12-26 ENCOUNTER — Other Ambulatory Visit: Payer: Self-pay | Admitting: *Deleted

## 2017-12-26 ENCOUNTER — Encounter: Payer: Self-pay | Admitting: *Deleted

## 2017-12-26 DIAGNOSIS — E119 Type 2 diabetes mellitus without complications: Secondary | ICD-10-CM | POA: Diagnosis not present

## 2017-12-26 DIAGNOSIS — H04123 Dry eye syndrome of bilateral lacrimal glands: Secondary | ICD-10-CM | POA: Diagnosis not present

## 2017-12-26 NOTE — Patient Outreach (Signed)
HTA High Risk Patient called and I was able to talk with him briefly. He reports he is doing well. He is going to cardiac rehab. He says he is doing a lot better than he anticipated. He is ready to go for his session today. I advised I will send out our information for his future reference.  Eulah Pont. Myrtie Neither, MSN, [facility] Gerontological Nurse Practitioner [facility] Care Management [phone]

## 2018-01-09 DIAGNOSIS — E118 Type 2 diabetes mellitus with unspecified complications: Secondary | ICD-10-CM | POA: Diagnosis not present

## 2018-01-09 DIAGNOSIS — I714 Abdominal aortic aneurysm, without rupture: Secondary | ICD-10-CM | POA: Diagnosis not present

## 2018-01-09 DIAGNOSIS — I10 Essential (primary) hypertension: Secondary | ICD-10-CM | POA: Diagnosis not present

## 2018-01-09 DIAGNOSIS — I2510 Atherosclerotic heart disease of native coronary artery without angina pectoris: Secondary | ICD-10-CM | POA: Diagnosis not present

## 2018-01-09 DIAGNOSIS — Z125 Encounter for screening for malignant neoplasm of prostate: Secondary | ICD-10-CM | POA: Diagnosis not present

## 2018-01-09 DIAGNOSIS — Z0000 Encounter for general adult medical examination without abnormal findings: Secondary | ICD-10-CM | POA: Diagnosis not present

## 2018-01-09 DIAGNOSIS — G4733 Obstructive sleep apnea (adult) (pediatric): Secondary | ICD-10-CM | POA: Diagnosis not present

## 2018-01-15 DIAGNOSIS — I2510 Atherosclerotic heart disease of native coronary artery without angina pectoris: Secondary | ICD-10-CM | POA: Diagnosis not present

## 2018-02-01 ENCOUNTER — Encounter: Payer: Self-pay | Admitting: Internal Medicine

## 2018-02-05 ENCOUNTER — Telehealth: Payer: Self-pay | Admitting: *Deleted

## 2018-02-05 MED ORDER — NITROGLYCERIN 0.4 MG SL SUBL: 0.4000 mg | SUBLINGUAL_TABLET | SUBLINGUAL | 3 refills | Status: DC | PRN

## 2018-02-05 NOTE — Telephone Encounter (Signed)
It is fine to send in a prescription for NTG 0.4 mg SL every 5 minutes as needed for chest pain, disp #30 with prn refills. Please let me know if any other questions or concerns arise.  Nelva Bush, MD [facility] Pager: [phone]

## 2018-02-05 NOTE — Telephone Encounter (Signed)
Spoke with patient's wife regarding husband's Nitro prescription.   She says that she likes to have some on hand, but the pharmacy will not refill until they receive a refill order.. Please advise.. Thank you

## 2018-02-05 NOTE — Telephone Encounter (Signed)
See my-chart msg. Patients wife called to f/u on request for nitro. Please advise. Thanks, MI

## 2018-02-12 DIAGNOSIS — I2510 Atherosclerotic heart disease of native coronary artery without angina pectoris: Secondary | ICD-10-CM | POA: Diagnosis not present

## 2018-02-15 ENCOUNTER — Ambulatory Visit: Payer: PPO | Admitting: Internal Medicine

## 2018-02-15 ENCOUNTER — Other Ambulatory Visit: Payer: Self-pay

## 2018-02-15 ENCOUNTER — Encounter: Payer: Self-pay | Admitting: Internal Medicine

## 2018-02-15 VITALS — BP 122/78 | HR 72 | Ht 69.0 in | Wt 229.8 lb

## 2018-02-15 DIAGNOSIS — Z952 Presence of prosthetic heart valve: Secondary | ICD-10-CM | POA: Insufficient documentation

## 2018-02-15 DIAGNOSIS — I10 Essential (primary) hypertension: Secondary | ICD-10-CM

## 2018-02-15 DIAGNOSIS — Z953 Presence of xenogenic heart valve: Secondary | ICD-10-CM | POA: Diagnosis not present

## 2018-02-15 DIAGNOSIS — I2510 Atherosclerotic heart disease of native coronary artery without angina pectoris: Secondary | ICD-10-CM | POA: Diagnosis not present

## 2018-02-15 DIAGNOSIS — I350 Nonrheumatic aortic (valve) stenosis: Secondary | ICD-10-CM | POA: Diagnosis not present

## 2018-02-15 DIAGNOSIS — E785 Hyperlipidemia, unspecified: Secondary | ICD-10-CM | POA: Diagnosis not present

## 2018-02-15 NOTE — Patient Instructions (Addendum)
Medication Instructions:  Your physician recommends that you continue on your current medications as directed. Please refer to the Current Medication list given to you today.  -- If you need a refill on your cardiac medications before your next appointment, please call your pharmacy. --  Labwork: None ordered  Testing/Procedures: None ordered  Follow-Up: Your physician wants you to follow-up in: 3 months with Dr. Saunders Revel.    You will receive a reminder letter in the mail two months in advance. If you don't receive a letter, please call our office to schedule the follow-up appointment.  Thank you for choosing [facility]!!    Any Other Special Instructions Will Be Listed Below (If Applicable).

## 2018-02-15 NOTE — Progress Notes (Signed)
Follow-up Outpatient Visit Date: 02/15/2018  Primary Care Provider: Karlene Einstein, [place] [id number]  Chief Complaint: Follow-up aortic stenosis and CAD  HPI:  Theodore Jenkins is a 72 y.o. year-old male with history of coronary artery disease status post PCI to the LAD in 2005 and CABG (LIMA to LAD and SVG to OM) in 2018, severe aortic stenosis status post bioprosthetic aortic valve replacement (2018), cerebrovascular disease status post right carotid endarterectomy with subsequent right internal carotid artery occlusion, and type 2 diabetes mellitus, who presents for follow-up of aortic stenosis and coronary artery disease. Theodore Jenkins has been doing well. He notes some itching along his sternotomy incision but no chest pain or soreness. He continues to have some numbness involving the ulnar distribution of both hands, though this is gradually improving. He denies anginal chest pain as well as shortness of breath, palpitations, and lightheadedness. He notes occasional right lower extremity edema when seated in his recliner (this is the site of SVG harvest). He is participating in cardiac rehabilitation without any difficulties. He is scheduled to follow-up with Dr. Roxy Manns on 03/12/18.  --------------------------------------------------------------------------------------------------  Cardiovascular History & Procedures: Cardiovascular Problems:  Coronary artery disease status post PCI and subsequent CABG  Aortic stenosis status post bioprosthetic AVR  Cerebrovascular disease  Risk Factors:  Known coronary artery disease, cerebrovascular disease, hypertension, hyperlipidemia, diabetes mellitus.  Cath/PCI:  LHC/RHC (08/25/17): LMCA normal. LAD with 40% proximal disease and 60% mid vessel in-stent restenosis. LCx with sequential 30 and 40% lesions as well as 80-90% proximal OM 2 stenosis. RCA with 25% proximal and mid disease as well as severe diffuse disease of the PDA  with left-to-right collaterals. LVEDP 13 mmHg. Mean aortic valve gradient 66 mmHg with a valve area of 0.8 cm. RA 3, RV 28/5, PA 28/7 (14), PCWP 9. AO sat 98%, PA sat 72%, RA sat 71%. Fick CO/CI 6.1/2.8.  LHC/PCI (11/12/04): LMCA normal. LAD with diffuse luminal irregularities with 60-70% mid and 90-95% distal disease. LCx with 2 large OM branches and 60-70% stenosis involving OM1. Large, dominant RCA with diffuse luminal irregularities. RPDA with severe diffuse disease. Successful PCI to mid and distal LAD with placement of overlapping Cypher stents.  CV Surgery:  CABG/AVR (10/26/17): LIMA to LAD and SVG to OM. Bioprosthetic aortic valve replacement with [facility] Ease 25 mm pericardial tissue valve  Right carotid endarterectomy (10/29/02)  EP Procedures and Devices:  None  Non-Invasive Evaluation(s):  TTE (08/15/17): Normal LV size with mild LVH. LVEF 55-60% with grade 1 diastolic dysfunction. Severe aortic stenosis (mean gradient 42 mmHg) with trace aortic regurgitation. Mitral annular calcification. Mild aortic root enlargement. Mild left atrial enlargement. Normal RV size and function.  Pharmacologic MPI (02/14/17): Low risk study without evidence of ischemia or scar. LVEF 55%.  Recent CV Pertinent Labs: Lab Results  Component Value Date   CHOL 105 05/07/2013   HDL 35.80 (L) 05/07/2013   LDLCALC 47 05/07/2013   TRIG 110.0 05/07/2013   CHOLHDL 3 05/07/2013   INR 1.49 10/26/2017   K 3.2 (L) 10/31/2017   MG 2.0 10/27/2017   BUN 24 (H) 10/31/2017   BUN 22 08/18/2017   CREATININE 1.06 10/31/2017    Past medical and surgical history were reviewed and updated in EPIC.  Current Meds  Medication Sig  . acetaminophen (TYLENOL) 500 MG tablet Take 1,000 mg by mouth every 8 (eight) hours as needed for headache.  [name] aspirin EC 325 MG EC tablet Take 1 tablet (  325 mg total) by mouth daily.  . cetirizine (ZYRTEC) 10 MG tablet Take 10 mg by mouth daily.   . fluticasone (FLONASE) 50  MCG/ACT nasal spray Place 2 sprays into both nostrils daily as needed for allergies or rhinitis.   [name] gabapentin (NEURONTIN) 300 MG capsule Take 600 mg by mouth daily.  [name] lisinopril (PRINIVIL,ZESTRIL) 10 MG tablet Take 0.5 tablets (5 mg total) by mouth daily.  . metoprolol succinate (TOPROL-XL) 50 MG 24 hr tablet TAKE 1 TABLET BY MOUTH  DAILY WITH A MEAL OR  IMMEDIATELY FOLLOWING A  MEAL (Patient taking differently: TAKE 50 MG BY MOUTH  DAILY WITH A MEAL OR  IMMEDIATELY FOLLOWING A  MEAL)  . Multiple Vitamins-Minerals (ONE-A-DAY 50 PLUS PO) Take 1 tablet by mouth daily.  . nitroGLYCERIN (NITROSTAT) 0.4 MG SL tablet Place 1 tablet (0.4 mg total) under the tongue every 5 (five) minutes as needed for chest pain.  [name] omeprazole (PRILOSEC OTC) 20 MG tablet Take 20 mg by mouth daily.    Glory Rosebush VERIO test strip TEST BS ONCE D  . rosuvastatin (CRESTOR) 10 MG tablet TAKE 1 TABLET BY MOUTH  DAILY. (Patient taking differently: TAKE 10 MG BY MOUTH  DAILY.)  . sitaGLIPtin-metformin (JANUMET) 50-1000 MG tablet Take 1 tablet by mouth 2 (two) times daily with a meal.     Allergies: Oxycodone  Social History   Socioeconomic History  . Marital status: Married    Spouse name: Not on file  . Number of children: 3  . Years of education: Not on file  . Highest education level: Not on file  Social Needs  . Financial resource strain: Not on file  . Food insecurity - worry: Not on file  . Food insecurity - inability: Not on file  . Transportation needs - medical: Not on file  . Transportation needs - non-medical: Not on file  Occupational History  . Occupation: Retired-[employer]  . Smoking status: Former Smoker    Packs/day: 1.00    Years: 20.00    Pack years: 20.00    Types: Cigarettes    Last attempt to quit: 12/12/1992    Years since quitting: 25.1  . Smokeless tobacco: Never Used  Substance and Sexual Activity  . Alcohol use: Yes    Alcohol/week: 0.6 oz    Types: 1  Cans of beer per week  . Drug use: No  . Sexual activity: Not Currently  Other Topics Concern  . Not on file  Social History Narrative  . Not on file    Family History  Problem Relation Age of Onset  . Cancer Mother   . Heart attack Father 40  . CAD Brother     Review of Systems: A 12-system review of systems was performed and was negative except as noted in the HPI.  --------------------------------------------------------------------------------------------------  Physical Exam: BP 122/78   Pulse 72   Ht 5\' 9"  (1.753 m)   Wt 229 lb 12.8 oz (104.2 kg)   SpO2 95%   BMI 33.94 kg/m   General:  Obese man, seated comfortably in the exam room. He is accompanied by his wife. HEENT: No conjunctival pallor or scleral icterus. Moist mucous membranes.  OP clear. Neck: Supple without lymphadenopathy, thyromegaly, JVD, or HJR. Median sternotomy incision is well-healed. Lungs: Normal work of breathing. Clear to auscultation bilaterally without wheezes or crackles. Heart: Regular rate and rhythm without murmurs, rubs, or gallops. Non-displaced PMI. Abd: Bowel sounds present. Soft, NT/ND  without hepatosplenomegaly Ext: Trace pretibial edema. Right SVG harvest incision is well-healed. Skin: Warm and dry without rash.  Lab Results  Component Value Date   WBC 14.0 (H) 10/30/2017   HGB 9.4 (L) 10/30/2017   HCT 28.0 (L) 10/30/2017   MCV 94.9 10/30/2017   PLT 150 10/30/2017    Lab Results  Component Value Date   NA 131 (L) 10/31/2017   K 3.2 (L) 10/31/2017   CL 95 (L) 10/31/2017   CO2 28 10/31/2017   BUN 24 (H) 10/31/2017   CREATININE 1.06 10/31/2017   GLUCOSE 158 (H) 10/31/2017   ALT 56 10/30/2017    Lab Results  Component Value Date   CHOL 105 05/07/2013   HDL 35.80 (L) 05/07/2013   LDLCALC 47 05/07/2013   TRIG 110.0 05/07/2013   CHOLHDL 3 05/07/2013   Outside lipid panel (01/09/18): Total cholesterol 1:15, triglyceride 100, HDL 40, LDL  68  --------------------------------------------------------------------------------------------------  ASSESSMENT AND PLAN: Coronary artery disease without angina Theodore Jenkins is recovering well from his CABG/AVR in November. We will continue his current regimen for secondary prevention, including aspirin, metoprolol, and rosuvastatin. Recent lipid panel by his PCP reveals an LDL of 68. I will defer de-escalation of aspirin to 81 mg daily to Dr. Roxy Manns. I encouraged Theodore Jenkins to continue with cardiac rehabilitation.  Severe aortic stenosis status post bioprosthetic aortic valve replacement Theodore Jenkins tolerated the procedure well and does not have any symptoms to suggest valve dysfunction. I will have him continue aspirin 325 mg daily pending evaluation by Dr. Roxy Manns on 03/12/18. We discussed the importance of prophylactic antibiotics for any dental surgery.  Hypertension Blood pressure adequately controlled today. No medication changes.  Hyperlipidemia Recent lipid panel obtained by Dr. Vista Lawman revealed LDL of 68. We will continue current dose of rosuvastatin.  Follow-up: Return to clinic in 3 months.  Nelva Bush, MD 02/15/2018 4:39 PM

## 2018-03-12 ENCOUNTER — Ambulatory Visit: Payer: PPO | Admitting: Thoracic Surgery (Cardiothoracic Vascular Surgery)

## 2018-03-12 DIAGNOSIS — G4733 Obstructive sleep apnea (adult) (pediatric): Secondary | ICD-10-CM | POA: Diagnosis not present

## 2018-03-12 DIAGNOSIS — I2510 Atherosclerotic heart disease of native coronary artery without angina pectoris: Secondary | ICD-10-CM | POA: Diagnosis not present

## 2018-03-19 ENCOUNTER — Ambulatory Visit: Payer: PPO | Admitting: Thoracic Surgery (Cardiothoracic Vascular Surgery)

## 2018-03-19 ENCOUNTER — Other Ambulatory Visit: Payer: Self-pay

## 2018-03-19 ENCOUNTER — Encounter: Payer: Self-pay | Admitting: Thoracic Surgery (Cardiothoracic Vascular Surgery)

## 2018-03-19 VITALS — BP 109/61 | HR 69 | Resp 18 | Ht 69.0 in | Wt 228.0 lb

## 2018-03-19 DIAGNOSIS — I2510 Atherosclerotic heart disease of native coronary artery without angina pectoris: Secondary | ICD-10-CM | POA: Diagnosis not present

## 2018-03-19 DIAGNOSIS — Z953 Presence of xenogenic heart valve: Secondary | ICD-10-CM | POA: Diagnosis not present

## 2018-03-19 DIAGNOSIS — I350 Nonrheumatic aortic (valve) stenosis: Secondary | ICD-10-CM | POA: Diagnosis not present

## 2018-03-19 DIAGNOSIS — Z951 Presence of aortocoronary bypass graft: Secondary | ICD-10-CM

## 2018-03-19 MED ORDER — ASPIRIN 81 MG PO TBEC
81.0000 mg | DELAYED_RELEASE_TABLET | Freq: Every day | ORAL | Status: DC
Start: 2018-03-19 — End: 2020-12-15

## 2018-03-19 NOTE — Patient Instructions (Signed)
Decrease your dose of aspirin to 81 mg daily  Continue all other previous medications without any changes at this time  Endocarditis is a potentially serious infection of heart valves or inside lining of the heart.  It occurs more commonly in patients with diseased heart valves (such as patient's with aortic or mitral valve disease) and in patients who have undergone heart valve repair or replacement.  Certain surgical and dental procedures may put you at risk, such as dental cleaning, other dental procedures, or any surgery involving the respiratory, urinary, gastrointestinal tract, gallbladder or prostate gland.   To minimize your chances for develooping endocarditis, maintain good oral health and seek prompt medical attention for any infections involving the mouth, teeth, gums, skin or urinary tract.    Always notify your doctor or dentist about your underlying heart valve condition before having any invasive procedures. You will need to take antibiotics before certain procedures, including all routine dental cleanings or other dental procedures.  Your cardiologist or dentist should prescribe these antibiotics for you to be taken ahead of time.

## 2018-03-19 NOTE — Progress Notes (Signed)
[address]       [place] [address]             [phone]     CARDIOTHORACIC SURGERY OFFICE NOTE  Referring Provider is End, Harrell Gave, MD PCP is Karlene Einstein, MD   HPI:  Patient is a 72 year old male with history of aortic stenosis, coronary artery disease, hypertension, type 2 diabetes mellitus, cerebrovascular disease, Barrett's esophagitis, and obstructive sleep apnea who returns to the office today for routine follow-up status post aortic valve replacement using a bioprosthetic tissue valve and coronary artery bypass grafting x2 on October 26, 2017 for severe symptomatic aortic stenosis and multivessel coronary artery disease.  The patient's early postoperative recovery was uneventful and he was last seen here in our office on December 11, 2017 at which time he was doing very well.  Since then he has been seen in follow-up by Dr. Saunders Revel and he returns to our office today for routine follow-up.  He has completed the cardiac rehab program.  He states that he is back to normal activity and he feels "much better" than he did prior to surgery.  He reports no significant exertional shortness of breath or chest pain.  Overall he has had no problems whatsoever.   Current Outpatient Medications  Medication Sig Dispense Refill  . acetaminophen (TYLENOL) 500 MG tablet Take 1,000 mg by mouth every 8 (eight) hours as needed for headache.    [name] aspirin EC 325 MG EC tablet Take 1 tablet (325 mg total) by mouth daily. 30 tablet 0  . cetirizine (ZYRTEC) 10 MG tablet Take 10 mg by mouth daily.     . fluticasone (FLONASE) 50 MCG/ACT nasal spray Place 2 sprays into both nostrils daily as needed for allergies or rhinitis.     [name] gabapentin (NEURONTIN) 300 MG capsule Take 600 mg by mouth daily.    [name] lisinopril (PRINIVIL,ZESTRIL) 10 MG tablet Take 0.5 tablets (5 mg total) by mouth daily. 90 tablet 3  . metoprolol succinate (TOPROL-XL) 50 MG 24 hr tablet TAKE 1 TABLET BY MOUTH   DAILY WITH A MEAL OR  IMMEDIATELY FOLLOWING A  MEAL (Patient taking differently: TAKE 50 MG BY MOUTH  DAILY WITH A MEAL OR  IMMEDIATELY FOLLOWING A  MEAL) 90 tablet 3  . Multiple Vitamins-Minerals (ONE-A-DAY 50 PLUS PO) Take 1 tablet by mouth daily.    . nitroGLYCERIN (NITROSTAT) 0.4 MG SL tablet Place 1 tablet (0.4 mg total) under the tongue every 5 (five) minutes as needed for chest pain. 30 tablet 3  . omeprazole (PRILOSEC OTC) 20 MG tablet Take 20 mg by mouth daily.      Glory Rosebush VERIO test strip TEST BS ONCE D  3  . rosuvastatin (CRESTOR) 10 MG tablet TAKE 1 TABLET BY MOUTH  DAILY. (Patient taking differently: TAKE 10 MG BY MOUTH  DAILY.) 90 tablet 1  . sitaGLIPtin-metformin (JANUMET) 50-1000 MG tablet Take 1 tablet by mouth 2 (two) times daily with a meal.      No current facility-administered medications for this visit.       Physical Exam:   BP 109/61 (BP Location: Right Arm, Patient Position: Sitting, Cuff Size: Normal)   Pulse 69   Resp 18   Ht 5\' 9"  (1.753 m)   Wt 228 lb (103.4 kg)   SpO2 98% Comment: RA  BMI 33.67 kg/m   General:  Well-appearing  Chest:   Clear to auscultation  CV:  Regular rate and rhythm without murmur  Incisions:  Completely healed, sternum is stable  Abdomen:  Soft nontender  Extremities:  Warm and well-perfused  Diagnostic Tests:  n/a   Impression:  Patient is doing very well more than 4 months status post aortic valve replacement coronary artery bypass grafting   Plan:  I have encouraged the patient to continue to increase his physical activity without any particular limitations at this time.  We have not recommended any changes to his current medications, although we have suggested to him that he may decrease his dose of aspirin to 81 mg daily if desired.  The patient has been reminded regarding the importance of dental hygiene and the lifelong need for antibiotic prophylaxis for all dental cleanings and other related invasive  procedures.  At some point he should undergo routine follow-up echocardiogram to establish new baseline following surgery.  We will leave this to the discretion of Dr. Saunders Revel.  Patient will return to our office for routine follow-up next November.  He will call and return sooner should specific problems or questions arise.  I spent in excess of 15 minutes during the conduct of this office consultation and >50% of this time involved direct face-to-face encounter with the patient for counseling and/or coordination of their care.    Valentina Gu. Roxy Manns, MD 03/19/2018 2:04 PM

## 2018-05-23 NOTE — Progress Notes (Signed)
Follow-up Outpatient Visit Date: 05/24/2018  Primary Care Provider: Karlene Einstein, MD [address] [place] [address]  Chief Complaint: Follow-up CAD and aortic stenosis  HPI:  Mr. Salido is a 72 y.o. year-old male with history of coronary artery disease status post PCI to the LAD in 2005 and CABG (LIMA to LAD and SVG to OM) in 2018, severe aortic stenosis status post bioprosthetic aortic valve replacement (2018), cerebrovascular disease status post right carotid endarterectomy with subsequent right internal carotid artery occlusion, and type 2 diabetes mellitus, who presents for follow-up of CAD and aortic valvular disease.  I last saw him in March, at which time he was doing well.  He completed cardiac rehab and also followed up with Dr. Roxy Manns in April.  Echocardiogram was recommended to establish baseline aortic valve function following bioprosthetic AVR.  Today, Mr. Debold reports feeling quite well.  He denies chest pain, shortness of breath, palpitations, and orthopnea.  He endorses occasional orthostatic lightheadedness when he stands up quickly.  He has not fallen or passed out.  He has mild right leg edema, which has been present since his CABG (ipsilateral to SVG harvest).  He has noted some fatigue during hot days but continues to exercise on his treadmill and elliptical trainer without difficulty.  He is compliant with his medications and has noted less bruising since aspirin was reduced to 81 mg daily from 325 mg daily.  He takes prophylactic antibiotics before dental work.  Mr. Pilch tripped over his dog's leash several weeks ago and landed on his chest.  He had some left-sided chest wall soreness, which has since resolved.  --------------------------------------------------------------------------------------------------  Cardiovascular History & Procedures: Cardiovascular Problems:  Coronary artery disease status post PCI and subsequent CABG  Aortic stenosis status  post bioprosthetic AVR  Cerebrovascular disease  Risk Factors:  Known coronary artery disease, cerebrovascular disease, hypertension, hyperlipidemia, diabetes mellitus.  Cath/PCI:  LHC/RHC (08/25/17): LMCA normal. LAD with 40% proximal disease and 60% mid vessel in-stent restenosis. LCx with sequential 30 and 40% lesions as well as 80-90% proximal OM 2 stenosis. RCA with 25% proximal and mid disease as well as severe diffuse disease of the PDA with left-to-right collaterals. LVEDP 13 mmHg. Mean aortic valve gradient 66 mmHg with a valve area of 0.8 cm. RA 3, RV 28/5, PA 28/7 (14), PCWP 9. AO sat 98%, PA sat 72%, RA sat 71%. Fick CO/CI 6.1/2.8.  LHC/PCI (11/12/04): LMCA normal. LAD with diffuse luminal irregularities with 60-70% mid and 90-95% distal disease. LCx with 2 large OM branches and 60-70% stenosis involving OM1. Large, dominant RCA with diffuse luminal irregularities. RPDA with severe diffuse disease. Successful PCI tomid anddistal LAD with placement of overlappingCypherstents.  CV Surgery:  CABG/AVR (10/26/17): LIMA to LAD and SVG to OM. Bioprosthetic aortic valve replacement with [facility] Ease 25 mm pericardial tissue valve  Right carotid endarterectomy (10/29/02)  EP Procedures and Devices:  None  Non-Invasive Evaluation(s):  TTE (08/15/17): Normal LV size with mild LVH. LVEF 55-60% with grade 1 diastolic dysfunction. Severe aortic stenosis (mean gradient 42 mmHg) with trace aortic regurgitation. Mitral annular calcification. Mild aortic root enlargement. Mild left atrial enlargement. Normal RV size and function.  Pharmacologic MPI (02/14/17): Low risk study without evidence of ischemia or scar. LVEF 55%.  Recent CV Pertinent Labs: Lab Results  Component Value Date   CHOL 105 05/07/2013   HDL 35.80 (L) 05/07/2013   LDLCALC 47 05/07/2013   TRIG 110.0 05/07/2013   CHOLHDL 3 05/07/2013  INR 1.49 10/26/2017   K 3.2 (L) 10/31/2017   MG 2.0 10/27/2017   BUN 24  (H) 10/31/2017   BUN 22 08/18/2017   CREATININE 1.06 10/31/2017    Past medical and surgical history were reviewed and updated in EPIC.  Current Meds  Medication Sig  . acetaminophen (TYLENOL) 500 MG tablet Take 1,000 mg by mouth every 8 (eight) hours as needed for headache.  [name] amoxicillin (AMOXIL) 500 MG tablet Take 1 tablet by mouth as needed.  [name] aspirin 81 MG EC tablet Take 1 tablet (81 mg total) by mouth daily.  . cetirizine (ZYRTEC) 10 MG tablet Take 10 mg by mouth daily.   . fluticasone (FLONASE) 50 MCG/ACT nasal spray Place 2 sprays into both nostrils daily as needed for allergies or rhinitis.   [name] gabapentin (NEURONTIN) 300 MG capsule Take 600 mg by mouth daily.  [name] lisinopril (PRINIVIL,ZESTRIL) 10 MG tablet Take 0.5 tablets (5 mg total) by mouth daily.  . metoprolol succinate (TOPROL-XL) 50 MG 24 hr tablet Take 50 mg by mouth daily. Take with or immediately following a meal.  . Multiple Vitamins-Minerals (ONE-A-DAY 50 PLUS PO) Take 1 tablet by mouth daily.  [name] omeprazole (PRILOSEC OTC) 20 MG tablet Take 20 mg by mouth daily.    Glory Rosebush VERIO test strip TEST BS ONCE D  . rosuvastatin (CRESTOR) 10 MG tablet Take 10 mg by mouth daily.  . sitaGLIPtin-metformin (JANUMET) 50-1000 MG tablet Take 1 tablet by mouth 2 (two) times daily with a meal.     Allergies: Oxycodone  Social History   Tobacco Use  . Smoking status: Former Smoker    Packs/day: 1.00    Years: 20.00    Pack years: 20.00    Types: Cigarettes    Last attempt to quit: 12/12/1992    Years since quitting: 25.4  . Smokeless tobacco: Never Used  Substance Use Topics  . Alcohol use: Yes    Alcohol/week: 0.6 oz    Types: 1 Cans of beer per week  . Drug use: No    Family History  Problem Relation Age of Onset  . Cancer Mother   . Heart attack Father 41  . CAD Brother     Review of Systems: A 12-system review of systems was performed and was negative except as noted in the  HPI.  --------------------------------------------------------------------------------------------------  Physical Exam: BP 108/66   Pulse 70   Ht 5\' 9"  (1.753 m)   Wt 230 lb (104.3 kg)   SpO2 97%   BMI 33.97 kg/m   General: NAD. HEENT: No conjunctival pallor or scleral icterus. Moist mucous membranes.  OP clear. Neck: Supple without lymphadenopathy, thyromegaly, JVD, or HJR. Lungs: Normal work of breathing. Clear to auscultation bilaterally without wheezes or crackles. Heart: Regular rate and rhythm with 1/6 systolic murmur loudest at the right upper sternal border.  No rubs or gallops.  Median sternotomy is well-healed without instability. Abd: Bowel sounds present. Soft, NT/ND without hepatosplenomegaly Ext: No lower extremity edema. Skin: Warm and dry without rash.  EKG: NSR with first-degree AV block, left axis deviation, and borderline LBBB.  Compared with prior tracing from 10/27/2017, lateral ST/T changes are less pronounced.  Lab Results  Component Value Date   WBC 14.0 (H) 10/30/2017   HGB 9.4 (L) 10/30/2017   HCT 28.0 (L) 10/30/2017   MCV 94.9 10/30/2017   PLT 150 10/30/2017    Lab Results  Component Value Date   NA 131 (L) 10/31/2017   K  3.2 (L) 10/31/2017   CL 95 (L) 10/31/2017   CO2 28 10/31/2017   BUN 24 (H) 10/31/2017   CREATININE 1.06 10/31/2017   GLUCOSE 158 (H) 10/31/2017   ALT 56 10/30/2017    Lab Results  Component Value Date   CHOL 105 05/07/2013   HDL 35.80 (L) 05/07/2013   LDLCALC 47 05/07/2013   TRIG 110.0 05/07/2013   CHOLHDL 3 05/07/2013   Direct LDL (12/2017): 68 --------------------------------------------------------------------------------------------------  ASSESSMENT AND PLAN: Coronary artery disease without angina No chest pain or other symptoms to suggest worsening coronary insufficiency.  Continue aggressive secondary prevention including aspirin, metoprolol, and statin therapy.  LDL in 12/2017 was at goal.  Aortic stenosis  status post bioprosthetic AVR No symptoms of heart failure.  Some fatigue noted when out in the heat.  Mr. Seyler appears euvolemic on exam today.  We will obtain a transthoracic echocardiogram for assessment of his bioprosthetic aortic valve replacement.  Continue low-dose aspirin and SBE prophylaxis before dental procedures.  Hypertension Blood pressure low normal today.  Continue current regimen.  I encouraged Mr. Quinto to stay well-hydrated, particularly when outdoors in the heat.  Hyperlipidemia (goal LDL less than 70) LDL at goal upon last check in 12/2017 (68).  Continue rosuvastatin 10 mg daily.   Follow-up: Return to clinic in 4 months.  Nelva Bush, MD 05/24/2018 8:35 AM

## 2018-05-24 ENCOUNTER — Encounter: Payer: Self-pay | Admitting: Internal Medicine

## 2018-05-24 ENCOUNTER — Ambulatory Visit: Payer: PPO | Admitting: Internal Medicine

## 2018-05-24 VITALS — BP 108/66 | HR 70 | Ht 69.0 in | Wt 230.0 lb

## 2018-05-24 DIAGNOSIS — I350 Nonrheumatic aortic (valve) stenosis: Secondary | ICD-10-CM | POA: Diagnosis not present

## 2018-05-24 DIAGNOSIS — E785 Hyperlipidemia, unspecified: Secondary | ICD-10-CM | POA: Diagnosis not present

## 2018-05-24 DIAGNOSIS — I2510 Atherosclerotic heart disease of native coronary artery without angina pectoris: Secondary | ICD-10-CM | POA: Diagnosis not present

## 2018-05-24 DIAGNOSIS — Z953 Presence of xenogenic heart valve: Secondary | ICD-10-CM

## 2018-05-24 DIAGNOSIS — I10 Essential (primary) hypertension: Secondary | ICD-10-CM

## 2018-05-24 NOTE — Patient Instructions (Addendum)
Medication Instructions:  Your physician recommends that you continue on your current medications as directed. Please refer to the Current Medication list given to you today.  -- If you need a refill on your cardiac medications before your next appointment, please call your pharmacy. --  Labwork: None ordered  Testing/Procedures:PLEASE SCHEDULE  Your physician has requested that you have an echocardiogram. Echocardiography is a painless test that uses sound waves to create images of your heart. It provides your doctor with information about the size and shape of your heart and how well your heart's chambers and valves are working. This procedure takes approximately one hour. There are no restrictions for this procedure.   Follow-Up: Your physician wants you to follow-up in: 4 months with Dr. Saunders Revel.     Thank you for choosing [facility]!!    Any Other Special Instructions Will Be Listed Below (If Applicable).   Echocardiogram An echocardiogram, or echocardiography, uses sound waves (ultrasound) to produce an image of your heart. The echocardiogram is simple, painless, obtained within a short period of time, and offers valuable information to your health care provider. The images from an echocardiogram can provide information such as:  Evidence of coronary artery disease (CAD).  Heart size.  Heart muscle function.  Heart valve function.  Aneurysm detection.  Evidence of a past heart attack.  Fluid buildup around the heart.  Heart muscle thickening.  Assess heart valve function.  Tell a health care provider about:  Any allergies you have.  All medicines you are taking, including vitamins, herbs, eye drops, creams, and over-the-counter medicines.  Any problems you or family members have had with anesthetic medicines.  Any blood disorders you have.  Any surgeries you have had.  Any medical conditions you have.  Whether you are pregnant or may be pregnant. What  happens before the procedure? No special preparation is needed. Eat and drink normally. What happens during the procedure?  In order to produce an image of your heart, gel will be applied to your chest and a wand-like tool (transducer) will be moved over your chest. The gel will help transmit the sound waves from the transducer. The sound waves will harmlessly bounce off your heart to allow the heart images to be captured in real-time motion. These images will then be recorded.  You may need an IV to receive a medicine that improves the quality of the pictures. What happens after the procedure? You may return to your normal schedule including diet, activities, and medicines, unless your health care provider tells you otherwise. This information is not intended to replace advice given to you by your health care provider. Make sure you discuss any questions you have with your health care provider. Document Released: 11/25/2000 Document Revised: 07/16/2016 Document Reviewed: 08/05/2013 Elsevier Interactive Patient Education  2017 [employer].

## 2018-05-30 ENCOUNTER — Other Ambulatory Visit: Payer: Self-pay

## 2018-05-30 ENCOUNTER — Ambulatory Visit (HOSPITAL_COMMUNITY): Payer: PPO | Attending: Cardiology

## 2018-05-30 DIAGNOSIS — E119 Type 2 diabetes mellitus without complications: Secondary | ICD-10-CM | POA: Diagnosis not present

## 2018-05-30 DIAGNOSIS — E785 Hyperlipidemia, unspecified: Secondary | ICD-10-CM | POA: Insufficient documentation

## 2018-05-30 DIAGNOSIS — I119 Hypertensive heart disease without heart failure: Secondary | ICD-10-CM | POA: Diagnosis not present

## 2018-05-30 DIAGNOSIS — I2510 Atherosclerotic heart disease of native coronary artery without angina pectoris: Secondary | ICD-10-CM | POA: Diagnosis not present

## 2018-05-30 DIAGNOSIS — I350 Nonrheumatic aortic (valve) stenosis: Secondary | ICD-10-CM | POA: Diagnosis not present

## 2018-05-30 DIAGNOSIS — Z953 Presence of xenogenic heart valve: Secondary | ICD-10-CM | POA: Insufficient documentation

## 2018-06-12 DIAGNOSIS — G4733 Obstructive sleep apnea (adult) (pediatric): Secondary | ICD-10-CM | POA: Diagnosis not present

## 2018-07-09 DIAGNOSIS — I10 Essential (primary) hypertension: Secondary | ICD-10-CM | POA: Diagnosis not present

## 2018-07-09 DIAGNOSIS — G4733 Obstructive sleep apnea (adult) (pediatric): Secondary | ICD-10-CM | POA: Diagnosis not present

## 2018-07-09 DIAGNOSIS — Z9989 Dependence on other enabling machines and devices: Secondary | ICD-10-CM | POA: Diagnosis not present

## 2018-07-09 DIAGNOSIS — I2510 Atherosclerotic heart disease of native coronary artery without angina pectoris: Secondary | ICD-10-CM | POA: Diagnosis not present

## 2018-07-09 DIAGNOSIS — E7800 Pure hypercholesterolemia, unspecified: Secondary | ICD-10-CM | POA: Diagnosis not present

## 2018-07-09 DIAGNOSIS — E669 Obesity, unspecified: Secondary | ICD-10-CM | POA: Diagnosis not present

## 2018-07-09 DIAGNOSIS — E118 Type 2 diabetes mellitus with unspecified complications: Secondary | ICD-10-CM | POA: Diagnosis not present

## 2018-07-19 DIAGNOSIS — M533 Sacrococcygeal disorders, not elsewhere classified: Secondary | ICD-10-CM | POA: Diagnosis not present

## 2018-07-19 DIAGNOSIS — G8929 Other chronic pain: Secondary | ICD-10-CM | POA: Diagnosis not present

## 2018-07-19 DIAGNOSIS — M545 Low back pain: Secondary | ICD-10-CM | POA: Diagnosis not present

## 2018-08-20 MED ORDER — LISINOPRIL 5 MG PO TABS: 5.0000 mg | ORAL_TABLET | Freq: Every day | ORAL | 3 refills | Status: DC

## 2018-09-11 DIAGNOSIS — G4733 Obstructive sleep apnea (adult) (pediatric): Secondary | ICD-10-CM | POA: Diagnosis not present

## 2018-09-21 ENCOUNTER — Ambulatory Visit: Payer: PPO | Admitting: Internal Medicine

## 2018-10-01 ENCOUNTER — Encounter: Payer: Self-pay | Admitting: Internal Medicine

## 2018-10-01 ENCOUNTER — Ambulatory Visit: Payer: PPO | Admitting: Internal Medicine

## 2018-10-01 VITALS — BP 124/64 | HR 70 | Ht 69.0 in | Wt 236.6 lb

## 2018-10-01 DIAGNOSIS — E785 Hyperlipidemia, unspecified: Secondary | ICD-10-CM | POA: Diagnosis not present

## 2018-10-01 DIAGNOSIS — I2510 Atherosclerotic heart disease of native coronary artery without angina pectoris: Secondary | ICD-10-CM

## 2018-10-01 DIAGNOSIS — I739 Peripheral vascular disease, unspecified: Secondary | ICD-10-CM

## 2018-10-01 DIAGNOSIS — I350 Nonrheumatic aortic (valve) stenosis: Secondary | ICD-10-CM | POA: Diagnosis not present

## 2018-10-01 DIAGNOSIS — Z952 Presence of prosthetic heart valve: Secondary | ICD-10-CM | POA: Diagnosis not present

## 2018-10-01 DIAGNOSIS — I779 Disorder of arteries and arterioles, unspecified: Secondary | ICD-10-CM

## 2018-10-01 NOTE — Progress Notes (Signed)
Follow-up Outpatient Visit Date: 10/01/2018  Primary Care Provider: Karlene Einstein, MD [place] [address]  Chief Complaint: Follow up coronary artery disease and aortic stenosis  HPI:  Theodore Jenkins is a 72 y.o. year-old male with history of coronary artery disease status post PCI to the LAD in 2005 and CABG (LIMA to LAD and SVG to OM) in 2018, severe aortic stenosis status post bioprosthetic aortic valve replacement (2018), cerebrovascular disease status post right carotid endarterectomy with subsequent right internal carotid artery occlusion, and type 2 diabetes mellitus, who presents for follow-up of coronary artery disease and aortic stenosis.  I last saw Theodore Jenkins in June, at which time he was doing well other than mild swelling ini the right leg (SVG harvest site) and fatigue on very hot days.  Today, Theodore Jenkins reports feeling well.  He denies chest pain, shortness of breath and palpitations.  He notes rare episodes of brief lightheadedness, much improved following aortic valve surgery.  He continues to exercise regularly without limitations.  He attributes interval weight gain to dietary indiscretion, particularly during a recent trip to the beach.  --------------------------------------------------------------------------------------------------  Cardiovascular History & Procedures: Cardiovascular Problems:  Coronary artery diseasestatus post PCI and subsequent CABG  Aortic stenosisstatus post bioprosthetic AVR  Cerebrovascular disease  Risk Factors:  Known coronary artery disease, cerebrovascular disease, hypertension, hyperlipidemia, diabetes mellitus.  Cath/PCI:  LHC/RHC (08/25/17): LMCA normal. LAD with 40% proximal disease and 60% mid vessel in-stent restenosis. LCx with sequential 30 and 40% lesions as well as 80-90% proximal OM 2 stenosis. RCA with 25% proximal and mid disease as well as severe diffuse disease of the PDA with left-to-right  collaterals. LVEDP 13 mmHg. Mean aortic valve gradient 66 mmHg with a valve area of 0.8 cm. RA 3, RV 28/5, PA 28/7 (14), PCWP 9. AO sat 98%, PA sat 72%, RA sat 71%. Fick CO/CI 6.1/2.8.  LHC/PCI (11/12/04): LMCA normal. LAD with diffuse luminal irregularities with 60-70% mid and 90-95% distal disease. LCx with 2 large OM branches and 60-70% stenosis involving OM1. Large, dominant RCA with diffuse luminal irregularities. RPDA with severe diffuse disease. Successful PCI tomid anddistal LAD with placement of overlappingCypherstents.  CV Surgery:  CABG/AVR (10/26/17): LIMA to LAD and SVG to OM. Bioprosthetic aortic valve replacement with EdwardsMagna Ease25 mm pericardial tissue valve  Right carotid endarterectomy (10/29/02)  EP Procedures and Devices:  None  Non-Invasive Evaluation(s):  TTE (05/30/18): Normal LV size with moderate LVH.  LVEF 55-60% with normal wall motion.  Normal AVR function (mean gradient 7 mmHg).  Trivial MR.  Mildly dilated RV with normal function.  TTE (08/15/17): Normal LV size with mild LVH. LVEF 55-60% with grade 1 diastolic dysfunction. Severe aortic stenosis (mean gradient 42 mmHg) with trace aortic regurgitation. Mitral annular calcification. Mild aortic root enlargement. Mild left atrial enlargement. Normal RV size and function.  Pharmacologic MPI (02/14/17): Low risk study without evidence of ischemia or scar. LVEF 55%.  Recent CV Pertinent Labs: Lab Results  Component Value Date   CHOL 105 05/07/2013   HDL 35.80 (L) 05/07/2013   LDLCALC 47 05/07/2013   TRIG 110.0 05/07/2013   CHOLHDL 3 05/07/2013   INR 1.49 10/26/2017   K 3.2 (L) 10/31/2017   MG 2.0 10/27/2017   BUN 24 (H) 10/31/2017   BUN 22 08/18/2017   CREATININE 1.06 10/31/2017    Past medical and surgical history were reviewed and updated in EPIC.  Current Meds  Medication Sig  . acetaminophen (TYLENOL)  500 MG tablet Take 1,000 mg by mouth every 8 (eight) hours as needed for headache.  [name]  amoxicillin (AMOXIL) 500 MG tablet Take 1 tablet by mouth as needed.  [name] aspirin 81 MG EC tablet Take 1 tablet (81 mg total) by mouth daily.  . cetirizine (ZYRTEC) 10 MG tablet Take 10 mg by mouth daily.   . fluticasone (FLONASE) 50 MCG/ACT nasal spray Place 2 sprays into both nostrils daily as needed for allergies or rhinitis.   [name] gabapentin (NEURONTIN) 300 MG capsule Take 600 mg by mouth daily.  [name] lisinopril (PRINIVIL,ZESTRIL) 5 MG tablet Take 1 tablet (5 mg total) by mouth daily.  . metoprolol succinate (TOPROL-XL) 50 MG 24 hr tablet Take 50 mg by mouth daily. Take with or immediately following a meal.  . Multiple Vitamins-Minerals (ONE-A-DAY 50 PLUS PO) Take 1 tablet by mouth daily.  [name] omeprazole (PRILOSEC OTC) 20 MG tablet Take 20 mg by mouth daily.    Glory Rosebush VERIO test strip TEST BS ONCE D  . rosuvastatin (CRESTOR) 10 MG tablet Take 10 mg by mouth daily.  . sitaGLIPtin-metformin (JANUMET) 50-1000 MG tablet Take 1 tablet by mouth 2 (two) times daily with a meal.     Allergies: Oxycodone  Social History   Tobacco Use  . Smoking status: Former Smoker    Packs/day: 1.00    Years: 20.00    Pack years: 20.00    Types: Cigarettes    Last attempt to quit: 12/12/1992    Years since quitting: 25.8  . Smokeless tobacco: Never Used  Substance Use Topics  . Alcohol use: Yes    Alcohol/week: 1.0 standard drinks    Types: 1 Cans of beer per week  . Drug use: No    Family History  Problem Relation Age of Onset  . Cancer Mother   . Heart attack Father 68  . CAD Brother     Review of Systems: A 12-system review of systems was performed and was negative except as noted in the HPI.  --------------------------------------------------------------------------------------------------  Physical Exam: BP 124/64   Pulse 70   Ht 5\' 9"  (1.753 m)   Wt 236 lb 9.6 oz (107.3 kg)   SpO2 97%   BMI 34.94 kg/m   General:  NAD. HEENT: No conjunctival pallor or scleral icterus. Moist mucous  membranes.  OP clear. Neck: Supple without lymphadenopathy, thyromegaly, JVD, or HJR. No carotid bruit. Lungs: Normal work of breathing. Clear to auscultation bilaterally without wheezes or crackles. Heart: Regular rate and rhythm with 2/6 systolic murmur.  No rubs or gallops.  Non-displaced PMI.[name] Abd: Bowel sounds present. Soft, NT/ND without hepatosplenomegaly Ext: No lower extremity edema.  Skin: Warm and dry without rash.   Lab Results  Component Value Date   WBC 14.0 (H) 10/30/2017   HGB 9.4 (L) 10/30/2017   HCT 28.0 (L) 10/30/2017   MCV 94.9 10/30/2017   PLT 150 10/30/2017    Lab Results  Component Value Date   NA 131 (L) 10/31/2017   K 3.2 (L) 10/31/2017   CL 95 (L) 10/31/2017   CO2 28 10/31/2017   BUN 24 (H) 10/31/2017   CREATININE 1.06 10/31/2017   GLUCOSE 158 (H) 10/31/2017   ALT 56 10/30/2017    Lab Results  Component Value Date   CHOL 105 05/07/2013   HDL 35.80 (L) 05/07/2013   LDLCALC 47 05/07/2013   TRIG 110.0 05/07/2013   CHOLHDL 3 05/07/2013    Outside labs (07/09/18): Lipid panel: Total cholesterol 115,  HDL 40, directLDL 68, triglycerides 100  BMP: Na 134, K 4.5, Cl 99, CO2 27, BUN 18, creatinine 1.2, glucose 118, Ca 9.9  --------------------------------------------------------------------------------------------------  ASSESSMENT AND PLAN: Aortic stenosis status post bioprosthetic AVR No symptoms following AVR.  Echo in 05/2018 showed appropriate valve function.  Continue ASA 81 mg daily and SBE prophylaxis for dental procedures.  CAD No angina.  Continue secondary prevention, including ASA and rosuvastatin.  Hyperlipidemia LDL at goal on recent outside labs (direct LDL 68).  Given significant myalgias in the past with simvastatin and LDL at goal, we will continue with rosuvastatin 10 mg daily.  Carotid artery stenosis Continue secondary prevention and follow-up with vascular surgery.  Follow-up: Return to clinic in 6 months to see Dr.  Angelena Form, given my transition to the [facility] office.  Nelva Bush, MD 10/01/2018 10:35 AM

## 2018-10-01 NOTE — Patient Instructions (Signed)
Medication Instructions:  none If you need a refill on your cardiac medications before your next appointment, please call your pharmacy.   Lab work: none If you have labs (blood work) drawn today and your tests are completely normal, you will receive your results only by: . MyChart Message (if you have MyChart) OR . A paper copy in the mail If you have any lab test that is abnormal or we need to change your treatment, we will call you to review the results.  Testing/Procedures: none  Follow-Up: At [facility], you and your health needs are our priority.  As part of our continuing mission to provide you with exceptional heart care, we have created designated Provider Care Teams.  These Care Teams include your primary Cardiologist (physician) and Advanced Practice Providers (APPs -  Physician Assistants and Nurse Practitioners) who all work together to provide you with the care you need, when you need it. You will need a follow up appointment in 6 months.  Please call our office 2 months in advance to schedule this appointment.  You may see Christopher McAlhany, MD or one of the following Advanced Practice Providers on your designated Care Team:   Brittainy Simmons, PA-C Dayna Dunn, PA-C . Michele Lenze, PA-C  Any Other Special Instructions Will Be Listed Below (If Applicable).    

## 2018-10-22 ENCOUNTER — Ambulatory Visit: Payer: PPO | Admitting: Thoracic Surgery (Cardiothoracic Vascular Surgery)

## 2018-10-22 ENCOUNTER — Other Ambulatory Visit: Payer: Self-pay

## 2018-10-22 ENCOUNTER — Encounter: Payer: Self-pay | Admitting: Thoracic Surgery (Cardiothoracic Vascular Surgery)

## 2018-10-22 VITALS — BP 122/62 | HR 73 | Resp 18 | Ht 69.0 in | Wt 232.6 lb

## 2018-10-22 DIAGNOSIS — Z951 Presence of aortocoronary bypass graft: Secondary | ICD-10-CM

## 2018-10-22 DIAGNOSIS — Z953 Presence of xenogenic heart valve: Secondary | ICD-10-CM

## 2018-10-22 NOTE — Patient Instructions (Addendum)
Continue all previous medications without any changes at this time  Make every effort to stay physically active, get some type of exercise on a regular basis, and stick to a "heart healthy diet".  The long term benefits for regular exercise and a healthy diet are critically important to your overall health and wellbeing.  Endocarditis is a potentially serious infection of heart valves or inside lining of the heart.  It occurs more commonly in patients with diseased heart valves (such as patient's with aortic or mitral valve disease) and in patients who have undergone heart valve repair or replacement.  Certain surgical and dental procedures may put you at risk, such as dental cleaning, other dental procedures, or any surgery involving the respiratory, urinary, gastrointestinal tract, gallbladder or prostate gland.   To minimize your chances for develooping endocarditis, maintain good oral health and seek prompt medical attention for any infections involving the mouth, teeth, gums, skin or urinary tract.    Always notify your doctor or dentist about your underlying heart valve condition before having any invasive procedures. You will need to take antibiotics before certain procedures, including all routine dental cleanings or other dental procedures.  Your cardiologist or dentist should prescribe these antibiotics for you to be taken ahead of time.

## 2018-10-22 NOTE — Progress Notes (Signed)
[address]       [place] [address]             [phone]     CARDIOTHORACIC SURGERY OFFICE NOTE  Referring Provider is End, Harrell Gave, MD PCP is Karlene Einstein, MD   HPI:  Patient is a 72 year old male with history of aortic stenosis, coronary artery disease, hypertension, type 2 diabetes mellitus, cerebrovascular disease, Barrett's esophagitis, and obstructive sleep apnea who returns to the office today for routine follow-up status post aortic valve replacement using a bioprosthetic tissue valve and coronary artery bypass grafting x2 on October 26, 2017 for severe symptomatic aortic stenosis and multivessel coronary artery disease.  The patient's early postoperative recovery was uneventful and he was last seen here in our office on 03/19/2018.  He returns the office today and reports that he is doing very well.  He is back to completely normal physical activity and he makes an effort to exercise on a daily basis.  He states that overall he feels "much improved" in comparison with how he felt prior to surgery.  He states that he no longer has any symptoms of exertional shortness of breath or chest discomfort.  He reports no physical limitations other than he states that his knees are a little stiff.  Overall he is delighted with his progress.   Current Outpatient Medications  Medication Sig Dispense Refill  . acetaminophen (TYLENOL) 500 MG tablet Take 1,000 mg by mouth every 8 (eight) hours as needed for headache.    [name] amoxicillin (AMOXIL) 500 MG tablet Take 1 tablet by mouth as needed.  0  . aspirin 81 MG EC tablet Take 1 tablet (81 mg total) by mouth daily.    . cetirizine (ZYRTEC) 10 MG tablet Take 10 mg by mouth daily.     . fluticasone (FLONASE) 50 MCG/ACT nasal spray Place 2 sprays into both nostrils daily as needed for allergies or rhinitis.     [name] gabapentin (NEURONTIN) 300 MG capsule Take 600 mg by mouth daily.    [name] lisinopril (PRINIVIL,ZESTRIL) 5 MG  tablet Take 1 tablet (5 mg total) by mouth daily. 90 tablet 3  . metoprolol succinate (TOPROL-XL) 50 MG 24 hr tablet Take 50 mg by mouth daily. Take with or immediately following a meal.    . Multiple Vitamins-Minerals (ONE-A-DAY 50 PLUS PO) Take 1 tablet by mouth daily.    [name] omeprazole (PRILOSEC OTC) 20 MG tablet Take 20 mg by mouth daily.      Glory Rosebush VERIO test strip TEST BS ONCE D  3  . rosuvastatin (CRESTOR) 10 MG tablet Take 10 mg by mouth daily.    . sitaGLIPtin-metformin (JANUMET) 50-1000 MG tablet Take 1 tablet by mouth 2 (two) times daily with a meal.     . nitroGLYCERIN (NITROSTAT) 0.4 MG SL tablet Place 1 tablet (0.4 mg total) under the tongue every 5 (five) minutes as needed for chest pain. 30 tablet 3   No current facility-administered medications for this visit.       Physical Exam:   BP 122/62 (BP Location: Left Arm, Patient Position: Sitting, Cuff Size: Normal)   Pulse 73   Resp 18   Ht 5\' 9"  (1.753 m)   Wt 232 lb 9.6 oz (105.5 kg)   SpO2 95% Comment: ra  BMI 34.35 kg/m   General:  Well-appearing  Chest:   Clear to auscultation  CV:   Regular rate and rhythm without murmur  Incisions:  Completely healed  Abdomen:  Soft nontender  Extremities:  Warm and well-perfused  Diagnostic Tests:  Transthoracic Echocardiography  Patient:    Theodore Jenkins, Theodore Jenkins MR #:       [id number] Study Date: 05/30/2018 Gender:     M Age:        72 Height:     175.3 cm Weight:     104.3 kg BSA:        2.29 m^2 Pt. Status: Room:   SONOGRAPHER  Diamond Nickel  ATTENDING    Nelva Bush, MD  ORDERING     Nelva Bush, MD  REFERRING    Nelva Bush, MD  PERFORMING   [facility], Outpatient  cc:  ------------------------------------------------------------------- LV EF: 55% -   60%  ------------------------------------------------------------------- Indications:      I35.0 Aortic valve  stenosis.  ------------------------------------------------------------------- History:   PMH:  Bioprosthetic aortic valve replacement.  Coronary artery disease.  Risk factors:  Hypertension. Diabetes mellitus. Dyslipidemia.  ------------------------------------------------------------------- Study Conclusions  - Left ventricle: The cavity size was normal. Wall thickness was   increased in a pattern of moderate LVH. Systolic function was   normal. The estimated ejection fraction was in the range of 55%   to 60%. Wall motion was normal; there were no regional wall   motion abnormalities. There was no evidence of elevated   ventricular filling pressure by Doppler parameters. - Ventricular septum: Septal motion showed &quot;bounce&quot;. - Aortic valve: Edwards Magna Ease 25 mm pericardial tissue valve.   Functioning well. There was no regurgitation. Mean gradient (S):   7 mm Hg. - Mitral valve: There was trivial regurgitation. - Left atrium: The atrium was mildly dilated. - Right ventricle: The cavity size was mildly dilated. Wall   thickness was normal. - Right atrium: The atrium was mildly dilated.  ------------------------------------------------------------------- Labs, prior tests, procedures, and surgery: Coronary artery bypass grafting.  ------------------------------------------------------------------- Study data:  Comparison was made to the study of 10/26/2017.  Study status:  Routine.  Procedure:  The patient reported no pain pre or post test. Transthoracic echocardiography. Image quality was adequate.  Study completion:  There were no complications. Transthoracic echocardiography.  M-mode, complete 2D, spectral Doppler, and color Doppler.  Birthdate:  Patient birthdate: [birth year].  Age:  Patient is 72 yr old.  Sex:  Gender: male. BMI: 33.9 kg/m^2.  Blood pressure:     108/66  Patient status: Outpatient.  Study date:  Study date: 05/30/2018. Study time:  08:31 AM.  Location:  [facility]  -------------------------------------------------------------------  ------------------------------------------------------------------- Left ventricle:  The cavity size was normal. Wall thickness was increased in a pattern of moderate LVH. Systolic function was normal. The estimated ejection fraction was in the range of 55% to 60%. Wall motion was normal; there were no regional wall motion abnormalities. There was no evidence of elevated ventricular filling pressure by Doppler parameters.  ------------------------------------------------------------------- Aortic valve:  Edwards Magna Ease 25 mm pericardial tissue valve. Functioning well. Mobility was not restricted.  Doppler:  There was no regurgitation.    VTI ratio of LVOT to aortic valve: 0.58. Peak velocity ratio of LVOT to aortic valve: 0.69. Mean velocity ratio of LVOT to aortic valve: 0.59.    Mean gradient (S): 7 mm Hg. Peak gradient (S): 12 mm Hg.  ------------------------------------------------------------------- Aorta:  Aortic root: The aortic root was normal in size.  ------------------------------------------------------------------- Mitral valve:   Structurally normal valve.   Mobility was not restricted.  Doppler:  Transvalvular velocity was within the normal range.  There was no evidence for stenosis. There was trivial regurgitation.    Peak gradient (D): 3 mm Hg.  ------------------------------------------------------------------- Left atrium:  The atrium was mildly dilated.  ------------------------------------------------------------------- Right ventricle:  The cavity size was mildly dilated. Wall thickness was normal. Systolic function was normal.  ------------------------------------------------------------------- Ventricular septum:   Septal motion showed &quot;bounce&quot;.  ------------------------------------------------------------------- Pulmonic  valve:   Poorly visualized.  Structurally normal valve. Cusp separation was normal.  Doppler:  Transvalvular velocity was within the normal range. There was no evidence for stenosis. There was no regurgitation.  ------------------------------------------------------------------- Tricuspid valve:   Structurally normal valve.    Doppler: Transvalvular velocity was within the normal range. There was mild regurgitation.  ------------------------------------------------------------------- Pulmonary artery:   The main pulmonary artery was normal-sized. Systolic pressure was within the normal range.  ------------------------------------------------------------------- Right atrium:  The atrium was mildly dilated.  ------------------------------------------------------------------- Pericardium:  There was no pericardial effusion.  ------------------------------------------------------------------- Systemic veins: Inferior vena cava: The vessel was normal in size.  ------------------------------------------------------------------- Measurements   Left ventricle                         Value        Reference  LV ID, ED, PLAX chordal        (L)     38.8  mm     43 - 52  LV ID, ES, PLAX chordal                26    mm     23 - 38  LV fx shortening, PLAX chordal         33    %      >=29  LV PW thickness, ED                    17    mm     ----------  IVS/LV PW ratio, ED                    1.01         <=1.3  LV e&', lateral                         9.68  cm/s   ----------  LV E/e&', lateral                       9.02         ----------  LV e&', medial                          6.53  cm/s   ----------  LV E/e&', medial                        13.37        ----------  LV e&', average                         8.11  cm/s   ----------  LV E/e&', average                       10.77        ----------    Ventricular septum                     Value  Reference  IVS thickness, ED                       17.1  mm     ----------    LVOT                                   Value        Reference  LVOT peak velocity, S                  118   cm/s   ----------  LVOT mean velocity, S                  71.6  cm/s   ----------  LVOT VTI, S                            23    cm     ----------  LVOT peak gradient, S                  6     mm Hg  ----------    Aortic valve                           Value        Reference  Aortic valve peak velocity, S          171   cm/s   ----------  Aortic valve mean velocity, S          122   cm/s   ----------  Aortic valve VTI, S                    39.8  cm     ----------  Aortic mean gradient, S                7     mm Hg  ----------  Aortic peak gradient, S                12    mm Hg  ----------  VTI ratio, LVOT/AV                     0.58         ----------  Velocity ratio, peak, LVOT/AV          0.69         ----------  Velocity ratio, mean, LVOT/AV          0.59         ----------    Left atrium                            Value        Reference  LA ID, A-P, ES                         43    mm     ----------  LA ID/bsa, A-P                         1.88  cm/m^2 <=2.2  LA volume, S  74.3  ml     ----------  LA volume/bsa, S                       32.4  ml/m^2 ----------  LA volume, ES, 1-p A4C                 71    ml     ----------  LA volume/bsa, ES, 1-p A4C             31    ml/m^2 ----------  LA volume, ES, 1-p A2C                 74.5  ml     ----------  LA volume/bsa, ES, 1-p A2C             32.5  ml/m^2 ----------    Mitral valve                           Value        Reference  Mitral E-wave peak velocity            87.3  cm/s   ----------  Mitral A-wave peak velocity            76.2  cm/s   ----------  Mitral deceleration time               222   ms     150 - 230  Mitral peak gradient, D                3     mm Hg  ----------  Mitral E/A ratio, peak                 1.1          ----------    Pulmonary arteries                      Value        Reference  PA pressure, S, DP                     23    mm Hg  <=30    Tricuspid valve                        Value        Reference  Tricuspid regurg peak velocity         223   cm/s   ----------  Tricuspid peak RV-RA gradient          20    mm Hg  ----------    Right atrium                           Value        Reference  RA ID, S-I, ES, A4C            (H)     57.2  mm     34 - 49  RA area, ES, A4C               (H)     23.7  cm^2   8.3 - 19.5  RA volume, ES, A/L                     81.4  ml     ----------  RA volume/bsa, ES, A/L                 35.5  ml/m^2 ----------    Systemic veins                         Value        Reference  Estimated CVP                          3     mm Hg  ----------    Right ventricle                        Value        Reference  RV pressure, S, DP                     23    mm Hg  <=30  RV s&', lateral, S                      7.07  cm/s   ----------  Legend: (L)  and  (H)  mark values outside specified reference range.  ------------------------------------------------------------------- Prepared and Electronically Authenticated by  Candee Furbish, M.D. 2019-06-19T10:56:11   Impression:  Patient is doing very well approximately 1 year status post aortic valve replacement using a bioprosthetic tissue valve and coronary artery bypass grafting x2.  He denies any symptoms of exertional shortness of breath or chest discomfort in his activity level is completely back to normal.  Routine follow-up echocardiogram performed last June demonstrates normal left ventricular systolic function with normal functioning bioprosthetic tissue valve in aortic position.    Plan:  We have not recommended any changes to the patient's current medications.  I have encouraged the patient to continue to increase his activity without any limitations.  The patient has been reminded regarding the importance of dental hygiene and the lifelong need  for antibiotic prophylaxis for all dental cleanings and other related invasive procedures.  Patient states that he is now going to follow-up with Dr. Angelena Form since Dr. Saunders Revel will no longer be seeing patients in [place].  The patient has also requested referral to a vascular surgeon in [place].  He previously had been followed by a vascular surgeon in [facility] for known history of bilateral carotid artery disease status post carotid endarterectomy in the remote past.  All of his questions have been answered.  The future he will return to our office only should specific problems or questions arise.  I spent in excess of 15 minutes during the conduct of this office consultation and >50% of this time involved direct face-to-face encounter with the patient for counseling and/or coordination of their care.    Valentina Gu. Roxy Manns, MD 10/22/2018 3:23 PM

## 2018-10-26 ENCOUNTER — Other Ambulatory Visit: Payer: Self-pay

## 2018-10-26 DIAGNOSIS — I739 Peripheral vascular disease, unspecified: Secondary | ICD-10-CM

## 2018-10-26 DIAGNOSIS — I6529 Occlusion and stenosis of unspecified carotid artery: Secondary | ICD-10-CM

## 2018-12-14 DIAGNOSIS — G4733 Obstructive sleep apnea (adult) (pediatric): Secondary | ICD-10-CM | POA: Diagnosis not present

## 2018-12-24 ENCOUNTER — Ambulatory Visit (HOSPITAL_COMMUNITY)
Admission: RE | Admit: 2018-12-24 | Discharge: 2018-12-24 | Disposition: A | Discharge status: Home / Self Care | Payer: PPO | Source: Ambulatory Visit | Attending: Surgery | Admitting: Surgery

## 2018-12-24 ENCOUNTER — Ambulatory Visit (INDEPENDENT_AMBULATORY_CARE_PROVIDER_SITE_OTHER): Payer: PPO | Admitting: Surgery

## 2018-12-24 ENCOUNTER — Other Ambulatory Visit: Payer: Self-pay

## 2018-12-24 ENCOUNTER — Ambulatory Visit (INDEPENDENT_AMBULATORY_CARE_PROVIDER_SITE_OTHER)
Admission: RE | Admit: 2018-12-24 | Discharge: 2018-12-24 | Disposition: A | Discharge status: Home / Self Care | Payer: PPO | Source: Ambulatory Visit | Attending: Surgery | Admitting: Surgery

## 2018-12-24 ENCOUNTER — Encounter: Payer: Self-pay | Admitting: Surgery

## 2018-12-24 VITALS — BP 117/72 | HR 61 | Temp 97.6°F | Resp 18 | Ht 69.0 in | Wt 229.0 lb

## 2018-12-24 DIAGNOSIS — I6523 Occlusion and stenosis of bilateral carotid arteries: Secondary | ICD-10-CM | POA: Diagnosis not present

## 2018-12-24 DIAGNOSIS — I6529 Occlusion and stenosis of unspecified carotid artery: Secondary | ICD-10-CM | POA: Diagnosis not present

## 2018-12-24 DIAGNOSIS — I739 Peripheral vascular disease, unspecified: Secondary | ICD-10-CM | POA: Diagnosis not present

## 2018-12-24 NOTE — Progress Notes (Signed)
[facility] [place]  Patient name: Theodore Jenkins MRN: [id number] DOB: 10-Sep-1946 Sex: male   REQUESTING PROVIDER:    Dr. Roxy Manns   REASON FOR CONSULT:    Carotid stenosis  HISTORY OF PRESENT ILLNESS:   Theodore Jenkins is a 73 y.o. male, who is status post aortic valve replacement and CABG x2 on October 26, 2017.  Patient has a history of hypertension which is managed with an ACE inhibitor.  He takes a statin for hypercholesterolemia.  He is a type II diabetic.  He has a history of Barrett's esophagus as well as obstructive sleep apnea.  The patient has a history of carotid endarterectomy by Dr. Amedeo Plenty in 2003.  This is a very difficult operation.  He had a vein patch performed the plaque went way up towards the skull base.  By report, this occluded in the early postoperative period.  Fortunately, the patient has remained asymptomatic.  PAST MEDICAL HISTORY    Past Medical History:  Diagnosis Date  . Aortic stenosis    Echo (08/2017): Severe AS with mean gradient of 42 mmHg.  . Barrett's esophagus   . CAD (coronary artery disease) 06/06/2011   Remote stent to the LAD in 2005 // Last cath in 2010 showing diffuse CAD, small PD that is occluded and fills with left to right collaterals, 60-70% 1st OM, with normal EF. Managed medically // Myoview 3/18:  Normal perfusion. LVEF 55% with normal wall motion. This is a low risk study.  . Complication of anesthesia   . Diabetes mellitus   . ED (erectile dysfunction)   . Family history of adverse reaction to anesthesia 10/23/2017   sister had history of malignant hyperthermia when she was 57, she is 71 years old now  . GERD (gastroesophageal reflux disease)   . Hyperlipidemia   . Hypertension   . IHD (ischemic heart disease)    prior stenting of the LAD in December of 2005; with last cath in 2010 showing diffuse CAD with normal LV function. He is managed medically.   . Malignant hyperthermia     sister had reaction concerning for malignant hyperthermia (~ 1988)  . Neuromuscular disorder ([place]) 10/23/2017   lumbar 4 disc in back causing nerve pain, on gabapentin  . Obesity   . Obstructive sleep apnea on CPAP   . PONV (postoperative nausea and vomiting)   . S/P aortic valve replacement with bioprosthetic valve 10/26/2017   25 mm [facility] Ease bovine pericardial tissue valve  . S/P coronary artery stent placement 2005   LAD     FAMILY HISTORY   Family History  Problem Relation Age of Onset  . Cancer Mother   . Heart attack Father 31  . CAD Brother     SOCIAL HISTORY:   Social History   Socioeconomic History  . Marital status: Married    Spouse name: Not on file  . Number of children: 3  . Years of education: Not on file  . Highest education level: Not on file  Occupational History  . Occupation: Retired-[employer]  . Financial resource strain: Not on file  . Food insecurity:    Worry: Not on file    Inability: Not on file  . Transportation needs:    Medical: Not on file    Non-medical: Not on file  Tobacco Use  . Smoking status: Former Smoker    Packs/day: 1.00    Years: 20.00  Pack years: 20.00    Types: Cigarettes    Last attempt to quit: 12/12/1992    Years since quitting: 26.0  . Smokeless tobacco: Never Used  Substance and Sexual Activity  . Alcohol use: Yes    Alcohol/week: 1.0 standard drinks    Types: 1 Cans of beer per week  . Drug use: No  . Sexual activity: Not Currently  Lifestyle  . Physical activity:    Days per week: Not on file    Minutes per session: Not on file  . Stress: Not on file  Relationships  . Social connections:    Talks on phone: Not on file    Gets together: Not on file    Attends religious service: Not on file    Active member of club or organization: Not on file    Attends meetings of clubs or organizations: Not on file    Relationship status: Not on file  . Intimate partner  violence:    Fear of current or ex partner: Not on file    Emotionally abused: Not on file    Physically abused: Not on file    Forced sexual activity: Not on file  Other Topics Concern  . Not on file  Social History Narrative  . Not on file    ALLERGIES:    Allergies  Allergen Reactions  . Oxycodone Nausea And Vomiting    CURRENT MEDICATIONS:    Current Outpatient Medications  Medication Sig Dispense Refill  . acetaminophen (TYLENOL) 500 MG tablet Take 1,000 mg by mouth every 8 (eight) hours as needed for headache.    [name] aspirin 81 MG EC tablet Take 1 tablet (81 mg total) by mouth daily.    . cetirizine (ZYRTEC) 10 MG tablet Take 10 mg by mouth daily.     . fluticasone (FLONASE) 50 MCG/ACT nasal spray Place 2 sprays into both nostrils daily as needed for allergies or rhinitis.     [name] gabapentin (NEURONTIN) 300 MG capsule Take 600 mg by mouth daily.    [name] lisinopril (PRINIVIL,ZESTRIL) 5 MG tablet Take 1 tablet (5 mg total) by mouth daily. 90 tablet 3  . metoprolol succinate (TOPROL-XL) 50 MG 24 hr tablet Take 50 mg by mouth daily. Take with or immediately following a meal.    . Multiple Vitamins-Minerals (ONE-A-DAY 50 PLUS PO) Take 1 tablet by mouth daily.    [name] omeprazole (PRILOSEC OTC) 20 MG tablet Take 20 mg by mouth daily.      Glory Rosebush VERIO test strip TEST BS ONCE D  3  . rosuvastatin (CRESTOR) 10 MG tablet Take 10 mg by mouth daily.    . sitaGLIPtin-metformin (JANUMET) 50-1000 MG tablet Take 1 tablet by mouth 2 (two) times daily with a meal.     . amoxicillin (AMOXIL) 500 MG tablet Take 1 tablet by mouth as needed.  0  . nitroGLYCERIN (NITROSTAT) 0.4 MG SL tablet Place 1 tablet (0.4 mg total) under the tongue every 5 (five) minutes as needed for chest pain. 30 tablet 3   No current facility-administered medications for this visit.     REVIEW OF SYSTEMS:   [X]  denotes positive finding, [ ]  denotes negative finding Cardiac  Comments:  Chest pain or chest pressure:     Shortness of breath upon exertion:    Short of breath when lying flat:    Irregular heart rhythm:        Vascular    Pain in calf, thigh, or hip brought  on by ambulation:    Pain in feet at night that wakes you up from your sleep:     Blood clot in your veins:    Leg swelling:         Pulmonary    Oxygen at home:    Productive cough:     Wheezing:         Neurologic    Sudden weakness in arms or legs:     Sudden numbness in arms or legs:     Sudden onset of difficulty speaking or slurred speech:    Temporary loss of vision in one eye:     Problems with dizziness:         Gastrointestinal    Blood in stool:      Vomited blood:         Genitourinary    Burning when urinating:     Blood in urine:        Psychiatric    Major depression:         Hematologic    Bleeding problems:    Problems with blood clotting too easily:        Skin    Rashes or ulcers:        Constitutional    Fever or chills:     PHYSICAL EXAM:   Vitals:   12/24/18 1130 12/24/18 1134  BP: 115/66 117/72  Pulse: 60 61  Resp: 18   Temp: 97.6 F (36.4 C)   TempSrc: Oral   SpO2: 96%   Weight: 229 lb (103.9 kg)   Height: 5\' 9"  (1.753 m)     GENERAL: The patient is a well-nourished male, in no acute distress. The vital signs are documented above. CARDIAC: There is a regular rate and rhythm.  VASCULAR: No carotid bruits PULMONARY: Nonlabored respirations ABDOMEN: Soft and non-tender with normal pitched bowel sounds.  MUSCULOSKELETAL: There are no major deformities or cyanosis. NEUROLOGIC: No focal weakness or paresthesias are detected. SKIN: There are no ulcers or rashes noted. PSYCHIATRIC: The patient has a normal affect.  STUDIES:   I have ordered and reviewed his vascular studies with the following findings:  ABI/TBIToday's ABIToday's TBIPrevious ABIPrevious TBI +-------+-----------+-----------+------------+------------+ Right  1.07       0.79       1.13        na            +-------+-----------+-----------+------------+------------+ Left   1.06       0.69       [place]          na           +-------+-----------+-----------+------------+------------+  Right toe pressure is 97.  Left toe pressure is 85  Carotid duplex: Right Carotid: The ECA appears >50% stenosed. ICA not visualized proximally due                to calcific shadowing from the ECA. ICA not visualized distally.                Cannot rule out ICA occlusion.  Left Carotid: Velocities in the left ICA are consistent with a 1-39% stenosis.  ASSESSMENT and PLAN   Carotid: 2 prior ultrasounds and reporting from the patient suggest that the right carotid is confirmed to be occluded.  He is not interested in further imaging such as a CT scan to confirm this.  There is minimal disease in the left side.  I told him that we would continue with annual  surveillance.  Should this progress, I would consider stenting given the high bifurcation as well as the difficulty encountered in treating the other side.  Lower extremity vascular disease: Patient has normal ABIs but slightly lower toe pressures suggesting diabetic involvement of the blood vessels out on his foot.  Daily foot care was recommended.  Abdominal aorta: The patient has been followed in [facility] for an abdominal aneurysm.  When I look at his CT scan from 2018, he hardly has ectasia in his aorta.  I plan on getting a repeat ultrasound in 3 years to make sure there has been no aortic change in diameter.   Annamarie Major, MD [facility] [phone] Pager [phone]

## 2019-01-17 IMAGING — CT CT HEART MORP W/ CTA COR W/ SCORE W/ CA W/CM &/OR W/O CM
2 of 11 series · 7 of 20 positions shown, 8 images · IV contrast (APPLIED)
Comparison: 08/07/2009 chest radiograph.

CLINICAL DATA: -year-old male with severe aortic stenosis being
evaluated for a TAVR procedure.

EXAM:
Cardiac TAVR CT
TECHNIQUE: The patient was scanned on a Phillips Force scanner. A 120 kV
retrospective scan was triggered in the descending thoracic aorta at
111 HU's. Gantry rotation speed was 250 msecs and collimation was .6
mm. No beta blockade or nitro were given. The 3D data set was
reconstructed in 5% intervals of the R-R cycle. Systolic and
diastolic phases were analyzed on a dedicated work station using
MPR, MIP and VRT modes. The patient received 80 cc of contrast.

[Series 8: 0-90% · axial · 0.39mm/px · z∈[+1146,+1247]mm · 3 of 3380 slices shown]
[im 845/3380  vessel]
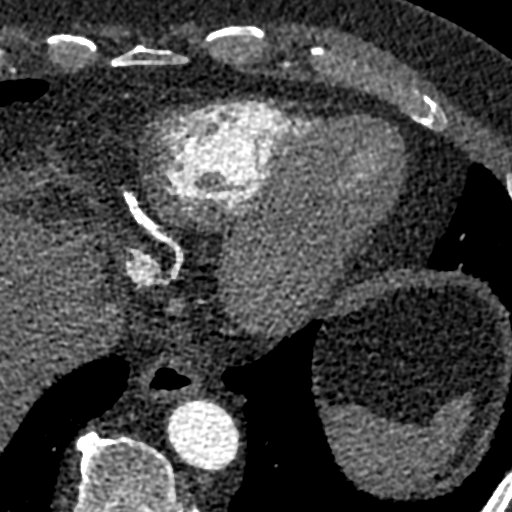
[im 1690/3380  vessel]
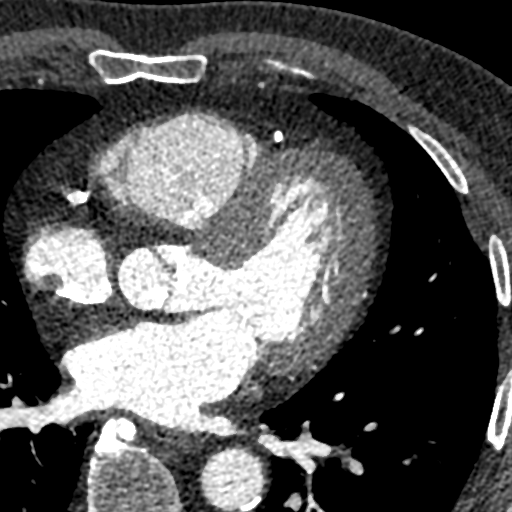
[im 2535/3380  vessel]
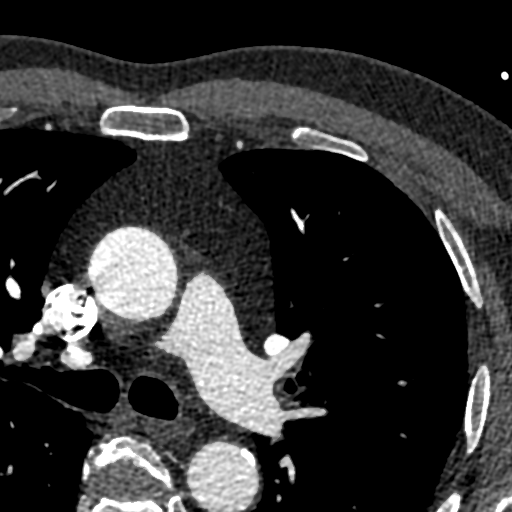

[Series 9: 5-95% · axial · 0.39mm/px · z∈[+1135,+1257]mm · 4 of 3380 slices shown, 5 images]
[im 676/3380  vessel]
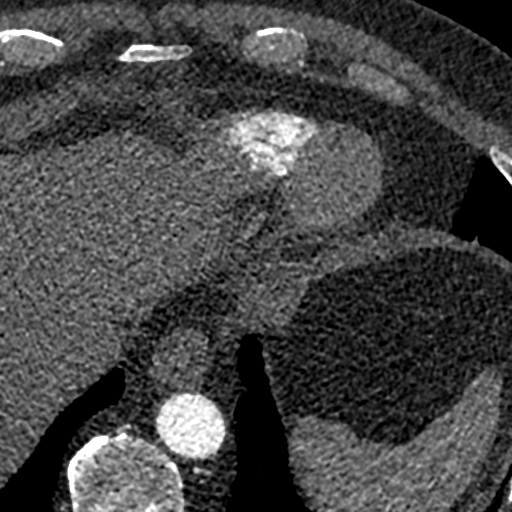
[im 676/3380  lung]
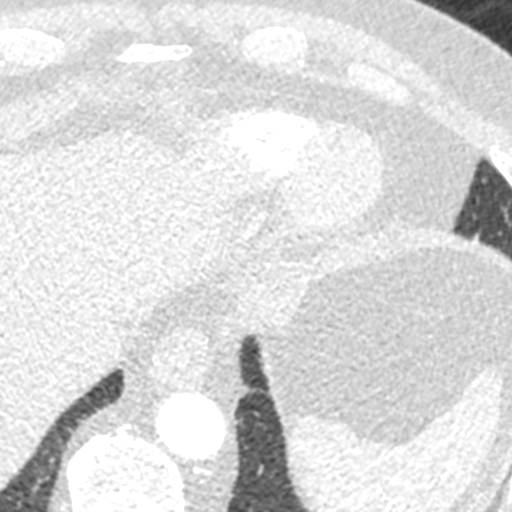
[im 1352/3380  vessel]
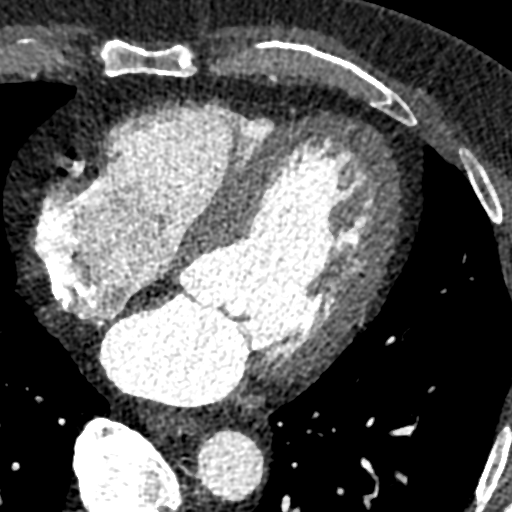
[im 2028/3380  vessel]
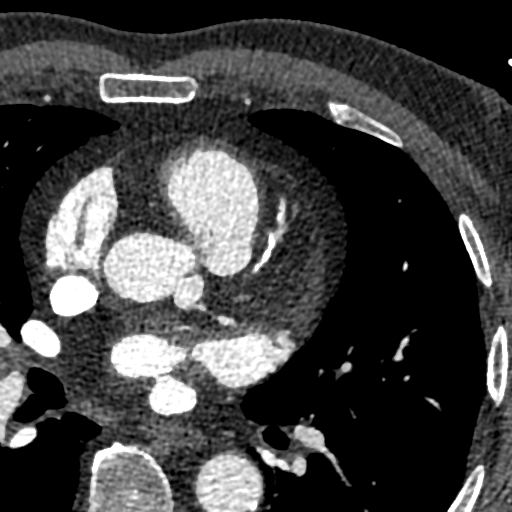
[im 2704/3380  vessel]
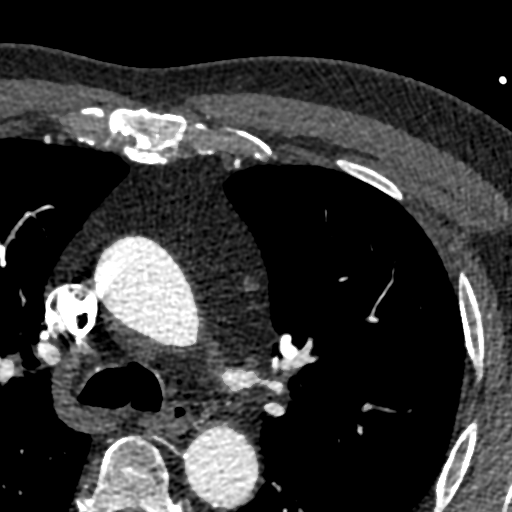

[7 of 20 positions shown; findings below may reference images not displayed]

FINDINGS: Aortic Valve: Trileaflet, severely calcified and thickened aortic
valve with severely restricted leaflet opening.

Aorta:  Normal size, mild diffuse calcifications, no dissection.

Sinotubular Junction:  26 x 26 mm

Ascending Thoracic Aorta:  33 x 33 mm

Aortic Arch:  25 x 24 mm

Descending Thoracic Aorta:  27 x 26 mm

Sinus of Valsalva Measurements:

Non-coronary:  37 mm

Right -coronary:  36 mm

Left -coronary:  36 mm

Coronary Artery Height above Annulus:

Left Main:  13 mm

Right Coronary:  14 mm

Virtual Basal Annulus Measurements:

Maximum/Minimum Diameter:  31.4 x 25.5 mm

Perimeter:  88 mm

Area:  595 mm2

Coronary Arteries:  Normal origin.
IMPRESSION: 1. Trileaflet, severely calcified and thickened aortic valve with
severely restricted leaflet opening. There are significant
calcifications extending into the LVOT.

2. Normal size of the thoracic aorta with mild diffuse
calcifications and no dissection.

3. No thrombus in the left atrial appendage.

4. No ASD or VSD.

EXAM:
OVER-READ INTERPRETATION  CT CHEST

The following report is an over-read performed by radiologist Dr.
does not include interpretation of cardiac or coronary anatomy or
pathology. The coronary calcium score/coronary CTA interpretation by
the cardiologist is attached.
FINDINGS: Cardiovascular: No significant pericardial fluid/thickening.
Three-vessel coronary atherosclerosis. Atherosclerotic nonaneurysmal
thoracic aorta. Normal caliber pulmonary arteries. No central
pulmonary emboli.

Mediastinum/Nodes: Unremarkable esophagus. No pathologically
enlarged axillary, mediastinal or hilar lymph nodes.

Lungs/Pleura: No pneumothorax. No pleural effusion. No acute
consolidative airspace disease, lung masses or significant pulmonary
nodules in the visualized chest.

Upper abdomen: No acute abnormality.

Musculoskeletal: No aggressive appearing focal osseous lesions.
Moderate thoracic spondylosis.
IMPRESSION: No significant extracardiac findings in the visualized chest.

## 2019-01-18 DIAGNOSIS — E118 Type 2 diabetes mellitus with unspecified complications: Secondary | ICD-10-CM | POA: Diagnosis not present

## 2019-01-18 DIAGNOSIS — I10 Essential (primary) hypertension: Secondary | ICD-10-CM | POA: Diagnosis not present

## 2019-01-18 DIAGNOSIS — E7800 Pure hypercholesterolemia, unspecified: Secondary | ICD-10-CM | POA: Diagnosis not present

## 2019-01-23 DIAGNOSIS — I2510 Atherosclerotic heart disease of native coronary artery without angina pectoris: Secondary | ICD-10-CM | POA: Diagnosis not present

## 2019-01-23 DIAGNOSIS — I10 Essential (primary) hypertension: Secondary | ICD-10-CM | POA: Diagnosis not present

## 2019-01-23 DIAGNOSIS — E7800 Pure hypercholesterolemia, unspecified: Secondary | ICD-10-CM | POA: Diagnosis not present

## 2019-01-23 DIAGNOSIS — G4733 Obstructive sleep apnea (adult) (pediatric): Secondary | ICD-10-CM | POA: Diagnosis not present

## 2019-01-23 DIAGNOSIS — I70203 Unspecified atherosclerosis of native arteries of extremities, bilateral legs: Secondary | ICD-10-CM | POA: Diagnosis not present

## 2019-01-23 DIAGNOSIS — Z9989 Dependence on other enabling machines and devices: Secondary | ICD-10-CM | POA: Diagnosis not present

## 2019-01-23 DIAGNOSIS — I6523 Occlusion and stenosis of bilateral carotid arteries: Secondary | ICD-10-CM | POA: Diagnosis not present

## 2019-01-23 DIAGNOSIS — Z0000 Encounter for general adult medical examination without abnormal findings: Secondary | ICD-10-CM | POA: Diagnosis not present

## 2019-01-23 DIAGNOSIS — K2270 Barrett's esophagus without dysplasia: Secondary | ICD-10-CM | POA: Diagnosis not present

## 2019-01-23 DIAGNOSIS — E118 Type 2 diabetes mellitus with unspecified complications: Secondary | ICD-10-CM | POA: Diagnosis not present

## 2019-01-23 DIAGNOSIS — I714 Abdominal aortic aneurysm, without rupture: Secondary | ICD-10-CM | POA: Diagnosis not present

## 2019-03-12 DIAGNOSIS — Z9989 Dependence on other enabling machines and devices: Secondary | ICD-10-CM | POA: Diagnosis not present

## 2019-03-12 DIAGNOSIS — G4733 Obstructive sleep apnea (adult) (pediatric): Secondary | ICD-10-CM | POA: Diagnosis not present

## 2019-03-12 DIAGNOSIS — M545 Low back pain: Secondary | ICD-10-CM | POA: Diagnosis not present

## 2019-03-15 DIAGNOSIS — F5105 Insomnia due to other mental disorder: Secondary | ICD-10-CM | POA: Diagnosis not present

## 2019-03-15 DIAGNOSIS — I10 Essential (primary) hypertension: Secondary | ICD-10-CM | POA: Diagnosis not present

## 2019-03-15 DIAGNOSIS — E7800 Pure hypercholesterolemia, unspecified: Secondary | ICD-10-CM | POA: Diagnosis not present

## 2019-03-15 DIAGNOSIS — F09 Unspecified mental disorder due to known physiological condition: Secondary | ICD-10-CM | POA: Diagnosis not present

## 2019-03-15 DIAGNOSIS — M109 Gout, unspecified: Secondary | ICD-10-CM | POA: Diagnosis not present

## 2019-03-15 DIAGNOSIS — E119 Type 2 diabetes mellitus without complications: Secondary | ICD-10-CM | POA: Diagnosis not present

## 2019-03-15 DIAGNOSIS — Z6827 Body mass index (BMI) 27.0-27.9, adult: Secondary | ICD-10-CM | POA: Diagnosis not present

## 2019-03-15 DIAGNOSIS — R5383 Other fatigue: Secondary | ICD-10-CM | POA: Diagnosis not present

## 2019-03-15 DIAGNOSIS — I2510 Atherosclerotic heart disease of native coronary artery without angina pectoris: Secondary | ICD-10-CM | POA: Diagnosis not present

## 2019-03-15 DIAGNOSIS — C44320 Squamous cell carcinoma of skin of unspecified parts of face: Secondary | ICD-10-CM | POA: Diagnosis not present

## 2019-03-15 DIAGNOSIS — G4733 Obstructive sleep apnea (adult) (pediatric): Secondary | ICD-10-CM | POA: Diagnosis not present

## 2019-03-15 DIAGNOSIS — F3161 Bipolar disorder, current episode mixed, mild: Secondary | ICD-10-CM | POA: Diagnosis not present

## 2019-04-12 ENCOUNTER — Telehealth: Payer: Self-pay | Admitting: Cardiovascular Disease

## 2019-04-12 NOTE — Telephone Encounter (Signed)
° ° °  VIDEO/Doximity visit on 04/15/2019     Consent sent via MyChart     -   04/12/2019

## 2019-04-15 ENCOUNTER — Other Ambulatory Visit: Payer: Self-pay

## 2019-04-15 ENCOUNTER — Telehealth (INDEPENDENT_AMBULATORY_CARE_PROVIDER_SITE_OTHER): Payer: PPO | Admitting: Cardiovascular Disease

## 2019-04-15 ENCOUNTER — Encounter: Payer: Self-pay | Admitting: Cardiovascular Disease

## 2019-04-15 VITALS — BP 126/75 | HR 69 | Ht 69.0 in | Wt 224.6 lb

## 2019-04-15 DIAGNOSIS — I2510 Atherosclerotic heart disease of native coronary artery without angina pectoris: Secondary | ICD-10-CM | POA: Diagnosis not present

## 2019-04-15 DIAGNOSIS — E785 Hyperlipidemia, unspecified: Secondary | ICD-10-CM | POA: Diagnosis not present

## 2019-04-15 DIAGNOSIS — Z952 Presence of prosthetic heart valve: Secondary | ICD-10-CM | POA: Diagnosis not present

## 2019-04-15 DIAGNOSIS — I779 Disorder of arteries and arterioles, unspecified: Secondary | ICD-10-CM

## 2019-04-15 DIAGNOSIS — I739 Peripheral vascular disease, unspecified: Secondary | ICD-10-CM | POA: Diagnosis not present

## 2019-04-15 NOTE — Progress Notes (Signed)
Virtual Visit via Video Note   This visit type was conducted due to national recommendations for restrictions regarding the COVID-19 Pandemic (e.g. social distancing) in an effort to limit this patient's exposure and mitigate transmission in our community.  Due to his co-morbid illnesses, this patient is at least at moderate risk for complications without adequate follow up.  This format is felt to be most appropriate for this patient at this time.  All issues noted in this document were discussed and addressed.  A limited physical exam was performed with this format.  Please refer to the patient's chart for his consent to telehealth for [employer].   Date:  04/15/2019   ID:  Theodore Jenkins, DOB [birth year], MRN [id number]  Patient Location: Home Provider Location: Home  PCP:  Karlene Einstein, MD  Cardiologist:  Lauree Chandler, MD  Electrophysiologist:  None   Evaluation Performed:  Follow-Up Visit  Chief Complaint:  Follow up CAD  History of Present Illness:    Theodore Jenkins is a 73 y.o. male with history of CAD s/p PCI of the LAD in 2005, CABG (LIMA to LAD and SVG to OM) in 2018, severe aortic stenosis s/p bioprosthetic AVR in 2018, CVA s/p right carotid endarterectomy with subsequent right internal carotid artery occlusion and DM who is being seen today by virtual e-visit due to the Covid19 pandemic. He has been followed by Dr. Saunders Revel. I am speaking to him for the first time today. He was last seen by Dr. Saunders Revel in October 2019. He will be changing to my clinic due to Dr. Saunders Revel moving his practice to our [place] office.   He tells me today that he feels great. No chest pain or dyspnea. No LE edema.   The patient does not have symptoms concerning for COVID-19 infection (fever, chills, cough, or new shortness of breath).    Past Medical History:  Diagnosis Date  . Aortic stenosis    Echo (08/2017): Severe AS with mean gradient of 42 mmHg.  . Barrett's esophagus   . CAD (coronary  artery disease) 06/06/2011   Remote stent to the LAD in 2005 // Last cath in 2010 showing diffuse CAD, small PD that is occluded and fills with left to right collaterals, 60-70% 1st OM, with normal EF. Managed medically // Myoview 3/18:  Normal perfusion. LVEF 55% with normal wall motion. This is a low risk study.  . Complication of anesthesia   . Diabetes mellitus   . ED (erectile dysfunction)   . Family history of adverse reaction to anesthesia 10/23/2017   sister had history of malignant hyperthermia when she was 63, she is 71 years old now  . GERD (gastroesophageal reflux disease)   . Hyperlipidemia   . Hypertension   . IHD (ischemic heart disease)    prior stenting of the LAD in December of 2005; with last cath in 2010 showing diffuse CAD with normal LV function. He is managed medically.   . Malignant hyperthermia    sister had reaction concerning for malignant hyperthermia (~ 1988)  . 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 Obesity   . Obstructive sleep apnea on CPAP   . PONV (postoperative nausea and vomiting)   . S/P aortic valve replacement with bioprosthetic valve 10/26/2017   25 mm [facility] Ease bovine pericardial tissue valve  . S/P coronary artery stent placement 2005   LAD  Past Surgical History:  Procedure Laterality Date  . AORTIC VALVE REPLACEMENT N/A 10/26/2017   Procedure: AORTIC VALVE REPLACEMENT (AVR), using Magna Ease 25;  Surgeon: Rexene Alberts, MD;  Location: [place];  Service: Open Heart Surgery;  Laterality: N/A;  . CARDIAC CATHETERIZATION  2010   NORMAL LV FUNCTION. DIFFUSE CORONARY DISEASE WITH THE DISTAL PORTION OF THE SMALL POSTERIOR DESCENDING VESSEL OCCLUDED AND FILLING WITH THE RIGHT COLLATERALS, 60-70% STENOSIS IN THE FIRST OBTUSE MARGINAL VESSEL AND DIFFUSE SMALL VESSEL DISEASE  . CAROTID ENDARTERECTOMY    . CIRCUMCISION  1985  . CORONARY ARTERY BYPASS GRAFT N/A 10/26/2017    Procedure: CORONARY ARTERY BYPASS GRAFTING (CABG) x two , using left internal mammary artery and right leg greater saphenous vein harvested endoscopically;  Surgeon: Rexene Alberts, MD;  Location: [place];  Service: Open Heart Surgery;  Laterality: N/A;  . CORONARY STENT PLACEMENT  2005  . RIGHT/LEFT HEART CATH AND CORONARY ANGIOGRAPHY N/A 08/25/2017   Procedure: RIGHT/LEFT HEART CATH AND CORONARY ANGIOGRAPHY;  Surgeon: Nelva Bush, MD;  Location: [place] CV [facility];  Service: Cardiovascular;  Laterality: N/A;  . rotator cuff surgery Right   . TEE WITHOUT CARDIOVERSION N/A 10/26/2017   Procedure: TRANSESOPHAGEAL ECHOCARDIOGRAM (TEE);  Surgeon: Rexene Alberts, MD;  Location: [place];  Service: Open Heart Surgery;  Laterality: N/A;     Current Meds  Medication Sig  . acetaminophen (TYLENOL) 500 MG tablet Take 1,000 mg by mouth every 8 (eight) hours as needed for headache.  [name] amoxicillin (AMOXIL) 500 MG tablet Take 1 tablet by mouth as needed.  [name] aspirin 81 MG EC tablet Take 1 tablet (81 mg total) by mouth daily.  . cetirizine (ZYRTEC) 10 MG tablet Take 10 mg by mouth daily.   . fluticasone (FLONASE) 50 MCG/ACT nasal spray Place 2 sprays into both nostrils daily as needed for allergies or rhinitis.   [name] gabapentin (NEURONTIN) 300 MG capsule Take 600 mg by mouth daily.  [name] lisinopril (PRINIVIL,ZESTRIL) 5 MG tablet Take 1 tablet (5 mg total) by mouth daily.  . metoprolol succinate (TOPROL-XL) 50 MG 24 hr tablet Take 50 mg by mouth daily. Take with or immediately following a meal.  . Multiple Vitamins-Minerals (ONE-A-DAY 50 PLUS PO) Take 1 tablet by mouth daily.  [name] omeprazole (PRILOSEC OTC) 20 MG tablet Take 20 mg by mouth daily.    Glory Rosebush VERIO test strip TEST BS ONCE D  . rosuvastatin (CRESTOR) 10 MG tablet Take 10 mg by mouth daily.  . sitaGLIPtin-metformin (JANUMET) 50-1000 MG tablet Take 1 tablet by mouth 2 (two) times daily with a meal.      Allergies:   Oxycodone   Social History    Tobacco Use  . Smoking status: Former Smoker    Packs/day: 1.00    Years: 20.00    Pack years: 20.00    Types: Cigarettes    Last attempt to quit: 12/12/1992    Years since quitting: 26.3  . Smokeless tobacco: Never Used  Substance Use Topics  . Alcohol use: Yes    Alcohol/week: 1.0 standard drinks    Types: 1 Cans of beer per week  . Drug use: No     Family Hx: The patient's family history includes CAD in his brother; Cancer in his mother; Heart attack (age of onset: 72) in his father.  ROS:   Please see the history of present illness.    All other systems reviewed and are negative.   Prior CV studies:  The following studies were reviewed today:  Labs/Other Tests and Data Reviewed:    EKG:  No ECG reviewed.  Recent Labs: No results found for requested labs within last 8760 hours.   Recent Lipid Panel Lab Results  Component Value Date/Time   CHOL 105 05/07/2013 08:54 AM   TRIG 110.0 05/07/2013 08:54 AM   HDL 35.80 (L) 05/07/2013 08:54 AM   CHOLHDL 3 05/07/2013 08:54 AM   LDLCALC 47 05/07/2013 08:54 AM    Wt Readings from Last 3 Encounters:  04/15/19 224 lb 9.6 oz (101.9 kg)  12/24/18 229 lb (103.9 kg)  10/22/18 232 lb 9.6 oz (105.5 kg)     Objective:    Vital Signs:  BP 126/75   Pulse 69   Ht 5\' 9"  (1.753 m)   Wt 224 lb 9.6 oz (101.9 kg)   SpO2 94%   BMI 33.17 kg/m    NO exam Webcam issues. Converted to phone visit  ASSESSMENT & PLAN:    1. Aortic stenosis s/p bioprosthetic AVR: Echo in June 2019 with normal function of the bioprosthetic aortic valve. Continue ASA. SBE prophylaxis for dental procedures.   2. CAD s/p CABG without angina: No chest pain. Will continue ASA and statin.   3. Hyperlipidemia: LDL at goal per pt in primary care. Continue statin.   4. Carotid artery disease: Followed in vascular surgery office  COVID-19 Education: The signs and symptoms of COVID-19 were discussed with the patient and how to seek care for testing  (follow up with PCP or arrange E-visit).  The importance of social distancing was discussed today.  Time:   Today, I have spent 15 minutes with the patient with telehealth technology discussing the above problems.     Medication Adjustments/Labs and Tests Ordered: Current medicines are reviewed at length with the patient today.  Concerns regarding medicines are outlined above.   Tests Ordered: No orders of the defined types were placed in this encounter.   Medication Changes: No orders of the defined types were placed in this encounter.   Disposition:  Follow up with me in 6 months  Signed, Lauree Chandler, MD  04/15/2019 4:17 PM    [place]

## 2019-04-15 NOTE — Patient Instructions (Signed)

## 2019-04-19 ENCOUNTER — Other Ambulatory Visit: Payer: Self-pay | Admitting: Cardiovascular Disease

## 2019-04-19 MED ORDER — NITROGLYCERIN 0.4 MG SL SUBL: 0.4000 mg | SUBLINGUAL_TABLET | SUBLINGUAL | 6 refills | Status: DC | PRN

## 2019-05-15 DIAGNOSIS — K2270 Barrett's esophagus without dysplasia: Secondary | ICD-10-CM | POA: Diagnosis not present

## 2019-05-22 DIAGNOSIS — K2270 Barrett's esophagus without dysplasia: Secondary | ICD-10-CM | POA: Diagnosis not present

## 2019-05-22 DIAGNOSIS — R195 Other fecal abnormalities: Secondary | ICD-10-CM | POA: Diagnosis not present

## 2019-06-14 DIAGNOSIS — G4733 Obstructive sleep apnea (adult) (pediatric): Secondary | ICD-10-CM | POA: Diagnosis not present

## 2019-06-25 DIAGNOSIS — Z961 Presence of intraocular lens: Secondary | ICD-10-CM | POA: Diagnosis not present

## 2019-06-25 DIAGNOSIS — H04123 Dry eye syndrome of bilateral lacrimal glands: Secondary | ICD-10-CM | POA: Diagnosis not present

## 2019-06-25 DIAGNOSIS — E113293 Type 2 diabetes mellitus with mild nonproliferative diabetic retinopathy without macular edema, bilateral: Secondary | ICD-10-CM | POA: Diagnosis not present

## 2019-07-22 DIAGNOSIS — E118 Type 2 diabetes mellitus with unspecified complications: Secondary | ICD-10-CM | POA: Diagnosis not present

## 2019-07-22 DIAGNOSIS — E7800 Pure hypercholesterolemia, unspecified: Secondary | ICD-10-CM | POA: Diagnosis not present

## 2019-07-22 DIAGNOSIS — I10 Essential (primary) hypertension: Secondary | ICD-10-CM | POA: Diagnosis not present

## 2019-07-25 DIAGNOSIS — E7800 Pure hypercholesterolemia, unspecified: Secondary | ICD-10-CM | POA: Diagnosis not present

## 2019-07-25 DIAGNOSIS — E118 Type 2 diabetes mellitus with unspecified complications: Secondary | ICD-10-CM | POA: Diagnosis not present

## 2019-07-25 DIAGNOSIS — I10 Essential (primary) hypertension: Secondary | ICD-10-CM | POA: Diagnosis not present

## 2019-07-25 DIAGNOSIS — G4733 Obstructive sleep apnea (adult) (pediatric): Secondary | ICD-10-CM | POA: Diagnosis not present

## 2019-07-25 DIAGNOSIS — I2510 Atherosclerotic heart disease of native coronary artery without angina pectoris: Secondary | ICD-10-CM | POA: Diagnosis not present

## 2019-07-25 DIAGNOSIS — Z9989 Dependence on other enabling machines and devices: Secondary | ICD-10-CM | POA: Diagnosis not present

## 2019-07-25 DIAGNOSIS — K219 Gastro-esophageal reflux disease without esophagitis: Secondary | ICD-10-CM | POA: Diagnosis not present

## 2019-09-10 ENCOUNTER — Other Ambulatory Visit: Payer: Self-pay | Admitting: Cardiovascular Disease

## 2019-09-10 MED ORDER — LISINOPRIL 5 MG PO TABS: 5.0000 mg | ORAL_TABLET | Freq: Every day | ORAL | 2 refills | Status: DC

## 2019-09-16 DIAGNOSIS — G4733 Obstructive sleep apnea (adult) (pediatric): Secondary | ICD-10-CM | POA: Diagnosis not present

## 2019-09-27 ENCOUNTER — Encounter: Payer: Self-pay | Admitting: Cardiovascular Disease

## 2019-09-29 NOTE — Progress Notes (Signed)
Chief Complaint  Patient presents with  . Follow-up    CAD    History of Present Illness:  73 y.o. male with history of CAD s/p PCI of the LAD in 2005, CABG (LIMA to LAD and SVG to OM) in 2018, severe aortic stenosis s/p bioprosthetic AVR in 2018, CVA s/p right carotid endarterectomy with subsequent right internal carotid artery occlusion, Barrett's esophagitis, AAA, HTN, HLD and DM who is here today for cardiac follow up. He underwent 2V CABG and AVR in 2018. Echo June 2019 with LVEF=55-60% with normally functioning bioprosthetic AVR.   He is here today for follow up. The patient denies any chest pain, dyspnea, palpitations, lower extremity edema, orthopnea, PND, dizziness, near syncope or syncope.   Primary Care Physician: Karlene Einstein, MD   Past Medical History:  Diagnosis Date  . Aortic stenosis    Echo (08/2017): Severe AS with mean gradient of 42 mmHg.  . Barrett's esophagus   . CAD (coronary artery disease) 06/06/2011   Remote stent to the LAD in 2005 // Last cath in 2010 showing diffuse CAD, small PD that is occluded and fills with left to right collaterals, 60-70% 1st OM, with normal EF. Managed medically // Myoview 3/18:  Normal perfusion. LVEF 55% with normal wall motion. This is a low risk study.  . Complication of anesthesia   . Diabetes mellitus   . ED (erectile dysfunction)   . Family history of adverse reaction to anesthesia 10/23/2017   sister had history of malignant hyperthermia when she was 71, she is 71 years old now  . GERD (gastroesophageal reflux disease)   . Hyperlipidemia   . Hypertension   . IHD (ischemic heart disease)    prior stenting of the LAD in December of 2005; with last cath in 2010 showing diffuse CAD with normal LV function. He is managed medically.   . Malignant hyperthermia    sister had reaction concerning for malignant hyperthermia (~ 1988)  . 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 Obesity   . Obstructive sleep apnea on CPAP   . PONV (postoperative nausea and vomiting)   . S/P aortic valve replacement with bioprosthetic valve 10/26/2017   25 mm [facility] Ease bovine pericardial tissue valve  . S/P coronary artery stent placement 2005   LAD    Past Surgical History:  Procedure Laterality Date  . AORTIC VALVE REPLACEMENT N/A 10/26/2017   Procedure: AORTIC VALVE REPLACEMENT (AVR), using Magna Ease 25;  Surgeon: Rexene Alberts, MD;  Location: [place];  Service: Open Heart Surgery;  Laterality: N/A;  . CARDIAC CATHETERIZATION  2010   NORMAL LV FUNCTION. DIFFUSE CORONARY DISEASE WITH THE DISTAL PORTION OF THE SMALL POSTERIOR DESCENDING VESSEL OCCLUDED AND FILLING WITH THE RIGHT COLLATERALS, 60-70% STENOSIS IN THE FIRST OBTUSE MARGINAL VESSEL AND DIFFUSE SMALL VESSEL DISEASE  . CAROTID ENDARTERECTOMY    . CIRCUMCISION  1985  . CORONARY ARTERY BYPASS GRAFT N/A 10/26/2017   Procedure: CORONARY ARTERY BYPASS GRAFTING (CABG) x two , using left internal mammary artery and right leg greater saphenous vein harvested endoscopically;  Surgeon: Rexene Alberts, MD;  Location: [place];  Service: Open Heart Surgery;  Laterality: N/A;  . CORONARY STENT PLACEMENT  2005  . RIGHT/LEFT HEART CATH AND CORONARY ANGIOGRAPHY N/A 08/25/2017   Procedure: RIGHT/LEFT HEART CATH AND CORONARY ANGIOGRAPHY;  Surgeon: Nelva Bush, MD;  Location: [place] CV LAB;  Service:  Cardiovascular;  Laterality: N/A;  . rotator cuff surgery Right   . TEE WITHOUT CARDIOVERSION N/A 10/26/2017   Procedure: TRANSESOPHAGEAL ECHOCARDIOGRAM (TEE);  Surgeon: Rexene Alberts, MD;  Location: [place];  Service: Open Heart Surgery;  Laterality: N/A;    Current Outpatient Medications  Medication Sig Dispense Refill  . acetaminophen (TYLENOL) 500 MG tablet Take 1,000 mg by mouth every 8 (eight) hours as needed for headache.    [name] amoxicillin (AMOXIL) 500 MG tablet Take 1 tablet by mouth as needed.  0   . aspirin 81 MG EC tablet Take 1 tablet (81 mg total) by mouth daily.    . cetirizine (ZYRTEC) 10 MG tablet Take 10 mg by mouth daily.     . fluticasone (FLONASE) 50 MCG/ACT nasal spray Place 2 sprays into both nostrils daily as needed for allergies or rhinitis.     [name] gabapentin (NEURONTIN) 300 MG capsule Take 600 mg by mouth daily.    [name] lisinopril (ZESTRIL) 5 MG tablet Take 1 tablet (5 mg total) by mouth daily. 90 tablet 2  . metoprolol succinate (TOPROL-XL) 50 MG 24 hr tablet Take 50 mg by mouth daily. Take with or immediately following a meal.    . Multiple Vitamins-Minerals (ONE-A-DAY 50 PLUS PO) Take 1 tablet by mouth daily.    [name] omeprazole (PRILOSEC OTC) 20 MG tablet Take 20 mg by mouth daily.      Glory Rosebush VERIO test strip TEST BS ONCE D  3  . rosuvastatin (CRESTOR) 10 MG tablet Take 10 mg by mouth daily.    . sitaGLIPtin-metformin (JANUMET) 50-1000 MG tablet Take 1 tablet by mouth 2 (two) times daily with a meal.     . nitroGLYCERIN (NITROSTAT) 0.4 MG SL tablet Place 1 tablet (0.4 mg total) under the tongue every 5 (five) minutes as needed for chest pain. 25 tablet 6   No current facility-administered medications for this visit.     Allergies  Allergen Reactions  . Oxycodone Nausea And Vomiting    Social History   Socioeconomic History  . Marital status: Married    Spouse name: Not on file  . Number of children: 3  . Years of education: Not on file  . Highest education level: Not on file  Occupational History  . Occupation: Retired-[employer]  . Financial resource strain: Not on file  . Food insecurity    Worry: Not on file    Inability: Not on file  . Transportation needs    Medical: Not on file    Non-medical: Not on file  Tobacco Use  . Smoking status: Former Smoker    Packs/day: 1.00    Years: 20.00    Pack years: 20.00    Types: Cigarettes    Quit date: 12/12/1992    Years since quitting: 26.8  . Smokeless tobacco: Never Used   Substance and Sexual Activity  . Alcohol use: Yes    Alcohol/week: 1.0 standard drinks    Types: 1 Cans of beer per week  . Drug use: No  . Sexual activity: Not Currently  Lifestyle  . Physical activity    Days per week: Not on file    Minutes per session: Not on file  . Stress: Not on file  Relationships  . Social Herbalist on phone: Not on file    Gets together: Not on file    Attends religious service: Not on file    Active member  of club or organization: Not on file    Attends meetings of clubs or organizations: Not on file    Relationship status: Not on file  . Intimate partner violence    Fear of current or ex partner: Not on file    Emotionally abused: Not on file    Physically abused: Not on file    Forced sexual activity: Not on file  Other Topics Concern  . Not on file  Social History Narrative  . Not on file    Family History  Problem Relation Age of Onset  . Cancer Mother   . Heart attack Father 81  . CAD Brother     Review of Systems:  As stated in the HPI and otherwise negative.   BP 118/70   Pulse (!) 57   Ht 5\' 9"  (1.753 m)   Wt 198 lb 9.6 oz (90.1 kg)   SpO2 99%   BMI 29.33 kg/m   Physical Examination: General: Well developed, well nourished, NAD  HEENT: OP clear, mucus membranes moist  SKIN: warm, dry. No rashes. Neuro: No focal deficits  Musculoskeletal: Muscle strength 5/5 all ext  Psychiatric: Mood and affect normal  Neck: No JVD, no carotid bruits, no thyromegaly, no lymphadenopathy.  Lungs:Clear bilaterally, no wheezes, rhonci, crackles Cardiovascular: Regular rate and rhythm. No murmurs, gallops or rubs. Abdomen:Soft. Bowel sounds present. Non-tender.  Extremities: No lower extremity edema. Pulses are 2 + in the bilateral DP/PT.  Echo 05/30/18: - Left ventricle: The cavity size was normal. Wall thickness was   increased in a pattern of moderate LVH. Systolic function was   normal. The estimated ejection fraction was in  the range of 55%   to 60%. Wall motion was normal; there were no regional wall   motion abnormalities. There was no evidence of elevated   ventricular filling pressure by Doppler parameters. - Ventricular septum: Septal motion showed &quot;bounce&quot;. - Aortic valve: Edwards Magna Ease 25 mm pericardial tissue valve.   Functioning well. There was no regurgitation. Mean gradient (S):   7 mm Hg. - Mitral valve: There was trivial regurgitation. - Left atrium: The atrium was mildly dilated. - Right ventricle: The cavity size was mildly dilated. Wall   thickness was normal. - Right atrium: The atrium was mildly dilated.  Cardiac cath 08/25/17: Findings: 1. Multivessel coronary artery disease including diffuse mild to moderate proximal to mid LAD disease, 50-60% in-stent restenosis of the distal LAD stent, moderate-caliber OM2 branch with 80% proximal disease, and dominant RCA with chronic total occlusion of very small posterior descending artery. Disease predominantly affects small/distal vessels. 2. Severe aortic stenosis with mean transvalvular gradient by pullback of 66 mmHg and peak-to-peak gradient of 71 mmHg. 3. Normal left and right heart filling pressures. 4. Normal Fick cardiac output.  EKG:  EKG is ordered today. The ekg ordered today demonstrates Sinus brady, rate 57 bpm. 1st degree AV block. Incomplete LBBB  Recent Labs: No results found for requested labs within last 8760 hours.   Lipid Panel    Component Value Date/Time   CHOL 105 05/07/2013 0854   TRIG 110.0 05/07/2013 0854   HDL 35.80 (L) 05/07/2013 0854   CHOLHDL 3 05/07/2013 0854   VLDL 22.0 05/07/2013 0854   LDLCALC 47 05/07/2013 0854     Wt Readings from Last 3 Encounters:  09/30/19 198 lb 9.6 oz (90.1 kg)  04/15/19 224 lb 9.6 oz (101.9 kg)  12/24/18 229 lb (103.9 kg)     Assessment and Plan:  1. Aortic stenosis s/p bioprosthetic AVR: Echo in June 2019 with normal function of the bioprosthetic aortic  valve. Will continue ASA. He will use SBE prophylaxis as necessary.   2. CAD s/p CABG without angina: He has no chest pain. Continue ASA and statin.    3. Hyperlipidemia: Lipids followed in primary care and well controlled with LDL of 43 in 2020. Will continue statin.    4. Carotid artery disease: Followed in vascular surgery office  Labs/ tests ordered today include:   Orders Placed This Encounter  Procedures  . EKG 12-Lead   Disposition:   Follow up with me in one year.   Signed, Lauree Chandler, MD 09/30/2019 9:24 AM    [facility] [address], [place], [place]  [address] Phone: [phone]; Fax: [phone]

## 2019-09-30 ENCOUNTER — Encounter: Payer: Self-pay | Admitting: Cardiovascular Disease

## 2019-09-30 ENCOUNTER — Ambulatory Visit (INDEPENDENT_AMBULATORY_CARE_PROVIDER_SITE_OTHER): Payer: PPO | Admitting: Cardiovascular Disease

## 2019-09-30 ENCOUNTER — Other Ambulatory Visit: Payer: Self-pay

## 2019-09-30 VITALS — BP 118/70 | HR 57 | Ht 69.0 in | Wt 198.6 lb

## 2019-09-30 DIAGNOSIS — E785 Hyperlipidemia, unspecified: Secondary | ICD-10-CM

## 2019-09-30 DIAGNOSIS — I2510 Atherosclerotic heart disease of native coronary artery without angina pectoris: Secondary | ICD-10-CM | POA: Diagnosis not present

## 2019-09-30 DIAGNOSIS — I350 Nonrheumatic aortic (valve) stenosis: Secondary | ICD-10-CM

## 2019-09-30 NOTE — Patient Instructions (Signed)

## 2019-10-01 ENCOUNTER — Telehealth: Payer: Self-pay

## 2019-10-01 NOTE — Telephone Encounter (Signed)
Wife called and said that pt was having increased dizziness and weakness. She said that he seems to shake on his left side as well when he stands up. She said that he was at the cardiologist yesterday and they did not think that it had anything to do with the procedure that they had recently done.   Read the note from the cardiology visit and it said that pt denied dizziness and weakness. I asked if he mentioned it to them at that time because these could also be stroke like symptoms as well. She said that he did and they evaluated him. She said that they told him that they did not think it was anything of concern. No drooping of the face, slurred speech or noticeable weakness with grip. Appt was made for pt to come in and be re-evaluated for Carotid issues but advised if symptoms changed to go to ER for evaluation.   York Cerise, CMA

## 2019-10-02 ENCOUNTER — Other Ambulatory Visit: Payer: Self-pay

## 2019-10-02 DIAGNOSIS — I6523 Occlusion and stenosis of bilateral carotid arteries: Secondary | ICD-10-CM

## 2019-10-03 ENCOUNTER — Telehealth (HOSPITAL_COMMUNITY): Payer: Self-pay

## 2019-10-03 NOTE — Telephone Encounter (Signed)

## 2019-10-04 ENCOUNTER — Ambulatory Visit (INDEPENDENT_AMBULATORY_CARE_PROVIDER_SITE_OTHER): Payer: PPO | Admitting: Family

## 2019-10-04 ENCOUNTER — Encounter: Payer: Self-pay | Admitting: Family

## 2019-10-04 ENCOUNTER — Other Ambulatory Visit: Payer: Self-pay

## 2019-10-04 ENCOUNTER — Ambulatory Visit (HOSPITAL_COMMUNITY)
Admission: RE | Admit: 2019-10-04 | Discharge: 2019-10-04 | Disposition: A | Discharge status: Home / Self Care | Payer: PPO | Source: Ambulatory Visit | Attending: Family | Admitting: Family

## 2019-10-04 VITALS — BP 110/69 | HR 56 | Temp 97.4°F | Resp 20 | Ht 69.0 in | Wt 200.2 lb

## 2019-10-04 DIAGNOSIS — I6521 Occlusion and stenosis of right carotid artery: Secondary | ICD-10-CM

## 2019-10-04 DIAGNOSIS — I6522 Occlusion and stenosis of left carotid artery: Secondary | ICD-10-CM | POA: Diagnosis not present

## 2019-10-04 DIAGNOSIS — I6523 Occlusion and stenosis of bilateral carotid arteries: Secondary | ICD-10-CM | POA: Insufficient documentation

## 2019-10-04 DIAGNOSIS — Z9889 Other specified postprocedural states: Secondary | ICD-10-CM | POA: Diagnosis not present

## 2019-10-04 NOTE — Progress Notes (Signed)
Chief Complaint: Follow up Extracranial Carotid Artery Stenosis   History of Present Illness  Theodore Jenkins is a 73 y.o. male who is s/p right carotid endarterectomy by Dr. Amedeo Plenty in 2003.  This was a very difficult operation.  He had a vein patch performed; the plaque went way up towards the skull base.  By report, this occluded in the early postoperative period.  Fortunately, the patient has remained asymptomatic.  Pt is also status post aortic valve replacement and CABG x2 on October 26, 2017.  Dr. Trula Slade last evaluated pt on 12-24-18. At that time, 2 prior ultrasounds and reporting from the patient suggest that the right carotid is confirmed to be occluded.  He was not interested in further imaging such as a CT scan to confirm this.  There was minimal disease in the left side. Dr. Trula Slade told him that we would continue with annual surveillance.  Should this progress, Dr. Trula Slade would consider stenting given the high bifurcation as well as the difficulty encountered in treating the other side. Lower extremity vascular disease: Patient had normal ABIs but slightly lower toe pressures suggesting diabetic involvement of the blood vessels out on his foot.  Daily foot care was recommended. Abdominal aorta: The patient has been followed in [facility] for an abdominal aneurysm.  When Dr. Trula Slade viewed his CT scan from 2018, he hardly had ectasia in his aorta; plan on getting a repeat ultrasound in 3 years (early 2023) to make sure there has been no aortic change in diameter.  He has has been having episodes of weakness in his left upper and lower extremities since 2018, and seems to be happening more frequently. He checks his blood sugar and blood pressure at those times, and both are OK.  This only happens when he is sitting.   He exercises daily for 45 minutes on an elliptical and treadmill.   He has a history of Barrett's esophagus as well as obstructive sleep apnea.    He denies any  claudication type symptoms in is legs with walking  Diabetic: yes, most recent A1C result on file was 7.4 on 10-23-17, states his most recent was 6.3 Tobacco use: former smoker, quit in 1994, smoked 1 ppd x 20 years  Pt meds include: Statin : yes ASA: yes Other anticoagulants/antiplatelets: no   Past Medical History:  Diagnosis Date  . Aortic stenosis    Echo (08/2017): Severe AS with mean gradient of 42 mmHg.  . Barrett's esophagus   . CAD (coronary artery disease) 06/06/2011   Remote stent to the LAD in 2005 // Last cath in 2010 showing diffuse CAD, small PD that is occluded and fills with left to right collaterals, 60-70% 1st OM, with normal EF. Managed medically // Myoview 3/18:  Normal perfusion. LVEF 55% with normal wall motion. This is a low risk study.  . Complication of anesthesia   . Diabetes mellitus   . ED (erectile dysfunction)   . Family history of adverse reaction to anesthesia 10/23/2017   sister had history of malignant hyperthermia when she was 13, she is 71 years old now  . GERD (gastroesophageal reflux disease)   . Hyperlipidemia   . Hypertension   . IHD (ischemic heart disease)    prior stenting of the LAD in December of 2005; with last cath in 2010 showing diffuse CAD with normal LV function. He is managed medically.   . Malignant hyperthermia    sister had reaction concerning for malignant hyperthermia (~  1988)  . Neuromuscular disorder ([place]) 10/23/2017   lumbar 4 disc in back causing nerve pain, on gabapentin  . Obesity   . Obstructive sleep apnea on CPAP   . PONV (postoperative nausea and vomiting)   . S/P aortic valve replacement with bioprosthetic valve 10/26/2017   25 mm [facility] Ease bovine pericardial tissue valve  . S/P coronary artery stent placement 2005   LAD    Social History Social History   Tobacco Use  . Smoking status: Former Smoker    Packs/day: 1.00    Years: 20.00    Pack years: 20.00    Types: Cigarettes    Quit date:  12/12/1992    Years since quitting: 26.8  . Smokeless tobacco: Never Used  Substance Use Topics  . Alcohol use: Yes    Alcohol/week: 1.0 standard drinks    Types: 1 Cans of beer per week  . Drug use: No    Family History Family History  Problem Relation Age of Onset  . Cancer Mother   . Heart attack Father 26  . CAD Brother     Surgical History Past Surgical History:  Procedure Laterality Date  . AORTIC VALVE REPLACEMENT N/A 10/26/2017   Procedure: AORTIC VALVE REPLACEMENT (AVR), using Magna Ease 25;  Surgeon: Rexene Alberts, MD;  Location: [place];  Service: Open Heart Surgery;  Laterality: N/A;  . CARDIAC CATHETERIZATION  2010   NORMAL LV FUNCTION. DIFFUSE CORONARY DISEASE WITH THE DISTAL PORTION OF THE SMALL POSTERIOR DESCENDING VESSEL OCCLUDED AND FILLING WITH THE RIGHT COLLATERALS, 60-70% STENOSIS IN THE FIRST OBTUSE MARGINAL VESSEL AND DIFFUSE SMALL VESSEL DISEASE  . CAROTID ENDARTERECTOMY    . CIRCUMCISION  1985  . CORONARY ARTERY BYPASS GRAFT N/A 10/26/2017   Procedure: CORONARY ARTERY BYPASS GRAFTING (CABG) x two , using left internal mammary artery and right leg greater saphenous vein harvested endoscopically;  Surgeon: Rexene Alberts, MD;  Location: [place];  Service: Open Heart Surgery;  Laterality: N/A;  . CORONARY STENT PLACEMENT  2005  . RIGHT/LEFT HEART CATH AND CORONARY ANGIOGRAPHY N/A 08/25/2017   Procedure: RIGHT/LEFT HEART CATH AND CORONARY ANGIOGRAPHY;  Surgeon: Nelva Bush, MD;  Location: [place] [facility];  Service: Cardiovascular;  Laterality: N/A;  . rotator cuff surgery Right   . TEE WITHOUT CARDIOVERSION N/A 10/26/2017   Procedure: TRANSESOPHAGEAL ECHOCARDIOGRAM (TEE);  Surgeon: Rexene Alberts, MD;  Location: [place];  Service: Open Heart Surgery;  Laterality: N/A;    Allergies  Allergen Reactions  . Oxycodone Nausea And Vomiting    Current Outpatient Medications  Medication Sig Dispense Refill  . acetaminophen (TYLENOL) 500 MG tablet Take  1,000 mg by mouth every 8 (eight) hours as needed for headache.    [name] amoxicillin (AMOXIL) 500 MG tablet Take 1 tablet by mouth as needed.  0  . aspirin 81 MG EC tablet Take 1 tablet (81 mg total) by mouth daily.    . cetirizine (ZYRTEC) 10 MG tablet Take 10 mg by mouth daily.     . fluticasone (FLONASE) 50 MCG/ACT nasal spray Place 2 sprays into both nostrils daily as needed for allergies or rhinitis.     [name] gabapentin (NEURONTIN) 300 MG capsule Take 600 mg by mouth daily.    [name] lisinopril (ZESTRIL) 5 MG tablet Take 1 tablet (5 mg total) by mouth daily. 90 tablet 2  . metoprolol succinate (TOPROL-XL) 50 MG 24 hr tablet Take 50 mg by mouth daily. Take with or immediately following a  meal.    . Multiple Vitamins-Minerals (ONE-A-DAY 50 PLUS PO) Take 1 tablet by mouth daily.    [name] omeprazole (PRILOSEC OTC) 20 MG tablet Take 20 mg by mouth daily.      Glory Rosebush VERIO test strip TEST BS ONCE D  3  . rosuvastatin (CRESTOR) 10 MG tablet Take 10 mg by mouth daily.    . sitaGLIPtin-metformin (JANUMET) 50-1000 MG tablet Take 1 tablet by mouth 2 (two) times daily with a meal.     . nitroGLYCERIN (NITROSTAT) 0.4 MG SL tablet Place 1 tablet (0.4 mg total) under the tongue every 5 (five) minutes as needed for chest pain. 25 tablet 6   No current facility-administered medications for this visit.     Review of Systems : See HPI for pertinent positives and negatives.  Physical Examination  Vitals:   10/04/19 0858 10/04/19 0900  BP: 104/62 110/69  Pulse: (!) 56   Resp: 20   Temp: (!) 97.4 F (36.3 C)   SpO2: 97%   Weight: 200 lb 3.2 oz (90.8 kg)   Height: 5\' 9"  (1.753 m)    Body mass index is 29.56 kg/m.  General: WDWN male in NAD accompanied by wife GAIT: normal Eyes: Pupils are equal and round HENT: No gross abnormalities.  Pulmonary:  Respirations are non-labored, good air movement in all fields, CTAB, no rales, rhonchi, or  wheezes. Cardiac: regular rhythm, no detected murmur.  VASCULAR  EXAM Carotid Bruits Right Left   Negative Negative     Abdominal aortic pulse is not palpable. Radial pulses: right is 2+, left is 1+palpable                                                                                                                            LE Pulses Right Left       POPLITEAL  1+ palpable   1+ palpable       POSTERIOR TIBIAL  1+ palpable   1+ palpable        DORSALIS PEDIS      ANTERIOR TIBIAL not palpable  not palpable     Gastrointestinal: soft, nontender, BS WNL, no r/g, no palpable masses. Musculoskeletal: no muscle atrophy/wasting. M/S 5/5 throughout, extremities without ischemic changes Skin: No rashes, no ulcers, no cellulitis.   Neurologic:  A&O X 3; appropriate affect, sensation is normal; speech is normal, CN 2-12 intact, pain and light touch intact in extremities, motor exam as listed above. Psychiatric: Normal thought content, mood appropriate to clinical situation.    DATA Carotid Duplex (10-04-19): Right Carotid: Non-hemodynamically significant plaque <50% noted in the CCA. The                ECA appears >50% stenosed.                ICA indentified; unalbe to detect flow; cannot rule out                occlusion.  Techinically difficult due to limitations as listed above. Left Carotid: Velocities in the left ICA are consistent with a 40-59% stenosis.               Non-hemodynamically significant plaque <50% noted in the CCA. The               ECA appears <50% stenosed. Vertebrals:  Bilateral vertebral arteries demonstrate antegrade flow. Subclavians: Right subclavian artery flow was disturbed. Normal flow              hemodynamics were seen in the left subclavian artery. Increased stenosis in the left ICA compared to the exam on 12-24-18, right ICA remains occluded.    Assessment: Theodore Jenkins is a 73 y.o. male who is s/p right carotid endarterectomy by Dr. Amedeo Plenty in 2003.  This was a very difficult operation.  He had a vein patch  performed; the plaque went way up towards the skull base.  By report, this occluded in the early postoperative period.    Today's carotid duplex shows 40-59% left ICA stenosis. Increased stenosis in the left ICA compared to the exam on 12-24-18, right ICA remains occluded.   Referral to a neurologist to evaluate episodes of left side weakness that are increasing in frequency. Right extracranial carotid artery remains occluded, left extracranial carotid artery shows 40-59% stenosis on duplex  He recently saw his cardiologist. The weakness episodes only happen when he is sitting, and he states that his blood sugars and blood pressure at those times have been ok.    Plan: Follow-up in 6 months with Carotid Duplex scan, see Dr. Trula Slade.    The patient has been followed in [facility] for an abdominal aneurysm.  When Dr. Trula Slade viewed his CT scan from 2018, he hardly had ectasia in his aorta; plan on getting a repeat ultrasound in 3 years (early 2023) to make sure there has been no aortic change in diameter.   I discussed in depth with the patient the nature of atherosclerosis, and emphasized the importance of maximal medical management including strict control of blood pressure, blood glucose, and lipid levels, obtaining regular exercise, and continued cessation of smoking.  The patient is aware that without maximal medical management the underlying atherosclerotic disease process will progress, limiting the benefit of any interventions. The patient was given information about stroke prevention and what symptoms should prompt the patient to seek immediate medical care. Thank you for allowing [place] to participate in this patient's care.  Clemon Chambers, RN, MSN, FNP-C [facility] Office: [phone]  Clinic Physician: Carlis Abbott on call  10/04/19 9:29 AM

## 2019-10-04 NOTE — Patient Instructions (Signed)

## 2019-12-04 ENCOUNTER — Ambulatory Visit: Payer: PPO | Admitting: Diagnostic Neuroimaging

## 2019-12-17 DIAGNOSIS — G4733 Obstructive sleep apnea (adult) (pediatric): Secondary | ICD-10-CM | POA: Diagnosis not present

## 2019-12-23 ENCOUNTER — Ambulatory Visit: Payer: PPO | Admitting: Cardiovascular Disease

## 2020-01-07 ENCOUNTER — Ambulatory Visit: Payer: PPO

## 2020-01-16 ENCOUNTER — Ambulatory Visit: Payer: PPO | Attending: Internal Medicine

## 2020-01-16 DIAGNOSIS — Z23 Encounter for immunization: Secondary | ICD-10-CM

## 2020-01-16 NOTE — Progress Notes (Signed)
Patient stayed his 15 minutes and no reaction.

## 2020-01-24 ENCOUNTER — Ambulatory Visit: Payer: PPO

## 2020-01-27 ENCOUNTER — Ambulatory Visit: Payer: Self-pay

## 2020-01-31 DIAGNOSIS — E118 Type 2 diabetes mellitus with unspecified complications: Secondary | ICD-10-CM | POA: Diagnosis not present

## 2020-01-31 DIAGNOSIS — I10 Essential (primary) hypertension: Secondary | ICD-10-CM | POA: Diagnosis not present

## 2020-01-31 DIAGNOSIS — E7800 Pure hypercholesterolemia, unspecified: Secondary | ICD-10-CM | POA: Diagnosis not present

## 2020-02-04 DIAGNOSIS — K219 Gastro-esophageal reflux disease without esophagitis: Secondary | ICD-10-CM | POA: Diagnosis not present

## 2020-02-04 DIAGNOSIS — I714 Abdominal aortic aneurysm, without rupture: Secondary | ICD-10-CM | POA: Diagnosis not present

## 2020-02-04 DIAGNOSIS — E118 Type 2 diabetes mellitus with unspecified complications: Secondary | ICD-10-CM | POA: Diagnosis not present

## 2020-02-04 DIAGNOSIS — I2510 Atherosclerotic heart disease of native coronary artery without angina pectoris: Secondary | ICD-10-CM | POA: Diagnosis not present

## 2020-02-04 DIAGNOSIS — Z0000 Encounter for general adult medical examination without abnormal findings: Secondary | ICD-10-CM | POA: Diagnosis not present

## 2020-02-04 DIAGNOSIS — I70203 Unspecified atherosclerosis of native arteries of extremities, bilateral legs: Secondary | ICD-10-CM | POA: Diagnosis not present

## 2020-02-04 DIAGNOSIS — E7800 Pure hypercholesterolemia, unspecified: Secondary | ICD-10-CM | POA: Diagnosis not present

## 2020-02-04 DIAGNOSIS — N529 Male erectile dysfunction, unspecified: Secondary | ICD-10-CM | POA: Diagnosis not present

## 2020-02-04 DIAGNOSIS — E669 Obesity, unspecified: Secondary | ICD-10-CM | POA: Diagnosis not present

## 2020-02-04 DIAGNOSIS — Z9989 Dependence on other enabling machines and devices: Secondary | ICD-10-CM | POA: Diagnosis not present

## 2020-02-04 DIAGNOSIS — K2270 Barrett's esophagus without dysplasia: Secondary | ICD-10-CM | POA: Diagnosis not present

## 2020-02-04 DIAGNOSIS — G4733 Obstructive sleep apnea (adult) (pediatric): Secondary | ICD-10-CM | POA: Diagnosis not present

## 2020-02-04 DIAGNOSIS — I6523 Occlusion and stenosis of bilateral carotid arteries: Secondary | ICD-10-CM | POA: Diagnosis not present

## 2020-02-10 ENCOUNTER — Ambulatory Visit: Payer: PPO | Attending: Internal Medicine

## 2020-02-10 DIAGNOSIS — Z23 Encounter for immunization: Secondary | ICD-10-CM

## 2020-02-10 NOTE — Progress Notes (Signed)
   Covid-19 Vaccination Clinic  Name:  Theodore Jenkins    MRN: [id number] DOB: 1946-03-22  02/10/2020  Theodore Jenkins was observed post Covid-19 immunization for 15 minutes without incidence. He was provided with Vaccine Information Sheet and instruction to access the V-Safe system.   Theodore Jenkins was instructed to call 911 with any severe reactions post vaccine: [name] Difficulty breathing  . Swelling of your face and throat  . A fast heartbeat  . A bad rash all over your body  . Dizziness and weakness    Immunizations Administered    Name Date Dose VIS Date Route   Pfizer COVID-19 Vaccine 02/10/2020 12:57 PM 0.3 mL 11/22/2019 Intramuscular   Manufacturer: [place]   Lot: [id number]   [place]: [id number]

## 2020-02-20 DIAGNOSIS — M545 Low back pain: Secondary | ICD-10-CM | POA: Diagnosis not present

## 2020-02-20 DIAGNOSIS — Z9989 Dependence on other enabling machines and devices: Secondary | ICD-10-CM | POA: Diagnosis not present

## 2020-02-20 DIAGNOSIS — G4733 Obstructive sleep apnea (adult) (pediatric): Secondary | ICD-10-CM | POA: Diagnosis not present

## 2020-03-16 DIAGNOSIS — G4733 Obstructive sleep apnea (adult) (pediatric): Secondary | ICD-10-CM | POA: Diagnosis not present

## 2020-03-17 DIAGNOSIS — G4733 Obstructive sleep apnea (adult) (pediatric): Secondary | ICD-10-CM | POA: Diagnosis not present

## 2020-04-14 DIAGNOSIS — R509 Fever, unspecified: Secondary | ICD-10-CM | POA: Diagnosis not present

## 2020-04-14 DIAGNOSIS — R519 Headache, unspecified: Secondary | ICD-10-CM | POA: Diagnosis not present

## 2020-04-14 DIAGNOSIS — M791 Myalgia, unspecified site: Secondary | ICD-10-CM | POA: Diagnosis not present

## 2020-04-14 DIAGNOSIS — Z20822 Contact with and (suspected) exposure to covid-19: Secondary | ICD-10-CM | POA: Diagnosis not present

## 2020-04-14 DIAGNOSIS — J069 Acute upper respiratory infection, unspecified: Secondary | ICD-10-CM | POA: Diagnosis not present

## 2020-04-28 DIAGNOSIS — R0602 Shortness of breath: Secondary | ICD-10-CM | POA: Diagnosis not present

## 2020-04-28 DIAGNOSIS — R350 Frequency of micturition: Secondary | ICD-10-CM | POA: Diagnosis not present

## 2020-04-28 DIAGNOSIS — R509 Fever, unspecified: Secondary | ICD-10-CM | POA: Diagnosis not present

## 2020-04-28 DIAGNOSIS — R531 Weakness: Secondary | ICD-10-CM | POA: Diagnosis not present

## 2020-04-28 DIAGNOSIS — Z952 Presence of prosthetic heart valve: Secondary | ICD-10-CM | POA: Diagnosis not present

## 2020-04-28 DIAGNOSIS — Z20822 Contact with and (suspected) exposure to covid-19: Secondary | ICD-10-CM | POA: Diagnosis not present

## 2020-04-29 ENCOUNTER — Encounter (HOSPITAL_COMMUNITY): Payer: Self-pay | Admitting: Emergency Medicine

## 2020-04-29 ENCOUNTER — Emergency Department (HOSPITAL_COMMUNITY): Payer: PPO

## 2020-04-29 ENCOUNTER — Inpatient Hospital Stay (HOSPITAL_COMMUNITY)
Admission: EM | Admit: 2020-04-29 | Discharge: 2020-05-04 | DRG: 314 | Disposition: A | Discharge status: Home Health | Payer: PPO | Attending: Internal Medicine | Admitting: Internal Medicine

## 2020-04-29 DIAGNOSIS — Y831 Surgical operation with implant of artificial internal device as the cause of abnormal reaction of the patient, or of later complication, without mention of misadventure at the time of the procedure: Secondary | ICD-10-CM | POA: Diagnosis present

## 2020-04-29 DIAGNOSIS — Z87891 Personal history of nicotine dependence: Secondary | ICD-10-CM

## 2020-04-29 DIAGNOSIS — T826XXA Infection and inflammatory reaction due to cardiac valve prosthesis, initial encounter: Principal | ICD-10-CM | POA: Diagnosis present

## 2020-04-29 DIAGNOSIS — I38 Endocarditis, valve unspecified: Secondary | ICD-10-CM | POA: Diagnosis not present

## 2020-04-29 DIAGNOSIS — I330 Acute and subacute infective endocarditis: Secondary | ICD-10-CM | POA: Diagnosis not present

## 2020-04-29 DIAGNOSIS — Z79899 Other long term (current) drug therapy: Secondary | ICD-10-CM | POA: Diagnosis not present

## 2020-04-29 DIAGNOSIS — B955 Unspecified streptococcus as the cause of diseases classified elsewhere: Secondary | ICD-10-CM | POA: Diagnosis present

## 2020-04-29 DIAGNOSIS — K219 Gastro-esophageal reflux disease without esophagitis: Secondary | ICD-10-CM | POA: Diagnosis present

## 2020-04-29 DIAGNOSIS — E119 Type 2 diabetes mellitus without complications: Secondary | ICD-10-CM | POA: Diagnosis present

## 2020-04-29 DIAGNOSIS — Z8249 Family history of ischemic heart disease and other diseases of the circulatory system: Secondary | ICD-10-CM

## 2020-04-29 DIAGNOSIS — Z951 Presence of aortocoronary bypass graft: Secondary | ICD-10-CM | POA: Diagnosis not present

## 2020-04-29 DIAGNOSIS — Z20822 Contact with and (suspected) exposure to covid-19: Secondary | ICD-10-CM | POA: Diagnosis present

## 2020-04-29 DIAGNOSIS — Z6833 Body mass index (BMI) 33.0-33.9, adult: Secondary | ICD-10-CM | POA: Diagnosis not present

## 2020-04-29 DIAGNOSIS — Z7982 Long term (current) use of aspirin: Secondary | ICD-10-CM

## 2020-04-29 DIAGNOSIS — I2510 Atherosclerotic heart disease of native coronary artery without angina pectoris: Secondary | ICD-10-CM | POA: Diagnosis not present

## 2020-04-29 DIAGNOSIS — I10 Essential (primary) hypertension: Secondary | ICD-10-CM | POA: Diagnosis not present

## 2020-04-29 DIAGNOSIS — Z955 Presence of coronary angioplasty implant and graft: Secondary | ICD-10-CM

## 2020-04-29 DIAGNOSIS — R7881 Bacteremia: Secondary | ICD-10-CM | POA: Diagnosis present

## 2020-04-29 DIAGNOSIS — E871 Hypo-osmolality and hyponatremia: Secondary | ICD-10-CM

## 2020-04-29 DIAGNOSIS — E669 Obesity, unspecified: Secondary | ICD-10-CM | POA: Diagnosis present

## 2020-04-29 DIAGNOSIS — Z7984 Long term (current) use of oral hypoglycemic drugs: Secondary | ICD-10-CM

## 2020-04-29 DIAGNOSIS — I350 Nonrheumatic aortic (valve) stenosis: Secondary | ICD-10-CM | POA: Diagnosis not present

## 2020-04-29 DIAGNOSIS — G4733 Obstructive sleep apnea (adult) (pediatric): Secondary | ICD-10-CM | POA: Diagnosis not present

## 2020-04-29 DIAGNOSIS — E785 Hyperlipidemia, unspecified: Secondary | ICD-10-CM | POA: Diagnosis present

## 2020-04-29 DIAGNOSIS — Z885 Allergy status to narcotic agent status: Secondary | ICD-10-CM | POA: Diagnosis not present

## 2020-04-29 DIAGNOSIS — I517 Cardiomegaly: Secondary | ICD-10-CM | POA: Diagnosis not present

## 2020-04-29 DIAGNOSIS — R509 Fever, unspecified: Secondary | ICD-10-CM | POA: Diagnosis not present

## 2020-04-29 DIAGNOSIS — R05 Cough: Secondary | ICD-10-CM | POA: Diagnosis not present

## 2020-04-29 DIAGNOSIS — I083 Combined rheumatic disorders of mitral, aortic and tricuspid valves: Secondary | ICD-10-CM | POA: Diagnosis not present

## 2020-04-29 LAB — CBC
HCT: 35.4 % — ABNORMAL LOW (ref 39.0–52.0)
Hemoglobin: 11.5 g/dL — ABNORMAL LOW (ref 13.0–17.0)
MCH: 31.3 pg (ref 26.0–34.0)
MCHC: 32.5 g/dL (ref 30.0–36.0)
MCV: 96.5 fL (ref 80.0–100.0)
Platelets: 162 10*3/uL (ref 150–400)
RBC: 3.67 MIL/uL — ABNORMAL LOW (ref 4.22–5.81)
RDW: 13.6 % (ref 11.5–15.5)
WBC: 9.1 10*3/uL (ref 4.0–10.5)
nRBC: 0 % (ref 0.0–0.2)

## 2020-04-29 LAB — COMPREHENSIVE METABOLIC PANEL
ALT: 37 U/L (ref 0–44)
AST: 44 U/L — ABNORMAL HIGH (ref 15–41)
Albumin: 3.4 g/dL — ABNORMAL LOW (ref 3.5–5.0)
Alkaline Phosphatase: 58 U/L (ref 38–126)
Anion gap: 9 (ref 5–15)
BUN: 17 mg/dL (ref 8–23)
CO2: 20 mmol/L — ABNORMAL LOW (ref 22–32)
Calcium: 8.7 mg/dL — ABNORMAL LOW (ref 8.9–10.3)
Chloride: 99 mmol/L (ref 98–111)
Creatinine, Ser: 1.19 mg/dL (ref 0.61–1.24)
GFR calc Af Amer: >60 mL/min (ref >60)
GFR calc non Af Amer: >60 mL/min (ref >60)
Glucose, Bld: 195 mg/dL — ABNORMAL HIGH (ref 70–99)
Potassium: 3.8 mmol/L (ref 3.5–5.1)
Sodium: 128 mmol/L — ABNORMAL LOW (ref 135–145)
Total Bilirubin: 0.6 mg/dL (ref 0.3–1.2)
Total Protein: 7.4 g/dL (ref 6.5–8.1)

## 2020-04-29 LAB — URINALYSIS, ROUTINE W REFLEX MICROSCOPIC
Bilirubin Urine: NEGATIVE
Glucose, UA: NEGATIVE mg/dL
Hgb urine dipstick: NEGATIVE
Ketones, ur: NEGATIVE mg/dL
Leukocytes,Ua: NEGATIVE
Nitrite: NEGATIVE
Protein, ur: NEGATIVE mg/dL
Specific Gravity, Urine: 1.015 (ref 1.005–1.030)
pH: 5 (ref 5.0–8.0)

## 2020-04-29 LAB — LACTIC ACID, PLASMA
Lactic Acid, Venous: 1.6 mmol/L (ref 0.5–1.9)
Lactic Acid, Venous: 1 mmol/L (ref 0.5–1.9)

## 2020-04-29 LAB — CBG MONITORING, ED: Glucose-Capillary: 91 mg/dL (ref 70–99)

## 2020-04-29 LAB — SARS CORONAVIRUS 2 BY RT PCR (HOSPITAL ORDER, PERFORMED IN ~~LOC~~ HOSPITAL LAB): SARS Coronavirus 2: NEGATIVE

## 2020-04-29 MED ORDER — SODIUM CHLORIDE 0.9 % IV SOLN: Freq: Once | INTRAVENOUS | Status: AC

## 2020-04-29 MED ORDER — VANCOMYCIN HCL 1500 MG/300ML IV SOLN: 1500.0000 mg | Freq: Two times a day (BID) | INTRAVENOUS | Status: DC

## 2020-04-29 MED ORDER — VANCOMYCIN HCL 2000 MG/400ML IV SOLN
2000.0000 mg | Freq: Once | INTRAVENOUS | Status: AC
  Administered 2020-04-30: 2000 mg via INTRAVENOUS
  Filled 2020-04-29: qty 400

## 2020-04-29 MED ORDER — VANCOMYCIN HCL 1250 MG/250ML IV SOLN
1250.0000 mg | Freq: Two times a day (BID) | INTRAVENOUS | Status: DC
  Filled 2020-04-29: qty 250

## 2020-04-29 NOTE — Progress Notes (Signed)
Pharmacy Antibiotic Note  Theodore Jenkins is a 74 y.o. male admitted on 04/29/2020 with bacteremia and concerns for endocarditis. Pharmacy has been consulted for Vancomycin dosing.   Height: 5' 7.5" (171.5 cm) Weight: 99.3 kg (219 lb) IBW/kg (Calculated) : 67.25  Temp (24hrs), Avg:98.4 F (36.9 C), Min:98.4 F (36.9 C), Max:98.4 F (36.9 C)  Recent Labs  Lab 04/29/20 1129 04/29/20 1749  WBC 9.1  --   CREATININE 1.19  --   LATICACIDVEN 1.6 1.0    Estimated Creatinine Clearance: 64.5 mL/min (by C-G formula based on SCr of 1.19 mg/dL).    Allergies  Allergen Reactions  . Oxycodone Nausea And Vomiting    Antimicrobials this admission: 5/19 Vancomycin >>   Dose adjustments this admission: N/a  Microbiology results: GPC in blood Cx   Plan:  - Vancomycin 2000mg  IV x 1 dose  - Followed by Vancomycin 1250mg  IV q12h - Monitor patients renal function and urine output  - De-escalate ABX when appropriate   Thank you for allowing pharmacy to be a part of this patient's care.  Duanne Limerick PharmD. BCPS 04/29/2020 9:31 PM

## 2020-04-29 NOTE — ED Provider Notes (Signed)
[facility] EMERGENCY DEPARTMENT Provider Note   CSN: [id number] Arrival date & time: 04/29/20  1102     History No chief complaint on file.   Theodore Jenkins is a 74 y.o. male.  HPI Patient is a 74 year old male with a history of aortic stenosis s/p aortic valve replacement in 2018, CAD with stenting and CABG DM, HLD, HTN, history of malignant hyperthermia obesity  Patient is 74 year old male presented today with approximately 19 days of intermittent fevers, fatigue and malaise.  Patient states that his symptoms began on May 1 with subjective fever and feeling ill.  He states that on May 2 he checked his temperature and found to be 104.  He states that he has had intermittent fevers since that time.  It seems to be worse at night.  Patient states that he had echocardiogram today and after his appointment he was called by his primary care doctor office who informed him that he had blood cultures with bacterial growth.  He was told to go to emergency department for IV antibiotics.  On my review of EMR: Patient had echocardiogram done today.  There is aortic valve sclerosis but no aortic valve regurgitation.     Past Medical History:  Diagnosis Date  . Aortic stenosis    Echo (08/2017): Severe AS with mean gradient of 42 mmHg.  . Barrett's esophagus   . CAD (coronary artery disease) 06/06/2011   Remote stent to the LAD in 2005 // Last cath in 2010 showing diffuse CAD, small PD that is occluded and fills with left to right collaterals, 60-70% 1st OM, with normal EF. Managed medically // Myoview 3/18:  Normal perfusion. LVEF 55% with normal wall motion. This is a low risk study.  . Complication of anesthesia   . Diabetes mellitus   . ED (erectile dysfunction)   . Family history of adverse reaction to anesthesia 10/23/2017   sister had history of malignant hyperthermia when she was 43, she is 72 years old now  . GERD (gastroesophageal reflux disease)   . Hyperlipidemia     . Hypertension   . IHD (ischemic heart disease)    prior stenting of the LAD in December of 2005; with last cath in 2010 showing diffuse CAD with normal LV function. He is managed medically.   . Malignant hyperthermia    sister had reaction concerning for malignant hyperthermia (~ 1988)  . Neuromuscular disorder ([place]) 10/23/2017   lumbar 4 disc in back causing nerve pain, on gabapentin  . Obesity   . Obstructive sleep apnea on CPAP   . PONV (postoperative nausea and vomiting)   . S/P aortic valve replacement with bioprosthetic valve 10/26/2017   25 mm [facility] Ease bovine pericardial tissue valve  . S/P coronary artery stent placement 2005   LAD    Patient Active Problem List   Diagnosis Date Noted  . S/P AVR (aortic valve replacement) 02/15/2018  . S/P aortic valve replacement with bioprosthetic valve  10/26/2017  . S/P CABG x 2 10/26/2017  . Type 2 diabetes mellitus with complication, without long-term current use of insulin ([place])   . Accelerating angina ([place]) 08/18/2017  . Carotid artery disease, unspecified laterality ([place]) 08/08/2016  . Exertional chest pain 10/03/2014  . Aortic valve stenosis 05/03/2014  . ED (erectile dysfunction) 03/20/2012  . Coronary artery disease involving native coronary artery of native heart without angina pectoris 06/06/2011  . Hyperlipidemia LDL goal <70 06/06/2011  . Essential hypertension 06/06/2011  .  Obesities, morbid ([place]) 06/06/2011    Past Surgical History:  Procedure Laterality Date  . AORTIC VALVE REPLACEMENT N/A 10/26/2017   Procedure: AORTIC VALVE REPLACEMENT (AVR), using Magna Ease 25;  Surgeon: Rexene Alberts, MD;  Location: [facility];  Service: Open Heart Surgery;  Laterality: N/A;  . CARDIAC CATHETERIZATION  2010   NORMAL LV FUNCTION. DIFFUSE CORONARY DISEASE WITH THE DISTAL PORTION OF THE SMALL POSTERIOR DESCENDING VESSEL OCCLUDED AND FILLING WITH THE RIGHT COLLATERALS, 60-70% STENOSIS IN THE FIRST OBTUSE MARGINAL VESSEL AND  DIFFUSE SMALL VESSEL DISEASE  . CAROTID ENDARTERECTOMY    . CIRCUMCISION  1985  . CORONARY ARTERY BYPASS GRAFT N/A 10/26/2017   Procedure: CORONARY ARTERY BYPASS GRAFTING (CABG) x two , using left internal mammary artery and right leg greater saphenous vein harvested endoscopically;  Surgeon: Rexene Alberts, MD;  Location: [facility];  Service: Open Heart Surgery;  Laterality: N/A;  . CORONARY STENT PLACEMENT  2005  . RIGHT/LEFT HEART CATH AND CORONARY ANGIOGRAPHY N/A 08/25/2017   Procedure: RIGHT/LEFT HEART CATH AND CORONARY ANGIOGRAPHY;  Surgeon: Nelva Bush, MD;  Location: [facility];  Service: Cardiovascular;  Laterality: N/A;  . rotator cuff surgery Right   . TEE WITHOUT CARDIOVERSION N/A 10/26/2017   Procedure: TRANSESOPHAGEAL ECHOCARDIOGRAM (TEE);  Surgeon: Rexene Alberts, MD;  Location: [facility];  Service: Open Heart Surgery;  Laterality: N/A;       Family History  Problem Relation Age of Onset  . Cancer Mother   . Heart attack Father 61  . CAD Brother     Social History   Tobacco Use  . Smoking status: Former Smoker    Packs/day: 1.00    Years: 20.00    Pack years: 20.00    Types: Cigarettes    Quit date: 12/12/1992    Years since quitting: 27.3  . Smokeless tobacco: Never Used  Substance Use Topics  . Alcohol use: Yes    Alcohol/week: 1.0 standard drinks    Types: 1 Cans of beer per week  . Drug use: No    Home Medications Prior to Admission medications   Medication Sig Start Date End Date Taking? Authorizing Provider  acetaminophen (TYLENOL) 500 MG tablet Take 1,000 mg by mouth every 8 (eight) hours as needed for headache.   Yes [provider]  aspirin 81 MG EC tablet Take 1 tablet (81 mg total) by mouth daily. 03/19/18  Yes Rexene Alberts, MD  doxycycline (VIBRA-TABS) 100 MG tablet Take 100 mg by mouth 2 (two) times daily. Start date : 04/14/20 04/14/20  Yes [provider]  fluticasone (FLONASE) 50 MCG/ACT nasal spray Place 2 sprays  into both nostrils daily as needed for allergies or rhinitis.    Yes [provider]  gabapentin (NEURONTIN) 300 MG capsule Take 600 mg by mouth daily.   Yes [provider]  lisinopril (ZESTRIL) 5 MG tablet Take 1 tablet (5 mg total) by mouth daily. 09/10/19  Yes Burnell Blanks, MD  loratadine (CLARITIN) 10 MG tablet Take 10 mg by mouth daily.   Yes [provider]  metoprolol succinate (TOPROL-XL) 50 MG 24 hr tablet Take 50 mg by mouth daily. Take with or immediately following a meal.   Yes [provider]  Multiple Vitamins-Minerals (ONE-A-DAY 50 PLUS PO) Take 1 tablet by mouth daily.   Yes [provider]  nitroGLYCERIN (NITROSTAT) 0.4 MG SL tablet Place 1 tablet (0.4 mg total) under the tongue every 5 (five) minutes as needed  for chest pain. 04/19/19 04/29/20 Yes Burnell Blanks, MD  omeprazole (PRILOSEC OTC) 20 MG tablet Take 20 mg by mouth daily.     Yes [provider]  rosuvastatin (CRESTOR) 10 MG tablet Take 10 mg by mouth daily.   Yes [provider]  sitaGLIPtin-metformin (JANUMET) 50-1000 MG tablet Take 1 tablet by mouth 2 (two) times daily with a meal.  12/19/16  Yes [provider]  ONETOUCH VERIO test strip TEST BS ONCE D 12/29/17   [provider]    Allergies    Oxycodone  Review of Systems   Review of Systems  Constitutional: Positive for chills, fatigue and fever.  HENT: Negative for congestion.   Eyes: Negative for pain.  Respiratory: Negative for cough and shortness of breath.   Cardiovascular: Negative for chest pain and leg swelling.  Gastrointestinal: Negative for abdominal pain and vomiting.  Genitourinary: Negative for dysuria.       Dark urine  Musculoskeletal: Negative for myalgias.  Skin: Negative for rash.  Neurological: Negative for dizziness and headaches.    Physical Exam Updated Vital Signs BP (!) 118/59 (BP Location: Left Arm)   Pulse 69   Temp 98.4 F  (36.9 C) (Oral)   Resp 16   SpO2 100%   Physical Exam Vitals and nursing note reviewed.  Constitutional:      General: He is not in acute distress. HENT:     Head: Normocephalic and atraumatic.     Nose: Nose normal.     Mouth/Throat:     Mouth: Mucous membranes are moist.  Eyes:     General: No scleral icterus. Cardiovascular:     Rate and Rhythm: Normal rate and regular rhythm.     Pulses: Normal pulses.     Heart sounds: Normal heart sounds.  Pulmonary:     Effort: Pulmonary effort is normal. No respiratory distress.     Breath sounds: No wheezing.  Abdominal:     Palpations: Abdomen is soft.     Tenderness: There is no abdominal tenderness. There is no guarding.     Comments: Protuberant, nontender abdomen.  No tenderness to palpation was light or deep palpation.  No guarding or rebound tenderness.  No CVA tenderness.  Musculoskeletal:     Cervical back: Normal range of motion.     Right lower leg: No edema.     Left lower leg: No edema.  Skin:    General: Skin is warm and dry.     Capillary Refill: Capillary refill takes less than 2 seconds.     Comments: Right thumb with questionable splinter hemorrhage  Neurological:     Mental Status: He is alert. Mental status is at baseline.  Psychiatric:        Mood and Affect: Mood normal.        Behavior: Behavior normal.     ED Results / Procedures / Treatments   Labs (all labs ordered are listed, but only abnormal results are displayed) Labs Reviewed  CBC - Abnormal; Notable for the following components:      Result Value   RBC 3.67 (*)    Hemoglobin 11.5 (*)    HCT 35.4 (*)    All other components within normal limits  COMPREHENSIVE METABOLIC PANEL - Abnormal; Notable for the following components:   Sodium 128 (*)    CO2 20 (*)    Glucose, Bld 195 (*)    Calcium 8.7 (*)    Albumin 3.4 (*)  AST 44 (*)    All other components within normal limits  LACTIC ACID, PLASMA  LACTIC ACID, PLASMA     EKG None  Radiology No results found.  Procedures .Critical Care Performed by: Tedd Sias, PA Authorized by: Tedd Sias, PA   Critical care provider statement:    Critical care time (minutes):  35   Critical care time was exclusive of:  Separately billable procedures and treating other patients and teaching time   Critical care was necessary to treat or prevent imminent or life-threatening deterioration of the following conditions: bacteremia; possible endocarditis.   Critical care was time spent personally by me on the following activities:  Discussions with consultants, evaluation of patient's response to treatment, examination of patient, review of old charts, re-evaluation of patient's condition, pulse oximetry, ordering and review of radiographic studies, ordering and review of laboratory studies and ordering and performing treatments and interventions   I assumed direction of critical care for this patient from another provider in my specialty: no     (including critical care time)  Medications Ordered in ED Medications - No data to display  ED Course  I have reviewed the triage vital signs and the nursing notes.  Pertinent labs & imaging results that were available during my care of the patient were reviewed by me and considered in my medical decision making (see chart for details).  Patient is 74 year old male with positive blood cultures from PCP office presented today with intermittent fevers for several weeks.  Concern for endocarditis.  Physical exam relatively unremarkable apart from some questionable splinter hemorrhages on the right thumb.  We will consult infectious disease.  Clinical Course as of May 01 30  Wed Apr 29, 2020  1834 CMP with mild hyponatremia with sodium of 128.  This is corrected for his glucose found to be 130.  This is not significantly abnormal from his baseline which appears to be in the low 130s.  Glucose 195.   [WF]  [id number]  CBC with no leukocytosis.  Stable anemia.  CBC(!) [WF]  1836 Plain x-ray of chest without acute abnormality.  No infiltrate or evidence of infection.   [WF]  2113 Discussed with Dr. Marlowe Sax of internal medicine who will see patient for follow-up on blood cultures and IV antibiotics.  Per infectious disease recommendations will consult pharmacy for initiation of vancomycin at 1 AM tomorrow morning.  I discussed with Corene Cornea who is emergency medicine pharmacist on call who recommended placing thing for pharmacy consult stat and states that he will initiate antibiotics at 1 AM after repeat cultures are drawn.   [WF]    Clinical Course User Index [WF] Tedd Sias, PA   I discussed this case with my attending physician who cosigned this note including patient's presenting symptoms, physical exam, and planned diagnostics and interventions. Attending physician stated agreement with plan or made changes to plan which were implemented.   Attending physician assessed patient at bedside.  Discussed with Dr. Baxter Flattery of infectious disease who requests repeat blood cultures done at 10 PM and bank beginning at 1 AM.  She requests hospitalist admission and will follow patient during hospital stay. Likely will need TEE. Will discuss plan with patient.   Discussed with internal medicine.  He will be admitted for IV antibiotics and follow-up on cultures.  MDM Rules/Calculators/A&P                      Final Clinical Impression(s) / ED  Diagnoses Final diagnoses:  None    Rx / DC Orders ED Discharge Orders    None       Tedd Sias, [place] 05/01/20 0033    Margette Fast, MD 05/06/20 [phone]

## 2020-04-29 NOTE — ED Triage Notes (Signed)
Pt sent over by his primary MD for positive blood cultures for Iv antibiotics due to having a heart valve replacement here at [facility] , fever yesterday , none today

## 2020-04-30 ENCOUNTER — Other Ambulatory Visit: Payer: Self-pay

## 2020-04-30 DIAGNOSIS — E871 Hypo-osmolality and hyponatremia: Secondary | ICD-10-CM

## 2020-04-30 DIAGNOSIS — R7881 Bacteremia: Secondary | ICD-10-CM

## 2020-04-30 DIAGNOSIS — I10 Essential (primary) hypertension: Secondary | ICD-10-CM

## 2020-04-30 LAB — URINE CULTURE: Culture: NO GROWTH

## 2020-04-30 LAB — BLOOD CULTURE ID PANEL (REFLEXED)

## 2020-04-30 LAB — HEMOGLOBIN A1C
Hgb A1c MFr Bld: 6.6 % — ABNORMAL HIGH (ref 4.8–5.6)
Mean Plasma Glucose: 142.72 mg/dL

## 2020-04-30 LAB — GLUCOSE, CAPILLARY
Glucose-Capillary: 98 mg/dL (ref 70–99)
Glucose-Capillary: 123 mg/dL — ABNORMAL HIGH (ref 70–99)
Glucose-Capillary: 98 mg/dL (ref 70–99)
Glucose-Capillary: 125 mg/dL — ABNORMAL HIGH (ref 70–99)

## 2020-04-30 MED ORDER — ENOXAPARIN SODIUM 40 MG/0.4ML ~~LOC~~ SOLN
40.0000 mg | Freq: Every day | SUBCUTANEOUS | Status: DC
  Administered 2020-04-30 – 2020-05-04 (×5): 40 mg via SUBCUTANEOUS
  Filled 2020-04-30 (×5): qty 0.4

## 2020-04-30 MED ORDER — SODIUM CHLORIDE 0.9 % IV SOLN: INTRAVENOUS | Status: AC

## 2020-04-30 MED ORDER — SITAGLIPTIN PHOS-METFORMIN HCL 50-1000 MG PO TABS: 1.0000 | ORAL_TABLET | Freq: Two times a day (BID) | ORAL | Status: DC

## 2020-04-30 MED ORDER — FLUTICASONE PROPIONATE 50 MCG/ACT NA SUSP: 2.0000 | Freq: Every day | NASAL | Status: DC | PRN

## 2020-04-30 MED ORDER — ROSUVASTATIN CALCIUM 5 MG PO TABS
10.0000 mg | ORAL_TABLET | Freq: Every day | ORAL | Status: DC
  Administered 2020-04-30 – 2020-05-04 (×5): 10 mg via ORAL
  Filled 2020-04-30 (×5): qty 2

## 2020-04-30 MED ORDER — METOPROLOL SUCCINATE ER 25 MG PO TB24
50.0000 mg | ORAL_TABLET | Freq: Every day | ORAL | Status: DC
  Administered 2020-04-30 – 2020-05-04 (×5): 50 mg via ORAL
  Filled 2020-04-30 (×5): qty 2

## 2020-04-30 MED ORDER — LORATADINE 10 MG PO TABS
10.0000 mg | ORAL_TABLET | Freq: Every day | ORAL | Status: DC
  Administered 2020-04-30 – 2020-05-04 (×5): 10 mg via ORAL
  Filled 2020-04-30 (×5): qty 1

## 2020-04-30 MED ORDER — ENSURE ENLIVE PO LIQD
237.0000 mL | Freq: Two times a day (BID) | ORAL | Status: DC
  Administered 2020-04-30: 237 mL via ORAL

## 2020-04-30 MED ORDER — METFORMIN HCL 500 MG PO TABS
1000.0000 mg | ORAL_TABLET | Freq: Two times a day (BID) | ORAL | Status: DC
  Filled 2020-04-30: qty 2

## 2020-04-30 MED ORDER — ACETAMINOPHEN 500 MG PO TABS
1000.0000 mg | ORAL_TABLET | Freq: Three times a day (TID) | ORAL | Status: DC | PRN
  Administered 2020-04-30 – 2020-05-04 (×9): 1000 mg via ORAL
  Filled 2020-04-30 (×9): qty 2

## 2020-04-30 MED ORDER — LISINOPRIL 5 MG PO TABS
5.0000 mg | ORAL_TABLET | Freq: Every day | ORAL | Status: DC
  Administered 2020-04-30 – 2020-05-04 (×5): 5 mg via ORAL
  Filled 2020-04-30 (×5): qty 1

## 2020-04-30 MED ORDER — LINAGLIPTIN 5 MG PO TABS: 5.0000 mg | ORAL_TABLET | Freq: Every day | ORAL | Status: DC

## 2020-04-30 MED ORDER — INSULIN ASPART 100 UNIT/ML ~~LOC~~ SOLN: 0.0000 [IU] | Freq: Every day | SUBCUTANEOUS | Status: DC

## 2020-04-30 MED ORDER — SODIUM CHLORIDE 0.9 % IV SOLN
2.0000 g | INTRAVENOUS | Status: DC
  Administered 2020-04-30 – 2020-05-04 (×5): 2 g via INTRAVENOUS
  Filled 2020-04-30: qty 2
  Filled 2020-04-30 (×2): qty 20
  Filled 2020-04-30 (×3): qty 2

## 2020-04-30 MED ORDER — WHITE PETROLATUM EX OINT
TOPICAL_OINTMENT | CUTANEOUS | Status: AC
  Filled 2020-04-30: qty 28.35

## 2020-04-30 MED ORDER — ASPIRIN EC 81 MG PO TBEC
81.0000 mg | DELAYED_RELEASE_TABLET | Freq: Every day | ORAL | Status: DC
  Administered 2020-04-30 – 2020-05-04 (×5): 81 mg via ORAL
  Filled 2020-04-30 (×5): qty 1

## 2020-04-30 MED ORDER — PANTOPRAZOLE SODIUM 40 MG PO TBEC
40.0000 mg | DELAYED_RELEASE_TABLET | Freq: Every day | ORAL | Status: DC
  Administered 2020-04-30 – 2020-05-04 (×5): 40 mg via ORAL
  Filled 2020-04-30 (×5): qty 1

## 2020-04-30 MED ORDER — METHOCARBAMOL 500 MG PO TABS
500.0000 mg | ORAL_TABLET | Freq: Once | ORAL | Status: AC
  Administered 2020-04-30: 500 mg via ORAL
  Filled 2020-04-30: qty 1

## 2020-04-30 MED ORDER — INSULIN ASPART 100 UNIT/ML ~~LOC~~ SOLN
0.0000 [IU] | Freq: Three times a day (TID) | SUBCUTANEOUS | Status: DC
  Administered 2020-04-30: 1 [IU] via SUBCUTANEOUS
  Administered 2020-05-01: 2 [IU] via SUBCUTANEOUS
  Administered 2020-05-02: 1 [IU] via SUBCUTANEOUS
  Administered 2020-05-02: 2 [IU] via SUBCUTANEOUS
  Administered 2020-05-03 – 2020-05-04 (×3): 1 [IU] via SUBCUTANEOUS

## 2020-04-30 MED ORDER — GABAPENTIN 300 MG PO CAPS
600.0000 mg | ORAL_CAPSULE | Freq: Every day | ORAL | Status: DC
  Administered 2020-04-30 – 2020-05-04 (×6): 600 mg via ORAL
  Filled 2020-04-30 (×5): qty 2

## 2020-04-30 NOTE — Progress Notes (Signed)
Yellow MEWS guidline implemented

## 2020-04-30 NOTE — Consult Note (Addendum)
[facility] [facility] for Infectious Disease    Date of Admission:  04/29/2020   Total days of antibiotics: 1        Day 1: Ceftriaxone       Reason for Consult: Bacteremia    Referring Provider: Dr. Wyline Copas Primary Care Provider: Dr. Vista Lawman  Assessment: Theodore Jenkins is a 74 y.o. male with a PMHx of bioprosthetic valve replacement (2018), CAD s/p CABG, T2DM, HTN, HLD who presents to the ED with c/o month-long fevers and subsequently admitted for bacteremia based on cultures obtained at PCP's office (Gram positive cocci).   On admission, patient was febrile up to 102 but hemodynamically stable without evidence of shock. Blood cultures are currently growing Strep species in 8/8 bottles; speciation still pending. Source unknown at this time, as patient denies any oral, pulmonary, GI, GU or skin symptoms that would be explanatory. Will await speciation for additional information on source. Given patient's ongoing fever x 1 month, it is very likely he has been bacteremia for at least 4 weeks.   No evidence at this time of any joint seeding. Will need close monitoring for any acute changes.   Patient's initial TTE on 04/29/2020 did not show any evidence of endocarditis, but given his high risk with his prosthetic aortic valve, will pursue a TEE.   Plan: 1. Discontinue Vancomycin 2. Begin Ceftriaxone per pharmacy  3. Continue to follow cultures for speciation  4. TEE tomorrow   Principal Problem:   Bacteremia Active Problems:   Coronary artery disease involving native coronary artery of native heart without angina pectoris   Hyperlipidemia LDL goal <70   Essential hypertension   Hyponatremia   . aspirin EC  81 mg Oral Daily  . enoxaparin (LOVENOX) injection  40 mg Subcutaneous Daily  . feeding supplement (ENSURE ENLIVE)  237 mL Oral BID BM  . gabapentin  600 mg Oral Daily  . insulin aspart  0-5 Units Subcutaneous QHS  . insulin aspart  0-9 Units Subcutaneous TID WC  . lisinopril  5  mg Oral Daily  . loratadine  10 mg Oral Daily  . metoprolol succinate  50 mg Oral Daily  . pantoprazole  40 mg Oral Daily  . rosuvastatin  10 mg Oral Daily    HPI: Theodore Jenkins is a 74 y.o. male with a PMHx of bioprosthetic valve replacement (2018), CAD s/p CABG, T2DM, HTN, HLD who presents to the ED with c/o month-long fevers and subsequently admitted for bacteremia based on cultures obtained at [facility] office.   Theodore Jenkins states he was in his normal state of health until approximately April 30th, when he began to develop a fever overnight with a T. Max of 104.5. At the time, he also endorses myalgias, headaches and generalized weakness. He was able to make an appointment at his PCP's office for May 4th, at which time he was tested for COVID-19 and negative. He was started on a 10 day course of Doxycycline for presumed URI. Theodore Jenkins states he felt better towards the end of this course. A day or two after finishing Doxycycline, he again began to develop intermittent fevers that were only relieved by Tylenol but would quickly re-occur. He also had myalgias and generalized weakness again. He went back to his PCP's office on the 18th of May, at which time blood cultures were drawn and TTE ordered. TTE did not show any valvular vegetations. He was subsequently called by the office and  informed his blood cultures were growing gram positive cocci and he was instructed to come to the ED.   Over the past 4-5 weeks, Theodore Jenkins endorses some upper left molar pain but denies any swelling, redness or purulent drainage. He has teeth cleanings every 6 months and his last was 5 months ago. Last dental work was over 1 year ago at which time all his upper teeth were removed and exchanged for implants. He denies any vision changes, dizziness, neck pain, chest pain, SOB, palpitations, leg swelling, vomiting, abdominal pain, diarrhea, stool changes, melena, hematochezia, dysuria, urinary frequency/urgency, skin lesions, rashes.  He endorses chronic back pain that is unchanged in intensity or nature.   Review of Systems: Review of Systems  Constitutional: Positive for diaphoresis, fever and malaise/fatigue.  HENT: Negative for congestion, sinus pain and sore throat.   Eyes: Negative for blurred vision and double vision.  Respiratory: Negative for cough, sputum production, shortness of breath and wheezing.   Cardiovascular: Negative for chest pain, palpitations, orthopnea and leg swelling.  Gastrointestinal: Negative for abdominal pain, blood in stool, constipation, diarrhea, melena, nausea and vomiting.  Genitourinary: Negative for dysuria, frequency, hematuria and urgency.  Musculoskeletal: Positive for back pain. Negative for falls, joint pain, myalgias and neck pain.  Skin: Negative for itching and rash.  Neurological: Positive for weakness and headaches. Negative for dizziness and focal weakness.    Past Medical History:  Diagnosis Date  . Aortic stenosis    Echo (08/2017): Severe AS with mean gradient of 42 mmHg.  . Barrett's esophagus   . CAD (coronary artery disease) 06/06/2011   Remote stent to the LAD in 2005 // Last cath in 2010 showing diffuse CAD, small PD that is occluded and fills with left to right collaterals, 60-70% 1st OM, with normal EF. Managed medically // Myoview 3/18:  Normal perfusion. LVEF 55% with normal wall motion. This is a low risk study.  . Complication of anesthesia   . Diabetes mellitus   . ED (erectile dysfunction)   . Family history of adverse reaction to anesthesia 10/23/2017   sister had history of malignant hyperthermia when she was 55, she is 72 years old now  . GERD (gastroesophageal reflux disease)   . Hyperlipidemia   . Hypertension   . IHD (ischemic heart disease)    prior stenting of the LAD in December of 2005; with last cath in 2010 showing diffuse CAD with normal LV function. He is managed medically.   . Malignant hyperthermia    sister had reaction concerning for  malignant hyperthermia (~ 1988)  . Neuromuscular disorder ([place]) 10/23/2017   lumbar 4 disc in back causing nerve pain, on gabapentin  . Obesity   . Obstructive sleep apnea on CPAP   . PONV (postoperative nausea and vomiting)   . S/P aortic valve replacement with bioprosthetic valve 10/26/2017   25 mm [facility] Ease bovine pericardial tissue valve  . S/P coronary artery stent placement 2005   LAD    Social History   Tobacco Use  . Smoking status: Former Smoker    Packs/day: 1.00    Years: 20.00    Pack years: 20.00    Types: Cigarettes    Quit date: 12/12/1992    Years since quitting: 27.4  . Smokeless tobacco: Never Used  Substance Use Topics  . Alcohol use: Yes    Alcohol/week: 1.0 standard drinks    Types: 1 Cans of beer per week  . Drug use: No  Family History  Problem Relation Age of Onset  . Cancer Mother   . Heart attack Father 34  . CAD Brother    Allergies  Allergen Reactions  . Oxycodone Nausea And Vomiting    OBJECTIVE: Blood pressure 122/60, pulse 67, temperature 98.8 F (37.1 C), temperature source Oral, resp. rate 18, height 5' 7.5" (1.715 m), weight 99.3 kg, SpO2 98 %.  Physical Exam Vitals and nursing note reviewed.  Constitutional:      General: He is not in acute distress.    Appearance: He is obese.  HENT:     Head: Normocephalic and atraumatic.  Cardiovascular:     Rate and Rhythm: Normal rate and regular rhythm.     Heart sounds: Murmur (holosystolic murmur) present.  Pulmonary:     Effort: Pulmonary effort is normal. No respiratory distress.     Breath sounds: No wheezing, rhonchi or rales.  Abdominal:     General: Bowel sounds are normal. There is no distension.     Palpations: Abdomen is soft.     Tenderness: There is no abdominal tenderness. There is no guarding.  Musculoskeletal:        General: No tenderness, deformity or signs of injury.  Skin:    General: Skin is warm and dry.     Findings: No lesion or rash.    Neurological:     General: No focal deficit present.     Mental Status: He is alert and oriented to person, place, and time. Mental status is at baseline.  Psychiatric:        Mood and Affect: Mood normal.        Behavior: Behavior normal.    Lab Results Lab Results  Component Value Date   WBC 9.1 04/29/2020   HGB 11.5 (L) 04/29/2020   HCT 35.4 (L) 04/29/2020   MCV 96.5 04/29/2020   PLT 162 04/29/2020    Lab Results  Component Value Date   CREATININE 1.19 04/29/2020   BUN 17 04/29/2020   NA 128 (L) 04/29/2020   K 3.8 04/29/2020   CL 99 04/29/2020   CO2 20 (L) 04/29/2020    Lab Results  Component Value Date   ALT 37 04/29/2020   AST 44 (H) 04/29/2020   ALKPHOS 58 04/29/2020   BILITOT 0.6 04/29/2020     Microbiology: Recent Results (from the past 240 hour(s))  Culture, blood (routine x 2)     Status: None (Preliminary result)   Collection Time: 04/29/20  5:42 PM   Specimen: BLOOD LEFT FOREARM  Result Value Ref Range Status   Specimen Description BLOOD LEFT FOREARM  Final   Special Requests   Final    BOTTLES DRAWN AEROBIC AND ANAEROBIC Blood Culture adequate volume   Culture  Setup Time   Final    IN BOTH AEROBIC AND ANAEROBIC BOTTLES GRAM POSITIVE COCCI   Culture   Final    NO GROWTH < 24 HOURS Performed at [facility], [place] [address], [place], [place] [address]    Report Status PENDING  Incomplete  Culture, blood (routine x 2)     Status: None (Preliminary result)   Collection Time: 04/29/20  5:49 PM   Specimen: BLOOD  Result Value Ref Range Status   Specimen Description BLOOD RIGHT ANTECUBITAL  Final   Special Requests   Final    BOTTLES DRAWN AEROBIC AND ANAEROBIC Blood Culture adequate volume   Culture  Setup Time   Final    IN BOTH  AEROBIC AND ANAEROBIC BOTTLES GRAM POSITIVE COCCI CRITICAL RESULT CALLED TO, READ BACK BY AND VERIFIED WITH: Maryann Alar PT:[id number] [place] Performed at [facility], [place] [address] [place], [place]  [address]    Culture GRAM POSITIVE COCCI  Final   Report Status PENDING  Incomplete  Blood Culture ID Panel (Reflexed)     Status: Abnormal   Collection Time: 04/29/20  5:49 PM  Result Value Ref Range Status   Enterococcus species NOT DETECTED NOT DETECTED Final   Listeria monocytogenes NOT DETECTED NOT DETECTED Final   Staphylococcus species NOT DETECTED NOT DETECTED Final   Staphylococcus aureus (BCID) NOT DETECTED NOT DETECTED Final   Streptococcus species DETECTED (A) NOT DETECTED Final    Comment: Not Enterococcus species, Streptococcus agalactiae, Streptococcus pyogenes, or Streptococcus pneumoniae. CRITICAL RESULT CALLED TO, READ BACK BY AND VERIFIED WITH: PHARMD Gorden Harms PT:[id number] [facility]    Streptococcus agalactiae NOT DETECTED NOT DETECTED Final   Streptococcus pneumoniae NOT DETECTED NOT DETECTED Final   Streptococcus pyogenes NOT DETECTED NOT DETECTED Final   Acinetobacter baumannii NOT DETECTED NOT DETECTED Final   Enterobacteriaceae species NOT DETECTED NOT DETECTED Final   Enterobacter cloacae complex NOT DETECTED NOT DETECTED Final   Escherichia coli NOT DETECTED NOT DETECTED Final   Klebsiella oxytoca NOT DETECTED NOT DETECTED Final   Klebsiella pneumoniae NOT DETECTED NOT DETECTED Final   Proteus species NOT DETECTED NOT DETECTED Final   Serratia marcescens NOT DETECTED NOT DETECTED Final   Haemophilus influenzae NOT DETECTED NOT DETECTED Final   Neisseria meningitidis NOT DETECTED NOT DETECTED Final   Pseudomonas aeruginosa NOT DETECTED NOT DETECTED Final   Candida albicans NOT DETECTED NOT DETECTED Final   Candida glabrata NOT DETECTED NOT DETECTED Final   Candida krusei NOT DETECTED NOT DETECTED Final   Candida parapsilosis NOT DETECTED NOT DETECTED Final   Candida tropicalis NOT DETECTED NOT DETECTED Final    Comment: Performed at [facility], [place] [address] [place], [place] [address]  SARS Coronavirus 2 by RT PCR (hospital order, performed in [facility]  hospital lab) Nasopharyngeal Nasopharyngeal Swab     Status: None   Collection Time: 04/29/20  8:02 PM   Specimen: Nasopharyngeal Swab  Result Value Ref Range Status   SARS Coronavirus 2 NEGATIVE NEGATIVE Final    Comment: (NOTE) SARS-CoV-2 target nucleic acids are NOT DETECTED. The SARS-CoV-2 RNA is generally detectable in upper and lower respiratory specimens during the acute phase of infection. The lowest concentration of SARS-CoV-2 viral copies this assay can detect is 250 copies / mL. A negative result does not preclude SARS-CoV-2 infection and should not be used as the sole basis for treatment or other patient management decisions.  A negative result may occur with improper specimen collection / handling, submission of specimen other than nasopharyngeal swab, presence of viral mutation(s) within the areas targeted by this assay, and inadequate number of viral copies (<250 copies / mL). A negative result must be combined with clinical observations, patient history, and epidemiological information. Fact Sheet for Patients:   StrictlyIdeas.no Fact Sheet for Healthcare Providers: BankingDealers.co.za This test is not yet approved or cleared  by the [place] FDA and has been authorized for detection and/or diagnosis of SARS-CoV-2 by FDA under an Emergency Use Authorization (EUA).  This EUA will remain in effect (meaning this test can be used) for the duration of the COVID-19 declaration under Section 564(b)(1) of the Act, 21 U.S.C. section 360bbb-3(b)(1), unless the authorization is  terminated or revoked sooner. Performed at [facility], [place] [address], [place], [place] [address]   Blood culture (routine x 2)     Status: None (Preliminary result)   Collection Time: 04/29/20 10:02 PM   Specimen: BLOOD LEFT HAND  Result Value Ref Range Status   Specimen Description BLOOD LEFT HAND  Final   Special Requests   Final     BOTTLES DRAWN AEROBIC AND ANAEROBIC Blood Culture adequate volume   Culture  Setup Time   Final    GRAM POSITIVE COCCI IN PAIRS IN CHAINS IN BOTH AEROBIC AND ANAEROBIC BOTTLES CRITICAL RESULT CALLED TO, READ BACK BY AND VERIFIED WITH: T BAUMEISTER,PHARMD AT [id number] 04/30/20 BY L BENFIELD Performed at [facility], [place] [address] [place], [place] [address]    Culture GRAM POSITIVE COCCI  Final   Report Status PENDING  Incomplete  Blood culture (routine x 2)     Status: None (Preliminary result)   Collection Time: 04/29/20 10:02 PM   Specimen: BLOOD  Result Value Ref Range Status   Specimen Description BLOOD LEFT ANTECUBITAL  Final   Special Requests   Final    BOTTLES DRAWN AEROBIC AND ANAEROBIC Blood Culture results may not be optimal due to an excessive volume of blood received in culture bottles   Culture  Setup Time   Final    GRAM POSITIVE COCCI IN PAIRS IN CHAINS IN BOTH AEROBIC AND ANAEROBIC BOTTLES CRITICAL VALUE NOTED.  VALUE IS CONSISTENT WITH PREVIOUSLY REPORTED AND CALLED VALUE. Performed at [facility], [place] [address] [place], [place] [address]    Culture [facility]  Final   Report Status PENDING  Incomplete   Dr. Jose Persia Internal Medicine PGY-1  04/30/2020, 11:35 AM

## 2020-04-30 NOTE — Progress Notes (Signed)
PHARMACY - PHYSICIAN COMMUNICATION CRITICAL VALUE ALERT - BLOOD CULTURE IDENTIFICATION (BCID)  Theodore Jenkins is an 74 y.o. male who presented to [facility] on 04/29/2020 with a chief complaint of fever and malaise  Assessment:  Patient had 4 sets of BC drawn on 5/19, the first two sets drawn at 1742 were 4/4 GPC found to be strep species. Pt now has 1/4 bottles positive from the 2 sets drawn at 2202 showing GPC in chains. They will not run the BCID, we will have a species tomorrow but expected to be strep as well.   Name of physician (or Provider) Contacted: Dr. Earlie Counts  Current antibiotics: Ceftriaxone   Changes to prescribed antibiotics recommended:  Recommendations accepted by provider  Continue current antibiotics   Results for orders placed or performed during the hospital encounter of 04/29/20  Blood Culture ID Panel (Reflexed) (Collected: 04/29/2020  5:49 PM)  Result Value Ref Range   Enterococcus species NOT DETECTED NOT DETECTED   Listeria monocytogenes NOT DETECTED NOT DETECTED   Staphylococcus species NOT DETECTED NOT DETECTED   Staphylococcus aureus (BCID) NOT DETECTED NOT DETECTED   Streptococcus species DETECTED (A) NOT DETECTED   Streptococcus agalactiae NOT DETECTED NOT DETECTED   Streptococcus pneumoniae NOT DETECTED NOT DETECTED   Streptococcus pyogenes NOT DETECTED NOT DETECTED   Acinetobacter baumannii NOT DETECTED NOT DETECTED   Enterobacteriaceae species NOT DETECTED NOT DETECTED   Enterobacter cloacae complex NOT DETECTED NOT DETECTED   Escherichia coli NOT DETECTED NOT DETECTED   Klebsiella oxytoca NOT DETECTED NOT DETECTED   Klebsiella pneumoniae NOT DETECTED NOT DETECTED   Proteus species NOT DETECTED NOT DETECTED   Serratia marcescens NOT DETECTED NOT DETECTED   Haemophilus influenzae NOT DETECTED NOT DETECTED   Neisseria meningitidis NOT DETECTED NOT DETECTED   Pseudomonas aeruginosa NOT DETECTED NOT DETECTED   Candida albicans NOT DETECTED NOT DETECTED   Candida glabrata NOT DETECTED NOT DETECTED   Candida krusei NOT DETECTED NOT DETECTED   Candida parapsilosis NOT DETECTED NOT DETECTED   Candida tropicalis NOT DETECTED NOT DETECTED    Phillis Haggis 04/30/2020  9:47 AM

## 2020-04-30 NOTE — Progress Notes (Signed)
Nutrition Brief Note  Patient identified on the Malnutrition Screening Tool (MST) Report  Wt Readings from Last 15 Encounters:  04/29/20 99.3 kg  10/04/19 90.8 kg  09/30/19 90.1 kg  04/15/19 101.9 kg  12/24/18 103.9 kg  10/22/18 105.5 kg  10/01/18 107.3 kg  05/24/18 104.3 kg  03/19/18 103.4 kg  02/15/18 104.2 kg  12/11/17 101.2 kg  11/15/17 101.2 kg  11/01/17 102.3 kg  10/23/17 108.1 kg  10/23/17 108.1 kg   Theodore Jenkins is a 74 y.o. male with medical history significant of severe aortic stenosis status post bioprosthetic aortic valve replacement in 2018, CAD status post PCI and CABG, diabetes, hypertension, hyperlipidemia, GERD, obesity (BMI 33.79), OSA on CPAP presenting to the ED for evaluation of fevers and malaise.  He was sent to the ED by his PCP for IV antibiotics as recent blood cultures done 5/18 grew gram positive cocci. Echo done 5/19 negative for vegetation.  Pt admitted with bacteremia.   Reviewed I/O's: +260 ml x 24 hours  UOP: 600 ml x 24 hours  Pt in with infectious disease at time of visit.   Wt has been stable over the past year.   Medications reviewed and include 0.9% sodium chloride infusion @ 125 ml/hr.   Lab Results  Component Value Date   HGBA1C 6.6 (H) 04/30/2020   PTA DM medications are 50-100 mg sitagliptin-metformin BID.   Labs reviewed: CBGS: 91 (inpatient orders for glycemic control are 0-15 units insulin aspart q HS and 0-9 units insulin aspart TID with meals).    Current diet order is heart healthy/ carb modified, patient is consuming approximately 75% of meals at this time. Labs and medications reviewed.   No nutrition interventions warranted at this time. If nutrition issues arise, please consult RD.   [name], RD, LDN, [place] Registered Dietitian II Certified Diabetes Care and Education Specialist Please refer to [facility] for RD and/or RD on-call/weekend/after hours pager

## 2020-04-30 NOTE — TOC Initial Note (Signed)
Transition of Care [facility]) - Initial/Assessment Note    Patient Details  Name: Theodore Jenkins MRN: [id number] Date of Birth: Aug 30, 1946  [facility] CM/SW Contact:    Marilu Favre, RN Phone Number: 04/30/2020, 10:21 AM  Clinical Narrative:                  Spoke to patient and wife at bedside. Confirmed face sheet information. From home independent, active with PCP. Will continue to follow for discharge needs.  Expected Discharge Plan: Home/Self Care Barriers to Discharge: Continued Medical Work up   Patient Goals and CMS Choice Patient states their goals for this hospitalization and ongoing recovery are:: to return to home CMS Medicare.gov Compare Post Acute Care list provided to:: Patient Choice offered to / list presented to : NA  Expected Discharge Plan and Services Expected Discharge Plan: Home/Self Care   Discharge Planning Services: CM Consult   Living arrangements for the past 2 months: Single Family Home                   DME Agency: NA       HH Arranged: NA          Prior Living Arrangements/Services Living arrangements for the past 2 months: Single Family Home Lives with:: Spouse Patient language and need for interpreter reviewed:: Yes Do you feel safe going back to the place where you live?: Yes      Need for Family Participation in Patient Care: Yes (Comment) Care giver support system in place?: Yes (comment)   Criminal Activity/Legal Involvement Pertinent to Current Situation/Hospitalization: No - Comment as needed  Activities of Daily Living Home Assistive Devices/Equipment: Cane (specify quad or straight) ADL Screening (condition at time of admission) Patient's cognitive ability adequate to safely complete daily activities?: Yes Is the patient deaf or have difficulty hearing?: No Does the patient have difficulty seeing, even when wearing glasses/contacts?: No Does the patient have difficulty concentrating, remembering, or making  decisions?: No Patient able to express need for assistance with ADLs?: Yes Does the patient have difficulty dressing or bathing?: No Independently performs ADLs?: Yes (appropriate for developmental age) Does the patient have difficulty walking or climbing stairs?: Yes Weakness of Legs: Both Weakness of Arms/Hands: None  Permission Sought/Granted   Permission granted to share information with : Yes, Verbal Permission Granted  Share Information with NAME: Theodore Jenkins wife           Emotional Assessment Appearance:: Appears stated age Attitude/Demeanor/Rapport: Engaged Affect (typically observed): Accepting Orientation: : Oriented to Self, Oriented to Place, Oriented to  Time, Oriented to Situation Alcohol / Substance Use: Not Applicable Psych Involvement: No (comment)  Admission diagnosis:  Bacteremia [R78.81] Patient Active Problem List   Diagnosis Date Noted  . Hyponatremia 04/30/2020  . Bacteremia 04/29/2020  . S/P AVR (aortic valve replacement) 02/15/2018  . S/P aortic valve replacement with bioprosthetic valve  10/26/2017  . S/P CABG x 2 10/26/2017  . Type 2 diabetes mellitus with complication, without long-term current use of insulin ([place])   . Accelerating angina ([place]) 08/18/2017  . Carotid artery disease, unspecified laterality ([place]) 08/08/2016  . Exertional chest pain 10/03/2014  . Aortic valve stenosis 05/03/2014  . ED (erectile dysfunction) 03/20/2012  . Coronary artery disease involving native coronary artery of native heart without angina pectoris 06/06/2011  . Hyperlipidemia LDL goal <70 06/06/2011  . Essential hypertension 06/06/2011  . Obesities, morbid ([place]) 06/06/2011   PCP:  Karlene Einstein, MD  Pharmacy:   [place], [place] - [address] [place] [address] Phone: [phone] Fax: [place] [id number] - [place], [place] AT [place] [address] Phone: [phone] Fax: [phone]     Social Determinants of Health (SDOH) Interventions    Readmission Risk Interventions Readmission Risk Prevention Plan 04/30/2020  Post Dischage Appt Complete  Transportation Screening Complete  Some recent data might be hidden

## 2020-04-30 NOTE — Plan of Care (Signed)
  Problem: Pain Managment: Goal: General experience of comfort will improve Outcome: Progressing   

## 2020-04-30 NOTE — Progress Notes (Signed)
PHARMACY - PHYSICIAN COMMUNICATION CRITICAL VALUE ALERT - BLOOD CULTURE IDENTIFICATION (BCID)  Theodore [place] is an 74 y.o. male who presented to [facility] on 04/29/2020 with a chief complaint of fever and malaise.   Assessment:  WBC 9.1, LA 1.6>1, afebrile now on admission. BCID with 4/8 cultures showing strep species.    Name of physician (or Provider) Contacted: Dr Wyline Copas  Current antibiotics: Vancomycin   Changes to prescribed antibiotics recommended:  Adjust vancomycin to ceftriaxone 2g IV every 24 hours - ID to follow today  Results for orders placed or performed during the hospital encounter of 04/29/20  Blood Culture ID Panel (Reflexed) (Collected: 04/29/2020  5:49 PM)  Result Value Ref Range   Enterococcus species NOT DETECTED NOT DETECTED   Listeria monocytogenes NOT DETECTED NOT DETECTED   Staphylococcus species NOT DETECTED NOT DETECTED   Staphylococcus aureus (BCID) NOT DETECTED NOT DETECTED   Streptococcus species DETECTED (A) NOT DETECTED   Streptococcus agalactiae NOT DETECTED NOT DETECTED   Streptococcus pneumoniae NOT DETECTED NOT DETECTED   Streptococcus pyogenes NOT DETECTED NOT DETECTED   Acinetobacter baumannii NOT DETECTED NOT DETECTED   Enterobacteriaceae species NOT DETECTED NOT DETECTED   Enterobacter cloacae complex NOT DETECTED NOT DETECTED   Escherichia coli NOT DETECTED NOT DETECTED   Klebsiella oxytoca NOT DETECTED NOT DETECTED   Klebsiella pneumoniae NOT DETECTED NOT DETECTED   Proteus species NOT DETECTED NOT DETECTED   Serratia marcescens NOT DETECTED NOT DETECTED   Haemophilus influenzae NOT DETECTED NOT DETECTED   Neisseria meningitidis NOT DETECTED NOT DETECTED   Pseudomonas aeruginosa NOT DETECTED NOT DETECTED   Candida albicans NOT DETECTED NOT DETECTED   Candida glabrata NOT DETECTED NOT DETECTED   Candida krusei NOT DETECTED NOT DETECTED   Candida parapsilosis NOT DETECTED NOT DETECTED   Candida tropicalis NOT DETECTED NOT DETECTED    Antonietta Jewel, PharmD, BCCCP Clinical Pharmacist  04/30/2020 7:09 AM  Please check AMION for all [facility] phone numbers After 10:00 PM, call [place] [phone]

## 2020-04-30 NOTE — Progress Notes (Signed)
    [facility] has been requested to perform a transesophageal echocardiogram on Theodore [place] for bacteremia.  After careful review of history and examination, the risks and benefits of transesophageal echocardiogram have been explained including risks of esophageal damage, perforation (1:10,000 risk), bleeding, pharyngeal hematoma as well as other potential complications associated with conscious sedation including aspiration, arrhythmia, respiratory failure and death. Alternatives to treatment were discussed, questions were answered. Patient is willing to proceed.   Pt is scheduled for TEE tomorrow 05/01/20 at 9AM with Dr. Harrell Gave. NPO at [place] please.   Tami Lin Lenyx Boody, [place]  04/30/2020 3:51 PM

## 2020-04-30 NOTE — Progress Notes (Signed)
   04/30/20 0105  Vitals  Temp (!) 102 F (38.9 C)  BP 134/66  MAP (mmHg) 86  BP Location Left Arm  BP Method Automatic  Patient Position (if appropriate) Lying  Pulse Rate 81  Pulse Rate Source Monitor  Resp 15  Oxygen Therapy  SpO2 97 %  O2 Device Room Air  MEWS Score  MEWS Temp 2  MEWS Systolic 0  MEWS Pulse 0  MEWS RR 0  MEWS LOC 0  MEWS Score 2  MEWS Score Color Yellow  Provider Notification  Provider Name/Title M. Sharlet Salina  Date Provider Notified 04/30/20  Time Provider Notified [phone]  Notification Type Page  Notification Reason Other (Comment) (Temp 102)  Response No new orders  Note  Observations NAD

## 2020-04-30 NOTE — Progress Notes (Signed)
Pt resting. Will continue to monitor.

## 2020-04-30 NOTE — Progress Notes (Signed)
PROGRESS NOTE    Theodore [place]  [id number] DOB: Apr 06, 1946 DOA: 04/29/2020 PCP: Karlene Einstein, MD    Brief Narrative:  74 y.o. male with medical history significant of severe aortic stenosis status post bioprosthetic aortic valve replacement in 2018, CAD status post PCI and CABG, diabetes, hypertension, hyperlipidemia, GERD, obesity (BMI 33.79), OSA on CPAP presenting to the ED for evaluation of fevers and malaise.  He was sent to the ED by his PCP for IV antibiotics as recent blood cultures done 5/18 grew gram positive cocci. Echo done 5/19 negative for vegetation.   Patient states he has been having fever since the beginning of this month.  Also feeling tired.  He was initially treated with antibiotics and fevers resolved.  However, after finishing his antibiotic course, he started having fevers as high as 102 to 104 F again.  Denies cough, shortness of breath, chest pain, nausea, vomiting, abdominal pain, diarrhea, or dysuria.  ED Course: Febrile with temperature 102 F. Not tachycardic or hypotensive.  Labs showing no leukocytosis.  Lactic acid normal x2.  Sodium 128, mildly low on previous labs as well.  UA not suggestive of infection.  SARS-CoV-2 PCR test negative.  Chest x-ray not suggestive of pneumonia.  Assessment & Plan:   Principal Problem:   Bacteremia Active Problems:   Coronary artery disease involving native coronary artery of native heart without angina pectoris   Hyperlipidemia LDL goal <70   Essential hypertension   Hyponatremia   Bacteremia:  -Patient reports having fevers since the beginning of this month.   -Blood cultures done by PCP on 5/18 grew gram-positive cocci -Transthoracic echo done 5/19, reportedly negative for vegetation.  -Pt remains febrile but without leukocytosis, lactic acidosis, or signs of sepsis. -ID consulted and is following. Recommendation for discontinuing vanc and starting rocephin -Cardiology consulted for TEE, procedure planned for  5/21  Hyponatremia:  -Presenting sodium 128.   -Continue IVF as tolerated  Non-insulin-dependent type 2 diabetes:  -A1c 6.6.   -Continue sliding scale insulin sensitive ACHS and CBG checks.  OSA: Continue CPAP at night as tolerated  Hypertension: Stable.  Continue home lisinopril and metoprolol  Hyperlipidemia: Continue home Crestor  GERD: Continue home PPI  CAD: Stable.  Continue home aspirin, beta-blocker, and statin as toleratred  DVT prophylaxis: Lovenox subq Code Status: Full Family Communication: Pt in room, family at bedside  Status is: Inpatient  Remains inpatient appropriate because:Ongoing diagnostic testing needed not appropriate for outpatient work up and IV treatments appropriate due to intensity of illness or inability to take PO   Dispo: The patient is from: Home              Anticipated d/c is to: Home              Anticipated d/c date is: 3 days              Patient currently is not medically stable to d/c.   Consultants:   Cardiology  ID  Procedures:     Antimicrobials: Anti-infectives (From admission, onward)   Start     Dose/Rate Route Frequency Ordered Stop   04/30/20 1300  vancomycin (VANCOREADY) IVPB 1250 mg/250 mL  Status:  Discontinued     1,250 mg 166.7 mL/hr over 90 Minutes Intravenous Every 12 hours 04/29/20 2130 04/30/20 0710   04/30/20 0800  cefTRIAXone (ROCEPHIN) 2 g in sodium chloride 0.9 % 100 mL IVPB     2 g 200 mL/hr over 30 Minutes Intravenous Every  24 hours 04/30/20 0710     04/30/20 0100  vancomycin (VANCOREADY) IVPB 1500 mg/300 mL  Status:  Discontinued     1,500 mg 150 mL/hr over 120 Minutes Intravenous Every 12 hours 04/29/20 2112 04/29/20 2113   04/30/20 0100  vancomycin (VANCOREADY) IVPB 2000 mg/400 mL     2,000 mg 200 mL/hr over 120 Minutes Intravenous  Once 04/29/20 2130 04/30/20 0317       Subjective: Hoping to be able to go home soon  Objective: Vitals:   04/30/20 0305 04/30/20 0605 04/30/20  1005 04/30/20 1419  BP: 106/60 111/60 122/60 119/67  Pulse: 79 66 67 81  Resp: 15 14 18 18   Temp: 99.4 F (37.4 C) 98.2 F (36.8 C) 98.8 F (37.1 C) 98.6 F (37 C)  TempSrc: Oral Oral Oral Oral  SpO2: 98% 100% 98% 97%  Weight:      Height:        Intake/Output Summary (Last 24 hours) at 04/30/2020 1550 Last data filed at 04/30/2020 1217 Gross per 24 hour  Intake 1100 ml  Output 1500 ml  Net -400 ml   Filed Weights   04/29/20 2115  Weight: 99.3 kg    Examination:  General exam: Appears calm and comfortable  Respiratory system: Clear to auscultation. Respiratory effort normal. Cardiovascular system: S1 & S2 heard, Regular, systolic murmur Gastrointestinal system: Abdomen is nondistended, soft and nontender. No organomegaly or masses felt. Normal bowel sounds heard. Central nervous system: Alert and oriented. No focal neurological deficits. Extremities: Symmetric 5 x 5 power. Skin: No rashes, lesions Psychiatry: Judgement and insight appear normal. Mood & affect appropriate.   Data Reviewed: I have personally reviewed following labs and imaging studies  CBC: Recent Labs  Lab 04/29/20 1129  WBC 9.1  HGB 11.5*  HCT 35.4*  MCV 96.5  PLT 0000000   Basic Metabolic Panel: Recent Labs  Lab 04/29/20 1129  NA 128*  K 3.8  CL 99  CO2 20*  GLUCOSE 195*  BUN 17  CREATININE 1.19  CALCIUM 8.7*   GFR: Estimated Creatinine Clearance: 64.5 mL/min (by C-G formula based on SCr of 1.19 mg/dL). Liver Function Tests: Recent Labs  Lab 04/29/20 1129  AST 44*  ALT 37  ALKPHOS 58  BILITOT 0.6  PROT 7.4  ALBUMIN 3.4*   No results for input(s): LIPASE, AMYLASE in the last 168 hours. No results for input(s): AMMONIA in the last 168 hours. Coagulation Profile: No results for input(s): INR, PROTIME in the last 168 hours. Cardiac Enzymes: No results for input(s): CKTOTAL, CKMB, CKMBINDEX, TROPONINI in the last 168 hours. BNP (last 3 results) No results for input(s): PROBNP  in the last 8760 hours. HbA1C: Recent Labs    04/30/20 0425  HGBA1C 6.6*   CBG: Recent Labs  Lab 04/29/20 1932 04/30/20 0818 04/30/20 1216  GLUCAP 91 98 123*   Lipid Profile: No results for input(s): CHOL, HDL, LDLCALC, TRIG, CHOLHDL, LDLDIRECT in the last 72 hours. Thyroid Function Tests: No results for input(s): TSH, T4TOTAL, FREET4, T3FREE, THYROIDAB in the last 72 hours. Anemia Panel: No results for input(s): VITAMINB12, FOLATE, FERRITIN, TIBC, IRON, RETICCTPCT in the last 72 hours. Sepsis Labs: Recent Labs  Lab 04/29/20 1129 04/29/20 1749  LATICACIDVEN 1.6 1.0    Recent Results (from the past 240 hour(s))  Urine culture     Status: None   Collection Time: 04/29/20  5:11 PM   Specimen: Urine, Clean Catch  Result Value Ref Range Status   Specimen Description URINE,  CLEAN CATCH  Final   Special Requests NONE  Final   Culture   Final    NO GROWTH Performed at [facility], [place] [address], [place], [place] [address]    Report Status 04/30/2020 FINAL  Final  Culture, blood (routine x 2)     Status: None (Preliminary result)   Collection Time: 04/29/20  5:42 PM   Specimen: BLOOD LEFT FOREARM  Result Value Ref Range Status   Specimen Description BLOOD LEFT FOREARM  Final   Special Requests   Final    BOTTLES DRAWN AEROBIC AND ANAEROBIC Blood Culture adequate volume   Culture  Setup Time   Final    IN BOTH AEROBIC AND ANAEROBIC BOTTLES GRAM POSITIVE COCCI   Culture   Final    NO GROWTH < 24 HOURS Performed at [facility], [place] [address], [place], [place] [address]    Report Status PENDING  Incomplete  Culture, blood (routine x 2)     Status: None (Preliminary result)   Collection Time: 04/29/20  5:49 PM   Specimen: BLOOD  Result Value Ref Range Status   Specimen Description BLOOD RIGHT ANTECUBITAL  Final   Special Requests   Final    BOTTLES DRAWN AEROBIC AND ANAEROBIC Blood Culture adequate volume   Culture  Setup Time   Final    IN BOTH  AEROBIC AND ANAEROBIC BOTTLES GRAM POSITIVE COCCI CRITICAL RESULT CALLED TO, READ BACK BY AND VERIFIED WITH: Maryann Alar PT:[id number] [place] Performed at [facility], [place] [address], [place], [place] [address]    Culture GRAM POSITIVE COCCI  Final   Report Status PENDING  Incomplete  Blood Culture ID Panel (Reflexed)     Status: Abnormal   Collection Time: 04/29/20  5:49 PM  Result Value Ref Range Status   Enterococcus species NOT DETECTED NOT DETECTED Final   Listeria monocytogenes NOT DETECTED NOT DETECTED Final   Staphylococcus species NOT DETECTED NOT DETECTED Final   Staphylococcus aureus (BCID) NOT DETECTED NOT DETECTED Final   Streptococcus species DETECTED (A) NOT DETECTED Final    Comment: Not Enterococcus species, Streptococcus agalactiae, Streptococcus pyogenes, or Streptococcus pneumoniae. CRITICAL RESULT CALLED TO, READ BACK BY AND VERIFIED WITH: PHARMD Gorden Harms PT:[id number] [facility]    Streptococcus agalactiae NOT DETECTED NOT DETECTED Final   Streptococcus pneumoniae NOT DETECTED NOT DETECTED Final   Streptococcus pyogenes NOT DETECTED NOT DETECTED Final   Acinetobacter baumannii NOT DETECTED NOT DETECTED Final   Enterobacteriaceae species NOT DETECTED NOT DETECTED Final   Enterobacter cloacae complex NOT DETECTED NOT DETECTED Final   Escherichia coli NOT DETECTED NOT DETECTED Final   Klebsiella oxytoca NOT DETECTED NOT DETECTED Final   Klebsiella pneumoniae NOT DETECTED NOT DETECTED Final   Proteus species NOT DETECTED NOT DETECTED Final   Serratia marcescens NOT DETECTED NOT DETECTED Final   Haemophilus influenzae NOT DETECTED NOT DETECTED Final   Neisseria meningitidis NOT DETECTED NOT DETECTED Final   Pseudomonas aeruginosa NOT DETECTED NOT DETECTED Final   Candida albicans NOT DETECTED NOT DETECTED Final   Candida glabrata NOT DETECTED NOT DETECTED Final   Candida krusei NOT DETECTED NOT DETECTED Final   Candida parapsilosis NOT DETECTED NOT DETECTED Final    Candida tropicalis NOT DETECTED NOT DETECTED Final    Comment: Performed at [facility], [place] [address], [place],  [address]  SARS Coronavirus 2 by RT PCR (hospital order, performed in [facility] hospital lab) Nasopharyngeal Nasopharyngeal Swab  Status: None   Collection Time: 04/29/20  8:02 PM   Specimen: Nasopharyngeal Swab  Result Value Ref Range Status   SARS Coronavirus 2 NEGATIVE NEGATIVE Final    Comment: (NOTE) SARS-CoV-2 target nucleic acids are NOT DETECTED. The SARS-CoV-2 RNA is generally detectable in upper and lower respiratory specimens during the acute phase of infection. The lowest concentration of SARS-CoV-2 viral copies this assay can detect is 250 copies / mL. A negative result does not preclude SARS-CoV-2 infection and should not be used as the sole basis for treatment or other patient management decisions.  A negative result may occur with improper specimen collection / handling, submission of specimen other than nasopharyngeal swab, presence of viral mutation(s) within the areas targeted by this assay, and inadequate number of viral copies (<250 copies / mL). A negative result must be combined with clinical observations, patient history, and epidemiological information. Fact Sheet for Patients:   StrictlyIdeas.no Fact Sheet for Healthcare Providers: BankingDealers.co.za This test is not yet approved or cleared  by the [place] FDA and has been authorized for detection and/or diagnosis of SARS-CoV-2 by FDA under an Emergency Use Authorization (EUA).  This EUA will remain in effect (meaning this test can be used) for the duration of the COVID-19 declaration under Section 564(b)(1) of the Act, 21 U.S.C. section 360bbb-3(b)(1), unless the authorization is terminated or revoked sooner. Performed at [facility], [place] [address], [place], [place] [address]   Blood culture (routine x 2)      Status: None (Preliminary result)   Collection Time: 04/29/20 10:02 PM   Specimen: BLOOD LEFT HAND  Result Value Ref Range Status   Specimen Description BLOOD LEFT HAND  Final   Special Requests   Final    BOTTLES DRAWN AEROBIC AND ANAEROBIC Blood Culture adequate volume   Culture  Setup Time   Final    GRAM POSITIVE COCCI IN PAIRS IN CHAINS IN BOTH AEROBIC AND ANAEROBIC BOTTLES CRITICAL RESULT CALLED TO, READ BACK BY AND VERIFIED WITH: T BAUMEISTER,PHARMD AT [id number] 04/30/20 BY L BENFIELD Performed at [facility], [place] [address] [place], [place] [address]    Culture GRAM POSITIVE COCCI  Final   Report Status PENDING  Incomplete  Blood culture (routine x 2)     Status: None (Preliminary result)   Collection Time: 04/29/20 10:02 PM   Specimen: BLOOD  Result Value Ref Range Status   Specimen Description BLOOD LEFT ANTECUBITAL  Final   Special Requests   Final    BOTTLES DRAWN AEROBIC AND ANAEROBIC Blood Culture results may not be optimal due to an excessive volume of blood received in culture bottles   Culture  Setup Time   Final    GRAM POSITIVE COCCI IN PAIRS IN CHAINS IN BOTH AEROBIC AND ANAEROBIC BOTTLES CRITICAL VALUE NOTED.  VALUE IS CONSISTENT WITH PREVIOUSLY REPORTED AND CALLED VALUE. Performed at [facility], [place] [address] [place], [place] [address]    Culture GRAM POSITIVE COCCI  Final   Report Status PENDING  Incomplete     Radiology Studies: DG Chest 2 View  Result Date: 04/29/2020 CLINICAL DATA:  Cough and positive blood cultures EXAM: CHEST - 2 VIEW COMPARISON:  04/28/2020 FINDINGS: Postsurgical changes are again seen. Cardiac shadow is stable. The lungs are clear bilaterally. No acute bony abnormality is noted. IMPRESSION: No acute abnormality noted. Electronically Signed   By: Inez Catalina M.D.   On: 04/29/2020 16:52    Scheduled Meds: .  aspirin EC  81 mg Oral Daily  . enoxaparin (LOVENOX) injection  40 mg Subcutaneous Daily  . feeding supplement  (ENSURE ENLIVE)  237 mL Oral BID BM  . gabapentin  600 mg Oral Daily  . insulin aspart  0-5 Units Subcutaneous QHS  . insulin aspart  0-9 Units Subcutaneous TID WC  . lisinopril  5 mg Oral Daily  . loratadine  10 mg Oral Daily  . metoprolol succinate  50 mg Oral Daily  . pantoprazole  40 mg Oral Daily  . rosuvastatin  10 mg Oral Daily   Continuous Infusions: . sodium chloride 125 mL/hr at 04/30/20 0708  . cefTRIAXone (ROCEPHIN)  IV 2 g (04/30/20 1104)     LOS: 1 day   Marylu Lund, MD [facility] Pager On Amion  If 7PM-7AM, please contact night-coverage 04/30/2020, 3:50 PM

## 2020-04-30 NOTE — H&P (Signed)
History and Physical    Theodore Jenkins [id number] DOB: 1946-07-02 DOA: 04/29/2020  PCP: Karlene Einstein, MD Patient coming from: Home  Chief Complaint: Fever  HPI: Theodore Jenkins is a 74 y.o. male with medical history significant of severe aortic stenosis status post bioprosthetic aortic valve replacement in 2018, CAD status post PCI and CABG, diabetes, hypertension, hyperlipidemia, GERD, obesity (BMI 33.79), OSA on CPAP presenting to the ED for evaluation of fevers and malaise.  He was sent to the ED by his PCP for IV antibiotics as recent blood cultures done 5/18 grew gram positive cocci. Echo done 5/19 negative for vegetation.   Patient states he has been having fever since the beginning of this month.  Also feeling tired.  He was initially treated with antibiotics and fevers resolved.  However, after finishing his antibiotic course, he started having fevers as high as 102 to 104 F again.  Denies cough, shortness of breath, chest pain, nausea, vomiting, abdominal pain, diarrhea, or dysuria.  ED Course: Febrile with temperature 102 F. Not tachycardic or hypotensive.  Labs showing no leukocytosis.  Lactic acid normal x2.  Sodium 128, mildly low on previous labs as well.  UA not suggestive of infection.  SARS-CoV-2 PCR test negative.  Chest x-ray not suggestive of pneumonia.  ED provider discussed the case with Dr. Graylon Good who requested repeat blood cultures and advised starting the patient on vancomycin.  He will likely need TEE.  ID will consult in a.m.  Review of Systems:  All systems reviewed and apart from history of presenting illness, are negative.  Past Medical History:  Diagnosis Date  . Aortic stenosis    Echo (08/2017): Severe AS with mean gradient of 42 mmHg.  . Barrett's esophagus   . CAD (coronary artery disease) 06/06/2011   Remote stent to the LAD in 2005 // Last cath in 2010 showing diffuse CAD, small PD that is occluded and fills with left to right collaterals, 60-70% 1st OM,  with normal EF. Managed medically // Myoview 3/18:  Normal perfusion. LVEF 55% with normal wall motion. This is a low risk study.  . Complication of anesthesia   . Diabetes mellitus   . ED (erectile dysfunction)   . Family history of adverse reaction to anesthesia 10/23/2017   sister had history of malignant hyperthermia when she was 76, she is 72 years old now  . GERD (gastroesophageal reflux disease)   . Hyperlipidemia   . Hypertension   . IHD (ischemic heart disease)    prior stenting of the LAD in December of 2005; with last cath in 2010 showing diffuse CAD with normal LV function. He is managed medically.   . Malignant hyperthermia    sister had reaction concerning for malignant hyperthermia (~ 1988)  . Neuromuscular disorder ([place]) 10/23/2017   lumbar 4 disc in back causing nerve pain, on gabapentin  . Obesity   . Obstructive sleep apnea on CPAP   . PONV (postoperative nausea and vomiting)   . S/P aortic valve replacement with bioprosthetic valve 10/26/2017   25 mm [facility] Ease bovine pericardial tissue valve  . S/P coronary artery stent placement 2005   LAD    Past Surgical History:  Procedure Laterality Date  . AORTIC VALVE REPLACEMENT N/A 10/26/2017   Procedure: AORTIC VALVE REPLACEMENT (AVR), using Magna Ease 25;  Surgeon: Rexene Alberts, MD;  Location: [place];  Service: Open Heart Surgery;  Laterality: N/A;  . CARDIAC CATHETERIZATION  2010   NORMAL  LV FUNCTION. DIFFUSE CORONARY DISEASE WITH THE DISTAL PORTION OF THE SMALL POSTERIOR DESCENDING VESSEL OCCLUDED AND FILLING WITH THE RIGHT COLLATERALS, 60-70% STENOSIS IN THE FIRST OBTUSE MARGINAL VESSEL AND DIFFUSE SMALL VESSEL DISEASE  . CAROTID ENDARTERECTOMY    . CIRCUMCISION  1985  . CORONARY ARTERY BYPASS GRAFT N/A 10/26/2017   Procedure: CORONARY ARTERY BYPASS GRAFTING (CABG) x two , using left internal mammary artery and right leg greater saphenous vein harvested endoscopically;  Surgeon: Rexene Alberts, MD;   Location: [place];  Service: Open Heart Surgery;  Laterality: N/A;  . CORONARY STENT PLACEMENT  2005  . RIGHT/LEFT HEART CATH AND CORONARY ANGIOGRAPHY N/A 08/25/2017   Procedure: RIGHT/LEFT HEART CATH AND CORONARY ANGIOGRAPHY;  Surgeon: Nelva Bush, MD;  Location: [place] [facility];  Service: Cardiovascular;  Laterality: N/A;  . rotator cuff surgery Right   . TEE WITHOUT CARDIOVERSION N/A 10/26/2017   Procedure: TRANSESOPHAGEAL ECHOCARDIOGRAM (TEE);  Surgeon: Rexene Alberts, MD;  Location: [place];  Service: Open Heart Surgery;  Laterality: N/A;     reports that he quit smoking about 27 years ago. His smoking use included cigarettes. He has a 20.00 pack-year smoking history. He has never used smokeless tobacco. He reports current alcohol use of about 1.0 standard drinks of alcohol per week. He reports that he does not use drugs.  Allergies  Allergen Reactions  . Oxycodone Nausea And Vomiting    Family History  Problem Relation Age of Onset  . Cancer Mother   . Heart attack Father 59  . CAD Brother     Prior to Admission medications   Medication Sig Start Date End Date Taking? Authorizing Provider  acetaminophen (TYLENOL) 500 MG tablet Take 1,000 mg by mouth every 8 (eight) hours as needed for headache.   Yes [provider]  aspirin 81 MG EC tablet Take 1 tablet (81 mg total) by mouth daily. 03/19/18  Yes Rexene Alberts, MD  doxycycline (VIBRA-TABS) 100 MG tablet Take 100 mg by mouth 2 (two) times daily. Start date : 04/14/20 04/14/20  Yes [provider]  fluticasone (FLONASE) 50 MCG/ACT nasal spray Place 2 sprays into both nostrils daily as needed for allergies or rhinitis.    Yes [provider]  gabapentin (NEURONTIN) 300 MG capsule Take 600 mg by mouth daily.   Yes [provider]  lisinopril (ZESTRIL) 5 MG tablet Take 1 tablet (5 mg total) by mouth daily. 09/10/19  Yes Burnell Blanks, MD  loratadine (CLARITIN) 10 MG tablet Take 10 mg  by mouth daily.   Yes [provider]  metoprolol succinate (TOPROL-XL) 50 MG 24 hr tablet Take 50 mg by mouth daily. Take with or immediately following a meal.   Yes [provider]  Multiple Vitamins-Minerals (ONE-A-DAY 50 PLUS PO) Take 1 tablet by mouth daily.   Yes [provider]  nitroGLYCERIN (NITROSTAT) 0.4 MG SL tablet Place 1 tablet (0.4 mg total) under the tongue every 5 (five) minutes as needed for chest pain. 04/19/19 04/29/20 Yes Burnell Blanks, MD  omeprazole (PRILOSEC OTC) 20 MG tablet Take 20 mg by mouth daily.     Yes [provider]  rosuvastatin (CRESTOR) 10 MG tablet Take 10 mg by mouth daily.   Yes [provider]  sitaGLIPtin-metformin (JANUMET) 50-1000 MG tablet Take 1 tablet by mouth 2 (two) times daily with a meal.  12/19/16  Yes [provider]  ONETOUCH VERIO test strip TEST BS ONCE D 12/29/17  [provider]    Physical Exam: Vitals:   04/29/20 2115 04/29/20 2215 04/30/20 0105 04/30/20 0305  BP: 123/61 (!) 115/58 134/66 106/60  Pulse: 74 75 81 79  Resp: (!) 24 (!) 27 15 15   Temp:   (!) 102 F (38.9 C) 99.4 F (37.4 C)  TempSrc:    Oral  SpO2: 99% 98% 97% 98%  Weight: 99.3 kg     Height: 5' 7.5" (1.715 m)       Physical Exam  Constitutional: He is oriented to person, place, and time. He appears well-developed and well-nourished. No distress.  HENT:  Head: Normocephalic.  Eyes: Right eye exhibits no discharge. Left eye exhibits no discharge.  Cardiovascular: Normal rate, regular rhythm and intact distal pulses.  Pulmonary/Chest: Effort normal and breath sounds normal. No respiratory distress. He has no wheezes. He has no rales.  Abdominal: Soft. Bowel sounds are normal. He exhibits no distension. There is no abdominal tenderness. There is no guarding.  Musculoskeletal:        General: No edema.     Cervical back: Neck supple.  Neurological: He is alert and oriented to person, place, and  time.  Skin: Skin is warm and dry. He is not diaphoretic.    Labs on Admission: I have personally reviewed following labs and imaging studies  CBC: Recent Labs  Lab 04/29/20 1129  WBC 9.1  HGB 11.5*  HCT 35.4*  MCV 96.5  PLT 0000000   Basic Metabolic Panel: Recent Labs  Lab 04/29/20 1129  NA 128*  K 3.8  CL 99  CO2 20*  GLUCOSE 195*  BUN 17  CREATININE 1.19  CALCIUM 8.7*   GFR: Estimated Creatinine Clearance: 64.5 mL/min (by C-G formula based on SCr of 1.19 mg/dL). Liver Function Tests: Recent Labs  Lab 04/29/20 1129  AST 44*  ALT 37  ALKPHOS 58  BILITOT 0.6  PROT 7.4  ALBUMIN 3.4*   No results for input(s): LIPASE, AMYLASE in the last 168 hours. No results for input(s): AMMONIA in the last 168 hours. Coagulation Profile: No results for input(s): INR, PROTIME in the last 168 hours. Cardiac Enzymes: No results for input(s): CKTOTAL, CKMB, CKMBINDEX, TROPONINI in the last 168 hours. BNP (last 3 results) No results for input(s): PROBNP in the last 8760 hours. HbA1C: Recent Labs    04/30/20 0425  HGBA1C 6.6*   CBG: Recent Labs  Lab 04/29/20 1932  GLUCAP 91   Lipid Profile: No results for input(s): CHOL, HDL, LDLCALC, TRIG, CHOLHDL, LDLDIRECT in the last 72 hours. Thyroid Function Tests: No results for input(s): TSH, T4TOTAL, FREET4, T3FREE, THYROIDAB in the last 72 hours. Anemia Panel: No results for input(s): VITAMINB12, FOLATE, FERRITIN, TIBC, IRON, RETICCTPCT in the last 72 hours. Urine analysis:    Component Value Date/Time   COLORURINE YELLOW 04/29/2020 1710   APPEARANCEUR CLEAR 04/29/2020 1710   LABSPEC 1.015 04/29/2020 1710   PHURINE 5.0 04/29/2020 1710   GLUCOSEU NEGATIVE 04/29/2020 1710   HGBUR NEGATIVE 04/29/2020 [place] 04/29/2020 1710   [place] 04/29/2020 1710   PROTEINUR NEGATIVE 04/29/2020 1710   NITRITE NEGATIVE 04/29/2020 1710   LEUKOCYTESUR NEGATIVE 04/29/2020 1710    Radiological Exams on  Admission: DG Chest 2 View  Result Date: 04/29/2020 CLINICAL DATA:  Cough and positive blood cultures EXAM: CHEST - 2 VIEW COMPARISON:  04/28/2020 FINDINGS: Postsurgical changes are again seen. Cardiac shadow is stable. The lungs are clear bilaterally. No acute bony abnormality is noted. IMPRESSION: No  acute abnormality noted. Electronically Signed   By: Inez Catalina M.D.   On: 04/29/2020 16:52    Assessment/Plan Principal Problem:   Bacteremia Active Problems:   Coronary artery disease involving native coronary artery of native heart without angina pectoris   Hyperlipidemia LDL goal <70   Essential hypertension   Hyponatremia   Bacteremia: Patient reports having fevers since the beginning of this month.  Blood cultures done by PCP on 5/18 grew gram-positive cocci, no speciation results available.  Transthoracic echo done 5/19 negative for vegetation.  Febrile here.  No leukocytosis, lactic acidosis, or signs of sepsis.  Dr. Graylon Good recommended repeat blood cultures and starting the patient on vancomycin.  ID will consult in a.m. -Continue vancomycin.  Repeat blood cultures pending.  Will likely need TEE to rule out endocarditis as he has a bioprosthetic aortic valve.  Consult cardiology in a.m.  Hyponatremia: Sodium 128.  Suspect due to decreased p.o. intake in the setting of ongoing illness. -IV fluid hydration and continue to monitor BMP  Non-insulin-dependent type 2 diabetes: Check A1c.  Sliding scale insulin sensitive ACHS and CBG checks.  OSA: Continue CPAP at night  Hypertension: Stable.  Continue home lisinopril and metoprolol  Hyperlipidemia: Continue home Crestor  GERD: Continue home PPI  CAD: Stable.  Continue home aspirin, beta-blocker, and statin.  DVT prophylaxis: Lovenox Code Status: Patient wishes to be full code. Family Communication: No family available at this time. Disposition Plan: Status is: Inpatient  Remains inpatient appropriate because:IV treatments  appropriate due to intensity of illness or inability to take PO   Dispo: The patient is from: Home              Anticipated d/c is to: Home              Anticipated d/c date is: > 3 days              Patient currently is not medically stable to d/c.  The medical decision making on this patient was of high complexity and the patient is at high risk for clinical deterioration, therefore this is a level 3 visit.  Shela Leff MD [facility]  If 7PM-7AM, please contact night-coverage [email or web address]  04/30/2020, 5:50 AM

## 2020-05-01 ENCOUNTER — Inpatient Hospital Stay (HOSPITAL_COMMUNITY): Payer: PPO

## 2020-05-01 ENCOUNTER — Inpatient Hospital Stay (HOSPITAL_COMMUNITY): Payer: PPO | Admitting: Anesthesiology

## 2020-05-01 ENCOUNTER — Encounter (HOSPITAL_COMMUNITY)
Admission: EM | Disposition: A | Discharge status: Home Health | Payer: Self-pay | Source: Home / Self Care | Attending: Internal Medicine

## 2020-05-01 ENCOUNTER — Encounter (HOSPITAL_COMMUNITY): Payer: Self-pay | Admitting: Internal Medicine

## 2020-05-01 DIAGNOSIS — R7881 Bacteremia: Secondary | ICD-10-CM

## 2020-05-01 DIAGNOSIS — E785 Hyperlipidemia, unspecified: Secondary | ICD-10-CM

## 2020-05-01 DIAGNOSIS — I10 Essential (primary) hypertension: Secondary | ICD-10-CM

## 2020-05-01 DIAGNOSIS — I330 Acute and subacute infective endocarditis: Secondary | ICD-10-CM

## 2020-05-01 HISTORY — PX: TEE WITHOUT CARDIOVERSION: SHX5443

## 2020-05-01 LAB — COMPREHENSIVE METABOLIC PANEL
ALT: 42 U/L (ref 0–44)
AST: 41 U/L (ref 15–41)
Albumin: 3 g/dL — ABNORMAL LOW (ref 3.5–5.0)
Alkaline Phosphatase: 56 U/L (ref 38–126)
Anion gap: 8 (ref 5–15)
BUN: 13 mg/dL (ref 8–23)
CO2: 20 mmol/L — ABNORMAL LOW (ref 22–32)
Calcium: 8.5 mg/dL — ABNORMAL LOW (ref 8.9–10.3)
Chloride: 104 mmol/L (ref 98–111)
Creatinine, Ser: 0.86 mg/dL (ref 0.61–1.24)
GFR calc Af Amer: >60 mL/min (ref >60)
GFR calc non Af Amer: >60 mL/min (ref >60)
Glucose, Bld: 118 mg/dL — ABNORMAL HIGH (ref 70–99)
Potassium: 4.1 mmol/L (ref 3.5–5.1)
Sodium: 132 mmol/L — ABNORMAL LOW (ref 135–145)
Total Bilirubin: 0.5 mg/dL (ref 0.3–1.2)
Total Protein: 6.4 g/dL — ABNORMAL LOW (ref 6.5–8.1)

## 2020-05-01 LAB — CBC
HCT: 32.5 % — ABNORMAL LOW (ref 39.0–52.0)
Hemoglobin: 10.8 g/dL — ABNORMAL LOW (ref 13.0–17.0)
MCH: 31.8 pg (ref 26.0–34.0)
MCHC: 33.2 g/dL (ref 30.0–36.0)
MCV: 95.6 fL (ref 80.0–100.0)
Platelets: 167 10*3/uL (ref 150–400)
RBC: 3.4 MIL/uL — ABNORMAL LOW (ref 4.22–5.81)
RDW: 13.6 % (ref 11.5–15.5)
WBC: 6.6 10*3/uL (ref 4.0–10.5)
nRBC: 0 % (ref 0.0–0.2)

## 2020-05-01 LAB — GLUCOSE, CAPILLARY
Glucose-Capillary: 119 mg/dL — ABNORMAL HIGH (ref 70–99)
Glucose-Capillary: 101 mg/dL — ABNORMAL HIGH (ref 70–99)
Glucose-Capillary: 163 mg/dL — ABNORMAL HIGH (ref 70–99)
Glucose-Capillary: 127 mg/dL — ABNORMAL HIGH (ref 70–99)

## 2020-05-01 SURGERY — ECHOCARDIOGRAM, TRANSESOPHAGEAL
Anesthesia: Monitor Anesthesia Care

## 2020-05-01 MED ORDER — LIDOCAINE 2% (20 MG/ML) 5 ML SYRINGE
INTRAMUSCULAR | Status: DC | PRN
  Administered 2020-05-01: 40 mg via INTRAVENOUS

## 2020-05-01 MED ORDER — PROPOFOL 500 MG/50ML IV EMUL
INTRAVENOUS | Status: DC | PRN
  Administered 2020-05-01: 100 ug/kg/min via INTRAVENOUS

## 2020-05-01 MED ORDER — GENTAMICIN SULFATE 40 MG/ML IJ SOLN
3.0000 mg/kg | INTRAVENOUS | Status: DC
  Administered 2020-05-01 – 2020-05-04 (×4): 240 mg via INTRAVENOUS
  Filled 2020-05-01 (×4): qty 6

## 2020-05-01 MED ORDER — EPHEDRINE SULFATE-NACL 50-0.9 MG/10ML-% IV SOSY
PREFILLED_SYRINGE | INTRAVENOUS | Status: DC | PRN
  Administered 2020-05-01: 10 mg via INTRAVENOUS

## 2020-05-01 MED ORDER — SODIUM CHLORIDE 0.9 % IV SOLN: INTRAVENOUS | Status: DC

## 2020-05-01 MED ORDER — OXYCODONE HCL 5 MG PO TABS: 5.0000 mg | ORAL_TABLET | ORAL | Status: DC | PRN

## 2020-05-01 MED ORDER — PROPOFOL 10 MG/ML IV BOLUS
INTRAVENOUS | Status: DC | PRN
  Administered 2020-05-01 (×3): 20 mg via INTRAVENOUS
  Administered 2020-05-01: 10 mg via INTRAVENOUS

## 2020-05-01 MED ORDER — TRAMADOL HCL 50 MG PO TABS
50.0000 mg | ORAL_TABLET | Freq: Four times a day (QID) | ORAL | Status: DC | PRN
  Administered 2020-05-01 – 2020-05-04 (×8): 50 mg via ORAL
  Filled 2020-05-01 (×8): qty 1

## 2020-05-01 NOTE — Progress Notes (Signed)
[place] for Infectious Disease  Date of Admission:  04/29/2020     Total days of antibiotics: 3   Day 2: Ceftriaxone Day 1: Gentamicin   ASSESSMENT: Theodore Jenkins is a 74 y.o. male with a PMHx of bioprosthetic valve replacement (2018), CAD s/p CABG, T2DM, HTN, HLD who presents to the ED with c/o month-long fevers and subsequently admitted for bacteremia based on cultures obtained at PCP's office (Gram positive cocci).   Culture speciation positive for strep infantarius. This is concerning for possible colo-rectal translocation and is associated with colo-rectal malignancy. Previous CT A/P from 2018 did not show any evidence of mass or wall thickening. Patient cannot recall the date of his last colonoscopy but states he is likely due soon. Denies hx of any polypectomies. Emphasized importance of scheduling appointment with GI very soon.   TEE obtained this AM and results show thickening of one bioprosthetic leaflet, confirming endocarditis of prosthetic valve. He will need prolonged IV antibiotic. Plan to continue Ceftriaxone at this time, pending sensitivities, but may be able to transition to penicillin. He will need Gentamicin for 2 weeks as well, discussed with him side effect profile.   PLAN: 1. Continue Ceftriaxone  2. Add Gentamicin  3. Sensitivities pending; will follow up 4. Repeat cultures x2 5. Once cultures show clearance, will need PICC placement   Principal Problem:   Bacteremia Active Problems:   Coronary artery disease involving native coronary artery of native heart without angina pectoris   Hyperlipidemia LDL goal <70   Essential hypertension   Hyponatremia   Acute bacterial endocarditis   . aspirin EC  81 mg Oral Daily  . enoxaparin (LOVENOX) injection  40 mg Subcutaneous Daily  . gabapentin  600 mg Oral Daily  . insulin aspart  0-5 Units Subcutaneous QHS  . insulin aspart  0-9 Units Subcutaneous TID WC  . lisinopril  5 mg Oral Daily  .  loratadine  10 mg Oral Daily  . metoprolol succinate  50 mg Oral Daily  . pantoprazole  40 mg Oral Daily  . rosuvastatin  10 mg Oral Daily    SUBJECTIVE: Theodore Jenkins states he is feeling okay this AM. He had his TEE and states he tolerated the procedure without complications.   We reviewed the results, including leaflet thickening concerning for endocarditis. Discussed plan for extended IV antibiotics. All questions and concerns addressed.   Review of Systems: Negative except as noted above.   Allergies  Allergen Reactions  . Oxycodone Nausea And Vomiting    OBJECTIVE: Vitals:   05/01/20 0850 05/01/20 0900 05/01/20 0910 05/01/20 0936  BP: (!) 117/52 (!) 141/57 (!) 148/58 131/61  Pulse: 66 71 71 65  Resp: 17 16 (!) 21 18  Temp: 97.7 F (36.5 C)   97.7 F (36.5 C)  TempSrc: Axillary   Oral  SpO2: 96% 97% 98% 100%  Weight:      Height:       Body mass index is 33.79 kg/m.  Physical Exam Vitals and nursing note reviewed.  Constitutional:      General: He is not in acute distress.    Appearance: He is obese.  HENT:     Head: Normocephalic and atraumatic.  Cardiovascular:     Rate and Rhythm: Normal rate and regular rhythm.     Heart sounds: Murmur (holosystolic murmur) present.  Pulmonary:     Effort: Pulmonary effort is normal. No respiratory distress.  Abdominal:  General: There is no distension.     Palpations: Abdomen is soft.     Tenderness: There is no abdominal tenderness. There is no guarding.  Musculoskeletal:     Right lower leg: No edema.     Left lower leg: No edema.  Skin:    General: Skin is warm and dry.  Neurological:     General: No focal deficit present.     Mental Status: He is alert and oriented to person, place, and time.  Psychiatric:        Mood and Affect: Mood normal.        Behavior: Behavior normal.     Lab Results Lab Results  Component Value Date   WBC 6.6 05/01/2020   HGB 10.8 (L) 05/01/2020   HCT 32.5 (L) 05/01/2020    MCV 95.6 05/01/2020   PLT 167 05/01/2020    Lab Results  Component Value Date   CREATININE 0.86 05/01/2020   BUN 13 05/01/2020   NA 132 (L) 05/01/2020   K 4.1 05/01/2020   CL 104 05/01/2020   CO2 20 (L) 05/01/2020    Lab Results  Component Value Date   ALT 42 05/01/2020   AST 41 05/01/2020   ALKPHOS 56 05/01/2020   BILITOT 0.5 05/01/2020     Microbiology: Recent Results (from the past 240 hour(s))  Urine culture     Status: None   Collection Time: 04/29/20  5:11 PM   Specimen: Urine, Clean Catch  Result Value Ref Range Status   Specimen Description URINE, CLEAN CATCH  Final   Special Requests NONE  Final   Culture   Final    NO GROWTH Performed at [facility], [address], [place], [place] [address]    Report Status 04/30/2020 FINAL  Final  Culture, blood (routine x 2)     Status: None (Preliminary result)   Collection Time: 04/29/20  5:42 PM   Specimen: BLOOD LEFT FOREARM  Result Value Ref Range Status   Specimen Description BLOOD LEFT FOREARM  Final   Special Requests   Final    BOTTLES DRAWN AEROBIC AND ANAEROBIC Blood Culture adequate volume   Culture  Setup Time   Final    IN BOTH AEROBIC AND ANAEROBIC BOTTLES GRAM POSITIVE COCCI CRITICAL VALUE NOTED.  VALUE IS CONSISTENT WITH PREVIOUSLY REPORTED AND CALLED VALUE.    Culture   Final    GRAM POSITIVE COCCI IDENTIFICATION TO FOLLOW Performed at [facility], [place] [address], [place], [place] [address]    Report Status PENDING  Incomplete  Culture, blood (routine x 2)     Status: Abnormal (Preliminary result)   Collection Time: 04/29/20  5:49 PM   Specimen: BLOOD  Result Value Ref Range Status   Specimen Description BLOOD RIGHT ANTECUBITAL  Final   Special Requests   Final    BOTTLES DRAWN AEROBIC AND ANAEROBIC Blood Culture adequate volume   Culture  Setup Time   Final    IN BOTH AEROBIC AND ANAEROBIC BOTTLES GRAM POSITIVE COCCI CRITICAL RESULT CALLED TO, READ BACK BY AND VERIFIED WITH:  PHARMD Gorden Harms PT:[id number] FCP    Culture (A)  Final    STREPTOCOCCUS INFANTARIUS SUSCEPTIBILITIES TO FOLLOW Performed at [facility], [place] [address], [place], [place] [address]    Report Status PENDING  Incomplete  Blood Culture ID Panel (Reflexed)     Status: Abnormal   Collection Time: 04/29/20  5:49 PM  Result Value Ref  Range Status   Enterococcus species NOT DETECTED NOT DETECTED Final   Listeria monocytogenes NOT DETECTED NOT DETECTED Final   Staphylococcus species NOT DETECTED NOT DETECTED Final   Staphylococcus aureus (BCID) NOT DETECTED NOT DETECTED Final   Streptococcus species DETECTED (A) NOT DETECTED Final    Comment: Not Enterococcus species, Streptococcus agalactiae, Streptococcus pyogenes, or Streptococcus pneumoniae. CRITICAL RESULT CALLED TO, READ BACK BY AND VERIFIED WITH: PHARMD Gorden Harms PT:[id number] FCP    Streptococcus agalactiae NOT DETECTED NOT DETECTED Final   Streptococcus pneumoniae NOT DETECTED NOT DETECTED Final   Streptococcus pyogenes NOT DETECTED NOT DETECTED Final   Acinetobacter baumannii NOT DETECTED NOT DETECTED Final   Enterobacteriaceae species NOT DETECTED NOT DETECTED Final   Enterobacter cloacae complex NOT DETECTED NOT DETECTED Final   Escherichia coli NOT DETECTED NOT DETECTED Final   Klebsiella oxytoca NOT DETECTED NOT DETECTED Final   Klebsiella pneumoniae NOT DETECTED NOT DETECTED Final   Proteus species NOT DETECTED NOT DETECTED Final   Serratia marcescens NOT DETECTED NOT DETECTED Final   Haemophilus influenzae NOT DETECTED NOT DETECTED Final   Neisseria meningitidis NOT DETECTED NOT DETECTED Final   Pseudomonas aeruginosa NOT DETECTED NOT DETECTED Final   Candida albicans NOT DETECTED NOT DETECTED Final   Candida glabrata NOT DETECTED NOT DETECTED Final   Candida krusei NOT DETECTED NOT DETECTED Final   Candida parapsilosis NOT DETECTED NOT DETECTED Final   Candida tropicalis NOT DETECTED NOT DETECTED Final    Comment:  Performed at [facility], [place] [address], [place], [place] [address]  SARS Coronavirus 2 by RT PCR (hospital order, performed in [facility] hospital lab) Nasopharyngeal Nasopharyngeal Swab     Status: None   Collection Time: 04/29/20  8:02 PM   Specimen: Nasopharyngeal Swab  Result Value Ref Range Status   SARS Coronavirus 2 NEGATIVE NEGATIVE Final    Comment: (NOTE) SARS-CoV-2 target nucleic acids are NOT DETECTED. The SARS-CoV-2 RNA is generally detectable in upper and lower respiratory specimens during the acute phase of infection. The lowest concentration of SARS-CoV-2 viral copies this assay can detect is 250 copies / mL. A negative result does not preclude SARS-CoV-2 infection and should not be used as the sole basis for treatment or other patient management decisions.  A negative result may occur with improper specimen collection / handling, submission of specimen other than nasopharyngeal swab, presence of viral mutation(s) within the areas targeted by this assay, and inadequate number of viral copies (<250 copies / mL). A negative result must be combined with clinical observations, patient history, and epidemiological information. Fact Sheet for Patients:   StrictlyIdeas.no Fact Sheet for Healthcare Providers: BankingDealers.co.za This test is not yet approved or cleared  by the [place] FDA and has been authorized for detection and/or diagnosis of SARS-CoV-2 by FDA under an Emergency Use Authorization (EUA).  This EUA will remain in effect (meaning this test can be used) for the duration of the COVID-19 declaration under Section 564(b)(1) of the Act, 21 U.S.C. section 360bbb-3(b)(1), unless the authorization is terminated or revoked sooner. Performed at [facility], [place] [address] [place], [place] [address]   Blood culture (routine x 2)     Status: None (Preliminary result)   Collection Time: 04/29/20 10:02  PM   Specimen: BLOOD LEFT HAND  Result Value Ref Range Status   Specimen Description BLOOD LEFT HAND  Final   Special Requests   Final    BOTTLES DRAWN AEROBIC AND ANAEROBIC Blood  Culture adequate volume   Culture  Setup Time   Final    GRAM POSITIVE COCCI IN PAIRS IN CHAINS IN BOTH AEROBIC AND ANAEROBIC BOTTLES CRITICAL RESULT CALLED TO, READ BACK BY AND VERIFIED WITH: T BAUMEISTER,PHARMD AT 0946 04/30/20 BY L BENFIELD    Culture   Final    GRAM POSITIVE COCCI IDENTIFICATION TO FOLLOW Performed at [facility], [place] [address] [place], [place] [address]    Report Status PENDING  Incomplete  Blood culture (routine x 2)     Status: Abnormal (Preliminary result)   Collection Time: 04/29/20 10:02 PM   Specimen: BLOOD  Result Value Ref Range Status   Specimen Description BLOOD LEFT ANTECUBITAL  Final   Special Requests   Final    BOTTLES DRAWN AEROBIC AND ANAEROBIC Blood Culture results may not be optimal due to an excessive volume of blood received in culture bottles   Culture  Setup Time   Final    GRAM POSITIVE COCCI IN PAIRS IN CHAINS IN BOTH AEROBIC AND ANAEROBIC BOTTLES CRITICAL VALUE NOTED.  VALUE IS CONSISTENT WITH PREVIOUSLY REPORTED AND CALLED VALUE. Performed at [facility], [place] [address] [place], [place] [address]    Culture STREPTOCOCCUS INFANTARIUS (A)  Final   Report Status PENDING  Incomplete   Dr. Jose Persia Internal Medicine PGY-1  05/01/2020, 9:54 AM

## 2020-05-01 NOTE — TOC Progression Note (Addendum)
Transition of Care [facility]) - Progression Note    Patient Details  Name: Theodore Jenkins MRN: [id number] Date of Birth: 1946-01-14  Transition of Care [facility]) CM/SW Contact  Jacalyn Lefevre Edson Snowball, RN Phone Number: 05/01/2020, 4:01 PM  Clinical Narrative:     Damaris Schooner to patient and wife Caren Griffins at bedside. Discussed home with IV ABX. Explained Infusion company will be [facility]. Pam from [facility] will be in contact to arrange teaching with patient and wife before patient is discharged. Patient will have a [place] however Dustin will not be present everytime a dose is due. Patient and wife both voiced understanding.    [place] , [place] , [place] , [place],  [facility], [facility] , [place] [facility] all unable to accept referral for [facility].   [place] with [facility] checking to see if she can accept referral. [place] unable to accept referral.   Juliann Pulse with [facility] unable to accept referral.   [facility] unable to accept referral.   Sharee Pimple with [facility] checking and will call back.[facility] unable to take   Expected Discharge Plan: Home/Self Care Barriers to Discharge: Continued Medical Work up  Expected Discharge Plan and Services Expected Discharge Plan: Home/Self Care   Discharge Planning Services: CM Consult   Living arrangements for the past 2 months: Single Family Home                   DME Agency: NA       HH Arranged: NA           Social Determinants of Health (SDOH) Interventions    Readmission Risk Interventions Readmission Risk Prevention Plan 04/30/2020  Post Dischage Appt Complete  Transportation Screening Complete  Some recent data might be hidden

## 2020-05-01 NOTE — Transfer of Care (Signed)
Immediate Anesthesia Transfer of Care Note  Patient: Theodore Jenkins  Procedure(s) Performed: TRANSESOPHAGEAL ECHOCARDIOGRAM (TEE) (N/A )  Patient Location: Endoscopy Unit  Anesthesia Type:MAC  Level of Consciousness: drowsy  Airway & Oxygen Therapy: Patient Spontanous Breathing  Post-op Assessment: Report given to RN  Post vital signs: Reviewed and stable  Last Vitals:  Vitals Value Taken Time  BP    Temp    Pulse 69 05/01/20 0847  Resp 18 05/01/20 0847  SpO2 92 % 05/01/20 0847  Vitals shown include unvalidated device data.  Last Pain:  Vitals:   05/01/20 0745  TempSrc: Oral  PainSc: 6       Patients Stated Pain Goal: 2 (15/05/69 7948)  Complications: No apparent anesthesia complications

## 2020-05-01 NOTE — Progress Notes (Signed)
Water added to CPAP chamber, pt to notify when he is ready to go on CPAP for the night. RT will continue to monitor.

## 2020-05-01 NOTE — Anesthesia Preprocedure Evaluation (Addendum)
Anesthesia Evaluation  Patient identified by MRN, date of birth, ID band Patient awake  General Assessment Comment:Youngest sister had documented MH episode. Other siblings tested negative ( muscle biopsy, halothane contraction ). Patient has never been tested. He has undergone GA without problems.  Reviewed: Allergy & Precautions, NPO status , Patient's Chart, lab work & pertinent test results, reviewed documented beta blocker date and time   History of Anesthesia Complications (+) PONV, MALIGNANT HYPERTHERMIA, Family history of anesthesia reaction and history of anesthetic complications  Airway Mallampati: III  TM Distance: >3 FB Neck ROM: Full    Dental no notable dental hx. (+) Teeth Intact, Caps,  All upper teeth implants. :   Pulmonary sleep apnea and Continuous Positive Airway Pressure Ventilation , former smoker,    Pulmonary exam normal breath sounds clear to auscultation       Cardiovascular hypertension, Pt. on home beta blockers and Pt. on medications + angina with exertion + CAD, + Cardiac Stents and + CABG  Normal cardiovascular exam+ Valvular Problems/Murmurs AS  Rhythm:Regular Rate:Normal  S/P AVR with bioprosthetic AV S/P PCI and CABG EKG - Sinus Bradycardia,  1st degree AV block, incomplete LBBB   Neuro/Psych  Neuromuscular disease negative psych ROS   GI/Hepatic Neg liver ROS, GERD  Medicated and Controlled,Hx/o Barrett's esophagus   Endo/Other  diabetes, Well Controlled, Type 2, Oral Hypoglycemic AgentsObesity Hyperlipidemia  Renal/GU negative Renal ROS  negative genitourinary   Musculoskeletal negative musculoskeletal ROS (+)   Abdominal (+) + obese,   Peds  Hematology Bacteremia and FUO   Anesthesia Other Findings   Reproductive/Obstetrics                           Anesthesia Physical Anesthesia Plan  ASA: III  Anesthesia Plan: MAC   Post-op Pain Management:     Induction: Intravenous  PONV Risk Score and Plan: 2 and Ondansetron, Treatment may vary due to age or medical condition and Propofol infusion  Airway Management Planned: Natural Airway and Nasal Cannula  Additional Equipment:   Intra-op Plan:   Post-operative Plan:   Informed Consent: I have reviewed the patients History and Physical, chart, labs and discussed the procedure including the risks, benefits and alternatives for the proposed anesthesia with the patient or authorized representative who has indicated his/her understanding and acceptance.     Dental advisory given  Plan Discussed with: CRNA and Surgeon  Anesthesia Plan Comments:         Anesthesia Quick Evaluation

## 2020-05-01 NOTE — CV Procedure (Signed)
    TRANSESOPHAGEAL ECHOCARDIOGRAM   NAME:  Theodore Jenkins   MRN: [id number] DOB:  1946/10/04   ADMIT DATE: 04/29/2020  INDICATIONS: Bacteremia  PROCEDURE:   Informed consent was obtained prior to the procedure. The risks, benefits and alternatives for the procedure were discussed and the patient comprehended these risks.  Risks include, but are not limited to, cough, sore throat, vomiting, nausea, somnolence, esophageal and stomach trauma or perforation, bleeding, low blood pressure, aspiration, pneumonia, infection, trauma to the teeth and death.    Procedural time out performed. Patient received monitored anesthesia care under the supervision of Dr. Royce Macadamia.   The transesophageal probe was inserted in the esophagus and stomach without difficulty and multiple views were obtained.    COMPLICATIONS:    There were no immediate complications.  FINDINGS:  LEFT VENTRICLE: EF = 55-60%. No regional wall motion abnormalities.  RIGHT VENTRICLE: Normal size and function.   LEFT ATRIUM: No thrombus/mass.  LEFT ATRIAL APPENDAGE: No thrombus/mass.   RIGHT ATRIUM: No thrombus. Prominent Chiari network.  AORTIC VALVE:  Bioprosthetic aortic valve. No central regurgitation, trivial perivalvular regurgitation. There is significant thickening of one biosprosthetic leaflet, suggestive of endocarditis. No visualized perivalvular abscess.  MITRAL VALVE:    Normal structure. Trivial regurgitation. No vegetation.  TRICUSPID VALVE: Normal structure. Trivial regurgitation. No vegetation.  PULMONIC VALVE: Not well visualized.  INTERATRIAL SEPTUM: No PFO or ASD seen by color Doppler.  PERICARDIUM: No effusion noted.  DESCENDING AORTA: Moderate diffuse plaque seen with focal protruding atheroma seen in descending aorta.   CONCLUSION: Endocarditis of the bioprosthetic aortic valve. No large mobile mass or significant regurgitation, but prominent thickening of a single bioprosthetic  leaflet.   Buford Dresser, MD, PhD [facility]  [address], [place], [place] [address] [phone]   8:49 AM

## 2020-05-01 NOTE — Interval H&P Note (Signed)
History and Physical Interval Note:  05/01/2020 8:01 AM  Theodore Jenkins  has presented today for surgery, with the diagnosis of BACTEREMIA.  The various methods of treatment have been discussed with the patient and family. After consideration of risks, benefits and other options for treatment, the patient has consented to  Procedure(s): TRANSESOPHAGEAL ECHOCARDIOGRAM (TEE) (N/A) as a surgical intervention.  The patient's history has been reviewed, patient examined, no change in status, stable for surgery.  I have reviewed the patient's chart and labs.  Questions were answered to the patient's satisfaction.     Bridgette Harrell Gave

## 2020-05-01 NOTE — Anesthesia Procedure Notes (Signed)
Procedure Name: MAC Date/Time: 05/01/2020 8:08 AM Performed by: Barrington Ellison, CRNA Pre-anesthesia Checklist: Patient identified, Emergency Drugs available, Suction available, Timeout performed and Patient being monitored Patient Re-evaluated:Patient Re-evaluated prior to induction Oxygen Delivery Method: Nasal cannula

## 2020-05-01 NOTE — Anesthesia Postprocedure Evaluation (Signed)
Anesthesia Post Note  Patient: Theodore Jenkins  Procedure(s) Performed: TRANSESOPHAGEAL ECHOCARDIOGRAM (TEE) (N/A )     Patient location during evaluation: PACU Anesthesia Type: MAC Level of consciousness: awake and alert Pain management: pain level controlled Vital Signs Assessment: post-procedure vital signs reviewed and stable Respiratory status: spontaneous breathing, nonlabored ventilation and respiratory function stable Cardiovascular status: stable and blood pressure returned to baseline Postop Assessment: no apparent nausea or vomiting Anesthetic complications: no    Last Vitals:  Vitals:   05/01/20 0745 05/01/20 0850  BP: (!) 154/62 (!) 117/52  Pulse: 67 66  Resp: 17 17  Temp: 37.1 C 36.5 C  SpO2: 98% 96%    Last Pain:  Vitals:   05/01/20 0850  TempSrc: Axillary  PainSc: 0-No pain                 Hager Compston A.

## 2020-05-01 NOTE — Progress Notes (Signed)
Pharmacy Antibiotic Note  Theodore Jenkins is a 74 y.o. male admitted on 04/29/2020 with prosthetic atrial valve endocarditis. Pharmacy has been consulted for gentamicin dosing.  Plan: Start Gentamicin 3 mg/kg IV q24h Will continue to monitor renal function and obtain trough level in 4-5 days.  Height: 5' 7.5" (171.5 cm) Weight: 99.3 kg (219 lb) IBW/kg (Calculated) : 67.25  Temp (24hrs), Avg:98.3 F (36.8 C), Min:97.7 F (36.5 C), Max:98.8 F (37.1 C)  Recent Labs  Lab 04/29/20 1129 04/29/20 1749 05/01/20 0232  WBC 9.1  --  6.6  CREATININE 1.19  --  0.86  LATICACIDVEN 1.6 1.0  --     Estimated Creatinine Clearance: 89.3 mL/min (by C-G formula based on SCr of 0.86 mg/dL).    Allergies  Allergen Reactions  . Oxycodone Nausea And Vomiting   Vancomycin 5/19>5/20 Ceftriaxone 5/19>>  Gentamicin 5/21>>  5/19 Blood cx: Streptococcus infantarius 5/19 Blood cx: GPC 5/21 Blood cx: pending 5/21 Blood cx: pending  Thank you for allowing pharmacy to be a part of this patient's care.  Yolanda Bonine, PharmD 05/01/2020 1:54 PM

## 2020-05-01 NOTE — Progress Notes (Signed)
  Echocardiogram Echocardiogram Transesophageal has been performed.  Jennette Dubin 05/01/2020, 8:53 AM

## 2020-05-01 NOTE — Progress Notes (Signed)
PROGRESS NOTE    Theodore Jenkins  [id number] DOB: [birth year] DOA: 04/29/2020 PCP: Karlene Einstein, MD    Brief Narrative:  74 y.o. male with medical history significant of severe aortic stenosis status post bioprosthetic aortic valve replacement in 2018, CAD status post PCI and CABG, diabetes, hypertension, hyperlipidemia, GERD, obesity (BMI 33.79), OSA on CPAP presenting to the ED for evaluation of fevers and malaise.  He was sent to the ED by his PCP for IV antibiotics as recent blood cultures done 5/18 grew gram positive cocci. Echo done 5/19 negative for vegetation.   Patient states he has been having fever since the beginning of this month.  Also feeling tired.  He was initially treated with antibiotics and fevers resolved.  However, after finishing his antibiotic course, he started having fevers as high as 102 to 104 F again.  Denies cough, shortness of breath, chest pain, nausea, vomiting, abdominal pain, diarrhea, or dysuria.  ED Course: Febrile with temperature 102 F. Not tachycardic or hypotensive.  Labs showing no leukocytosis.  Lactic acid normal x2.  Sodium 128, mildly low on previous labs as well.  UA not suggestive of infection.  SARS-CoV-2 PCR test negative.  Chest x-ray not suggestive of pneumonia.  Assessment & Plan:   Principal Problem:   Bacteremia Active Problems:   Coronary artery disease involving native coronary artery of native heart without angina pectoris   Hyperlipidemia LDL goal <70   Essential hypertension   Hyponatremia   Acute bacterial endocarditis   Bacteremia:  -Patient reports having fevers since the beginning of this month.   -Blood cultures done by PCP on 5/18 grew gram-positive cocci. Blood cultures now pos for Strep infantarius. -Transthoracic echo done 5/19, reportedly negative for vegetation.  -Follow up TEE performed on 5/21 confirms significant thickening of one bioprosthetic leaflet, suggestive of endocarditis -ID recommendation for  continued rocephin with added gentamicin -Repeat blood cx, plan to place PICC after repeat blood cx neg -No longer febrile. No leukocytosis  Hyponatremia:  -Presenting sodium 128.   -Improved with IVF  Non-insulin-dependent type 2 diabetes:  -A1c 6.6.   -Continue sliding scale insulin sensitive ACHS and CBG checks as needed  OSA: Continue CPAP at night as tolerated  Hypertension: Stable.  Continue home lisinopril and metoprolol  Hyperlipidemia: Continue home Crestor  GERD: Continue home PPI as tolerated  CAD: Stable.  Continue home aspirin, beta-blocker, and statin as toleratred  DVT prophylaxis: Lovenox subq Code Status: Full Family Communication: Pt in room, family at bedside  Status is: Inpatient  Remains inpatient appropriate because:Ongoing diagnostic testing needed not appropriate for outpatient work up and IV treatments appropriate due to intensity of illness or inability to take PO   Dispo: The patient is from: Home              Anticipated d/c is to: Home              Anticipated d/c date is: 3 days              Patient currently is not medically stable to d/c.   Consultants:   Cardiology  ID  Procedures:     Antimicrobials: Anti-infectives (From admission, onward)   Start     Dose/Rate Route Frequency Ordered Stop   05/01/20 1130  gentamicin (GARAMYCIN) 240 mg in dextrose 5 % 100 mL IVPB     3 mg/kg  80.1 kg (Adjusted) 106 mL/hr Jenkins 60 Minutes Intravenous Every 24 hours 05/01/20 1055  04/30/20 1300  vancomycin (VANCOREADY) IVPB 1250 mg/250 mL  Status:  Discontinued     1,250 mg 166.7 mL/hr Jenkins 90 Minutes Intravenous Every 12 hours 04/29/20 2130 04/30/20 0710   04/30/20 0800  cefTRIAXone (ROCEPHIN) 2 g in sodium chloride 0.9 % 100 mL IVPB     2 g 200 mL/hr Jenkins 30 Minutes Intravenous Every 24 hours 04/30/20 0710     04/30/20 0100  vancomycin (VANCOREADY) IVPB 1500 mg/300 mL  Status:  Discontinued     1,500 mg 150 mL/hr Jenkins 120  Minutes Intravenous Every 12 hours 04/29/20 2112 04/29/20 2113   04/30/20 0100  vancomycin (VANCOREADY) IVPB 2000 mg/400 mL     2,000 mg 200 mL/hr Jenkins 120 Minutes Intravenous  Once 04/29/20 2130 04/30/20 0317      Subjective: Eager to go home soon  Objective: Vitals:   05/01/20 0900 05/01/20 0910 05/01/20 0936 05/01/20 1426  BP: (!) 141/57 (!) 148/58 131/61 (!) 114/57  Pulse: 71 71 65 69  Resp: 16 (!) 21 18 18   Temp:   97.7 F (36.5 C) 97.7 F (36.5 C)  TempSrc:   Oral Oral  SpO2: 97% 98% 100% 100%  Weight:      Height:        Intake/Output Summary (Last 24 hours) at 05/01/2020 1703 Last data filed at 05/01/2020 1645 Gross per 24 hour  Intake 1176.44 ml  Output 2100 ml  Net -923.56 ml   Filed Weights   04/29/20 2115  Weight: 99.3 kg    Examination: General exam: Awake, laying in bed, in nad Respiratory system: Normal respiratory effort, no wheezing Cardiovascular system: regular rate, s1, s2 Gastrointestinal system: Soft, nondistended, positive BS Central nervous system: CN2-12 grossly intact, strength intact Extremities: Perfused, no clubbing Skin: Normal skin turgor, no notable skin lesions seen Psychiatry: Mood normal // no visual hallucinations   Data Reviewed: I have personally reviewed following labs and imaging studies  CBC: Recent Labs  Lab 04/29/20 1129 05/01/20 0232  WBC 9.1 6.6  HGB 11.5* 10.8*  HCT 35.4* 32.5*  MCV 96.5 95.6  PLT 162 [id number]   Basic Metabolic Panel: Recent Labs  Lab 04/29/20 1129 05/01/20 0232  NA 128* 132*  K 3.8 4.1  CL 99 104  CO2 20* 20*  GLUCOSE 195* 118*  BUN 17 13  CREATININE 1.19 0.86  CALCIUM 8.7* 8.5*   GFR: Estimated Creatinine Clearance: 89.3 mL/min (by C-G formula based on SCr of 0.86 mg/dL). Liver Function Tests: Recent Labs  Lab 04/29/20 1129 05/01/20 0232  AST 44* 41  ALT 37 42  ALKPHOS 58 56  BILITOT 0.6 0.5  PROT 7.4 6.4*  ALBUMIN 3.4* 3.0*   No results for input(s): LIPASE, AMYLASE in  the last 168 hours. No results for input(s): AMMONIA in the last 168 hours. Coagulation Profile: No results for input(s): INR, PROTIME in the last 168 hours. Cardiac Enzymes: No results for input(s): CKTOTAL, CKMB, CKMBINDEX, TROPONINI in the last 168 hours. BNP (last 3 results) No results for input(s): PROBNP in the last 8760 hours. HbA1C: Recent Labs    04/30/20 0425  HGBA1C 6.6*   CBG: Recent Labs  Lab 04/30/20 1745 04/30/20 2126 05/01/20 0931 05/01/20 1159 05/01/20 1628  GLUCAP 98 125* 119* 101* 163*   Lipid Profile: No results for input(s): CHOL, HDL, LDLCALC, TRIG, CHOLHDL, LDLDIRECT in the last 72 hours. Thyroid Function Tests: No results for input(s): TSH, T4TOTAL, FREET4, T3FREE, THYROIDAB in the last 72 hours. Anemia Panel: No results for  input(s): VITAMINB12, FOLATE, FERRITIN, TIBC, IRON, RETICCTPCT in the last 72 hours. Sepsis Labs: Recent Labs  Lab 04/29/20 1129 04/29/20 1749  LATICACIDVEN 1.6 1.0    Recent Results (from the past 240 hour(s))  Urine culture     Status: None   Collection Time: 04/29/20  5:11 PM   Specimen: Urine, Clean Catch  Result Value Ref Range Status   Specimen Description URINE, CLEAN CATCH  Final   Special Requests NONE  Final   Culture   Final    NO GROWTH Performed at [facility], [place] [address] [place], [place] [address]    Report Status 04/30/2020 FINAL  Final  Culture, blood (routine x 2)     Status: None (Preliminary result)   Collection Time: 04/29/20  5:42 PM   Specimen: BLOOD LEFT FOREARM  Result Value Ref Range Status   Specimen Description BLOOD LEFT FOREARM  Final   Special Requests   Final    BOTTLES DRAWN AEROBIC AND ANAEROBIC Blood Culture adequate volume   Culture  Setup Time   Final    IN BOTH AEROBIC AND ANAEROBIC BOTTLES GRAM POSITIVE COCCI CRITICAL VALUE NOTED.  VALUE IS CONSISTENT WITH PREVIOUSLY REPORTED AND CALLED VALUE.    Culture   Final    GRAM POSITIVE COCCI IDENTIFICATION TO  FOLLOW Performed at [facility], [place] [address] [place], [place] [address]    Report Status PENDING  Incomplete  Culture, blood (routine x 2)     Status: Abnormal (Preliminary result)   Collection Time: 04/29/20  5:49 PM   Specimen: BLOOD  Result Value Ref Range Status   Specimen Description BLOOD RIGHT ANTECUBITAL  Final   Special Requests   Final    BOTTLES DRAWN AEROBIC AND ANAEROBIC Blood Culture adequate volume   Culture  Setup Time   Final    IN BOTH AEROBIC AND ANAEROBIC BOTTLES GRAM POSITIVE COCCI CRITICAL RESULT CALLED TO, READ BACK BY AND VERIFIED WITH: PHARMD Gorden Harms PT:[id number] FCP    Culture (A)  Final    STREPTOCOCCUS INFANTARIUS SUSCEPTIBILITIES TO FOLLOW Performed at [facility], [place] [address] [place], [place] [address]    Report Status PENDING  Incomplete  Blood Culture ID Panel (Reflexed)     Status: Abnormal   Collection Time: 04/29/20  5:49 PM  Result Value Ref Range Status   Enterococcus species NOT DETECTED NOT DETECTED Final   Listeria monocytogenes NOT DETECTED NOT DETECTED Final   Staphylococcus species NOT DETECTED NOT DETECTED Final   Staphylococcus aureus (BCID) NOT DETECTED NOT DETECTED Final   Streptococcus species DETECTED (A) NOT DETECTED Final    Comment: Not Enterococcus species, Streptococcus agalactiae, Streptococcus pyogenes, or Streptococcus pneumoniae. CRITICAL RESULT CALLED TO, READ BACK BY AND VERIFIED WITH: PHARMD Gorden Harms PT:[id number] FCP    Streptococcus agalactiae NOT DETECTED NOT DETECTED Final   Streptococcus pneumoniae NOT DETECTED NOT DETECTED Final   Streptococcus pyogenes NOT DETECTED NOT DETECTED Final   Acinetobacter baumannii NOT DETECTED NOT DETECTED Final   Enterobacteriaceae species NOT DETECTED NOT DETECTED Final   Enterobacter cloacae complex NOT DETECTED NOT DETECTED Final   Escherichia coli NOT DETECTED NOT DETECTED Final   Klebsiella oxytoca NOT DETECTED NOT DETECTED Final   Klebsiella pneumoniae  NOT DETECTED NOT DETECTED Final   Proteus species NOT DETECTED NOT DETECTED Final   Serratia marcescens NOT DETECTED NOT DETECTED Final   Haemophilus influenzae NOT DETECTED NOT DETECTED Final   Neisseria meningitidis NOT DETECTED  NOT DETECTED Final   Pseudomonas aeruginosa NOT DETECTED NOT DETECTED Final   Candida albicans NOT DETECTED NOT DETECTED Final   Candida glabrata NOT DETECTED NOT DETECTED Final   Candida krusei NOT DETECTED NOT DETECTED Final   Candida parapsilosis NOT DETECTED NOT DETECTED Final   Candida tropicalis NOT DETECTED NOT DETECTED Final    Comment: Performed at [facility], [place] [address], [place], [place] [address]  SARS Coronavirus 2 by RT PCR (hospital order, performed in [facility] hospital lab) Nasopharyngeal Nasopharyngeal Swab     Status: None   Collection Time: 04/29/20  8:02 PM   Specimen: Nasopharyngeal Swab  Result Value Ref Range Status   SARS Coronavirus 2 NEGATIVE NEGATIVE Final    Comment: (NOTE) SARS-CoV-2 target nucleic acids are NOT DETECTED. The SARS-CoV-2 RNA is generally detectable in upper and lower respiratory specimens during the acute phase of infection. The lowest concentration of SARS-CoV-2 viral copies this assay can detect is 250 copies / mL. A negative result does not preclude SARS-CoV-2 infection and should not be used as the sole basis for treatment or other patient management decisions.  A negative result may occur with improper specimen collection / handling, submission of specimen other than nasopharyngeal swab, presence of viral mutation(s) within the areas targeted by this assay, and inadequate number of viral copies (<250 copies / mL). A negative result must be combined with clinical observations, patient history, and epidemiological information. Fact Sheet for Patients:   StrictlyIdeas.no Fact Sheet for Healthcare Providers: BankingDealers.co.za This test is not yet  approved or cleared  by the [place] FDA and has been authorized for detection and/or diagnosis of SARS-CoV-2 by FDA under an Emergency Use Authorization (EUA).  This EUA will remain in effect (meaning this test can be used) for the duration of the COVID-19 declaration under Section 564(b)(1) of the Act, 21 U.S.C. section 360bbb-3(b)(1), unless the authorization is terminated or revoked sooner. Performed at [facility], [place] [address], [place], [place] [address]   Blood culture (routine x 2)     Status: None (Preliminary result)   Collection Time: 04/29/20 10:02 PM   Specimen: BLOOD LEFT HAND  Result Value Ref Range Status   Specimen Description BLOOD LEFT HAND  Final   Special Requests   Final    BOTTLES DRAWN AEROBIC AND ANAEROBIC Blood Culture adequate volume   Culture  Setup Time   Final    GRAM POSITIVE COCCI IN PAIRS IN CHAINS IN BOTH AEROBIC AND ANAEROBIC BOTTLES CRITICAL RESULT CALLED TO, READ BACK BY AND VERIFIED WITH: T BAUMEISTER,PHARMD AT [place] 04/30/20 BY L BENFIELD    Culture   Final    GRAM POSITIVE COCCI IDENTIFICATION TO FOLLOW Performed at [facility], [place] [address] [place], [place] [address]    Report Status PENDING  Incomplete  Blood culture (routine x 2)     Status: Abnormal (Preliminary result)   Collection Time: 04/29/20 10:02 PM   Specimen: BLOOD  Result Value Ref Range Status   Specimen Description BLOOD LEFT ANTECUBITAL  Final   Special Requests   Final    BOTTLES DRAWN AEROBIC AND ANAEROBIC Blood Culture results may not be optimal due to an excessive volume of blood received in culture bottles   Culture  Setup Time   Final    GRAM POSITIVE COCCI IN PAIRS IN CHAINS IN BOTH AEROBIC AND ANAEROBIC BOTTLES CRITICAL VALUE NOTED.  VALUE IS CONSISTENT WITH PREVIOUSLY REPORTED AND CALLED VALUE. Performed  at [facility], [place] [address], [place], [place] [address]    Culture STREPTOCOCCUS INFANTARIUS (A)  Final   Report Status  PENDING  Incomplete  Culture, blood (routine x 2)     Status: None (Preliminary result)   Collection Time: 05/01/20 10:42 AM   Specimen: BLOOD RIGHT HAND  Result Value Ref Range Status   Specimen Description BLOOD RIGHT HAND  Final   Special Requests   Final    BOTTLES DRAWN AEROBIC ONLY Blood Culture results may not be optimal due to an inadequate volume of blood received in culture bottles Performed at [place] [address] [place],  [address]    Culture PENDING  Incomplete   Report Status PENDING  Incomplete     Radiology Studies: ECHO TEE  Result Date: 05/01/2020    TRANSESOPHOGEAL ECHO REPORT   Patient Name:   DRAGON STAGG [facility] Date of Exam: 05/01/2020 Medical Rec #:  [id number]   Height:       67.5 in Accession #:    [id number]  Weight:       219.0 lb Date of Birth:  1946/10/28  BSA:          2.113 m Patient Age:    18 years    BP:           154/62 mmHg Patient Gender: M           HR:           67 bpm. Exam Location:  Inpatient Procedure: Transesophageal Echo, Color Doppler, Cardiac Doppler and 3D Echo Indications:     Bacteremia  History:         Patient has prior history of Echocardiogram examinations, most                  recent 05/30/2018. CAD; Risk Factors:Hypertension, Diabetes and                  Dyslipidemia.                  Aortic Valve: 25 mm Edwards Magna Ease Bovine Pericardial                  Tissue valve is present in the aortic position. Procedure Date:                  10/26/2017.  Sonographer:     Mikki Santee RDCS (AE) Referring Phys:  [id number] Tami Lin [facility] Diagnosing Phys: Buford Dresser MD PROCEDURE: After discussion of the risks and benefits of a TEE, an informed consent was obtained. The transesophogeal probe was passed without difficulty through the esophogus of the patient. Sedation performed by different physician. The patient was monitored while under deep sedation. Anesthestetic sedation was provided intravenously by Anesthesiology:  377.83mg  of Propofol, 40mg  of Lidocaine. The patient developed no complications during the procedure. IMPRESSIONS  1. Left ventricular ejection fraction, by estimation, is 55 to 60%. The left ventricle has normal function. The left ventricle has no regional wall motion abnormalities.  2. Right ventricular systolic function is normal. The right ventricular size is normal.  3. No left atrial/left atrial appendage thrombus was detected.  4. The mitral valve is normal in structure. Trivial mitral valve regurgitation. No evidence of mitral stenosis.  5. No central regurgitation, trivial perivalvular regurgitation. There is significant thickening of one biosprosthetic leaflet, suggestive of endocarditis. No visualized perivalvular abscess. The aortic valve has been repaired/replaced. Aortic valve regurgitation is  trivial. There is a 25 mm Edwards Magna Ease Bovine Pericardial Tissue valve present in the aortic position. Procedure Date: 10/26/2017. Echo findings are consistent with thrombus/mass of the aortic prosthesis. Aortic valve mean gradient  measures 16.0 mmHg.  6. Pulmonic valve regurgitation not well visualized.  7. Aortic S/P bioprosthetic AVR. There is Severe (Grade IV) protruding and atheroma plaque involving the descending aorta. Conclusion(s)/Recommendation(s): Endocarditis of the bioprosthetic aortic valve. No large independently mobile mass or significant central regurgitation, but prominent thickening of a single bioprosthetic leaflet, best visualized in image 80. FINDINGS  Left Ventricle: Left ventricular ejection fraction, by estimation, is 55 to 60%. The left ventricle has normal function. The left ventricle has no regional wall motion abnormalities. The left ventricular internal cavity size was normal in size. There is  no left ventricular hypertrophy. Right Ventricle: The right ventricular size is normal. No increase in right ventricular wall thickness. Right ventricular systolic function is normal.  Left Atrium: Left atrial size was not assessed. No left atrial/left atrial appendage thrombus was detected. Right Atrium: Right atrial size was not assessed. Prominent Chiari network. Pericardium: There is no evidence of pericardial effusion. Mitral Valve: The mitral valve is normal in structure. Trivial mitral valve regurgitation. No evidence of mitral valve stenosis. There is no evidence of mitral valve vegetation. Tricuspid Valve: The tricuspid valve is normal in structure. Tricuspid valve regurgitation is trivial. There is no evidence of tricuspid valve vegetation. Aortic Valve: No central regurgitation, trivial perivalvular regurgitation. There is significant thickening of one biosprosthetic leaflet, suggestive of endocarditis. No visualized perivalvular abscess. The aortic valve has been repaired/replaced. Aortic  valve regurgitation is trivial. Aortic valve mean gradient measures 16.0 mmHg. Aortic valve peak gradient measures 32.0 mmHg. There is a 25 mm Edwards Magna Ease Bovine Pericardial Tissue valve present in the aortic position. Procedure Date: 10/26/2017. Pulmonic Valve: Shadow from aortic valve makes evaluation of PV difficult. The pulmonic valve was not well visualized. Pulmonic valve regurgitation not well visualized. Aorta: S/P bioprosthetic AVR. There is severe (Grade IV) protruding and atheroma plaque involving the descending aorta. IAS/Shunts: No atrial level shunt detected by color flow Doppler.  AORTIC VALVE AV Vmax:           283.00 cm/s AV Vmean:          191.000 cm/s AV VTI:            0.591 m AV Peak Grad:      32.0 mmHg AV Mean Grad:      16.0 mmHg LVOT Vmax:         177.00 cm/s LVOT Vmean:        123.000 cm/s LVOT VTI:          0.353 m LVOT/AV VTI ratio: 0.60 TRICUSPID VALVE TR Peak grad:   16.8 mmHg TR Vmax:        205.00 cm/s  SHUNTS Systemic VTI: 0.35 m Buford Dresser MD Electronically signed by Buford Dresser MD Signature Date/Time: 05/01/2020/11:43:02 AM    Final      Scheduled Meds: . aspirin EC  81 mg Oral Daily  . enoxaparin (LOVENOX) injection  40 mg Subcutaneous Daily  . gabapentin  600 mg Oral Daily  . insulin aspart  0-5 Units Subcutaneous QHS  . insulin aspart  0-9 Units Subcutaneous TID WC  . lisinopril  5 mg Oral Daily  . loratadine  10 mg Oral Daily  . metoprolol succinate  50 mg Oral Daily  . pantoprazole  40 mg Oral Daily  .  rosuvastatin  10 mg Oral Daily   Continuous Infusions: . cefTRIAXone (ROCEPHIN)  IV 2 g (05/01/20 1038)  . gentamicin 240 mg (05/01/20 1233)     LOS: 2 days   Marylu Lund, MD [facility] Pager On Amion  If 7PM-7AM, please contact night-coverage 05/01/2020, 5:03 PM

## 2020-05-02 DIAGNOSIS — I2510 Atherosclerotic heart disease of native coronary artery without angina pectoris: Secondary | ICD-10-CM

## 2020-05-02 LAB — CBC WITH DIFFERENTIAL/PLATELET
Abs Immature Granulocytes: 0.08 K/uL — ABNORMAL HIGH (ref 0.00–0.07)
Basophils Absolute: 0 K/uL (ref 0.0–0.1)
Basophils Relative: 0 %
Eosinophils Absolute: 0 K/uL (ref 0.0–0.5)
Eosinophils Relative: 0 %
HCT: 33.5 % — ABNORMAL LOW (ref 39.0–52.0)
Hemoglobin: 11 g/dL — ABNORMAL LOW (ref 13.0–17.0)
Immature Granulocytes: 1 %
Lymphocytes Relative: 10 %
Lymphs Abs: 0.7 K/uL (ref 0.7–4.0)
MCH: 31.3 pg (ref 26.0–34.0)
MCHC: 32.8 g/dL (ref 30.0–36.0)
MCV: 95.4 fL (ref 80.0–100.0)
Monocytes Absolute: 1.1 K/uL — ABNORMAL HIGH (ref 0.1–1.0)
Monocytes Relative: 16 %
Neutro Abs: 5.3 K/uL (ref 1.7–7.7)
Neutrophils Relative %: 73 %
Platelets: 191 K/uL (ref 150–400)
RBC: 3.51 MIL/uL — ABNORMAL LOW (ref 4.22–5.81)
RDW: 13.7 % (ref 11.5–15.5)
WBC: 7.3 K/uL (ref 4.0–10.5)
nRBC: 0 % (ref 0.0–0.2)

## 2020-05-02 LAB — CULTURE, BLOOD (ROUTINE X 2)
Special Requests: ADEQUATE
Special Requests: ADEQUATE
Special Requests: ADEQUATE

## 2020-05-02 LAB — COMPREHENSIVE METABOLIC PANEL WITH GFR
ALT: 36 U/L (ref 0–44)
AST: 32 U/L (ref 15–41)
Albumin: 3.1 g/dL — ABNORMAL LOW (ref 3.5–5.0)
Alkaline Phosphatase: 62 U/L (ref 38–126)
Anion gap: 8 (ref 5–15)
BUN: 11 mg/dL (ref 8–23)
CO2: 23 mmol/L (ref 22–32)
Calcium: 8.9 mg/dL (ref 8.9–10.3)
Chloride: 101 mmol/L (ref 98–111)
Creatinine, Ser: 0.94 mg/dL (ref 0.61–1.24)
GFR calc Af Amer: >60 mL/min
GFR calc non Af Amer: >60 mL/min
Glucose, Bld: 115 mg/dL — ABNORMAL HIGH (ref 70–99)
Potassium: 4 mmol/L (ref 3.5–5.1)
Sodium: 132 mmol/L — ABNORMAL LOW (ref 135–145)
Total Bilirubin: 0.9 mg/dL (ref 0.3–1.2)
Total Protein: 6.9 g/dL (ref 6.5–8.1)

## 2020-05-02 LAB — GLUCOSE, CAPILLARY
Glucose-Capillary: 107 mg/dL — ABNORMAL HIGH (ref 70–99)
Glucose-Capillary: 140 mg/dL — ABNORMAL HIGH (ref 70–99)
Glucose-Capillary: 228 mg/dL — ABNORMAL HIGH (ref 70–99)
Glucose-Capillary: 146 mg/dL — ABNORMAL HIGH (ref 70–99)

## 2020-05-02 MED ORDER — POLYETHYLENE GLYCOL 3350 17 G PO PACK
17.0000 g | PACK | Freq: Every day | ORAL | Status: DC
  Administered 2020-05-02 – 2020-05-03 (×2): 17 g via ORAL
  Filled 2020-05-02 (×2): qty 1

## 2020-05-02 MED ORDER — CYCLOBENZAPRINE HCL 5 MG PO TABS
5.0000 mg | ORAL_TABLET | Freq: Three times a day (TID) | ORAL | Status: DC | PRN
  Administered 2020-05-02 – 2020-05-04 (×6): 5 mg via ORAL
  Filled 2020-05-02 (×6): qty 1

## 2020-05-02 MED ORDER — BISACODYL 5 MG PO TBEC
5.0000 mg | DELAYED_RELEASE_TABLET | Freq: Every day | ORAL | Status: DC | PRN
  Administered 2020-05-02 – 2020-05-03 (×2): 5 mg via ORAL
  Filled 2020-05-02 (×2): qty 1

## 2020-05-02 NOTE — Progress Notes (Signed)
Pt able to place himself on CPAP when ready for bed. Advised pt to notify for RT if any further assistance is needed.

## 2020-05-02 NOTE — Progress Notes (Signed)
PROGRESS NOTE    Theodore Jenkins  [id number] DOB: 1946/01/24 DOA: 04/29/2020 PCP: Karlene Einstein, MD    Brief Narrative:  75 y.o. male with medical history significant of severe aortic stenosis status post bioprosthetic aortic valve replacement in 2018, CAD status post PCI and CABG, diabetes, hypertension, hyperlipidemia, GERD, obesity (BMI 33.79), OSA on CPAP presenting to the ED for evaluation of fevers and malaise.  He was sent to the ED by his PCP for IV antibiotics as recent blood cultures done 5/18 grew gram positive cocci. Echo done 5/19 negative for vegetation.   Patient states he has been having fever since the beginning of this month.  Also feeling tired.  He was initially treated with antibiotics and fevers resolved.  However, after finishing his antibiotic course, he started having fevers as high as 102 to 104 F again.  Denies cough, shortness of breath, chest pain, nausea, vomiting, abdominal pain, diarrhea, or dysuria.  ED Course: Febrile with temperature 102 F. Not tachycardic or hypotensive.  Labs showing no leukocytosis.  Lactic acid normal x2.  Sodium 128, mildly low on previous labs as well.  UA not suggestive of infection.  SARS-CoV-2 PCR test negative.  Chest x-ray not suggestive of pneumonia.  Assessment & Plan:   Principal Problem:   Bacteremia Active Problems:   Coronary artery disease involving native coronary artery of native heart without angina pectoris   Hyperlipidemia LDL goal <70   Essential hypertension   Hyponatremia   Acute bacterial endocarditis   Bacteremia:  -Patient reports having fevers since the beginning of this month.   -Blood cultures done by PCP on 5/18 grew gram-positive cocci. Blood cultures now pos for Strep infantarius. -Transthoracic echo done 5/19, reportedly negative for vegetation.  -Follow up TEE performed on 5/21 confirms significant thickening of one bioprosthetic leaflet, suggestive of endocarditis -ID recommendation for  continued rocephin with added gentamicin -Repeat blood cx, plan to place PICC after repeat blood cx neg x 48hrs -No longer febrile. Without leukocytosis  Hyponatremia:  -Presenting sodium 128.   -Improved with IVF and stable currently  Non-insulin-dependent type 2 diabetes:  -A1c 6.6.   -Continue sliding scale insulin sensitive ACHS and CBG checks as needed  OSA: Continue CPAP at night as pt tolerates  Hypertension: Stable.  Continue home lisinopril and metoprolol as tolerated  Hyperlipidemia: Continue home Crestor  GERD: Continue home PPI as tolerated  CAD: Stable.  Continue home aspirin, beta-blocker, and statin as toleratred  DVT prophylaxis: Lovenox subq Code Status: Full Family Communication: Pt in room, family at bedside  Status is: Inpatient  Remains inpatient appropriate because:Ongoing diagnostic testing needed not appropriate for outpatient work up and IV treatments appropriate due to intensity of illness or inability to take PO   Dispo: The patient is from: Home              Anticipated d/c is to: Home              Anticipated d/c date is: 3 days              Patient currently is not medically stable to d/c.   Consultants:   Cardiology  ID  Procedures:     Antimicrobials: Anti-infectives (From admission, onward)   Start     Dose/Rate Route Frequency Ordered Stop   05/01/20 1130  gentamicin (GARAMYCIN) 240 mg in dextrose 5 % 100 mL IVPB     3 mg/kg  80.1 kg (Adjusted) 106 mL/hr over 60 Minutes Intravenous  Every 24 hours 05/01/20 1055     04/30/20 1300  vancomycin (VANCOREADY) IVPB 1250 mg/250 mL  Status:  Discontinued     1,250 mg 166.7 mL/hr over 90 Minutes Intravenous Every 12 hours 04/29/20 2130 04/30/20 0710   04/30/20 0800  cefTRIAXone (ROCEPHIN) 2 g in sodium chloride 0.9 % 100 mL IVPB     2 g 200 mL/hr over 30 Minutes Intravenous Every 24 hours 04/30/20 0710     04/30/20 0100  vancomycin (VANCOREADY) IVPB 1500 mg/300 mL  Status:   Discontinued     1,500 mg 150 mL/hr over 120 Minutes Intravenous Every 12 hours 04/29/20 2112 04/29/20 2113   04/30/20 0100  vancomycin (VANCOREADY) IVPB 2000 mg/400 mL     2,000 mg 200 mL/hr over 120 Minutes Intravenous  Once 04/29/20 2130 04/30/20 0317      Subjective: Eager to be going home soon  Objective: Vitals:   05/01/20 0936 05/01/20 1426 05/01/20 2147 05/02/20 0512  BP: 131/61 (!) 114/57 128/70 (!) 141/71  Pulse: 65 69 63 70  Resp: 18 18 17 18   Temp: 97.7 F (36.5 C) 97.7 F (36.5 C) 98.5 F (36.9 C) 98.2 F (36.8 C)  TempSrc: Oral Oral Oral Oral  SpO2: 100% 100% 97% 99%  Weight:      Height:        Intake/Output Summary (Last 24 hours) at 05/02/2020 1452 Last data filed at 05/02/2020 1300 Gross per 24 hour  Intake 896.44 ml  Output 2475 ml  Net -1578.56 ml   Filed Weights   04/29/20 2115  Weight: 99.3 kg    Examination: General exam: Conversant, in no acute distress Respiratory system: normal chest rise, clear, no audible wheezing Cardiovascular system: regular rhythm, s1-s2 Gastrointestinal system: Nondistended, nontender, pos BS Central nervous system: No seizures, no tremors Extremities: No cyanosis, no joint deformities Skin: No rashes, no pallor Psychiatry: Affect normal // no auditory hallucinations   Data Reviewed: I have personally reviewed following labs and imaging studies  CBC: Recent Labs  Lab 04/29/20 1129 05/01/20 0232 05/02/20 0656  WBC 9.1 6.6 7.3  NEUTROABS  --   --  5.3  HGB 11.5* 10.8* 11.0*  HCT 35.4* 32.5* 33.5*  MCV 96.5 95.6 95.4  PLT 162 167 [id number]   Basic Metabolic Panel: Recent Labs  Lab 04/29/20 1129 05/01/20 0232 05/02/20 0656  NA 128* 132* 132*  K 3.8 4.1 4.0  CL 99 104 101  CO2 20* 20* 23  GLUCOSE 195* 118* 115*  BUN 17 13 11   CREATININE 1.19 0.86 0.94  CALCIUM 8.7* 8.5* 8.9   GFR: Estimated Creatinine Clearance: 81.7 mL/min (by C-G formula based on SCr of 0.94 mg/dL). Liver Function Tests: Recent  Labs  Lab 04/29/20 1129 05/01/20 0232 05/02/20 0656  AST 44* 41 32  ALT 37 42 36  ALKPHOS 58 56 62  BILITOT 0.6 0.5 0.9  PROT 7.4 6.4* 6.9  ALBUMIN 3.4* 3.0* 3.1*   No results for input(s): LIPASE, AMYLASE in the last 168 hours. No results for input(s): AMMONIA in the last 168 hours. Coagulation Profile: No results for input(s): INR, PROTIME in the last 168 hours. Cardiac Enzymes: No results for input(s): CKTOTAL, CKMB, CKMBINDEX, TROPONINI in the last 168 hours. BNP (last 3 results) No results for input(s): PROBNP in the last 8760 hours. HbA1C: Recent Labs    04/30/20 0425  HGBA1C 6.6*   CBG: Recent Labs  Lab 05/01/20 1159 05/01/20 1628 05/01/20 2113 05/02/20 0805 05/02/20 1214  GLUCAP 101*  163* 127* 107* 140*   Lipid Profile: No results for input(s): CHOL, HDL, LDLCALC, TRIG, CHOLHDL, LDLDIRECT in the last 72 hours. Thyroid Function Tests: No results for input(s): TSH, T4TOTAL, FREET4, T3FREE, THYROIDAB in the last 72 hours. Anemia Panel: No results for input(s): VITAMINB12, FOLATE, FERRITIN, TIBC, IRON, RETICCTPCT in the last 72 hours. Sepsis Labs: Recent Labs  Lab 04/29/20 1129 04/29/20 1749  LATICACIDVEN 1.6 1.0    Recent Results (from the past 240 hour(s))  Urine culture     Status: None   Collection Time: 04/29/20  5:11 PM   Specimen: Urine, Clean Catch  Result Value Ref Range Status   Specimen Description URINE, CLEAN CATCH  Final   Special Requests NONE  Final   Culture   Final    NO GROWTH Performed at [facility], [place] [address] [place], [place] [address]    Report Status 04/30/2020 FINAL  Final  Culture, blood (routine x 2)     Status: Abnormal   Collection Time: 04/29/20  5:42 PM   Specimen: BLOOD LEFT FOREARM  Result Value Ref Range Status   Specimen Description BLOOD LEFT FOREARM  Final   Special Requests   Final    BOTTLES DRAWN AEROBIC AND ANAEROBIC Blood Culture adequate volume   Culture  Setup Time   Final    IN BOTH  AEROBIC AND ANAEROBIC BOTTLES GRAM POSITIVE COCCI CRITICAL VALUE NOTED.  VALUE IS CONSISTENT WITH PREVIOUSLY REPORTED AND CALLED VALUE.    Culture (A)  Final    STREPTOCOCCUS INFANTARIUS SUSCEPTIBILITIES PERFORMED ON PREVIOUS CULTURE WITHIN THE LAST 5 DAYS. Performed at [facility], [place] [address], [place], [place] [address]    Report Status 05/02/2020 FINAL  Final  Culture, blood (routine x 2)     Status: Abnormal   Collection Time: 04/29/20  5:49 PM   Specimen: BLOOD  Result Value Ref Range Status   Specimen Description BLOOD RIGHT ANTECUBITAL  Final   Special Requests   Final    BOTTLES DRAWN AEROBIC AND ANAEROBIC Blood Culture adequate volume   Culture  Setup Time   Final    IN BOTH AEROBIC AND ANAEROBIC BOTTLES GRAM POSITIVE COCCI CRITICAL RESULT CALLED TO, READ BACK BY AND VERIFIED WITH: Maryann Alar PT:[id number] [place] Performed at [facility], [place] [address] [place], [place] [address]    Culture STREPTOCOCCUS INFANTARIUS (A)  Final   Report Status 05/02/2020 FINAL  Final   Organism ID, Bacteria STREPTOCOCCUS INFANTARIUS  Final      Susceptibility   Streptococcus infantarius - MIC*    ERYTHROMYCIN <=0.12 SENSITIVE Sensitive     TETRACYCLINE 1 SENSITIVE Sensitive     VANCOMYCIN 0.5 SENSITIVE Sensitive     CLINDAMYCIN <=0.25 SENSITIVE Sensitive     CEFTRIAXONE Value in next row Sensitive      SENSITIVE<=0.12    PENICILLIN Value in next row Intermediate      INTERMEDIATE0.25    * STREPTOCOCCUS INFANTARIUS  Blood Culture ID Panel (Reflexed)     Status: Abnormal   Collection Time: 04/29/20  5:49 PM  Result Value Ref Range Status   Enterococcus species NOT DETECTED NOT DETECTED Final   Listeria monocytogenes NOT DETECTED NOT DETECTED Final   Staphylococcus species NOT DETECTED NOT DETECTED Final   Staphylococcus aureus (BCID) NOT DETECTED NOT DETECTED Final   Streptococcus species DETECTED (A) NOT DETECTED Final    Comment: Not Enterococcus species,  Streptococcus agalactiae, Streptococcus pyogenes, or Streptococcus pneumoniae. CRITICAL  RESULT CALLED TO, READ BACK BY AND VERIFIED WITH: PHARMD Gorden Harms PT:[id number] FCP    Streptococcus agalactiae NOT DETECTED NOT DETECTED Final   Streptococcus pneumoniae NOT DETECTED NOT DETECTED Final   Streptococcus pyogenes NOT DETECTED NOT DETECTED Final   Acinetobacter baumannii NOT DETECTED NOT DETECTED Final   Enterobacteriaceae species NOT DETECTED NOT DETECTED Final   Enterobacter cloacae complex NOT DETECTED NOT DETECTED Final   Escherichia coli NOT DETECTED NOT DETECTED Final   Klebsiella oxytoca NOT DETECTED NOT DETECTED Final   Klebsiella pneumoniae NOT DETECTED NOT DETECTED Final   Proteus species NOT DETECTED NOT DETECTED Final   Serratia marcescens NOT DETECTED NOT DETECTED Final   Haemophilus influenzae NOT DETECTED NOT DETECTED Final   Neisseria meningitidis NOT DETECTED NOT DETECTED Final   Pseudomonas aeruginosa NOT DETECTED NOT DETECTED Final   Candida albicans NOT DETECTED NOT DETECTED Final   Candida glabrata NOT DETECTED NOT DETECTED Final   Candida krusei NOT DETECTED NOT DETECTED Final   Candida parapsilosis NOT DETECTED NOT DETECTED Final   Candida tropicalis NOT DETECTED NOT DETECTED Final    Comment: Performed at [facility], [place] [address] [place], [place] [address]  SARS Coronavirus 2 by RT PCR (hospital order, performed in [facility] hospital lab) Nasopharyngeal Nasopharyngeal Swab     Status: None   Collection Time: 04/29/20  8:02 PM   Specimen: Nasopharyngeal Swab  Result Value Ref Range Status   SARS Coronavirus 2 NEGATIVE NEGATIVE Final    Comment: (NOTE) SARS-CoV-2 target nucleic acids are NOT DETECTED. The SARS-CoV-2 RNA is generally detectable in upper and lower respiratory specimens during the acute phase of infection. The lowest concentration of SARS-CoV-2 viral copies this assay can detect is 250 copies / mL. A negative result does not preclude  SARS-CoV-2 infection and should not be used as the sole basis for treatment or other patient management decisions.  A negative result may occur with improper specimen collection / handling, submission of specimen other than nasopharyngeal swab, presence of viral mutation(s) within the areas targeted by this assay, and inadequate number of viral copies (<250 copies / mL). A negative result must be combined with clinical observations, patient history, and epidemiological information. Fact Sheet for Patients:   StrictlyIdeas.no Fact Sheet for Healthcare Providers: BankingDealers.co.za This test is not yet approved or cleared  by the [place] FDA and has been authorized for detection and/or diagnosis of SARS-CoV-2 by FDA under an Emergency Use Authorization (EUA).  This EUA will remain in effect (meaning this test can be used) for the duration of the COVID-19 declaration under Section 564(b)(1) of the Act, 21 U.S.C. section 360bbb-3(b)(1), unless the authorization is terminated or revoked sooner. Performed at [facility], [place] [address], [place], [place] [address]   Blood culture (routine x 2)     Status: Abnormal   Collection Time: 04/29/20 10:02 PM   Specimen: BLOOD LEFT HAND  Result Value Ref Range Status   Specimen Description BLOOD LEFT HAND  Final   Special Requests   Final    BOTTLES DRAWN AEROBIC AND ANAEROBIC Blood Culture adequate volume   Culture  Setup Time   Final    GRAM POSITIVE COCCI IN PAIRS IN CHAINS IN BOTH AEROBIC AND ANAEROBIC BOTTLES CRITICAL RESULT CALLED TO, READ BACK BY AND VERIFIED WITH: T BAUMEISTER,PHARMD AT 0946 04/30/20 BY L BENFIELD    Culture (A)  Final    STREPTOCOCCUS INFANTARIUS SUSCEPTIBILITIES PERFORMED ON PREVIOUS CULTURE WITHIN THE LAST  5 DAYS. Performed at [facility], [place] [address], [place], [place] [address]    Report Status 05/02/2020 FINAL  Final  Blood culture (routine x  2)     Status: Abnormal   Collection Time: 04/29/20 10:02 PM   Specimen: BLOOD  Result Value Ref Range Status   Specimen Description BLOOD LEFT ANTECUBITAL  Final   Special Requests   Final    BOTTLES DRAWN AEROBIC AND ANAEROBIC Blood Culture results may not be optimal due to an excessive volume of blood received in culture bottles   Culture  Setup Time   Final    GRAM POSITIVE COCCI IN PAIRS IN CHAINS IN BOTH AEROBIC AND ANAEROBIC BOTTLES CRITICAL VALUE NOTED.  VALUE IS CONSISTENT WITH PREVIOUSLY REPORTED AND CALLED VALUE.    Culture (A)  Final    STREPTOCOCCUS INFANTARIUS SUSCEPTIBILITIES PERFORMED ON PREVIOUS CULTURE WITHIN THE LAST 5 DAYS. Performed at [facility], [place] [address] [place], [place] [address]    Report Status 05/02/2020 FINAL  Final  Culture, blood (routine x 2)     Status: None (Preliminary result)   Collection Time: 05/01/20 10:38 AM   Specimen: BLOOD LEFT ARM  Result Value Ref Range Status   Specimen Description BLOOD LEFT ARM  Final   Special Requests   Final    BOTTLES DRAWN AEROBIC AND ANAEROBIC Blood Culture adequate volume   Culture   Final    NO GROWTH < 24 HOURS Performed at [facility], [place] [address], [place], [place] [address]    Report Status PENDING  Incomplete  Culture, blood (routine x 2)     Status: None (Preliminary result)   Collection Time: 05/01/20 10:42 AM   Specimen: BLOOD RIGHT HAND  Result Value Ref Range Status   Specimen Description BLOOD RIGHT HAND  Final   Special Requests   Final    BOTTLES DRAWN AEROBIC ONLY Blood Culture results may not be optimal due to an inadequate volume of blood received in culture bottles   Culture   Final    NO GROWTH < 24 HOURS Performed at [facility], [place] [address] [place], [place] [address]    Report Status PENDING  Incomplete     Radiology Studies: ECHO TEE  Result Date: 05/01/2020    TRANSESOPHOGEAL ECHO REPORT   Patient Name:   Theodore Jenkins [facility] Date of Exam: 05/01/2020  Medical Rec #:  [id number]   Height:       67.5 in Accession #:    [id number]  Weight:       219.0 lb Date of Birth:  03/01/1946  BSA:          2.113 m Patient Age:    44 years    BP:           154/62 mmHg Patient Gender: M           HR:           67 bpm. Exam Location:  Inpatient Procedure: Transesophageal Echo, Color Doppler, Cardiac Doppler and 3D Echo Indications:     Bacteremia  History:         Patient has prior history of Echocardiogram examinations, most                  recent 05/30/2018. CAD; Risk Factors:Hypertension, Diabetes and                  Dyslipidemia.  Aortic Valve: 25 mm Edwards Magna Ease Bovine Pericardial                  Tissue valve is present in the aortic position. Procedure Date:                  10/26/2017.  Sonographer:     Mikki Santee RDCS (AE) Referring Phys:  [id number] Tami Lin [facility] Diagnosing Phys: Buford Dresser MD PROCEDURE: After discussion of the risks and benefits of a TEE, an informed consent was obtained. The transesophogeal probe was passed without difficulty through the esophogus of the patient. Sedation performed by different physician. The patient was monitored while under deep sedation. Anesthestetic sedation was provided intravenously by Anesthesiology: 377.83mg  of Propofol, 40mg  of Lidocaine. The patient developed no complications during the procedure. IMPRESSIONS  1. Left ventricular ejection fraction, by estimation, is 55 to 60%. The left ventricle has normal function. The left ventricle has no regional wall motion abnormalities.  2. Right ventricular systolic function is normal. The right ventricular size is normal.  3. No left atrial/left atrial appendage thrombus was detected.  4. The mitral valve is normal in structure. Trivial mitral valve regurgitation. No evidence of mitral stenosis.  5. No central regurgitation, trivial perivalvular regurgitation. There is significant thickening of one biosprosthetic leaflet, suggestive of  endocarditis. No visualized perivalvular abscess. The aortic valve has been repaired/replaced. Aortic valve regurgitation is trivial. There is a 25 mm Edwards Magna Ease Bovine Pericardial Tissue valve present in the aortic position. Procedure Date: 10/26/2017. Echo findings are consistent with thrombus/mass of the aortic prosthesis. Aortic valve mean gradient  measures 16.0 mmHg.  6. Pulmonic valve regurgitation not well visualized.  7. Aortic S/P bioprosthetic AVR. There is Severe (Grade IV) protruding and atheroma plaque involving the descending aorta. Conclusion(s)/Recommendation(s): Endocarditis of the bioprosthetic aortic valve. No large independently mobile mass or significant central regurgitation, but prominent thickening of a single bioprosthetic leaflet, best visualized in image 80. FINDINGS  Left Ventricle: Left ventricular ejection fraction, by estimation, is 55 to 60%. The left ventricle has normal function. The left ventricle has no regional wall motion abnormalities. The left ventricular internal cavity size was normal in size. There is  no left ventricular hypertrophy. Right Ventricle: The right ventricular size is normal. No increase in right ventricular wall thickness. Right ventricular systolic function is normal. Left Atrium: Left atrial size was not assessed. No left atrial/left atrial appendage thrombus was detected. Right Atrium: Right atrial size was not assessed. Prominent Chiari network. Pericardium: There is no evidence of pericardial effusion. Mitral Valve: The mitral valve is normal in structure. Trivial mitral valve regurgitation. No evidence of mitral valve stenosis. There is no evidence of mitral valve vegetation. Tricuspid Valve: The tricuspid valve is normal in structure. Tricuspid valve regurgitation is trivial. There is no evidence of tricuspid valve vegetation. Aortic Valve: No central regurgitation, trivial perivalvular regurgitation. There is significant thickening of one  biosprosthetic leaflet, suggestive of endocarditis. No visualized perivalvular abscess. The aortic valve has been repaired/replaced. Aortic  valve regurgitation is trivial. Aortic valve mean gradient measures 16.0 mmHg. Aortic valve peak gradient measures 32.0 mmHg. There is a 25 mm Edwards Magna Ease Bovine Pericardial Tissue valve present in the aortic position. Procedure Date: 10/26/2017. Pulmonic Valve: Shadow from aortic valve makes evaluation of PV difficult. The pulmonic valve was not well visualized. Pulmonic valve regurgitation not well visualized. Aorta: S/P bioprosthetic AVR. There is severe (Grade IV) protruding and atheroma plaque involving the descending aorta.  IAS/Shunts: No atrial level shunt detected by color flow Doppler.  AORTIC VALVE AV Vmax:           283.00 cm/s AV Vmean:          191.000 cm/s AV VTI:            0.591 m AV Peak Grad:      32.0 mmHg AV Mean Grad:      16.0 mmHg LVOT Vmax:         177.00 cm/s LVOT Vmean:        123.000 cm/s LVOT VTI:          0.353 m LVOT/AV VTI ratio: 0.60 TRICUSPID VALVE TR Peak grad:   16.8 mmHg TR Vmax:        205.00 cm/s  SHUNTS Systemic VTI: 0.35 m Buford Dresser MD Electronically signed by Buford Dresser MD Signature Date/Time: 05/01/2020/11:43:02 AM    Final     Scheduled Meds: . aspirin EC  81 mg Oral Daily  . enoxaparin (LOVENOX) injection  40 mg Subcutaneous Daily  . gabapentin  600 mg Oral Daily  . insulin aspart  0-5 Units Subcutaneous QHS  . insulin aspart  0-9 Units Subcutaneous TID WC  . lisinopril  5 mg Oral Daily  . loratadine  10 mg Oral Daily  . metoprolol succinate  50 mg Oral Daily  . pantoprazole  40 mg Oral Daily  . polyethylene glycol  17 g Oral Daily  . rosuvastatin  10 mg Oral Daily   Continuous Infusions: . cefTRIAXone (ROCEPHIN)  IV 2 g (05/02/20 0944)  . gentamicin 240 mg (05/02/20 1105)     LOS: 3 days   Marylu Lund, MD [facility] Pager On Amion  If 7PM-7AM, please contact  night-coverage 05/02/2020, 2:52 PM

## 2020-05-03 ENCOUNTER — Inpatient Hospital Stay: Payer: Self-pay

## 2020-05-03 LAB — GLUCOSE, CAPILLARY
Glucose-Capillary: 101 mg/dL — ABNORMAL HIGH (ref 70–99)
Glucose-Capillary: 144 mg/dL — ABNORMAL HIGH (ref 70–99)
Glucose-Capillary: 129 mg/dL — ABNORMAL HIGH (ref 70–99)
Glucose-Capillary: 96 mg/dL (ref 70–99)

## 2020-05-03 MED ORDER — LACTULOSE 10 GM/15ML PO SOLN
20.0000 g | ORAL | Status: AC
  Administered 2020-05-03: 20 g via ORAL
  Filled 2020-05-03: qty 30

## 2020-05-03 MED ORDER — POLYETHYLENE GLYCOL 3350 17 G PO PACK: 17.0000 g | PACK | Freq: Every day | ORAL | Status: DC | PRN

## 2020-05-03 MED ORDER — POLYETHYLENE GLYCOL 3350 17 GM/SCOOP PO POWD
1.0000 | Freq: Once | ORAL | Status: AC
  Administered 2020-05-03: 255 g via ORAL
  Filled 2020-05-03: qty 255

## 2020-05-03 MED ORDER — BISACODYL 10 MG RE SUPP
10.0000 mg | Freq: Once | RECTAL | Status: AC
  Administered 2020-05-03: 10 mg via RECTAL
  Filled 2020-05-03: qty 1

## 2020-05-03 NOTE — Progress Notes (Signed)
RT placed patient on CPAP. Patient tolerating well at this time. RT will monitor as needed. 

## 2020-05-03 NOTE — Progress Notes (Signed)
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 Using 5/21 as day 1  He is no longer bacteremic, he can have picc line placed as of today, 5/23  Caren Griffins B. [place] [facility] [phone]

## 2020-05-03 NOTE — Progress Notes (Signed)
PROGRESS NOTE    Theodore Jenkins  [id number] DOB: [birth year] DOA: 04/29/2020 PCP: Karlene Einstein, MD    Brief Narrative:  74 y.o. male with medical history significant of severe aortic stenosis status post bioprosthetic aortic valve replacement in 2018, CAD status post PCI and CABG, diabetes, hypertension, hyperlipidemia, GERD, obesity (BMI 33.79), OSA on CPAP presenting to the ED for evaluation of fevers and malaise.  He was sent to the ED by his PCP for IV antibiotics as recent blood cultures done 5/18 grew gram positive cocci. Echo done 5/19 negative for vegetation.   Patient states he has been having fever since the beginning of this month.  Also feeling tired.  He was initially treated with antibiotics and fevers resolved.  However, after finishing his antibiotic course, he started having fevers as high as 102 to 104 F again.  Denies cough, shortness of breath, chest pain, nausea, vomiting, abdominal pain, diarrhea, or dysuria.  ED Course: Febrile with temperature 102 F. Not tachycardic or hypotensive.  Labs showing no leukocytosis.  Lactic acid normal x2.  Sodium 128, mildly low on previous labs as well.  UA not suggestive of infection.  SARS-CoV-2 PCR test negative.  Chest x-ray not suggestive of pneumonia.  Assessment & Plan:   Principal Problem:   Bacteremia Active Problems:   Coronary artery disease involving native coronary artery of native heart without angina pectoris   Hyperlipidemia LDL goal <70   Essential hypertension   Hyponatremia   Acute bacterial endocarditis   Bacteremia:  -Patient reports having fevers since the beginning of this month.   -Blood cultures done by PCP on 5/18 grew gram-positive cocci. Blood cultures now pos for Strep infantarius. -Transthoracic echo done 5/19, reportedly negative for vegetation.  -Follow up TEE performed on 5/21 confirms significant thickening of one bioprosthetic leaflet, suggestive of endocarditis -ID recommendation for  continued rocephin with added gentamicin -Repeat blood cx negative. Have requested PICC placement -No longer febrile. Without leukocytosis  Hyponatremia:  -Presenting sodium 128.   -Improved with IVF and remains stable  Non-insulin-dependent type 2 diabetes:  -A1c 6.6.   -Continue sliding scale insulin sensitive ACHS and CBG checks as needed  OSA: Continue CPAP at night as tolerated  Hypertension: Remains stable.  Continue home lisinopril and metoprolol as tolerated  Hyperlipidemia: Continue home Crestor  GERD: Continue home PPI as tolerated  CAD: Stable.  Continue home aspirin, beta-blocker, and statin as toleratred  DVT prophylaxis: Lovenox subq Code Status: Full Family Communication: Pt in room, family at bedside  Status is: Inpatient  Remains inpatient appropriate because:Ongoing diagnostic testing needed not appropriate for outpatient work up and IV treatments appropriate due to intensity of illness or inability to take PO   Dispo: The patient is from: Home              Anticipated d/c is to: Home              Anticipated d/c date is: 3 days              Patient currently is not medically stable to d/c.   Consultants:   Cardiology  ID  Procedures:     Antimicrobials: Anti-infectives (From admission, onward)   Start     Dose/Rate Route Frequency Ordered Stop   05/01/20 1130  gentamicin (GARAMYCIN) 240 mg in dextrose 5 % 100 mL IVPB     3 mg/kg  80.1 kg (Adjusted) 106 mL/hr over 60 Minutes Intravenous Every 24 hours 05/01/20 1055  04/30/20 1300  vancomycin (VANCOREADY) IVPB 1250 mg/250 mL  Status:  Discontinued     1,250 mg 166.7 mL/hr over 90 Minutes Intravenous Every 12 hours 04/29/20 2130 04/30/20 0710   04/30/20 0800  cefTRIAXone (ROCEPHIN) 2 g in sodium chloride 0.9 % 100 mL IVPB     2 g 200 mL/hr over 30 Minutes Intravenous Every 24 hours 04/30/20 0710     04/30/20 0100  vancomycin (VANCOREADY) IVPB 1500 mg/300 mL  Status:  Discontinued      1,500 mg 150 mL/hr over 120 Minutes Intravenous Every 12 hours 04/29/20 2112 04/29/20 2113   04/30/20 0100  vancomycin (VANCOREADY) IVPB 2000 mg/400 mL     2,000 mg 200 mL/hr over 120 Minutes Intravenous  Once 04/29/20 2130 04/30/20 0317      Subjective: Without complaints. Eager to go home soon  Objective: Vitals:   05/02/20 0512 05/02/20 1511 05/02/20 2013 05/03/20 0514  BP: (!) 141/71 128/65 131/61 (!) 141/66  Pulse: 70 67 62 67  Resp: 18 18 16 17   Temp: 98.2 F (36.8 C) 98 F (36.7 C) 98.3 F (36.8 C) 97.9 F (36.6 C)  TempSrc: Oral Oral    SpO2: 99% 97% 97% 100%  Weight:      Height:        Intake/Output Summary (Last 24 hours) at 05/03/2020 1447 Last data filed at 05/03/2020 0700 Gross per 24 hour  Intake 240 ml  Output 1820 ml  Net -1580 ml   Filed Weights   04/29/20 2115  Weight: 99.3 kg    Examination: General exam: Awake, laying in bed, in nad Respiratory system: Normal respiratory effort, no wheezing Cardiovascular system: regular rate, s1, s2 Gastrointestinal system: Soft, nondistended, positive BS Central nervous system: CN2-12 grossly intact, strength intact Extremities: Perfused, no clubbing Skin: Normal skin turgor, no notable skin lesions seen Psychiatry: Mood normal // no visual hallucinations   Data Reviewed: I have personally reviewed following labs and imaging studies  CBC: Recent Labs  Lab 04/29/20 1129 05/01/20 0232 05/02/20 0656  WBC 9.1 6.6 7.3  NEUTROABS  --   --  5.3  HGB 11.5* 10.8* 11.0*  HCT 35.4* 32.5* 33.5*  MCV 96.5 95.6 95.4  PLT 162 167 [id number]   Basic Metabolic Panel: Recent Labs  Lab 04/29/20 1129 05/01/20 0232 05/02/20 0656  NA 128* 132* 132*  K 3.8 4.1 4.0  CL 99 104 101  CO2 20* 20* 23  GLUCOSE 195* 118* 115*  BUN 17 13 11   CREATININE 1.19 0.86 0.94  CALCIUM 8.7* 8.5* 8.9   GFR: Estimated Creatinine Clearance: 81.7 mL/min (by C-G formula based on SCr of 0.94 mg/dL). Liver Function Tests: Recent  Labs  Lab 04/29/20 1129 05/01/20 0232 05/02/20 0656  AST 44* 41 32  ALT 37 42 36  ALKPHOS 58 56 62  BILITOT 0.6 0.5 0.9  PROT 7.4 6.4* 6.9  ALBUMIN 3.4* 3.0* 3.1*   No results for input(s): LIPASE, AMYLASE in the last 168 hours. No results for input(s): AMMONIA in the last 168 hours. Coagulation Profile: No results for input(s): INR, PROTIME in the last 168 hours. Cardiac Enzymes: No results for input(s): CKTOTAL, CKMB, CKMBINDEX, TROPONINI in the last 168 hours. BNP (last 3 results) No results for input(s): PROBNP in the last 8760 hours. HbA1C: No results for input(s): HGBA1C in the last 72 hours. CBG: Recent Labs  Lab 05/02/20 1214 05/02/20 1827 05/02/20 2011 05/03/20 0754 05/03/20 1115  GLUCAP 140* 228* 146* 101* 144*   Lipid  Profile: No results for input(s): CHOL, HDL, LDLCALC, TRIG, CHOLHDL, LDLDIRECT in the last 72 hours. Thyroid Function Tests: No results for input(s): TSH, T4TOTAL, FREET4, T3FREE, THYROIDAB in the last 72 hours. Anemia Panel: No results for input(s): VITAMINB12, FOLATE, FERRITIN, TIBC, IRON, RETICCTPCT in the last 72 hours. Sepsis Labs: Recent Labs  Lab 04/29/20 1129 04/29/20 1749  LATICACIDVEN 1.6 1.0    Recent Results (from the past 240 hour(s))  Urine culture     Status: None   Collection Time: 04/29/20  5:11 PM   Specimen: Urine, Clean Catch  Result Value Ref Range Status   Specimen Description URINE, CLEAN CATCH  Final   Special Requests NONE  Final   Culture   Final    NO GROWTH Performed at [facility], [place] [address], [place], [place] [address]    Report Status 04/30/2020 FINAL  Final  Culture, blood (routine x 2)     Status: Abnormal   Collection Time: 04/29/20  5:42 PM   Specimen: BLOOD LEFT FOREARM  Result Value Ref Range Status   Specimen Description BLOOD LEFT FOREARM  Final   Special Requests   Final    BOTTLES DRAWN AEROBIC AND ANAEROBIC Blood Culture adequate volume   Culture  Setup Time   Final    IN  BOTH AEROBIC AND ANAEROBIC BOTTLES GRAM POSITIVE COCCI CRITICAL VALUE NOTED.  VALUE IS CONSISTENT WITH PREVIOUSLY REPORTED AND CALLED VALUE.    Culture (A)  Final    STREPTOCOCCUS INFANTARIUS SUSCEPTIBILITIES PERFORMED ON PREVIOUS CULTURE WITHIN THE LAST 5 DAYS. Performed at [facility], [place] [address] [place], [place] [address]    Report Status 05/02/2020 FINAL  Final  Culture, blood (routine x 2)     Status: Abnormal   Collection Time: 04/29/20  5:49 PM   Specimen: BLOOD  Result Value Ref Range Status   Specimen Description BLOOD RIGHT ANTECUBITAL  Final   Special Requests   Final    BOTTLES DRAWN AEROBIC AND ANAEROBIC Blood Culture adequate volume   Culture  Setup Time   Final    IN BOTH AEROBIC AND ANAEROBIC BOTTLES GRAM POSITIVE COCCI CRITICAL RESULT CALLED TO, READ BACK BY AND VERIFIED WITH: Maryann Alar PT:[id number] [place] Performed at [facility], [place] [address], [place], [place] [address]    Culture STREPTOCOCCUS INFANTARIUS (A)  Final   Report Status 05/02/2020 FINAL  Final   Organism ID, Bacteria STREPTOCOCCUS INFANTARIUS  Final      Susceptibility   Streptococcus infantarius - MIC*    ERYTHROMYCIN <=0.12 SENSITIVE Sensitive     TETRACYCLINE 1 SENSITIVE Sensitive     VANCOMYCIN 0.5 SENSITIVE Sensitive     CLINDAMYCIN <=0.25 SENSITIVE Sensitive     CEFTRIAXONE Value in next row Sensitive      SENSITIVE<=0.12    PENICILLIN Value in next row Intermediate      INTERMEDIATE0.25    * STREPTOCOCCUS INFANTARIUS  Blood Culture ID Panel (Reflexed)     Status: Abnormal   Collection Time: 04/29/20  5:49 PM  Result Value Ref Range Status   Enterococcus species NOT DETECTED NOT DETECTED Final   Listeria monocytogenes NOT DETECTED NOT DETECTED Final   Staphylococcus species NOT DETECTED NOT DETECTED Final   Staphylococcus aureus (BCID) NOT DETECTED NOT DETECTED Final   Streptococcus species DETECTED (A) NOT DETECTED Final    Comment: Not Enterococcus species,  Streptococcus agalactiae, Streptococcus pyogenes, or Streptococcus pneumoniae. CRITICAL RESULT CALLED TO, READ BACK BY AND  VERIFIED WITH: PHARMD Gorden Harms PT:[id number] [facility]    Streptococcus agalactiae NOT DETECTED NOT DETECTED Final   Streptococcus pneumoniae NOT DETECTED NOT DETECTED Final   Streptococcus pyogenes NOT DETECTED NOT DETECTED Final   Acinetobacter baumannii NOT DETECTED NOT DETECTED Final   Enterobacteriaceae species NOT DETECTED NOT DETECTED Final   Enterobacter cloacae complex NOT DETECTED NOT DETECTED Final   Escherichia coli NOT DETECTED NOT DETECTED Final   Klebsiella oxytoca NOT DETECTED NOT DETECTED Final   Klebsiella pneumoniae NOT DETECTED NOT DETECTED Final   Proteus species NOT DETECTED NOT DETECTED Final   Serratia marcescens NOT DETECTED NOT DETECTED Final   Haemophilus influenzae NOT DETECTED NOT DETECTED Final   Neisseria meningitidis NOT DETECTED NOT DETECTED Final   Pseudomonas aeruginosa NOT DETECTED NOT DETECTED Final   Candida albicans NOT DETECTED NOT DETECTED Final   Candida glabrata NOT DETECTED NOT DETECTED Final   Candida krusei NOT DETECTED NOT DETECTED Final   Candida parapsilosis NOT DETECTED NOT DETECTED Final   Candida tropicalis NOT DETECTED NOT DETECTED Final    Comment: Performed at [facility], [place] [address], [place], [place] [address]  SARS Coronavirus 2 by RT PCR (hospital order, performed in [facility] hospital lab) Nasopharyngeal Nasopharyngeal Swab     Status: None   Collection Time: 04/29/20  8:02 PM   Specimen: Nasopharyngeal Swab  Result Value Ref Range Status   SARS Coronavirus 2 NEGATIVE NEGATIVE Final    Comment: (NOTE) SARS-CoV-2 target nucleic acids are NOT DETECTED. The SARS-CoV-2 RNA is generally detectable in upper and lower respiratory specimens during the acute phase of infection. The lowest concentration of SARS-CoV-2 viral copies this assay can detect is 250 copies / mL. A negative result does not preclude  SARS-CoV-2 infection and should not be used as the sole basis for treatment or other patient management decisions.  A negative result may occur with improper specimen collection / handling, submission of specimen other than nasopharyngeal swab, presence of viral mutation(s) within the areas targeted by this assay, and inadequate number of viral copies (<250 copies / mL). A negative result must be combined with clinical observations, patient history, and epidemiological information. Fact Sheet for Patients:   StrictlyIdeas.no Fact Sheet for Healthcare Providers: BankingDealers.co.za This test is not yet approved or cleared  by the [place] FDA and has been authorized for detection and/or diagnosis of SARS-CoV-2 by FDA under an Emergency Use Authorization (EUA).  This EUA will remain in effect (meaning this test can be used) for the duration of the COVID-19 declaration under Section 564(b)(1) of the Act, 21 U.S.C. section 360bbb-3(b)(1), unless the authorization is terminated or revoked sooner. Performed at [facility], [place] [address] [place], [place] [address]   Blood culture (routine x 2)     Status: Abnormal   Collection Time: 04/29/20 10:02 PM   Specimen: BLOOD LEFT HAND  Result Value Ref Range Status   Specimen Description BLOOD LEFT HAND  Final   Special Requests   Final    BOTTLES DRAWN AEROBIC AND ANAEROBIC Blood Culture adequate volume   Culture  Setup Time   Final    GRAM POSITIVE COCCI IN PAIRS IN CHAINS IN BOTH AEROBIC AND ANAEROBIC BOTTLES CRITICAL RESULT CALLED TO, READ BACK BY AND VERIFIED WITH: T BAUMEISTER,PHARMD AT 0946 04/30/20 BY L BENFIELD    Culture (A)  Final    STREPTOCOCCUS INFANTARIUS SUSCEPTIBILITIES PERFORMED ON PREVIOUS CULTURE WITHIN THE LAST 5 DAYS. Performed at [facility]  Lab, [address], [place], [place] [address]    Report Status 05/02/2020 FINAL  Final  Blood culture (routine x  2)     Status: Abnormal   Collection Time: 04/29/20 10:02 PM   Specimen: BLOOD  Result Value Ref Range Status   Specimen Description BLOOD LEFT ANTECUBITAL  Final   Special Requests   Final    BOTTLES DRAWN AEROBIC AND ANAEROBIC Blood Culture results may not be optimal due to an excessive volume of blood received in culture bottles   Culture  Setup Time   Final    GRAM POSITIVE COCCI IN PAIRS IN CHAINS IN BOTH AEROBIC AND ANAEROBIC BOTTLES CRITICAL VALUE NOTED.  VALUE IS CONSISTENT WITH PREVIOUSLY REPORTED AND CALLED VALUE.    Culture (A)  Final    STREPTOCOCCUS INFANTARIUS SUSCEPTIBILITIES PERFORMED ON PREVIOUS CULTURE WITHIN THE LAST 5 DAYS. Performed at [facility], [place] [address] [place], [place] [address]    Report Status 05/02/2020 FINAL  Final  Culture, blood (routine x 2)     Status: None (Preliminary result)   Collection Time: 05/01/20 10:38 AM   Specimen: BLOOD LEFT ARM  Result Value Ref Range Status   Specimen Description BLOOD LEFT ARM  Final   Special Requests   Final    BOTTLES DRAWN AEROBIC AND ANAEROBIC Blood Culture adequate volume   Culture   Final    NO GROWTH 2 DAYS Performed at [facility], [place] [address], [place], [place] [address]    Report Status PENDING  Incomplete  Culture, blood (routine x 2)     Status: None (Preliminary result)   Collection Time: 05/01/20 10:42 AM   Specimen: BLOOD RIGHT HAND  Result Value Ref Range Status   Specimen Description BLOOD RIGHT HAND  Final   Special Requests   Final    BOTTLES DRAWN AEROBIC ONLY Blood Culture results may not be optimal due to an inadequate volume of blood received in culture bottles   Culture   Final    NO GROWTH 2 DAYS Performed at [facility], [place] [address] [place],  [address]    Report Status PENDING  Incomplete     Radiology Studies: [place] EKG SITE RITE  Result Date: 05/03/2020 If Site Rite image not attached, placement could not be confirmed due to current  cardiac rhythm.   Scheduled Meds: . aspirin EC  81 mg Oral Daily  . enoxaparin (LOVENOX) injection  40 mg Subcutaneous Daily  . gabapentin  600 mg Oral Daily  . insulin aspart  0-5 Units Subcutaneous QHS  . insulin aspart  0-9 Units Subcutaneous TID WC  . lisinopril  5 mg Oral Daily  . loratadine  10 mg Oral Daily  . metoprolol succinate  50 mg Oral Daily  . pantoprazole  40 mg Oral Daily  . polyethylene glycol  17 g Oral Daily  . rosuvastatin  10 mg Oral Daily   Continuous Infusions: . cefTRIAXone (ROCEPHIN)  IV 2 g (05/03/20 0807)  . gentamicin 240 mg (05/03/20 1341)     LOS: 4 days   Marylu Lund, MD [facility] Pager On Amion  If 7PM-7AM, please contact night-coverage 05/03/2020, 2:47 PM

## 2020-05-04 LAB — COMPREHENSIVE METABOLIC PANEL
ALT: 40 U/L (ref 0–44)
AST: 40 U/L (ref 15–41)
Albumin: 3.1 g/dL — ABNORMAL LOW (ref 3.5–5.0)
Alkaline Phosphatase: 63 U/L (ref 38–126)
Anion gap: 10 (ref 5–15)
BUN: 15 mg/dL (ref 8–23)
CO2: 26 mmol/L (ref 22–32)
Calcium: 9.1 mg/dL (ref 8.9–10.3)
Chloride: 95 mmol/L — ABNORMAL LOW (ref 98–111)
Creatinine, Ser: 1.11 mg/dL (ref 0.61–1.24)
GFR calc Af Amer: >60 mL/min (ref >60)
GFR calc non Af Amer: >60 mL/min (ref >60)
Glucose, Bld: 121 mg/dL — ABNORMAL HIGH (ref 70–99)
Potassium: 5.1 mmol/L (ref 3.5–5.1)
Sodium: 131 mmol/L — ABNORMAL LOW (ref 135–145)
Total Bilirubin: 0.5 mg/dL (ref 0.3–1.2)
Total Protein: 7.3 g/dL (ref 6.5–8.1)

## 2020-05-04 LAB — GLUCOSE, CAPILLARY
Glucose-Capillary: 112 mg/dL — ABNORMAL HIGH (ref 70–99)
Glucose-Capillary: 144 mg/dL — ABNORMAL HIGH (ref 70–99)

## 2020-05-04 LAB — CBC
HCT: 33.9 % — ABNORMAL LOW (ref 39.0–52.0)
Hemoglobin: 11.2 g/dL — ABNORMAL LOW (ref 13.0–17.0)
MCH: 31.5 pg (ref 26.0–34.0)
MCHC: 33 g/dL (ref 30.0–36.0)
MCV: 95.5 fL (ref 80.0–100.0)
Platelets: 232 10*3/uL (ref 150–400)
RBC: 3.55 MIL/uL — ABNORMAL LOW (ref 4.22–5.81)
RDW: 13.4 % (ref 11.5–15.5)
WBC: 9.7 10*3/uL (ref 4.0–10.5)
nRBC: 0 % (ref 0.0–0.2)

## 2020-05-04 LAB — GENTAMICIN LEVEL, TROUGH: Gentamicin Trough: <0.5 ug/mL — ABNORMAL LOW (ref 0.5–2.0)

## 2020-05-04 MED ORDER — CYCLOBENZAPRINE HCL 5 MG PO TABS: 5.0000 mg | ORAL_TABLET | Freq: Three times a day (TID) | ORAL | 0 refills | Status: DC | PRN

## 2020-05-04 MED ORDER — CHLORHEXIDINE GLUCONATE CLOTH 2 % EX PADS
6.0000 | MEDICATED_PAD | Freq: Every day | CUTANEOUS | Status: DC
  Administered 2020-05-04: 6 via TOPICAL

## 2020-05-04 MED ORDER — GENTAMICIN IV (FOR PTA / DISCHARGE USE ONLY): 240.0000 mg | INTRAVENOUS | 0 refills | Status: DC

## 2020-05-04 MED ORDER — CEFTRIAXONE IV (FOR PTA / DISCHARGE USE ONLY): 2.0000 g | INTRAVENOUS | 0 refills | Status: AC

## 2020-05-04 MED ORDER — HEPARIN SOD (PORK) LOCK FLUSH 100 UNIT/ML IV SOLN
250.0000 [IU] | INTRAVENOUS | Status: AC | PRN
  Administered 2020-05-04: 250 [IU]
  Filled 2020-05-04: qty 2.5

## 2020-05-04 MED ORDER — SODIUM CHLORIDE 0.9% FLUSH
10.0000 mL | INTRAVENOUS | Status: DC | PRN
  Administered 2020-05-04: 10 mL

## 2020-05-04 MED ORDER — POLYETHYLENE GLYCOL 3350 17 G PO PACK: 17.0000 g | PACK | Freq: Every day | ORAL | 0 refills | Status: DC

## 2020-05-04 MED FILL — POLYETHYLENE GLYCOL 3350 PO: 17 | 14 days supply | Qty: 238 | Fill #0

## 2020-05-04 MED FILL — CYCLOBENZAPRINE HCL 5 MG TA: 5 | 6 days supply | Qty: 20 | Fill #0

## 2020-05-04 NOTE — Progress Notes (Addendum)
Pharmacy Antibiotic Note  Theodore Jenkins is a 74 y.o. male admitted on 04/29/2020 with prosthetic atrial valve endocarditis. Pharmacy has been consulted for gentamicin dosing.  Due to his bioprosthetic valve and intermediate MIC of strep intermedius endocarditis, he will be on 6 wks of ceftriaxone and gentamicin rather than the 2 wks of gent combo. His scr is up slightly to 1.11 today. We will get a trough before he is dced today.   Goal trough <1  Plan: Gentamicin 3 mg/kg IV q24h Continue ceftriaxone 2g IV q24 Gent trough today  Update: Gentamicin trough returned as < 0.5, so will continue gentamicin 240 mg daily. Will require strict outpatient monitoring.   Theodore Jenkins, PharmD  Height: 5' 7.5" (171.5 cm) Weight: 99.3 kg (219 lb) IBW/kg (Calculated) : 67.25  Temp (24hrs), Avg:98 F (36.7 C), Min:97.7 F (36.5 C), Max:98.6 F (37 C)  Recent Labs  Lab 04/29/20 1129 04/29/20 1749 05/01/20 0232 05/02/20 0656 05/04/20 0248  WBC 9.1  --  6.6 7.3 9.7  CREATININE 1.19  --  0.86 0.94 1.11  LATICACIDVEN 1.6 1.0  --   --   --     Estimated Creatinine Clearance: 69.2 mL/min (by C-G formula based on SCr of 1.11 mg/dL).    Allergies  Allergen Reactions  . Oxycodone Nausea And Vomiting   Vancomycin 5/19>5/20 Ceftriaxone 5/19>>7/1 Gentamicin 5/21>>7/1  5/19 Blood cx: Streptococcus infantarius intermediate PCN 5/19 Blood cx: Theodore Jenkins 5/21 Blood cx: ngtd   Theodore Jenkins, PharmD, BCIDP, AAHIVP, CPP Infectious Disease Pharmacist 05/04/2020 11:35 AM

## 2020-05-04 NOTE — Progress Notes (Signed)
Peripherally Inserted Central Catheter Placement  The IV Nurse has discussed with the patient and/or persons authorized to consent for the patient, the purpose of this procedure and the potential benefits and risks involved with this procedure.  The benefits include less needle sticks, lab draws from the catheter, and the patient may be discharged home with the catheter. Risks include, but not limited to, infection, bleeding, blood clot (thrombus formation), and puncture of an artery; nerve damage and irregular heartbeat and possibility to perform a PICC exchange if needed/ordered by physician.  Alternatives to this procedure were also discussed.  Bard Power PICC patient education guide, fact sheet on infection prevention and patient information card has been provided to patient /or left at bedside.    PICC Placement Documentation  PICC Single Lumen [id number] PICC Basilic 40 cm 0 cm (Active)  Indication for Insertion or Continuance of Line Home intravenous therapies (PICC only) 05/04/20 0856  Exposed Catheter (cm) 0 cm 05/04/20 0856  Site Assessment Clean;Dry;Intact 05/04/20 0856  Line Status Flushed;Blood return noted 05/04/20 0856  Dressing Type Transparent 05/04/20 0856  Dressing Status Clean;Dry;Intact;Antimicrobial disc in place;Other (Comment) 05/04/20 0856  Dressing Intervention New dressing 05/04/20 0856  Dressing Change Due 05/11/20 05/04/20 0856       Theodore Jenkins 05/04/2020, 8:57 AM

## 2020-05-04 NOTE — Progress Notes (Signed)
Pt discharge sched for today. Pt provided with discharge instructions, printed appt reminders and e-prescriptions. Wife present for discharge teaching. IV team consulted to provide PICC line care. Pt sent home with PICC line for home infusion therapy. Pt verbalized understanding of discharge instructions. Pt transported to main lobby via wheelchair without incident.

## 2020-05-04 NOTE — Discharge Summary (Signed)
Physician Discharge Summary  Theodore Jenkins KZS:[id number] DOB: 09/15/1946 DOA: 04/29/2020  PCP: Theodore Einstein, MD  Admit date: 04/29/2020 Discharge date: 05/04/2020  Admitted From: Home Disposition:  Home  Recommendations for Outpatient Follow-up:  1. Follow up with PCP in 2-3 weeks 2. Routine PICC care. Please d/c PICC after completing IV abx on 7/1  Home Health:RN   Discharge Condition:Stable CODE STATUS:Full Diet recommendation: Heart healthy   Brief/Interim Summary: 74 y.o.malewith medical history significant ofsevere aortic stenosis status post bioprostheticaortic valve replacement in 2018, CAD status post PCI and CABG, diabetes, hypertension, hyperlipidemia, GERD, obesity (BMI 33.79), OSA on CPAP presenting to the ED for evaluation of fevers and malaise. He was sent to the ED by his PCP for IV antibiotics as recent blood cultures done 5/18 grew gram positive cocci.Echo done 5/19 negative for vegetation.   Patient states he has been having fever since the beginning of this month. Also feeling tired. He was initially treated with antibiotics and fevers resolved. However, after finishing his antibiotic course, he started having fevers as high as 102 to 104 F again. Denies cough, shortness of breath, chest pain, nausea, vomiting, abdominal pain, diarrhea, or dysuria.  ED Course:Febrile with temperature 102 F. Not tachycardicor hypotensive.Labs showing no leukocytosis. Lactic acid normal x2. Sodium 128, mildly low on previous labs as well. UA not suggestive of infection. SARS-CoV-2 PCR test negative. Chest x-ray not suggestive of pneumonia.  Discharge Diagnoses:  Principal Problem:   Bacteremia Active Problems:   Coronary artery disease involving native coronary artery of native heart without angina pectoris   Hyperlipidemia LDL goal <70   Essential hypertension   Hyponatremia   Acute bacterial endocarditis  Bacteremia:  -Patient reports having fevers  since the beginning of this month.  -Blood cultures done by PCP on 5/18 grew gram-positive cocci. Blood cultures now pos for Strep infantarius. -Transthoracic echo done 5/19, reportedly negativefor vegetation.  -Follow up TEE performed on 5/21 confirms significant thickening of one bioprosthetic leaflet, suggestive of endocarditis -ID recommendation for continued rocephin with added gentamicin -Repeat blood cx negative. -No longer febrile. Without leukocytosis -PICC placed 5/24. Plan to continue rocephin with gentamicin through 7/1, then remove PICC afterwards  Hyponatremia: -Presenting sodium 128.  -Improved with IVF and remains stable  Non-insulin-dependent type 2 diabetes:  -A1c 6.6.  -Continued sliding scale insulin sensitive ACHSand CBG checks as needed while in hospital -resume home regimen on d/c  DDU:KGURKYHC CPAP at night as tolerated  Hypertension:Remains stable. Continue home lisinopril and metoprolol as tolerated  Hyperlipidemia: Continue home Crestor  GERD: Continue home PPI as tolerated  CAD: Stable. Continue home aspirin, beta-blocker, and statin as toleratred   Discharge Instructions  Discharge Instructions    [facility] pharmacist to adjust dose for Vancomycin, Aminoglycosides and other anti-infective therapies as requested by physician.   Complete by: As directed    [facility] to provide Cath Flo 32m   Complete by: As directed    Administer for PICC line occlusion and as ordered by physician for other access device issues.   Anaphylaxis Kit: Provided to treat any anaphylactic reaction to the medication being provided to the patient if First Dose or when requested by physician   Complete by: As directed    Epinephrine 151mml vial / amp: Administer 0.14m114m0.14ml89mubcutaneously once for moderate to severe anaphylaxis, nurse to call physician and pharmacy when reaction occurs and call 911 if needed for immediate care    Diphenhydramine 50mg42mIV vial: Administer 25-50mg35m  IV/IM PRN for first dose reaction, rash, itching, mild reaction, nurse to call physician and pharmacy when reaction occurs   Sodium Chloride 0.9% NS 573m IV: Administer if needed for hypovolemic blood pressure drop or as ordered by physician after call to physician with anaphylactic reaction   Change dressing on IV access line weekly and PRN   Complete by: As directed    Flush IV access with Sodium Chloride 0.9% and Heparin 10 units/ml or 100 units/ml   Complete by: As directed    Home infusion instructions - [facility]   Complete by: As directed    Instructions: Flush IV access with Sodium Chloride 0.9% and Heparin 10units/ml or 100units/ml   Change dressing on IV access line: Weekly and PRN   Instructions Cath Flo 280m Administer for PICC Line occlusion and as ordered by physician for other access device   [facility] pharmacist to adjust dose for: Vancomycin, Aminoglycosides and other anti-infective therapies as requested by physician   Method of administration may be changed at the discretion of home infusion pharmacist based upon assessment of the patient and/or caregiver's ability to self-administer the medication ordered   Complete by: As directed      Allergies as of 05/04/2020      Reactions   Oxycodone Nausea And Vomiting      Medication List    STOP taking these medications   doxycycline 100 MG tablet Commonly known as: VIBRA-TABS     TAKE these medications   acetaminophen 500 MG tablet Commonly known as: TYLENOL Take 1,000 mg by mouth every 8 (eight) hours as needed for headache.   aspirin 81 MG EC tablet Take 1 tablet (81 mg total) by mouth daily.   cefTRIAXone  IVPB Commonly known as: ROCEPHIN Inject 2 g into the vein daily. Indication:  endocarditis First Dose: No Last Day of Therapy:  06/11/20 Labs - Once weekly:  CBC/D and BMP Labs - Every other week:  ESR and CRP Method of  administration: IV Push Method of administration may be changed at the discretion of home infusion pharmacist based upon assessment of the patient and/or caregiver's ability to self-administer the medication ordered.   cyclobenzaprine 5 MG tablet Commonly known as: FLEXERIL Take 1 tablet (5 mg total) by mouth 3 (three) times daily as needed for muscle spasms.   Flonase 50 MCG/ACT nasal spray Generic drug: fluticasone Place 2 sprays into both nostrils daily as needed for allergies or rhinitis.   gabapentin 300 MG capsule Commonly known as: NEURONTIN Take 600 mg by mouth daily.   gentamicin  IVPB Commonly known as: GARAMYCIN Inject 240 mg into the vein daily. Indication:  endocarditis First Dose: No Last Day of Therapy:  06/11/20 Labs - Sunday/Monday:  CBC/D, BMP, and gentamicin trough. Labs - Thursday:  BMP and gentamicin trough Labs - Every other week:  ESR and CRP Method of administration: Elastomeric Method of administration may be changed at the discretion of home infusion pharmacist based upon assessment of the patient and/or caregiver's ability to self-administer the medication ordered.   lisinopril 5 MG tablet Commonly known as: ZESTRIL Take 1 tablet (5 mg total) by mouth daily.   loratadine 10 MG tablet Commonly known as: CLARITIN Take 10 mg by mouth daily.   metoprolol succinate 50 MG 24 hr tablet Commonly known as: TOPROL-XL Take 50 mg by mouth daily. Take with or immediately following a meal.   nitroGLYCERIN 0.4 MG SL tablet Commonly known as: NITROSTAT Place 1 tablet (  0.4 mg total) under the tongue every 5 (five) minutes as needed for chest pain.   omeprazole 20 MG tablet Commonly known as: PRILOSEC OTC Take 20 mg by mouth daily.   ONE-A-DAY 50 PLUS PO Take 1 tablet by mouth daily.   OneTouch Verio test strip Generic drug: glucose blood TEST BS ONCE D   polyethylene glycol 17 g packet Commonly known as: MIRALAX / GLYCOLAX Take 17 g by mouth daily.    rosuvastatin 10 MG tablet Commonly known as: CRESTOR Take 10 mg by mouth daily.   sitaGLIPtin-metformin 50-1000 MG tablet Commonly known as: JANUMET Take 1 tablet by mouth 2 (two) times daily with a meal.            Discharge Care Instructions  (From admission, onward)         Start     Ordered   05/04/20 0000  Change dressing on IV access line weekly and PRN  (Home infusion instructions - [facility] )     05/04/20 [facility] [place] Follow up.   Specialty: Home Health Services Contact information: [address] [place] [address] [phone]        Theodore Einstein, MD. Schedule an appointment as soon as possible for a visit in 2 week(s).   Specialty: Family Medicine Contact information: [address] [place] [address] [phone]        Burnell Blanks, MD .   Specialty: Cardiology Contact information: [address] [place] [address] [phone]          Allergies  Allergen Reactions  . Oxycodone Nausea And Vomiting    Consultations:  ID  Cardiology  Procedures/Studies: DG Chest 2 View  Result Date: 04/29/2020 CLINICAL DATA:  Cough and positive blood cultures EXAM: CHEST - 2 VIEW COMPARISON:  04/28/2020 FINDINGS: Postsurgical changes are again seen. Cardiac shadow is stable. The lungs are clear bilaterally. No acute bony abnormality is noted. IMPRESSION: No acute abnormality noted. Electronically Signed   By: Inez Catalina M.D.   On: 04/29/2020 16:52   ECHO TEE  Result Date: 05/01/2020    TRANSESOPHOGEAL ECHO REPORT   Patient Name:   MOHMED FARVER [facility] Date of Exam: 05/01/2020 Medical Rec #:  [id number]   Height:       67.5 in Accession #:    [id number]  Weight:       219.0 lb Date of Birth:  1946-12-06  BSA:          2.113 m Patient Age:    74 years    BP:           154/62 mmHg Patient Gender: M           HR:           67 bpm. Exam Location:   Inpatient Procedure: Transesophageal Echo, Color Doppler, Cardiac Doppler and 3D Echo Indications:     Bacteremia  History:         Patient has prior history of Echocardiogram examinations, most                  recent 05/30/2018. CAD; Risk Factors:Hypertension, Diabetes and                  Dyslipidemia.                  Aortic Valve: 25 mm  Edwards Magna Ease Bovine Pericardial                  Tissue valve is present in the aortic position. Procedure Date:                  10/26/2017.  Sonographer:     Mikki Santee RDCS (AE) Referring Phys:  [id number] Tami Lin [facility] Diagnosing Phys: Buford Dresser MD PROCEDURE: After discussion of the risks and benefits of a TEE, an informed consent was obtained. The transesophogeal probe was passed without difficulty through the esophogus of the patient. Sedation performed by different physician. The patient was monitored while under deep sedation. Anesthestetic sedation was provided intravenously by Anesthesiology: 377.52m of Propofol, 471mof Lidocaine. The patient developed no complications during the procedure. IMPRESSIONS  1. Left ventricular ejection fraction, by estimation, is 55 to 60%. The left ventricle has normal function. The left ventricle has no regional wall motion abnormalities.  2. Right ventricular systolic function is normal. The right ventricular size is normal.  3. No left atrial/left atrial appendage thrombus was detected.  4. The mitral valve is normal in structure. Trivial mitral valve regurgitation. No evidence of mitral stenosis.  5. No central regurgitation, trivial perivalvular regurgitation. There is significant thickening of one biosprosthetic leaflet, suggestive of endocarditis. No visualized perivalvular abscess. The aortic valve has been repaired/replaced. Aortic valve regurgitation is trivial. There is a 25 mm Edwards Magna Ease Bovine Pericardial Tissue valve present in the aortic position. Procedure Date: 10/26/2017. Echo findings  are consistent with thrombus/mass of the aortic prosthesis. Aortic valve mean gradient  measures 16.0 mmHg.  6. Pulmonic valve regurgitation not well visualized.  7. Aortic S/P bioprosthetic AVR. There is Severe (Grade IV) protruding and atheroma plaque involving the descending aorta. Conclusion(s)/Recommendation(s): Endocarditis of the bioprosthetic aortic valve. No large independently mobile mass or significant central regurgitation, but prominent thickening of a single bioprosthetic leaflet, best visualized in image 80. FINDINGS  Left Ventricle: Left ventricular ejection fraction, by estimation, is 55 to 60%. The left ventricle has normal function. The left ventricle has no regional wall motion abnormalities. The left ventricular internal cavity size was normal in size. There is  no left ventricular hypertrophy. Right Ventricle: The right ventricular size is normal. No increase in right ventricular wall thickness. Right ventricular systolic function is normal. Left Atrium: Left atrial size was not assessed. No left atrial/left atrial appendage thrombus was detected. Right Atrium: Right atrial size was not assessed. Prominent Chiari network. Pericardium: There is no evidence of pericardial effusion. Mitral Valve: The mitral valve is normal in structure. Trivial mitral valve regurgitation. No evidence of mitral valve stenosis. There is no evidence of mitral valve vegetation. Tricuspid Valve: The tricuspid valve is normal in structure. Tricuspid valve regurgitation is trivial. There is no evidence of tricuspid valve vegetation. Aortic Valve: No central regurgitation, trivial perivalvular regurgitation. There is significant thickening of one biosprosthetic leaflet, suggestive of endocarditis. No visualized perivalvular abscess. The aortic valve has been repaired/replaced. Aortic  valve regurgitation is trivial. Aortic valve mean gradient measures 16.0 mmHg. Aortic valve peak gradient measures 32.0 mmHg. There is a 25  mm Edwards Magna Ease Bovine Pericardial Tissue valve present in the aortic position. Procedure Date: 10/26/2017. Pulmonic Valve: Shadow from aortic valve makes evaluation of PV difficult. The pulmonic valve was not well visualized. Pulmonic valve regurgitation not well visualized. Aorta: S/P bioprosthetic AVR. There is severe (Grade IV) protruding and atheroma plaque involving the descending aorta. IAS/Shunts: No atrial level  shunt detected by color flow Doppler.  AORTIC VALVE AV Vmax:           283.00 cm/s AV Vmean:          191.000 cm/s AV VTI:            0.591 m AV Peak Grad:      32.0 mmHg AV Mean Grad:      16.0 mmHg LVOT Vmax:         177.00 cm/s LVOT Vmean:        123.000 cm/s LVOT VTI:          0.353 m LVOT/AV VTI ratio: 0.60 TRICUSPID VALVE TR Peak grad:   16.8 mmHg TR Vmax:        205.00 cm/s  SHUNTS Systemic VTI: 0.35 m Buford Dresser MD Electronically signed by Buford Dresser MD Signature Date/Time: 05/01/2020/11:43:02 AM    Final    [place] EKG SITE RITE  Result Date: 05/03/2020 If Site Rite image not attached, placement could not be confirmed due to current cardiac rhythm.    Subjective: Eager to go home  Discharge Exam: Vitals:   05/03/20 2250 05/04/20 0409  BP:  (!) 146/71  Pulse: 73 70  Resp: 16 17  Temp:  97.7 F (36.5 C)  SpO2: 98% 100%   Vitals:   05/03/20 1527 05/03/20 2009 05/03/20 2250 05/04/20 0409  BP: 118/66 117/64  (!) 146/71  Pulse: 73 64 73 70  Resp: 17 17 16 17   Temp: 98.6 F (37 C) 97.8 F (36.6 C)  97.7 F (36.5 C)  TempSrc: Oral Oral  Oral  SpO2: 99% 100% 98% 100%  Weight:      Height:        General: Pt is alert, awake, not in acute distress Cardiovascular: RRR, S1/S2 +, no rubs, no gallops, systolic murmur Respiratory: CTA bilaterally, no wheezing, no rhonchi Abdominal: Soft, NT, ND, bowel sounds + Extremities: no edema, no cyanosis   The results of significant diagnostics from this hospitalization (including imaging,  microbiology, ancillary and laboratory) are listed below for reference.     Microbiology: Recent Results (from the past 240 hour(s))  Urine culture     Status: None   Collection Time: 04/29/20  5:11 PM   Specimen: Urine, Clean Catch  Result Value Ref Range Status   Specimen Description URINE, CLEAN CATCH  Final   Special Requests NONE  Final   Culture   Final    NO GROWTH Performed at [facility], [address], [place], [place] [address]    Report Status 04/30/2020 FINAL  Final  Culture, blood (routine x 2)     Status: Abnormal   Collection Time: 04/29/20  5:42 PM   Specimen: BLOOD LEFT FOREARM  Result Value Ref Range Status   Specimen Description BLOOD LEFT FOREARM  Final   Special Requests   Final    BOTTLES DRAWN AEROBIC AND ANAEROBIC Blood Culture adequate volume   Culture  Setup Time   Final    IN BOTH AEROBIC AND ANAEROBIC BOTTLES GRAM POSITIVE COCCI CRITICAL VALUE NOTED.  VALUE IS CONSISTENT WITH PREVIOUSLY REPORTED AND CALLED VALUE.    Culture (A)  Final    STREPTOCOCCUS INFANTARIUS SUSCEPTIBILITIES PERFORMED ON PREVIOUS CULTURE WITHIN THE LAST 5 DAYS. Performed at [facility], [place] [address], [place], [place] [address]    Report Status 05/02/2020 FINAL  Final  Culture, blood (routine x 2)     Status: Abnormal   Collection  Time: 04/29/20  5:49 PM   Specimen: BLOOD  Result Value Ref Range Status   Specimen Description BLOOD RIGHT ANTECUBITAL  Final   Special Requests   Final    BOTTLES DRAWN AEROBIC AND ANAEROBIC Blood Culture adequate volume   Culture  Setup Time   Final    IN BOTH AEROBIC AND ANAEROBIC BOTTLES GRAM POSITIVE COCCI CRITICAL RESULT CALLED TO, READ BACK BY AND VERIFIED WITH: Maryann Alar [phone] [place] Performed at [facility], [place] [address], [place], [place] [address]    Culture STREPTOCOCCUS INFANTARIUS (A)  Final   Report Status 05/02/2020 FINAL  Final   Organism ID, Bacteria STREPTOCOCCUS INFANTARIUS  Final       Susceptibility   Streptococcus infantarius - MIC*    ERYTHROMYCIN <=0.12 SENSITIVE Sensitive     TETRACYCLINE 1 SENSITIVE Sensitive     VANCOMYCIN 0.5 SENSITIVE Sensitive     CLINDAMYCIN <=0.25 SENSITIVE Sensitive     CEFTRIAXONE Value in next row Sensitive      SENSITIVE<=0.12    PENICILLIN Value in next row Intermediate      INTERMEDIATE0.25    * STREPTOCOCCUS INFANTARIUS  Blood Culture ID Panel (Reflexed)     Status: Abnormal   Collection Time: 04/29/20  5:49 PM  Result Value Ref Range Status   Enterococcus species NOT DETECTED NOT DETECTED Final   Listeria monocytogenes NOT DETECTED NOT DETECTED Final   Staphylococcus species NOT DETECTED NOT DETECTED Final   Staphylococcus aureus (BCID) NOT DETECTED NOT DETECTED Final   Streptococcus species DETECTED (A) NOT DETECTED Final    Comment: Not Enterococcus species, Streptococcus agalactiae, Streptococcus pyogenes, or Streptococcus pneumoniae. CRITICAL RESULT CALLED TO, READ BACK BY AND VERIFIED WITH: PHARMD Gorden Harms [phone] [facility]    Streptococcus agalactiae NOT DETECTED NOT DETECTED Final   Streptococcus pneumoniae NOT DETECTED NOT DETECTED Final   Streptococcus pyogenes NOT DETECTED NOT DETECTED Final   Acinetobacter baumannii NOT DETECTED NOT DETECTED Final   Enterobacteriaceae species NOT DETECTED NOT DETECTED Final   Enterobacter cloacae complex NOT DETECTED NOT DETECTED Final   Escherichia coli NOT DETECTED NOT DETECTED Final   Klebsiella oxytoca NOT DETECTED NOT DETECTED Final   Klebsiella pneumoniae NOT DETECTED NOT DETECTED Final   Proteus species NOT DETECTED NOT DETECTED Final   Serratia marcescens NOT DETECTED NOT DETECTED Final   Haemophilus influenzae NOT DETECTED NOT DETECTED Final   Neisseria meningitidis NOT DETECTED NOT DETECTED Final   Pseudomonas aeruginosa NOT DETECTED NOT DETECTED Final   Candida albicans NOT DETECTED NOT DETECTED Final   Candida glabrata NOT DETECTED NOT DETECTED Final   Candida krusei  NOT DETECTED NOT DETECTED Final   Candida parapsilosis NOT DETECTED NOT DETECTED Final   Candida tropicalis NOT DETECTED NOT DETECTED Final    Comment: Performed at [facility], [place] [address], [place], [place] [address]  SARS Coronavirus 2 by RT PCR (hospital order, performed in [facility] hospital lab) Nasopharyngeal Nasopharyngeal Swab     Status: None   Collection Time: 04/29/20  8:02 PM   Specimen: Nasopharyngeal Swab  Result Value Ref Range Status   SARS Coronavirus 2 NEGATIVE NEGATIVE Final    Comment: (NOTE) SARS-CoV-2 target nucleic acids are NOT DETECTED. The SARS-CoV-2 RNA is generally detectable in upper and lower respiratory specimens during the acute phase of infection. The lowest concentration of SARS-CoV-2 viral copies this assay can detect is 250 copies / mL. A negative result does not preclude SARS-CoV-2 infection and should  not be used as the sole basis for treatment or other patient management decisions.  A negative result may occur with improper specimen collection / handling, submission of specimen other than nasopharyngeal swab, presence of viral mutation(s) within the areas targeted by this assay, and inadequate number of viral copies (<250 copies / mL). A negative result must be combined with clinical observations, patient history, and epidemiological information. Fact Sheet for Patients:   StrictlyIdeas.no Fact Sheet for Healthcare Providers: BankingDealers.co.za This test is not yet approved or cleared  by the [place] FDA and has been authorized for detection and/or diagnosis of SARS-CoV-2 by FDA under an Emergency Use Authorization (EUA).  This EUA will remain in effect (meaning this test can be used) for the duration of the COVID-19 declaration under Section 564(b)(1) of the Act, 21 U.S.C. section 360bbb-3(b)(1), unless the authorization is terminated or revoked sooner. Performed at [facility], [place] [address] [place], [place] [address]   Blood culture (routine x 2)     Status: Abnormal   Collection Time: 04/29/20 10:02 PM   Specimen: BLOOD LEFT HAND  Result Value Ref Range Status   Specimen Description BLOOD LEFT HAND  Final   Special Requests   Final    BOTTLES DRAWN AEROBIC AND ANAEROBIC Blood Culture adequate volume   Culture  Setup Time   Final    GRAM POSITIVE COCCI IN PAIRS IN CHAINS IN BOTH AEROBIC AND ANAEROBIC BOTTLES CRITICAL RESULT CALLED TO, READ BACK BY AND VERIFIED WITH: T BAUMEISTER,PHARMD AT 0946 04/30/20 BY L BENFIELD    Culture (A)  Final    STREPTOCOCCUS INFANTARIUS SUSCEPTIBILITIES PERFORMED ON PREVIOUS CULTURE WITHIN THE LAST 5 DAYS. Performed at [facility], [place] [address] [place], [place] [address]    Report Status 05/02/2020 FINAL  Final  Blood culture (routine x 2)     Status: Abnormal   Collection Time: 04/29/20 10:02 PM   Specimen: BLOOD  Result Value Ref Range Status   Specimen Description BLOOD LEFT ANTECUBITAL  Final   Special Requests   Final    BOTTLES DRAWN AEROBIC AND ANAEROBIC Blood Culture results may not be optimal due to an excessive volume of blood received in culture bottles   Culture  Setup Time   Final    GRAM POSITIVE COCCI IN PAIRS IN CHAINS IN BOTH AEROBIC AND ANAEROBIC BOTTLES CRITICAL VALUE NOTED.  VALUE IS CONSISTENT WITH PREVIOUSLY REPORTED AND CALLED VALUE.    Culture (A)  Final    STREPTOCOCCUS INFANTARIUS SUSCEPTIBILITIES PERFORMED ON PREVIOUS CULTURE WITHIN THE LAST 5 DAYS. Performed at [facility], [place] [address] [place], [place] [address]    Report Status 05/02/2020 FINAL  Final  Culture, blood (routine x 2)     Status: None (Preliminary result)   Collection Time: 05/01/20 10:38 AM   Specimen: BLOOD LEFT ARM  Result Value Ref Range Status   Specimen Description BLOOD LEFT ARM  Final   Special Requests   Final    BOTTLES DRAWN AEROBIC AND ANAEROBIC Blood Culture adequate volume    Culture   Final    NO GROWTH 3 DAYS Performed at [facility], [place] [address], [place],  [address]    Report Status PENDING  Incomplete  Culture, blood (routine x 2)     Status: None (Preliminary result)   Collection Time: 05/01/20 10:42 AM   Specimen: BLOOD RIGHT HAND  Result Value Ref Range Status   Specimen Description BLOOD RIGHT  HAND  Final   Special Requests   Final    BOTTLES DRAWN AEROBIC ONLY Blood Culture results may not be optimal due to an inadequate volume of blood received in culture bottles   Culture   Final    NO GROWTH 3 DAYS Performed at [facility], [place] [address], [place], [place] [address]    Report Status PENDING  Incomplete     Labs: BNP (last 3 results) No results for input(s): BNP in the last 8760 hours. Basic Metabolic Panel: Recent Labs  Lab 04/29/20 1129 05/01/20 0232 05/02/20 0656 05/04/20 0248  NA 128* 132* 132* 131*  K 3.8 4.1 4.0 5.1  CL 99 104 101 95*  CO2 20* 20* 23 26  GLUCOSE 195* 118* 115* 121*  BUN 17 13 11 15   CREATININE 1.19 0.86 0.94 1.11  CALCIUM 8.7* 8.5* 8.9 9.1   Liver Function Tests: Recent Labs  Lab 04/29/20 1129 05/01/20 0232 05/02/20 0656 05/04/20 0248  AST 44* 41 32 40  ALT 37 42 36 40  ALKPHOS 58 56 62 63  BILITOT 0.6 0.5 0.9 0.5  PROT 7.4 6.4* 6.9 7.3  ALBUMIN 3.4* 3.0* 3.1* 3.1*   No results for input(s): LIPASE, AMYLASE in the last 168 hours. No results for input(s): AMMONIA in the last 168 hours. CBC: Recent Labs  Lab 04/29/20 1129 05/01/20 0232 05/02/20 0656 05/04/20 0248  WBC 9.1 6.6 7.3 9.7  NEUTROABS  --   --  5.3  --   HGB 11.5* 10.8* 11.0* 11.2*  HCT 35.4* 32.5* 33.5* 33.9*  MCV 96.5 95.6 95.4 95.5  PLT 162 167 191 232   Cardiac Enzymes: No results for input(s): CKTOTAL, CKMB, CKMBINDEX, TROPONINI in the last 168 hours. BNP: Invalid input(s): POCBNP CBG: Recent Labs  Lab 05/03/20 1115 05/03/20 1657 05/03/20 2005 05/04/20 0737 05/04/20 1154  GLUCAP 144* 129*  96 112* 144*   D-Dimer No results for input(s): DDIMER in the last 72 hours. Hgb A1c No results for input(s): HGBA1C in the last 72 hours. Lipid Profile No results for input(s): CHOL, HDL, LDLCALC, TRIG, CHOLHDL, LDLDIRECT in the last 72 hours. Thyroid function studies No results for input(s): TSH, T4TOTAL, T3FREE, THYROIDAB in the last 72 hours.  Invalid input(s): FREET3 Anemia work up No results for input(s): VITAMINB12, FOLATE, FERRITIN, TIBC, IRON, RETICCTPCT in the last 72 hours. Urinalysis    Component Value Date/Time   COLORURINE YELLOW 04/29/2020 1710   APPEARANCEUR CLEAR 04/29/2020 1710   LABSPEC 1.015 04/29/2020 1710   PHURINE 5.0 04/29/2020 1710   GLUCOSEU NEGATIVE 04/29/2020 1710   HGBUR NEGATIVE 04/29/2020 1710   [place] 04/29/2020 1710   KETONESUR NEGATIVE 04/29/2020 1710   PROTEINUR NEGATIVE 04/29/2020 1710   NITRITE NEGATIVE 04/29/2020 1710   LEUKOCYTESUR NEGATIVE 04/29/2020 1710   Sepsis Labs Invalid input(s): PROCALCITONIN,  WBC,  LACTICIDVEN Microbiology Recent Results (from the past 240 hour(s))  Urine culture     Status: None   Collection Time: 04/29/20  5:11 PM   Specimen: Urine, Clean Catch  Result Value Ref Range Status   Specimen Description URINE, CLEAN CATCH  Final   Special Requests NONE  Final   Culture   Final    NO GROWTH Performed at [facility], [place] [address], [place], [place] [address]    Report Status 04/30/2020 FINAL  Final  Culture, blood (routine x 2)     Status: Abnormal   Collection Time: 04/29/20  5:42 PM   Specimen: BLOOD  LEFT FOREARM  Result Value Ref Range Status   Specimen Description BLOOD LEFT FOREARM  Final   Special Requests   Final    BOTTLES DRAWN AEROBIC AND ANAEROBIC Blood Culture adequate volume   Culture  Setup Time   Final    IN BOTH AEROBIC AND ANAEROBIC BOTTLES GRAM POSITIVE COCCI CRITICAL VALUE NOTED.  VALUE IS CONSISTENT WITH PREVIOUSLY REPORTED AND CALLED VALUE.    Culture (A)   Final    STREPTOCOCCUS INFANTARIUS SUSCEPTIBILITIES PERFORMED ON PREVIOUS CULTURE WITHIN THE LAST 5 DAYS. Performed at [facility], [place] [address], [place], [place] [address]    Report Status 05/02/2020 FINAL  Final  Culture, blood (routine x 2)     Status: Abnormal   Collection Time: 04/29/20  5:49 PM   Specimen: BLOOD  Result Value Ref Range Status   Specimen Description BLOOD RIGHT ANTECUBITAL  Final   Special Requests   Final    BOTTLES DRAWN AEROBIC AND ANAEROBIC Blood Culture adequate volume   Culture  Setup Time   Final    IN BOTH AEROBIC AND ANAEROBIC BOTTLES GRAM POSITIVE COCCI CRITICAL RESULT CALLED TO, READ BACK BY AND VERIFIED WITH: Maryann Alar [phone] Mims Performed at [facility], [place] [address], [place], [place] [address]    Culture STREPTOCOCCUS INFANTARIUS (A)  Final   Report Status 05/02/2020 FINAL  Final   Organism ID, Bacteria STREPTOCOCCUS INFANTARIUS  Final      Susceptibility   Streptococcus infantarius - MIC*    ERYTHROMYCIN <=0.12 SENSITIVE Sensitive     TETRACYCLINE 1 SENSITIVE Sensitive     VANCOMYCIN 0.5 SENSITIVE Sensitive     CLINDAMYCIN <=0.25 SENSITIVE Sensitive     CEFTRIAXONE Value in next row Sensitive      SENSITIVE<=0.12    PENICILLIN Value in next row Intermediate      INTERMEDIATE0.25    * STREPTOCOCCUS INFANTARIUS  Blood Culture ID Panel (Reflexed)     Status: Abnormal   Collection Time: 04/29/20  5:49 PM  Result Value Ref Range Status   Enterococcus species NOT DETECTED NOT DETECTED Final   Listeria monocytogenes NOT DETECTED NOT DETECTED Final   Staphylococcus species NOT DETECTED NOT DETECTED Final   Staphylococcus aureus (BCID) NOT DETECTED NOT DETECTED Final   Streptococcus species DETECTED (A) NOT DETECTED Final    Comment: Not Enterococcus species, Streptococcus agalactiae, Streptococcus pyogenes, or Streptococcus pneumoniae. CRITICAL RESULT CALLED TO, READ BACK BY AND VERIFIED WITH: PHARMD Gorden Harms [phone]  [id number] FCP    Streptococcus agalactiae NOT DETECTED NOT DETECTED Final   Streptococcus pneumoniae NOT DETECTED NOT DETECTED Final   Streptococcus pyogenes NOT DETECTED NOT DETECTED Final   Acinetobacter baumannii NOT DETECTED NOT DETECTED Final   Enterobacteriaceae species NOT DETECTED NOT DETECTED Final   Enterobacter cloacae complex NOT DETECTED NOT DETECTED Final   Escherichia coli NOT DETECTED NOT DETECTED Final   Klebsiella oxytoca NOT DETECTED NOT DETECTED Final   Klebsiella pneumoniae NOT DETECTED NOT DETECTED Final   Proteus species NOT DETECTED NOT DETECTED Final   Serratia marcescens NOT DETECTED NOT DETECTED Final   Haemophilus influenzae NOT DETECTED NOT DETECTED Final   Neisseria meningitidis NOT DETECTED NOT DETECTED Final   Pseudomonas aeruginosa NOT DETECTED NOT DETECTED Final   Candida albicans NOT DETECTED NOT DETECTED Final   Candida glabrata NOT DETECTED NOT DETECTED Final   Candida krusei NOT DETECTED NOT DETECTED Final   Candida parapsilosis NOT DETECTED NOT DETECTED Final   Candida tropicalis  NOT DETECTED NOT DETECTED Final    Comment: Performed at [facility], [place] [address], [place], [place] [address]  SARS Coronavirus 2 by RT PCR (hospital order, performed in [facility] hospital lab) Nasopharyngeal Nasopharyngeal Swab     Status: None   Collection Time: 04/29/20  8:02 PM   Specimen: Nasopharyngeal Swab  Result Value Ref Range Status   SARS Coronavirus 2 NEGATIVE NEGATIVE Final    Comment: (NOTE) SARS-CoV-2 target nucleic acids are NOT DETECTED. The SARS-CoV-2 RNA is generally detectable in upper and lower respiratory specimens during the acute phase of infection. The lowest concentration of SARS-CoV-2 viral copies this assay can detect is 250 copies / mL. A negative result does not preclude SARS-CoV-2 infection and should not be used as the sole basis for treatment or other patient management decisions.  A negative result may occur with improper  specimen collection / handling, submission of specimen other than nasopharyngeal swab, presence of viral mutation(s) within the areas targeted by this assay, and inadequate number of viral copies (<250 copies / mL). A negative result must be combined with clinical observations, patient history, and epidemiological information. Fact Sheet for Patients:   StrictlyIdeas.no Fact Sheet for Healthcare Providers: BankingDealers.co.za This test is not yet approved or cleared  by the [place] FDA and has been authorized for detection and/or diagnosis of SARS-CoV-2 by FDA under an Emergency Use Authorization (EUA).  This EUA will remain in effect (meaning this test can be used) for the duration of the COVID-19 declaration under Section 564(b)(1) of the Act, 21 U.S.C. section 360bbb-3(b)(1), unless the authorization is terminated or revoked sooner. Performed at [facility], [place] [address] [place], [place] [address]   Blood culture (routine x 2)     Status: Abnormal   Collection Time: 04/29/20 10:02 PM   Specimen: BLOOD LEFT HAND  Result Value Ref Range Status   Specimen Description BLOOD LEFT HAND  Final   Special Requests   Final    BOTTLES DRAWN AEROBIC AND ANAEROBIC Blood Culture adequate volume   Culture  Setup Time   Final    GRAM POSITIVE COCCI IN PAIRS IN CHAINS IN BOTH AEROBIC AND ANAEROBIC BOTTLES CRITICAL RESULT CALLED TO, READ BACK BY AND VERIFIED WITH: T BAUMEISTER,PHARMD AT 0946 04/30/20 BY L BENFIELD    Culture (A)  Final    STREPTOCOCCUS INFANTARIUS SUSCEPTIBILITIES PERFORMED ON PREVIOUS CULTURE WITHIN THE LAST 5 DAYS. Performed at [facility], [place] [address] [place], [place] [address]    Report Status 05/02/2020 FINAL  Final  Blood culture (routine x 2)     Status: Abnormal   Collection Time: 04/29/20 10:02 PM   Specimen: BLOOD  Result Value Ref Range Status   Specimen Description BLOOD LEFT ANTECUBITAL   Final   Special Requests   Final    BOTTLES DRAWN AEROBIC AND ANAEROBIC Blood Culture results may not be optimal due to an excessive volume of blood received in culture bottles   Culture  Setup Time   Final    GRAM POSITIVE COCCI IN PAIRS IN CHAINS IN BOTH AEROBIC AND ANAEROBIC BOTTLES CRITICAL VALUE NOTED.  VALUE IS CONSISTENT WITH PREVIOUSLY REPORTED AND CALLED VALUE.    Culture (A)  Final    STREPTOCOCCUS INFANTARIUS SUSCEPTIBILITIES PERFORMED ON PREVIOUS CULTURE WITHIN THE LAST 5 DAYS. Performed at [facility], [place] [address] [place], [place] [address]    Report Status 05/02/2020 FINAL  Final  Culture, blood (routine x  2)     Status: None (Preliminary result)   Collection Time: 05/01/20 10:38 AM   Specimen: BLOOD LEFT ARM  Result Value Ref Range Status   Specimen Description BLOOD LEFT ARM  Final   Special Requests   Final    BOTTLES DRAWN AEROBIC AND ANAEROBIC Blood Culture adequate volume   Culture   Final    NO GROWTH 3 DAYS Performed at [facility], [address], [place], [place] [address]    Report Status PENDING  Incomplete  Culture, blood (routine x 2)     Status: None (Preliminary result)   Collection Time: 05/01/20 10:42 AM   Specimen: BLOOD RIGHT HAND  Result Value Ref Range Status   Specimen Description BLOOD RIGHT HAND  Final   Special Requests   Final    BOTTLES DRAWN AEROBIC ONLY Blood Culture results may not be optimal due to an inadequate volume of blood received in culture bottles   Culture   Final    NO GROWTH 3 DAYS Performed at [facility], [place] [address], [place], [place] [address]    Report Status PENDING  Incomplete   Time spent: 30 min  SIGNED:   Marylu Lund, MD  [facility] 05/04/2020, 2:18 PM  If 7PM-7AM, please contact night-coverage

## 2020-05-04 NOTE — Care Management (Addendum)
Still working on Consulting civil engineer .    [facility] unable to accept.   [facility] unable to accept.  Hoyle Sauer with [facility] not in network with insurance.  Drew with [facility] not accepting patient's insurance at this time.   Cheryl with [facility] not in network with insurance.   Tillie Rung with [facility] accepted referral. Start of care will be 05/05/20 at 0800 to 0900. They will call patient and wife to arrange.   Referral faxed to [facility] at [phone]. Discharge summary faxed   Requested [facility] order and face to face.   Pam with [facility] aware and will see patient and wife shortly prior to discharge.    Magdalen Spatz

## 2020-05-04 NOTE — Progress Notes (Addendum)
PHARMACY CONSULT NOTE FOR:  OUTPATIENT  PARENTERAL ANTIBIOTIC THERAPY (OPAT)  Indication: endocarditis Regimen: Ceftriaxone 2g IV q24 + gent 240mg  IV q24 End date: 06/11/20  IV antibiotic discharge orders are pended. To discharging provider:  please sign these orders via discharge navigator,  Select New Orders & click on the button choice - Manage This Unsigned Work.    OPAT Order Details            .Outpatient Parenteral Antibiotic Therapy Consult  Until discontinued    Provider:  (Not yet assigned)  Question Answer Comment  Antibiotic Ceftriaxone (Rocephin) IVPB   Antibiotic Gentamycin (Garamycin) IVPB   Indications for use endocarditis, per ID   End Date 06/11/2020               Onnie Boer, PharmD, BCIDP, AAHIVP, CPP Infectious Disease Pharmacist 05/04/2020 11:07 AM

## 2020-05-05 DIAGNOSIS — K2270 Barrett's esophagus without dysplasia: Secondary | ICD-10-CM | POA: Diagnosis not present

## 2020-05-05 DIAGNOSIS — Z792 Long term (current) use of antibiotics: Secondary | ICD-10-CM | POA: Diagnosis not present

## 2020-05-05 DIAGNOSIS — R7881 Bacteremia: Secondary | ICD-10-CM | POA: Diagnosis not present

## 2020-05-05 DIAGNOSIS — E785 Hyperlipidemia, unspecified: Secondary | ICD-10-CM | POA: Diagnosis not present

## 2020-05-05 DIAGNOSIS — Z87891 Personal history of nicotine dependence: Secondary | ICD-10-CM | POA: Diagnosis not present

## 2020-05-05 DIAGNOSIS — K219 Gastro-esophageal reflux disease without esophagitis: Secondary | ICD-10-CM | POA: Diagnosis not present

## 2020-05-05 DIAGNOSIS — G4733 Obstructive sleep apnea (adult) (pediatric): Secondary | ICD-10-CM | POA: Diagnosis not present

## 2020-05-05 DIAGNOSIS — I2510 Atherosclerotic heart disease of native coronary artery without angina pectoris: Secondary | ICD-10-CM | POA: Diagnosis not present

## 2020-05-05 DIAGNOSIS — Z7984 Long term (current) use of oral hypoglycemic drugs: Secondary | ICD-10-CM | POA: Diagnosis not present

## 2020-05-05 DIAGNOSIS — E119 Type 2 diabetes mellitus without complications: Secondary | ICD-10-CM | POA: Diagnosis not present

## 2020-05-05 DIAGNOSIS — B9689 Other specified bacterial agents as the cause of diseases classified elsewhere: Secondary | ICD-10-CM | POA: Diagnosis not present

## 2020-05-05 DIAGNOSIS — Z7982 Long term (current) use of aspirin: Secondary | ICD-10-CM | POA: Diagnosis not present

## 2020-05-05 DIAGNOSIS — I330 Acute and subacute infective endocarditis: Secondary | ICD-10-CM | POA: Diagnosis not present

## 2020-05-05 DIAGNOSIS — Z6831 Body mass index (BMI) 31.0-31.9, adult: Secondary | ICD-10-CM | POA: Diagnosis not present

## 2020-05-05 DIAGNOSIS — Z452 Encounter for adjustment and management of vascular access device: Secondary | ICD-10-CM | POA: Diagnosis not present

## 2020-05-05 DIAGNOSIS — T826XXD Infection and inflammatory reaction due to cardiac valve prosthesis, subsequent encounter: Secondary | ICD-10-CM | POA: Diagnosis not present

## 2020-05-05 DIAGNOSIS — I10 Essential (primary) hypertension: Secondary | ICD-10-CM | POA: Diagnosis not present

## 2020-05-05 DIAGNOSIS — E669 Obesity, unspecified: Secondary | ICD-10-CM | POA: Diagnosis not present

## 2020-05-05 NOTE — Progress Notes (Signed)
    [facility] for Infectious Disease    Date of Admission:  04/29/2020     ID: Theodore Jenkins is a 74 y.o. male with streptococcal prosthetic valve endocarditis Principal Problem:   Bacteremia Active Problems:   Coronary artery disease involving native coronary artery of native heart without angina pectoris   Hyperlipidemia LDL goal <70   Essential hypertension   Hyponatremia   Acute bacterial endocarditis    Subjective: Afebrile, picc line in place. Medications:    Objective: Vital signs in last 24 hours: Temp:  [98.4 F (36.9 C)] 98.4 F (36.9 C) (05/24 1434) Pulse Rate:  [70] 70 (05/24 1434) Resp:  [18] 18 (05/24 1434) BP: (103)/(90) 103/90 (05/24 1434) SpO2:  [97 %] 97 % (05/24 1434)  Physical Exam  Constitutional: He is oriented to person, place, and time. He appears well-developed and well-nourished. No distress.  HENT:  Mouth/Throat: Oropharynx is clear and moist. No oropharyngeal exudate.  Cardiovascular: Normal rate, regular rhythm and normal heart sounds. Exam reveals no gallop and no friction rub.  No murmur heard.  Pulmonary/Chest: Effort normal and breath sounds normal. No respiratory distress. He has no wheezes.  Abdominal: Soft. Bowel sounds are normal. He exhibits no distension. There is no tenderness.  Lymphadenopathy:  He has no cervical adenopathy.  Neurological: He is alert and oriented to person, place, and time.  Skin: Skin is warm and dry. No rash noted. No erythema.  Psychiatric: He has a normal mood and affect. His behavior is normal.     Lab Results Recent Labs    05/04/20 0248  WBC 9.7  HGB 11.2*  HCT 33.9*  NA 131*  K 5.1  CL 95*  CO2 26  BUN 15  CREATININE 1.11   Liver Panel Recent Labs    05/04/20 0248  PROT 7.3  ALBUMIN 3.1*  AST 40  ALT 40  ALKPHOS 63  BILITOT 0.5   Sedimentation Rate No results for input(s): ESRSEDRATE in the last 72 hours. C-Reactive Protein No results for input(s): CRP in the last 72  hours.  Microbiology: 5/21 blood cx ngtd 5/19 strep infantarius (PCN I) Studies/Results: [place] EKG SITE RITE  Result Date: 05/03/2020 If Site Rite image not attached, placement could not be confirmed due to current cardiac rhythm.    Assessment/Plan: Streptococcal infantarius prosthetic valve endocarditis = plan to treat for 6 wk with iv ceftriaxone plus gentamicin. See pharmacy opat note for end date. Will need twice a week cr check to ensure not having any aki from gent  Recommended to follow up for GI to do colonoscopy Also to follow with up with cardiology at the end of care for repeat TEE Will see back in the [facility] in 2-3 for telephone visit then at end of treatment  [facility] for Infectious Diseases Cell: [phone] Pager: [phone]  05/05/2020, 8:46 AM

## 2020-05-06 LAB — CULTURE, BLOOD (ROUTINE X 2)
Culture: NO GROWTH
Culture: NO GROWTH
Special Requests: ADEQUATE

## 2020-05-07 DIAGNOSIS — T826XXD Infection and inflammatory reaction due to cardiac valve prosthesis, subsequent encounter: Secondary | ICD-10-CM | POA: Diagnosis not present

## 2020-05-07 DIAGNOSIS — R7881 Bacteremia: Secondary | ICD-10-CM | POA: Diagnosis not present

## 2020-05-07 DIAGNOSIS — Z792 Long term (current) use of antibiotics: Secondary | ICD-10-CM | POA: Diagnosis not present

## 2020-05-07 DIAGNOSIS — I330 Acute and subacute infective endocarditis: Secondary | ICD-10-CM | POA: Diagnosis not present

## 2020-05-12 DIAGNOSIS — E119 Type 2 diabetes mellitus without complications: Secondary | ICD-10-CM | POA: Diagnosis not present

## 2020-05-12 DIAGNOSIS — I330 Acute and subacute infective endocarditis: Secondary | ICD-10-CM | POA: Diagnosis not present

## 2020-05-12 DIAGNOSIS — R7881 Bacteremia: Secondary | ICD-10-CM | POA: Diagnosis not present

## 2020-05-12 DIAGNOSIS — T826XXD Infection and inflammatory reaction due to cardiac valve prosthesis, subsequent encounter: Secondary | ICD-10-CM | POA: Diagnosis not present

## 2020-05-12 DIAGNOSIS — Z792 Long term (current) use of antibiotics: Secondary | ICD-10-CM | POA: Diagnosis not present

## 2020-05-13 ENCOUNTER — Telehealth: Payer: Self-pay | Admitting: Pharmacist

## 2020-05-13 NOTE — Telephone Encounter (Signed)
Theodore Jenkins, pharmacist at [facility], called with concerns for patient's labs. Patient is currently on ceftriaxone + gentamicin until 7/1 for prosthetic atrial valve endocarditis.  Patient's gent trough was 1.0 yesterday (goal <1) and SCr has bumped up to 1.2 (1.1 when last checked).  Discussed with Raquel Sarna, inpatient ID pharmacist, and told Theodore Jenkins to decrease patient's dose from 240 mg daily to 200 mg daily. Repeat labs will be drawn in the next couple of days.

## 2020-05-14 DIAGNOSIS — K2270 Barrett's esophagus without dysplasia: Secondary | ICD-10-CM | POA: Diagnosis not present

## 2020-05-14 DIAGNOSIS — K219 Gastro-esophageal reflux disease without esophagitis: Secondary | ICD-10-CM | POA: Diagnosis not present

## 2020-05-14 DIAGNOSIS — R7881 Bacteremia: Secondary | ICD-10-CM | POA: Diagnosis not present

## 2020-05-14 DIAGNOSIS — A491 Streptococcal infection, unspecified site: Secondary | ICD-10-CM | POA: Diagnosis not present

## 2020-05-14 DIAGNOSIS — I330 Acute and subacute infective endocarditis: Secondary | ICD-10-CM | POA: Diagnosis not present

## 2020-05-15 DIAGNOSIS — I330 Acute and subacute infective endocarditis: Secondary | ICD-10-CM | POA: Diagnosis not present

## 2020-05-15 DIAGNOSIS — T826XXD Infection and inflammatory reaction due to cardiac valve prosthesis, subsequent encounter: Secondary | ICD-10-CM | POA: Diagnosis not present

## 2020-05-15 DIAGNOSIS — R7881 Bacteremia: Secondary | ICD-10-CM | POA: Diagnosis not present

## 2020-05-19 ENCOUNTER — Other Ambulatory Visit: Payer: Self-pay

## 2020-05-19 ENCOUNTER — Telehealth (INDEPENDENT_AMBULATORY_CARE_PROVIDER_SITE_OTHER): Payer: PPO | Admitting: Internal Medicine

## 2020-05-19 ENCOUNTER — Telehealth: Payer: Self-pay | Admitting: *Deleted

## 2020-05-19 DIAGNOSIS — N179 Acute kidney failure, unspecified: Secondary | ICD-10-CM

## 2020-05-19 DIAGNOSIS — T826XXD Infection and inflammatory reaction due to cardiac valve prosthesis, subsequent encounter: Secondary | ICD-10-CM | POA: Diagnosis not present

## 2020-05-19 DIAGNOSIS — Z5181 Encounter for therapeutic drug level monitoring: Secondary | ICD-10-CM

## 2020-05-19 DIAGNOSIS — R7881 Bacteremia: Secondary | ICD-10-CM | POA: Diagnosis not present

## 2020-05-19 DIAGNOSIS — I38 Endocarditis, valve unspecified: Secondary | ICD-10-CM

## 2020-05-19 DIAGNOSIS — I330 Acute and subacute infective endocarditis: Secondary | ICD-10-CM | POA: Diagnosis not present

## 2020-05-19 NOTE — Telephone Encounter (Signed)
Received call from home health nurse Erline Levine, [facility] [phone]). Arm circumference 32.5 cm (31.5 cm last week). No redness, no pain, no obvious swelling, no chest pain or pressure. Patient has virtual visit today 1:45 with Dr Baxter Flattery. Will notify her. Landis Gandy, RN

## 2020-05-19 NOTE — Progress Notes (Signed)
Virtual Visit via Telephone Note  I connected with Theodore Jenkins on 05/19/20 at  1:45 PM EDT by telephone and verified that I am speaking with the correct person using two identifiers.  Location: Patient: at home Provider: at clinic   I discussed the limitations, risks, security and privacy concerns of performing an evaluation and management service by telephone and the availability of in person appointments. I also discussed with the patient that there may be a patient responsible charge related to this service. The patient expressed understanding and agreed to proceed.   History of Present Illness: Doing well with infusion. Labs show that on 6/4 - cr is 1.3 & gent trough 1.1 & Na 132  / labs on 6/8 : gent <0.6, and cr 1.7, Na 131.   Observations/Objective: No fever, chills picc line not causing any issues No diarrhea/no rahs Reviewed labs with patient Assessment and Plan:  Continue ceftriaxone as monotherapy Stop gentamicin, will let home health pharmacy know Check labs thur/friday Change to weekly labs   Follow Up Instructions: In 2-3 wk by phones; increase water intake over the next 2-3 days   I discussed the assessment and treatment plan with the patient. The patient was provided an opportunity to ask questions and all were answered. The patient agreed with the plan and demonstrated an understanding of the instructions.   The patient was advised to call back or seek an in-person evaluation if the symptoms worsen or if the condition fails to improve as anticipated.  I provided 20 minutes of non-face-to-face time during this encounter.   Carlyle Basques, MD

## 2020-05-19 NOTE — Telephone Encounter (Signed)
Per Dr Baxter Flattery, relayed verbal orders to [facility] to stop Gentamycin, repeat creatinine on Thursday or Friday, and to continue with ceftriaxone and associated labs alone through current completion date. Orders repeated and verified.  Patient will follow up with Dr Baxter Flattery 6/29, video/telephone virtual visit. Landis Gandy, RN

## 2020-05-21 DIAGNOSIS — T826XXD Infection and inflammatory reaction due to cardiac valve prosthesis, subsequent encounter: Secondary | ICD-10-CM | POA: Diagnosis not present

## 2020-05-21 DIAGNOSIS — R7881 Bacteremia: Secondary | ICD-10-CM | POA: Diagnosis not present

## 2020-05-21 DIAGNOSIS — I330 Acute and subacute infective endocarditis: Secondary | ICD-10-CM | POA: Diagnosis not present

## 2020-05-25 DIAGNOSIS — R7881 Bacteremia: Secondary | ICD-10-CM | POA: Diagnosis not present

## 2020-05-25 DIAGNOSIS — T826XXD Infection and inflammatory reaction due to cardiac valve prosthesis, subsequent encounter: Secondary | ICD-10-CM | POA: Diagnosis not present

## 2020-05-25 DIAGNOSIS — I330 Acute and subacute infective endocarditis: Secondary | ICD-10-CM | POA: Diagnosis not present

## 2020-05-26 ENCOUNTER — Telehealth: Payer: Self-pay

## 2020-05-26 DIAGNOSIS — I330 Acute and subacute infective endocarditis: Secondary | ICD-10-CM | POA: Diagnosis not present

## 2020-05-26 NOTE — Telephone Encounter (Signed)
Received call from [facility] about Cr decreasing after stopping gentamycin; only getting ceftriaxone. Jeani Hawking wanted to know if it was okay to get weekly labs instead of two times a week. Will forward to pharmacy and provider.  Marinus Eicher Lorita Officer, RN

## 2020-05-27 DIAGNOSIS — E871 Hypo-osmolality and hyponatremia: Secondary | ICD-10-CM | POA: Diagnosis not present

## 2020-05-27 DIAGNOSIS — K5909 Other constipation: Secondary | ICD-10-CM | POA: Diagnosis not present

## 2020-05-27 DIAGNOSIS — I330 Acute and subacute infective endocarditis: Secondary | ICD-10-CM | POA: Diagnosis not present

## 2020-05-27 DIAGNOSIS — D509 Iron deficiency anemia, unspecified: Secondary | ICD-10-CM | POA: Diagnosis not present

## 2020-05-27 DIAGNOSIS — E118 Type 2 diabetes mellitus with unspecified complications: Secondary | ICD-10-CM | POA: Diagnosis not present

## 2020-05-27 DIAGNOSIS — R7881 Bacteremia: Secondary | ICD-10-CM | POA: Diagnosis not present

## 2020-06-01 DIAGNOSIS — B9689 Other specified bacterial agents as the cause of diseases classified elsewhere: Secondary | ICD-10-CM | POA: Diagnosis not present

## 2020-06-01 DIAGNOSIS — T826XXD Infection and inflammatory reaction due to cardiac valve prosthesis, subsequent encounter: Secondary | ICD-10-CM | POA: Diagnosis not present

## 2020-06-01 DIAGNOSIS — Z7902 Long term (current) use of antithrombotics/antiplatelets: Secondary | ICD-10-CM | POA: Diagnosis not present

## 2020-06-01 DIAGNOSIS — I330 Acute and subacute infective endocarditis: Secondary | ICD-10-CM | POA: Diagnosis not present

## 2020-06-01 DIAGNOSIS — I10 Essential (primary) hypertension: Secondary | ICD-10-CM | POA: Diagnosis not present

## 2020-06-02 DIAGNOSIS — I330 Acute and subacute infective endocarditis: Secondary | ICD-10-CM | POA: Diagnosis not present

## 2020-06-03 NOTE — Progress Notes (Signed)
Cardiology Office Note    Date:  06/09/2020   ID:  Theodore Jenkins, DOB 1946/03/21, MRN [id number]  PCP:  Theodore Einstein, MD  Cardiologist: Theodore Chandler, MD EPS: None  Chief Complaint  Patient presents with  . Hospitalization Follow-up    History of Present Illness:  Theodore Jenkins is a 74 y.o. male with medical history significant of severe aortic stenosis status post bioprosthetic aortic valve replacement in 2018, CAD status post PCI LAD 2005 and CABG 2018 LIMA-LAD, SVG-OM,CVA s/p right carotid endarterectomy with subsequent right internal carotid artery occlusion, Barrett's esophagitis, AAA   diabetes, hypertension, hyperlipidemia, GERD, obesity (BMI 33.79), OSA on CPAP.  Presented to the ED 04/29/20 for evaluation of fevers and malaise and blood cultures done 5/18 grew gram positive cocci-Strep infantarius.  TEE 05/01/20 significant thickening of one bioprosthetic leaflet suggestive of endocarditis on IV Rocephin and gentamicin through 06/11/20.  Patient comes in accompanied by his wife. He is doing well. Gentamicin stopped due to kidney problems. Denies chest pain, palpitations, dyspnea, dizziness or presyncope. Gets tired easily-walking up stairs.     Past Medical History:  Diagnosis Date  . Aortic stenosis    Echo (08/2017): Severe AS with mean gradient of 42 mmHg.  . Barrett's esophagus   . CAD (coronary artery disease) 06/06/2011   Remote stent to the LAD in 2005 // Last cath in 2010 showing diffuse CAD, small PD that is occluded and fills with left to right collaterals, 60-70% 1st OM, with normal EF. Managed medically // Myoview 3/18:  Normal perfusion. LVEF 55% with normal wall motion. This is a low risk study.  . Complication of anesthesia   . Diabetes mellitus   . ED (erectile dysfunction)   . Family history of adverse reaction to anesthesia 10/23/2017   sister had history of malignant hyperthermia when she was 2, she is 72 years old now  . GERD (gastroesophageal  reflux disease)   . Hyperlipidemia   . Hypertension   . IHD (ischemic heart disease)    prior stenting of the LAD in December of 2005; with last cath in 2010 showing diffuse CAD with normal LV function. He is managed medically.   . Malignant hyperthermia    sister had reaction concerning for malignant hyperthermia (~ 1988)  . Neuromuscular disorder ([place]) 10/23/2017   lumbar 4 disc in back causing nerve pain, on gabapentin  . Obesity   . Obstructive sleep apnea on CPAP   . PONV (postoperative nausea and vomiting)   . S/P aortic valve replacement with bioprosthetic valve 10/26/2017   25 mm [facility] Ease bovine pericardial tissue valve  . S/P coronary artery stent placement 2005   LAD    Past Surgical History:  Procedure Laterality Date  . AORTIC VALVE REPLACEMENT N/A 10/26/2017   Procedure: AORTIC VALVE REPLACEMENT (AVR), using Magna Ease 25;  Surgeon: Rexene Alberts, MD;  Location: [facility];  Service: Open Heart Surgery;  Laterality: N/A;  . CARDIAC CATHETERIZATION  2010   NORMAL LV FUNCTION. DIFFUSE CORONARY DISEASE WITH THE DISTAL PORTION OF THE SMALL POSTERIOR DESCENDING VESSEL OCCLUDED AND FILLING WITH THE RIGHT COLLATERALS, 60-70% STENOSIS IN THE FIRST OBTUSE MARGINAL VESSEL AND DIFFUSE SMALL VESSEL DISEASE  . CAROTID ENDARTERECTOMY    . CIRCUMCISION  1985  . CORONARY ARTERY BYPASS GRAFT N/A 10/26/2017   Procedure: CORONARY ARTERY BYPASS GRAFTING (CABG) x two , using left internal mammary artery and right leg greater saphenous vein harvested endoscopically;  Surgeon: Darylene Price  H, MD;  Location: [facility];  Service: Open Heart Surgery;  Laterality: N/A;  . CORONARY STENT PLACEMENT  2005  . RIGHT/LEFT HEART CATH AND CORONARY ANGIOGRAPHY N/A 08/25/2017   Procedure: RIGHT/LEFT HEART CATH AND CORONARY ANGIOGRAPHY;  Surgeon: Nelva Bush, MD;  Location: [facility];  Service: Cardiovascular;  Laterality: N/A;  . rotator cuff surgery Right   . TEE WITHOUT CARDIOVERSION N/A  10/26/2017   Procedure: TRANSESOPHAGEAL ECHOCARDIOGRAM (TEE);  Surgeon: Rexene Alberts, MD;  Location: [facility];  Service: Open Heart Surgery;  Laterality: N/A;  . TEE WITHOUT CARDIOVERSION N/A 05/01/2020   Procedure: TRANSESOPHAGEAL ECHOCARDIOGRAM (TEE);  Surgeon: Theodore Dresser, MD;  Location: [facility] ENDOSCOPY;  Service: Cardiovascular;  Laterality: N/A;    Current Medications: Current Meds  Medication Sig  . acetaminophen (TYLENOL) 500 MG tablet Take 1,000 mg by mouth every 8 (eight) hours as needed for headache.  [name] aspirin 81 MG EC tablet Take 1 tablet (81 mg total) by mouth daily.  . cefTRIAXone (ROCEPHIN) IVPB Inject 2 g into the vein daily. Indication:  endocarditis First Dose: No Last Day of Therapy:  06/11/20 Labs - Once weekly:  CBC/D and BMP Labs - Every other week:  ESR and CRP Method of administration: IV Push Method of administration may be changed at the discretion of home infusion pharmacist based upon assessment of the patient and/or caregiver's ability to self-administer the medication ordered.  . fluticasone (FLONASE) 50 MCG/ACT nasal spray Place 2 sprays into both nostrils daily as needed for allergies or rhinitis.   [name] gabapentin (NEURONTIN) 300 MG capsule Take 600 mg by mouth at bedtime.   [name] lisinopril (ZESTRIL) 5 MG tablet Take 1 tablet (5 mg total) by mouth daily. Please keep upcoming appt in June for future refills. Thank you  . loratadine (CLARITIN) 10 MG tablet Take 10 mg by mouth daily.  . metoprolol succinate (TOPROL-XL) 50 MG 24 hr tablet Take 50 mg by mouth daily. Take with or immediately following a meal.  . Multiple Vitamins-Minerals (ONE-A-DAY 50 PLUS PO) Take 1 tablet by mouth daily.  [name] omeprazole (PRILOSEC OTC) 20 MG tablet Take 20 mg by mouth daily.    Glory Rosebush VERIO test strip TEST BS ONCE D  . polyethylene glycol (MIRALAX / GLYCOLAX) 17 g packet Take 17 g by mouth daily.  . rosuvastatin (CRESTOR) 10 MG tablet Take 10 mg by mouth daily.  .  sitaGLIPtin-metformin (JANUMET) 50-1000 MG tablet Take 1 tablet by mouth 2 (two) times daily with a meal.      Allergies:   Oxycodone   Social History   Socioeconomic History  . Marital status: Married    Spouse name: Not on file  . Number of children: 3  . Years of education: Not on file  . Highest education level: Not on file  Occupational History  . Occupation: Retired-[employer]  . Smoking status: Former Smoker    Packs/day: 1.00    Years: 20.00    Pack years: 20.00    Types: Cigarettes    Quit date: 12/12/1992    Years since quitting: 27.5  . Smokeless tobacco: Never Used  Vaping Use  . Vaping Use: Never used  Substance and Sexual Activity  . Alcohol use: Yes    Alcohol/week: 1.0 standard drink    Types: 1 Cans of beer per week  . Drug use: No  . Sexual activity: Not Currently  Other Topics Concern  . Not on file  Social History Narrative  . Not on file   Social Determinants of Health   Financial Resource Strain:   . Difficulty of Paying Living Expenses:   Food Insecurity:   . Worried About Charity fundraiser in the Last Year:   . Arboriculturist in the Last Year:   Transportation Needs:   . Film/video editor (Medical):   [name] Lack of Transportation (Non-Medical):   Physical Activity:   . Days of Exercise per Week:   . Minutes of Exercise per Session:   Stress:   . Feeling of Stress :   Social Connections:   . Frequency of Communication with Friends and Family:   . Frequency of Social Gatherings with Friends and Family:   . Attends Religious Services:   . Active Member of Clubs or Organizations:   . Attends Archivist Meetings:   [name] Marital Status:      Family History:  The patient's family history includes CAD in his brother; Cancer in his mother; Heart attack (age of onset: 63) in his father.   ROS:   Please see the history of present illness.    ROS All other systems reviewed and are negative.   PHYSICAL  EXAM:   VS:  BP 110/66   Pulse 77   Ht 5' 9"  (1.753 m)   Wt 208 lb (94.3 kg)   SpO2 98%   BMI 30.72 kg/m   Physical Exam  GEN: Well nourished, well developed, in no acute distress  Neck: no JVD, carotid bruits, or masses Cardiac:RRR; no murmurs, rubs, or gallops  Respiratory:  clear to auscultation bilaterally, normal work of breathing GI: soft, nontender, nondistended, + BS Ext: without cyanosis, clubbing, or edema, Good distal pulses bilaterally Neuro:  Alert and Oriented x 3 Psych: euthymic mood, full affect  Wt Readings from Last 3 Encounters:  06/09/20 208 lb (94.3 kg)  04/29/20 219 lb (99.3 kg)  10/04/19 200 lb 3.2 oz (90.8 kg)      Studies/Labs Reviewed:   EKG:  EKG is not ordered today.    Recent Labs: 05/04/2020: ALT 40; BUN 15; Creatinine, Ser 1.11; Hemoglobin 11.2; Platelets 232; Potassium 5.1; Sodium 131   Lipid Panel    Component Value Date/Time   CHOL 105 05/07/2013 0854   TRIG 110.0 05/07/2013 0854   HDL 35.80 (L) 05/07/2013 0854   CHOLHDL 3 05/07/2013 0854   VLDL 22.0 05/07/2013 0854   LDLCALC 47 05/07/2013 0854    Additional studies/ records that were reviewed today include:  TEE 05/01/20 IMPRESSIONS     1. Left ventricular ejection fraction, by estimation, is 55 to 60%. The  left ventricle has normal function. The left ventricle has no regional  wall motion abnormalities.   2. Right ventricular systolic function is normal. The right ventricular  size is normal.   3. No left atrial/left atrial appendage thrombus was detected.   4. The mitral valve is normal in structure. Trivial mitral valve  regurgitation. No evidence of mitral stenosis.   5. No central regurgitation, trivial perivalvular regurgitation. There is  significant thickening of one biosprosthetic leaflet, suggestive of  endocarditis. No visualized perivalvular abscess. The aortic valve has  been repaired/replaced. Aortic valve  regurgitation is trivial. There is a 25 mm Edwards  Magna Ease Bovine  Pericardial Tissue valve present in the aortic position. Procedure Date:  10/26/2017. Echo findings are consistent with thrombus/mass of the aortic  prosthesis. Aortic valve mean gradient   measures 16.0  mmHg.   6. Pulmonic valve regurgitation not well visualized.   7. Aortic S/P bioprosthetic AVR. There is Severe (Grade IV) protruding  and atheroma plaque involving the descending aorta.   Conclusion(s)/Recommendation(s): Endocarditis of the bioprosthetic aortic  valve. No large independently mobile mass or significant central  regurgitation, but prominent thickening of a single bioprosthetic leaflet,  best visualized in image 80.   FINDINGS   Left Ventricle: Left ventricular ejection fraction, by estimation, is 55  to 60%. The left ventricle has normal function. The left ventricle has no  regional wall motion abnormalities. The left ventricular internal cavity  size was normal in size. There is   no left ventricular hypertrophy.   Right Ventricle: The right ventricular size is normal. No increase in  right ventricular wall thickness. Right ventricular systolic function is  normal.   Left Atrium: Left atrial size was not assessed. No left atrial/left atrial  appendage thrombus was detected.   Right Atrium: Right atrial size was not assessed. Prominent Chiari  network.   Pericardium: There is no evidence of pericardial effusion.   Mitral Valve: The mitral valve is normal in structure. Trivial mitral  valve regurgitation. No evidence of mitral valve stenosis. There is no  evidence of mitral valve vegetation.   Tricuspid Valve: The tricuspid valve is normal in structure. Tricuspid  valve regurgitation is trivial. There is no evidence of tricuspid valve  vegetation.   Aortic Valve: No central regurgitation, trivial perivalvular  regurgitation. There is significant thickening of one biosprosthetic  leaflet, suggestive of endocarditis. No visualized  perivalvular abscess.  The aortic valve has been repaired/replaced. Aortic   valve regurgitation is trivial. Aortic valve mean gradient measures 16.0  mmHg. Aortic valve peak gradient measures 32.0 mmHg. There is a 25 mm  Edwards Magna Ease Bovine Pericardial Tissue valve present in the aortic  position. Procedure Date: 10/26/2017.   Pulmonic Valve: Shadow from aortic valve makes evaluation of PV difficult.  The pulmonic valve was not well visualized. Pulmonic valve regurgitation  not well visualized.   Aorta: S/P bioprosthetic AVR. There is severe (Grade IV) protruding and  atheroma plaque involving the descending aorta.   IAS/Shunts: No atrial level shunt detected by color flow Doppler.     AORTIC VALVE  AV Vmax:           283.00 cm/s  AV Vmean:          191.000 cm/s  AV VTI:            0.591 m  AV Peak Grad:      32.0 mmHg  AV Mean Grad:      16.0 mmHg  LVOT Vmax:         177.00 cm/s  LVOT Vmean:        123.000 cm/s  LVOT VTI:          0.353 m  LVOT/AV VTI ratio: 0.60      ASSESSMENT:    1. Acute bacterial endocarditis   2. S/P AVR (aortic valve replacement)   3. S/P CABG x 2   4. History of CVA (cerebrovascular accident)   5. Essential hypertension   6. Hyperlipidemia, unspecified hyperlipidemia type   7. Obesities, morbid ([place])   8. OSA on CPAP      PLAN:  In order of problems listed above:  Bacterial endocarditis on IV Rocephin and gentamicin.  TEE 05/01/2020 bioprosthetic leaflet had significant thickening consistent with endocarditis.Has 3 more days of IV Rocephin-managed by ID-discussed with  Dr. Meda Coffee DOD who recommends f/u 2D echo 1 month after he finishes antibiotics. He should call for any new symptoms such as fever, shortness of breath  Status post bioprosthetic AVR and CABG times 01/2017-no angina  History of CVA status post right carotid endarterectomy with subsequent right internal carotid artery occlusion  Essential hypertension-BP  controlled  Hyperlipidemia managed by PCP  Obesity exercise and weight loss.  OSA on CPAP  Medication Adjustments/Labs and Tests Ordered: Current medicines are reviewed at length with the patient today.  Concerns regarding medicines are outlined above.  Medication changes, Labs and Tests ordered today are listed in the Patient Instructions below. There are no Patient Instructions on file for this visit.   Sumner Boast, PA-C  06/09/2020 9:03 AM    [facility] [address], [place], [place]  [address] Phone: [phone]; Fax: [phone]

## 2020-06-04 ENCOUNTER — Other Ambulatory Visit: Payer: Self-pay

## 2020-06-04 DIAGNOSIS — K5730 Diverticulosis of large intestine without perforation or abscess without bleeding: Secondary | ICD-10-CM | POA: Diagnosis not present

## 2020-06-04 DIAGNOSIS — R7881 Bacteremia: Secondary | ICD-10-CM | POA: Diagnosis not present

## 2020-06-04 DIAGNOSIS — E669 Obesity, unspecified: Secondary | ICD-10-CM | POA: Diagnosis not present

## 2020-06-04 DIAGNOSIS — I2510 Atherosclerotic heart disease of native coronary artery without angina pectoris: Secondary | ICD-10-CM | POA: Diagnosis not present

## 2020-06-04 DIAGNOSIS — K2270 Barrett's esophagus without dysplasia: Secondary | ICD-10-CM | POA: Diagnosis not present

## 2020-06-04 DIAGNOSIS — Z6831 Body mass index (BMI) 31.0-31.9, adult: Secondary | ICD-10-CM | POA: Diagnosis not present

## 2020-06-04 DIAGNOSIS — G4733 Obstructive sleep apnea (adult) (pediatric): Secondary | ICD-10-CM | POA: Diagnosis not present

## 2020-06-04 DIAGNOSIS — K219 Gastro-esophageal reflux disease without esophagitis: Secondary | ICD-10-CM | POA: Diagnosis not present

## 2020-06-04 DIAGNOSIS — Z87891 Personal history of nicotine dependence: Secondary | ICD-10-CM | POA: Diagnosis not present

## 2020-06-04 DIAGNOSIS — A491 Streptococcal infection, unspecified site: Secondary | ICD-10-CM | POA: Diagnosis not present

## 2020-06-04 DIAGNOSIS — Z7982 Long term (current) use of aspirin: Secondary | ICD-10-CM | POA: Diagnosis not present

## 2020-06-04 DIAGNOSIS — T826XXD Infection and inflammatory reaction due to cardiac valve prosthesis, subsequent encounter: Secondary | ICD-10-CM | POA: Diagnosis not present

## 2020-06-04 DIAGNOSIS — Z452 Encounter for adjustment and management of vascular access device: Secondary | ICD-10-CM | POA: Diagnosis not present

## 2020-06-04 DIAGNOSIS — E785 Hyperlipidemia, unspecified: Secondary | ICD-10-CM | POA: Diagnosis not present

## 2020-06-04 DIAGNOSIS — B9689 Other specified bacterial agents as the cause of diseases classified elsewhere: Secondary | ICD-10-CM | POA: Diagnosis not present

## 2020-06-04 DIAGNOSIS — Z8619 Personal history of other infectious and parasitic diseases: Secondary | ICD-10-CM | POA: Diagnosis not present

## 2020-06-04 DIAGNOSIS — I10 Essential (primary) hypertension: Secondary | ICD-10-CM | POA: Diagnosis not present

## 2020-06-04 DIAGNOSIS — Z7984 Long term (current) use of oral hypoglycemic drugs: Secondary | ICD-10-CM | POA: Diagnosis not present

## 2020-06-04 DIAGNOSIS — Z792 Long term (current) use of antibiotics: Secondary | ICD-10-CM | POA: Diagnosis not present

## 2020-06-04 DIAGNOSIS — Z1211 Encounter for screening for malignant neoplasm of colon: Secondary | ICD-10-CM | POA: Diagnosis not present

## 2020-06-04 DIAGNOSIS — B954 Other streptococcus as the cause of diseases classified elsewhere: Secondary | ICD-10-CM | POA: Diagnosis not present

## 2020-06-04 DIAGNOSIS — I330 Acute and subacute infective endocarditis: Secondary | ICD-10-CM | POA: Diagnosis not present

## 2020-06-04 DIAGNOSIS — E119 Type 2 diabetes mellitus without complications: Secondary | ICD-10-CM | POA: Diagnosis not present

## 2020-06-04 MED ORDER — LISINOPRIL 5 MG PO TABS: 5.0000 mg | ORAL_TABLET | Freq: Every day | ORAL | 0 refills | Status: DC

## 2020-06-08 DIAGNOSIS — R7881 Bacteremia: Secondary | ICD-10-CM | POA: Diagnosis not present

## 2020-06-08 DIAGNOSIS — I330 Acute and subacute infective endocarditis: Secondary | ICD-10-CM | POA: Diagnosis not present

## 2020-06-08 DIAGNOSIS — T826XXD Infection and inflammatory reaction due to cardiac valve prosthesis, subsequent encounter: Secondary | ICD-10-CM | POA: Diagnosis not present

## 2020-06-09 ENCOUNTER — Encounter: Payer: Self-pay | Admitting: Physician Assistant

## 2020-06-09 ENCOUNTER — Telehealth: Payer: Self-pay

## 2020-06-09 ENCOUNTER — Other Ambulatory Visit: Payer: Self-pay

## 2020-06-09 ENCOUNTER — Telehealth (INDEPENDENT_AMBULATORY_CARE_PROVIDER_SITE_OTHER): Payer: PPO | Admitting: Internal Medicine

## 2020-06-09 ENCOUNTER — Ambulatory Visit: Payer: PPO | Admitting: Physician Assistant

## 2020-06-09 VITALS — BP 110/66 | HR 77 | Ht 69.0 in | Wt 208.0 lb

## 2020-06-09 DIAGNOSIS — I10 Essential (primary) hypertension: Secondary | ICD-10-CM

## 2020-06-09 DIAGNOSIS — Z951 Presence of aortocoronary bypass graft: Secondary | ICD-10-CM

## 2020-06-09 DIAGNOSIS — N179 Acute kidney failure, unspecified: Secondary | ICD-10-CM | POA: Diagnosis not present

## 2020-06-09 DIAGNOSIS — Z9989 Dependence on other enabling machines and devices: Secondary | ICD-10-CM

## 2020-06-09 DIAGNOSIS — G4733 Obstructive sleep apnea (adult) (pediatric): Secondary | ICD-10-CM | POA: Diagnosis not present

## 2020-06-09 DIAGNOSIS — I330 Acute and subacute infective endocarditis: Secondary | ICD-10-CM

## 2020-06-09 DIAGNOSIS — E785 Hyperlipidemia, unspecified: Secondary | ICD-10-CM

## 2020-06-09 DIAGNOSIS — T826XXD Infection and inflammatory reaction due to cardiac valve prosthesis, subsequent encounter: Secondary | ICD-10-CM

## 2020-06-09 DIAGNOSIS — Z952 Presence of prosthetic heart valve: Secondary | ICD-10-CM | POA: Diagnosis not present

## 2020-06-09 DIAGNOSIS — R040 Epistaxis: Secondary | ICD-10-CM

## 2020-06-09 DIAGNOSIS — Z8673 Personal history of transient ischemic attack (TIA), and cerebral infarction without residual deficits: Secondary | ICD-10-CM

## 2020-06-09 DIAGNOSIS — I38 Endocarditis, valve unspecified: Secondary | ICD-10-CM | POA: Diagnosis not present

## 2020-06-09 DIAGNOSIS — R7881 Bacteremia: Secondary | ICD-10-CM | POA: Diagnosis not present

## 2020-06-09 NOTE — Progress Notes (Signed)
Virtual Visit via Telephone Note  I connected with Theodore Jenkins on 06/09/20 at  2:45 PM EDT by telephone and verified that I am speaking with the correct person using two identifiers.  Location: Patient: at home Provider: in clinic   I discussed the limitations, risks, security and privacy concerns of performing an evaluation and management service by telephone and the availability of in person appointments. I also discussed with the patient that there may be a patient responsible charge related to this service. The patient expressed understanding and agreed to proceed.   History of Present Illness: 74yo M with hx of streptococcal infantarium PVE, initially admitted on 5/14 to hospital. Blood cx cleared on 5/21. Initially started on 6 wk of ceftriaxone plus 2 wk of gentamicin. At appt on 6/8, gentamicin discontinued due to [facility]. He has been on ceftriaxone. To take through July 1st.  +reports fatigue, has some shortness of breath of exertion,  Had colonoscopy last week which was clean  ROS: +epistaxis = which he thinks is due to the addn heparin flushes with abtx No fever, chills, nightsweats.   Observations/Objective:  Reviewed bloodwork with patient  Assessment and Plan:  Streptococcal PVE = finishes abtx on July 1st, will pull line on July 2nd. Saw his cardiologist who will repeat echo in 4 wks time. Will have him  repeat blood cx in 10-14 days.  aki =Kidney function resolved- back to baseline based on recent labs  Epistaxis = refer to primary care doctor if this still persists at the end of the month   Follow Up Instructions:    I discussed the assessment and treatment plan with the patient (as mentioned above). The patient was provided an opportunity to ask questions and all were answered. The patient agreed with the plan and demonstrated an understanding of the instructions.   The patient was advised to call back or seek an in-person evaluation if the symptoms worsen or if  the condition fails to improve as anticipated.  I provided 15 minutes of non-face-to-face time during this encounter.   Carlyle Basques, MD

## 2020-06-09 NOTE — Patient Instructions (Signed)
Medication Instructions:  Your physician recommends that you continue on your current medications as directed. Please refer to the Current Medication list given to you today.  *If you need a refill on your cardiac medications before your next appointment, please call your pharmacy*   Lab Work: None  If you have labs (blood work) drawn today and your tests are completely normal, you will receive your results only by: [name] MyChart Message (if you have MyChart) OR . A paper copy in the mail If you have any lab test that is abnormal or we need to change your treatment, we will call you to review the results.   Testing/Procedures: Your physician has requested that you have an echocardiogram on 07/13/20 at 8:35 AM. Please arrive at 8:20 AM. Echocardiography is a painless test that uses sound waves to create images of your heart. It provides your doctor with information about the size and shape of your heart and how well your heart's chambers and valves are working. This procedure takes approximately one hour. There are no restrictions for this procedure.   Follow-Up: Follow up with Dr. Angelena Form on 09/28/20 at 9:00 AM   Other Instructions None

## 2020-06-09 NOTE — Telephone Encounter (Signed)
Verbal order given to Debbie with [facility] to pull patient's PICC line on 06/12/20 per Dr. Baxter Flattery. Debbie read order back and verbalized understanding

## 2020-06-16 DIAGNOSIS — G4733 Obstructive sleep apnea (adult) (pediatric): Secondary | ICD-10-CM | POA: Diagnosis not present

## 2020-06-22 ENCOUNTER — Other Ambulatory Visit: Payer: Self-pay

## 2020-06-22 ENCOUNTER — Other Ambulatory Visit: Payer: PPO

## 2020-06-22 DIAGNOSIS — T826XXD Infection and inflammatory reaction due to cardiac valve prosthesis, subsequent encounter: Secondary | ICD-10-CM

## 2020-06-22 DIAGNOSIS — I38 Endocarditis, valve unspecified: Secondary | ICD-10-CM

## 2020-06-28 LAB — CULTURE, BLOOD (SINGLE)
MICRO NUMBER:: 10693944
MICRO NUMBER:: 10693945

## 2020-07-13 ENCOUNTER — Other Ambulatory Visit: Payer: Self-pay

## 2020-07-13 ENCOUNTER — Ambulatory Visit (HOSPITAL_COMMUNITY): Payer: PPO | Attending: Cardiology

## 2020-07-13 DIAGNOSIS — Z952 Presence of prosthetic heart valve: Secondary | ICD-10-CM | POA: Diagnosis not present

## 2020-07-13 DIAGNOSIS — I330 Acute and subacute infective endocarditis: Secondary | ICD-10-CM | POA: Insufficient documentation

## 2020-07-13 LAB — ECHOCARDIOGRAM COMPLETE
AR max vel: 1.82 cm2
AV Area VTI: 1.82 cm2
AV Area mean vel: 1.36 cm2
AV Mean grad: 6 mmHg
AV Peak grad: 10.1 mmHg
Ao pk vel: 1.59 m/s
Area-P 1/2: 3.56 cm2
S' Lateral: 2.2 cm

## 2020-07-31 DIAGNOSIS — E7800 Pure hypercholesterolemia, unspecified: Secondary | ICD-10-CM | POA: Diagnosis not present

## 2020-07-31 DIAGNOSIS — E118 Type 2 diabetes mellitus with unspecified complications: Secondary | ICD-10-CM | POA: Diagnosis not present

## 2020-08-03 DIAGNOSIS — E118 Type 2 diabetes mellitus with unspecified complications: Secondary | ICD-10-CM | POA: Diagnosis not present

## 2020-08-03 DIAGNOSIS — I714 Abdominal aortic aneurysm, without rupture: Secondary | ICD-10-CM | POA: Diagnosis not present

## 2020-08-03 DIAGNOSIS — K219 Gastro-esophageal reflux disease without esophagitis: Secondary | ICD-10-CM | POA: Diagnosis not present

## 2020-08-03 DIAGNOSIS — I10 Essential (primary) hypertension: Secondary | ICD-10-CM | POA: Diagnosis not present

## 2020-08-03 DIAGNOSIS — E7800 Pure hypercholesterolemia, unspecified: Secondary | ICD-10-CM | POA: Diagnosis not present

## 2020-08-03 DIAGNOSIS — I2510 Atherosclerotic heart disease of native coronary artery without angina pectoris: Secondary | ICD-10-CM | POA: Diagnosis not present

## 2020-08-26 DIAGNOSIS — H04123 Dry eye syndrome of bilateral lacrimal glands: Secondary | ICD-10-CM | POA: Diagnosis not present

## 2020-08-26 DIAGNOSIS — E113293 Type 2 diabetes mellitus with mild nonproliferative diabetic retinopathy without macular edema, bilateral: Secondary | ICD-10-CM | POA: Diagnosis not present

## 2020-08-26 DIAGNOSIS — Z961 Presence of intraocular lens: Secondary | ICD-10-CM | POA: Diagnosis not present

## 2020-08-26 DIAGNOSIS — E1165 Type 2 diabetes mellitus with hyperglycemia: Secondary | ICD-10-CM | POA: Diagnosis not present

## 2020-09-02 ENCOUNTER — Other Ambulatory Visit: Payer: Self-pay | Admitting: Cardiovascular Disease

## 2020-09-16 DIAGNOSIS — G4733 Obstructive sleep apnea (adult) (pediatric): Secondary | ICD-10-CM | POA: Diagnosis not present

## 2020-09-25 NOTE — Progress Notes (Signed)
Chief Complaint  Patient presents with  . Follow-up    CAD    History of Present Illness:  74 y.o. male with history of CAD s/p PCI of the LAD in 2005, 2V CABG (LIMA to LAD and SVG to OM) in 2018, severe aortic stenosis s/p bioprosthetic AVR in 2018, CVA s/p right carotid endarterectomy with subsequent right internal carotid artery occlusion, Barrett's esophagitis, AAA, HTN, HLD and DM who is here today for cardiac follow up. He underwent 2V CABG and AVR in 2018. Echo June 2019 with LVEF=55-60% with normally functioning bioprosthetic AVR. He presented to the ED May 2021 with fevers and malaise and blood cultures grew gram positive cocci. TEE 05/01/20 with significant thickening of one bioprosthetic leaflet suggestive of endocarditis. He was treated with IV Rocephin and gentamicin through 06/11/20. He was seen in our office 06/09/20 by Ermalinda Barrios, PA and was doing well. Echo 07/13/20 with LVEF=55%. Thickening of bioprosthetic aortic valve leaflets. Mean gradient 6 mmHg with no aortic valve regurgitation.   He is here today for follow up. The patient denies any chest pain, dyspnea, palpitations, lower extremity edema, orthopnea, PND, dizziness, near syncope or syncope. Feeling well overall.   Primary Care Physician: Karlene Einstein, MD   Past Medical History:  Diagnosis Date  . Aortic stenosis    Echo (08/2017): Severe AS with mean gradient of 42 mmHg.  . Barrett's esophagus   . CAD (coronary artery disease) 06/06/2011   Remote stent to the LAD in 2005 // Last cath in 2010 showing diffuse CAD, small PD that is occluded and fills with left to right collaterals, 60-70% 1st OM, with normal EF. Managed medically // Myoview 3/18:  Normal perfusion. LVEF 55% with normal wall motion. This is a low risk study.  . Complication of anesthesia   . Diabetes mellitus   . ED (erectile dysfunction)   . Family history of adverse reaction to anesthesia 10/23/2017   sister had history of malignant hyperthermia  when she was 6, she is 72 years old now  . GERD (gastroesophageal reflux disease)   . Hyperlipidemia   . Hypertension   . IHD (ischemic heart disease)    prior stenting of the LAD in December of 2005; with last cath in 2010 showing diffuse CAD with normal LV function. He is managed medically.   . Malignant hyperthermia    sister had reaction concerning for malignant hyperthermia (~ 1988)  . Neuromuscular disorder ([place]) 10/23/2017   lumbar 4 disc in back causing nerve pain, on gabapentin  . Obesity   . Obstructive sleep apnea on CPAP   . PONV (postoperative nausea and vomiting)   . S/P aortic valve replacement with bioprosthetic valve 10/26/2017   25 mm [facility] Ease bovine pericardial tissue valve  . S/P coronary artery stent placement 2005   LAD    Past Surgical History:  Procedure Laterality Date  . AORTIC VALVE REPLACEMENT N/A 10/26/2017   Procedure: AORTIC VALVE REPLACEMENT (AVR), using Magna Ease 25;  Surgeon: Rexene Alberts, MD;  Location: [place];  Service: Open Heart Surgery;  Laterality: N/A;  . CARDIAC CATHETERIZATION  2010   NORMAL LV FUNCTION. DIFFUSE CORONARY DISEASE WITH THE DISTAL PORTION OF THE SMALL POSTERIOR DESCENDING VESSEL OCCLUDED AND FILLING WITH THE RIGHT COLLATERALS, 60-70% STENOSIS IN THE FIRST OBTUSE MARGINAL VESSEL AND DIFFUSE SMALL VESSEL DISEASE  . CAROTID ENDARTERECTOMY    . CIRCUMCISION  1985  . CORONARY ARTERY BYPASS GRAFT N/A 10/26/2017   Procedure:  CORONARY ARTERY BYPASS GRAFTING (CABG) x two , using left internal mammary artery and right leg greater saphenous vein harvested endoscopically;  Surgeon: Rexene Alberts, MD;  Location: [place];  Service: Open Heart Surgery;  Laterality: N/A;  . CORONARY STENT PLACEMENT  2005  . RIGHT/LEFT HEART CATH AND CORONARY ANGIOGRAPHY N/A 08/25/2017   Procedure: RIGHT/LEFT HEART CATH AND CORONARY ANGIOGRAPHY;  Surgeon: Nelva Bush, MD;  Location: [place] [facility];  Service: Cardiovascular;  Laterality:  N/A;  . rotator cuff surgery Right   . TEE WITHOUT CARDIOVERSION N/A 10/26/2017   Procedure: TRANSESOPHAGEAL ECHOCARDIOGRAM (TEE);  Surgeon: Rexene Alberts, MD;  Location: [place];  Service: Open Heart Surgery;  Laterality: N/A;  . TEE WITHOUT CARDIOVERSION N/A 05/01/2020   Procedure: TRANSESOPHAGEAL ECHOCARDIOGRAM (TEE);  Surgeon: Buford Dresser, MD;  Location[facility] ENDOSCOPY;  Service: Cardiovascular;  Laterality: N/A;    Current Outpatient Medications  Medication Sig Dispense Refill  . acetaminophen (TYLENOL) 500 MG tablet Take 1,000 mg by mouth every 8 (eight) hours as needed for headache.    . Ascorbic Acid (VITAMIN C) 500 MG CAPS Take 1 capsule by mouth daily.    [name] aspirin 81 MG EC tablet Take 1 tablet (81 mg total) by mouth daily.    . fluticasone (FLONASE) 50 MCG/ACT nasal spray Place 2 sprays into both nostrils daily as needed for allergies or rhinitis.     [name] gabapentin (NEURONTIN) 300 MG capsule Take 600 mg by mouth at bedtime.     [name] lisinopril (ZESTRIL) 5 MG tablet TAKE 1 TABLET BY MOUTH DAILY 90 tablet 2  . loratadine (CLARITIN) 10 MG tablet Take 10 mg by mouth daily.    . metoprolol succinate (TOPROL-XL) 50 MG 24 hr tablet Take 50 mg by mouth daily. Take with or immediately following a meal.    . Multiple Vitamins-Minerals (ONE-A-DAY 50 PLUS PO) Take 1 tablet by mouth daily.    [name] omeprazole (PRILOSEC OTC) 20 MG tablet Take 20 mg by mouth daily.      Glory Rosebush VERIO test strip TEST BS ONCE D  3  . polyethylene glycol (MIRALAX / GLYCOLAX) 17 g packet Take 17 g by mouth daily. 14 each 0  . rosuvastatin (CRESTOR) 10 MG tablet Take 10 mg by mouth daily.    . sitaGLIPtin-metformin (JANUMET) 50-1000 MG tablet Take 1 tablet by mouth 2 (two) times daily with a meal.     . nitroGLYCERIN (NITROSTAT) 0.4 MG SL tablet Place 1 tablet (0.4 mg total) under the tongue every 5 (five) minutes as needed for chest pain. 25 tablet 6   No current facility-administered medications for this  visit.    Allergies  Allergen Reactions  . Oxycodone Nausea And Vomiting    Social History   Socioeconomic History  . Marital status: Married    Spouse name: Not on file  . Number of children: 3  . Years of education: Not on file  . Highest education level: Not on file  Occupational History  . Occupation: Retired-[employer]  . Smoking status: Former Smoker    Packs/day: 1.00    Years: 20.00    Pack years: 20.00    Types: Cigarettes    Quit date: 12/12/1992    Years since quitting: 27.8  . Smokeless tobacco: Never Used  Vaping Use  . Vaping Use: Never used  Substance and Sexual Activity  . Alcohol use: Yes    Alcohol/week: 1.0 standard drink  Types: 1 Cans of beer per week  . Drug use: No  . Sexual activity: Not Currently  Other Topics Concern  . Not on file  Social History Narrative  . Not on file   Social Determinants of Health   Financial Resource Strain:   . Difficulty of Paying Living Expenses: Not on file  Food Insecurity:   . Worried About Charity fundraiser in the Last Year: Not on file  . Ran Out of Food in the Last Year: Not on file  Transportation Needs:   . Lack of Transportation (Medical): Not on file  . Lack of Transportation (Non-Medical): Not on file  Physical Activity:   . Days of Exercise per Week: Not on file  . Minutes of Exercise per Session: Not on file  Stress:   . Feeling of Stress : Not on file  Social Connections:   . Frequency of Communication with Friends and Family: Not on file  . Frequency of Social Gatherings with Friends and Family: Not on file  . Attends Religious Services: Not on file  . Active Member of Clubs or Organizations: Not on file  . Attends Archivist Meetings: Not on file  . Marital Status: Not on file  Intimate Partner Violence:   . Fear of Current or Ex-Partner: Not on file  . Emotionally Abused: Not on file  . Physically Abused: Not on file  . Sexually Abused: Not on  file    Family History  Problem Relation Age of Onset  . Cancer Mother   . Heart attack Father 30  . CAD Brother     Review of Systems:  As stated in the HPI and otherwise negative.   BP 114/60   Pulse 62   Ht 5\' 9"  (1.753 m)   Wt 222 lb 6.4 oz (100.9 kg)   SpO2 97%   BMI 32.84 kg/m   Physical Examination: General: Well developed, well nourished, NAD  HEENT: OP clear, mucus membranes moist  SKIN: warm, dry. No rashes. Neuro: No focal deficits  Musculoskeletal: Muscle strength 5/5 all ext  Psychiatric: Mood and affect normal  Neck: No JVD, no carotid bruits, no thyromegaly, no lymphadenopathy.  Lungs:Clear bilaterally, no wheezes, rhonci, crackles Cardiovascular: Regular rate and rhythm. No murmurs, gallops or rubs. Abdomen:Soft. Bowel sounds present. Non-tender.  Extremities: No lower extremity edema. Pulses are 2 + in the bilateral DP/PT.  Echo 07/13/20:   1. Left ventricular ejection fraction, by estimation, is 55%. The left  ventricle has normal function. The left ventricle has no regional wall  motion abnormalities. There is moderate left ventricular hypertrophy. Left  ventricular diastolic parameters are  consistent with Grade I diastolic dysfunction (impaired relaxation).  Septal bounce consistent with prior cardiac surgery.  2. Right ventricular systolic function is normal. The right ventricular  size is normal. Tricuspid regurgitation signal is inadequate for assessing  PA pressure.  3. The mitral valve is normal in structure. Mild mitral valve  regurgitation. No evidence of mitral stenosis.  4. Bioprosthetic aortic valve. The aortic valve is thickened, especially  noted in the parasternal long axis view. Note history of recently  diagnosed endocarditis by TEE, needs clinical correlation. Aortic valve  regurgitation is not visualized. No aortic  stenosis is present. Aortic valve mean gradient measures 6.0 mmHg.  5. The inferior vena cava is normal in  size with greater than 50%  respiratory variability, suggesting right atrial pressure of 3 mmHg.  6. Chiari network noted in right atrium.  Cardiac cath 08/25/17: Findings: 1. Multivessel coronary artery disease including diffuse mild to moderate proximal to mid LAD disease, 50-60% in-stent restenosis of the distal LAD stent, moderate-caliber OM2 branch with 80% proximal disease, and dominant RCA with chronic total occlusion of very small posterior descending artery. Disease predominantly affects small/distal vessels. 2. Severe aortic stenosis with mean transvalvular gradient by pullback of 66 mmHg and peak-to-peak gradient of 71 mmHg. 3. Normal left and right heart filling pressures. 4. Normal Fick cardiac output.  EKG:  EKG is ordered today. The ekg ordered today demonstrates sinus.    Recent Labs: 05/04/2020: ALT 40; BUN 15; Creatinine, Ser 1.11; Hemoglobin 11.2; Platelets 232; Potassium 5.1; Sodium 131   Lipid Panel    Component Value Date/Time   CHOL 105 05/07/2013 0854   TRIG 110.0 05/07/2013 0854   HDL 35.80 (L) 05/07/2013 0854   CHOLHDL 3 05/07/2013 0854   VLDL 22.0 05/07/2013 0854   LDLCALC 47 05/07/2013 0854     Wt Readings from Last 3 Encounters:  09/28/20 222 lb 6.4 oz (100.9 kg)  06/09/20 208 lb (94.3 kg)  04/29/20 219 lb (99.3 kg)     Assessment and Plan:   1. Aortic stenosis s/p bioprosthetic AVR: Echo in August 2021 with thickening of the aortic valve leaflets. No significant gradient to suggest stenosis. No aortic regurgitation. He had bacteremia of unknown source in May 2021 but the valve is working well. No further workup at this time. Continue ASA. He will use SBE prophylaxis as necessary.   2. CAD s/p CABG without angina: No chest pain. Continue ASA and statin.     3. Hyperlipidemia: Lipids followed in primary care and well controlled with LDL of 63  Continue statin.     4. Carotid artery disease: Followed in vascular surgery office  Labs/ tests  ordered today include:   Orders Placed This Encounter  Procedures  . EKG 12-Lead   Disposition:   Follow up with me in one year.   Signed, Lauree Chandler, MD 09/28/2020 9:44 AM    [facility] [place], [place], [place]  [address] Phone: [phone]; Fax: [phone]

## 2020-09-28 ENCOUNTER — Ambulatory Visit: Payer: PPO | Admitting: Cardiovascular Disease

## 2020-09-28 ENCOUNTER — Encounter: Payer: Self-pay | Admitting: Cardiovascular Disease

## 2020-09-28 ENCOUNTER — Other Ambulatory Visit: Payer: Self-pay

## 2020-09-28 VITALS — BP 114/60 | HR 62 | Ht 69.0 in | Wt 222.4 lb

## 2020-09-28 DIAGNOSIS — E785 Hyperlipidemia, unspecified: Secondary | ICD-10-CM | POA: Diagnosis not present

## 2020-09-28 DIAGNOSIS — I2510 Atherosclerotic heart disease of native coronary artery without angina pectoris: Secondary | ICD-10-CM

## 2020-09-28 DIAGNOSIS — I350 Nonrheumatic aortic (valve) stenosis: Secondary | ICD-10-CM

## 2020-09-28 DIAGNOSIS — I6523 Occlusion and stenosis of bilateral carotid arteries: Secondary | ICD-10-CM

## 2020-09-28 NOTE — Patient Instructions (Signed)

## 2020-12-07 ENCOUNTER — Telehealth: Payer: Self-pay | Admitting: Cardiovascular Disease

## 2020-12-07 NOTE — Telephone Encounter (Signed)
I could see him Wednesday at 4pm if that works for them. If he has more progressive dyspnea before then, he should go to the ED. Theodore Jenkins

## 2020-12-07 NOTE — Telephone Encounter (Signed)
Spoke with pt's wife(on DPR) who reports pt has been feeling poorly with increasing SOB x 2-3 weeks like he had before his valve replacement.  She denies cough/fever but has been c/o having no energy, leg weakness and some very slight edema.  He has been wearing his compression stockings.  Wife reports pt actually stated he should probably go to the ED but doesn't want to.  Wife would like him to be seen in office, if possible.  She doesn't feels as though his s/s are bad enough to go to the ED but understands if they become worse he will not have a choice.  She is mainly concerned with the progressive SOB and weakness.  Advised I will sent this information to Dr Clifton James and his nurse for review.

## 2020-12-07 NOTE — Telephone Encounter (Signed)
Wife aware of appt date and time.  Aware to report to the ED if s/s worsen

## 2020-12-07 NOTE — Telephone Encounter (Signed)
Pt c/o Shortness Of Breath: STAT if SOB developed within the last 24 hours or pt is noticeably SOB on the phone  1. Are you currently SOB (can you hear that pt is SOB on the phone)? no  2. How long have you been experiencing SOB? 2-3 weeks  3. Are you SOB when sitting or when up moving around? All the time, gets worse when he gets up and starts moving around  4. Are you currently experiencing any other symptoms? Fatigue, weakness in his legs   Wife of patient called. Wife wanted to know if any recommendations could be made without going to the ER. Please advise

## 2020-12-09 ENCOUNTER — Ambulatory Visit: Payer: PPO | Admitting: Cardiovascular Disease

## 2020-12-09 ENCOUNTER — Other Ambulatory Visit: Payer: Self-pay

## 2020-12-09 ENCOUNTER — Encounter: Payer: Self-pay | Admitting: Cardiovascular Disease

## 2020-12-09 VITALS — BP 124/70 | HR 72 | Ht 69.0 in | Wt 225.0 lb

## 2020-12-09 DIAGNOSIS — R0600 Dyspnea, unspecified: Secondary | ICD-10-CM

## 2020-12-09 DIAGNOSIS — I350 Nonrheumatic aortic (valve) stenosis: Secondary | ICD-10-CM | POA: Diagnosis not present

## 2020-12-09 DIAGNOSIS — I2510 Atherosclerotic heart disease of native coronary artery without angina pectoris: Secondary | ICD-10-CM | POA: Diagnosis not present

## 2020-12-09 DIAGNOSIS — E785 Hyperlipidemia, unspecified: Secondary | ICD-10-CM

## 2020-12-09 NOTE — Progress Notes (Signed)
Chief Complaint  Patient presents with  . Follow-up    CAD   History of Present Illness:  74 yo male with history of CAD s/p PCI of the LAD in 2005, CABG (LIMA to LAD and SVG to OM) in 2018, severe aortic stenosis s/p bioprosthetic AVR in 2018, CVA s/p right carotid endarterectomy with subsequent right internal carotid artery occlusion, Barrett's esophagitis, AAA, HTN, HLD and DM who is here today for cardiac follow up. He underwent 2V CABG and AVR in 2018. Echo June 2019 with LVEF=55-60% with normally functioning bioprosthetic AVR. He was admitted to [facility] May 2021 with fevers and malaise and his blood cultures grew gram positive cocci. TEE 05/01/20 with significant thickening of one of the bioprosthetic leaflets suggestive of endocarditis. He was treated with IV Rocephin and Gentamycin. Echo 07/13/20 with LVEF=55%, moderate LVH, mild MR. There was thickening of one of the leaflets of the bioprosthetic aortic valve but no aortic insufficiency or aortic stenosis. He was seen in follow up in our office 09/28/20 and was doing well.   He is here today for follow up. The patient denies any chest pain, palpitations, lower extremity edema, orthopnea, PND, dizziness, near syncope or syncope. He reports dyspnea with minimal exertion for several weeks. He has had no recent weight gain and no swelling in his ankles.   Primary Care Physician: Jolene Provost, MD   Past Medical History:  Diagnosis Date  . Aortic stenosis    Echo (08/2017): Severe AS with mean gradient of 42 mmHg.  . Barrett's esophagus   . CAD (coronary artery disease) 06/06/2011   Remote stent to the LAD in 2005 // Last cath in 2010 showing diffuse CAD, small PD that is occluded and fills with left to right collaterals, 60-70% 1st OM, with normal EF. Managed medically // Myoview 3/18:  Normal perfusion. LVEF 55% with normal wall motion. This is a low risk study.  . Complication of anesthesia   . Diabetes mellitus   . ED (erectile  dysfunction)   . Family history of adverse reaction to anesthesia 10/23/2017   sister had history of malignant hyperthermia when she was 9, she is 72 years old now  . GERD (gastroesophageal reflux disease)   . Hyperlipidemia   . Hypertension   . IHD (ischemic heart disease)    prior stenting of the LAD in December of 2005; with last cath in 2010 showing diffuse CAD with normal LV function. He is managed medically.   . Malignant hyperthermia    sister had reaction concerning for malignant hyperthermia (~ 1988)  . Neuromuscular disorder (HCC) 10/23/2017   lumbar 4 disc in back causing nerve pain, on gabapentin  . Obesity   . Obstructive sleep apnea on CPAP   . PONV (postoperative nausea and vomiting)   . S/P aortic valve replacement with bioprosthetic valve 10/26/2017   25 mm [facility] Ease bovine pericardial tissue valve  . S/P coronary artery stent placement 2005   LAD    Past Surgical History:  Procedure Laterality Date  . AORTIC VALVE REPLACEMENT N/A 10/26/2017   Procedure: AORTIC VALVE REPLACEMENT (AVR), using Magna Ease 25;  Surgeon: Purcell Nails, MD;  Location: [facility] OR;  Service: Open Heart Surgery;  Laterality: N/A;  . CARDIAC CATHETERIZATION  2010   NORMAL LV FUNCTION. DIFFUSE CORONARY DISEASE WITH THE DISTAL PORTION OF THE SMALL POSTERIOR DESCENDING VESSEL OCCLUDED AND FILLING WITH THE RIGHT COLLATERALS, 60-70% STENOSIS IN THE FIRST OBTUSE MARGINAL VESSEL AND DIFFUSE  SMALL VESSEL DISEASE  . CAROTID ENDARTERECTOMY    . CIRCUMCISION  1985  . CORONARY ARTERY BYPASS GRAFT N/A 10/26/2017   Procedure: CORONARY ARTERY BYPASS GRAFTING (CABG) x two , using left internal mammary artery and right leg greater saphenous vein harvested endoscopically;  Surgeon: Purcell Nails, MD;  Location: [facility] OR;  Service: Open Heart Surgery;  Laterality: N/A;  . CORONARY STENT PLACEMENT  2005  . RIGHT/LEFT HEART CATH AND CORONARY ANGIOGRAPHY N/A 08/25/2017   Procedure: RIGHT/LEFT HEART CATH AND  CORONARY ANGIOGRAPHY;  Surgeon: Yvonne Kendall, MD;  Location: [facility] INVASIVE CV LAB;  Service: Cardiovascular;  Laterality: N/A;  . rotator cuff surgery Right   . TEE WITHOUT CARDIOVERSION N/A 10/26/2017   Procedure: TRANSESOPHAGEAL ECHOCARDIOGRAM (TEE);  Surgeon: Purcell Nails, MD;  Location: [facility] OR;  Service: Open Heart Surgery;  Laterality: N/A;  . TEE WITHOUT CARDIOVERSION N/A 05/01/2020   Procedure: TRANSESOPHAGEAL ECHOCARDIOGRAM (TEE);  Surgeon: Jodelle Red, MD;  Location: [facility] ENDOSCOPY;  Service: Cardiovascular;  Laterality: N/A;    Current Outpatient Medications  Medication Sig Dispense Refill  . acetaminophen (TYLENOL) 500 MG tablet Take 1,000 mg by mouth every 8 (eight) hours as needed for headache.    . Ascorbic Acid (VITAMIN C) 500 MG CAPS Take 1 capsule by mouth daily.    [name] aspirin 81 MG EC tablet Take 1 tablet (81 mg total) by mouth daily.    . fluticasone (FLONASE) 50 MCG/ACT nasal spray Place 2 sprays into both nostrils daily as needed for allergies or rhinitis.     [name] gabapentin (NEURONTIN) 300 MG capsule Take 600 mg by mouth at bedtime.     [name] lisinopril (ZESTRIL) 5 MG tablet TAKE 1 TABLET BY MOUTH DAILY 90 tablet 2  . loratadine (CLARITIN) 10 MG tablet Take 10 mg by mouth daily.    . metoprolol succinate (TOPROL-XL) 50 MG 24 hr tablet Take 50 mg by mouth daily. Take with or immediately following a meal.    . Multiple Vitamins-Minerals (ONE-A-DAY 50 PLUS PO) Take 1 tablet by mouth daily.    [name] omeprazole (PRILOSEC OTC) 20 MG tablet Take 20 mg by mouth daily.    Letta Pate VERIO test strip TEST BS ONCE D  3  . polyethylene glycol (MIRALAX / GLYCOLAX) 17 g packet Take 17 g by mouth daily. 14 each 0  . rosuvastatin (CRESTOR) 10 MG tablet Take 10 mg by mouth daily.    . sitaGLIPtin-metformin (JANUMET) 50-1000 MG tablet Take 1 tablet by mouth 2 (two) times daily with a meal.     . nitroGLYCERIN (NITROSTAT) 0.4 MG SL tablet Place 1 tablet (0.4 mg total) under the tongue  every 5 (five) minutes as needed for chest pain. 25 tablet 6   No current facility-administered medications for this visit.    Allergies  Allergen Reactions  . Oxycodone Nausea And Vomiting    Social History   Socioeconomic History  . Marital status: Married    Spouse name: Not on file  . Number of children: 3  . Years of education: Not on file  . Highest education level: Not on file  Occupational History  . Occupation: Retired-[employer]  . Smoking status: Former Smoker    Packs/day: 1.00    Years: 20.00    Pack years: 20.00    Types: Cigarettes    Quit date: 12/12/1992    Years since quitting: 28.0  . Smokeless tobacco: Never Used  Vaping Use  . Vaping  Use: Never used  Substance and Sexual Activity  . Alcohol use: Yes    Alcohol/week: 1.0 standard drink    Types: 1 Cans of beer per week  . Drug use: No  . Sexual activity: Not Currently  Other Topics Concern  . Not on file  Social History Narrative  . Not on file   Social Determinants of Health   Financial Resource Strain: Not on file  Food Insecurity: Not on file  Transportation Needs: Not on file  Physical Activity: Not on file  Stress: Not on file  Social Connections: Not on file  Intimate Partner Violence: Not on file    Family History  Problem Relation Age of Onset  . Cancer Mother   . Heart attack Father 74  . CAD Brother     Review of Systems:  As stated in the HPI and otherwise negative.   BP 124/70   Pulse 72   Ht 5\' 9"  (1.753 m)   Wt 225 lb (102.1 kg)   SpO2 97%   BMI 33.23 kg/m   Physical Examination: General: Well developed, well nourished, NAD  HEENT: OP clear, mucus membranes moist  SKIN: warm, dry. No rashes. Neuro: No focal deficits  Musculoskeletal: Muscle strength 5/5 all ext  Psychiatric: Mood and affect normal  Neck: No JVD, no carotid bruits, no thyromegaly, no lymphadenopathy.  Lungs:Clear bilaterally, no wheezes, rhonci,  crackles Cardiovascular: Regular rate and rhythm. Soft systolic murmur.  Abdomen:Soft. Bowel sounds present. Non-tender.  Extremities: No lower extremity edema. Pulses are 2 + in the bilateral DP/PT.  Echo 07/13/20: 1. Left ventricular ejection fraction, by estimation, is 55%. The left  ventricle has normal function. The left ventricle has no regional wall  motion abnormalities. There is moderate left ventricular hypertrophy. Left  ventricular diastolic parameters are  consistent with Grade I diastolic dysfunction (impaired relaxation).  Septal bounce consistent with prior cardiac surgery.  2. Right ventricular systolic function is normal. The right ventricular  size is normal. Tricuspid regurgitation signal is inadequate for assessing  PA pressure.  3. The mitral valve is normal in structure. Mild mitral valve  regurgitation. No evidence of mitral stenosis.  4. Bioprosthetic aortic valve. The aortic valve is thickened, especially  noted in the parasternal long axis view. Note history of recently  diagnosed endocarditis by TEE, needs clinical correlation. Aortic valve  regurgitation is not visualized. No aortic  stenosis is present. Aortic valve mean gradient measures 6.0 mmHg.  5. The inferior vena cava is normal in size with greater than 50%  respiratory variability, suggesting right atrial pressure of 3 mmHg.  6. Chiari network noted in right atrium.   Cardiac cath 08/25/17: Findings: 1. Multivessel coronary artery disease including diffuse mild to moderate proximal to mid LAD disease, 50-60% in-stent restenosis of the distal LAD stent, moderate-caliber OM2 branch with 80% proximal disease, and dominant RCA with chronic total occlusion of very small posterior descending artery. Disease predominantly affects small/distal vessels. 2. Severe aortic stenosis with mean transvalvular gradient by pullback of 66 mmHg and peak-to-peak gradient of 71 mmHg. 3. Normal left and right heart  filling pressures. 4. Normal Fick cardiac output.  EKG:  EKG is ordered today. The ekg ordered today demonstrates sinus rhythm, 1st degree AV block.   Recent Labs: 05/04/2020: ALT 40; BUN 15; Creatinine, Ser 1.11; Hemoglobin 11.2; Platelets 232; Potassium 5.1; Sodium 131   Lipid Panel    Component Value Date/Time   CHOL 105 05/07/2013 0854   TRIG 110.0 05/07/2013  0854   HDL 35.80 (L) 05/07/2013 0854   CHOLHDL 3 05/07/2013 0854   VLDL 22.0 05/07/2013 0854   LDLCALC 47 05/07/2013 0854     Wt Readings from Last 3 Encounters:  12/09/20 225 lb (102.1 kg)  09/28/20 222 lb 6.4 oz (100.9 kg)  06/09/20 208 lb (94.3 kg)     Assessment and Plan:   1. Aortic stenosis s/p bioprosthetic AVR: TEE in May 2021 and TTE August 2021 with thickening of one of the leaflets of the bioprosthetic aortic valve. He had been treated for endocarditis in May 2021. No evidence of valvular insufficiency or stenosis by echo August 2021. Now with dyspnea. I do not hear a loud murmur on exam. I will repeat the echo now to assess his valve given his new dyspnea. Continue ASA and SBE prophylaxis as necessary.   2. CAD s/p CABG without angina: No chest pain. Continue ASA and statin.   3. Hyperlipidemia: Lipids followed in primary care. Continue statin.     4. Carotid artery disease: Followed in vascular surgery office  5. Dyspnea: He is not volume overloaded. Will check CBC, BMET and BNP today. Will arrange an echo to assess his bioprosthetic aortic valve. May need to consider cardiac cath at f/u but he has had no chest pain to suggest advancement of his CAD. He had angina prior to his stenting procedures.   Labs/ tests ordered today include:   Orders Placed This Encounter  Procedures  . Basic metabolic panel  . CBC  . Pro b natriuretic peptide  . EKG 12-Lead  . ECHOCARDIOGRAM COMPLETE   Disposition:   Follow up with me in 2 weeks.   Signed, Lauree Chandler, MD 12/09/2020 4:24 PM    [facility] [place], [place], [place]  [address] Phone: [phone]; Fax: [phone]

## 2020-12-09 NOTE — Patient Instructions (Signed)
Medication Instructions:  Your physician recommends that you continue on your current medications as directed. Please refer to the Current Medication list given to you today.  *If you need a refill on your cardiac medications before your next appointment, please call your pharmacy*   Lab Work: BMET, CBC and Pro BNP today  If you have labs (blood work) drawn today and your tests are completely normal, you will receive your results only by: [name] MyChart Message (if you have MyChart) OR . A paper copy in the mail If you have any lab test that is abnormal or we need to change your treatment, we will call you to review the results.   Testing/Procedures: Your physician has requested that you have an echocardiogram. Echocardiography is a painless test that uses sound waves to create images of your heart. It provides your doctor with information about the size and shape of your heart and how well your heart's chambers and valves are working. This procedure takes approximately one hour. There are no restrictions for this procedure.   Follow-Up: At [facility], you and your health needs are our priority.  As part of our continuing mission to provide you with exceptional heart care, we have created designated Provider Care Teams.  These Care Teams include your primary Cardiologist (physician) and Advanced Practice Providers (APPs -  Physician Assistants and Nurse Practitioners) who all work together to provide you with the care you need, when you need it.  We recommend signing up for the patient portal called "MyChart".  Sign up information is provided on this After Visit Summary.  MyChart is used to connect with patients for Virtual Visits (Telemedicine).  Patients are able to view lab/test results, encounter notes, upcoming appointments, etc.  Non-urgent messages can be sent to your provider as well.   To learn more about what you can do with MyChart, go to [email or web address].    Your next  appointment:   2-3 week(s)  The format for your next appointment:   In Person  Provider:   You may see Verne Carrow, MD or one of the following Advanced Practice Providers on your designated Care Team:    Ronie Spies, PA-C  Jacolyn Reedy, PA-C    Other Instructions

## 2020-12-10 ENCOUNTER — Inpatient Hospital Stay (HOSPITAL_COMMUNITY)
Admission: EM | Admit: 2020-12-10 | Discharge: 2020-12-15 | DRG: 809 | Disposition: A | Discharge status: Home / Self Care | Payer: PPO | Attending: Internal Medicine | Admitting: Internal Medicine

## 2020-12-10 ENCOUNTER — Emergency Department (HOSPITAL_COMMUNITY): Payer: PPO

## 2020-12-10 ENCOUNTER — Encounter (HOSPITAL_COMMUNITY): Payer: Self-pay | Admitting: Emergency Medicine

## 2020-12-10 DIAGNOSIS — Z20822 Contact with and (suspected) exposure to covid-19: Secondary | ICD-10-CM | POA: Diagnosis present

## 2020-12-10 DIAGNOSIS — Z951 Presence of aortocoronary bypass graft: Secondary | ICD-10-CM

## 2020-12-10 DIAGNOSIS — Z953 Presence of xenogenic heart valve: Secondary | ICD-10-CM | POA: Diagnosis not present

## 2020-12-10 DIAGNOSIS — Z7984 Long term (current) use of oral hypoglycemic drugs: Secondary | ICD-10-CM | POA: Diagnosis not present

## 2020-12-10 DIAGNOSIS — I10 Essential (primary) hypertension: Secondary | ICD-10-CM | POA: Diagnosis present

## 2020-12-10 DIAGNOSIS — K5730 Diverticulosis of large intestine without perforation or abscess without bleeding: Secondary | ICD-10-CM | POA: Diagnosis not present

## 2020-12-10 DIAGNOSIS — N179 Acute kidney failure, unspecified: Secondary | ICD-10-CM | POA: Diagnosis not present

## 2020-12-10 DIAGNOSIS — I350 Nonrheumatic aortic (valve) stenosis: Secondary | ICD-10-CM | POA: Diagnosis present

## 2020-12-10 DIAGNOSIS — Z7982 Long term (current) use of aspirin: Secondary | ICD-10-CM

## 2020-12-10 DIAGNOSIS — Z79899 Other long term (current) drug therapy: Secondary | ICD-10-CM | POA: Diagnosis not present

## 2020-12-10 DIAGNOSIS — Z955 Presence of coronary angioplasty implant and graft: Secondary | ICD-10-CM | POA: Diagnosis not present

## 2020-12-10 DIAGNOSIS — K2270 Barrett's esophagus without dysplasia: Secondary | ICD-10-CM | POA: Diagnosis present

## 2020-12-10 DIAGNOSIS — I2510 Atherosclerotic heart disease of native coronary artery without angina pectoris: Secondary | ICD-10-CM | POA: Diagnosis present

## 2020-12-10 DIAGNOSIS — D61818 Other pancytopenia: Principal | ICD-10-CM | POA: Diagnosis present

## 2020-12-10 DIAGNOSIS — E785 Hyperlipidemia, unspecified: Secondary | ICD-10-CM | POA: Diagnosis present

## 2020-12-10 DIAGNOSIS — Z8673 Personal history of transient ischemic attack (TIA), and cerebral infarction without residual deficits: Secondary | ICD-10-CM

## 2020-12-10 DIAGNOSIS — I361 Nonrheumatic tricuspid (valve) insufficiency: Secondary | ICD-10-CM | POA: Diagnosis not present

## 2020-12-10 DIAGNOSIS — Z6832 Body mass index (BMI) 32.0-32.9, adult: Secondary | ICD-10-CM

## 2020-12-10 DIAGNOSIS — E669 Obesity, unspecified: Secondary | ICD-10-CM | POA: Diagnosis present

## 2020-12-10 DIAGNOSIS — K219 Gastro-esophageal reflux disease without esophagitis: Secondary | ICD-10-CM | POA: Diagnosis not present

## 2020-12-10 DIAGNOSIS — E860 Dehydration: Secondary | ICD-10-CM | POA: Diagnosis not present

## 2020-12-10 DIAGNOSIS — D649 Anemia, unspecified: Secondary | ICD-10-CM | POA: Diagnosis present

## 2020-12-10 DIAGNOSIS — D509 Iron deficiency anemia, unspecified: Secondary | ICD-10-CM | POA: Diagnosis not present

## 2020-12-10 DIAGNOSIS — Z8249 Family history of ischemic heart disease and other diseases of the circulatory system: Secondary | ICD-10-CM | POA: Diagnosis not present

## 2020-12-10 DIAGNOSIS — D692 Other nonthrombocytopenic purpura: Secondary | ICD-10-CM | POA: Diagnosis present

## 2020-12-10 DIAGNOSIS — Z952 Presence of prosthetic heart valve: Secondary | ICD-10-CM | POA: Diagnosis not present

## 2020-12-10 DIAGNOSIS — Z87891 Personal history of nicotine dependence: Secondary | ICD-10-CM | POA: Diagnosis not present

## 2020-12-10 DIAGNOSIS — E119 Type 2 diabetes mellitus without complications: Secondary | ICD-10-CM | POA: Diagnosis not present

## 2020-12-10 DIAGNOSIS — Z885 Allergy status to narcotic agent status: Secondary | ICD-10-CM

## 2020-12-10 DIAGNOSIS — G4733 Obstructive sleep apnea (adult) (pediatric): Secondary | ICD-10-CM | POA: Diagnosis present

## 2020-12-10 DIAGNOSIS — R0600 Dyspnea, unspecified: Secondary | ICD-10-CM | POA: Diagnosis not present

## 2020-12-10 DIAGNOSIS — R918 Other nonspecific abnormal finding of lung field: Secondary | ICD-10-CM | POA: Diagnosis not present

## 2020-12-10 DIAGNOSIS — I714 Abdominal aortic aneurysm, without rupture: Secondary | ICD-10-CM | POA: Diagnosis not present

## 2020-12-10 DIAGNOSIS — D469 Myelodysplastic syndrome, unspecified: Secondary | ICD-10-CM | POA: Diagnosis not present

## 2020-12-10 DIAGNOSIS — R0602 Shortness of breath: Secondary | ICD-10-CM | POA: Diagnosis not present

## 2020-12-10 LAB — RESP PANEL BY RT-PCR (FLU A&B, COVID) ARPGX2
Influenza A by PCR: NEGATIVE
Influenza B by PCR: NEGATIVE
SARS Coronavirus 2 by RT PCR: NEGATIVE

## 2020-12-10 LAB — CBC WITH DIFFERENTIAL/PLATELET
Abs Immature Granulocytes: 0.01 10*3/uL (ref 0.00–0.07)
Basophils Absolute: 0 10*3/uL (ref 0.0–0.1)
Basophils Relative: 1 %
Eosinophils Absolute: 0 10*3/uL (ref 0.0–0.5)
Eosinophils Relative: 1 %
HCT: 23.6 % — ABNORMAL LOW (ref 39.0–52.0)
Hemoglobin: 7.5 g/dL — ABNORMAL LOW (ref 13.0–17.0)
Immature Granulocytes: 1 %
Lymphocytes Relative: 29 %
Lymphs Abs: 0.6 10*3/uL — ABNORMAL LOW (ref 0.7–4.0)
MCH: 33.9 pg (ref 26.0–34.0)
MCHC: 31.8 g/dL (ref 30.0–36.0)
MCV: 106.8 fL — ABNORMAL HIGH (ref 80.0–100.0)
Monocytes Absolute: 0.2 10*3/uL (ref 0.1–1.0)
Monocytes Relative: 12 %
Neutro Abs: 1.1 10*3/uL — ABNORMAL LOW (ref 1.7–7.7)
Neutrophils Relative %: 56 %
Platelets: 34 10*3/uL — ABNORMAL LOW (ref 150–400)
RBC: 2.21 MIL/uL — ABNORMAL LOW (ref 4.22–5.81)
RDW: 16.4 % — ABNORMAL HIGH (ref 11.5–15.5)
WBC: 1.9 10*3/uL — ABNORMAL LOW (ref 4.0–10.5)
nRBC: 0 % (ref 0.0–0.2)

## 2020-12-10 LAB — CBC
Hematocrit: 21.8 % — ABNORMAL LOW (ref 37.5–51.0)
Hemoglobin: 7.6 g/dL — ABNORMAL LOW (ref 13.0–17.7)
MCH: 34.2 pg — ABNORMAL HIGH (ref 26.6–33.0)
MCHC: 34.9 g/dL (ref 31.5–35.7)
MCV: 98 fL — ABNORMAL HIGH (ref 79–97)
Platelets: 33 10*3/uL — CL (ref 150–450)
RBC: 2.22 x10E6/uL — CL (ref 4.14–5.80)
RDW: 15.2 % (ref 11.6–15.4)
WBC: 2.6 10*3/uL — ABNORMAL LOW (ref 3.4–10.8)

## 2020-12-10 LAB — BASIC METABOLIC PANEL
BUN/Creatinine Ratio: 17 (ref 10–24)
BUN: 24 mg/dL (ref 8–27)
CO2: 20 mmol/L (ref 20–29)
Calcium: 9.3 mg/dL (ref 8.6–10.2)
Chloride: 97 mmol/L (ref 96–106)
Creatinine, Ser: 1.44 mg/dL — ABNORMAL HIGH (ref 0.76–1.27)
GFR calc Af Amer: 56 mL/min/{1.73_m2} — ABNORMAL LOW (ref >59)
GFR calc non Af Amer: 48 mL/min/{1.73_m2} — ABNORMAL LOW (ref >59)
Glucose: 89 mg/dL (ref 65–99)
Potassium: 4.3 mmol/L (ref 3.5–5.2)
Sodium: 131 mmol/L — ABNORMAL LOW (ref 134–144)

## 2020-12-10 LAB — COMPREHENSIVE METABOLIC PANEL
ALT: 20 U/L (ref 0–44)
AST: 32 U/L (ref 15–41)
Albumin: 4.4 g/dL (ref 3.5–5.0)
Alkaline Phosphatase: 30 U/L — ABNORMAL LOW (ref 38–126)
Anion gap: 9 (ref 5–15)
BUN: 21 mg/dL (ref 8–23)
CO2: 22 mmol/L (ref 22–32)
Calcium: 9.1 mg/dL (ref 8.9–10.3)
Chloride: 101 mmol/L (ref 98–111)
Creatinine, Ser: 1.4 mg/dL — ABNORMAL HIGH (ref 0.61–1.24)
GFR, Estimated: 53 mL/min — ABNORMAL LOW (ref >60)
Glucose, Bld: 154 mg/dL — ABNORMAL HIGH (ref 70–99)
Potassium: 4.3 mmol/L (ref 3.5–5.1)
Sodium: 132 mmol/L — ABNORMAL LOW (ref 135–145)
Total Bilirubin: 0.9 mg/dL (ref 0.3–1.2)
Total Protein: 7.5 g/dL (ref 6.5–8.1)

## 2020-12-10 LAB — URINALYSIS, ROUTINE W REFLEX MICROSCOPIC
Bilirubin Urine: NEGATIVE
Glucose, UA: NEGATIVE mg/dL
Hgb urine dipstick: NEGATIVE
Ketones, ur: NEGATIVE mg/dL
Leukocytes,Ua: NEGATIVE
Nitrite: NEGATIVE
Protein, ur: NEGATIVE mg/dL
Specific Gravity, Urine: 1.005 (ref 1.005–1.030)
pH: 6 (ref 5.0–8.0)

## 2020-12-10 LAB — PROTIME-INR
INR: 1.1 (ref 0.8–1.2)
Prothrombin Time: 13.6 seconds (ref 11.4–15.2)

## 2020-12-10 LAB — PRO B NATRIURETIC PEPTIDE: NT-Pro BNP: 415 pg/mL — ABNORMAL HIGH (ref 0–376)

## 2020-12-10 LAB — PREPARE RBC (CROSSMATCH)

## 2020-12-10 LAB — LACTIC ACID, PLASMA: Lactic Acid, Venous: 1 mmol/L (ref 0.5–1.9)

## 2020-12-10 LAB — CBG MONITORING, ED: Glucose-Capillary: 118 mg/dL — ABNORMAL HIGH (ref 70–99)

## 2020-12-10 MED ORDER — PIPERACILLIN-TAZOBACTAM 3.375 G IVPB 30 MIN
3.3750 g | Freq: Once | INTRAVENOUS | Status: AC
  Administered 2020-12-10: 3.375 g via INTRAVENOUS
  Filled 2020-12-10: qty 50

## 2020-12-10 MED ORDER — INSULIN ASPART 100 UNIT/ML ~~LOC~~ SOLN
0.0000 [IU] | Freq: Three times a day (TID) | SUBCUTANEOUS | Status: DC
  Administered 2020-12-11: 5 [IU] via SUBCUTANEOUS
  Administered 2020-12-14: 3 [IU] via SUBCUTANEOUS
  Administered 2020-12-15: 2 [IU] via SUBCUTANEOUS

## 2020-12-10 MED ORDER — METOPROLOL SUCCINATE ER 50 MG PO TB24
50.0000 mg | ORAL_TABLET | Freq: Every day | ORAL | Status: DC
  Administered 2020-12-10 – 2020-12-14 (×5): 50 mg via ORAL
  Filled 2020-12-10 (×4): qty 1
  Filled 2020-12-10: qty 2

## 2020-12-10 MED ORDER — SODIUM CHLORIDE 0.9 % IV SOLN: INTRAVENOUS | Status: AC

## 2020-12-10 MED ORDER — ROSUVASTATIN CALCIUM 5 MG PO TABS
10.0000 mg | ORAL_TABLET | Freq: Every day | ORAL | Status: DC
  Administered 2020-12-10 – 2020-12-14 (×5): 10 mg via ORAL
  Filled 2020-12-10 (×5): qty 2

## 2020-12-10 MED ORDER — FERROUS SULFATE 325 (65 FE) MG PO TABS
325.0000 mg | ORAL_TABLET | Freq: Every day | ORAL | Status: DC
  Administered 2020-12-11 – 2020-12-15 (×5): 325 mg via ORAL
  Filled 2020-12-10 (×5): qty 1

## 2020-12-10 MED ORDER — VANCOMYCIN HCL 2000 MG/400ML IV SOLN
2000.0000 mg | Freq: Once | INTRAVENOUS | Status: AC
  Administered 2020-12-10: 2000 mg via INTRAVENOUS
  Filled 2020-12-10: qty 400

## 2020-12-10 MED ORDER — SODIUM CHLORIDE 0.9 % IV SOLN
10.0000 mL/h | Freq: Once | INTRAVENOUS | Status: AC
  Administered 2020-12-11: 10 mL/h via INTRAVENOUS

## 2020-12-10 MED ORDER — LORATADINE 10 MG PO TABS
10.0000 mg | ORAL_TABLET | Freq: Every day | ORAL | Status: DC
  Administered 2020-12-11 – 2020-12-15 (×5): 10 mg via ORAL
  Filled 2020-12-10 (×5): qty 1

## 2020-12-10 MED ORDER — INSULIN ASPART 100 UNIT/ML ~~LOC~~ SOLN: 0.0000 [IU] | Freq: Every day | SUBCUTANEOUS | Status: DC

## 2020-12-10 MED ORDER — POLYETHYLENE GLYCOL 3350 17 G PO PACK: 17.0000 g | PACK | Freq: Every day | ORAL | Status: DC | PRN

## 2020-12-10 MED ORDER — GABAPENTIN 300 MG PO CAPS
600.0000 mg | ORAL_CAPSULE | Freq: Every day | ORAL | Status: DC
  Administered 2020-12-10 – 2020-12-14 (×5): 600 mg via ORAL
  Filled 2020-12-10 (×5): qty 2

## 2020-12-10 MED ORDER — SODIUM CHLORIDE 0.9 % IV BOLUS
1000.0000 mL | Freq: Once | INTRAVENOUS | Status: AC
  Administered 2020-12-10: 1000 mL via INTRAVENOUS

## 2020-12-10 MED ORDER — VANCOMYCIN HCL 750 MG/150ML IV SOLN
750.0000 mg | Freq: Two times a day (BID) | INTRAVENOUS | Status: DC
  Administered 2020-12-11 – 2020-12-13 (×5): 750 mg via INTRAVENOUS
  Filled 2020-12-10 (×5): qty 150

## 2020-12-10 MED ORDER — PANTOPRAZOLE SODIUM 40 MG PO TBEC
40.0000 mg | DELAYED_RELEASE_TABLET | Freq: Every day | ORAL | Status: DC
  Administered 2020-12-11 – 2020-12-14 (×4): 40 mg via ORAL
  Filled 2020-12-10 (×4): qty 1

## 2020-12-10 MED ORDER — FLUTICASONE PROPIONATE 50 MCG/ACT NA SUSP
2.0000 | Freq: Every morning | NASAL | Status: DC
  Administered 2020-12-12 – 2020-12-15 (×4): 2 via NASAL
  Filled 2020-12-10: qty 16

## 2020-12-10 MED ORDER — PIPERACILLIN-TAZOBACTAM 3.375 G IVPB
3.3750 g | Freq: Three times a day (TID) | INTRAVENOUS | Status: DC
  Administered 2020-12-11 – 2020-12-13 (×8): 3.375 g via INTRAVENOUS
  Filled 2020-12-10 (×8): qty 50

## 2020-12-10 MED ORDER — ACETAMINOPHEN 325 MG PO TABS
650.0000 mg | ORAL_TABLET | Freq: Four times a day (QID) | ORAL | Status: DC | PRN
  Administered 2020-12-14: 650 mg via ORAL
  Filled 2020-12-10: qty 2

## 2020-12-10 NOTE — ED Provider Notes (Signed)
[facility] EMERGENCY DEPARTMENT Provider Note   CSN: [id number] Arrival date & time: 12/10/20  1239     History Chief Complaint  Patient presents with  . Abnormal Lab    Theodore Jenkins is a 74 y.o. male with a pmhx ofsevere aortic stenosis status post bioprostheticaortic valve replacement in 2018, CAD status post PCI LAD 2005 and CABG 2018 LIMA-LAD, SVG-OM,CVA s/p right carotid endarterectomy with subsequent right internal carotid artery occlusion, Barrett's esophagitis, AAA, diabetes, hypertension, hyperlipidemia, GERD, obesity  presenting to emergency department with pancytopenia.  The patient reports he had progressively worsening fatigue, dyspnea, low energy and lightheadedness for the past several days or week.  He has been unable to exercise on a bicycle in the morning as he normally does.  He called his cardiologist ran routine test yesterday, noted that he was having pancytopenia, with anemia and a hemoglobin of 7.6.  His platelets are also low.  He was told to come to the ER today for evaluation.  He reports he is compliant with his iron pill and has chronically black stool but has not noticed any frank blood in stool.  He denies any history of leukemia, cancer, radiation or chemo treatment.  He does report a history of nosebleeds and had a nosebleed earlier this week but not actively bleeding.  He denies headache.  He has never had a blood transfusion or platelet transfusion in the past.  Does have a history of sepsis endocarditis in May 2021 (streptococcus infantarius), with TEE on 05/01/20 showing thickening of one briopresthetic valve - completed antibiotics with IV rocephin and gentamicin through 06/11/20.  Last echo on 07/13/20 showing some persistent thickening of aortic valve.  HPI     Past Medical History:  Diagnosis Date  . Aortic stenosis    Echo (08/2017): Severe AS with mean gradient of 42 mmHg.  . Barrett's esophagus   . CAD (coronary artery  disease) 06/06/2011   Remote stent to the LAD in 2005 // Last cath in 2010 showing diffuse CAD, small PD that is occluded and fills with left to right collaterals, 60-70% 1st OM, with normal EF. Managed medically // Myoview 3/18:  Normal perfusion. LVEF 55% with normal wall motion. This is a low risk study.  . Complication of anesthesia   . Diabetes mellitus   . ED (erectile dysfunction)   . Family history of adverse reaction to anesthesia 10/23/2017   sister had history of malignant hyperthermia when she was 72, she is 72 years old now  . GERD (gastroesophageal reflux disease)   . Hyperlipidemia   . Hypertension   . IHD (ischemic heart disease)    prior stenting of the LAD in December of 2005; with last cath in 2010 showing diffuse CAD with normal LV function. He is managed medically.   . Malignant hyperthermia    sister had reaction concerning for malignant hyperthermia (~ 1988)  . Neuromuscular disorder ([place]) 10/23/2017   lumbar 4 disc in back causing nerve pain, on gabapentin  . Obesity   . Obstructive sleep apnea on CPAP   . PONV (postoperative nausea and vomiting)   . S/P aortic valve replacement with bioprosthetic valve 10/26/2017   25 mm [facility] Ease bovine pericardial tissue valve  . S/P coronary artery stent placement 2005   LAD    Patient Active Problem List   Diagnosis Date Noted  . Pancytopenia ([place]) 12/10/2020  . Acute bacterial endocarditis   . Hyponatremia 04/30/2020  . Bacteremia 04/29/2020  .  S/P AVR (aortic valve replacement) 02/15/2018  . S/P aortic valve replacement with bioprosthetic valve  10/26/2017  . S/P CABG x 2 10/26/2017  . Type 2 diabetes mellitus with complication, without long-term current use of insulin ([place])   . Accelerating angina ([place]) 08/18/2017  . Carotid artery disease, unspecified laterality ([place]) 08/08/2016  . Exertional chest pain 10/03/2014  . Aortic valve stenosis 05/03/2014  . ED (erectile dysfunction) 03/20/2012  . Coronary  artery disease involving native coronary artery of native heart without angina pectoris 06/06/2011  . Hyperlipidemia LDL goal <70 06/06/2011  . Essential hypertension 06/06/2011  . Obesities, morbid ([place]) 06/06/2011    Past Surgical History:  Procedure Laterality Date  . AORTIC VALVE REPLACEMENT N/A 10/26/2017   Procedure: AORTIC VALVE REPLACEMENT (AVR), using Magna Ease 25;  Surgeon: Rexene Alberts, MD;  Location: [facility];  Service: Open Heart Surgery;  Laterality: N/A;  . CARDIAC CATHETERIZATION  2010   NORMAL LV FUNCTION. DIFFUSE CORONARY DISEASE WITH THE DISTAL PORTION OF THE SMALL POSTERIOR DESCENDING VESSEL OCCLUDED AND FILLING WITH THE RIGHT COLLATERALS, 60-70% STENOSIS IN THE FIRST OBTUSE MARGINAL VESSEL AND DIFFUSE SMALL VESSEL DISEASE  . CAROTID ENDARTERECTOMY    . CIRCUMCISION  1985  . CORONARY ARTERY BYPASS GRAFT N/A 10/26/2017   Procedure: CORONARY ARTERY BYPASS GRAFTING (CABG) x two , using left internal mammary artery and right leg greater saphenous vein harvested endoscopically;  Surgeon: Rexene Alberts, MD;  Location: [facility];  Service: Open Heart Surgery;  Laterality: N/A;  . CORONARY STENT PLACEMENT  2005  . RIGHT/LEFT HEART CATH AND CORONARY ANGIOGRAPHY N/A 08/25/2017   Procedure: RIGHT/LEFT HEART CATH AND CORONARY ANGIOGRAPHY;  Surgeon: Nelva Bush, MD;  [facility];  Service: Cardiovascular;  Laterality: N/A;  . rotator cuff surgery Right   . TEE WITHOUT CARDIOVERSION N/A 10/26/2017   Procedure: TRANSESOPHAGEAL ECHOCARDIOGRAM (TEE);  Surgeon: Rexene Alberts, MD;  Location: [facility];  Service: Open Heart Surgery;  Laterality: N/A;  . TEE WITHOUT CARDIOVERSION N/A 05/01/2020   Procedure: TRANSESOPHAGEAL ECHOCARDIOGRAM (TEE);  Surgeon: Buford Dresser, MD;  Location: [facility] ENDOSCOPY;  Service: Cardiovascular;  Laterality: N/A;       Family History  Problem Relation Age of Onset  . Cancer Mother   . Heart attack Father 7  . CAD Brother      Social History   Tobacco Use  . Smoking status: Former Smoker    Packs/day: 1.00    Years: 20.00    Pack years: 20.00    Types: Cigarettes    Quit date: 12/12/1992    Years since quitting: 28.0  . Smokeless tobacco: Never Used  Vaping Use  . Vaping Use: Never used  Substance Use Topics  . Alcohol use: Yes    Alcohol/week: 1.0 standard drink    Types: 1 Cans of beer per week  . Drug use: No    Home Medications Prior to Admission medications   Medication Sig Start Date End Date Taking? Authorizing Provider  acetaminophen (TYLENOL) 500 MG tablet Take 1,000 mg by mouth every 8 (eight) hours as needed for mild pain (or headaches).   Yes [provider]  amoxicillin (AMOXIL) 500 MG capsule Take 2,000 mg by mouth See admin instructions. Take 2,000 mg by mouth one hour prior to dental appointments   Yes [provider]  ascorbic acid (VITAMIN C) 500 MG tablet Take 500 mg by mouth daily with breakfast.   Yes [provider]  aspirin 81 MG EC  tablet Take 1 tablet (81 mg total) by mouth daily. 03/19/18  Yes Rexene Alberts, MD  ferrous sulfate 325 (65 FE) MG tablet Take 325 mg by mouth daily with breakfast.   Yes [provider]  fluticasone (FLONASE) 50 MCG/ACT nasal spray Place 2 sprays into both nostrils in the morning.   Yes [provider]  gabapentin (NEURONTIN) 300 MG capsule Take 600 mg by mouth at bedtime.    Yes [provider]  lisinopril (ZESTRIL) 5 MG tablet TAKE 1 TABLET BY MOUTH DAILY Patient taking differently: Take 5 mg by mouth in the morning. 09/02/20  Yes Burnell Blanks, MD  loratadine (CLARITIN) 10 MG tablet Take 10 mg by mouth daily.   Yes [provider]  metoprolol succinate (TOPROL-XL) 50 MG 24 hr tablet Take 50 mg by mouth at bedtime.   Yes [provider]  Multiple Vitamins-Minerals (ONE-A-DAY 50 PLUS PO) Take 1 tablet by mouth daily with breakfast.   Yes [provider]   nitroGLYCERIN (NITROSTAT) 0.4 MG SL tablet Place 1 tablet (0.4 mg total) under the tongue every 5 (five) minutes as needed for chest pain. 04/19/19 04/29/20 Yes Burnell Blanks, MD  polyethylene glycol (MIRALAX / GLYCOLAX) 17 g packet Take 17 g by mouth daily. Patient taking differently: Take 17 g by mouth every other day. 05/04/20  Yes Donne Hazel, MD  PRILOSEC OTC 20 MG tablet Take 20 mg by mouth daily at 6 PM.   Yes [provider]  rosuvastatin (CRESTOR) 10 MG tablet Take 10 mg by mouth at bedtime.   Yes [provider]  sitaGLIPtin-metformin (JANUMET) 50-1000 MG tablet Take 1 tablet by mouth 2 (two) times daily with a meal.  12/19/16  Yes [provider]  [facility] VERIO test strip 1 each by Other route daily. 12/29/17   [provider]    Allergies    Oxycodone  Review of Systems   Review of Systems  Constitutional: Positive for fatigue. Negative for chills and fever.  HENT: Negative for ear pain and sore throat.   Eyes: Negative for pain and visual disturbance.  Respiratory: Positive for shortness of breath. Negative for cough.   Cardiovascular: Negative for chest pain and palpitations.  Gastrointestinal: Negative for abdominal pain and vomiting.  Genitourinary: Negative for dysuria and hematuria.  Musculoskeletal: Positive for arthralgias and myalgias.  Skin: Negative for color change and rash.  Neurological: Positive for light-headedness. Negative for syncope.  Psychiatric/Behavioral: Negative for agitation and confusion.  All other systems reviewed and are negative.   Physical Exam Updated Vital Signs BP (!) 115/58   Pulse 74   Temp 98.4 F (36.9 C) (Oral)   Resp 19   Ht _0  (1.753 m)   Wt 100.2 kg   SpO2 97%   BMI 32.64 kg/m   Physical Exam Vitals and nursing note reviewed.  Constitutional:      Appearance: He is well-developed and well-nourished. He is obese.  HENT:     Head: Normocephalic and atraumatic.  Eyes:      Conjunctiva/sclera: Conjunctivae normal.  Cardiovascular:     Rate and Rhythm: Normal rate and regular rhythm.     Pulses: Normal pulses.     Heart sounds: Murmur heard.    Pulmonary:     Effort: Pulmonary effort is normal. No respiratory distress.     Breath sounds: Normal breath sounds.  Abdominal:     General: There is no distension.     Palpations: Abdomen  is soft.     Tenderness: There is no abdominal tenderness. There is no guarding.  Musculoskeletal:        General: No edema.     Cervical back: Neck supple.  Skin:    General: Skin is warm and dry.  Neurological:     General: No focal deficit present.     Mental Status: He is alert and oriented to person, place, and time.  Psychiatric:        Mood and Affect: Mood and affect and mood normal.        Behavior: Behavior normal.     ED Results / Procedures / Treatments   Labs (all labs ordered are listed, but only abnormal results are displayed) Labs Reviewed  COMPREHENSIVE METABOLIC PANEL - Abnormal; Notable for the following components:      Result Value   Sodium 132 (*)    Glucose, Bld 154 (*)    Creatinine, Ser 1.40 (*)    Alkaline Phosphatase 30 (*)    GFR, Estimated 53 (*)    All other components within normal limits  CBC WITH DIFFERENTIAL/PLATELET - Abnormal; Notable for the following components:   WBC 1.9 (*)    RBC 2.21 (*)    Hemoglobin 7.5 (*)    HCT 23.6 (*)    MCV 106.8 (*)    RDW 16.4 (*)    Platelets 34 (*)    Neutro Abs 1.1 (*)    Lymphs Abs 0.6 (*)    All other components within normal limits  URINALYSIS, ROUTINE W REFLEX MICROSCOPIC - Abnormal; Notable for the following components:   Color, Urine STRAW (*)    All other components within normal limits  RESP PANEL BY RT-PCR (FLU A&B, COVID) ARPGX2  CULTURE, BLOOD (SINGLE)  URINE CULTURE  CULTURE, BLOOD (SINGLE)  PROTIME-INR  LACTIC ACID, PLASMA  RETICULOCYTES  VITAMIN B12  LACTATE DEHYDROGENASE  SAVE SMEAR (SSMR)  TYPE AND SCREEN   PREPARE RBC (CROSSMATCH)    EKG None  Radiology DG Chest Portable 1 View  Result Date: 12/10/2020 CLINICAL DATA:  Anemia, epistaxis EXAM: PORTABLE CHEST 1 VIEW COMPARISON:  04/29/2020 FINDINGS: Single frontal view of the chest demonstrates a stable cardiac silhouette allowing for differences in technique and positioning. Aortic valve prosthesis and median sternotomy wires again noted. No airspace disease, effusion, or pneumothorax. No acute bony abnormalities. IMPRESSION: 1. No acute intrathoracic process. Electronically Signed   By: Randa Ngo M.D.   On: 12/10/2020 17:55    Procedures .Critical Care Performed by: Wyvonnia Dusky, MD Authorized by: Wyvonnia Dusky, MD   Critical care provider statement:    Critical care time (minutes):  40   Critical care was necessary to treat or prevent imminent or life-threatening deterioration of the following conditions:  Circulatory failure   Critical care was time spent personally by me on the following activities:  Discussions with consultants, evaluation of patient's response to treatment, examination of patient, ordering and performing treatments and interventions, ordering and review of laboratory studies, ordering and review of radiographic studies, pulse oximetry, re-evaluation of patient's condition, obtaining history from patient or surrogate and review of old charts   (including critical care time)  Medications Ordered in ED Medications  0.9 %  sodium chloride infusion (has no administration in time range)  piperacillin-tazobactam (ZOSYN) IVPB 3.375 g (has no administration in time range)    Followed by  piperacillin-tazobactam (ZOSYN) IVPB 3.375 g (has no administration in time range)  vancomycin (VANCOREADY) IVPB 2000  mg/400 mL (has no administration in time range)    Followed by  vancomycin (VANCOREADY) IVPB 750 mg/150 mL (has no administration in time range)  sodium chloride 0.9 % bolus 1,000 mL (0 mLs Intravenous  Stopped 12/10/20 2000)    ED Course  I have reviewed the triage vital signs and the nursing notes.  Pertinent labs & imaging results that were available during my care of the patient were reviewed by me and considered in my medical decision making (see chart for details).  This patient complains of fatigue, dyspnea with exertion for 1 week.  This involves an extensive number of treatment options, and is a complaint that carries with it a high risk of complications and morbidity.  The differential diagnosis includes symptomatic anemia vs infection vs other  Patient was found to be pancytopenic on lab testing yesterday by his cardiologist.  The primary concern with his history of prosthetic valve endocarditis several months ago would be a recurring or persistent infection.  Sepsis can cause pancytopenia.  He will need hemoglobin transfusion here in the emergency department for what I suspect is symptomatic anemia.  He was verbally consented, 2 units of blood were ordered.  We will also initiate an infection work-up.  I ordered, reviewed, and interpreted labs, which included covid/flu negative, WBC 1.9, Hgb 7.5 (baseline 11), platelets 34, Cr 1.4, INR 1.1, lactate 1.0.  UA unremarkable I ordered medication NS bolus and blood transfusion I ordered imaging studies which included dg chest  I independently visualized and interpreted imaging which showed no acute cardiac process and the monitor tracing which showed NSR Additional history was obtained from wife at bedside Previous records obtained and reviewed showing endocarditis management, treatment in 2021, most recent echocardiogram August 2021 I consulted Med/Oncology and discussed lab and imaging findings   After the interventions stated above, I reevaluated the patient and found vitals stable.  With lactate normal, I have a lower suspicion for sepsis.  I do feel blood cultures are reasonable as he may have new bacteremia - will discuss  antibiotics with inpatient team.  Plan to admit.   Clinical Course as of 12/10/20 2101  Thu Dec 10, 2020  1815 I spoke to Dr Elnoria Howard from Oncology who felt this could be consistent with sepsis/infection, but also with leukemia, myelodysplasia, or B12 deficiency.  Recommended adding on retic, LDH, b12 level, and agreed with hospitalization - recommended likely bone marrow biopsy while in hospital.  [MT]  2016 Admitted to hospitalist [MT]  2016 In discussion with hospitalist, agreed to initiate antibiotics broad spectrum at this time as empiric coverage for bacteremia. [MT]    Clinical Course User Index [MT] Thanya Cegielski, Carola Rhine, MD    Final Clinical Impression(s) / ED Diagnoses Final diagnoses:  Pancytopenia ([place])  Symptomatic anemia    Rx / DC Orders ED Discharge Orders    None       Wyvonnia Dusky, MD 12/10/20 2101

## 2020-12-10 NOTE — ED Triage Notes (Signed)
Pt reports hgb of 8 and platelets "dangerously low" when labs done at cards yesterday, currently on iron for hx of the same. Has been having nose bleeds. Denies blood thinners.

## 2020-12-10 NOTE — H&P (Signed)
History and Physical    Theodore Jenkins UGQ:[id number] DOB: [birth year] DOA: 12/10/2020  PCP: Karlene Einstein, MD Patient coming from: Home  Chief Complaint: Abnormal labs  HPI: Theodore Jenkins is a 74 y.o. male with medical history significant of CAD status post PCI in 2005 and CABG in 2018, severe aortic stenosis status post bioprosthetic AVR in 2018, endocarditis in May 2021, CVA status post right CEA with subsequent right internal carotid artery occlusion, GERD, Barrett's esophagus, AAA, hypertension, hyperlipidemia, non-insulin-dependent type II diabetes, OSA on CPAP presenting to the ED for evaluation of abnormal labs. Patient went to see cardiology yesterday and labs came back showing pancytopenia (WBC 2.6, hemoglobin 7.6, platelet count 33K).  Patient reports 1 month history of progressively worsening dyspnea on exertion and fatigue.  He was previously able to walk on the treadmill for 20 minutes every day without resting and now his exercise capacity is reduced to only 5 minutes at a time.  Denies dizziness or chest pain.  Denies fevers, chills, cough, nausea, vomiting, abdominal pain, or dysuria.  He takes a laxative at home which causes his stools to be soft.  He is fully vaccinated against Covid.  Patient reports history of epistaxis a few weeks ago which resolved with no recurrence of symptoms.  He has noticed purpura on his arms but no oral mucosal bleeding.  Reports having chronic dark stools which he attributes to taking an iron supplement for the past few months.  Denies hematochezia, hematemesis, or hematuria.  ED Course: Afebrile, vital signs stable. WBC 1.9 (ANC 1.1), hemoglobin 7.5, hematocrit 23.6, platelet 34K. Sodium 132, potassium 4.3, chloride 101, bicarb 22, BUN 21, creatinine 1.4, glucose 154. No elevation of LFTs. UA not suggestive of infection. Urine culture pending. INR 1.1. Lactic acid 1.0. Blood culture x2 pending. SARS-CoV-2 PCR test and influenza panel both pending.  Chest  x-ray negative for acute intrathoracic process.  Patient was given vancomycin, Zosyn, and 1 L normal saline bolus.  Review of Systems:  All systems reviewed and apart from history of presenting illness, are negative.  Past Medical History:  Diagnosis Date  . Aortic stenosis    Echo (08/2017): Severe AS with mean gradient of 42 mmHg.  . Barrett's esophagus   . CAD (coronary artery disease) 06/06/2011   Remote stent to the LAD in 2005 // Last cath in 2010 showing diffuse CAD, small PD that is occluded and fills with left to right collaterals, 60-70% 1st OM, with normal EF. Managed medically // Myoview 3/18:  Normal perfusion. LVEF 55% with normal wall motion. This is a low risk study.  . Complication of anesthesia   . Diabetes mellitus   . ED (erectile dysfunction)   . Family history of adverse reaction to anesthesia 10/23/2017   sister had history of malignant hyperthermia when she was 66, she is 72 years old now  . GERD (gastroesophageal reflux disease)   . Hyperlipidemia   . Hypertension   . IHD (ischemic heart disease)    prior stenting of the LAD in December of 2005; with last cath in 2010 showing diffuse CAD with normal LV function. He is managed medically.   . Malignant hyperthermia    sister had reaction concerning for malignant hyperthermia (~ 1988)  . Neuromuscular disorder ([place]) 10/23/2017   lumbar 4 disc in back causing nerve pain, on gabapentin  . Obesity   . Obstructive sleep apnea on CPAP   . PONV (postoperative nausea and vomiting)   . S/P aortic  valve replacement with bioprosthetic valve 10/26/2017   25 mm [facility] Ease bovine pericardial tissue valve  . S/P coronary artery stent placement 2005   LAD    Past Surgical History:  Procedure Laterality Date  . AORTIC VALVE REPLACEMENT N/A 10/26/2017   Procedure: AORTIC VALVE REPLACEMENT (AVR), using Magna Ease 25;  Surgeon: Rexene Alberts, MD;  Location: [place];  Service: Open Heart Surgery;  Laterality: N/A;  .  CARDIAC CATHETERIZATION  2010   NORMAL LV FUNCTION. DIFFUSE CORONARY DISEASE WITH THE DISTAL PORTION OF THE SMALL POSTERIOR DESCENDING VESSEL OCCLUDED AND FILLING WITH THE RIGHT COLLATERALS, 60-70% STENOSIS IN THE FIRST OBTUSE MARGINAL VESSEL AND DIFFUSE SMALL VESSEL DISEASE  . CAROTID ENDARTERECTOMY    . CIRCUMCISION  1985  . CORONARY ARTERY BYPASS GRAFT N/A 10/26/2017   Procedure: CORONARY ARTERY BYPASS GRAFTING (CABG) x two , using left internal mammary artery and right leg greater saphenous vein harvested endoscopically;  Surgeon: Rexene Alberts, MD;  Location: [place];  Service: Open Heart Surgery;  Laterality: N/A;  . CORONARY STENT PLACEMENT  2005  . RIGHT/LEFT HEART CATH AND CORONARY ANGIOGRAPHY N/A 08/25/2017   Procedure: RIGHT/LEFT HEART CATH AND CORONARY ANGIOGRAPHY;  Surgeon: Nelva Bush, MD;  Location: [place] [facility];  Service: Cardiovascular;  Laterality: N/A;  . rotator cuff surgery Right   . TEE WITHOUT CARDIOVERSION N/A 10/26/2017   Procedure: TRANSESOPHAGEAL ECHOCARDIOGRAM (TEE);  Surgeon: Rexene Alberts, MD;  Location: [place];  Service: Open Heart Surgery;  Laterality: N/A;  . TEE WITHOUT CARDIOVERSION N/A 05/01/2020   Procedure: TRANSESOPHAGEAL ECHOCARDIOGRAM (TEE);  Surgeon: Buford Dresser, MD;  Location: [facility] ENDOSCOPY;  Service: Cardiovascular;  Laterality: N/A;     reports that he quit smoking about 28 years ago. His smoking use included cigarettes. He has a 20.00 pack-year smoking history. He has never used smokeless tobacco. He reports current alcohol use of about 1.0 standard drink of alcohol per week. He reports that he does not use drugs.  Allergies  Allergen Reactions  . Oxycodone Nausea And Vomiting    Family History  Problem Relation Age of Onset  . Cancer Mother   . Heart attack Father 73  . CAD Brother     Prior to Admission medications   Medication Sig Start Date End Date Taking? Authorizing Provider  acetaminophen (TYLENOL) 500 MG  tablet Take 1,000 mg by mouth every 8 (eight) hours as needed for mild pain (or headaches).   Yes [provider]  amoxicillin (AMOXIL) 500 MG capsule Take 2,000 mg by mouth See admin instructions. Take 2,000 mg by mouth one hour prior to dental appointments   Yes [provider]  ascorbic acid (VITAMIN C) 500 MG tablet Take 500 mg by mouth daily with breakfast.   Yes [provider]  aspirin 81 MG EC tablet Take 1 tablet (81 mg total) by mouth daily. 03/19/18  Yes Rexene Alberts, MD  ferrous sulfate 325 (65 FE) MG tablet Take 325 mg by mouth daily with breakfast.   Yes [provider]  fluticasone (FLONASE) 50 MCG/ACT nasal spray Place 2 sprays into both nostrils in the morning.   Yes [provider]  gabapentin (NEURONTIN) 300 MG capsule Take 600 mg by mouth at bedtime.    Yes [provider]  lisinopril (ZESTRIL) 5 MG tablet TAKE 1 TABLET BY MOUTH DAILY Patient taking differently: Take 5 mg by mouth in the morning. 09/02/20  Yes Burnell Blanks, MD  loratadine (CLARITIN) 10  MG tablet Take 10 mg by mouth daily.   Yes [provider]  metoprolol succinate (TOPROL-XL) 50 MG 24 hr tablet Take 50 mg by mouth at bedtime.   Yes [provider]  Multiple Vitamins-Minerals (ONE-A-DAY 50 PLUS PO) Take 1 tablet by mouth daily with breakfast.   Yes [provider]  nitroGLYCERIN (NITROSTAT) 0.4 MG SL tablet Place 1 tablet (0.4 mg total) under the tongue every 5 (five) minutes as needed for chest pain. 04/19/19 04/29/20 Yes Burnell Blanks, MD  polyethylene glycol (MIRALAX / GLYCOLAX) 17 g packet Take 17 g by mouth daily. Patient taking differently: Take 17 g by mouth every other day. 05/04/20  Yes Donne Hazel, MD  PRILOSEC OTC 20 MG tablet Take 20 mg by mouth daily at 6 PM.   Yes [provider]  rosuvastatin (CRESTOR) 10 MG tablet Take 10 mg by mouth at bedtime.   Yes [provider]   sitaGLIPtin-metformin (JANUMET) 50-1000 MG tablet Take 1 tablet by mouth 2 (two) times daily with a meal.  12/19/16  Yes [provider]  [facility] VERIO test strip 1 each by Other route daily. 12/29/17   [provider]    Physical Exam: Vitals:   12/10/20 2030 12/10/20 2045 12/10/20 2100 12/10/20 2115  BP: (!) 111/56 (!) 115/58 129/62 134/69  Pulse: 76 74 74 77  Resp: (!) 22 19 17 18   Temp:      TempSrc:      SpO2: 99% 97% 99% 100%  Weight:      Height:        Physical Exam Constitutional:      General: He is not in acute distress. HENT:     Head: Normocephalic and atraumatic.  Eyes:     Extraocular Movements: Extraocular movements intact.     Conjunctiva/sclera: Conjunctivae normal.  Cardiovascular:     Rate and Rhythm: Normal rate and regular rhythm.     Pulses: Normal pulses.     Heart sounds: No murmur heard.   Pulmonary:     Effort: Pulmonary effort is normal. No respiratory distress.     Breath sounds: Normal breath sounds. No wheezing or rales.  Abdominal:     General: Bowel sounds are normal. There is no distension.     Palpations: Abdomen is soft.     Tenderness: There is no abdominal tenderness.  Musculoskeletal:        General: No swelling or tenderness.     Cervical back: Normal range of motion and neck supple.  Skin:    General: Skin is warm and dry.     Comments: Purpura noted on bilateral upper extremities  Neurological:     General: No focal deficit present.     Mental Status: He is alert and oriented to person, place, and time.     Labs on Admission: I have personally reviewed following labs and imaging studies  CBC: Recent Labs  Lab 12/09/20 1629 12/10/20 1305  WBC 2.6* 1.9*  NEUTROABS  --  1.1*  HGB 7.6* 7.5*  HCT 21.8* 23.6*  MCV 98* 106.8*  PLT 33* 34*   Basic Metabolic Panel: Recent Labs  Lab 12/09/20 1629 12/10/20 1305  NA 131* 132*  K 4.3 4.3  CL 97 101  CO2 20 22  GLUCOSE 89 154*  BUN 24 21   CREATININE 1.44* 1.40*  CALCIUM 9.3 9.1   GFR: Estimated Creatinine Clearance: 55.7 mL/min (A) (by C-G formula based on SCr of 1.4 mg/dL (  H)). Liver Function Tests: Recent Labs  Lab 12/10/20 1305  AST 32  ALT 20  ALKPHOS 30*  BILITOT 0.9  PROT 7.5  ALBUMIN 4.4   No results for input(s): LIPASE, AMYLASE in the last 168 hours. No results for input(s): AMMONIA in the last 168 hours. Coagulation Profile: Recent Labs  Lab 12/10/20 1743  INR 1.1   Cardiac Enzymes: No results for input(s): CKTOTAL, CKMB, CKMBINDEX, TROPONINI in the last 168 hours. BNP (last 3 results) Recent Labs    12/09/20 1629  PROBNP 415*   HbA1C: No results for input(s): HGBA1C in the last 72 hours. CBG: No results for input(s): GLUCAP in the last 168 hours. Lipid Profile: No results for input(s): CHOL, HDL, LDLCALC, TRIG, CHOLHDL, LDLDIRECT in the last 72 hours. Thyroid Function Tests: No results for input(s): TSH, T4TOTAL, FREET4, T3FREE, THYROIDAB in the last 72 hours. Anemia Panel: No results for input(s): VITAMINB12, FOLATE, FERRITIN, TIBC, IRON, RETICCTPCT in the last 72 hours. Urine analysis:    Component Value Date/Time   COLORURINE STRAW (A) 12/10/2020 1743   APPEARANCEUR CLEAR 12/10/2020 1743   LABSPEC 1.005 12/10/2020 1743   PHURINE 6.0 12/10/2020 1743   GLUCOSEU NEGATIVE 12/10/2020 1743   HGBUR NEGATIVE 12/10/2020 [place] 12/10/2020 1743   KETONESUR NEGATIVE 12/10/2020 1743   PROTEINUR NEGATIVE 12/10/2020 1743   NITRITE NEGATIVE 12/10/2020 1743   LEUKOCYTESUR NEGATIVE 12/10/2020 1743    Radiological Exams on Admission: DG Chest Portable 1 View  Result Date: 12/10/2020 CLINICAL DATA:  Anemia, epistaxis EXAM: PORTABLE CHEST 1 VIEW COMPARISON:  04/29/2020 FINDINGS: Single frontal view of the chest demonstrates a stable cardiac silhouette allowing for differences in technique and positioning. Aortic valve prosthesis and median sternotomy wires again noted. No  airspace disease, effusion, or pneumothorax. No acute bony abnormalities. IMPRESSION: 1. No acute intrathoracic process. Electronically Signed   By: Randa Ngo M.D.   On: 12/10/2020 17:55    EKG: Pending at this time.  Assessment/Plan Principal Problem:   Pancytopenia ([place]) Active Problems:   Coronary artery disease involving native coronary artery of native heart without angina pectoris   Essential hypertension   S/P aortic valve replacement with bioprosthetic valve    AKI (acute kidney injury) (HCC)   Pancytopenia: Previous labs done in May 2021 showing normal WBC count, hemoglobin 11.2, and normal platelet count. Labs done at cardiology office yesterday showing WBC 2.6, hemoglobin 7.6, platelet count 33K. Repeat labs done today showing WBC 1.9 (ANC 1.1), hemoglobin 7.5, platelet count 34K. Patient is not endorsing any symptoms of GI bleed.  He had epistaxis a few weeks ago which resolved with no recurrence.  No signs of active bleeding at present. Does have a history of prosthetic valve endocarditis several months ago, ?recurrence of infection. UA without signs of infection. Chest x-ray not suggestive of pneumonia. Covid test negative. No fever, tachycardia, or lactic acidosis to suggest sepsis. ED provider discussed the case with Dr. Marin Olp from oncology who felt that this could be consistent with sepsis/infection but could also possibly be leukemia, myelodysplasia, or B12 deficiency. Recommended checking reticulocyte count, LDH, B12 level. Recommended likely bone marrow biopsy while in the hospital. -Reticulocyte count, LDH, B12, folate count and peripheral blood smear ordered. Patient was given 2 units PRBCs in the ED. Will hold off giving a platelet transfusion given no signs of bleeding. Since plan is to do a bone marrow biopsy during this hospitalization, may need platelet transfusion if count drops below 20,000. Repeat CBC in  a.m. Further work-up per hematology/oncology, may need viral  serologies as well. -Broad-spectrum antibiotics started in the ED, continue.  Blood culture x2 pending.  Check procalcitonin level.  Mild AKI: Creatinine 1.4, previously ranging between 0.8-1.1 on labs done in May 2021. -Gentle IV fluid hydration. Check urine sodium and creatinine. Avoid nephrotoxic agents, hold home lisinopril. Repeat BMP in a.m.  Aortic stenosis status post bioprosthetic AVR: TEE in May 21 and TTE in August 2021 with thickening of one of the leaflets of the bioprosthetic aortic valve. He has been treated for endocarditis in May 2021. No evidence of valvular insufficiency or stenosis by echo done in August 2021. Seen at cardiology office yesterday and plan was to do a repeat echo to assess his valve. -Hold aspirin given anemia and thrombocytopenia. Repeat echo ordered.  CAD status post PCI and CABG: Not endorsing any chest pain. -Hold aspirin given anemia and thrombocytopenia.  Continue beta-blocker and statin.  Hyperlipidemia -Continue Crestor  Hypertension: Stable. -Continue home metoprolol. Hold lisinopril given mild AKI.  Non-insulin-dependent type 2 diabetes: A1c was 6.6 in May 2021. -Repeat A1c. Sliding scale insulin sensitive ACHS. Hold oral hypoglycemic agents.  OSA -Continue CPAP at night  DVT prophylaxis: SCDs Code Status: Patient wishes to be full code. Family Communication: No family available at this time. Disposition Plan: Status is: Inpatient  Remains inpatient appropriate because:Inpatient level of care appropriate due to severity of illness and Needs work-up for pancytopenia, possible bone marrow biopsy   Dispo: The patient is from: Home              Anticipated d/c is to: Home              Anticipated d/c date is: 3 days              Patient currently is not medically stable to d/c.  The medical decision making on this patient was of high complexity and the patient is at high risk for clinical deterioration, therefore this is a level 3  visit.  Shela Leff MD [facility]  If 7PM-7AM, please contact night-coverage [email or web address]  12/10/2020, 10:30 PM

## 2020-12-10 NOTE — Progress Notes (Signed)
Pharmacy Antibiotic Note  Theodore Jenkins is a 74 y.o. male admitted on 12/10/2020 with bacteremia.  Pharmacy has been consulted for Zosyn and Vancomycin dosing.   Height: 5\' 9"  (175.3 cm) Weight: 100.2 kg (221 lb) IBW/kg (Calculated) : 70.7  Temp (24hrs), Avg:98.3 F (36.8 C), Min:97.9 F (36.6 C), Max:98.7 F (37.1 C)  Recent Labs  Lab 12/09/20 1629 12/10/20 1305 12/10/20 1743  WBC 2.6* 1.9*  --   CREATININE 1.44* 1.40*  --   LATICACIDVEN  --   --  1.0    Estimated Creatinine Clearance: 55.7 mL/min (A) (by C-G formula based on SCr of 1.4 mg/dL (H)).    Allergies  Allergen Reactions  . Oxycodone Nausea And Vomiting    Antimicrobials this admission: 12/30 Zosyn >>  12/30 Vancomycin >>   Dose adjustments this admission: N/a  Microbiology results: Pending   Plan:  - Zosyn 3.375g IV x 1 dose over 30 min followed by Zosyn 3.375g IV every 8 hours infused over 4 hours  - Vancomycin 2000mg  IV x 1 dose  - Followed by Vancomycin 750mg  IV q12h - Goal trough 15-20 - Monitor patients renal function and urine output  - De-escalate ABX when appropriate   Thank you for allowing pharmacy to be a part of this patient's care.  1/31 PharmD. BCPS 12/10/2020 8:35 PM

## 2020-12-11 ENCOUNTER — Inpatient Hospital Stay (HOSPITAL_COMMUNITY): Payer: PPO

## 2020-12-11 DIAGNOSIS — I361 Nonrheumatic tricuspid (valve) insufficiency: Secondary | ICD-10-CM

## 2020-12-11 DIAGNOSIS — D61818 Other pancytopenia: Principal | ICD-10-CM

## 2020-12-11 DIAGNOSIS — R0602 Shortness of breath: Secondary | ICD-10-CM | POA: Diagnosis not present

## 2020-12-11 DIAGNOSIS — D649 Anemia, unspecified: Secondary | ICD-10-CM | POA: Diagnosis not present

## 2020-12-11 LAB — TYPE AND SCREEN
ABO/RH(D): A POS
Antibody Screen: NEGATIVE
Unit division: 0
Unit division: 0

## 2020-12-11 LAB — BPAM RBC
Blood Product Expiration Date: 202201242359
Blood Product Expiration Date: 202201242359
ISSUE DATE / TIME: 202112301931
ISSUE DATE / TIME: 202112302238
Unit Type and Rh: 6200
Unit Type and Rh: 6200

## 2020-12-11 LAB — BASIC METABOLIC PANEL
Anion gap: 10 (ref 5–15)
BUN: 18 mg/dL (ref 8–23)
CO2: 19 mmol/L — ABNORMAL LOW (ref 22–32)
Calcium: 8.8 mg/dL — ABNORMAL LOW (ref 8.9–10.3)
Chloride: 107 mmol/L (ref 98–111)
Creatinine, Ser: 1.38 mg/dL — ABNORMAL HIGH (ref 0.61–1.24)
GFR, Estimated: 54 mL/min — ABNORMAL LOW (ref >60)
Glucose, Bld: 102 mg/dL — ABNORMAL HIGH (ref 70–99)
Potassium: 4.2 mmol/L (ref 3.5–5.1)
Sodium: 136 mmol/L (ref 135–145)

## 2020-12-11 LAB — RETICULOCYTES
Immature Retic Fract: 19 % — ABNORMAL HIGH (ref 2.3–15.9)
RBC.: 2.89 MIL/uL — ABNORMAL LOW (ref 4.22–5.81)
Retic Count, Absolute: 80.3 10*3/uL (ref 19.0–186.0)
Retic Ct Pct: 2.8 % (ref 0.4–3.1)

## 2020-12-11 LAB — CBC
HCT: 28.4 % — ABNORMAL LOW (ref 39.0–52.0)
Hemoglobin: 9.4 g/dL — ABNORMAL LOW (ref 13.0–17.0)
MCH: 31.5 pg (ref 26.0–34.0)
MCHC: 33.1 g/dL (ref 30.0–36.0)
MCV: 95.3 fL (ref 80.0–100.0)
Platelets: 28 10*3/uL — CL (ref 150–400)
RBC: 2.98 MIL/uL — ABNORMAL LOW (ref 4.22–5.81)
RDW: 19.9 % — ABNORMAL HIGH (ref 11.5–15.5)
WBC: 2.6 10*3/uL — ABNORMAL LOW (ref 4.0–10.5)
nRBC: 0 % (ref 0.0–0.2)

## 2020-12-11 LAB — ECHOCARDIOGRAM COMPLETE
AR max vel: 2.71 cm2
AV Area VTI: 2.86 cm2
AV Area mean vel: 2.82 cm2
AV Mean grad: 8.7 mmHg
AV Peak grad: 16.9 mmHg
Ao pk vel: 2.05 m/s
Area-P 1/2: 2.91 cm2
Calc EF: 61 %
Height: 69 in
S' Lateral: 2.8 cm
Single Plane A2C EF: 62.7 %
Single Plane A4C EF: 59.6 %
Weight: 3534.41 oz

## 2020-12-11 LAB — GLUCOSE, CAPILLARY
Glucose-Capillary: 117 mg/dL — ABNORMAL HIGH (ref 70–99)
Glucose-Capillary: 121 mg/dL — ABNORMAL HIGH (ref 70–99)
Glucose-Capillary: 251 mg/dL — ABNORMAL HIGH (ref 70–99)
Glucose-Capillary: 70 mg/dL (ref 70–99)

## 2020-12-11 LAB — CBC WITH DIFFERENTIAL/PLATELET
Abs Immature Granulocytes: 0.02 10*3/uL (ref 0.00–0.07)
Basophils Absolute: 0 10*3/uL (ref 0.0–0.1)
Basophils Relative: 0 %
Eosinophils Absolute: 0 10*3/uL (ref 0.0–0.5)
Eosinophils Relative: 1 %
HCT: 27.4 % — ABNORMAL LOW (ref 39.0–52.0)
Hemoglobin: 9.5 g/dL — ABNORMAL LOW (ref 13.0–17.0)
Immature Granulocytes: 1 %
Lymphocytes Relative: 23 %
Lymphs Abs: 0.6 10*3/uL — ABNORMAL LOW (ref 0.7–4.0)
MCH: 32.6 pg (ref 26.0–34.0)
MCHC: 34.7 g/dL (ref 30.0–36.0)
MCV: 94.2 fL (ref 80.0–100.0)
Monocytes Absolute: 0.3 10*3/uL (ref 0.1–1.0)
Monocytes Relative: 11 %
Neutro Abs: 1.6 10*3/uL — ABNORMAL LOW (ref 1.7–7.7)
Neutrophils Relative %: 64 %
Platelets: 29 10*3/uL — CL (ref 150–400)
RBC: 2.91 MIL/uL — ABNORMAL LOW (ref 4.22–5.81)
RDW: 20 % — ABNORMAL HIGH (ref 11.5–15.5)
WBC: 2.5 10*3/uL — ABNORMAL LOW (ref 4.0–10.5)
nRBC: 0.8 % — ABNORMAL HIGH (ref 0.0–0.2)

## 2020-12-11 LAB — SAVE SMEAR(SSMR), FOR PROVIDER SLIDE REVIEW

## 2020-12-11 LAB — LACTATE DEHYDROGENASE: LDH: 140 U/L (ref 98–192)

## 2020-12-11 LAB — PROCALCITONIN: Procalcitonin: <0.1 ng/mL

## 2020-12-11 LAB — HEMOGLOBIN A1C
Hgb A1c MFr Bld: 5.6 % (ref 4.8–5.6)
Mean Plasma Glucose: 114.02 mg/dL

## 2020-12-11 LAB — FOLATE: Folate: 32.4 ng/mL (ref >5.9)

## 2020-12-11 LAB — VITAMIN B12: Vitamin B-12: 154 pg/mL — ABNORMAL LOW (ref 180–914)

## 2020-12-11 MED ORDER — CYANOCOBALAMIN 1000 MCG/ML IJ SOLN
1000.0000 ug | Freq: Every day | INTRAMUSCULAR | Status: AC
  Administered 2020-12-11 – 2020-12-15 (×5): 1000 ug via INTRAMUSCULAR
  Filled 2020-12-11 (×5): qty 1

## 2020-12-11 MED ORDER — IOHEXOL 300 MG/ML  SOLN
100.0000 mL | Freq: Once | INTRAMUSCULAR | Status: AC | PRN
  Administered 2020-12-11: 100 mL via INTRAVENOUS

## 2020-12-11 NOTE — Progress Notes (Signed)
PROGRESS NOTE  Theodore Jenkins XBJ:[id number] DOB: [birth year] DOA: 12/10/2020 PCP: Karlene Einstein, MD  HPI/Recap of past 24 hours:  Theodore Jenkins is a 74 y.o. male with medical history significant of CAD status post PCI in 2005 and CABG in 2018, severe aortic stenosis status post bioprosthetic AVR in 2018, endocarditis in May 2021, CVA status post right CEA with subsequent right internal carotid artery occlusion, GERD, Barrett's esophagus, AAA, hypertension, hyperlipidemia, non-insulin-dependent type II diabetes, OSA on CPAP presenting to the ED for evaluation of abnormal labs. Patient went to see cardiology yesterday and labs came back showing pancytopenia (WBC 2.6, hemoglobin 7.6, platelet count 33K).  Patient reports 1 month history of progressively worsening dyspnea on exertion and fatigue.  He was previously able to walk on the treadmill for 20 minutes every day without resting and now his exercise capacity is reduced to only 5 minutes at a time.  Denies dizziness or chest pain.  Denies fevers, chills, cough, nausea, vomiting, abdominal pain, or dysuria.  He takes a laxative at home which causes his stools to be soft.  He is fully vaccinated against Covid.  Patient reports history of epistaxis a few weeks ago which resolved with no recurrence of symptoms.  He has noticed purpura on his arms but no oral mucosal bleeding.  Reports having chronic dark stools which he attributes to taking an iron supplement for the past few months.  Denies hematochezia, hematemesis, or hematuria.  ED Course: Afebrile, vital signs stable. WBC 1.9 (ANC 1.1), hemoglobin 7.5, hematocrit 23.6, platelet 34K. Sodium 132, potassium 4.3, chloride 101, bicarb 22, BUN 21, creatinine 1.4, glucose 154. No elevation of LFTs. UA not suggestive of infection. Urine culture pending. INR 1.1. Lactic acid 1.0. Blood culture x2 pending. SARS-CoV-2 PCR test and influenza panel both pending.  Chest x-ray negative for acute intrathoracic  process.  Patient was given vancomycin, Zosyn, and 1 L normal saline bolus.  12/11/20: Seen in his room, his wife present.  He is post 2 unit PRBC transfusion.  No chest pain reported or dyspnea.  2D echo has been completed and the results are pending.  Was seen by heme oncology and IR with plan for bone marrow biopsy/aspiration on Monday.  Assessment/Plan: Principal Problem:   Pancytopenia ([place]) Active Problems:   Coronary artery disease involving native coronary artery of native heart without angina pectoris   Essential hypertension   S/P aortic valve replacement with bioprosthetic valve    AKI (acute kidney injury) (HCC)   Pancytopenia, unclear etiology Rule out active infective process Patient has a history of endocarditis A 2D echo has been completed, results are pending. Seen by hematology with plan for bone marrow biopsy by IR on Monday, 12/14/2020. He has received 2 unit PRBCs, hemoglobin is currently on 9.3, continue to monitor H&H Transfuse hemoglobin less than 7.0 No evidence of overt bleeding Platelet count 29K, WBC 2.5K. Appreciate specialists assistance, hematology oncology, IR. CT chest, abdomen and pelvis has been ordered by heme oncology to further evaluate, unclear of the etiology of his pancytopenia.  AKI, likely prerenal in the setting of dehydration Normal baseline creatinine and GFR Creatinine downtrending, 1.38 from 1.44 with GFR 54. Hold off IV fluids for now due to the recent blood transfusion to avoid volume overload Encourage increase in oral free fluid intake  Avoid nephrotoxins Monitor urine output Repeat renal panel in the morning  Low vitamin B12 level 154 Unclear if this has contributed to his pancytopenia Being repleted IM 1000  mcg x5 doses.  Aortic stenosis status post bioprosthetic AVR Had a TEE in May 21 and TTE in August 2021 with thickening of one of the leaflet of the bioprosthetic aortic valve.  He was treated for endocarditis in May  2021. He was seen at his cardiologist office on 12/09/2020 with plan for repeat echocardiogram.  A 2D echo has been completed, the results are pending.  Coronary artery disease status post PCI and CABG No reported anginal symptoms Continue to hold off home aspirin due to thrombocytopenia. Continue Toprol-XL and Crestor.  OSA Continue CPAP at night  Essential hypertension BP is at goal Continue home Toprol-XL Hold off lisinopril due to AKI  Type 2 diabetes well controlled Hemoglobin A1c 5.6 on 12/11/2020 Hold off home oral hypoglycemics Insulin sliding scale as needed for hyperglycemia    Code Status: Full code  Family Communication: Wife at bedside.  Disposition Plan: Likely will DC to home after his bone marrow biopsy, planned on Monday, 12/14/2020.   Consultants:  Heme oncology  Interventional radiology  Procedures:  2D echo  Antimicrobials:  Zosyn  IV vancomycin  DVT prophylaxis: SCDs.  Chemical DVT prophylaxis is currently contraindicated in the setting of severe thrombocytopenia.  Status is: Inpatient    Dispo: The patient is from: Home.              Anticipated d/c is to: Home.              Anticipated d/c date is: 12/16/2019              Patient currently not stable for discharge, ongoing management of pancytopenia        Objective: Vitals:   12/11/20 0000 12/11/20 0015 12/11/20 0030 12/11/20 0055  BP: (!) 144/77 (!) 143/78 136/78 134/72  Pulse: 73 73 69 77  Resp: 13 19 13 19   Temp:    98.1 F (36.7 C)  TempSrc:    Oral  SpO2: 96% 98% 99% 100%  Weight:    100.2 kg  Height:    5' 9"  (1.753 m)    Intake/Output Summary (Last 24 hours) at 12/11/2020 1251 Last data filed at 12/11/2020 7412 Gross per 24 hour  Intake 1374 ml  Output 2050 ml  Net -676 ml   Filed Weights   12/10/20 1244 12/11/20 0055  Weight: 100.2 kg 100.2 kg    Exam:  . General: 74 y.o. year-old male well developed well nourished in no acute distress.  Alert and  oriented x3. . Cardiovascular: Regular rate and rhythm with no rubs or gallops.  No thyromegaly or JVD noted.   [name] Respiratory: Clear to auscultation with no wheezes or rales. Good inspiratory effort. . Abdomen: Soft nontender nondistended with normal bowel sounds x4 quadrants. . Musculoskeletal: No lower extremity edema. 2/4 pulses in all 4 extremities. . Skin: No ulcerative lesions noted or rashes . Psychiatry: Mood is appropriate for condition and setting   Data Reviewed: CBC: Recent Labs  Lab 12/09/20 1629 12/10/20 1305 12/11/20 0444  WBC 2.6* 1.9* 2.5*  2.6*  NEUTROABS  --  1.1* 1.6*  HGB 7.6* 7.5* 9.5*  9.4*  HCT 21.8* 23.6* 27.4*  28.4*  MCV 98* 106.8* 94.2  95.3  PLT 33* 34* 29*  28*   Basic Metabolic Panel: Recent Labs  Lab 12/09/20 1629 12/10/20 1305 12/11/20 0444  NA 131* 132* 136  K 4.3 4.3 4.2  CL 97 101 107  CO2 20 22 19*  GLUCOSE 89 154* 102*  BUN  24 21 18   CREATININE 1.44* 1.40* 1.38*  CALCIUM 9.3 9.1 8.8*   GFR: Estimated Creatinine Clearance: 56.5 mL/min (A) (by C-G formula based on SCr of 1.38 mg/dL (H)). Liver Function Tests: Recent Labs  Lab 12/10/20 1305  AST 32  ALT 20  ALKPHOS 30*  BILITOT 0.9  PROT 7.5  ALBUMIN 4.4   No results for input(s): LIPASE, AMYLASE in the last 168 hours. No results for input(s): AMMONIA in the last 168 hours. Coagulation Profile: Recent Labs  Lab 12/10/20 1743  INR 1.1   Cardiac Enzymes: No results for input(s): CKTOTAL, CKMB, CKMBINDEX, TROPONINI in the last 168 hours. BNP (last 3 results) Recent Labs    12/09/20 1629  PROBNP 415*   HbA1C: Recent Labs    12/11/20 0444  HGBA1C 5.6   CBG: Recent Labs  Lab 12/10/20 2301 12/11/20 0755 12/11/20 1123  GLUCAP 118* 117* 121*   Lipid Profile: No results for input(s): CHOL, HDL, LDLCALC, TRIG, CHOLHDL, LDLDIRECT in the last 72 hours. Thyroid Function Tests: No results for input(s): TSH, T4TOTAL, FREET4, T3FREE, THYROIDAB in the last 72  hours. Anemia Panel: Recent Labs    12/11/20 0444  VITAMINB12 154*  FOLATE 32.4  RETICCTPCT 2.8   Urine analysis:    Component Value Date/Time   COLORURINE STRAW (A) 12/10/2020 1743   APPEARANCEUR CLEAR 12/10/2020 1743   LABSPEC 1.005 12/10/2020 1743   PHURINE 6.0 12/10/2020 1743   GLUCOSEU NEGATIVE 12/10/2020 1743   HGBUR NEGATIVE 12/10/2020 [place] 12/10/2020 1743   KETONESUR NEGATIVE 12/10/2020 1743   PROTEINUR NEGATIVE 12/10/2020 1743   NITRITE NEGATIVE 12/10/2020 1743   LEUKOCYTESUR NEGATIVE 12/10/2020 1743   Sepsis Labs: @LABRCNTIP (procalcitonin:4,lacticidven:4)  ) Recent Results (from the past 240 hour(s))  Blood culture (routine single)     Status: None (Preliminary result)   Collection Time: 12/10/20  5:43 PM   Specimen: BLOOD  Result Value Ref Range Status   Specimen Description BLOOD SITE NOT SPECIFIED  Final   Special Requests   Final    BOTTLES DRAWN AEROBIC AND ANAEROBIC Blood Culture results may not be optimal due to an excessive volume of blood received in culture bottles   Culture   Final    NO GROWTH < 12 HOURS Performed at [facility], [place] [address] [place], [place] [address]    Report Status PENDING  Incomplete  Resp Panel by RT-PCR (Flu A&B, Covid) Nasopharyngeal Swab     Status: None   Collection Time: 12/10/20  5:46 PM   Specimen: Nasopharyngeal Swab; Nasopharyngeal(NP) swabs in vial transport medium  Result Value Ref Range Status   SARS Coronavirus 2 by RT PCR NEGATIVE NEGATIVE Final    Comment: (NOTE) SARS-CoV-2 target nucleic acids are NOT DETECTED.  The SARS-CoV-2 RNA is generally detectable in upper respiratory specimens during the acute phase of infection. The lowest concentration of SARS-CoV-2 viral copies this assay can detect is 138 copies/mL. A negative result does not preclude SARS-Cov-2 infection and should not be used as the sole basis for treatment or other patient management decisions. A negative  result may occur with  improper specimen collection/handling, submission of specimen other than nasopharyngeal swab, presence of viral mutation(s) within the areas targeted by this assay, and inadequate number of viral copies(<138 copies/mL). A negative result must be combined with clinical observations, patient history, and epidemiological information. The expected result is Negative.  Fact Sheet for Patients:  EntrepreneurPulse.com.au  Fact Sheet for Healthcare Providers:  IncredibleEmployment.be  This test is no t yet approved or cleared by the [place] and  has been authorized for detection and/or diagnosis of SARS-CoV-2 by FDA under an Emergency Use Authorization (EUA). This EUA will remain  in effect (meaning this test can be used) for the duration of the COVID-19 declaration under Section 564(b)(1) of the Act, 21 U.S.C.section 360bbb-3(b)(1), unless the authorization is terminated  or revoked sooner.       Influenza A by PCR NEGATIVE NEGATIVE Final   Influenza B by PCR NEGATIVE NEGATIVE Final    Comment: (NOTE) The Xpert Xpress SARS-CoV-2/FLU/RSV plus assay is intended as an aid in the diagnosis of influenza from Nasopharyngeal swab specimens and should not be used as a sole basis for treatment. Nasal washings and aspirates are unacceptable for Xpert Xpress SARS-CoV-2/FLU/RSV testing.  Fact Sheet for Patients: EntrepreneurPulse.com.au  Fact Sheet for Healthcare Providers: IncredibleEmployment.be  This test is not yet approved or cleared by the [place] FDA and has been authorized for detection and/or diagnosis of SARS-CoV-2 by FDA under an Emergency Use Authorization (EUA). This EUA will remain in effect (meaning this test can be used) for the duration of the COVID-19 declaration under Section 564(b)(1) of the Act, 21 U.S.C. section 360bbb-3(b)(1), unless the authorization is  terminated or revoked.  Performed at [facility], [place] [address], [place], [place] [address]   Culture, blood (single)     Status: None (Preliminary result)   Collection Time: 12/10/20  8:00 PM   Specimen: BLOOD  Result Value Ref Range Status   Specimen Description BLOOD RIGHT ANTECUBITAL  Final   Special Requests   Final    BOTTLES DRAWN AEROBIC AND ANAEROBIC Blood Culture adequate volume   Culture   Final    NO GROWTH < 12 HOURS Performed at [facility], [place] [address] [place], [place] [address]    Report Status PENDING  Incomplete      Studies: DG Chest Portable 1 View  Result Date: 12/10/2020 CLINICAL DATA:  Anemia, epistaxis EXAM: PORTABLE CHEST 1 VIEW COMPARISON:  04/29/2020 FINDINGS: Single frontal view of the chest demonstrates a stable cardiac silhouette allowing for differences in technique and positioning. Aortic valve prosthesis and median sternotomy wires again noted. No airspace disease, effusion, or pneumothorax. No acute bony abnormalities. IMPRESSION: 1. No acute intrathoracic process. Electronically Signed   By: Randa Ngo M.D.   On: 12/10/2020 17:55    Scheduled Meds: . cyanocobalamin  1,000 mcg Intramuscular QPC breakfast  . ferrous sulfate  325 mg Oral Q breakfast  . fluticasone  2 spray Each Nare q AM  . gabapentin  600 mg Oral QHS  . insulin aspart  0-5 Units Subcutaneous QHS  . insulin aspart  0-9 Units Subcutaneous TID WC  . loratadine  10 mg Oral Daily  . metoprolol succinate  50 mg Oral QHS  . pantoprazole  40 mg Oral q1800  . rosuvastatin  10 mg Oral QHS    Continuous Infusions: . sodium chloride    . piperacillin-tazobactam (ZOSYN)  IV 3.375 g (12/11/20 1122)  . vancomycin 750 mg (12/11/20 0940)     LOS: 1 day     Kayleen Memos, MD [facility] Pager [phone]  If 7PM-7AM, please contact night-coverage [email or web address] Password [facility] 12/11/2020, 12:51 PM

## 2020-12-11 NOTE — Consult Note (Signed)
Chief Complaint: Patient was seen in consultation today for pancytopenia/bone marrow biopsy and aspiration.  Referring Physician(s): Volanda Napoleon (hematology/oncology)  Supervising Physician: Jacqulynn Cadet  Patient Status: [facility] - In-pt  History of Present Illness: Theodore Jenkins is a 74 y.o. male with a past medical history of hypertension, hyperlipidemia, CAD s/p LAD stenting 2005, aortic stenosis, valvular heart disease s/p aortic valve replacement 2018, AAA, GERD, Barrett's esophagus, ED, diabetes mellitus, obesity, and OSA on CPAP. He was referred to [facility] ED by his cardiologist on 12/10/2020 for management of abnormal labs (pancytopenia). He was admitted for further management. Hematology/oncology was consulted who recommended IR consult for possible bone marrow biopsy/aspiration to evaluate causes of pancytopenia.  IR consulted by Dr. Marin Olp for possible image-guided bone marrow biopsy/aspiration. Patient awake and alert sitting in bed with no complaints at this time. Wife at bedside. Denies fever, chills, chest pain, dyspnea, abdominal pain, or headache.   Past Medical History:  Diagnosis Date  . Aortic stenosis    Echo (08/2017): Severe AS with mean gradient of 42 mmHg.  . Barrett's esophagus   . CAD (coronary artery disease) 06/06/2011   Remote stent to the LAD in 2005 // Last cath in 2010 showing diffuse CAD, small PD that is occluded and fills with left to right collaterals, 60-70% 1st OM, with normal EF. Managed medically // Myoview 3/18:  Normal perfusion. LVEF 55% with normal wall motion. This is a low risk study.  . Complication of anesthesia   . Diabetes mellitus   . ED (erectile dysfunction)   . Family history of adverse reaction to anesthesia 10/23/2017   sister had history of malignant hyperthermia when she was 19, she is 72 years old now  . GERD (gastroesophageal reflux disease)   . Hyperlipidemia   . Hypertension   . IHD (ischemic heart disease)    prior  stenting of the LAD in December of 2005; with last cath in 2010 showing diffuse CAD with normal LV function. He is managed medically.   . Malignant hyperthermia    sister had reaction concerning for malignant hyperthermia (~ 1988)  . Neuromuscular disorder ([place]) 10/23/2017   lumbar 4 disc in back causing nerve pain, on gabapentin  . Obesity   . Obstructive sleep apnea on CPAP   . PONV (postoperative nausea and vomiting)   . S/P aortic valve replacement with bioprosthetic valve 10/26/2017   25 mm [facility] Ease bovine pericardial tissue valve  . S/P coronary artery stent placement 2005   LAD    Past Surgical History:  Procedure Laterality Date  . AORTIC VALVE REPLACEMENT N/A 10/26/2017   Procedure: AORTIC VALVE REPLACEMENT (AVR), using Magna Ease 25;  Surgeon: Rexene Alberts, MD;  Location: [place];  Service: Open Heart Surgery;  Laterality: N/A;  . CARDIAC CATHETERIZATION  2010   NORMAL LV FUNCTION. DIFFUSE CORONARY DISEASE WITH THE DISTAL PORTION OF THE SMALL POSTERIOR DESCENDING VESSEL OCCLUDED AND FILLING WITH THE RIGHT COLLATERALS, 60-70% STENOSIS IN THE FIRST OBTUSE MARGINAL VESSEL AND DIFFUSE SMALL VESSEL DISEASE  . CAROTID ENDARTERECTOMY    . CIRCUMCISION  1985  . CORONARY ARTERY BYPASS GRAFT N/A 10/26/2017   Procedure: CORONARY ARTERY BYPASS GRAFTING (CABG) x two , using left internal mammary artery and right leg greater saphenous vein harvested endoscopically;  Surgeon: Rexene Alberts, MD;  Location: [place];  Service: Open Heart Surgery;  Laterality: N/A;  . CORONARY STENT PLACEMENT  2005  . RIGHT/LEFT HEART CATH AND CORONARY ANGIOGRAPHY N/A  08/25/2017   Procedure: RIGHT/LEFT HEART CATH AND CORONARY ANGIOGRAPHY;  Surgeon: Nelva Bush, MD;  Location: [place] [facility];  Service: Cardiovascular;  Laterality: N/A;  . rotator cuff surgery Right   . TEE WITHOUT CARDIOVERSION N/A 10/26/2017   Procedure: TRANSESOPHAGEAL ECHOCARDIOGRAM (TEE);  Surgeon: Rexene Alberts, MD;   Location: [place];  Service: Open Heart Surgery;  Laterality: N/A;  . TEE WITHOUT CARDIOVERSION N/A 05/01/2020   Procedure: TRANSESOPHAGEAL ECHOCARDIOGRAM (TEE);  Surgeon: Buford Dresser, MD;  Location: [facility] ENDOSCOPY;  Service: Cardiovascular;  Laterality: N/A;    Allergies: Oxycodone  Medications: Prior to Admission medications   Medication Sig Start Date End Date Taking? Authorizing Provider  acetaminophen (TYLENOL) 500 MG tablet Take 1,000 mg by mouth every 8 (eight) hours as needed for mild pain (or headaches).   Yes [provider]  amoxicillin (AMOXIL) 500 MG capsule Take 2,000 mg by mouth See admin instructions. Take 2,000 mg by mouth one hour prior to dental appointments   Yes [provider]  ascorbic acid (VITAMIN C) 500 MG tablet Take 500 mg by mouth daily with breakfast.   Yes [provider]  aspirin 81 MG EC tablet Take 1 tablet (81 mg total) by mouth daily. 03/19/18  Yes Rexene Alberts, MD  ferrous sulfate 325 (65 FE) MG tablet Take 325 mg by mouth daily with breakfast.   Yes [provider]  fluticasone (FLONASE) 50 MCG/ACT nasal spray Place 2 sprays into both nostrils in the morning.   Yes [provider]  gabapentin (NEURONTIN) 300 MG capsule Take 600 mg by mouth at bedtime.    Yes [provider]  lisinopril (ZESTRIL) 5 MG tablet TAKE 1 TABLET BY MOUTH DAILY Patient taking differently: Take 5 mg by mouth in the morning. 09/02/20  Yes Burnell Blanks, MD  loratadine (CLARITIN) 10 MG tablet Take 10 mg by mouth daily.   Yes [provider]  metoprolol succinate (TOPROL-XL) 50 MG 24 hr tablet Take 50 mg by mouth at bedtime.   Yes [provider]  Multiple Vitamins-Minerals (ONE-A-DAY 50 PLUS PO) Take 1 tablet by mouth daily with breakfast.   Yes [provider]  nitroGLYCERIN (NITROSTAT) 0.4 MG SL tablet Place 1 tablet (0.4 mg total) under the tongue every 5 (five) minutes as needed for  chest pain. 04/19/19 04/29/20 Yes Burnell Blanks, MD  polyethylene glycol (MIRALAX / GLYCOLAX) 17 g packet Take 17 g by mouth daily. Patient taking differently: Take 17 g by mouth every other day. 05/04/20  Yes Donne Hazel, MD  PRILOSEC OTC 20 MG tablet Take 20 mg by mouth daily at 6 PM.   Yes [provider]  rosuvastatin (CRESTOR) 10 MG tablet Take 10 mg by mouth at bedtime.   Yes [provider]  sitaGLIPtin-metformin (JANUMET) 50-1000 MG tablet Take 1 tablet by mouth 2 (two) times daily with a meal.  12/19/16  Yes [provider]  [facility] VERIO test strip 1 each by Other route daily. 12/29/17   [provider]     Family History  Problem Relation Age of Onset  . Cancer Mother   . Heart attack Father 51  . CAD Brother     Social History   Socioeconomic History  . Marital status: Married    Spouse name: Not on file  . Number of children: 3  . Years of education: Not on file  . Highest education level: Not on file  Occupational History  . Occupation:  Retired-[employer]  Tobacco Use  . Smoking status: Former Smoker    Packs/day: 1.00    Years: 20.00    Pack years: 20.00    Types: Cigarettes    Quit date: 12/12/1992    Years since quitting: 28.0  . Smokeless tobacco: Never Used  Vaping Use  . Vaping Use: Never used  Substance and Sexual Activity  . Alcohol use: Yes    Alcohol/week: 1.0 standard drink    Types: 1 Cans of beer per week  . Drug use: No  . Sexual activity: Not Currently  Other Topics Concern  . Not on file  Social History Narrative  . Not on file   Social Determinants of Health   Financial Resource Strain: Not on file  Food Insecurity: Not on file  Transportation Needs: Not on file  Physical Activity: Not on file  Stress: Not on file  Social Connections: Not on file     Review of Systems: A 12 point ROS discussed and pertinent positives are indicated in the HPI above.  All other systems are  negative.  Review of Systems  Constitutional: Negative for chills and fever.  Respiratory: Negative for shortness of breath and wheezing.   Cardiovascular: Negative for chest pain and palpitations.  Gastrointestinal: Negative for abdominal pain.  Neurological: Negative for headaches.  Psychiatric/Behavioral: Negative for behavioral problems and confusion.    Vital Signs: BP 134/72 (BP Location: Left Arm)   Pulse 77   Temp 98.1 F (36.7 C) (Oral)   Resp 19   Ht 5' 9"  (1.753 m)   Wt 220 lb 14.4 oz (100.2 kg)   SpO2 100%   BMI 32.62 kg/m   Physical Exam Vitals and nursing note reviewed.  Constitutional:      General: He is not in acute distress.    Appearance: Normal appearance.  Cardiovascular:     Rate and Rhythm: Normal rate and regular rhythm.     Heart sounds: Normal heart sounds. No murmur heard.   Pulmonary:     Effort: Pulmonary effort is normal. No respiratory distress.     Breath sounds: Normal breath sounds. No wheezing.  Skin:    General: Skin is warm and dry.  Neurological:     Mental Status: He is alert and oriented to person, place, and time.      MD Evaluation Airway: WNL Heart: WNL Abdomen: WNL Chest/ Lungs: WNL ASA  Classification: 3 Mallampati/Airway Score: Two   Imaging: DG Chest Portable 1 View  Result Date: 12/10/2020 CLINICAL DATA:  Anemia, epistaxis EXAM: PORTABLE CHEST 1 VIEW COMPARISON:  04/29/2020 FINDINGS: Single frontal view of the chest demonstrates a stable cardiac silhouette allowing for differences in technique and positioning. Aortic valve prosthesis and median sternotomy wires again noted. No airspace disease, effusion, or pneumothorax. No acute bony abnormalities. IMPRESSION: 1. No acute intrathoracic process. Electronically Signed   By: Randa Ngo M.D.   On: 12/10/2020 17:55    Labs:  CBC: Recent Labs    05/04/20 0248 12/09/20 1629 12/10/20 1305 12/11/20 0444  WBC 9.7 2.6* 1.9* 2.5*  2.6*  HGB 11.2* 7.6* 7.5*  9.5*  9.4*  HCT 33.9* 21.8* 23.6* 27.4*  28.4*  PLT 232 33* 34* 29*  28*    COAGS: Recent Labs    12/10/20 1743  INR 1.1    BMP: Recent Labs    05/01/20 0232 05/02/20 0656 05/04/20 0248 12/09/20 1629 12/10/20 1305 12/11/20 0444  NA 132* 132* 131* 131* 132* 136  K 4.1 4.0 5.1 4.3 4.3 4.2  CL 104 101 95* 97 101 107  CO2 20* 23 26 20 22  19*  GLUCOSE 118* 115* 121* 89 154* 102*  BUN 13 11 15 24 21 18   CALCIUM 8.5* 8.9 9.1 9.3 9.1 8.8*  CREATININE 0.86 0.94 1.11 1.44* 1.40* 1.38*  GFRNONAA >60 >60 >60 48* 53* 54*  GFRAA >60 >60 >60 56*  --   --     LIVER FUNCTION TESTS: Recent Labs    05/01/20 0232 05/02/20 0656 05/04/20 0248 12/10/20 1305  BILITOT 0.5 0.9 0.5 0.9  AST 41 32 40 32  ALT 42 36 40 20  ALKPHOS 56 62 63 30*  PROT 6.4* 6.9 7.3 7.5  ALBUMIN 3.0* 3.1* 3.1* 4.4     Assessment and Plan:  Pancytopenia. Plan for image-guided bone marrow biopsy and aspiration in IR tentatively for Monday 12/14/2020 pending IR scheduling. Patient will be NPO at midnight prior to procedure. Afebrile. CBC with differential ordered for AM of procedure.  Risks and benefits discussed with the patient including, but not limited to bleeding, infection, damage to adjacent structures or low yield requiring additional tests. All of the patient's questions were answered, patient is agreeable to proceed. Consent signed and in IR control room.   Thank you for this interesting consult.  I greatly enjoyed meeting Theodore Jenkins and look forward to participating in their care.  A copy of this report was sent to the requesting provider on this date.  Electronically Signed: Earley Abide, PA-C 12/11/2020, 12:09 PM   I spent a total of 20 Minutes in face to face in clinical consultation, greater than 50% of which was counseling/coordinating care for pancytopenia/bone marrow biopsy and aspiration.

## 2020-12-11 NOTE — Plan of Care (Signed)
  Problem: Pain Managment: Goal: General experience of comfort will improve Outcome: Progressing   Problem: Safety: Goal: Ability to remain free from injury will improve Outcome: Progressing   Problem: Skin Integrity: Goal: Risk for impaired skin integrity will decrease Outcome: Completed/Met

## 2020-12-11 NOTE — Consult Note (Signed)
Referral MD  Reason for Referral: Pancytopenia; prosthetic aortic valve  Chief Complaint  Patient presents with  . Abnormal Lab  : My blood counts are low.  HPI: Theodore Jenkins is a very nice 74 year old white male.  He lives in [place].  He does have a very interesting history.  He had a bovine prosthetic aortic valve placed in 2018.  He subsequently was found to have endocarditis back in May of this year.  I just wonder if this was not somehow from the fact that he had his teeth extracted on the maxillary side last year.  At the time of his endocarditis, he was on antibiotics.  His lab work looked okay.  Over the past month or so, he has been feeling more weak.  He has had decreased energy.  He has had no obvious bleeding or bruising.  He has had no fever.  He has had no weight loss.  His appetite is been pretty good.  There is been no change in his medications.  He has not traveled anywhere.  He is retired.  He worked as a Dealer for [employer].  He subsequently was sent to the emergency room yesterday.  At the time, his white cell count was 1.9.  Hemoglobin 7.5.  Platelet count 34,000.  MCV was 107.  He had a normal white blood cell differential.  His electrolytes all looked okay.  His B12 was a little bit low at 154.  His creatinine was 1.4.  His bilirubin was 0.9.  His LDH is 140.  He has a corrected reticulocyte count of about 1%.  He is not a vegetarian.  He does not smoke.  Does not really drink.  There is no history of blood problems in the family.  We are seeing him for the pancytopenia.  Currently, his performance status is ECOG 1.     Past Medical History:  Diagnosis Date  . Aortic stenosis    Echo (08/2017): Severe AS with mean gradient of 42 mmHg.  . Barrett's esophagus   . CAD (coronary artery disease) 06/06/2011   Remote stent to the LAD in 2005 // Last cath in 2010 showing diffuse CAD, small PD that is occluded and fills with left to right  collaterals, 60-70% 1st OM, with normal EF. Managed medically // Myoview 3/18:  Normal perfusion. LVEF 55% with normal wall motion. This is a low risk study.  . Complication of anesthesia   . Diabetes mellitus   . ED (erectile dysfunction)   . Family history of adverse reaction to anesthesia 10/23/2017   sister had history of malignant hyperthermia when she was 2, she is 72 years old now  . GERD (gastroesophageal reflux disease)   . Hyperlipidemia   . Hypertension   . IHD (ischemic heart disease)    prior stenting of the LAD in December of 2005; with last cath in 2010 showing diffuse CAD with normal LV function. He is managed medically.   . Malignant hyperthermia    sister had reaction concerning for malignant hyperthermia (~ 1988)  . Neuromuscular disorder ([place]) 10/23/2017   lumbar 4 disc in back causing nerve pain, on gabapentin  . Obesity   . Obstructive sleep apnea on CPAP   . PONV (postoperative nausea and vomiting)   . S/P aortic valve replacement with bioprosthetic valve 10/26/2017   25 mm [facility] Ease bovine pericardial tissue valve  . S/P coronary artery stent placement 2005   LAD  :  Past Surgical History:  Procedure Laterality Date  . AORTIC VALVE REPLACEMENT N/A 10/26/2017   Procedure: AORTIC VALVE REPLACEMENT (AVR), using Magna Ease 25;  Surgeon: Rexene Alberts, MD;  Location: [place];  Service: Open Heart Surgery;  Laterality: N/A;  . CARDIAC CATHETERIZATION  2010   NORMAL LV FUNCTION. DIFFUSE CORONARY DISEASE WITH THE DISTAL PORTION OF THE SMALL POSTERIOR DESCENDING VESSEL OCCLUDED AND FILLING WITH THE RIGHT COLLATERALS, 60-70% STENOSIS IN THE FIRST OBTUSE MARGINAL VESSEL AND DIFFUSE SMALL VESSEL DISEASE  . CAROTID ENDARTERECTOMY    . CIRCUMCISION  1985  . CORONARY ARTERY BYPASS GRAFT N/A 10/26/2017   Procedure: CORONARY ARTERY BYPASS GRAFTING (CABG) x two , using left internal mammary artery and right leg greater saphenous vein harvested endoscopically;   Surgeon: Rexene Alberts, MD;  Location: [place];  Service: Open Heart Surgery;  Laterality: N/A;  . CORONARY STENT PLACEMENT  2005  . RIGHT/LEFT HEART CATH AND CORONARY ANGIOGRAPHY N/A 08/25/2017   Procedure: RIGHT/LEFT HEART CATH AND CORONARY ANGIOGRAPHY;  Surgeon: Nelva Bush, MD;  Location: [place] CV LAB;  Service: Cardiovascular;  Laterality: N/A;  . rotator cuff surgery Right   . TEE WITHOUT CARDIOVERSION N/A 10/26/2017   Procedure: TRANSESOPHAGEAL ECHOCARDIOGRAM (TEE);  Surgeon: Rexene Alberts, MD;  Location: [place];  Service: Open Heart Surgery;  Laterality: N/A;  . TEE WITHOUT CARDIOVERSION N/A 05/01/2020   Procedure: TRANSESOPHAGEAL ECHOCARDIOGRAM (TEE);  Surgeon: Buford Dresser, MD;  Location[facility] ENDOSCOPY;  Service: Cardiovascular;  Laterality: N/A;  :   Current Facility-Administered Medications:  .  0.9 %  sodium chloride infusion, 10 mL/hr, Intravenous, Once, Shela Leff, MD .  0.9 %  sodium chloride infusion, , Intravenous, Continuous, Shela Leff, MD, Last Rate: 75 mL/hr at 12/11/20 0025, New Bag at 12/11/20 0025 .  acetaminophen (TYLENOL) tablet 650 mg, 650 mg, Oral, Q6H PRN, Shela Leff, MD .  ferrous sulfate tablet 325 mg, 325 mg, Oral, Q breakfast, Rathore, Vasundhra, MD .  fluticasone (FLONASE) 50 MCG/ACT nasal spray 2 spray, 2 spray, Each Nare, q AM, Shela Leff, MD .  gabapentin (NEURONTIN) capsule 600 mg, 600 mg, Oral, QHS, Shela Leff, MD, 600 mg at 12/10/20 2308 .  insulin aspart (novoLOG) injection 0-5 Units, 0-5 Units, Subcutaneous, QHS, Rathore, Vasundhra, MD .  insulin aspart (novoLOG) injection 0-9 Units, 0-9 Units, Subcutaneous, TID WC, Shela Leff, MD .  loratadine (CLARITIN) tablet 10 mg, 10 mg, Oral, Daily, Shela Leff, MD .  metoprolol succinate (TOPROL-XL) 24 hr tablet 50 mg, 50 mg, Oral, QHS, Shela Leff, MD, 50 mg at 12/10/20 2309 .  pantoprazole (PROTONIX) EC tablet 40 mg, 40 mg,  Oral, q1800, Shela Leff, MD .  [COMPLETED] piperacillin-tazobactam (ZOSYN) IVPB 3.375 g, 3.375 g, Intravenous, Once, Stopped at 12/10/20 2150 **FOLLOWED BY** piperacillin-tazobactam (ZOSYN) IVPB 3.375 g, 3.375 g, Intravenous, Q8H, Rathore, Wandra Feinstein, MD, Last Rate: 12.5 mL/hr at 12/11/20 0102, 3.375 g at 12/11/20 0102 .  polyethylene glycol (MIRALAX / GLYCOLAX) packet 17 g, 17 g, Oral, Daily PRN, Shela Leff, MD .  rosuvastatin (CRESTOR) tablet 10 mg, 10 mg, Oral, QHS, Shela Leff, MD, 10 mg at 12/10/20 2309 .  [COMPLETED] vancomycin (VANCOREADY) IVPB 2000 mg/400 mL, 2,000 mg, Intravenous, Once, Stopped at 12/11/20 0030 **FOLLOWED BY** vancomycin (VANCOREADY) IVPB 750 mg/150 mL, 750 mg, Intravenous, Q12H, Shela Leff, MD:  . ferrous sulfate  325 mg Oral Q breakfast  . fluticasone  2 spray Each Nare q AM  . gabapentin  600 mg Oral QHS  . insulin  aspart  0-5 Units Subcutaneous QHS  . insulin aspart  0-9 Units Subcutaneous TID WC  . loratadine  10 mg Oral Daily  . metoprolol succinate  50 mg Oral QHS  . pantoprazole  40 mg Oral q1800  . rosuvastatin  10 mg Oral QHS  :  Allergies  Allergen Reactions  . Oxycodone Nausea And Vomiting  :  Family History  Problem Relation Age of Onset  . Cancer Mother   . Heart attack Father 27  . CAD Brother   :  Social History   Socioeconomic History  . Marital status: Married    Spouse name: Not on file  . Number of children: 3  . Years of education: Not on file  . Highest education level: Not on file  Occupational History  . Occupation: Retired-[employer]  . Smoking status: Former Smoker    Packs/day: 1.00    Years: 20.00    Pack years: 20.00    Types: Cigarettes    Quit date: 12/12/1992    Years since quitting: 28.0  . Smokeless tobacco: Never Used  Vaping Use  . Vaping Use: Never used  Substance and Sexual Activity  . Alcohol use: Yes    Alcohol/week: 1.0 standard drink     Types: 1 Cans of beer per week  . Drug use: No  . Sexual activity: Not Currently  Other Topics Concern  . Not on file  Social History Narrative  . Not on file   Social Determinants of Health   Financial Resource Strain: Not on file  Food Insecurity: Not on file  Transportation Needs: Not on file  Physical Activity: Not on file  Stress: Not on file  Social Connections: Not on file  Intimate Partner Violence: Not on file  :  Review of Systems  Constitutional: Positive for malaise/fatigue.  HENT: Negative.   Eyes: Negative.   Respiratory: Negative.   Cardiovascular: Negative.   Gastrointestinal: Negative.   Genitourinary: Negative.   Musculoskeletal: Negative.   Skin: Negative.   Neurological: Negative.   Endo/Heme/Allergies: Negative.   Psychiatric/Behavioral: Negative.      Exam:  This is a well-developed and well-nourished white male in no obvious distress.  Vital signs show temperature of 98.1.  Pulse 77.  Blood pressure 134/72.  Weight is 222 pounds.  Head and neck exam shows no scleral icterus.  He has some conjunctival pallor.  There is no oral lesions.  He has no adenopathy in the neck.  Thyroid is nonpalpable.  Lungs are clear bilaterally.  Cardiac exam regular rate and rhythm with no murmurs, rubs or bruits.  Abdomen is soft.  He is mildly obese.  He has no fluid wave.  There is no guarding or rebound tenderness.  I cannot palpate any liver or spleen tip.  Skin exam shows no rashes, ecchymoses or petechia.  Neurological exam shows no focal neurological deficits.  Extremities shows no clubbing, cyanosis or edema.  There is no joint swelling.  He has good range of motion of his joints.  Patient Vitals for the past 24 hrs:  BP Temp Temp src Pulse Resp SpO2 Height Weight  12/11/20 0055 134/72 98.1 F (36.7 C) Oral 77 19 100 % 5' 9"  (1.753 m) 220 lb 14.4 oz (100.2 kg)  12/11/20 0030 136/78 -- -- 69 13 99 % -- --  12/11/20 0015 (!) 143/78 -- -- 73 19 98 % -- --  12/11/20  0000 (!) 144/77 -- -- 73 13  96 % -- --  12/10/20 2345 140/73 -- -- 76 12 100 % -- --  12/10/20 2330 133/78 -- -- 73 15 98 % -- --  12/10/20 2303 (!) 145/76 98.2 F (36.8 C) -- 74 16 100 % -- --  12/10/20 2247 (!) 146/76 98.2 F (36.8 C) Oral 73 16 100 % -- --  12/10/20 2115 134/69 -- -- 77 18 100 % -- --  12/10/20 2100 129/62 -- -- 74 17 99 % -- --  12/10/20 2045 (!) 115/58 -- -- 74 19 97 % -- --  12/10/20 2030 (!) 111/56 -- -- 76 (!) 22 99 % -- --  12/10/20 2015 (!) 112/59 -- -- 76 20 100 % -- --  12/10/20 2012 123/62 98.4 F (36.9 C) Oral 78 17 98 % -- --  12/10/20 1945 (!) 142/55 -- -- 75 (!) 25 100 % -- --  12/10/20 1944 (!) 142/55 97.9 F (36.6 C) Oral 79 20 100 % -- --  12/10/20 1900 (!) 130/58 -- -- 67 (!) 21 98 % -- --  12/10/20 1815 135/68 -- -- 66 20 99 % -- --  12/10/20 1745 137/61 -- -- 64 (!) 21 99 % -- --  12/10/20 1715 (!) 142/74 -- -- 64 15 100 % -- --  12/10/20 1557 (!) 143/66 -- -- 64 18 99 % -- --  12/10/20 1427 (!) 121/57 -- -- 66 20 97 % -- --  12/10/20 1244 139/60 98.7 F (37.1 C) Oral 74 18 99 % 5' 9"  (1.753 m) 221 lb (100.2 kg)     Recent Labs    12/10/20 1305 12/11/20 0444  WBC 1.9* 2.6*  HGB 7.5* 9.4*  HCT 23.6* 28.4*  PLT 34* 28*   Recent Labs    12/10/20 1305 12/11/20 0444  NA 132* 136  K 4.3 4.2  CL 101 107  CO2 22 19*  GLUCOSE 154* 102*  BUN 21 18  CREATININE 1.40* 1.38*  CALCIUM 9.1 8.8*    Blood smear review: Normochromic and normocytic population of red blood cells.  I see no nucleated red blood cells.  He has no teardrop cells.  There is no rouleaux formation.  I see no schistocytes or spherocytes.  White blood cells appear mature.  There are no hyper segmented polys.  There are no blasts.  His lymphocytes appear normal.  Platelets are decreased in number.  Platelets are small.  They are well granulated.  Pathology: None    Assessment and Plan: Mr. Quintero is a nice 74 year old white male.  He has pancytopenia.  This clearly has  developed over a period of about 6 months.  Back in May, his blood counts were normal.  I would have to believe that this is an end up being myelodysplasia.  This would be a little bit of a quick onset for myelodysplasia.  I do not see anything obvious in the blood smear that would suggest leukemia.  I do not think there is any infiltrative process in the bone marrow.  I do not think this is related to the endocarditis that he had this had not been on antibiotics for a while.  He will clearly need to have a bone marrow biopsy done.  Unfortunately, I doubt this is good to be done until next week.  His B12 level is a little bit on the low side.  However, I do not believe this is the source of the pancytopenia.  I will get a CT scan to look  at his lymph nodes and liver and spleen.  I would not think this is anything lymphomatous.  He is very nice.  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  We will follow along.  He did get 2 units of blood already.  I would not transfuse him unless there is a significant drop in his blood counts.  I know that he will get excellent care from all staff on 2 W.  I appreciate their help.  Lattie Haw, MD  1 Corinthians 13:13

## 2020-12-11 NOTE — Progress Notes (Signed)
CRITICAL VALUE ALERT  Critical Value: Platelet 28  Date & Time Notied:  12/11/2020 0615  Provider Notified: T Opyd  Orders Received/Actions taken: Continue to monitor pt.

## 2020-12-11 NOTE — Progress Notes (Signed)
  Echocardiogram 2D Echocardiogram has been performed.  Theodore Jenkins 12/11/2020, 10:06 AM

## 2020-12-11 NOTE — Plan of Care (Signed)
  Problem: Education: Goal: Knowledge of General Education information will improve Description: Including pain rating scale, medication(s)/side effects and non-pharmacologic comfort measures Outcome: Progressing   Problem: Health Behavior/Discharge Planning: Goal: Ability to manage health-related needs will improve Outcome: Progressing   Problem: Clinical Measurements: Goal: Will remain free from infection Outcome: Progressing Goal: Diagnostic test results will improve Outcome: Progressing Goal: Respiratory complications will improve Outcome: Progressing Goal: Cardiovascular complication will be avoided Outcome: Progressing   Problem: Activity: Goal: Risk for activity intolerance will decrease Outcome: Progressing   Problem: Nutrition: Goal: Adequate nutrition will be maintained Outcome: Progressing   Problem: Coping: Goal: Level of anxiety will decrease Outcome: Progressing   Problem: Elimination: Goal: Will not experience complications related to bowel motility Outcome: Progressing Goal: Will not experience complications related to urinary retention Outcome: Progressing   Problem: Pain Managment: Goal: General experience of comfort will improve Outcome: Progressing   Problem: Safety: Goal: Ability to remain free from injury will improve Outcome: Progressing

## 2020-12-12 LAB — CBC WITH DIFFERENTIAL/PLATELET
Abs Immature Granulocytes: 0.01 10*3/uL (ref 0.00–0.07)
Basophils Absolute: 0 10*3/uL (ref 0.0–0.1)
Basophils Relative: 0 %
Eosinophils Absolute: 0 10*3/uL (ref 0.0–0.5)
Eosinophils Relative: 1 %
HCT: 29.6 % — ABNORMAL LOW (ref 39.0–52.0)
Hemoglobin: 9.4 g/dL — ABNORMAL LOW (ref 13.0–17.0)
Immature Granulocytes: 0 %
Lymphocytes Relative: 25 %
Lymphs Abs: 0.6 10*3/uL — ABNORMAL LOW (ref 0.7–4.0)
MCH: 31.8 pg (ref 26.0–34.0)
MCHC: 31.8 g/dL (ref 30.0–36.0)
MCV: 100 fL (ref 80.0–100.0)
Monocytes Absolute: 0.3 10*3/uL (ref 0.1–1.0)
Monocytes Relative: 14 %
Neutro Abs: 1.4 10*3/uL — ABNORMAL LOW (ref 1.7–7.7)
Neutrophils Relative %: 60 %
Platelets: 26 10*3/uL — CL (ref 150–400)
RBC: 2.96 MIL/uL — ABNORMAL LOW (ref 4.22–5.81)
RDW: 19.6 % — ABNORMAL HIGH (ref 11.5–15.5)
WBC: 2.4 10*3/uL — ABNORMAL LOW (ref 4.0–10.5)
nRBC: 0 % (ref 0.0–0.2)

## 2020-12-12 LAB — BASIC METABOLIC PANEL
Anion gap: 10 (ref 5–15)
BUN: 16 mg/dL (ref 8–23)
CO2: 20 mmol/L — ABNORMAL LOW (ref 22–32)
Calcium: 9.1 mg/dL (ref 8.9–10.3)
Chloride: 105 mmol/L (ref 98–111)
Creatinine, Ser: 1.39 mg/dL — ABNORMAL HIGH (ref 0.61–1.24)
GFR, Estimated: 54 mL/min — ABNORMAL LOW (ref >60)
Glucose, Bld: 101 mg/dL — ABNORMAL HIGH (ref 70–99)
Potassium: 4.1 mmol/L (ref 3.5–5.1)
Sodium: 135 mmol/L (ref 135–145)

## 2020-12-12 LAB — GLUCOSE, CAPILLARY
Glucose-Capillary: 118 mg/dL — ABNORMAL HIGH (ref 70–99)
Glucose-Capillary: 107 mg/dL — ABNORMAL HIGH (ref 70–99)
Glucose-Capillary: 86 mg/dL (ref 70–99)
Glucose-Capillary: 156 mg/dL — ABNORMAL HIGH (ref 70–99)

## 2020-12-12 MED ORDER — SODIUM CHLORIDE 0.9 % IV SOLN: INTRAVENOUS | Status: DC

## 2020-12-12 NOTE — Plan of Care (Signed)
Discussed with patient plan of care for the evening, pain management and blood sugar levels with some teach back displayed.   Problem: Education: Goal: Knowledge of General Education information will improve Description: Including pain rating scale, medication(s)/side effects and non-pharmacologic comfort measures Outcome: Progressing   Problem: Health Behavior/Discharge Planning: Goal: Ability to manage health-related needs will improve Outcome: Progressing

## 2020-12-12 NOTE — Progress Notes (Addendum)
PROGRESS NOTE  Theodore Jenkins DHW:[id number] DOB: [birth year] DOA: 12/10/2020 PCP: Karlene Einstein, MD  HPI/Recap of past 24 hours:  Theodore Jenkins is a 74 y.o. male with medical history significant of CAD status post PCI in 2005 and CABG in 2018, severe aortic stenosis status post bioprosthetic AVR in 2018, endocarditis in May 2021, CVA status post right CEA with subsequent right internal carotid artery occlusion, GERD, Barrett's esophagus, AAA, hypertension, hyperlipidemia, non-insulin-dependent type II diabetes, OSA on CPAP presenting to the ED for evaluation of abnormal labs. Patient went to see cardiology yesterday and labs came back showing pancytopenia (WBC 2.6, hemoglobin 7.6, platelet count 33K).  Patient reports 1 month history of progressively worsening dyspnea on exertion and fatigue.  He was previously able to walk on the treadmill for 20 minutes every day without resting and now his exercise capacity is reduced to only 5 minutes at a time.  Denies dizziness or chest pain.  Denies fevers, chills, cough, nausea, vomiting, abdominal pain, or dysuria.  He takes a laxative at home which causes his stools to be soft.  He is fully vaccinated against Covid.  Patient reports history of epistaxis a few weeks ago which resolved with no recurrence of symptoms.  He has noticed purpura on his arms but no oral mucosal bleeding.  Reports having chronic dark stools which he attributes to taking an iron supplement for the past few months.  Denies hematochezia, hematemesis, or hematuria.  ED Course: Afebrile, vital signs stable. WBC 1.9 (ANC 1.1), hemoglobin 7.5, hematocrit 23.6, platelet 34K. Sodium 132, potassium 4.3, chloride 101, bicarb 22, BUN 21, creatinine 1.4, glucose 154. No elevation of LFTs. UA not suggestive of infection. Urine culture pending. INR 1.1. Lactic acid 1.0. Blood culture x2 pending. SARS-CoV-2 PCR test and influenza panel both pending.  Chest x-ray negative for acute intrathoracic  process.  Patient was given vancomycin, Zosyn, and 1 L normal saline bolus.  He is post 2 unit PRBC transfusion on 12/11/2020.  2D echo has been completed no significant changes from prior.  Was seen by heme oncology and IR with plan for bone marrow biopsy/aspiration on Monday 12/14/2020.  12/12/20: Seen and examined with his wife at his bedtime.  No acute events overnight.  He has no new complaints.  Denies any overt bleeding.  Mild drop in his WBC 2.4, and platelet count 26.  Currently on broad-spectrum IV antibiotics.  Hemoglobin stable at 9.2.  Awaiting bone marrow biopsy on 12/14/2020 by interventional radiology.  Appreciate hematology's assistance.  Repeat CBC with differentials in the morning.  N.p.o. after midnight on 12/14/2020  Assessment/Plan: Principal Problem:   Pancytopenia ([place]) Active Problems:   Coronary artery disease involving native coronary artery of native heart without angina pectoris   Essential hypertension   S/P aortic valve replacement with bioprosthetic valve    AKI (acute kidney injury) (HCC)   Pancytopenia, leukopenia and thrombocytopenia worsening, Unclear etiology, pending bone marrow biopsy on 12/14/2020 by IR. Rule out active infective process Patient has a history of endocarditis A 2D echo has been completed no significant changes from prior. Seen by hematology with plan for bone marrow biopsy by IR on Monday, 12/14/2020. He has received 2 unit PRBCs, hemoglobin is currently on 9.3, continue to monitor H&H Transfuse hemoglobin less than 7.0 No evidence of overt bleeding Drop in platelet count to 26K from platelet count 29K Drop in WBC 2.4K from WBC 2.5K. Appreciate specialists assistance, hematology oncology, IR. CT chest, abdomen and pelvis has  been ordered by heme oncology, follow results.  Worsening AKI, likely prerenal in the setting of dehydration Normal baseline creatinine and GFR Creatinine downtrending, 1.38 from 1.44 with GFR 54. Start normal saline  at 30 cc/h x 1 day, monitor volume status. Encourage increase in oral free fluid intake  Upon exotoxins and monitor urine output. Repeat renal panel in the morning.  Low vitamin B12 level 154 Unclear if this has contributed to his pancytopenia Being repleted IM 1000 mcg x5 doses.  Aortic stenosis status post bioprosthetic AVR Had a TEE in May 21 and TTE in August 2021 with thickening of one of the leaflet of the bioprosthetic aortic valve.  He was treated for endocarditis in May 2021. He was seen at his cardiologist office on 12/09/2020 with plan for repeat echocardiogram.  A 2D echo has been completed on 12/11/2020.  Coronary artery disease status post PCI and CABG Denies any anginal symptoms at the time of this visit. Continue to hold off home aspirin due to thrombocytopenia. Continue Toprol-XL and Crestor.  OSA Continue CPAP at night  Essential hypertension BP is at goal Continue home Toprol-XL Hold off lisinopril due to AKI  Type 2 diabetes well controlled Hemoglobin A1c 5.6 on 12/11/2020 Hold off home oral hypoglycemics Insulin sliding scale as needed for hyperglycemia    Code Status: Full code  Family Communication: Wife at bedside, updated on 12/12/2020.  Disposition Plan: Likely will DC to home after his bone marrow biopsy, planned on Monday.   Consultants:  Heme oncology  Interventional radiology  Procedures:  2D echo  Antimicrobials:  Zosyn  IV vancomycin  DVT prophylaxis: SCDs.  Chemical DVT prophylaxis is currently contraindicated in the setting of severe thrombocytopenia.  Status is: Inpatient    Dispo: The patient is from: Home.              Anticipated d/c is to: Home.              Anticipated d/c date is: 12/16/2019              Patient currently not stable for discharge, ongoing management of pancytopenia        Objective: Vitals:   12/12/20 0606 12/12/20 0825 12/12/20 0831 12/12/20 1223  BP: (!) 147/73  134/73 121/68  Pulse:  68  63 (!) 59  Resp: 18   18  Temp: 97.8 F (36.6 C)   98 F (36.7 C)  TempSrc: Oral     SpO2: 99% 99% 99% 98%  Weight:      Height:        Intake/Output Summary (Last 24 hours) at 12/12/2020 1452 Last data filed at 12/12/2020 0201 Gross per 24 hour  Intake 740.21 ml  Output --  Net 740.21 ml   Filed Weights   12/10/20 1244 12/11/20 0055  Weight: 100.2 kg 100.2 kg    Exam:  . General: 75 y.o. year-old male well-developed well-nourished in no acute distress.  Alert and oriented x3. . Cardiovascular: Regular rate and rhythm with no rubs or gallops. [name] Respiratory: Clear to auscultation no wheezes or rales.. . Abdomen: Soft nontender normal bowel sounds present. . Musculoskeletal: No lower extremity edema bilaterally.   . Skin: No ulcerative lesions noted. [name] Psychiatry: Mood is appropriate for condition and setting.   Data Reviewed: CBC: Recent Labs  Lab 12/09/20 1629 12/10/20 1305 12/11/20 0444 12/12/20 0135  WBC 2.6* 1.9* 2.5*  2.6* 2.4*  NEUTROABS  --  1.1* 1.6* 1.4*  HGB 7.6* 7.5*  9.5*  9.4* 9.4*  HCT 21.8* 23.6* 27.4*  28.4* 29.6*  MCV 98* 106.8* 94.2  95.3 100.0  PLT 33* 34* 29*  28* 26*   Basic Metabolic Panel: Recent Labs  Lab 12/09/20 1629 12/10/20 1305 12/11/20 0444 12/12/20 0135  NA 131* 132* 136 135  K 4.3 4.3 4.2 4.1  CL 97 101 107 105  CO2 20 22 19* 20*  GLUCOSE 89 154* 102* 101*  BUN 24 21 18 16   CREATININE 1.44* 1.40* 1.38* 1.39*  CALCIUM 9.3 9.1 8.8* 9.1   GFR: Estimated Creatinine Clearance: 56.1 mL/min (A) (by C-G formula based on SCr of 1.39 mg/dL (H)). Liver Function Tests: Recent Labs  Lab 12/10/20 1305  AST 32  ALT 20  ALKPHOS 30*  BILITOT 0.9  PROT 7.5  ALBUMIN 4.4   No results for input(s): LIPASE, AMYLASE in the last 168 hours. No results for input(s): AMMONIA in the last 168 hours. Coagulation Profile: Recent Labs  Lab 12/10/20 1743  INR 1.1   Cardiac Enzymes: No results for input(s): CKTOTAL, CKMB,  CKMBINDEX, TROPONINI in the last 168 hours. BNP (last 3 results) Recent Labs    12/09/20 1629  PROBNP 415*   HbA1C: Recent Labs    12/11/20 0444  HGBA1C 5.6   CBG: Recent Labs  Lab 12/11/20 1123 12/11/20 1659 12/11/20 2102 12/12/20 0830 12/12/20 1222  GLUCAP 121* 251* 70 118* 107*   Lipid Profile: No results for input(s): CHOL, HDL, LDLCALC, TRIG, CHOLHDL, LDLDIRECT in the last 72 hours. Thyroid Function Tests: No results for input(s): TSH, T4TOTAL, FREET4, T3FREE, THYROIDAB in the last 72 hours. Anemia Panel: Recent Labs    12/11/20 0444  VITAMINB12 154*  FOLATE 32.4  RETICCTPCT 2.8   Urine analysis:    Component Value Date/Time   COLORURINE STRAW (A) 12/10/2020 1743   APPEARANCEUR CLEAR 12/10/2020 1743   LABSPEC 1.005 12/10/2020 1743   PHURINE 6.0 12/10/2020 1743   GLUCOSEU NEGATIVE 12/10/2020 1743   HGBUR NEGATIVE 12/10/2020 [place] 12/10/2020 1743   KETONESUR NEGATIVE 12/10/2020 1743   PROTEINUR NEGATIVE 12/10/2020 1743   NITRITE NEGATIVE 12/10/2020 1743   LEUKOCYTESUR NEGATIVE 12/10/2020 1743   Sepsis Labs: @LABRCNTIP (procalcitonin:4,lacticidven:4)  ) Recent Results (from the past 240 hour(s))  Blood culture (routine single)     Status: None (Preliminary result)   Collection Time: 12/10/20  5:43 PM   Specimen: BLOOD  Result Value Ref Range Status   Specimen Description BLOOD SITE NOT SPECIFIED  Final   Special Requests   Final    BOTTLES DRAWN AEROBIC AND ANAEROBIC Blood Culture results may not be optimal due to an excessive volume of blood received in culture bottles   Culture   Final    NO GROWTH 2 DAYS Performed at [facility], [place] [address] [place], [place] [address]    Report Status PENDING  Incomplete  Resp Panel by RT-PCR (Flu A&B, Covid) Nasopharyngeal Swab     Status: None   Collection Time: 12/10/20  5:46 PM   Specimen: Nasopharyngeal Swab; Nasopharyngeal(NP) swabs in vial transport medium  Result Value  Ref Range Status   SARS Coronavirus 2 by RT PCR NEGATIVE NEGATIVE Final    Comment: (NOTE) SARS-CoV-2 target nucleic acids are NOT DETECTED.  The SARS-CoV-2 RNA is generally detectable in upper respiratory specimens during the acute phase of infection. The lowest concentration of SARS-CoV-2 viral copies this assay can detect is 138 copies/mL. A negative result does not preclude SARS-Cov-2 infection and  should not be used as the sole basis for treatment or other patient management decisions. A negative result may occur with  improper specimen collection/handling, submission of specimen other than nasopharyngeal swab, presence of viral mutation(s) within the areas targeted by this assay, and inadequate number of viral copies(<138 copies/mL). A negative result must be combined with clinical observations, patient history, and epidemiological information. The expected result is Negative.  Fact Sheet for Patients:  EntrepreneurPulse.com.au  Fact Sheet for Healthcare Providers:  IncredibleEmployment.be  This test is no t yet approved or cleared by the [place] FDA and  has been authorized for detection and/or diagnosis of SARS-CoV-2 by FDA under an Emergency Use Authorization (EUA). This EUA will remain  in effect (meaning this test can be used) for the duration of the COVID-19 declaration under Section 564(b)(1) of the Act, 21 U.S.C.section 360bbb-3(b)(1), unless the authorization is terminated  or revoked sooner.       Influenza A by PCR NEGATIVE NEGATIVE Final   Influenza B by PCR NEGATIVE NEGATIVE Final    Comment: (NOTE) The Xpert Xpress SARS-CoV-2/FLU/RSV plus assay is intended as an aid in the diagnosis of influenza from Nasopharyngeal swab specimens and should not be used as a sole basis for treatment. Nasal washings and aspirates are unacceptable for Xpert Xpress SARS-CoV-2/FLU/RSV testing.  Fact Sheet for  Patients: EntrepreneurPulse.com.au  Fact Sheet for Healthcare Providers: IncredibleEmployment.be  This test is not yet approved or cleared by the [place] FDA and has been authorized for detection and/or diagnosis of SARS-CoV-2 by FDA under an Emergency Use Authorization (EUA). This EUA will remain in effect (meaning this test can be used) for the duration of the COVID-19 declaration under Section 564(b)(1) of the Act, 21 U.S.C. section 360bbb-3(b)(1), unless the authorization is terminated or revoked.  Performed at [facility], [place] [address] [place], [place] [address]   Culture, blood (single)     Status: None (Preliminary result)   Collection Time: 12/10/20  8:00 PM   Specimen: BLOOD  Result Value Ref Range Status   Specimen Description BLOOD RIGHT ANTECUBITAL  Final   Special Requests   Final    BOTTLES DRAWN AEROBIC AND ANAEROBIC Blood Culture adequate volume   Culture   Final    NO GROWTH 2 DAYS Performed at [facility], [place] [address] [place], [place] [address]    Report Status PENDING  Incomplete      Studies: No results found.  Scheduled Meds: . cyanocobalamin  1,000 mcg Intramuscular QPC breakfast  . ferrous sulfate  325 mg Oral Q breakfast  . fluticasone  2 spray Each Nare q AM  . gabapentin  600 mg Oral QHS  . insulin aspart  0-5 Units Subcutaneous QHS  . insulin aspart  0-9 Units Subcutaneous TID WC  . loratadine  10 mg Oral Daily  . metoprolol succinate  50 mg Oral QHS  . pantoprazole  40 mg Oral q1800  . rosuvastatin  10 mg Oral QHS    Continuous Infusions: . piperacillin-tazobactam (ZOSYN)  IV 3.375 g (12/12/20 1118)  . vancomycin 750 mg (12/12/20 0907)     LOS: 2 days     Kayleen Memos, MD [facility] Pager [phone]  If 7PM-7AM, please contact night-coverage [email or web address] Password [email or web address] 12/12/2020, 2:52 PM

## 2020-12-12 NOTE — Progress Notes (Signed)
Pt placed self on cpap for the night. 

## 2020-12-13 LAB — CBC WITH DIFFERENTIAL/PLATELET
Abs Immature Granulocytes: 0.02 10*3/uL (ref 0.00–0.07)
Basophils Absolute: 0 10*3/uL (ref 0.0–0.1)
Basophils Relative: 1 %
Eosinophils Absolute: 0 10*3/uL (ref 0.0–0.5)
Eosinophils Relative: 1 %
HCT: 28 % — ABNORMAL LOW (ref 39.0–52.0)
Hemoglobin: 9.4 g/dL — ABNORMAL LOW (ref 13.0–17.0)
Immature Granulocytes: 1 %
Lymphocytes Relative: 34 %
Lymphs Abs: 0.8 10*3/uL (ref 0.7–4.0)
MCH: 32.3 pg (ref 26.0–34.0)
MCHC: 33.6 g/dL (ref 30.0–36.0)
MCV: 96.2 fL (ref 80.0–100.0)
Monocytes Absolute: 0.3 10*3/uL (ref 0.1–1.0)
Monocytes Relative: 13 %
Neutro Abs: 1.1 10*3/uL — ABNORMAL LOW (ref 1.7–7.7)
Neutrophils Relative %: 50 %
Platelets: 24 10*3/uL — CL (ref 150–400)
RBC: 2.91 MIL/uL — ABNORMAL LOW (ref 4.22–5.81)
RDW: 18.8 % — ABNORMAL HIGH (ref 11.5–15.5)
WBC: 2.2 10*3/uL — ABNORMAL LOW (ref 4.0–10.5)
nRBC: 0.9 % — ABNORMAL HIGH (ref 0.0–0.2)

## 2020-12-13 LAB — BASIC METABOLIC PANEL
Anion gap: 10 (ref 5–15)
BUN: 13 mg/dL (ref 8–23)
CO2: 21 mmol/L — ABNORMAL LOW (ref 22–32)
Calcium: 9 mg/dL (ref 8.9–10.3)
Chloride: 105 mmol/L (ref 98–111)
Creatinine, Ser: 1.62 mg/dL — ABNORMAL HIGH (ref 0.61–1.24)
GFR, Estimated: 45 mL/min — ABNORMAL LOW (ref >60)
Glucose, Bld: 111 mg/dL — ABNORMAL HIGH (ref 70–99)
Potassium: 4 mmol/L (ref 3.5–5.1)
Sodium: 136 mmol/L (ref 135–145)

## 2020-12-13 LAB — VANCOMYCIN, TROUGH: Vancomycin Tr: 14 ug/mL — ABNORMAL LOW (ref 15–20)

## 2020-12-13 LAB — GLUCOSE, CAPILLARY
Glucose-Capillary: 114 mg/dL — ABNORMAL HIGH (ref 70–99)
Glucose-Capillary: 112 mg/dL — ABNORMAL HIGH (ref 70–99)
Glucose-Capillary: 107 mg/dL — ABNORMAL HIGH (ref 70–99)
Glucose-Capillary: 118 mg/dL — ABNORMAL HIGH (ref 70–99)

## 2020-12-13 MED ORDER — SODIUM CHLORIDE 0.9 % IV SOLN
2.0000 g | Freq: Two times a day (BID) | INTRAVENOUS | Status: DC
  Administered 2020-12-13 – 2020-12-15 (×4): 2 g via INTRAVENOUS
  Filled 2020-12-13 (×5): qty 2

## 2020-12-13 MED ORDER — CHLORHEXIDINE GLUCONATE CLOTH 2 % EX PADS
6.0000 | MEDICATED_PAD | Freq: Every day | CUTANEOUS | Status: DC
  Administered 2020-12-14: 6 via TOPICAL

## 2020-12-13 MED ORDER — SODIUM CHLORIDE 0.9 % IV SOLN: INTRAVENOUS | Status: AC

## 2020-12-13 NOTE — Plan of Care (Signed)
  Problem: Education: Goal: Knowledge of General Education information will improve Description Including pain rating scale, medication(s)/side effects and non-pharmacologic comfort measures Outcome: Progressing   

## 2020-12-13 NOTE — Progress Notes (Signed)
Pharmacy Antibiotic Note  Theodore Jenkins is a 75 y.o. male admitted on 12/10/2020 with sepsis.  Pharmacy has been consulted for cefepime dosing. Patient WBC is down to 2.2 and Scr is 1.62. Patient is afebrile. Patient has a hx of endocarditis and is awaiting bone marrow biopsy. Will narrow antibiotics from vancomycin and zosyn to cefepime and monitor culture reports.   Plan: Cefepime 2G Q12H  Monitor renal fx and clinical status  F/u duration of antibiotic therapy   Height: 5' 9"  (175.3 cm) Weight: 100.2 kg (220 lb 14.4 oz) IBW/kg (Calculated) : 70.7  Temp (24hrs), Avg:98.2 F (36.8 C), Min:98 F (36.7 C), Max:98.4 F (36.9 C)  Recent Labs  Lab 12/09/20 1629 12/10/20 1305 12/10/20 1743 12/11/20 0444 12/12/20 0135 12/13/20 0213 12/13/20 0901  WBC 2.6* 1.9*  --  2.5*  2.6* 2.4* 2.2*  --   CREATININE 1.44* 1.40*  --  1.38* 1.39* 1.62*  --   LATICACIDVEN  --   --  1.0  --   --   --   --   VANCOTROUGH  --   --   --   --   --   --  14*    Estimated Creatinine Clearance: 48.1 mL/min (A) (by C-G formula based on SCr of 1.62 mg/dL (H)).    Allergies  Allergen Reactions  . Oxycodone Nausea And Vomiting    Antimicrobials this admission: Cefepime 1/2> Vanc 12/30 >>1/2 Zosyn 12/30 >>1/2 Dose adjustments this admission: NA  Microbiology results: 12/30 BCx - NGTD 12/30  UCx -    Thank you for allowing pharmacy to be a part of this patient's care.  Cephus Slater, PharmD, [facility] Resident [phone] 12/13/2020 11:34 AM

## 2020-12-13 NOTE — Plan of Care (Signed)
  Problem: Education: Goal: Knowledge of General Education information will improve Description: Including pain rating scale, medication(s)/side effects and non-pharmacologic comfort measures Outcome: Progressing   Problem: Clinical Measurements: Goal: Respiratory complications will improve Outcome: Progressing   Problem: Clinical Measurements: Goal: Cardiovascular complication will be avoided Outcome: Progressing   Problem: Activity: Goal: Risk for activity intolerance will decrease Outcome: Progressing   Problem: Nutrition: Goal: Adequate nutrition will be maintained Outcome: Progressing   Problem: Coping: Goal: Level of anxiety will decrease Outcome: Progressing   Problem: Safety: Goal: Ability to remain free from injury will improve Outcome: Progressing

## 2020-12-13 NOTE — Progress Notes (Signed)
Pt able to place himself on CPAP dream station for the night without assistance. Pt on home setting of 8 cmH2O w/no oxygen bled into the system. RT will continue to monitor.

## 2020-12-13 NOTE — Progress Notes (Signed)
PROGRESS NOTE  Theodore Jenkins IHK:[id number] DOB: Mar 03, 1946 DOA: 12/10/2020 PCP: Karlene Einstein, MD  HPI/Recap of past 24 hours:  Theodore Jenkins is a 75 y.o. male with medical history significant of CAD status post PCI in 2005 and CABG in 2018, severe aortic stenosis status post bioprosthetic AVR in 2018, endocarditis in May 2021, CVA status post right CEA with subsequent right internal carotid artery occlusion, GERD, Barrett's esophagus, AAA, hypertension, hyperlipidemia, non-insulin-dependent type II diabetes, OSA on CPAP presenting to the ED for evaluation of abnormal labs. Patient went to see cardiology yesterday and labs came back showing pancytopenia (WBC 2.6, hemoglobin 7.6, platelet count 33K).  Patient reports 1 month history of progressively worsening dyspnea on exertion and fatigue.  He was previously able to walk on the treadmill for 20 minutes every day without resting and now his exercise capacity is reduced to only 5 minutes at a time.  Reports history of epistaxis a few weeks ago which resolved with no recurrence.  He has noticed purpura on his arms but no oral mucosal bleeding.  Reports having chronic dark stools which he attributes to taking an iron supplement for the past few months.  Denies hematochezia, hematemesis, or hematuria.  ED Course: Afebrile, vital signs stable. WBC 1.9 (ANC 1.1), hemoglobin 7.5, hematocrit 23.6, platelet 34K. Sodium 132, potassium 4.3, chloride 101, bicarb 22, BUN 21, creatinine 1.4, glucose 154. No elevation of LFTs. UA not suggestive of infection. INR 1.1. Lactic acid 1.0. Blood culture x2 no growth to date. SARS-CoV-2 PCR test and influenza panel both negative.  Chest x-ray negative for acute intrathoracic process.  Patient was started on admission with broad-spectrum IV antibiotics vancomycin, Zosyn.  Switched to cefepime on 12/13/2020.  He is post 2 unit PRBC transfusion on 12/11/2020.  2D echo has been completed no significant changes from prior.   Was seen by heme oncology and IR with plan for bone marrow biopsy/aspiration on Monday 12/14/2020.  12/13/20: Patient was seen and examined at his bedside.  No acute events overnight.  He denies any overt bleeding.  No mucosal bleed.  Denies hematochezia.  Admits to dark stools which he contributes to iron supplement.  Patient will be n.p.o. after midnight due to anticipated bone marrow biopsy by interventional radiology on 12/14/2020.  Assessment/Plan: Principal Problem:   Pancytopenia ([place]) Active Problems:   Coronary artery disease involving native coronary artery of native heart without angina pectoris   Essential hypertension   S/P aortic valve replacement with bioprosthetic valve    AKI (acute kidney injury) (HCC)   Pancytopenia, leukopenia and thrombocytopenia worsening, Unclear etiology, pending bone marrow biopsy on 12/14/2020 by IR. No evidence of active infective process, he is currently on broad-spectrum IV antibiotics. Initially he was on IV vancomycin and Zosyn, switched to cefepime on 12/13/2020. Patient has a history of endocarditis A 2D echo has been completed no significant changes from prior. Seen by hematology with plan for bone marrow biopsy by IR on Monday, 12/14/2020. He has received 2 unit PRBCs, hemoglobin is currently stable at 9.4.  Transfuse hemoglobin less than 7.0 No evidence of overt bleeding Mild drop in platelet count and WBC.  Continue to monitor.   Appreciate specialists assistance, hematology oncology, IR. CT chest, abdomen and pelvis ordered by heme oncology.  Worsening AKI, likely multifactorial, prerenal secondary to dehydration and likely ATN, on IV vancomycin and Zosyn which were switched to cefepime on 12/13/2020. He has a normal baseline creatinine and GFR His creatinine is uptrending 1.62.  Continue gentle IV fluid hydration normal saline at 30 cc/h x 2 days. Monitor urine output Avoid nephrotoxins, dehydration and hypotension Repeat renal panel in the  morning.  Low vitamin B12 level 154 Unclear if this has contributed to his pancytopenia Being repleted IM 1000 mcg x5 doses.  Aortic stenosis status post bioprosthetic AVR Had a TEE in May 21 and TTE in August 2021 with thickening of one of the leaflet of the bioprosthetic aortic valve.  He was treated for endocarditis in May 2021. He was seen at his cardiologist office on 12/09/2020 with plan for repeat echocardiogram.  A 2D echo has been completed on 12/11/2020.  Coronary artery disease status post PCI and CABG He denies any anginal symptoms. Continue to hold off home aspirin due to thrombocytopenia. Continue Toprol-XL and Crestor.  OSA Continue CPAP at night  Essential hypertension BP is currently at goal. Continue home Toprol-XL Hold off lisinopril due to AKI  Type 2 diabetes well controlled. Hemoglobin A1c 5.6 on 12/11/2020 Hold off home oral hypoglycemics Continue insulin sliding scale as needed for hyperglycemia  Physical debility PT to assess Fall precautions    Code Status: Full code  Family Communication: Wife at bedside, updated on 12/13/2020.  Disposition Plan: Likely will DC to home after his bone marrow biopsy, planned on Monday.   Consultants:  Heme oncology  Interventional radiology  Procedures:  2D echo  Antimicrobials:  Zosyn  IV vancomycin  DVT prophylaxis: SCDs.  Chemical DVT prophylaxis is currently contraindicated in the setting of severe thrombocytopenia.  Status is: Inpatient    Dispo: The patient is from: Home.              Anticipated d/c is to: Home.              Anticipated d/c date is: 12/16/2019              Patient currently not stable for discharge, ongoing management of pancytopenia        Objective: Vitals:   12/12/20 1223 12/12/20 2117 12/13/20 0533 12/13/20 1324  BP: 121/68 130/61 120/64 130/89  Pulse: (!) 59 65 62 66  Resp: 18 20 14 18   Temp: 98 F (36.7 C) 98.4 F (36.9 C) 98.1 F (36.7 C) 97.9 F  (36.6 C)  TempSrc:  Oral Oral   SpO2: 98% 98% 99% 100%  Weight:      Height:        Intake/Output Summary (Last 24 hours) at 12/13/2020 1439 Last data filed at 12/13/2020 0518 Gross per 24 hour  Intake 989.14 ml  Output --  Net 989.14 ml   Filed Weights   12/10/20 1244 12/11/20 0055  Weight: 100.2 kg 100.2 kg    Exam:  . General: 75 y.o. year-old male pleasant well-developed well-nourished in no acute stress.  Alert and oriented x3. . Cardiovascular: Regular rate and rhythm no rubs or gallops.  [name] Respiratory: Clear to auscultation no wheezes or rales . Abdomen: Soft nontender no bowel sounds present. . Musculoskeletal: No lower extremity edema bilaterally.   . Skin: No ulcerative lesions noted. [name] Psychiatry: Mood is appropriate for condition and setting.   Data Reviewed: CBC: Recent Labs  Lab 12/09/20 1629 12/10/20 1305 12/11/20 0444 12/12/20 0135 12/13/20 0213  WBC 2.6* 1.9* 2.5*  2.6* 2.4* 2.2*  NEUTROABS  --  1.1* 1.6* 1.4* 1.1*  HGB 7.6* 7.5* 9.5*  9.4* 9.4* 9.4*  HCT 21.8* 23.6* 27.4*  28.4* 29.6* 28.0*  MCV 98* 106.8* 94.2  95.3 100.0 96.2  PLT 33* 34* 29*  28* 26* 24*   Basic Metabolic Panel: Recent Labs  Lab 12/09/20 1629 12/10/20 1305 12/11/20 0444 12/12/20 0135 12/13/20 0213  NA 131* 132* 136 135 136  K 4.3 4.3 4.2 4.1 4.0  CL 97 101 107 105 105  CO2 20 22 19* 20* 21*  GLUCOSE 89 154* 102* 101* 111*  BUN 24 21 18 16 13   CREATININE 1.44* 1.40* 1.38* 1.39* 1.62*  CALCIUM 9.3 9.1 8.8* 9.1 9.0   GFR: Estimated Creatinine Clearance: 48.1 mL/min (A) (by C-G formula based on SCr of 1.62 mg/dL (H)). Liver Function Tests: Recent Labs  Lab 12/10/20 1305  AST 32  ALT 20  ALKPHOS 30*  BILITOT 0.9  PROT 7.5  ALBUMIN 4.4   No results for input(s): LIPASE, AMYLASE in the last 168 hours. No results for input(s): AMMONIA in the last 168 hours. Coagulation Profile: Recent Labs  Lab 12/10/20 1743  INR 1.1   Cardiac Enzymes: No results for  input(s): CKTOTAL, CKMB, CKMBINDEX, TROPONINI in the last 168 hours. BNP (last 3 results) Recent Labs    12/09/20 1629  PROBNP 415*   HbA1C: Recent Labs    12/11/20 0444  HGBA1C 5.6   CBG: Recent Labs  Lab 12/12/20 1222 12/12/20 1710 12/12/20 2116 12/13/20 0812 12/13/20 1218  GLUCAP 107* 86 156* 114* 112*   Lipid Profile: No results for input(s): CHOL, HDL, LDLCALC, TRIG, CHOLHDL, LDLDIRECT in the last 72 hours. Thyroid Function Tests: No results for input(s): TSH, T4TOTAL, FREET4, T3FREE, THYROIDAB in the last 72 hours. Anemia Panel: Recent Labs    12/11/20 0444  VITAMINB12 154*  FOLATE 32.4  RETICCTPCT 2.8   Urine analysis:    Component Value Date/Time   COLORURINE STRAW (A) 12/10/2020 1743   APPEARANCEUR CLEAR 12/10/2020 1743   LABSPEC 1.005 12/10/2020 1743   PHURINE 6.0 12/10/2020 1743   GLUCOSEU NEGATIVE 12/10/2020 1743   HGBUR NEGATIVE 12/10/2020 [place] 12/10/2020 1743   KETONESUR NEGATIVE 12/10/2020 1743   PROTEINUR NEGATIVE 12/10/2020 1743   NITRITE NEGATIVE 12/10/2020 1743   LEUKOCYTESUR NEGATIVE 12/10/2020 1743   Sepsis Labs: @LABRCNTIP (procalcitonin:4,lacticidven:4)  ) Recent Results (from the past 240 hour(s))  Blood culture (routine single)     Status: None (Preliminary result)   Collection Time: 12/10/20  5:43 PM   Specimen: BLOOD  Result Value Ref Range Status   Specimen Description BLOOD SITE NOT SPECIFIED  Final   Special Requests   Final    BOTTLES DRAWN AEROBIC AND ANAEROBIC Blood Culture results may not be optimal due to an excessive volume of blood received in culture bottles   Culture   Final    NO GROWTH 3 DAYS Performed at [facility], [place] [address] [place], [place] [address]    Report Status PENDING  Incomplete  Resp Panel by RT-PCR (Flu A&B, Covid) Nasopharyngeal Swab     Status: None   Collection Time: 12/10/20  5:46 PM   Specimen: Nasopharyngeal Swab; Nasopharyngeal(NP) swabs in vial  transport medium  Result Value Ref Range Status   SARS Coronavirus 2 by RT PCR NEGATIVE NEGATIVE Final    Comment: (NOTE) SARS-CoV-2 target nucleic acids are NOT DETECTED.  The SARS-CoV-2 RNA is generally detectable in upper respiratory specimens during the acute phase of infection. The lowest concentration of SARS-CoV-2 viral copies this assay can detect is 138 copies/mL. A negative result does not preclude SARS-Cov-2 infection and should not be used as the  sole basis for treatment or other patient management decisions. A negative result may occur with  improper specimen collection/handling, submission of specimen other than nasopharyngeal swab, presence of viral mutation(s) within the areas targeted by this assay, and inadequate number of viral copies(<138 copies/mL). A negative result must be combined with clinical observations, patient history, and epidemiological information. The expected result is Negative.  Fact Sheet for Patients:  EntrepreneurPulse.com.au  Fact Sheet for Healthcare Providers:  IncredibleEmployment.be  This test is no t yet approved or cleared by the [place] FDA and  has been authorized for detection and/or diagnosis of SARS-CoV-2 by FDA under an Emergency Use Authorization (EUA). This EUA will remain  in effect (meaning this test can be used) for the duration of the COVID-19 declaration under Section 564(b)(1) of the Act, 21 U.S.C.section 360bbb-3(b)(1), unless the authorization is terminated  or revoked sooner.       Influenza A by PCR NEGATIVE NEGATIVE Final   Influenza B by PCR NEGATIVE NEGATIVE Final    Comment: (NOTE) The Xpert Xpress SARS-CoV-2/FLU/RSV plus assay is intended as an aid in the diagnosis of influenza from Nasopharyngeal swab specimens and should not be used as a sole basis for treatment. Nasal washings and aspirates are unacceptable for Xpert Xpress SARS-CoV-2/FLU/RSV testing.  Fact  Sheet for Patients: EntrepreneurPulse.com.au  Fact Sheet for Healthcare Providers: IncredibleEmployment.be  This test is not yet approved or cleared by the [place] FDA and has been authorized for detection and/or diagnosis of SARS-CoV-2 by FDA under an Emergency Use Authorization (EUA). This EUA will remain in effect (meaning this test can be used) for the duration of the COVID-19 declaration under Section 564(b)(1) of the Act, 21 U.S.C. section 360bbb-3(b)(1), unless the authorization is terminated or revoked.  Performed at [facility], [place] [address], [place], [place] [address]   Culture, blood (single)     Status: None (Preliminary result)   Collection Time: 12/10/20  8:00 PM   Specimen: BLOOD  Result Value Ref Range Status   Specimen Description BLOOD RIGHT ANTECUBITAL  Final   Special Requests   Final    BOTTLES DRAWN AEROBIC AND ANAEROBIC Blood Culture adequate volume   Culture   Final    NO GROWTH 3 DAYS Performed at [facility], [place] [address], [place], [place] [address]    Report Status PENDING  Incomplete      Studies: No results found.  Scheduled Meds: . cyanocobalamin  1,000 mcg Intramuscular QPC breakfast  . ferrous sulfate  325 mg Oral Q breakfast  . fluticasone  2 spray Each Nare q AM  . gabapentin  600 mg Oral QHS  . insulin aspart  0-5 Units Subcutaneous QHS  . insulin aspart  0-9 Units Subcutaneous TID WC  . loratadine  10 mg Oral Daily  . metoprolol succinate  50 mg Oral QHS  . pantoprazole  40 mg Oral q1800  . rosuvastatin  10 mg Oral QHS    Continuous Infusions: . sodium chloride 50 mL/hr at 12/13/20 0518  . ceFEPime (MAXIPIME) IV       LOS: 3 days     Kayleen Memos, MD [facility] Pager [phone]  If 7PM-7AM, please contact night-coverage [email or web address] Password [facility] 12/13/2020, 2:39 PM

## 2020-12-14 DIAGNOSIS — D649 Anemia, unspecified: Secondary | ICD-10-CM | POA: Diagnosis not present

## 2020-12-14 DIAGNOSIS — D61818 Other pancytopenia: Secondary | ICD-10-CM | POA: Diagnosis not present

## 2020-12-14 LAB — BASIC METABOLIC PANEL
Anion gap: 8 (ref 5–15)
BUN: 15 mg/dL (ref 8–23)
CO2: 21 mmol/L — ABNORMAL LOW (ref 22–32)
Calcium: 8.8 mg/dL — ABNORMAL LOW (ref 8.9–10.3)
Chloride: 107 mmol/L (ref 98–111)
Creatinine, Ser: 1.44 mg/dL — ABNORMAL HIGH (ref 0.61–1.24)
GFR, Estimated: 52 mL/min — ABNORMAL LOW (ref >60)
Glucose, Bld: 122 mg/dL — ABNORMAL HIGH (ref 70–99)
Potassium: 3.9 mmol/L (ref 3.5–5.1)
Sodium: 136 mmol/L (ref 135–145)

## 2020-12-14 LAB — CBC WITH DIFFERENTIAL/PLATELET
Abs Immature Granulocytes: 0.01 10*3/uL (ref 0.00–0.07)
Basophils Absolute: 0 10*3/uL (ref 0.0–0.1)
Basophils Relative: 0 %
Eosinophils Absolute: 0 10*3/uL (ref 0.0–0.5)
Eosinophils Relative: 0 %
HCT: 26.9 % — ABNORMAL LOW (ref 39.0–52.0)
Hemoglobin: 9.2 g/dL — ABNORMAL LOW (ref 13.0–17.0)
Immature Granulocytes: 0 %
Lymphocytes Relative: 27 %
Lymphs Abs: 0.7 10*3/uL (ref 0.7–4.0)
MCH: 32.6 pg (ref 26.0–34.0)
MCHC: 34.2 g/dL (ref 30.0–36.0)
MCV: 95.4 fL (ref 80.0–100.0)
Monocytes Absolute: 0.3 10*3/uL (ref 0.1–1.0)
Monocytes Relative: 13 %
Neutro Abs: 1.5 10*3/uL — ABNORMAL LOW (ref 1.7–7.7)
Neutrophils Relative %: 60 %
Platelets: 23 10*3/uL — CL (ref 150–400)
RBC: 2.82 MIL/uL — ABNORMAL LOW (ref 4.22–5.81)
RDW: 18.3 % — ABNORMAL HIGH (ref 11.5–15.5)
WBC: 2.5 10*3/uL — ABNORMAL LOW (ref 4.0–10.5)
nRBC: 0 % (ref 0.0–0.2)

## 2020-12-14 LAB — GLUCOSE, CAPILLARY
Glucose-Capillary: 112 mg/dL — ABNORMAL HIGH (ref 70–99)
Glucose-Capillary: 219 mg/dL — ABNORMAL HIGH (ref 70–99)
Glucose-Capillary: 88 mg/dL (ref 70–99)
Glucose-Capillary: 128 mg/dL — ABNORMAL HIGH (ref 70–99)

## 2020-12-14 NOTE — Progress Notes (Signed)
IR unable to accommodate bone marrow biopsy today due to lab collection timing requirements - will plan for approximately 0830 tomorrow (1/4). Diet restarted today, patient to be NPO at midnight. Floor RN aware.  Please call IR with questions or concerns.  Candiss Norse, PA-C

## 2020-12-14 NOTE — Progress Notes (Signed)
PROGRESS NOTE  [name] HGD:[id number] DOB: 22-Sep-1946 DOA: 12/10/2020 PCP: Karlene Einstein, MD  HPI/Recap of past 24 hours:  Theodore Jenkins is a 75 y.o. male with medical history significant of CAD status post PCI in 2005 and CABG in 2018, severe aortic stenosis status post bioprosthetic AVR in 2018, endocarditis in May 2021, CVA status post right CEA with subsequent right internal carotid artery occlusion, GERD, Barrett's esophagus, AAA, hypertension, hyperlipidemia, non-insulin-dependent type II diabetes, OSA on CPAP presenting to the ED for evaluation of abnormal labs. Patient went to see cardiology yesterday and labs came back showing pancytopenia (WBC 2.6, hemoglobin 7.6, platelet count 33K).  Patient reports 1 month history of progressively worsening dyspnea on exertion and fatigue.  He was previously able to walk on the treadmill for 20 minutes every day without resting and now his exercise capacity is reduced to only 5 minutes at a time.  Reports history of epistaxis a few weeks ago which resolved with no recurrence.  He has noticed purpura on his arms but no oral mucosal bleeding.  Reports having chronic dark stools which he attributes to taking an iron supplement for the past few months.  Denies hematochezia, hematemesis, or hematuria.  ED Course: Afebrile, vital signs stable. WBC 1.9 (ANC 1.1), hemoglobin 7.5, hematocrit 23.6, platelet 34K. Sodium 132, potassium 4.3, chloride 101, bicarb 22, BUN 21, creatinine 1.4, glucose 154. No elevation of LFTs. UA not suggestive of infection. INR 1.1. Lactic acid 1.0. Blood culture x2 no growth to date. SARS-CoV-2 PCR test and influenza panel both negative.  Chest x-ray negative for acute intrathoracic process.  Patient was started on admission with broad-spectrum IV antibiotics vancomycin, Zosyn.  Switched to cefepime on 12/13/2020.  He is post 2 unit PRBC transfusion on 12/11/2020.  2D echo has been completed no significant changes from prior.   Was seen by heme oncology and IR with plan for bone marrow biopsy/aspiration on Tuesday morning 12/15/2020.  Patient will be n.p.o. after midnight due to anticipated bone marrow biopsy by interventional radiology on 12/15/2020.   12/14/20: Patient was seen and examined with his significant other at his bedside.  There were no acute events overnight.  He has no new complaints.  He denies any mucosal bleed.  Denies hematochezia.  States stools are dark due to iron supplement use.  Hemoglobin has been stable.   Assessment/Plan: Principal Problem:   Pancytopenia ([place]) Active Problems:   Coronary artery disease involving native coronary artery of native heart without angina pectoris   Essential hypertension   S/P aortic valve replacement with bioprosthetic valve    AKI (acute kidney injury) (HCC)   Pancytopenia, leukopenia and thrombocytopenia worsening, Unclear etiology, pending bone marrow biopsy on 12/14/2020 by IR. No evidence of active infective process, he is currently on broad-spectrum IV antibiotics. Initially he was on IV vancomycin and Zosyn, switched to cefepime on 12/13/2020. Patient has a history of endocarditis A 2D echo has been completed no significant changes from prior. Seen by hematology with plan for bone marrow biopsy by IR on Monday, 12/14/2020. He has received 2 unit PRBCs, hemoglobin is currently stable at 9.4.  Transfuse hemoglobin less than 7.0 No evidence of overt bleeding Mild drop in platelet count and WBC.  Continue to monitor. Appreciate specialists assistance, hematology oncology, IR. CT chest, abdomen and pelvis ordered by heme oncology, shows no explanation for the pancytopenia.  Improving AKI, likely multifactorial, prerenal secondary to dehydration and likely ATN, on IV vancomycin and Zosyn which  were switched to cefepime on 12/13/2020. He has a normal baseline creatinine and GFR His creatinine peaked at 1.62 Creatinine downtrending He is currently on gentle IV  fluid hydration normal saline at 30 cc/h. Continue to avoid nephrotoxins, dehydration and hypotension. Monitor urine output Repeat renal panel in the morning.  Low vitamin B12 level 154 Being repleted IM 1000 mcg x5 doses.  Aortic stenosis status post bioprosthetic AVR Had a TEE in May 21 and TTE in August 2021 with thickening of one of the leaflet of the bioprosthetic aortic valve.  He was treated for endocarditis in May 2021. He was seen at his cardiologist office on 12/09/2020 with plan for repeat echocardiogram.  A 2D echo has been completed on 12/11/2020.  Coronary artery disease status post PCI and CABG He denies any anginal symptoms at the time of this evaluation. Continue to hold off home aspirin due to thrombocytopenia. Continue Toprol-XL and Crestor.  OSA Continue CPAP at night  Essential hypertension BP is currently at goal. Continue home Toprol-XL Hold off lisinopril due to AKI  Type 2 diabetes well controlled. Hemoglobin A1c 5.6 on 12/11/2020 Hold off home oral hypoglycemics Continue insulin sliding scale as needed for hyperglycemia  Physical debility PT assessment had no further recommendations. Continue fall precautions    Code Status: Full code  Family Communication: Wife at bedside, updated on 12/14/2020.  Disposition Plan: Likely will DC to home after his bone marrow biopsy, planned on Tuesday.   Consultants:  Heme oncology  Interventional radiology  Procedures:  2D echo  Antimicrobials:  Zosyn  IV vancomycin  DVT prophylaxis: SCDs.  Chemical DVT prophylaxis is currently contraindicated in the setting of severe thrombocytopenia.  Status is: Inpatient    Dispo: The patient is from: Home.              Anticipated d/c is to: Home.              Anticipated d/c date is: 12/16/2019              Patient currently not stable for discharge, ongoing management of pancytopenia        Objective: Vitals:   12/13/20 2053 12/13/20 2340  12/14/20 0442 12/14/20 0940  BP: (!) 149/71  (!) 128/53   Pulse: 78  70   Resp: 16     Temp: 98.8 F (37.1 C)  98.4 F (36.9 C)   TempSrc: Oral  Oral   SpO2: 98% 97% 99% 97%  Weight:      Height:       No intake or output data in the 24 hours ending 12/14/20 1335 Filed Weights   12/10/20 1244 12/11/20 0055  Weight: 100.2 kg 100.2 kg    Exam:  . General: 75 y.o. year-old male pleasant well-developed well-nourished no acute stress.  Alert and oriented x3. . Cardiovascular: Regular rate and rhythm no rubs or gallops.  [name] Respiratory: Clear to auscultation no wheeze or rales.   . Abdomen: Some of the normal bowel sounds present. . Musculoskeletal: No lower extremity edema bilaterally.   . Skin: No ulcerative lesions noted.   [name] Psychiatry: Mood is appropriate for condition and setting.  Data Reviewed: CBC: Recent Labs  Lab 12/10/20 1305 12/11/20 0444 12/12/20 0135 12/13/20 0213 12/14/20 0053  WBC 1.9* 2.5*  2.6* 2.4* 2.2* 2.5*  NEUTROABS 1.1* 1.6* 1.4* 1.1* 1.5*  HGB 7.5* 9.5*  9.4* 9.4* 9.4* 9.2*  HCT 23.6* 27.4*  28.4* 29.6* 28.0* 26.9*  MCV 106.8* 94.2  95.3 100.0 96.2 95.4  PLT 34* 29*  28* 26* 24* 23*   Basic Metabolic Panel: Recent Labs  Lab 12/10/20 1305 12/11/20 0444 12/12/20 0135 12/13/20 0213 12/14/20 0053  NA 132* 136 135 136 136  K 4.3 4.2 4.1 4.0 3.9  CL 101 107 105 105 107  CO2 22 19* 20* 21* 21*  GLUCOSE 154* 102* 101* 111* 122*  BUN 21 18 16 13 15   CREATININE 1.40* 1.38* 1.39* 1.62* 1.44*  CALCIUM 9.1 8.8* 9.1 9.0 8.8*   GFR: Estimated Creatinine Clearance: 54.1 mL/min (A) (by C-G formula based on SCr of 1.44 mg/dL (H)). Liver Function Tests: Recent Labs  Lab 12/10/20 1305  AST 32  ALT 20  ALKPHOS 30*  BILITOT 0.9  PROT 7.5  ALBUMIN 4.4   No results for input(s): LIPASE, AMYLASE in the last 168 hours. No results for input(s): AMMONIA in the last 168 hours. Coagulation Profile: Recent Labs  Lab 12/10/20 1743  INR 1.1    Cardiac Enzymes: No results for input(s): CKTOTAL, CKMB, CKMBINDEX, TROPONINI in the last 168 hours. BNP (last 3 results) Recent Labs    12/09/20 1629  PROBNP 415*   HbA1C: No results for input(s): HGBA1C in the last 72 hours. CBG: Recent Labs  Lab 12/13/20 1218 12/13/20 1620 12/13/20 2052 12/14/20 0801 12/14/20 1136  GLUCAP 112* 107* 118* 112* 219*   Lipid Profile: No results for input(s): CHOL, HDL, LDLCALC, TRIG, CHOLHDL, LDLDIRECT in the last 72 hours. Thyroid Function Tests: No results for input(s): TSH, T4TOTAL, FREET4, T3FREE, THYROIDAB in the last 72 hours. Anemia Panel: No results for input(s): VITAMINB12, FOLATE, FERRITIN, TIBC, IRON, RETICCTPCT in the last 72 hours. Urine analysis:    Component Value Date/Time   COLORURINE STRAW (A) 12/10/2020 1743   APPEARANCEUR CLEAR 12/10/2020 1743   LABSPEC 1.005 12/10/2020 1743   PHURINE 6.0 12/10/2020 1743   GLUCOSEU NEGATIVE 12/10/2020 1743   HGBUR NEGATIVE 12/10/2020 Reading 12/10/2020 1743   KETONESUR NEGATIVE 12/10/2020 1743   PROTEINUR NEGATIVE 12/10/2020 1743   NITRITE NEGATIVE 12/10/2020 1743   LEUKOCYTESUR NEGATIVE 12/10/2020 1743   Sepsis Labs: @LABRCNTIP (procalcitonin:4,lacticidven:4)  ) Recent Results (from the past 240 hour(s))  Blood culture (routine single)     Status: None (Preliminary result)   Collection Time: 12/10/20  5:43 PM   Specimen: BLOOD  Result Value Ref Range Status   Specimen Description BLOOD SITE NOT SPECIFIED  Final   Special Requests   Final    BOTTLES DRAWN AEROBIC AND ANAEROBIC Blood Culture results may not be optimal due to an excessive volume of blood received in culture bottles   Culture   Final    NO GROWTH 4 DAYS Performed at [facility], [place] [address], [place], [place] [address]    Report Status PENDING  Incomplete  Resp Panel by RT-PCR (Flu A&B, Covid) Nasopharyngeal Swab     Status: None   Collection Time: 12/10/20  5:46 PM    Specimen: Nasopharyngeal Swab; Nasopharyngeal(NP) swabs in vial transport medium  Result Value Ref Range Status   SARS Coronavirus 2 by RT PCR NEGATIVE NEGATIVE Final    Comment: (NOTE) SARS-CoV-2 target nucleic acids are NOT DETECTED.  The SARS-CoV-2 RNA is generally detectable in upper respiratory specimens during the acute phase of infection. The lowest concentration of SARS-CoV-2 viral copies this assay can detect is 138 copies/mL. A negative result does not preclude SARS-Cov-2 infection and should not be used as the sole basis for treatment  or other patient management decisions. A negative result may occur with  improper specimen collection/handling, submission of specimen other than nasopharyngeal swab, presence of viral mutation(s) within the areas targeted by this assay, and inadequate number of viral copies(<138 copies/mL). A negative result must be combined with clinical observations, patient history, and epidemiological information. The expected result is Negative.  Fact Sheet for Patients:  EntrepreneurPulse.com.au  Fact Sheet for Healthcare Providers:  IncredibleEmployment.be  This test is no t yet approved or cleared by the [place] FDA and  has been authorized for detection and/or diagnosis of SARS-CoV-2 by FDA under an Emergency Use Authorization (EUA). This EUA will remain  in effect (meaning this test can be used) for the duration of the COVID-19 declaration under Section 564(b)(1) of the Act, 21 U.S.C.section 360bbb-3(b)(1), unless the authorization is terminated  or revoked sooner.       Influenza A by PCR NEGATIVE NEGATIVE Final   Influenza B by PCR NEGATIVE NEGATIVE Final    Comment: (NOTE) The Xpert Xpress SARS-CoV-2/FLU/RSV plus assay is intended as an aid in the diagnosis of influenza from Nasopharyngeal swab specimens and should not be used as a sole basis for treatment. Nasal washings and aspirates are  unacceptable for Xpert Xpress SARS-CoV-2/FLU/RSV testing.  Fact Sheet for Patients: EntrepreneurPulse.com.au  Fact Sheet for Healthcare Providers: IncredibleEmployment.be  This test is not yet approved or cleared by the [place] FDA and has been authorized for detection and/or diagnosis of SARS-CoV-2 by FDA under an Emergency Use Authorization (EUA). This EUA will remain in effect (meaning this test can be used) for the duration of the COVID-19 declaration under Section 564(b)(1) of the Act, 21 U.S.C. section 360bbb-3(b)(1), unless the authorization is terminated or revoked.  Performed at [facility], [place] [address], [place], [place] [address]   Culture, blood (single)     Status: None (Preliminary result)   Collection Time: 12/10/20  8:00 PM   Specimen: BLOOD  Result Value Ref Range Status   Specimen Description BLOOD RIGHT ANTECUBITAL  Final   Special Requests   Final    BOTTLES DRAWN AEROBIC AND ANAEROBIC Blood Culture adequate volume   Culture   Final    NO GROWTH 4 DAYS Performed at [facility], [place] [address], [place], [place] [address]    Report Status PENDING  Incomplete      Studies: No results found.  Scheduled Meds: . Chlorhexidine Gluconate Cloth  6 each Topical Daily  . cyanocobalamin  1,000 mcg Intramuscular QPC breakfast  . ferrous sulfate  325 mg Oral Q breakfast  . fluticasone  2 spray Each Nare q AM  . gabapentin  600 mg Oral QHS  . insulin aspart  0-5 Units Subcutaneous QHS  . insulin aspart  0-9 Units Subcutaneous TID WC  . loratadine  10 mg Oral Daily  . metoprolol succinate  50 mg Oral QHS  . pantoprazole  40 mg Oral q1800  . rosuvastatin  10 mg Oral QHS    Continuous Infusions: . sodium chloride 50 mL/hr at 12/14/20 0926  . ceFEPime (MAXIPIME) IV 2 g (12/14/20 0939)     LOS: 4 days     Kayleen Memos, MD [facility] Pager [phone]  If 7PM-7AM, please contact  night-coverage [email or web address] Password [email or web address] 12/14/2020, 1:35 PM

## 2020-12-14 NOTE — Evaluation (Signed)
Physical Therapy Evaluation/ Discharge Patient Details Name: Theodore Jenkins MRN: [id number] DOB: [birth year] Today's Date: 12/14/2020   History of Present Illness  75 yo admitted 12/30 after cardiology visit on 12/29 labs revealed pancytopenia. Pt with dyspnea for 1 month. PMhx: CAD, CABG, AS s/p AVR, CVA, GERD, AAA, HTN, HLD, DM, OSA  Clinical Impression  Pt very pleasant and eager to have bone marrow biopsy and get results to return home. Pt moving well without dyspnea and reports improved activity tolerance from admission. Pt walks daily on treadmill and elliptical and encouraged to continue as able to tolerate and periodically check pulse ox for tolerance. Pt able to perform all basic mobility, gait and stairs without assist with SpO2 >94%. No further acute therapy needs with pt aware and agreeable.      Follow Up Recommendations No PT follow up    Equipment Recommendations  None recommended by PT    Recommendations for Other Services       Precautions / Restrictions Precautions Precautions: None      Mobility  Bed Mobility Overal bed mobility: Independent                  Transfers Overall transfer level: Independent                  Ambulation/Gait Ambulation/Gait assistance: Independent Gait Distance (Feet): 700 Feet Assistive device: None Gait Pattern/deviations: WFL(Within Functional Limits)   Gait velocity interpretation: >4.37 ft/sec, indicative of normal walking speed General Gait Details: pt with good speed, stability and SPo2 94-97% on RA throughout  Stairs Stairs: Yes Stairs assistance: Modified independent (Device/Increase time) Stair Management: One rail Left;Alternating pattern;Forwards Number of Stairs: 3 General stair comments: good stability with use of rail  Wheelchair Mobility    Modified Rankin (Stroke Patients Only)       Balance Overall balance assessment: No apparent balance deficits (not formally assessed)                                            Pertinent Vitals/Pain Pain Assessment: No/denies pain    Home Living Family/patient expects to be discharged to:: Private residence Living Arrangements: Spouse/significant other Available Help at Discharge: Family;Available 24 hours/day Type of Home: House Home Access: Stairs to enter   [employer] of Steps: 3 Home Layout: Two level;Able to live on main level with bedroom/bathroom;Laundry or work area in [place]: Environmental consultant - 2 wheels;Cane - single point;Shower seat      Prior Function Level of Independence: Independent               Hand Dominance        Extremity/Trunk Assessment   Upper Extremity Assessment Upper Extremity Assessment: Overall WFL for tasks assessed    Lower Extremity Assessment Lower Extremity Assessment: Overall WFL for tasks assessed    Cervical / Trunk Assessment Cervical / Trunk Assessment: Normal  Communication   Communication: No difficulties  Cognition Arousal/Alertness: Awake/alert Behavior During Therapy: WFL for tasks assessed/performed Overall Cognitive Status: Within Functional Limits for tasks assessed                                        General Comments      Exercises     Assessment/Plan    PT  Assessment Patent does not need any further PT services  PT Problem List         PT Treatment Interventions      PT Goals (Current goals can be found in the Care Plan section)  Acute Rehab PT Goals PT Goal Formulation: All assessment and education complete, DC therapy    Frequency     Barriers to discharge        Co-evaluation               AM-PAC PT "6 Clicks" Mobility  Outcome Measure Help needed turning from your back to your side while in a flat bed without using bedrails?: None Help needed moving from lying on your back to sitting on the side of a flat bed without using bedrails?: None Help needed moving to and  from a bed to a chair (including a wheelchair)?: None Help needed standing up from a chair using your arms (e.g., wheelchair or bedside chair)?: None Help needed to walk in hospital room?: None Help needed climbing 3-5 steps with a railing? : None 6 Click Score: 24    End of Session   Activity Tolerance: Patient tolerated treatment well Patient left: in bed;with call bell/phone within reach;with family/visitor present Nurse Communication: Mobility status PT Visit Diagnosis: Other abnormalities of gait and mobility (R26.89)    Time: 4935-5217 PT Time Calculation (min) (ACUTE ONLY): 13 min   Charges:   PT Evaluation $PT Eval Low Complexity: [place], PT Acute Rehabilitation Services Pager: [phone] Office: [place] 12/14/2020, 9:43 AM

## 2020-12-14 NOTE — Progress Notes (Signed)
Pt is able to place himself on CPAP dream station for the night w/out any assistance from RT. Pt on home setting of 8 cmH2O w/ no oxygen bled into the system. RT will continue to monitor.

## 2020-12-14 NOTE — Care Management Important Message (Signed)
Important Message  Patient Details  Name: Theodore Jenkins MRN: [id number] Date of Birth: May 07, 1946   Medicare Important Message Given:  Yes     [name] Stefan Church 12/14/2020, 1:33 PM

## 2020-12-14 NOTE — Plan of Care (Signed)
  Problem: Education: Goal: Knowledge of General Education information will improve Description: Including pain rating scale, medication(s)/side effects and non-pharmacologic comfort measures Outcome: Progressing   Problem: Health Behavior/Discharge Planning: Goal: Ability to manage health-related needs will improve Outcome: Progressing   Problem: Clinical Measurements: Goal: Will remain free from infection Outcome: Progressing   Problem: Pain Managment: Goal: General experience of comfort will improve Outcome: Progressing   Problem: Safety: Goal: Ability to remain free from injury will improve Outcome: Progressing

## 2020-12-14 NOTE — Progress Notes (Signed)
Overall, there really has not been any change with Theodore Jenkins.  He had a CT scan done on Friday.  This was unremarkable.  There is no splenomegaly.  There is no lymphadenopathy.  He is going for a bone marrow test today.  I am confident that this will show [place] what is going on.  His CBC today shows white cell count 2.5.  Hemoglobin 9.2.  Platelet count 23,000.  MCV is 95.  BUN is 15 creatinine 1.44.  There is no change on the physical exam.  I know he is getting vitamin B-12.  I do not think that this is can be a problem with respect to the pancytopenia.  Once he has the bone marrow biopsy done, he can probably go home.  We can then follow him up as an outpatient and see what is going on.  I have to believe that he is going to have myelodysplasia or some other type of bone marrow issue.  I appreciate the great care that he is getting from everybody on 2 W.  Lattie Haw, MD  Psalm 31:24

## 2020-12-15 ENCOUNTER — Other Ambulatory Visit: Payer: Self-pay

## 2020-12-15 ENCOUNTER — Inpatient Hospital Stay (HOSPITAL_COMMUNITY)
Admit: 2020-12-15 | Discharge: 2020-12-15 | Disposition: A | Discharge status: Home / Self Care | Payer: PPO | Attending: Hematology & Oncology | Admitting: Hematology & Oncology

## 2020-12-15 DIAGNOSIS — D61818 Other pancytopenia: Secondary | ICD-10-CM | POA: Diagnosis not present

## 2020-12-15 LAB — CBC WITH DIFFERENTIAL/PLATELET
Abs Immature Granulocytes: 0.01 10*3/uL (ref 0.00–0.07)
Basophils Absolute: 0 10*3/uL (ref 0.0–0.1)
Basophils Relative: 1 %
Eosinophils Absolute: 0 10*3/uL (ref 0.0–0.5)
Eosinophils Relative: 1 %
HCT: 27.4 % — ABNORMAL LOW (ref 39.0–52.0)
Hemoglobin: 9.3 g/dL — ABNORMAL LOW (ref 13.0–17.0)
Immature Granulocytes: 1 %
Lymphocytes Relative: 20 %
Lymphs Abs: 0.4 10*3/uL — ABNORMAL LOW (ref 0.7–4.0)
MCH: 32.5 pg (ref 26.0–34.0)
MCHC: 33.9 g/dL (ref 30.0–36.0)
MCV: 95.8 fL (ref 80.0–100.0)
Monocytes Absolute: 0.3 10*3/uL (ref 0.1–1.0)
Monocytes Relative: 14 %
Neutro Abs: 1.2 10*3/uL — ABNORMAL LOW (ref 1.7–7.7)
Neutrophils Relative %: 63 %
Platelets: 19 10*3/uL — CL (ref 150–400)
RBC: 2.86 MIL/uL — ABNORMAL LOW (ref 4.22–5.81)
RDW: 17.9 % — ABNORMAL HIGH (ref 11.5–15.5)
WBC: 1.9 10*3/uL — ABNORMAL LOW (ref 4.0–10.5)
nRBC: 0 % (ref 0.0–0.2)

## 2020-12-15 LAB — BASIC METABOLIC PANEL
Anion gap: 10 (ref 5–15)
BUN: 17 mg/dL (ref 8–23)
CO2: 21 mmol/L — ABNORMAL LOW (ref 22–32)
Calcium: 8.8 mg/dL — ABNORMAL LOW (ref 8.9–10.3)
Chloride: 105 mmol/L (ref 98–111)
Creatinine, Ser: 1.31 mg/dL — ABNORMAL HIGH (ref 0.61–1.24)
GFR, Estimated: 58 mL/min — ABNORMAL LOW (ref >60)
Glucose, Bld: 113 mg/dL — ABNORMAL HIGH (ref 70–99)
Potassium: 4 mmol/L (ref 3.5–5.1)
Sodium: 136 mmol/L (ref 135–145)

## 2020-12-15 LAB — GLUCOSE, CAPILLARY
Glucose-Capillary: 113 mg/dL — ABNORMAL HIGH (ref 70–99)
Glucose-Capillary: 151 mg/dL — ABNORMAL HIGH (ref 70–99)

## 2020-12-15 LAB — CULTURE, BLOOD (SINGLE)
Culture: NO GROWTH
Culture: NO GROWTH
Special Requests: ADEQUATE

## 2020-12-15 MED ORDER — LIDOCAINE HCL 1 % IJ SOLN
INTRAMUSCULAR | Status: AC
  Filled 2020-12-15: qty 20

## 2020-12-15 MED ORDER — FENTANYL CITRATE (PF) 100 MCG/2ML IJ SOLN
INTRAMUSCULAR | Status: AC | PRN
Start: 2020-12-15 — End: 2020-12-15
  Administered 2020-12-15 (×2): 25 ug via INTRAVENOUS

## 2020-12-15 MED ORDER — MIDAZOLAM HCL 2 MG/2ML IJ SOLN
INTRAMUSCULAR | Status: AC | PRN
Start: 2020-12-15 — End: 2020-12-15
  Administered 2020-12-15: 1 mg via INTRAVENOUS
  Administered 2020-12-15: 0.5 mg via INTRAVENOUS

## 2020-12-15 MED ORDER — MIDAZOLAM HCL 2 MG/2ML IJ SOLN
INTRAMUSCULAR | Status: AC
  Filled 2020-12-15: qty 4

## 2020-12-15 MED ORDER — FENTANYL CITRATE (PF) 100 MCG/2ML IJ SOLN
INTRAMUSCULAR | Status: AC
  Filled 2020-12-15: qty 4

## 2020-12-15 NOTE — Discharge Summary (Signed)
Physician Discharge Summary  Theodore Jenkins UUE:[id number] DOB: 06/15/1946 DOA: 12/10/2020  PCP: Karlene Einstein, MD  Admit date: 12/10/2020 Discharge date: 12/15/2020  Admitted From: Home Disposition: Home  Recommendations for Outpatient Follow-up:  1. Follow up with PCP in 1-2 weeks 2. Follow-up with medical oncology, Dr. Marin Olp as scheduled 3. Please obtain BMP/CBC in one week to assess pancytopenia and renal function 4. Discontinued home aspirin due to low platelets 5. Please follow up on the following pending results: Bone marrow biopsy results  Home Health: No Equipment/Devices: None  Discharge Condition: Stable CODE STATUS: Full code Diet recommendation: Heart healthy/consistent carbohydrate diet  History of present illness:  Theodore Jenkins a 74 y.o.malewith medical history significant ofCAD status post PCI in 2005 and CABG in 2018, severe aortic stenosis status post bioprosthetic AVR in 2018, endocarditis in May 2021, CVA status post right CEA with subsequent right internal carotid artery occlusion, GERD, Barrett's esophagus, AAA, hypertension, hyperlipidemia, non-insulin-dependent type II diabetes, OSA on CPAP presenting to the ED for evaluation of abnormal labs. Patient went to see cardiology yesterday and labs came back showing pancytopenia (WBC 2.6, hemoglobin 7.6, platelet count 33K).Patient reports 1 month history of progressively worsening dyspnea on exertion and fatigue. He was previously able to walk on the treadmill for 20 minutes every day without resting and now his exercise capacity is reduced to only 5 minutes at a time. Reports history of epistaxis a few weeks ago which resolved with no recurrence. He has noticed purpura on his arms but no oral mucosal bleeding. Reports having chronic dark stools which he attributes to taking an iron supplement for the past few months. Denies hematochezia, hematemesis, or hematuria.  In the ED, patient was afebrile, vital  signs stable. WBC 1.9 (ANC 1.1), hemoglobin 7.5, hematocrit 23.6, platelet 34K.Sodium 132, potassium 4.3, chloride 101, bicarb 22, BUN 21, creatinine 1.4, glucose 154. No elevation of LFTs. UA not suggestive of infection. INR 1.1. Lactic acid 1.0. Blood culture x2no growth to date. SARS-CoV-2 PCR test and influenza panel both negative. Chest x-ray negative for acute intrathoracic process. Patient was started on admission with broad-spectrum IV antibiotics vancomycin, Zosyn.  Hematology/oncology was consulted.  Hospital service consulted for further evaluation and treatment of new onset pancytopenia.  Hospital course:  Pancytopenia Patient presenting to the hospital after routine labs performed by cardiology notable for pancytopenia.  No signs of overt bleeding.  No evidence of active infectious process.  Patient was initially started on broad-spectrum antibiotics with IV vancomycin and Zosyn which was the escalated to cefepime on 1 01/31/2021.  She was transfused with 2 units PRBCs on 12/11/2020.  Hematology/oncology was consulted and followed during hospital course.  Peripheral blood smear reviewed with normochromic normocytic RBCs with no nucleated RBCs, no teardrop cells, no rouleaux formation, no schistocytes/spherocytes, no blasts or hypersegmented polys.  CT abdomen/pelvis with no lymphadenopathy or splenomegaly.  Underwent bone marrow biopsy by interventional radiology on 12/15/2020.  Discontinued home aspirin.  Outpatient follow-up with hematology/oncology, Dr. Marin Olp as scheduled.  Acute renal failure Etiology likely multifactorial, prerenal secondary to dehydration versus ATN while on IV vancomycin and Zosyn.  Creatinine peaked at 1.62 and trended down with gentle IV fluid hydration.  Creatinine at time of discharge 1.31.  Recommend repeat BMP at next visit.  Low vitamin B12 level Vitamin B12 level low at 154.  Repleted with IM B12 1000 mcg x 5 while inpatient.  Outpatient follow-up  PCP.  Aortic stenosis status post bioprosthetic AVR TEE May 2021 and  TTE August 2021 with thickening of one of the leaflets of the bioprosthetic aortic valve.  Was treated for endocarditis in May 2021.  Outpatient follow-up with cardiology.  OSA: Continue nocturnal CPAP  Benign essential hypertension Continue home Toprol-XL 50 mg p.o. daily and lisinopril 5 mg p.o. daily.  Discontinued home aspirin due to thrombocytopenia.  Type 2 diabetes mellitus Hemoglobin A1c 5.6 on 12/11/2020, well controlled.  Sitagliptin-Metformin 50-1000 mg twice daily.  HLD: Continue Crestor 10 mg p.o. nightly  Discharge Diagnoses:  Principal Problem:   Pancytopenia ([place]) Active Problems:   Coronary artery disease involving native coronary artery of native heart without angina pectoris   Essential hypertension   S/P aortic valve replacement with bioprosthetic valve    AKI (acute kidney injury) [facility])    Discharge Instructions  Discharge Instructions    Call MD for:  difficulty breathing, headache or visual disturbances   Complete by: As directed    Call MD for:  extreme fatigue   Complete by: As directed    Call MD for:  persistant dizziness or light-headedness   Complete by: As directed    Call MD for:  persistant nausea and vomiting   Complete by: As directed    Call MD for:  severe uncontrolled pain   Complete by: As directed    Call MD for:  temperature >100.4   Complete by: As directed    Diet - low sodium heart healthy   Complete by: As directed    Increase activity slowly   Complete by: As directed      Allergies as of 12/15/2020      Reactions   Oxycodone Nausea And Vomiting      Medication List    STOP taking these medications   aspirin 81 MG EC tablet     TAKE these medications   acetaminophen 500 MG tablet Commonly known as: TYLENOL Take 1,000 mg by mouth every 8 (eight) hours as needed for mild pain (or headaches).   amoxicillin 500 MG capsule Commonly known as:  AMOXIL Take 2,000 mg by mouth See admin instructions. Take 2,000 mg by mouth one hour prior to dental appointments   ascorbic acid 500 MG tablet Commonly known as: VITAMIN C Take 500 mg by mouth daily with breakfast.   ferrous sulfate 325 (65 FE) MG tablet Take 325 mg by mouth daily with breakfast.   fluticasone 50 MCG/ACT nasal spray Commonly known as: FLONASE Place 2 sprays into both nostrils in the morning.   gabapentin 300 MG capsule Commonly known as: NEURONTIN Take 600 mg by mouth at bedtime.   lisinopril 5 MG tablet Commonly known as: ZESTRIL TAKE 1 TABLET BY MOUTH DAILY What changed: when to take this   loratadine 10 MG tablet Commonly known as: CLARITIN Take 10 mg by mouth daily.   metoprolol succinate 50 MG 24 hr tablet Commonly known as: TOPROL-XL Take 50 mg by mouth at bedtime.   nitroGLYCERIN 0.4 MG SL tablet Commonly known as: NITROSTAT Place 1 tablet (0.4 mg total) under the tongue every 5 (five) minutes as needed for chest pain.   ONE-A-DAY 50 PLUS PO Take 1 tablet by mouth daily with breakfast.   OneTouch Verio test strip Generic drug: glucose blood 1 each by Other route daily.   polyethylene glycol 17 g packet Commonly known as: MIRALAX / GLYCOLAX Take 17 g by mouth daily. What changed: when to take this   PriLOSEC OTC 20 MG tablet Generic drug: omeprazole Take 20 mg by  mouth daily at 6 PM.   rosuvastatin 10 MG tablet Commonly known as: CRESTOR Take 10 mg by mouth at bedtime.   sitaGLIPtin-metformin 50-1000 MG tablet Commonly known as: JANUMET Take 1 tablet by mouth 2 (two) times daily with a meal.       Follow-up Information    Haimes, Youlanda Roys, MD. Schedule an appointment as soon as possible for a visit in 1 week(s).   Specialty: Family Medicine Contact information: [address] [place] [address] [phone]        Burnell Blanks, MD .   Specialty: Cardiology Contact information: [place]  [address] [place] [address] [phone]        Volanda Napoleon, MD. Schedule an appointment as soon as possible for a visit in 1 week(s).   Specialty: Oncology Contact information: [place] [address] [phone]              Allergies  Allergen Reactions  . Oxycodone Nausea And Vomiting    Consultations:  Hematology/oncology: Dr. Marin Olp  Interventional radiology   Procedures/Studies: CT CHEST ABDOMEN PELVIS W CONTRAST  Result Date: 12/11/2020 CLINICAL DATA:  75 year old male with history of hematologic malignancy. Pancytopenia. Evaluate for hepatosplenomegaly and lymphadenopathy. EXAM: CT CHEST, ABDOMEN, AND PELVIS WITH CONTRAST TECHNIQUE: Multidetector CT imaging of the chest, abdomen and pelvis was performed following the standard protocol during bolus administration of intravenous contrast. CONTRAST:  134m OMNIPAQUE IOHEXOL 300 MG/ML  SOLN COMPARISON:  No priors. FINDINGS: CT CHEST FINDINGS Cardiovascular: Heart size is normal. There is no significant pericardial fluid, thickening or pericardial calcification. There is aortic atherosclerosis, as well as atherosclerosis of the great vessels of the mediastinum and the coronary arteries, including calcified atherosclerotic plaque in the left main, left anterior descending, left circumflex and right coronary arteries. Status post median sternotomy for aortic valve replacement as well as CABG including LIMA to the LAD. Mediastinum/Nodes: No pathologically enlarged mediastinal or hilar lymph nodes. Esophagus is unremarkable in appearance. No axillary lymphadenopathy. Lungs/Pleura: Small pulmonary nodules measuring 4 mm or less in size are noted in the lungs bilaterally, largest of which is in the inferior segment of the lingula (axial image 107 of series 4). No other larger more suspicious appearing pulmonary nodules or masses are noted. No acute consolidative airspace disease. No pleural  effusions. Musculoskeletal: Median sternotomy wires. There are no aggressive appearing lytic or blastic lesions noted in the visualized portions of the skeleton. CT ABDOMEN PELVIS FINDINGS Hepatobiliary: No suspicious cystic or solid hepatic lesions. No intra or extrahepatic biliary ductal dilatation. Gallbladder is normal in appearance. Pancreas: No pancreatic mass. No pancreatic ductal dilatation. No pancreatic or peripancreatic fluid collections or inflammatory changes. Spleen: Unremarkable. Adrenals/Urinary Tract: Bilateral kidneys and adrenal glands are normal in appearance. No hydroureteronephrosis. Urinary bladder is normal in appearance. Stomach/Bowel: The appearance of the stomach is normal. No pathologic dilatation of small bowel or colon. Numerous colonic diverticulae are noted, particularly in the sigmoid colon, without surrounding inflammatory changes to suggest an acute diverticulitis at this time. Normal appendix. Vascular/Lymphatic: Aortic atherosclerosis with fusiform ectasia of the infrarenal abdominal aorta which measures up to 2.7 cm in diameter. High-grade stenosis of the proximal left common iliac artery (axial image 94 of series 3). No lymphadenopathy noted in the abdomen or pelvis. Reproductive: Prostate gland and seminal vesicles are unremarkable in appearance. Other: No significant volume of ascites.  No pneumoperitoneum. Musculoskeletal: There are no aggressive appearing lytic or blastic  lesions noted in the visualized portions of the skeleton. IMPRESSION: 1. No lymphadenopathy noted in the chest, abdomen or pelvis, and no hepatosplenomegaly. 2. Small pulmonary nodules in the lungs bilaterally measuring 4 mm or less in size, nonspecific, but statistically likely benign. No follow-up needed if patient is low-risk (and has no known or suspected primary neoplasm). Non-contrast chest CT can be considered in 12 months if patient is high-risk. This recommendation follows the consensus statement:  Guidelines for Management of Incidental Pulmonary Nodules Detected on CT Images: From the Fleischner Society 2017; Radiology 2017; 284:228-243. 3. Colonic diverticulosis without evidence of acute diverticulitis at this time. 4. Aortic atherosclerosis, in addition to left main and 3 vessel coronary artery disease. Status post median sternotomy for CABG including LIMA to the LAD. There is also fusiform ectasia of the infrarenal abdominal aorta which measures up to 2.7 cm in diameter, as well as high-grade stenosis of the proximal left common iliac artery. 5. Additional incidental findings, as above. Electronically Signed   By: Vinnie Langton M.D.   On: 12/11/2020 13:34   DG Chest Portable 1 View  Result Date: 12/10/2020 CLINICAL DATA:  Anemia, epistaxis EXAM: PORTABLE CHEST 1 VIEW COMPARISON:  04/29/2020 FINDINGS: Single frontal view of the chest demonstrates a stable cardiac silhouette allowing for differences in technique and positioning. Aortic valve prosthesis and median sternotomy wires again noted. No airspace disease, effusion, or pneumothorax. No acute bony abnormalities. IMPRESSION: 1. No acute intrathoracic process. Electronically Signed   By: Randa Ngo M.D.   On: 12/10/2020 17:55   ECHOCARDIOGRAM COMPLETE  Result Date: 12/11/2020    ECHOCARDIOGRAM REPORT   Patient Name:   YOUCEF KLAS [facility] Date of Exam: 12/11/2020 Medical Rec #:  [id number]   Height:       69.0 in Accession #:    [id number]  Weight:       220.9 lb Date of Birth:  02-15-1946  BSA:          2.155 m Patient Age:    68 years    BP:           134/72 mmHg Patient Gender: M           HR:           64 bpm. Exam Location:  Inpatient Procedure: 2D Echo, 3D Echo, Cardiac Doppler and Color Doppler Indications:    I35.0 Nonrheumatic aortic (valve) stenosis.  History:        Patient has prior history of Echocardiogram examinations, most                 recent 07/13/2020. CAD, Prior CABG, Aortic Valve Disease,                 Prosthetic Valve  Complications and Endocarditis,                 Signs/Symptoms:Bacteremia and Chest Pain; Risk                 Factors:Dyslipidemia, Diabetes and Hypertension. Severe aortic                 stenosis. Aortic valve replacement.                 Aortic Valve: 25 mm Magna bioprosthetic valve is present in the                 aortic position. Procedure Date: 2018.  Sonographer:    Roseanna Rainbow RDCS Referring Phys: [id number] Shela Leff  IMPRESSIONS  1. Left ventricular ejection fraction, by estimation, is 55 to 60%. The left ventricle has normal function. The left ventricle has no regional wall motion abnormalities. There is moderate concentric left ventricular hypertrophy. Left ventricular diastolic parameters are consistent with Grade II diastolic dysfunction (pseudonormalization). Elevated left atrial pressure.  2. Right ventricular systolic function is normal. The right ventricular size is normal. There is normal pulmonary artery systolic pressure.  3. Left atrial size was mildly dilated.  4. Right atrial size was mildly dilated.  5. The mitral valve is normal in structure. Trivial mitral valve regurgitation.  6. The aortic valve has been repaired/replaced. There is mild thickening of the aortic valve. Aortic valve regurgitation is not visualized. No aortic stenosis is present. There is a 25 mm Magna bioprosthetic valve present in the aortic position. Procedure Date: 2018. Echo findings are consistent with normal structure and function of the aortic valve prosthesis. Aortic valve mean gradient measures 8.7 mmHg. Aortic valve Vmax measures 2.05 m/s. Comparison(s): No significant change from prior study. Prior images reviewed side by side. FINDINGS  Left Ventricle: Left ventricular ejection fraction, by estimation, is 55 to 60%. The left ventricle has normal function. The left ventricle has no regional wall motion abnormalities. The left ventricular internal cavity size was normal in size. There is  moderate concentric  left ventricular hypertrophy. Abnormal (paradoxical) septal motion consistent with post-operative status. Left ventricular diastolic parameters are consistent with Grade II diastolic dysfunction (pseudonormalization). Elevated left atrial pressure. Right Ventricle: The right ventricular size is normal. No increase in right ventricular wall thickness. Right ventricular systolic function is normal. There is normal pulmonary artery systolic pressure. The tricuspid regurgitant velocity is 2.49 m/s, and  with an assumed right atrial pressure of 3 mmHg, the estimated right ventricular systolic pressure is 29.5 mmHg. Left Atrium: Left atrial size was mildly dilated. Right Atrium: Right atrial size was mildly dilated. Prominent Chiari network. Pericardium: There is no evidence of pericardial effusion. Mitral Valve: The mitral valve is normal in structure. Trivial mitral valve regurgitation. Tricuspid Valve: The tricuspid valve is normal in structure. Tricuspid valve regurgitation is mild. Aortic Valve: The aortic valve has been repaired/replaced. There is mild thickening of the aortic valve. Aortic valve regurgitation is not visualized. No aortic stenosis is present. Aortic valve mean gradient measures 8.7 mmHg. Aortic valve peak gradient  measures 16.9 mmHg. Aortic valve area, by VTI measures 2.86 cm. There is a 25 mm Magna bioprosthetic valve present in the aortic position. Procedure Date: 2018. Echo findings are consistent with normal structure and function of the aortic valve prosthesis. Pulmonic Valve: The pulmonic valve was grossly normal. Pulmonic valve regurgitation is not visualized. Aorta: The aortic root and ascending aorta are structurally normal, with no evidence of dilitation. IAS/Shunts: No atrial level shunt detected by color flow Doppler.  LEFT VENTRICLE PLAX 2D LVIDd:         4.30 cm      Diastology LVIDs:         2.80 cm      LV e' medial:    7.94 cm/s LV PW:         1.60 cm      LV E/e' medial:  12.0 LV  IVS:        1.80 cm      LV e' lateral:   8.54 cm/s LVOT diam:     2.40 cm      LV E/e' lateral: 11.2 LV SV:  123 LV SV Index:   57 LVOT Area:     4.52 cm                              3D Volume EF: LV Volumes (MOD)            3D EF:        66 % LV vol d, MOD A2C: 117.0 ml LV EDV:       183 ml LV vol d, MOD A4C: 99.8 ml  LV ESV:       62 ml LV vol s, MOD A2C: 43.6 ml  LV SV:        121 ml LV vol s, MOD A4C: 40.3 ml LV SV MOD A2C:     73.4 ml LV SV MOD A4C:     99.8 ml LV SV MOD BP:      65.7 ml RIGHT VENTRICLE            IVC RV S prime:     6.64 cm/s  IVC diam: 2.10 cm TAPSE (M-mode): 1.1 cm LEFT ATRIUM             Index       RIGHT ATRIUM           Index LA diam:        4.10 cm 1.90 cm/m  RA Area:     20.30 cm LA Vol (A2C):   55.4 ml 25.71 ml/m RA Volume:   56.90 ml  26.40 ml/m LA Vol (A4C):   58.2 ml 27.01 ml/m LA Biplane Vol: 56.5 ml 26.22 ml/m  AORTIC VALVE AV Area (Vmax):    2.71 cm AV Area (Vmean):   2.82 cm AV Area (VTI):     2.86 cm AV Vmax:           205.33 cm/s AV Vmean:          137.667 cm/s AV VTI:            0.429 m AV Peak Grad:      16.9 mmHg AV Mean Grad:      8.7 mmHg LVOT Vmax:         123.00 cm/s LVOT Vmean:        85.900 cm/s LVOT VTI:          0.271 m LVOT/AV VTI ratio: 0.63  AORTA Ao Root diam: 3.10 cm Ao Asc diam:  3.60 cm MITRAL VALVE               TRICUSPID VALVE MV Area (PHT): 2.91 cm    TR Peak grad:   24.8 mmHg MV Decel Time: 261 msec    TR Vmax:        249.00 cm/s MV E velocity: 95.60 cm/s MV A velocity: 56.80 cm/s  SHUNTS MV E/A ratio:  1.68        Systemic VTI:  0.27 m                            Systemic Diam: 2.40 cm Dani Gobble Croitoru MD Electronically signed by Sanda Klein MD Signature Date/Time: 12/11/2020/1:02:02 PM    Final       Subjective: Patient seen and examined at bedside, resting comfortably.  Just returned from IR for bone marrow biopsy.  Spouse present at bedside.  No other complaints or concerns at this time.  Ready for discharge home.  Discussed  with patient and spouse needs for close follow-up with hematology following discharge.  Denies headache, no fever/chills/night sweats, no nausea/vomiting/diarrhea, no chest pain, no palpitations, no shortness of breath, no abdominal pain, no weakness, no paresthesias.  No acute events overnight per nursing staff.  Discharge Exam: Vitals:   12/14/20 1523 12/14/20 2245  BP: 123/73 (!) 142/66  Pulse: 69 66  Resp: 17 20  Temp: 98.6 F (37 C) 98.6 F (37 C)  SpO2: 100% 98%   Vitals:   12/14/20 0442 12/14/20 0940 12/14/20 1523 12/14/20 2245  BP: (!) 128/53  123/73 (!) 142/66  Pulse: 70  69 66  Resp:   17 20  Temp: 98.4 F (36.9 C)  98.6 F (37 C) 98.6 F (37 C)  TempSrc: Oral   Oral  SpO2: 99% 97% 100% 98%  Weight:      Height:        General: Pt is alert, awake, not in acute distress Cardiovascular: RRR, S1/S2 +, no rubs, no gallops Respiratory: CTA bilaterally, no wheezing, no rhonchi, on room air Abdominal: Soft, NT, ND, bowel sounds + Extremities: no edema, no cyanosis    The results of significant diagnostics from this hospitalization (including imaging, microbiology, ancillary and laboratory) are listed below for reference.     Microbiology: Recent Results (from the past 240 hour(s))  Blood culture (routine single)     Status: None   Collection Time: 12/10/20  5:43 PM   Specimen: BLOOD  Result Value Ref Range Status   Specimen Description BLOOD SITE NOT SPECIFIED  Final   Special Requests   Final    BOTTLES DRAWN AEROBIC AND ANAEROBIC Blood Culture results may not be optimal due to an excessive volume of blood received in culture bottles   Culture   Final    NO GROWTH 5 DAYS Performed at [facility], [place] [address], [place], [place] [address]    Report Status 12/15/2020 FINAL  Final  Resp Panel by RT-PCR (Flu A&B, Covid) Nasopharyngeal Swab     Status: None   Collection Time: 12/10/20  5:46 PM   Specimen: Nasopharyngeal Swab; Nasopharyngeal(NP) swabs in  vial transport medium  Result Value Ref Range Status   SARS Coronavirus 2 by RT PCR NEGATIVE NEGATIVE Final    Comment: (NOTE) SARS-CoV-2 target nucleic acids are NOT DETECTED.  The SARS-CoV-2 RNA is generally detectable in upper respiratory specimens during the acute phase of infection. The lowest concentration of SARS-CoV-2 viral copies this assay can detect is 138 copies/mL. A negative result does not preclude SARS-Cov-2 infection and should not be used as the sole basis for treatment or other patient management decisions. A negative result may occur with  improper specimen collection/handling, submission of specimen other than nasopharyngeal swab, presence of viral mutation(s) within the areas targeted by this assay, and inadequate number of viral copies(<138 copies/mL). A negative result must be combined with clinical observations, patient history, and epidemiological information. The expected result is Negative.  Fact Sheet for Patients:  EntrepreneurPulse.com.au  Fact Sheet for Healthcare Providers:  IncredibleEmployment.be  This test is no t yet approved or cleared by the [place] FDA and  has been authorized for detection and/or diagnosis of SARS-CoV-2 by FDA under an Emergency Use Authorization (EUA). This EUA will remain  in effect (meaning this test can be used) for the duration of the COVID-19 declaration under Section 564(b)(1) of the Act, 21 U.S.C.section 360bbb-3(b)(1), unless the authorization is terminated  or revoked sooner.  Influenza A by PCR NEGATIVE NEGATIVE Final   Influenza B by PCR NEGATIVE NEGATIVE Final    Comment: (NOTE) The Xpert Xpress SARS-CoV-2/FLU/RSV plus assay is intended as an aid in the diagnosis of influenza from Nasopharyngeal swab specimens and should not be used as a sole basis for treatment. Nasal washings and aspirates are unacceptable for Xpert Xpress SARS-CoV-2/FLU/RSV testing.  Fact  Sheet for Patients: EntrepreneurPulse.com.au  Fact Sheet for Healthcare Providers: IncredibleEmployment.be  This test is not yet approved or cleared by the [place] FDA and has been authorized for detection and/or diagnosis of SARS-CoV-2 by FDA under an Emergency Use Authorization (EUA). This EUA will remain in effect (meaning this test can be used) for the duration of the COVID-19 declaration under Section 564(b)(1) of the Act, 21 U.S.C. section 360bbb-3(b)(1), unless the authorization is terminated or revoked.  Performed at [facility], [place] [address] [place], [place] [address]   Culture, blood (single)     Status: None   Collection Time: 12/10/20  8:00 PM   Specimen: BLOOD  Result Value Ref Range Status   Specimen Description BLOOD RIGHT ANTECUBITAL  Final   Special Requests   Final    BOTTLES DRAWN AEROBIC AND ANAEROBIC Blood Culture adequate volume   Culture   Final    NO GROWTH 5 DAYS Performed at [facility], [place] [address], [place], [place] [address]    Report Status 12/15/2020 FINAL  Final     Labs: BNP (last 3 results) No results for input(s): BNP in the last 8760 hours. Basic Metabolic Panel: Recent Labs  Lab 12/11/20 0444 12/12/20 0135 12/13/20 0213 12/14/20 0053 12/15/20 0324  NA 136 135 136 136 136  K 4.2 4.1 4.0 3.9 4.0  CL 107 105 105 107 105  CO2 19* 20* 21* 21* 21*  GLUCOSE 102* 101* 111* 122* 113*  BUN 18 16 13 15 17   CREATININE 1.38* 1.39* 1.62* 1.44* 1.31*  CALCIUM 8.8* 9.1 9.0 8.8* 8.8*   Liver Function Tests: Recent Labs  Lab 12/10/20 1305  AST 32  ALT 20  ALKPHOS 30*  BILITOT 0.9  PROT 7.5  ALBUMIN 4.4   No results for input(s): LIPASE, AMYLASE in the last 168 hours. No results for input(s): AMMONIA in the last 168 hours. CBC: Recent Labs  Lab 12/11/20 0444 12/12/20 0135 12/13/20 0213 12/14/20 0053 12/15/20 0324  WBC 2.5*  2.6* 2.4* 2.2* 2.5* 1.9*  NEUTROABS 1.6*  1.4* 1.1* 1.5* 1.2*  HGB 9.5*  9.4* 9.4* 9.4* 9.2* 9.3*  HCT 27.4*  28.4* 29.6* 28.0* 26.9* 27.4*  MCV 94.2  95.3 100.0 96.2 95.4 95.8  PLT 29*  28* 26* 24* 23* 19*   Cardiac Enzymes: No results for input(s): CKTOTAL, CKMB, CKMBINDEX, TROPONINI in the last 168 hours. BNP: Invalid input(s): POCBNP CBG: Recent Labs  Lab 12/14/20 0801 12/14/20 1136 12/14/20 1637 12/14/20 2035 12/15/20 0808  GLUCAP 112* 219* 88 128* 113*   D-Dimer No results for input(s): DDIMER in the last 72 hours. Hgb A1c No results for input(s): HGBA1C in the last 72 hours. Lipid Profile No results for input(s): CHOL, HDL, LDLCALC, TRIG, CHOLHDL, LDLDIRECT in the last 72 hours. Thyroid function studies No results for input(s): TSH, T4TOTAL, T3FREE, THYROIDAB in the last 72 hours.  Invalid input(s): FREET3 Anemia work up No results for input(s): VITAMINB12, FOLATE, FERRITIN, TIBC, IRON, RETICCTPCT in the last 72 hours. Urinalysis    Component Value Date/Time   COLORURINE STRAW (A) 12/10/2020 1743  APPEARANCEUR CLEAR 12/10/2020 1743   LABSPEC 1.005 12/10/2020 1743   PHURINE 6.0 12/10/2020 1743   GLUCOSEU NEGATIVE 12/10/2020 1743   HGBUR NEGATIVE 12/10/2020 1743   BILIRUBINUR NEGATIVE 12/10/2020 1743   KETONESUR NEGATIVE 12/10/2020 1743   PROTEINUR NEGATIVE 12/10/2020 1743   NITRITE NEGATIVE 12/10/2020 1743   LEUKOCYTESUR NEGATIVE 12/10/2020 1743   Sepsis Labs Invalid input(s): PROCALCITONIN,  WBC,  LACTICIDVEN Microbiology Recent Results (from the past 240 hour(s))  Blood culture (routine single)     Status: None   Collection Time: 12/10/20  5:43 PM   Specimen: BLOOD  Result Value Ref Range Status   Specimen Description BLOOD SITE NOT SPECIFIED  Final   Special Requests   Final    BOTTLES DRAWN AEROBIC AND ANAEROBIC Blood Culture results may not be optimal due to an excessive volume of blood received in culture bottles   Culture   Final    NO GROWTH 5 DAYS Performed at [facility], [place] [address], [place], [place] [address]    Report Status 12/15/2020 FINAL  Final  Resp Panel by RT-PCR (Flu A&B, Covid) Nasopharyngeal Swab     Status: None   Collection Time: 12/10/20  5:46 PM   Specimen: Nasopharyngeal Swab; Nasopharyngeal(NP) swabs in vial transport medium  Result Value Ref Range Status   SARS Coronavirus 2 by RT PCR NEGATIVE NEGATIVE Final    Comment: (NOTE) SARS-CoV-2 target nucleic acids are NOT DETECTED.  The SARS-CoV-2 RNA is generally detectable in upper respiratory specimens during the acute phase of infection. The lowest concentration of SARS-CoV-2 viral copies this assay can detect is 138 copies/mL. A negative result does not preclude SARS-Cov-2 infection and should not be used as the sole basis for treatment or other patient management decisions. A negative result may occur with  improper specimen collection/handling, submission of specimen other than nasopharyngeal swab, presence of viral mutation(s) within the areas targeted by this assay, and inadequate number of viral copies(<138 copies/mL). A negative result must be combined with clinical observations, patient history, and epidemiological information. The expected result is Negative.  Fact Sheet for Patients:  EntrepreneurPulse.com.au  Fact Sheet for Healthcare Providers:  IncredibleEmployment.be  This test is no t yet approved or cleared by the [place] FDA and  has been authorized for detection and/or diagnosis of SARS-CoV-2 by FDA under an Emergency Use Authorization (EUA). This EUA will remain  in effect (meaning this test can be used) for the duration of the COVID-19 declaration under Section 564(b)(1) of the Act, 21 U.S.C.section 360bbb-3(b)(1), unless the authorization is terminated  or revoked sooner.       Influenza A by PCR NEGATIVE NEGATIVE Final   Influenza B by PCR NEGATIVE NEGATIVE Final    Comment: (NOTE) The Xpert Xpress  SARS-CoV-2/FLU/RSV plus assay is intended as an aid in the diagnosis of influenza from Nasopharyngeal swab specimens and should not be used as a sole basis for treatment. Nasal washings and aspirates are unacceptable for Xpert Xpress SARS-CoV-2/FLU/RSV testing.  Fact Sheet for Patients: EntrepreneurPulse.com.au  Fact Sheet for Healthcare Providers: IncredibleEmployment.be  This test is not yet approved or cleared by the [place] FDA and has been authorized for detection and/or diagnosis of SARS-CoV-2 by FDA under an Emergency Use Authorization (EUA). This EUA will remain in effect (meaning this test can be used) for the duration of the COVID-19 declaration under Section 564(b)(1) of the Act, 21 U.S.C. section 360bbb-3(b)(1), unless the authorization is terminated or revoked.  Performed at [facility]  [facility], [place] [address] [place], [place] [address]   Culture, blood (single)     Status: None   Collection Time: 12/10/20  8:00 PM   Specimen: BLOOD  Result Value Ref Range Status   Specimen Description BLOOD RIGHT ANTECUBITAL  Final   Special Requests   Final    BOTTLES DRAWN AEROBIC AND ANAEROBIC Blood Culture adequate volume   Culture   Final    NO GROWTH 5 DAYS Performed at [facility], [place] [address] [place], [place] [address]    Report Status 12/15/2020 FINAL  Final     Time coordinating discharge: Over 30 minutes  SIGNED:   Lenzie Sandler J [place] DO  [facility] 12/15/2020, 12:03 PM

## 2020-12-15 NOTE — Procedures (Signed)
  Procedure: CT bone marrow biopsy   EBL:   minimal Complications:  none immediate  See full dictation in [facility] PACS.  D. Harshita Bernales MD Main # [phone] Pager  [phone] Mobile [phone]     

## 2020-12-15 NOTE — Discharge Instructions (Signed)
Pancytopenia Pancytopenia is a condition in which a person has an abnormally low amount (deficiency) of the following blood cells:  Red blood cells (RBCs). Having too few RBCs is called anemia.  White blood cells (WBCs). Having too few WBCs is called leukopenia.  Cells that help the blood clot (platelets). Having too few platelets is called thrombocytopenia. Cells that become blood cells (stem cells) are made in the soft tissue inside the bones (bone marrow). All blood cells have a limited lifespan. Blood cells are constantly replaced with new blood cells from the bone marrow. Pancytopenia can be caused by any condition or disease that:  Destroys the ability of bone marrow to make blood cells.  Causes bone marrow to make blood cells that cannot survive after they leave the bone marrow. What are the causes? There are many possible causes of this condition. In some cases, the cause is not known. Common causes of the condition include:  A disease that causes bone marrow to make immature blood cells (megaloblastic anemia).  A blood disorder that makes bone marrow unable to produce enough new RBCs (aplastic anemia or bone marrow failure).  An enlarged spleen (hypersplenism). An enlarged spleen can trap blood cells and destroy them faster than they can be replaced.  Inherited diseases of the blood or bone marrow.  Cancers that affect bone marrow.  Certain medicines, such as: ? Chemotherapy. ? Medicines that reduce the activity of the immune system (immunosuppressant medicines).  Exposure to radiation.  Severe infections. What increases the risk? You are more likely to develop this condition if:  You are 79?73 years old.  You are male.  You have a family history of a blood or bone marrow disease.  You have certain conditions, such as: ? Alcohol use disorder. ? HIV (human immunodeficiency virus) or AIDS (acquired immunodeficiency syndrome). ? Cancer. ? Conditions in which the  body's disease-fighting system attacks normal tissues (autoimmune diseases). What are the signs or symptoms? Symptoms of this condition vary depending on the cause and may include:  Anemia.  Weakness.  Shortness of breath.  Unusual bruising and bleeding.  Frequent infections.  Fatigue.  Fever.  Pale skin.  Bone pain.  Night sweats.  Weight loss.  Headache.  Dizziness.  Feeling unusually cold. How is this diagnosed? This condition may be diagnosed based on:  Your symptoms.  Your medical history.  A physical exam.  Tests. These may include: ? Removal of a sample of bone marrow to be examined under a microscope (biopsy). This is done by inserting a needle into a bone to remove fluid and cells (aspiration). ? A complete blood count (CBC). This is a group of tests that measures characteristics of WBCs, RBCs, and platelets. ? A peripheral blood smear. This test examines your blood under a microscope to provide information about drugs and diseases that affect RBCs, WBCs, and platelets. ? Reticulocyte count. This is a test that measures the amount of new or immature RBCs (reticulocytes) that are made by your bone marrow. ? Imaging studies of your spleen or liver, such as X-rays. ? A test to measure your vitamin B12 level. ? Tests for viruses. How is this treated? Treatment for this condition depends on the cause. Treatment may include:  Immunosuppressant medicines.  Antibiotic medicine.  Vitamin B12. This may be given as a treatment for megaloblastic anemia.  Medicines that help the bone marrow make blood cells (bone marrow stimulating drugs).  A bone marrow transplant.  Receiving donated blood through an IV (  blood transfusion).  A procedure to remove your spleen (splenectomy). This may be done as a treatment for hypersplenism. Follow these instructions at home: Caring for your body      Wash your hands often with soap and water. If soap and water are not  available, use hand sanitizer.  Brush your teeth twice a day, and floss at least once a day. It is recommended that you visit the dentist every 6 months.  Stay up to date on your vaccinations, including a yearly (annual) flu shot. Ask your health care provider which vaccines you should get. These may include a pneumonia vaccine. Medicines  Take over-the-counter and prescription medicines only as told by your health care provider.  If you were prescribed an antibiotic medicine, take it as told by your health care provider. Do not stop taking the antibiotic even if you start to feel better. Lifestyle  Do not participate in contact sports or dangerous activities. Ask your health care provider what activities are safe for you.  During cold and flu season, avoid crowded places and avoid contact with people who are sick. Flu season is typically between the months of October and May. General instructions  Work with your health care provider to manage your condition and educate yourself about your condition.  Follow food safety recommendations as told by your health care provider.  Keep all follow-up visits as told by your health care provider. This is important. Contact a health care provider if you:  Have a fever.  Bruise or bleed easily.  Are dizzy.  Feel unusually weak or tired. Get help right away if you have:  Bleeding that does not stop.  Wheezing or shortness of breath.  Chest pain. These symptoms may represent a serious problem that is an emergency. Do not wait to see if the symptoms will go away. Get medical help right away. Call your local emergency services (911 in the [place] Do not drive yourself to the hospital. Summary  Pancytopenia is a condition in which a person has an abnormally low amount of red blood cells, white blood cells, and platelets.  There are many possible causes of this condition.  Treatment for this condition depends on the cause.  Stay up to  date on your vaccinations.  Do not participate in contact sports or dangerous activities. Ask your health care provider what activities are safe for you. This information is not intended to replace advice given to you by your health care provider. Make sure you discuss any questions you have with your health care provider. Document Revised: 09/03/2018 Document Reviewed: 09/03/2018 Elsevier Patient Education  [place].  Thrombocytopenia Thrombocytopenia means that you have a low number of platelets in your blood. Platelets are tiny cells in the blood. When you bleed, they clump together at the cut or injury to stop the bleeding. This is called blood clotting. If you do not have enough platelets, it can cause bleeding problems. Some cases of this condition are mild while others are more severe. What are the causes? This condition may be caused by:  Your body not making enough platelets. This may be caused by: ? Your bone marrow not making blood cells (aplastic anemia). ? Cancer in the bone marrow. ? Certain medicines. ? Infection in the bone marrow. ? Drinking a lot of alcohol.  Your body destroying platelets too quickly. This may be caused by: ? Certain immune diseases. ? Certain medicines. ? Certain blood clotting disorders. ? Certain disorders that are  passed from parent to child (inherited). ? Certain bleeding disorders. ? Pregnancy. ? Having a spleen that is larger than normal. What are the signs or symptoms?  Bleeding that is not normal.  Nosebleeds.  Heavy menstrual periods.  Blood in the pee (urine) or poop (stool).  A purple-like color to the skin (purpura).  Bruising.  A rash that looks like pinpoint, purple-red spots (petechiae). How is this treated?  Treatment of another condition that is causing the low platelet count.  Medicines to help protect your platelets from being destroyed.  A replacement (transfusion) of platelets to stop or prevent  bleeding.  Surgery to remove the spleen. Follow these instructions at home: Activity  Avoid activities that could cause you to get hurt or bruised. Follow instructions about how to prevent falls.  Take care not to cut yourself: ? When you shave. ? When you use scissors, needles, knives, or other tools.  Take care not to burn yourself: ? When you use an iron. ? When you cook. General instructions   Check your skin and the inside of your mouth for bruises or blood as told by your doctor.  Check to see if there is blood in your spit (sputum), pee, and poop. Do this as told by your doctor.  Do not drink alcohol.  Take over-the-counter and prescription medicines only as told by your doctor.  Do not take any medicines that have aspirin or NSAIDs in them. These medicines can thin your blood and cause you to bleed.  Tell all of your doctors that you have this condition. Be sure to tell your dentist and eye doctor too. Contact a doctor if:  You have bruises and you do not know why. Get help right away if:  You are bleeding anywhere on your body.  You have blood in your spit, pee, or poop. Summary  Thrombocytopenia means that you have a low number of platelets in your blood.  Platelets are needed for blood clotting.  Symptoms of this condition include bleeding that is not normal, and bruising.  Take care not to cut or burn yourself. This information is not intended to replace advice given to you by your health care provider. Make sure you discuss any questions you have with your health care provider. Document Revised: 08/30/2018 Document Reviewed: 08/30/2018 Elsevier Patient Education  2020 [employer].

## 2020-12-16 ENCOUNTER — Telehealth: Payer: Self-pay | Admitting: Hematology & Oncology

## 2020-12-16 NOTE — Telephone Encounter (Signed)
Called patient's wife and advised her that Christean Leaf, RN would be in contact by end of day tomorrow to schedule his appointment for next week.  She voiced complete understanding of this information per 1/5 staff message

## 2020-12-17 LAB — SURGICAL PATHOLOGY

## 2020-12-18 DIAGNOSIS — G4733 Obstructive sleep apnea (adult) (pediatric): Secondary | ICD-10-CM | POA: Diagnosis not present

## 2020-12-21 ENCOUNTER — Encounter (HOSPITAL_COMMUNITY): Payer: Self-pay | Admitting: Hematology & Oncology

## 2020-12-22 ENCOUNTER — Other Ambulatory Visit: Payer: Self-pay

## 2020-12-22 ENCOUNTER — Inpatient Hospital Stay (HOSPITAL_BASED_OUTPATIENT_CLINIC_OR_DEPARTMENT_OTHER): Payer: PPO | Admitting: Hematology & Oncology

## 2020-12-22 ENCOUNTER — Encounter: Payer: Self-pay | Admitting: Hematology & Oncology

## 2020-12-22 ENCOUNTER — Inpatient Hospital Stay: Payer: PPO | Attending: Hematology & Oncology

## 2020-12-22 VITALS — BP 131/69 | HR 65 | Temp 98.4°F | Resp 18 | Wt 222.0 lb

## 2020-12-22 DIAGNOSIS — K5730 Diverticulosis of large intestine without perforation or abscess without bleeding: Secondary | ICD-10-CM | POA: Insufficient documentation

## 2020-12-22 DIAGNOSIS — R21 Rash and other nonspecific skin eruption: Secondary | ICD-10-CM | POA: Diagnosis not present

## 2020-12-22 DIAGNOSIS — D4622 Refractory anemia with excess of blasts 2: Secondary | ICD-10-CM | POA: Diagnosis not present

## 2020-12-22 DIAGNOSIS — D61818 Other pancytopenia: Secondary | ICD-10-CM

## 2020-12-22 DIAGNOSIS — Z5111 Encounter for antineoplastic chemotherapy: Secondary | ICD-10-CM | POA: Diagnosis not present

## 2020-12-22 DIAGNOSIS — Z7189 Other specified counseling: Secondary | ICD-10-CM | POA: Diagnosis not present

## 2020-12-22 DIAGNOSIS — Z79899 Other long term (current) drug therapy: Secondary | ICD-10-CM | POA: Diagnosis not present

## 2020-12-22 DIAGNOSIS — D46Z Other myelodysplastic syndromes: Secondary | ICD-10-CM

## 2020-12-22 HISTORY — DX: Other myelodysplastic syndromes: D46.Z

## 2020-12-22 HISTORY — DX: Other specified counseling: Z71.89

## 2020-12-22 LAB — RETICULOCYTES
Immature Retic Fract: 9.8 % (ref 2.3–15.9)
RBC.: 2.99 MIL/uL — ABNORMAL LOW (ref 4.22–5.81)
Retic Count, Absolute: 51.7 10*3/uL (ref 19.0–186.0)
Retic Ct Pct: 1.7 % (ref 0.4–3.1)

## 2020-12-22 LAB — CMP (CANCER CENTER ONLY)
ALT: 19 U/L (ref 0–44)
AST: 31 U/L (ref 15–41)
Albumin: 4.7 g/dL (ref 3.5–5.0)
Alkaline Phosphatase: 35 U/L — ABNORMAL LOW (ref 38–126)
Anion gap: 8 (ref 5–15)
BUN: 20 mg/dL (ref 8–23)
CO2: 23 mmol/L (ref 22–32)
Calcium: 9.6 mg/dL (ref 8.9–10.3)
Chloride: 100 mmol/L (ref 98–111)
Creatinine: 1.34 mg/dL — ABNORMAL HIGH (ref 0.61–1.24)
GFR, Estimated: 56 mL/min — ABNORMAL LOW (ref >60)
Glucose, Bld: 99 mg/dL (ref 70–99)
Potassium: 4.4 mmol/L (ref 3.5–5.1)
Sodium: 131 mmol/L — ABNORMAL LOW (ref 135–145)
Total Bilirubin: 0.8 mg/dL (ref 0.3–1.2)
Total Protein: 8.5 g/dL — ABNORMAL HIGH (ref 6.5–8.1)

## 2020-12-22 LAB — CBC WITH DIFFERENTIAL (CANCER CENTER ONLY)
Abs Immature Granulocytes: 0.01 10*3/uL (ref 0.00–0.07)
Basophils Absolute: 0 10*3/uL (ref 0.0–0.1)
Basophils Relative: 1 %
Eosinophils Absolute: 0 10*3/uL (ref 0.0–0.5)
Eosinophils Relative: 1 %
HCT: 29.1 % — ABNORMAL LOW (ref 39.0–52.0)
Hemoglobin: 9.5 g/dL — ABNORMAL LOW (ref 13.0–17.0)
Immature Granulocytes: 1 %
Lymphocytes Relative: 37 %
Lymphs Abs: 0.6 10*3/uL — ABNORMAL LOW (ref 0.7–4.0)
MCH: 32.1 pg (ref 26.0–34.0)
MCHC: 32.6 g/dL (ref 30.0–36.0)
MCV: 98.3 fL (ref 80.0–100.0)
Monocytes Absolute: 0.2 10*3/uL (ref 0.1–1.0)
Monocytes Relative: 12 %
Neutro Abs: 0.8 10*3/uL — ABNORMAL LOW (ref 1.7–7.7)
Neutrophils Relative %: 48 %
Platelet Count: 22 10*3/uL — ABNORMAL LOW (ref 150–400)
RBC: 2.96 MIL/uL — ABNORMAL LOW (ref 4.22–5.81)
RDW: 17.5 % — ABNORMAL HIGH (ref 11.5–15.5)
WBC Count: 1.7 10*3/uL — ABNORMAL LOW (ref 4.0–10.5)
nRBC: 0 % (ref 0.0–0.2)

## 2020-12-22 LAB — LACTATE DEHYDROGENASE: LDH: 152 U/L (ref 98–192)

## 2020-12-22 LAB — VITAMIN B12: Vitamin B-12: 457 pg/mL (ref 180–914)

## 2020-12-22 LAB — SAMPLE TO BLOOD BANK

## 2020-12-22 NOTE — Progress Notes (Signed)
Hematology and Oncology Follow Up Visit  Theodore Jenkins [id number] [birth year] 75 y.o. 12/22/2020   Principle Diagnosis:   MDS -- RAEB-2  --  Normal cytogenetics;  IPSS = 5  Current Therapy:    Vidaza 75 mg/m2 sq q day d1-7  -- start cycle #1 on 12/28/2020     Interim History:  Theodore Jenkins is in for his first office visit.  I saw him in consultation at [facility] on December 30.  He came in with marked pancytopenia.  Is studies that were done in the hospital were pretty much unremarkable.  We did CT scans on him.  This was done on 12/11/2020.  Everything looked fine.  There is no adenopathy.  There is no splenomegaly.  He has some colonic diverticulosis.  There is small 4 mm or less in size lung nodules which were felt to be benign.  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  He ultimately had a bone marrow biopsy done.  This was done on 12/15/2020.  The pathology report ([id number]) showed a hypercellular marrow with myelodysplasia in excess of blasts.  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  He had 13% blasts that were noted.  We did do cytogenetics.  He had normal chromosomal complements.  We did a FISH panel.  This was also unremarkable.  His IPSS score is 5.  He comes in with his wife.  He is in good shape.  He has had some bruising but no bleeding.  His appetite is good.  He has had no problems with chest pain.  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  He is not on anticoagulation.  He has had no change in bowel or bladder habits.  He has had no cough.  He has had no rashes.  There is been no leg swelling.  He has had no headache.  Currently, his performance status is ECOG 0.  Medications:  Current Outpatient Medications:  .  acetaminophen (TYLENOL) 500 MG tablet, Take 1,000 mg by mouth every 8 (eight) hours as needed for mild pain (or headaches)., Disp: , Rfl:  .  amoxicillin (AMOXIL) 500 MG capsule, Take  2,000 mg by mouth See admin instructions. Take 2,000 mg by mouth one hour prior to dental appointments, Disp: , Rfl:  .  ascorbic acid (VITAMIN C) 500 MG tablet, Take 500 mg by mouth daily with breakfast., Disp: , Rfl:  .  ferrous sulfate 325 (65 FE) MG tablet, Take 325 mg by mouth daily with breakfast., Disp: , Rfl:  .  fluticasone (FLONASE) 50 MCG/ACT nasal spray, Place 2 sprays into both nostrils in the morning., Disp: , Rfl:  .  gabapentin (NEURONTIN) 300 MG capsule, Take 600 mg by mouth at bedtime. , Disp: , Rfl:  .  lisinopril (ZESTRIL) 5 MG tablet, TAKE 1 TABLET BY MOUTH DAILY (Patient taking differently: Take 5 mg by mouth in the morning.), Disp: 90 tablet, Rfl: 2 .  loratadine (CLARITIN) 10 MG tablet, Take 10 mg by mouth daily., Disp: , Rfl:  .  metoprolol succinate (TOPROL-XL) 50 MG 24 hr tablet, Take 50 mg by mouth at bedtime., Disp: , Rfl:  .  Multiple Vitamins-Minerals (ONE-A-DAY 50 PLUS PO), Take 1 tablet by mouth daily with breakfast., Disp: , Rfl:  .  nitroGLYCERIN (NITROSTAT) 0.4 MG SL tablet, Place 1 tablet (0.4 mg total) under the tongue every 5 (five) minutes as needed for chest pain., Disp: 25  tablet, Rfl: 6 .  ONETOUCH VERIO test strip, 1 each by Other route daily., Disp: , Rfl: 3 .  polyethylene glycol (MIRALAX / GLYCOLAX) 17 g packet, Take 17 g by mouth daily. (Patient taking differently: Take 17 g by mouth every other day.), Disp: 14 each, Rfl: 0 .  PRILOSEC OTC 20 MG tablet, Take 20 mg by mouth daily at 6 PM., Disp: , Rfl:  .  rosuvastatin (CRESTOR) 10 MG tablet, Take 10 mg by mouth at bedtime., Disp: , Rfl:  .  sitaGLIPtin-metformin (JANUMET) 50-1000 MG tablet, Take 1 tablet by mouth 2 (two) times daily with a meal. , Disp: , Rfl:   Allergies:  Allergies  Allergen Reactions  . Oxycodone Nausea And Vomiting    Past Medical History, Surgical history, Social history, and Family History were reviewed and updated.  Review of Systems: Review of Systems  Constitutional:  Negative.   HENT:  Negative.   Eyes: Negative.   Respiratory: Negative.   Cardiovascular: Negative.   Gastrointestinal: Negative.   Endocrine: Negative.   Genitourinary: Negative.    Musculoskeletal: Negative.   Skin: Positive for rash.  Neurological: Negative.   Hematological: Bruises/bleeds easily.  Psychiatric/Behavioral: Negative.     Physical Exam:  weight is 222 lb (100.7 kg). His oral temperature is 98.4 F (36.9 C). His blood pressure is 131/69 and his pulse is 65. His respiration is 18 and oxygen saturation is 100%.   Wt Readings from Last 3 Encounters:  12/22/20 222 lb (100.7 kg)  12/11/20 220 lb 14.4 oz (100.2 kg)  12/09/20 225 lb (102.1 kg)    Physical Exam Vitals reviewed.  HENT:     Head: Normocephalic and atraumatic.     Mouth/Throat:     Mouth: Oropharynx is clear and moist.  Eyes:     Extraocular Movements: EOM normal.     Pupils: Pupils are equal, round, and reactive to light.  Cardiovascular:     Rate and Rhythm: Normal rate and regular rhythm.     Heart sounds: Normal heart sounds.  Pulmonary:     Effort: Pulmonary effort is normal.     Breath sounds: Normal breath sounds.  Abdominal:     General: Bowel sounds are normal.     Palpations: Abdomen is soft.  Musculoskeletal:        General: No tenderness, deformity or edema. Normal range of motion.     Cervical back: Normal range of motion.  Lymphadenopathy:     Cervical: No cervical adenopathy.  Skin:    General: Skin is warm and dry.     Findings: No erythema or rash.     Comments: Skin exam does show some scattered ecchymoses.  I do not see any obvious petechia on his legs.  He has no rashes.  Neurological:     Mental Status: He is alert and oriented to person, place, and time.  Psychiatric:        Mood and Affect: Mood and affect normal.        Behavior: Behavior normal.        Thought Content: Thought content normal.        Judgment: Judgment normal.      Lab Results  Component  Value Date   WBC 1.7 (L) 12/22/2020   HGB 9.5 (L) 12/22/2020   HCT 29.1 (L) 12/22/2020   MCV 98.3 12/22/2020   PLT 22 (L) 12/22/2020     Chemistry      Component Value Date/Time   NA  131 (L) 12/22/2020 1150   NA 131 (L) 12/09/2020 1629   K 4.4 12/22/2020 1150   CL 100 12/22/2020 1150   CO2 23 12/22/2020 1150   BUN 20 12/22/2020 1150   BUN 24 12/09/2020 1629   CREATININE 1.34 (H) 12/22/2020 1150      Component Value Date/Time   CALCIUM 9.6 12/22/2020 1150   ALKPHOS 35 (L) 12/22/2020 1150   AST 31 12/22/2020 1150   ALT 19 12/22/2020 1150   BILITOT 0.8 12/22/2020 1150      Impression and Plan: Mr. Kinzler is a very nice 75 year old white male.  He has high-grade myelodysplasia.  This clearly has been present for only about 7 months.  He had lab work done back in May 2021 which looked pretty normal.  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  I am surprised that his chromosomes and FISH panel were normal.  I am sending off next generation sequencing on his peripheral blood.  Maybe we can get some additional information from this.  He would be a good candidate for HMA therapy.  I think he would be a good candidate for Vidaza.  As such, I talked to he and his wife about Vidaza.  I think that this should have a decent chance of improving his situation and getting his blood counts better.  Actually, he may be a candidate for stem cell transplantation.  I realize this would be an allogeneic transplant.  He is at the borderline for age although he is in pretty good health.  We will have to keep this in mind.  He will need to have a Port-A-Cath placed.  I think he will need this because of transfusions that he likely will require.  He does not have all that good of peripheral access.  As such, I think that we really should consider the Port-A-Cath.  I spoke to he and his wife about this.  They agree.  I told him that it would take a good 3 months or so before we start to see  any response.  Because of this, we just need to be patient.  We will have to check his labs weekly.  Hopefully, we will start to see the blood counts improve and we can then start to pull back a little bit on lab work.  I spent a good 45 minutes or so with he and his wife.  I gave him information about the Vidaza.  I answered all of their questions.  I will plan to see him back when he starts his second cycle of Vidaza in February.   Volanda Napoleon, MD 1/11/20222:15 PM

## 2020-12-22 NOTE — Progress Notes (Signed)
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

## 2020-12-23 LAB — IRON AND TIBC
Iron: 216 ug/dL — ABNORMAL HIGH (ref 42–163)
Saturation Ratios: 67 % — ABNORMAL HIGH (ref 20–55)
TIBC: 324 ug/dL (ref 202–409)
UIBC: 108 ug/dL — ABNORMAL LOW (ref 117–376)

## 2020-12-23 LAB — ERYTHROPOIETIN: Erythropoietin: 27 m[IU]/mL — ABNORMAL HIGH (ref 2.6–18.5)

## 2020-12-23 LAB — FERRITIN: Ferritin: 121 ng/mL (ref 24–336)

## 2020-12-23 NOTE — Progress Notes (Signed)
Pharmacist Chemotherapy Monitoring - Initial Assessment    Anticipated start date: 12/29/20  Regimen:  . Are orders appropriate based on the patient's diagnosis, regimen, and cycle? Yes . Does the plan date match the patient's scheduled date? Yes . Is the sequencing of drugs appropriate? Yes . Are the premedications appropriate for the patient's regimen? Yes . Prior Authorization for treatment is: Approved o If applicable, is the correct biosimilar selected based on the patient's insurance? not applicable  Organ Function and Labs: [name] Are dose adjustments needed based on the patient's renal function, hepatic function, or hematologic function? No . Are appropriate labs ordered prior to the start of patient's treatment? Yes . Other organ system assessment, if indicated: N/A . The following baseline labs, if indicated, have been ordered: N/A  Dose Assessment: . Are the drug doses appropriate? Yes . Are the following correct: o Drug concentrations Yes o IV fluid compatible with drug Yes o Administration routes Yes o Timing of therapy Yes . If applicable, does the patient have documented access for treatment and/or plans for port-a-cath placement? not applicable . If applicable, have lifetime cumulative doses been properly documented and assessed? not applicable Lifetime Dose Tracking  No doses have been documented on this patient for the following tracked chemicals: Doxorubicin, Epirubicin, Idarubicin, Daunorubicin, Mitoxantrone, Bleomycin, Oxaliplatin, Carboplatin, Liposomal Doxorubicin  o   Toxicity Monitoring/Prevention: . The patient has the following take home antiemetics prescribed: Ondansetron, Prochlorperazine and Lorazepam . The patient has the following take home medications prescribed: VZV prophylaxis . Medication allergies and previous infusion related reactions, if applicable, have been reviewed and addressed. Yes . The patient's current medication list has been assessed for  drug-drug interactions with their chemotherapy regimen. no significant drug-drug interactions were identified on review.  Order Review: . Are the treatment plan orders signed? Yes . Is the patient scheduled to see a provider prior to their treatment? No  I verify that I have reviewed each item in the above checklist and answered each question accordingly.  Chisom Muntean, Jacqlyn Larsen 12/23/2020 2:24 PM

## 2020-12-24 ENCOUNTER — Other Ambulatory Visit: Payer: Self-pay | Admitting: *Deleted

## 2020-12-24 ENCOUNTER — Other Ambulatory Visit: Payer: PPO

## 2020-12-24 ENCOUNTER — Ambulatory Visit: Payer: PPO | Admitting: Cardiovascular Disease

## 2020-12-24 DIAGNOSIS — D46Z Other myelodysplastic syndromes: Secondary | ICD-10-CM

## 2020-12-24 MED ORDER — PROCHLORPERAZINE MALEATE 10 MG PO TABS: 10.0000 mg | ORAL_TABLET | Freq: Four times a day (QID) | ORAL | 1 refills | Status: DC | PRN

## 2020-12-24 MED ORDER — LIDOCAINE-PRILOCAINE 2.5-2.5 % EX CREA: TOPICAL_CREAM | CUTANEOUS | 3 refills | Status: DC

## 2020-12-24 MED ORDER — ONDANSETRON HCL 8 MG PO TABS: 8.0000 mg | ORAL_TABLET | Freq: Two times a day (BID) | ORAL | 1 refills | Status: DC | PRN

## 2020-12-25 ENCOUNTER — Other Ambulatory Visit: Payer: Self-pay | Admitting: Radiology

## 2020-12-28 ENCOUNTER — Ambulatory Visit (HOSPITAL_COMMUNITY)
Admission: RE | Admit: 2020-12-28 | Discharge: 2020-12-28 | Disposition: A | Discharge status: Home / Self Care | Payer: PPO | Source: Ambulatory Visit | Attending: Hematology & Oncology | Admitting: Hematology & Oncology

## 2020-12-28 ENCOUNTER — Other Ambulatory Visit: Payer: Self-pay | Admitting: Hematology & Oncology

## 2020-12-28 ENCOUNTER — Encounter (HOSPITAL_COMMUNITY): Payer: Self-pay

## 2020-12-28 ENCOUNTER — Other Ambulatory Visit: Payer: Self-pay

## 2020-12-28 DIAGNOSIS — D46Z Other myelodysplastic syndromes: Secondary | ICD-10-CM

## 2020-12-28 DIAGNOSIS — Z452 Encounter for adjustment and management of vascular access device: Secondary | ICD-10-CM | POA: Diagnosis not present

## 2020-12-28 DIAGNOSIS — K219 Gastro-esophageal reflux disease without esophagitis: Secondary | ICD-10-CM | POA: Insufficient documentation

## 2020-12-28 DIAGNOSIS — I10 Essential (primary) hypertension: Secondary | ICD-10-CM | POA: Insufficient documentation

## 2020-12-28 DIAGNOSIS — Z7984 Long term (current) use of oral hypoglycemic drugs: Secondary | ICD-10-CM | POA: Insufficient documentation

## 2020-12-28 DIAGNOSIS — I2510 Atherosclerotic heart disease of native coronary artery without angina pectoris: Secondary | ICD-10-CM | POA: Diagnosis not present

## 2020-12-28 DIAGNOSIS — Z955 Presence of coronary angioplasty implant and graft: Secondary | ICD-10-CM | POA: Diagnosis not present

## 2020-12-28 DIAGNOSIS — E119 Type 2 diabetes mellitus without complications: Secondary | ICD-10-CM | POA: Insufficient documentation

## 2020-12-28 DIAGNOSIS — G4733 Obstructive sleep apnea (adult) (pediatric): Secondary | ICD-10-CM | POA: Insufficient documentation

## 2020-12-28 DIAGNOSIS — E785 Hyperlipidemia, unspecified: Secondary | ICD-10-CM | POA: Insufficient documentation

## 2020-12-28 DIAGNOSIS — D469 Myelodysplastic syndrome, unspecified: Secondary | ICD-10-CM | POA: Diagnosis not present

## 2020-12-28 DIAGNOSIS — Z79899 Other long term (current) drug therapy: Secondary | ICD-10-CM | POA: Diagnosis not present

## 2020-12-28 HISTORY — PX: IR IMAGING GUIDED PORT INSERTION: IMG5740

## 2020-12-28 LAB — CBC WITH DIFFERENTIAL/PLATELET
Abs Immature Granulocytes: 0.03 10*3/uL (ref 0.00–0.07)
Basophils Absolute: 0 10*3/uL (ref 0.0–0.1)
Basophils Relative: 0 %
Eosinophils Absolute: 0 10*3/uL (ref 0.0–0.5)
Eosinophils Relative: 0 %
HCT: 30 % — ABNORMAL LOW (ref 39.0–52.0)
Hemoglobin: 9.7 g/dL — ABNORMAL LOW (ref 13.0–17.0)
Immature Granulocytes: 1 %
Lymphocytes Relative: 35 %
Lymphs Abs: 0.8 10*3/uL (ref 0.7–4.0)
MCH: 32.3 pg (ref 26.0–34.0)
MCHC: 32.3 g/dL (ref 30.0–36.0)
MCV: 100 fL (ref 80.0–100.0)
Monocytes Absolute: 0.2 10*3/uL (ref 0.1–1.0)
Monocytes Relative: 8 %
Neutro Abs: 1.3 10*3/uL — ABNORMAL LOW (ref 1.7–7.7)
Neutrophils Relative %: 56 %
Platelets: 37 10*3/uL — ABNORMAL LOW (ref 150–400)
RBC: 3 MIL/uL — ABNORMAL LOW (ref 4.22–5.81)
RDW: 17.9 % — ABNORMAL HIGH (ref 11.5–15.5)
WBC: 2.4 10*3/uL — ABNORMAL LOW (ref 4.0–10.5)
nRBC: 0 % (ref 0.0–0.2)

## 2020-12-28 LAB — GLUCOSE, CAPILLARY: Glucose-Capillary: 112 mg/dL — ABNORMAL HIGH (ref 70–99)

## 2020-12-28 MED ORDER — CEFAZOLIN SODIUM-DEXTROSE 2-4 GM/100ML-% IV SOLN: 2.0000 g | INTRAVENOUS | Status: AC

## 2020-12-28 MED ORDER — MIDAZOLAM HCL 2 MG/2ML IJ SOLN
INTRAMUSCULAR | Status: AC
  Filled 2020-12-28: qty 4

## 2020-12-28 MED ORDER — FENTANYL CITRATE (PF) 100 MCG/2ML IJ SOLN
INTRAMUSCULAR | Status: AC
  Filled 2020-12-28: qty 2

## 2020-12-28 MED ORDER — LIDOCAINE-EPINEPHRINE 1 %-1:100000 IJ SOLN
INTRAMUSCULAR | Status: AC
  Filled 2020-12-28: qty 1

## 2020-12-28 MED ORDER — HEPARIN SOD (PORK) LOCK FLUSH 100 UNIT/ML IV SOLN
INTRAVENOUS | Status: AC
  Filled 2020-12-28: qty 5

## 2020-12-28 MED ORDER — SODIUM CHLORIDE 0.9 % IV SOLN: INTRAVENOUS | Status: DC

## 2020-12-28 MED ORDER — CEFAZOLIN SODIUM-DEXTROSE 2-4 GM/100ML-% IV SOLN
INTRAVENOUS | Status: AC
  Administered 2020-12-28: 2 g via INTRAVENOUS
  Filled 2020-12-28: qty 100

## 2020-12-28 MED ORDER — MIDAZOLAM HCL 2 MG/2ML IJ SOLN
INTRAMUSCULAR | Status: AC | PRN
  Administered 2020-12-28 (×2): 1 mg via INTRAVENOUS

## 2020-12-28 MED ORDER — LIDOCAINE-EPINEPHRINE 1 %-1:100000 IJ SOLN
INTRAMUSCULAR | Status: AC | PRN
  Administered 2020-12-28: 10 mL

## 2020-12-28 MED ORDER — FENTANYL CITRATE (PF) 100 MCG/2ML IJ SOLN
INTRAMUSCULAR | Status: AC | PRN
  Administered 2020-12-28 (×2): 50 ug via INTRAVENOUS

## 2020-12-28 NOTE — H&P (Signed)
Referring Physician(s): Ennever,Peter R  Supervising Physician: Sandi Mariscal  Patient Status:  [facility] OP  Chief Complaint:  "I'm getting a port a cath"  Subjective Patient familiar to IR service from bone marrow biopsy on 12/15/2020.  He has a history of pancytopenia and high-grade myelodysplasia (concern for progression to AML) along with poor venous access.  He presents today for Port-A-Cath placement to assist with treatment and anticipated transfusions.  He currently denies fever,, chest pain, dyspnea, cough, abdominal pain, nausea, vomiting or bleeding.  He does have back pain.  Additional medical history as below.  Past Medical History:  Diagnosis Date  . Aortic stenosis    Echo (08/2017): Severe AS with mean gradient of 42 mmHg.  . Barrett's esophagus   . CAD (coronary artery disease) 06/06/2011   Remote stent to the LAD in 2005 // Last cath in 2010 showing diffuse CAD, small PD that is occluded and fills with left to right collaterals, 60-70% 1st OM, with normal EF. Managed medically // Myoview 3/18:  Normal perfusion. LVEF 55% with normal wall motion. This is a low risk study.  . Complication of anesthesia   . Diabetes mellitus   . ED (erectile dysfunction)   . Family history of adverse reaction to anesthesia 10/23/2017   sister had history of malignant hyperthermia when she was 4, she is 73 years old now  . GERD (gastroesophageal reflux disease)   . Goals of care, counseling/discussion 12/22/2020  . Hyperlipidemia   . Hypertension   . IHD (ischemic heart disease)    prior stenting of the LAD in December of 2005; with last cath in 2010 showing diffuse CAD with normal LV function. He is managed medically.   . Malignant hyperthermia    sister had reaction concerning for malignant hyperthermia (~ 1988)  . MDS (myelodysplastic syndrome), high grade ([place]) 12/22/2020  . Neuromuscular disorder ([place]) 10/23/2017   lumbar 4 disc in back causing nerve pain, on gabapentin  . Obesity    . Obstructive sleep apnea on CPAP   . PONV (postoperative nausea and vomiting)   . S/P aortic valve replacement with bioprosthetic valve 10/26/2017   25 mm [facility] [place] Ease bovine pericardial tissue valve  . S/P coronary artery stent placement 2005   LAD   Past Surgical History:  Procedure Laterality Date  . AORTIC VALVE REPLACEMENT N/A 10/26/2017   Procedure: AORTIC VALVE REPLACEMENT (AVR), using Magna Ease 25;  Surgeon: Rexene Alberts, MD;  Location: [place];  Service: Open Heart Surgery;  Laterality: N/A;  . CARDIAC CATHETERIZATION  2010   NORMAL LV FUNCTION. DIFFUSE CORONARY DISEASE WITH THE DISTAL PORTION OF THE SMALL POSTERIOR DESCENDING VESSEL OCCLUDED AND FILLING WITH THE RIGHT COLLATERALS, 60-70% STENOSIS IN THE FIRST OBTUSE MARGINAL VESSEL AND DIFFUSE SMALL VESSEL DISEASE  . CAROTID ENDARTERECTOMY    . CIRCUMCISION  1985  . CORONARY ARTERY BYPASS GRAFT N/A 10/26/2017   Procedure: CORONARY ARTERY BYPASS GRAFTING (CABG) x two , using left internal mammary artery and right leg greater saphenous vein harvested endoscopically;  Surgeon: Rexene Alberts, MD;  Location: [place];  Service: Open Heart Surgery;  Laterality: N/A;  . CORONARY STENT PLACEMENT  2005  . RIGHT/LEFT HEART CATH AND CORONARY ANGIOGRAPHY N/A 08/25/2017   Procedure: RIGHT/LEFT HEART CATH AND CORONARY ANGIOGRAPHY;  Surgeon: Nelva Bush, MD;  Location: [place] [facility];  Service: Cardiovascular;  Laterality: N/A;  . rotator cuff surgery Right   . TEE WITHOUT CARDIOVERSION N/A 10/26/2017   Procedure:  TRANSESOPHAGEAL ECHOCARDIOGRAM (TEE);  Surgeon: Rexene Alberts, MD;  Location: [place];  Service: Open Heart Surgery;  Laterality: N/A;  . TEE WITHOUT CARDIOVERSION N/A 05/01/2020   Procedure: TRANSESOPHAGEAL ECHOCARDIOGRAM (TEE);  Surgeon: Buford Dresser, MD;  Location: [facility] ENDOSCOPY;  Service: Cardiovascular;  Laterality: N/A;     Allergies: Oxycodone  Medications: Prior to Admission medications    Medication Sig Start Date End Date Taking? Authorizing Provider  acetaminophen (TYLENOL) 500 MG tablet Take 1,000 mg by mouth every 8 (eight) hours as needed for mild pain (or headaches).   Yes [provider]  ascorbic acid (VITAMIN C) 500 MG tablet Take 500 mg by mouth daily with breakfast.   Yes [provider]  ferrous sulfate 325 (65 FE) MG tablet Take 325 mg by mouth daily with breakfast.   Yes [provider]  fluticasone (FLONASE) 50 MCG/ACT nasal spray Place 2 sprays into both nostrils in the morning.   Yes [provider]  gabapentin (NEURONTIN) 300 MG capsule Take 600 mg by mouth at bedtime.    Yes [provider]  lisinopril (ZESTRIL) 5 MG tablet TAKE 1 TABLET BY MOUTH DAILY Patient taking differently: Take 5 mg by mouth in the morning. 09/02/20  Yes Burnell Blanks, MD  loratadine (CLARITIN) 10 MG tablet Take 10 mg by mouth daily.   Yes [provider]  metoprolol succinate (TOPROL-XL) 50 MG 24 hr tablet Take 50 mg by mouth at bedtime.   Yes [provider]  Multiple Vitamins-Minerals (ONE-A-DAY 50 PLUS PO) Take 1 tablet by mouth daily with breakfast.   Yes [provider]  polyethylene glycol (MIRALAX / GLYCOLAX) 17 g packet Take 17 g by mouth daily. Patient taking differently: Take 17 g by mouth every other day. 05/04/20  Yes Donne Hazel, MD  PRILOSEC OTC 20 MG tablet Take 20 mg by mouth daily at 6 PM.   Yes [provider]  rosuvastatin (CRESTOR) 10 MG tablet Take 10 mg by mouth at bedtime.   Yes [provider]  sitaGLIPtin-metformin (JANUMET) 50-1000 MG tablet Take 1 tablet by mouth 2 (two) times daily with a meal.  12/19/16  Yes [provider]  vitamin B-12 (CYANOCOBALAMIN) 250 MCG tablet Take 250 mcg by mouth daily.   Yes [provider]  amoxicillin (AMOXIL) 500 MG capsule Take 2,000 mg by mouth See admin instructions. Take 2,000 mg by mouth one hour prior to  dental appointments    [provider]  lidocaine-prilocaine (EMLA) cream Apply to affected area once 12/24/20   Ennever, Rudell Cobb, MD  nitroGLYCERIN (NITROSTAT) 0.4 MG SL tablet Place 1 tablet (0.4 mg total) under the tongue every 5 (five) minutes as needed for chest pain. 04/19/19 04/29/20  Burnell Blanks, MD  ondansetron (ZOFRAN) 8 MG tablet Take 1 tablet (8 mg total) by mouth 2 (two) times daily as needed (Nausea or vomiting). 12/24/20   Volanda Napoleon, MD  [facility] VERIO test strip 1 each by Other route daily. 12/29/17   [provider]  prochlorperazine (COMPAZINE) 10 MG tablet Take 1 tablet (10 mg total) by mouth every 6 (six) hours as needed (Nausea or vomiting). 12/24/20   Volanda Napoleon, MD     Vital Signs:pending   Physical Exam awake, alert.  Chest clear to auscultation bilaterally.  Heart with regular rate and rhythm.  Abdomen obese, soft, positive bowel sounds, nontender.  No lower extremity edema.  Imaging: No results found.  Labs:  CBC: Recent  Labs    12/13/20 0213 12/14/20 0053 12/15/20 0324 12/22/20 1150  WBC 2.2* 2.5* 1.9* 1.7*  HGB 9.4* 9.2* 9.3* 9.5*  HCT 28.0* 26.9* 27.4* 29.1*  PLT 24* 23* 19* 22*    COAGS: Recent Labs    12/10/20 1743  INR 1.1    BMP: Recent Labs    05/01/20 0232 05/02/20 0656 05/04/20 0248 12/09/20 1629 12/10/20 1305 12/13/20 0213 12/14/20 0053 12/15/20 0324 12/22/20 1150  NA 132* 132* 131* 131*   < > 136 136 136 131*  K 4.1 4.0 5.1 4.3   < > 4.0 3.9 4.0 4.4  CL 104 101 95* 97   < > 105 107 105 100  CO2 20* 23 26 20    < > 21* 21* 21* 23  GLUCOSE 118* 115* 121* 89   < > 111* 122* 113* 99  BUN 13 11 15 24    < > 13 15 17 20   CALCIUM 8.5* 8.9 9.1 9.3   < > 9.0 8.8* 8.8* 9.6  CREATININE 0.86 0.94 1.11 1.44*   < > 1.62* 1.44* 1.31* 1.34*  GFRNONAA >60 >60 >60 48*   < > 45* 52* 58* 56*  GFRAA >60 >60 >60 56*  --   --   --   --   --    < > = values in this interval not displayed.    LIVER FUNCTION  TESTS: Recent Labs    05/02/20 0656 05/04/20 0248 12/10/20 1305 12/22/20 1150  BILITOT 0.9 0.5 0.9 0.8  AST 32 40 32 31  ALT 36 40 20 19  ALKPHOS 62 63 30* 35*  PROT 6.9 7.3 7.5 8.5*  ALBUMIN 3.1* 3.1* 4.4 4.7    Assessment and Plan: Patient with past medical history significant for coronary artery disease with prior stenting, aortic stenosis, prior aortic valve replacement, obstructive sleep apnea, obesity, hypertension, hyperlipidemia, GERD, diabetes. He is familiar to IR service from bone marrow biopsy on 12/15/2020.  He also has a history of pancytopenia and high-grade myelodysplasia (concern for progression to AML) along with poor venous access.  He presents today for Port-A-Cath placement to assist with treatment and anticipated transfusions. Risks and benefits of image guided port-a-catheter placement was discussed with the patient including, but not limited to bleeding, infection, pneumothorax, or fibrin sheath development and need for additional procedures.  All of the patient's questions were answered, patient is agreeable to proceed. Consent signed and in chart.     Electronically Signed: D. Rowe Robert, PA-C 12/28/2020, 11:36 AM   I spent a total of 25 minutes at the the patient's bedside AND on the patient's hospital floor or unit, greater than 50% of which was counseling/coordinating care for Port-A-Cath placement

## 2020-12-28 NOTE — Procedures (Signed)
Pre Procedure Dx: Poor venous access Post Procedural Dx: Same  Successful placement of right IJ approach port-a-cath with tip at the superior caval atrial junction. The catheter is ready for immediate use.  Estimated Blood Loss: Minimal  Complications: None immediate.  Jay Jeraldin Fesler, MD Pager #: [phone]   

## 2020-12-28 NOTE — Discharge Instructions (Signed)
For questions /concerns may call Interventional Radiology at [phone]  You may remove your dressing and shower tomorrow afternoon  DO NOT use EMLA cream for 2 weeks after port placement as the cream will remove surgical glue on your incision.    Implanted Port Insertion, Care After This sheet gives you information about how to care for yourself after your procedure. Your health care provider may also give you more specific instructions. If you have problems or questions, contact your health care provider. What can I expect after the procedure? After the procedure, it is common to have:  Discomfort at the port insertion site.  Bruising on the skin over the port. This should improve over 3-4 days. Follow these instructions at home: Port care  After your port is placed, you will get a manufacturer's information card. The card has information about your port. Keep this card with you at all times.  Take care of the port as told by your health care provider. Ask your health care provider if you or a family member can get training for taking care of the port at home. A home health care nurse may also take care of the port.  Make sure to remember what type of port you have. Incision care  Follow instructions from your health care provider about how to take care of your port insertion site. Make sure you: ? Wash your hands with soap and water before and after you change your bandage (dressing). If soap and water are not available, use hand sanitizer. ? Change your dressing as told by your health care provider. ? Leave stitches (sutures), skin glue, or adhesive strips in place. These skin closures may need to stay in place for 2 weeks or longer. If adhesive strip edges start to loosen and curl up, you may trim the loose edges. Do not remove adhesive strips completely unless your health care provider tells you to do that.  Check your port insertion site every day for signs of infection. Check  for: ? Redness, swelling, or pain. ? Fluid or blood. ? Warmth. ? Pus or a bad smell.      Activity  Return to your normal activities as told by your health care provider. Ask your health care provider what activities are safe for you.  Do not lift anything that is heavier than 10 lb (4.5 kg), or the limit that you are told, until your health care provider says that it is safe. General instructions  Take over-the-counter and prescription medicines only as told by your health care provider.  Do not take baths, swim, or use a hot tub until your health care provider approves. Ask your health care provider if you may take showers. You may only be allowed to take sponge baths.  Do not drive for 24 hours if you were given a sedative during your procedure.  Wear a medical alert bracelet in case of an emergency. This will tell any health care providers that you have a port.  Keep all follow-up visits as told by your health care provider. This is important. Contact a health care provider if:  You cannot flush your port with saline as directed, or you cannot draw blood from the port.  You have a fever or chills.  You have redness, swelling, or pain around your port insertion site.  You have fluid or blood coming from your port insertion site.  Your port insertion site feels warm to the touch.  You have pus or a   bad smell coming from the port insertion site. Get help right away if:  You have chest pain or shortness of breath.  You have bleeding from your port that you cannot control. Summary  Take care of the port as told by your health care provider. Keep the manufacturer's information card with you at all times.  Change your dressing as told by your health care provider.  Contact a health care provider if you have a fever or chills or if you have redness, swelling, or pain around your port insertion site.  Keep all follow-up visits as told by your health care provider. This  information is not intended to replace advice given to you by your health care provider. Make sure you discuss any questions you have with your health care provider.   Moderate Conscious Sedation, Adult, Care After This sheet gives you information about how to care for yourself after your procedure. Your health care provider may also give you more specific instructions. If you have problems or questions, contact your health care provider. What can I expect after the procedure? After the procedure, it is common to have:  Sleepiness for several hours.  Impaired judgment for several hours.  Difficulty with balance.  Vomiting if you eat too soon. Follow these instructions at home: For the time period you were told by your health care provider:  Rest.  Do not participate in activities where you could fall or become injured.  Do not drive or use machinery.  Do not drink alcohol.  Do not take sleeping pills or medicines that cause drowsiness.  Do not make important decisions or sign legal documents.  Do not take care of children on your own.      Eating and drinking  Follow the diet recommended by your health care provider.  Drink enough fluid to keep your urine pale yellow.  If you vomit: ? Drink water, juice, or soup when you can drink without vomiting. ? Make sure you have little or no nausea before eating solid foods.   General instructions  Take over-the-counter and prescription medicines only as told by your health care provider.  Have a responsible adult stay with you for the time you are told. It is important to have someone help care for you until you are awake and alert.  Do not smoke.  Keep all follow-up visits as told by your health care provider. This is important. Contact a health care provider if:  You are still sleepy or having trouble with balance after 24 hours.  You feel light-headed.  You keep feeling nauseous or you keep vomiting.  You develop  a rash.  You have a fever.  You have redness or swelling around the IV site. Get help right away if:  You have trouble breathing.  You have new-onset confusion at home. Summary  After the procedure, it is common to feel sleepy, have impaired judgment, or feel nauseous if you eat too soon.  Rest after you get home. Know the things you should not do after the procedure.  Follow the diet recommended by your health care provider and drink enough fluid to keep your urine pale yellow.  Get help right away if you have trouble breathing or new-onset confusion at home. This information is not intended to replace advice given to you by your health care provider. Make sure you discuss any questions you have with your health care provider. Document Revised: 03/27/2020 Document Reviewed: 10/24/2019 Elsevier Patient Education  2021 Elsevier   Inc.  

## 2020-12-29 ENCOUNTER — Other Ambulatory Visit: Payer: PPO

## 2020-12-29 ENCOUNTER — Encounter: Payer: Self-pay | Admitting: *Deleted

## 2020-12-29 ENCOUNTER — Inpatient Hospital Stay: Payer: PPO

## 2020-12-29 VITALS — BP 135/81 | HR 72 | Temp 98.2°F | Resp 16

## 2020-12-29 DIAGNOSIS — Z5111 Encounter for antineoplastic chemotherapy: Secondary | ICD-10-CM | POA: Diagnosis not present

## 2020-12-29 DIAGNOSIS — D46Z Other myelodysplastic syndromes: Secondary | ICD-10-CM

## 2020-12-29 LAB — CBC WITH DIFFERENTIAL (CANCER CENTER ONLY)
Abs Immature Granulocytes: 0.01 10*3/uL (ref 0.00–0.07)
Basophils Absolute: 0 10*3/uL (ref 0.0–0.1)
Basophils Relative: 0 %
Eosinophils Absolute: 0 10*3/uL (ref 0.0–0.5)
Eosinophils Relative: 1 %
HCT: 25.9 % — ABNORMAL LOW (ref 39.0–52.0)
Hemoglobin: 8.4 g/dL — ABNORMAL LOW (ref 13.0–17.0)
Immature Granulocytes: 1 %
Lymphocytes Relative: 34 %
Lymphs Abs: 0.6 10*3/uL — ABNORMAL LOW (ref 0.7–4.0)
MCH: 32.1 pg (ref 26.0–34.0)
MCHC: 32.4 g/dL (ref 30.0–36.0)
MCV: 98.9 fL (ref 80.0–100.0)
Monocytes Absolute: 0.2 10*3/uL (ref 0.1–1.0)
Monocytes Relative: 9 %
Neutro Abs: 0.9 10*3/uL — ABNORMAL LOW (ref 1.7–7.7)
Neutrophils Relative %: 55 %
Platelet Count: 31 10*3/uL — ABNORMAL LOW (ref 150–400)
RBC: 2.62 MIL/uL — ABNORMAL LOW (ref 4.22–5.81)
RDW: 17.8 % — ABNORMAL HIGH (ref 11.5–15.5)
WBC Count: 1.6 10*3/uL — ABNORMAL LOW (ref 4.0–10.5)
nRBC: 0 % (ref 0.0–0.2)

## 2020-12-29 LAB — BASIC METABOLIC PANEL - CANCER CENTER ONLY
Anion gap: 7 (ref 5–15)
BUN: 20 mg/dL (ref 8–23)
CO2: 23 mmol/L (ref 22–32)
Calcium: 9.7 mg/dL (ref 8.9–10.3)
Chloride: 102 mmol/L (ref 98–111)
Creatinine: 1.37 mg/dL — ABNORMAL HIGH (ref 0.61–1.24)
GFR, Estimated: 55 mL/min — ABNORMAL LOW (ref >60)
Glucose, Bld: 140 mg/dL — ABNORMAL HIGH (ref 70–99)
Potassium: 4.2 mmol/L (ref 3.5–5.1)
Sodium: 132 mmol/L — ABNORMAL LOW (ref 135–145)

## 2020-12-29 LAB — MYELODYSPLASTIC SYNDROME (MDS) PANEL

## 2020-12-29 MED ORDER — ONDANSETRON HCL 8 MG PO TABS
ORAL_TABLET | ORAL | Status: AC
  Filled 2020-12-29: qty 1

## 2020-12-29 MED ORDER — HEPARIN SOD (PORK) LOCK FLUSH 100 UNIT/ML IV SOLN
500.0000 [IU] | Freq: Once | INTRAVENOUS | Status: AC
  Administered 2020-12-29: 500 [IU] via INTRAVENOUS
  Filled 2020-12-29: qty 5

## 2020-12-29 MED ORDER — AZACITIDINE CHEMO SQ INJECTION
75.0000 mg/m2 | Freq: Once | INTRAMUSCULAR | Status: AC
  Administered 2020-12-29: 165 mg via SUBCUTANEOUS
  Filled 2020-12-29: qty 6.6

## 2020-12-29 MED ORDER — HEPARIN SOD (PORK) LOCK FLUSH 10 UNIT/ML IV SOLN: 10.0000 [IU] | Freq: Once | INTRAVENOUS | Status: DC

## 2020-12-29 MED ORDER — ONDANSETRON HCL 8 MG PO TABS
8.0000 mg | ORAL_TABLET | Freq: Once | ORAL | Status: AC
Start: 2020-12-29 — End: 2020-12-29
  Administered 2020-12-29: 8 mg via ORAL

## 2020-12-29 NOTE — Patient Instructions (Signed)
Implanted Port Insertion, Care After This sheet gives you information about how to care for yourself after your procedure. Your health care provider may also give you more specific instructions. If you have problems or questions, contact your health care provider. What can I expect after the procedure? After the procedure, it is common to have:  Discomfort at the port insertion site.  Bruising on the skin over the port. This should improve over 3-4 days. Follow these instructions at home: Port care  After your port is placed, you will get a manufacturer's information card. The card has information about your port. Keep this card with you at all times.  Take care of the port as told by your health care provider. Ask your health care provider if you or a family member can get training for taking care of the port at home. A home health care nurse may also take care of the port.  Make sure to remember what type of port you have. Incision care  Follow instructions from your health care provider about how to take care of your port insertion site. Make sure you: ? Wash your hands with soap and water before and after you change your bandage (dressing). If soap and water are not available, use hand sanitizer. ? Change your dressing as told by your health care provider. ? Leave stitches (sutures), skin glue, or adhesive strips in place. These skin closures may need to stay in place for 2 weeks or longer. If adhesive strip edges start to loosen and curl up, you may trim the loose edges. Do not remove adhesive strips completely unless your health care provider tells you to do that.  Check your port insertion site every day for signs of infection. Check for: ? Redness, swelling, or pain. ? Fluid or blood. ? Warmth. ? Pus or a bad smell.      Activity  Return to your normal activities as told by your health care provider. Ask your health care provider what activities are safe for you.  Do not  lift anything that is heavier than 10 lb (4.5 kg), or the limit that you are told, until your health care provider says that it is safe. General instructions  Take over-the-counter and prescription medicines only as told by your health care provider.  Do not take baths, swim, or use a hot tub until your health care provider approves. Ask your health care provider if you may take showers. You may only be allowed to take sponge baths.  Do not drive for 24 hours if you were given a sedative during your procedure.  Wear a medical alert bracelet in case of an emergency. This will tell any health care providers that you have a port.  Keep all follow-up visits as told by your health care provider. This is important. Contact a health care provider if:  You cannot flush your port with saline as directed, or you cannot draw blood from the port.  You have a fever or chills.  You have redness, swelling, or pain around your port insertion site.  You have fluid or blood coming from your port insertion site.  Your port insertion site feels warm to the touch.  You have pus or a bad smell coming from the port insertion site. Get help right away if:  You have chest pain or shortness of breath.  You have bleeding from your port that you cannot control. Summary  Take care of the port as told by your   health care provider. Keep the manufacturer's information card with you at all times.  Change your dressing as told by your health care provider.  Contact a health care provider if you have a fever or chills or if you have redness, swelling, or pain around your port insertion site.  Keep all follow-up visits as told by your health care provider. This information is not intended to replace advice given to you by your health care provider. Make sure you discuss any questions you have with your health care provider. Document Revised: 06/26/2018 Document Reviewed: 06/26/2018 Elsevier Patient Education   2021 Elsevier Inc.  

## 2020-12-29 NOTE — Patient Instructions (Signed)
Azacitidine suspension for injection (subcutaneous use) What is this medicine? AZACITIDINE (ay za SITE i deen) is a chemotherapy drug. This medicine reduces the growth of cancer cells and can suppress the immune system. It is used for treating myelodysplastic syndrome or some types of leukemia. This medicine may be used for other purposes; ask your health care provider or pharmacist if you have questions. COMMON BRAND NAME(S): Vidaza What should I tell my health care provider before I take this medicine? They need to know if you have any of these conditions:  kidney disease  liver disease  liver tumors  an unusual or allergic reaction to azacitidine, mannitol, other medicines, foods, dyes, or preservatives  pregnant or trying to get pregnant  breast-feeding How should I use this medicine? This medicine is for injection under the skin. It is administered in a hospital or clinic by a specially trained health care professional. Talk to your pediatrician regarding the use of this medicine in children. While this drug may be prescribed for selected conditions, precautions do apply. Overdosage: If you think you have taken too much of this medicine contact a poison control center or emergency room at once. NOTE: This medicine is only for you. Do not share this medicine with others. What if I miss a dose? It is important not to miss your dose. Call your doctor or health care professional if you are unable to keep an appointment. What may interact with this medicine? Interactions have not been studied. Give your health care provider a list of all the medicines, herbs, non-prescription drugs, or dietary supplements you use. Also tell them if you smoke, drink alcohol, or use illegal drugs. Some items may interact with your medicine. This list may not describe all possible interactions. Give your health care provider a list of all the medicines, herbs, non-prescription drugs, or dietary supplements  you use. Also tell them if you smoke, drink alcohol, or use illegal drugs. Some items may interact with your medicine. What should I watch for while using this medicine? Visit your doctor for checks on your progress. This drug may make you feel generally unwell. This is not uncommon, as chemotherapy can affect healthy cells as well as cancer cells. Report any side effects. Continue your course of treatment even though you feel ill unless your doctor tells you to stop. In some cases, you may be given additional medicines to help with side effects. Follow all directions for their use. Call your doctor or health care professional for advice if you get a fever, chills or sore throat, or other symptoms of a cold or flu. Do not treat yourself. This drug decreases your body's ability to fight infections. Try to avoid being around people who are sick. This medicine may increase your risk to bruise or bleed. Call your doctor or health care professional if you notice any unusual bleeding. You may need blood work done while you are taking this medicine. Do not become pregnant while taking this medicine and for 6 months after the last dose. Women should inform their doctor if they wish to become pregnant or think they might be pregnant. Men should not father a child while taking this medicine and for 3 months after the last dose. There is a potential for serious side effects to an unborn child. Talk to your health care professional or pharmacist for more information. Do not breast-feed an infant while taking this medicine and for 1 week after the last dose. This medicine may interfere with   the ability to have a child. Talk with your doctor or health care professional if you are concerned about your fertility. What side effects may I notice from receiving this medicine? Side effects that you should report to your doctor or health care professional as soon as possible:  allergic reactions like skin rash, itching or  hives, swelling of the face, lips, or tongue  low blood counts - this medicine may decrease the number of white blood cells, red blood cells and platelets. You may be at increased risk for infections and bleeding.  signs of infection - fever or chills, cough, sore throat, pain passing urine  signs of decreased platelets or bleeding - bruising, pinpoint red spots on the skin, black, tarry stools, blood in the urine  signs of decreased red blood cells - unusually weak or tired, fainting spells, lightheadedness  signs and symptoms of kidney injury like trouble passing urine or change in the amount of urine  signs and symptoms of liver injury like dark yellow or brown urine; general ill feeling or flu-like symptoms; light-colored stools; loss of appetite; nausea; right upper belly pain; unusually weak or tired; yellowing of the eyes or skin Side effects that usually do not require medical attention (report to your doctor or health care professional if they continue or are bothersome):  constipation  diarrhea  nausea, vomiting  pain or redness at the injection site  unusually weak or tired This list may not describe all possible side effects. Call your doctor for medical advice about side effects. You may report side effects to FDA at 1-800-FDA-1088. Where should I keep my medicine? This drug is given in a hospital or clinic and will not be stored at home. NOTE: This sheet is a summary. It may not cover all possible information. If you have questions about this medicine, talk to your doctor, pharmacist, or health care provider.  2021 Elsevier/Gold Standard (2016-12-27 14:37:51)  

## 2020-12-29 NOTE — Progress Notes (Signed)
Ok to treat today with CBC yesterday and CMET 1/11 per Dr Marin Olp.

## 2020-12-29 NOTE — Progress Notes (Signed)
Patient in chemotherapy education class with  Wife Jenny Reichmann.  Discussed side effects of Azacytidine SQ                               which include but are not limited to myelosuppression, decreased appetite, fatigue, fever, allergic or infusional reaction, mucositis, cardiac toxicity, cough, SOB, altered taste, nausea and vomiting, diarrhea, constipation, elevated LFTs myalgia and arthralgias, hair loss or thinning, rash, skin dryness, nail changes, peripheral neuropathy, discolored urine, delayed wound healing, mental changes (Chemo brain), increased risk of infections, weight loss, need for blood transfusions or platelets.  Reviewed infusion room and office policy and procedure and phone numbers 24 hours x 7 days a week.   Reviewed when to call the office with any concerns or problems.  Scientist, clinical (histocompatibility and immunogenetics) given.  Discussed portacath insertion and EMLA cream administration.  Antiemetic protocol and chemotherapy schedule reviewed. Patient verbalized understanding of chemotherapy indications and possible side effects.  Teachback done

## 2020-12-30 ENCOUNTER — Inpatient Hospital Stay: Payer: PPO

## 2020-12-30 ENCOUNTER — Telehealth: Payer: Self-pay | Admitting: *Deleted

## 2020-12-30 ENCOUNTER — Other Ambulatory Visit: Payer: Self-pay

## 2020-12-30 VITALS — BP 140/56 | HR 74 | Temp 98.3°F | Resp 16

## 2020-12-30 DIAGNOSIS — Z5111 Encounter for antineoplastic chemotherapy: Secondary | ICD-10-CM | POA: Diagnosis not present

## 2020-12-30 DIAGNOSIS — D46Z Other myelodysplastic syndromes: Secondary | ICD-10-CM

## 2020-12-30 MED ORDER — ONDANSETRON HCL 8 MG PO TABS: 8.0000 mg | ORAL_TABLET | Freq: Once | ORAL | Status: DC

## 2020-12-30 MED ORDER — AZACITIDINE CHEMO SQ INJECTION
75.0000 mg/m2 | Freq: Once | INTRAMUSCULAR | Status: AC
  Administered 2020-12-30: 165 mg via SUBCUTANEOUS
  Filled 2020-12-30: qty 6.6

## 2020-12-30 NOTE — Telephone Encounter (Signed)
Call placed back to patient's wife, Jenny Reichmann and she states that pt has tested negative with home covid-19 test and has no symptoms of covid-19 at this time.  Informed Cindy per order of Dr. Marin Olp that appts will be canceled for the rest of this cycle, to have pt retest on Monday, 01/04/21 and to call office with results.  Pt.'s wife is appreciative of call back and has no further questions at this time.

## 2020-12-30 NOTE — Patient Instructions (Signed)
Azacitidine suspension for injection (subcutaneous use) What is this medicine? AZACITIDINE (ay za SITE i deen) is a chemotherapy drug. This medicine reduces the growth of cancer cells and can suppress the immune system. It is used for treating myelodysplastic syndrome or some types of leukemia. This medicine may be used for other purposes; ask your health care provider or pharmacist if you have questions. COMMON BRAND NAME(S): Vidaza What should I tell my health care provider before I take this medicine? They need to know if you have any of these conditions:  kidney disease  liver disease  liver tumors  an unusual or allergic reaction to azacitidine, mannitol, other medicines, foods, dyes, or preservatives  pregnant or trying to get pregnant  breast-feeding How should I use this medicine? This medicine is for injection under the skin. It is administered in a hospital or clinic by a specially trained health care professional. Talk to your pediatrician regarding the use of this medicine in children. While this drug may be prescribed for selected conditions, precautions do apply. Overdosage: If you think you have taken too much of this medicine contact a poison control center or emergency room at once. NOTE: This medicine is only for you. Do not share this medicine with others. What if I miss a dose? It is important not to miss your dose. Call your doctor or health care professional if you are unable to keep an appointment. What may interact with this medicine? Interactions have not been studied. Give your health care provider a list of all the medicines, herbs, non-prescription drugs, or dietary supplements you use. Also tell them if you smoke, drink alcohol, or use illegal drugs. Some items may interact with your medicine. This list may not describe all possible interactions. Give your health care provider a list of all the medicines, herbs, non-prescription drugs, or dietary supplements  you use. Also tell them if you smoke, drink alcohol, or use illegal drugs. Some items may interact with your medicine. What should I watch for while using this medicine? Visit your doctor for checks on your progress. This drug may make you feel generally unwell. This is not uncommon, as chemotherapy can affect healthy cells as well as cancer cells. Report any side effects. Continue your course of treatment even though you feel ill unless your doctor tells you to stop. In some cases, you may be given additional medicines to help with side effects. Follow all directions for their use. Call your doctor or health care professional for advice if you get a fever, chills or sore throat, or other symptoms of a cold or flu. Do not treat yourself. This drug decreases your body's ability to fight infections. Try to avoid being around people who are sick. This medicine may increase your risk to bruise or bleed. Call your doctor or health care professional if you notice any unusual bleeding. You may need blood work done while you are taking this medicine. Do not become pregnant while taking this medicine and for 6 months after the last dose. Women should inform their doctor if they wish to become pregnant or think they might be pregnant. Men should not father a child while taking this medicine and for 3 months after the last dose. There is a potential for serious side effects to an unborn child. Talk to your health care professional or pharmacist for more information. Do not breast-feed an infant while taking this medicine and for 1 week after the last dose. This medicine may interfere with   the ability to have a child. Talk with your doctor or health care professional if you are concerned about your fertility. What side effects may I notice from receiving this medicine? Side effects that you should report to your doctor or health care professional as soon as possible:  allergic reactions like skin rash, itching or  hives, swelling of the face, lips, or tongue  low blood counts - this medicine may decrease the number of white blood cells, red blood cells and platelets. You may be at increased risk for infections and bleeding.  signs of infection - fever or chills, cough, sore throat, pain passing urine  signs of decreased platelets or bleeding - bruising, pinpoint red spots on the skin, black, tarry stools, blood in the urine  signs of decreased red blood cells - unusually weak or tired, fainting spells, lightheadedness  signs and symptoms of kidney injury like trouble passing urine or change in the amount of urine  signs and symptoms of liver injury like dark yellow or brown urine; general ill feeling or flu-like symptoms; light-colored stools; loss of appetite; nausea; right upper belly pain; unusually weak or tired; yellowing of the eyes or skin Side effects that usually do not require medical attention (report to your doctor or health care professional if they continue or are bothersome):  constipation  diarrhea  nausea, vomiting  pain or redness at the injection site  unusually weak or tired This list may not describe all possible side effects. Call your doctor for medical advice about side effects. You may report side effects to FDA at 1-800-FDA-1088. Where should I keep my medicine? This drug is given in a hospital or clinic and will not be stored at home. NOTE: This sheet is a summary. It may not cover all possible information. If you have questions about this medicine, talk to your doctor, pharmacist, or health care provider.  2021 Elsevier/Gold Standard (2016-12-27 14:37:51)  

## 2020-12-30 NOTE — Telephone Encounter (Signed)
Call received from pt.'s wife, Jenny Reichmann stating that she has tested positive for covid-19 and would like to know what she should do for her husband.  Jenny Reichmann states that she does have a home covid-19 test and will have pt take the test now.  Informed Jenny Reichmann that I would call her back once I spoke with Dr. Marin Olp.

## 2020-12-31 ENCOUNTER — Inpatient Hospital Stay: Payer: PPO

## 2021-01-01 ENCOUNTER — Other Ambulatory Visit (HOSPITAL_COMMUNITY): Payer: PPO

## 2021-01-01 ENCOUNTER — Inpatient Hospital Stay: Payer: PPO

## 2021-01-04 ENCOUNTER — Telehealth: Payer: Self-pay | Admitting: *Deleted

## 2021-01-04 ENCOUNTER — Other Ambulatory Visit: Payer: Self-pay | Admitting: *Deleted

## 2021-01-04 ENCOUNTER — Other Ambulatory Visit: Payer: PPO

## 2021-01-04 ENCOUNTER — Inpatient Hospital Stay: Payer: PPO

## 2021-01-04 ENCOUNTER — Other Ambulatory Visit: Payer: Self-pay | Admitting: Hematology & Oncology

## 2021-01-04 DIAGNOSIS — D46Z Other myelodysplastic syndromes: Secondary | ICD-10-CM

## 2021-01-04 DIAGNOSIS — D61818 Other pancytopenia: Secondary | ICD-10-CM

## 2021-01-04 NOTE — Telephone Encounter (Signed)
Call received from patient's wife to inform Dr. Marin Olp that pt tested positive for Covid-19 this morning with a home test.  She states that pt is having sinus drainage, cough and a sore throat with no fever or chills.  Dr. Marin Olp notified.  Pt.'s wife notified per order of Dr. Marin Olp that an order will be placed for an ambulatory referral for covid treatment and for pt to return this Thursday 01/07/21 at 3:00PM for labs only.  Pt.'s wife is appreciative of assistance and has no further questions at this time.

## 2021-01-04 NOTE — Telephone Encounter (Signed)
Duplicate-opened in error. 

## 2021-01-05 ENCOUNTER — Other Ambulatory Visit: Payer: Self-pay | Admitting: Nurse Practitioner

## 2021-01-05 ENCOUNTER — Inpatient Hospital Stay: Payer: PPO

## 2021-01-05 ENCOUNTER — Ambulatory Visit (HOSPITAL_COMMUNITY)
Admission: RE | Admit: 2021-01-05 | Discharge: 2021-01-05 | Disposition: A | Discharge status: Home / Self Care | Payer: PPO | Source: Ambulatory Visit | Attending: Pulmonary Disease | Admitting: Pulmonary Disease

## 2021-01-05 DIAGNOSIS — Z951 Presence of aortocoronary bypass graft: Secondary | ICD-10-CM

## 2021-01-05 DIAGNOSIS — R079 Chest pain, unspecified: Secondary | ICD-10-CM

## 2021-01-05 DIAGNOSIS — D46Z Other myelodysplastic syndromes: Secondary | ICD-10-CM | POA: Diagnosis not present

## 2021-01-05 DIAGNOSIS — U071 COVID-19: Secondary | ICD-10-CM | POA: Diagnosis not present

## 2021-01-05 DIAGNOSIS — E118 Type 2 diabetes mellitus with unspecified complications: Secondary | ICD-10-CM

## 2021-01-05 MED ORDER — METHYLPREDNISOLONE SODIUM SUCC 125 MG IJ SOLR: 125.0000 mg | Freq: Once | INTRAMUSCULAR | Status: DC | PRN

## 2021-01-05 MED ORDER — SOTROVIMAB 500 MG/8ML IV SOLN
500.0000 mg | Freq: Once | INTRAVENOUS | Status: AC
  Administered 2021-01-05: 500 mg via INTRAVENOUS

## 2021-01-05 MED ORDER — EPINEPHRINE 0.3 MG/0.3ML IJ SOAJ: 0.3000 mg | Freq: Once | INTRAMUSCULAR | Status: DC | PRN

## 2021-01-05 MED ORDER — SODIUM CHLORIDE 0.9 % IV SOLN: INTRAVENOUS | Status: DC | PRN

## 2021-01-05 MED ORDER — FAMOTIDINE IN NACL 20-0.9 MG/50ML-% IV SOLN: 20.0000 mg | Freq: Once | INTRAVENOUS | Status: DC | PRN

## 2021-01-05 MED ORDER — HEPARIN SOD (PORK) LOCK FLUSH 100 UNIT/ML IV SOLN
500.0000 [IU] | Freq: Once | INTRAVENOUS | Status: AC
  Administered 2021-01-05: 500 [IU] via INTRAVENOUS

## 2021-01-05 MED ORDER — DIPHENHYDRAMINE HCL 50 MG/ML IJ SOLN: 50.0000 mg | Freq: Once | INTRAMUSCULAR | Status: DC | PRN

## 2021-01-05 MED ORDER — ALBUTEROL SULFATE HFA 108 (90 BASE) MCG/ACT IN AERS: 2.0000 | INHALATION_SPRAY | Freq: Once | RESPIRATORY_TRACT | Status: DC | PRN

## 2021-01-05 NOTE — Progress Notes (Signed)
Diagnosis: COVID-19  Physician: Dr. Patrick Wright  Procedure: Covid Infusion Clinic Med: Sotrovimab infusion - Provided patient with sotrovimab fact sheet for patients, parents, and caregivers prior to infusion.   Complications: No immediate complications noted  Discharge: Discharged home    

## 2021-01-05 NOTE — Progress Notes (Signed)
Patient reviewed Fact Sheet for Patients, Parents, and Caregivers for Emergency Use Authorization (EUA) of sotrovimab for the Treatment of Coronavirus. Patient also reviewed and is agreeable to the estimated cost of treatment. Patient is agreeable to proceed.   

## 2021-01-05 NOTE — Progress Notes (Signed)
I connected by phone with Theodore Jenkins on 01/05/2021 at 9:52 AM to discuss the potential use of a new treatment for mild to moderate COVID-19 viral infection in non-hospitalized patients.  This patient is a 75 y.o. male that meets the FDA criteria for Emergency Use Authorization of COVID monoclonal antibody sotrovimab.  Has a (+) direct SARS-CoV-2 viral test result  Has mild or moderate COVID-19   Is NOT hospitalized due to COVID-19  Is within 10 days of symptom onset  Has at least one of the high risk factor(s) for progression to severe COVID-19 and/or hospitalization as defined in EUA.  Specific high risk criteria : Older age (>/= 73 yo), BMI > 25, Diabetes, Immunosuppressive Disease or Treatment, Cardiovascular disease or hypertension and Other high risk medical condition per CDC:  MDS active chemo w/Vidaza    I have spoken and communicated the following to the patient or parent/caregiver regarding COVID monoclonal antibody treatment:  1. FDA has authorized the emergency use for the treatment of mild to moderate COVID-19 in adults and pediatric patients with positive results of direct SARS-CoV-2 viral testing who are 10 years of age and older weighing at least 40 kg, and who are at high risk for progressing to severe COVID-19 and/or hospitalization.  2. The significant known and potential risks and benefits of COVID monoclonal antibody, and the extent to which such potential risks and benefits are unknown.  3. Information on available alternative treatments and the risks and benefits of those alternatives, including clinical trials.  4. Patients treated with COVID monoclonal antibody should continue to self-isolate and use infection control measures (e.g., wear mask, isolate, social distance, avoid sharing personal items, clean and disinfect "high touch" surfaces, and frequent handwashing) according to CDC guidelines.   5. The patient or parent/caregiver has the option to accept or refuse  COVID monoclonal antibody treatment.  After reviewing this information with the patient, the patient has agreed to receive one of the available covid 19 monoclonal antibodies and will be provided an appropriate fact sheet prior to infusion. Jobe Gibbon, NP 01/05/2021 9:52 AM

## 2021-01-05 NOTE — Discharge Instructions (Signed)

## 2021-01-06 ENCOUNTER — Inpatient Hospital Stay: Payer: PPO

## 2021-01-07 ENCOUNTER — Other Ambulatory Visit: Payer: Self-pay | Admitting: *Deleted

## 2021-01-07 ENCOUNTER — Other Ambulatory Visit: Payer: Self-pay

## 2021-01-07 ENCOUNTER — Inpatient Hospital Stay: Payer: PPO

## 2021-01-07 ENCOUNTER — Other Ambulatory Visit: Payer: Self-pay | Admitting: Family

## 2021-01-07 DIAGNOSIS — D46Z Other myelodysplastic syndromes: Secondary | ICD-10-CM

## 2021-01-07 DIAGNOSIS — D649 Anemia, unspecified: Secondary | ICD-10-CM

## 2021-01-07 DIAGNOSIS — D61818 Other pancytopenia: Secondary | ICD-10-CM

## 2021-01-07 DIAGNOSIS — Z5111 Encounter for antineoplastic chemotherapy: Secondary | ICD-10-CM | POA: Diagnosis not present

## 2021-01-07 LAB — CBC WITH DIFFERENTIAL (CANCER CENTER ONLY)
Abs Immature Granulocytes: 0.03 10*3/uL (ref 0.00–0.07)
Basophils Absolute: 0 10*3/uL (ref 0.0–0.1)
Basophils Relative: 0 %
Eosinophils Absolute: 0 10*3/uL (ref 0.0–0.5)
Eosinophils Relative: 0 %
HCT: 20.7 % — ABNORMAL LOW (ref 39.0–52.0)
Hemoglobin: 7 g/dL — ABNORMAL LOW (ref 13.0–17.0)
Immature Granulocytes: 2 %
Lymphocytes Relative: 45 %
Lymphs Abs: 0.7 10*3/uL (ref 0.7–4.0)
MCH: 32.9 pg (ref 26.0–34.0)
MCHC: 33.8 g/dL (ref 30.0–36.0)
MCV: 97.2 fL (ref 80.0–100.0)
Monocytes Absolute: 0.2 10*3/uL (ref 0.1–1.0)
Monocytes Relative: 13 %
Neutro Abs: 0.6 10*3/uL — ABNORMAL LOW (ref 1.7–7.7)
Neutrophils Relative %: 40 %
Platelet Count: 28 10*3/uL — ABNORMAL LOW (ref 150–400)
RBC: 2.13 MIL/uL — ABNORMAL LOW (ref 4.22–5.81)
RDW: 18 % — ABNORMAL HIGH (ref 11.5–15.5)
WBC Count: 1.4 10*3/uL — ABNORMAL LOW (ref 4.0–10.5)
nRBC: 0 % (ref 0.0–0.2)

## 2021-01-07 LAB — BASIC METABOLIC PANEL - CANCER CENTER ONLY
Anion gap: 8 (ref 5–15)
BUN: 25 mg/dL — ABNORMAL HIGH (ref 8–23)
CO2: 23 mmol/L (ref 22–32)
Calcium: 9.7 mg/dL (ref 8.9–10.3)
Chloride: 99 mmol/L (ref 98–111)
Creatinine: 1.31 mg/dL — ABNORMAL HIGH (ref 0.61–1.24)
GFR, Estimated: 58 mL/min — ABNORMAL LOW (ref >60)
Glucose, Bld: 117 mg/dL — ABNORMAL HIGH (ref 70–99)
Potassium: 3.9 mmol/L (ref 3.5–5.1)
Sodium: 130 mmol/L — ABNORMAL LOW (ref 135–145)

## 2021-01-07 LAB — SAMPLE TO BLOOD BANK

## 2021-01-07 LAB — PREPARE RBC (CROSSMATCH)

## 2021-01-07 MED ORDER — HEPARIN SOD (PORK) LOCK FLUSH 100 UNIT/ML IV SOLN
500.0000 [IU] | Freq: Once | INTRAVENOUS | Status: AC
  Administered 2021-01-07: 500 [IU] via INTRAVENOUS
  Filled 2021-01-07: qty 5

## 2021-01-07 MED ORDER — SODIUM CHLORIDE 0.9% FLUSH
10.0000 mL | Freq: Once | INTRAVENOUS | Status: AC
  Administered 2021-01-07: 10 mL via INTRAVENOUS
  Filled 2021-01-07: qty 10

## 2021-01-07 NOTE — Patient Instructions (Signed)
Tunneled Central Venous Catheter Flushing Guide  It is important to flush your tunneled central venous catheter each time you use it, both before and after you use it. Flushing your catheter will help prevent it from clogging. What are the risks? Risks may include:  Infection.  Air getting into the catheter and bloodstream. Supplies needed:  A clean pair of gloves.  A disinfecting wipe. Use an alcohol wipe, chlorhexidine wipe, or iodine wipe as told by your health care provider.  A 10 mL syringe that has been prefilled with saline solution.  An empty 10 mL syringe, if a substance called heparin was injected into your catheter. How to flush your catheter When you flush your catheter, make sure you follow any specific instructions from your health care provider or the manufacturer. These are general guidelines. Flushing your catheter before use If there is heparin in your catheter: 1. Wash your hands with soap and water. 2. Put on gloves. 3. Scrub the injection cap for a minimum of 15 seconds with a disinfecting wipe. 4. Unclamp the catheter. 5. Attach the empty syringe to the injection cap. 6. Pull the syringe plunger back and withdraw 10 mL of blood. 7. Place the syringe into an appropriate waste container. 8. Scrub the injection cap for 15 seconds with a disinfecting wipe. 9. Attach the prefilled syringe to the injection cap. 10. Flush the catheter by pushing the plunger forward until all the liquid from the syringe is in the catheter. 11. Remove the syringe from the injection cap. 12. Clamp the catheter. If there is no heparin in your catheter: 1. Wash your hands with soap and water. 2. Put on gloves. 3. Scrub the injection cap for 15 seconds with a disinfecting wipe. 4. Unclamp the catheter. 5. Attach the prefilled syringe to the injection cap. 6. Flush the catheter by pushing the plunger forward until 5 mL of the liquid from the syringe is in the catheter. 7. Pull back on  the syringe until you see blood in the catheter. 8. If you have been asked to collect any blood, follow your health care provider's instructions. Otherwise, flush the catheter with the rest of the solution from the syringe. 9. Remove the syringe from the injection cap. 10. Clamp the catheter.   Flushing your catheter after use 1. Wash your hands with soap and water. 2. Put on gloves. 3. Scrub the injection cap for 15 seconds with a disinfecting wipe. 4. Unclamp the catheter. 5. Attach the prefilled syringe to the injection cap. 6. Flush the catheter by pushing the plunger forward until all of the liquid from the syringe is in the catheter. 7. Remove the syringe from the injection cap. 8. Clamp the catheter. Problems and solutions  If blood cannot be completely cleared from the injection cap, you may need to have the injection cap replaced.  If the catheter is difficult to flush, use the pulsing method. The pulsing method involves pushing only a few milliliters of solution into the catheter at a time and pausing between pushes.  If you do not see blood in the catheter when you pull back on the syringe, change your body position, such as by raising your arms above your head. Take a deep breath and cough. Then, pull back on the syringe. If you still do not see blood, flush the catheter with a small amount of solution. Then, change positions again and take a breath or cough. Pull back on the syringe again. If you still do not   see blood, finish flushing the catheter and contact your health care provider. Do not use your catheter until your health care provider says it is okay. General tips  Have someone help you flush your catheter, if possible.  Do not force fluid through your catheter.  Do not use a syringe that is larger or smaller than 10 mL. Using a smaller syringe can make the catheter burst.  Do not use your catheter without flushing it first if it has heparin in it. Contact a health  care provider if:  You cannot see any blood in the catheter when you flush it before using it.  Your catheter is difficult to flush. Get help right away if:  You cannot flush the catheter.  The catheter leaks when you flush it or when there is fluid in it.  There are cracks or breaks in the catheter. Summary  It is important to flush your tunneled central venous catheter each time you use it, both before and after you use it.  Scrub the injection cap for 15 seconds with a disinfecting wipe before and after you flush it.  When you flush your catheter, make sure you follow any specific instructions from your health care provider or the manufacturer.  Get help right away if you cannot flush the catheter. This information is not intended to replace advice given to you by your health care provider. Make sure you discuss any questions you have with your health care provider. Document Revised: 02/06/2020 Document Reviewed: 02/13/2019 Elsevier Patient Education  2021 Elsevier Inc.  

## 2021-01-08 ENCOUNTER — Inpatient Hospital Stay: Payer: PPO

## 2021-01-08 DIAGNOSIS — D46Z Other myelodysplastic syndromes: Secondary | ICD-10-CM

## 2021-01-08 DIAGNOSIS — Z5111 Encounter for antineoplastic chemotherapy: Secondary | ICD-10-CM | POA: Diagnosis not present

## 2021-01-08 DIAGNOSIS — D649 Anemia, unspecified: Secondary | ICD-10-CM

## 2021-01-08 MED ORDER — ACETAMINOPHEN 325 MG PO TABS
650.0000 mg | ORAL_TABLET | Freq: Once | ORAL | Status: AC
  Administered 2021-01-08: 650 mg via ORAL

## 2021-01-08 MED ORDER — SODIUM CHLORIDE 0.9% IV SOLUTION
250.0000 mL | Freq: Once | INTRAVENOUS | Status: AC
  Administered 2021-01-08: 250 mL via INTRAVENOUS
  Filled 2021-01-08: qty 250

## 2021-01-08 MED ORDER — ACETAMINOPHEN 325 MG PO TABS
ORAL_TABLET | ORAL | Status: AC
  Filled 2021-01-08: qty 2

## 2021-01-08 MED ORDER — SODIUM CHLORIDE 0.9% FLUSH
10.0000 mL | INTRAVENOUS | Status: AC | PRN
  Administered 2021-01-08: 10 mL
  Filled 2021-01-08: qty 10

## 2021-01-08 MED ORDER — DIPHENHYDRAMINE HCL 25 MG PO CAPS
25.0000 mg | ORAL_CAPSULE | Freq: Once | ORAL | Status: AC
  Administered 2021-01-08: 25 mg via ORAL

## 2021-01-08 MED ORDER — DIPHENHYDRAMINE HCL 25 MG PO CAPS
ORAL_CAPSULE | ORAL | Status: AC
  Filled 2021-01-08: qty 1

## 2021-01-08 MED ORDER — HEPARIN SOD (PORK) LOCK FLUSH 100 UNIT/ML IV SOLN
500.0000 [IU] | Freq: Every day | INTRAVENOUS | Status: AC | PRN
  Administered 2021-01-08: 500 [IU]
  Filled 2021-01-08: qty 5

## 2021-01-08 MED ORDER — FUROSEMIDE 10 MG/ML IJ SOLN: 20.0000 mg | Freq: Once | INTRAMUSCULAR | Status: DC

## 2021-01-08 NOTE — Patient Instructions (Signed)

## 2021-01-09 LAB — BPAM RBC
Blood Product Expiration Date: 202202202359
ISSUE DATE / TIME: 202201280755
Unit Type and Rh: 6200

## 2021-01-09 LAB — TYPE AND SCREEN
ABO/RH(D): A POS
Antibody Screen: NEGATIVE
Unit division: 0

## 2021-01-11 ENCOUNTER — Other Ambulatory Visit: Payer: Self-pay | Admitting: *Deleted

## 2021-01-11 DIAGNOSIS — D649 Anemia, unspecified: Secondary | ICD-10-CM

## 2021-01-11 DIAGNOSIS — D46Z Other myelodysplastic syndromes: Secondary | ICD-10-CM

## 2021-01-13 ENCOUNTER — Inpatient Hospital Stay: Payer: PPO | Attending: Hematology & Oncology

## 2021-01-13 ENCOUNTER — Other Ambulatory Visit: Payer: Self-pay

## 2021-01-13 ENCOUNTER — Telehealth: Payer: Self-pay

## 2021-01-13 DIAGNOSIS — U071 COVID-19: Secondary | ICD-10-CM | POA: Diagnosis not present

## 2021-01-13 DIAGNOSIS — D649 Anemia, unspecified: Secondary | ICD-10-CM

## 2021-01-13 DIAGNOSIS — Z5111 Encounter for antineoplastic chemotherapy: Secondary | ICD-10-CM | POA: Insufficient documentation

## 2021-01-13 DIAGNOSIS — D4622 Refractory anemia with excess of blasts 2: Secondary | ICD-10-CM | POA: Diagnosis not present

## 2021-01-13 DIAGNOSIS — D61818 Other pancytopenia: Secondary | ICD-10-CM | POA: Diagnosis not present

## 2021-01-13 DIAGNOSIS — D46Z Other myelodysplastic syndromes: Secondary | ICD-10-CM

## 2021-01-13 DIAGNOSIS — Z79899 Other long term (current) drug therapy: Secondary | ICD-10-CM | POA: Insufficient documentation

## 2021-01-13 LAB — CMP (CANCER CENTER ONLY)
ALT: 13 U/L (ref 0–44)
AST: 23 U/L (ref 15–41)
Albumin: 4.8 g/dL (ref 3.5–5.0)
Alkaline Phosphatase: 40 U/L (ref 38–126)
Anion gap: 9 (ref 5–15)
BUN: 22 mg/dL (ref 8–23)
CO2: 25 mmol/L (ref 22–32)
Calcium: 10.1 mg/dL (ref 8.9–10.3)
Chloride: 98 mmol/L (ref 98–111)
Creatinine: 1.33 mg/dL — ABNORMAL HIGH (ref 0.61–1.24)
GFR, Estimated: 57 mL/min — ABNORMAL LOW (ref >60)
Glucose, Bld: 161 mg/dL — ABNORMAL HIGH (ref 70–99)
Potassium: 4.1 mmol/L (ref 3.5–5.1)
Sodium: 132 mmol/L — ABNORMAL LOW (ref 135–145)
Total Bilirubin: 0.5 mg/dL (ref 0.3–1.2)
Total Protein: 7.7 g/dL (ref 6.5–8.1)

## 2021-01-13 LAB — CBC WITH DIFFERENTIAL (CANCER CENTER ONLY)
Abs Immature Granulocytes: 0 10*3/uL (ref 0.00–0.07)
Basophils Absolute: 0 10*3/uL (ref 0.0–0.1)
Basophils Relative: 0 %
Eosinophils Absolute: 0 10*3/uL (ref 0.0–0.5)
Eosinophils Relative: 0 %
HCT: 24.6 % — ABNORMAL LOW (ref 39.0–52.0)
Hemoglobin: 8.1 g/dL — ABNORMAL LOW (ref 13.0–17.0)
Lymphocytes Relative: 47 %
Lymphs Abs: 0.7 10*3/uL (ref 0.7–4.0)
MCH: 32 pg (ref 26.0–34.0)
MCHC: 32.9 g/dL (ref 30.0–36.0)
MCV: 97.2 fL (ref 80.0–100.0)
Metamyelocytes Relative: 1 %
Monocytes Absolute: 0 10*3/uL — ABNORMAL LOW (ref 0.1–1.0)
Monocytes Relative: 0 %
Neutro Abs: 0.7 10*3/uL — ABNORMAL LOW (ref 1.7–7.7)
Neutrophils Relative %: 46 %
Other: 6 %
Platelet Count: 17 10*3/uL — ABNORMAL LOW (ref 150–400)
RBC: 2.53 MIL/uL — ABNORMAL LOW (ref 4.22–5.81)
RDW: 18.2 % — ABNORMAL HIGH (ref 11.5–15.5)
WBC Count: 1.5 10*3/uL — ABNORMAL LOW (ref 4.0–10.5)
WBC Morphology: 6
nRBC: 0 % (ref 0.0–0.2)

## 2021-01-13 LAB — SAMPLE TO BLOOD BANK

## 2021-01-13 NOTE — Telephone Encounter (Signed)
Called and informed patient of lab results, patient verbalized udnerstanding and understands scheduling will call him to schedule lab appt for next week.

## 2021-01-13 NOTE — Telephone Encounter (Signed)
-----   Message from Theodore Napoleon, MD sent at 01/13/2021  3:50 PM EST ----- Call and let him know that his blood counts actually do not look too bad.  His platelet count is down a little bit but we do not have to do any transfusions.  Make sure he comes back in 1 week for lab work again.  Theodore Jenkins

## 2021-01-14 ENCOUNTER — Other Ambulatory Visit: Payer: Self-pay

## 2021-01-14 ENCOUNTER — Ambulatory Visit: Payer: PPO | Admitting: Cardiovascular Disease

## 2021-01-14 ENCOUNTER — Encounter: Payer: Self-pay | Admitting: Cardiovascular Disease

## 2021-01-14 VITALS — BP 118/70 | HR 71 | Ht 69.0 in | Wt 220.8 lb

## 2021-01-14 DIAGNOSIS — I350 Nonrheumatic aortic (valve) stenosis: Secondary | ICD-10-CM

## 2021-01-14 DIAGNOSIS — E785 Hyperlipidemia, unspecified: Secondary | ICD-10-CM | POA: Diagnosis not present

## 2021-01-14 DIAGNOSIS — I2510 Atherosclerotic heart disease of native coronary artery without angina pectoris: Secondary | ICD-10-CM

## 2021-01-14 LAB — PATHOLOGIST SMEAR REVIEW

## 2021-01-14 NOTE — Progress Notes (Deleted)
No chief complaint on file.  History of Present Illness:  75 yo male with history of CAD s/p PCI of the LAD in 2005, CABG (LIMA to LAD and SVG to OM) in 2018, severe aortic stenosis s/p bioprosthetic AVR in 2018, CVA s/p right carotid endarterectomy with subsequent right internal carotid artery occlusion, Barrett's esophagitis, AAA, HTN, HLD and DM who is here today for cardiac follow up. He underwent 2V CABG and AVR in 2018. Echo June 2019 with LVEF=55-60% with normally functioning bioprosthetic AVR. He was admitted to [facility] May 2021 with fevers and malaise and his blood cultures grew gram positive cocci. TEE 05/01/20 with significant thickening of one of the bioprosthetic leaflets suggestive of endocarditis. He was treated with IV Rocephin and Gentamycin. Echo 07/13/20 with LVEF=55%, moderate LVH, mild MR. There was thickening of one of the leaflets of the bioprosthetic aortic valve but no aortic insufficiency or aortic stenosis. He was seen in our office 12/09/20 with c/o dyspnea with minimal exertion. I arranged labwork and he was found to have pancytopenia with Hgb 7.6, Hct 21.8 and platelets 33,000. He has since been diagnosed with a myelodysplastic disorder and has been followed in the Oncology office by Dr. Marin Olp. Echo 12/11/20 with LVEF=55-60%, trivial MR. Normal function of the bioprosthetic AVR.   He is here today for follow up. The patient denies any chest pain, dyspnea, palpitations, lower extremity edema, orthopnea, PND, dizziness, near syncope or syncope.   Primary Care Physician: Karlene Einstein, MD   Past Medical History:  Diagnosis Date  . Aortic stenosis    Echo (08/2017): Severe AS with mean gradient of 42 mmHg.  . Barrett's esophagus   . CAD (coronary artery disease) 06/06/2011   Remote stent to the LAD in 2005 // Last cath in 2010 showing diffuse CAD, small PD that is occluded and fills with left to right collaterals, 60-70% 1st OM, with normal EF. Managed medically // Myoview  3/18:  Normal perfusion. LVEF 55% with normal wall motion. This is a low risk study.  . Complication of anesthesia   . Diabetes mellitus   . ED (erectile dysfunction)   . Family history of adverse reaction to anesthesia 10/23/2017   sister had history of malignant hyperthermia when she was 54, she is 73 years old now  . GERD (gastroesophageal reflux disease)   . Goals of care, counseling/discussion 12/22/2020  . Hyperlipidemia   . Hypertension   . IHD (ischemic heart disease)    prior stenting of the LAD in December of 2005; with last cath in 2010 showing diffuse CAD with normal LV function. He is managed medically.   . Malignant hyperthermia    sister had reaction concerning for malignant hyperthermia (~ 1988)  . MDS (myelodysplastic syndrome), high grade ([place]) 12/22/2020  . Neuromuscular disorder ([place]) 10/23/2017   lumbar 4 disc in back causing nerve pain, on gabapentin  . Obesity   . Obstructive sleep apnea on CPAP   . PONV (postoperative nausea and vomiting)   . S/P aortic valve replacement with bioprosthetic valve 10/26/2017   25 mm [facility] Ease bovine pericardial tissue valve  . S/P coronary artery stent placement 2005   LAD    Past Surgical History:  Procedure Laterality Date  . AORTIC VALVE REPLACEMENT N/A 10/26/2017   Procedure: AORTIC VALVE REPLACEMENT (AVR), using Magna Ease 25;  Surgeon: Rexene Alberts, MD;  Location: [place];  Service: Open Heart Surgery;  Laterality: N/A;  . CARDIAC CATHETERIZATION  2010  NORMAL LV FUNCTION. DIFFUSE CORONARY DISEASE WITH THE DISTAL PORTION OF THE SMALL POSTERIOR DESCENDING VESSEL OCCLUDED AND FILLING WITH THE RIGHT COLLATERALS, 60-70% STENOSIS IN THE FIRST OBTUSE MARGINAL VESSEL AND DIFFUSE SMALL VESSEL DISEASE  . CAROTID ENDARTERECTOMY    . CIRCUMCISION  1985  . CORONARY ARTERY BYPASS GRAFT N/A 10/26/2017   Procedure: CORONARY ARTERY BYPASS GRAFTING (CABG) x two , using left internal mammary artery and right leg greater  saphenous vein harvested endoscopically;  Surgeon: Rexene Alberts, MD;  Location: [place];  Service: Open Heart Surgery;  Laterality: N/A;  . CORONARY STENT PLACEMENT  2005  . IR IMAGING GUIDED PORT INSERTION  12/28/2020  . RIGHT/LEFT HEART CATH AND CORONARY ANGIOGRAPHY N/A 08/25/2017   Procedure: RIGHT/LEFT HEART CATH AND CORONARY ANGIOGRAPHY;  Surgeon: Nelva Bush, MD;  Location: [place] [facility];  Service: Cardiovascular;  Laterality: N/A;  . rotator cuff surgery Right   . TEE WITHOUT CARDIOVERSION N/A 10/26/2017   Procedure: TRANSESOPHAGEAL ECHOCARDIOGRAM (TEE);  Surgeon: Rexene Alberts, MD;  Location: [place];  Service: Open Heart Surgery;  Laterality: N/A;  . TEE WITHOUT CARDIOVERSION N/A 05/01/2020   Procedure: TRANSESOPHAGEAL ECHOCARDIOGRAM (TEE);  Surgeon: Buford Dresser, MD;  Location: [facility] ENDOSCOPY;  Service: Cardiovascular;  Laterality: N/A;    Current Outpatient Medications  Medication Sig Dispense Refill  . acetaminophen (TYLENOL) 500 MG tablet Take 1,000 mg by mouth every 8 (eight) hours as needed for mild pain (or headaches).    [name] amoxicillin (AMOXIL) 500 MG capsule Take 2,000 mg by mouth See admin instructions. Take 2,000 mg by mouth one hour prior to dental appointments    . ascorbic acid (VITAMIN C) 500 MG tablet Take 500 mg by mouth daily with breakfast.    . ferrous sulfate 325 (65 FE) MG tablet Take 325 mg by mouth daily with breakfast.    . fluticasone (FLONASE) 50 MCG/ACT nasal spray Place 2 sprays into both nostrils in the morning.    . gabapentin (NEURONTIN) 300 MG capsule Take 600 mg by mouth at bedtime.     . lidocaine-prilocaine (EMLA) cream Apply to affected area once 30 g 3  . lisinopril (ZESTRIL) 5 MG tablet TAKE 1 TABLET BY MOUTH DAILY (Patient taking differently: Take 5 mg by mouth in the morning.) 90 tablet 2  . loratadine (CLARITIN) 10 MG tablet Take 10 mg by mouth daily.    . metoprolol succinate (TOPROL-XL) 50 MG 24 hr tablet Take 50 mg by mouth  at bedtime.    . Multiple Vitamins-Minerals (ONE-A-DAY 50 PLUS PO) Take 1 tablet by mouth daily with breakfast.    . nitroGLYCERIN (NITROSTAT) 0.4 MG SL tablet Place 1 tablet (0.4 mg total) under the tongue every 5 (five) minutes as needed for chest pain. 25 tablet 6  . ondansetron (ZOFRAN) 8 MG tablet Take 1 tablet (8 mg total) by mouth 2 (two) times daily as needed (Nausea or vomiting). 30 tablet 1  . ONETOUCH VERIO test strip 1 each by Other route daily.  3  . polyethylene glycol (MIRALAX / GLYCOLAX) 17 g packet Take 17 g by mouth daily. (Patient taking differently: Take 17 g by mouth every other day.) 14 each 0  . PRILOSEC OTC 20 MG tablet Take 20 mg by mouth daily at 6 PM.    . prochlorperazine (COMPAZINE) 10 MG tablet Take 1 tablet (10 mg total) by mouth every 6 (six) hours as needed (Nausea or vomiting). 30 tablet 1  . rosuvastatin (CRESTOR) 10 MG tablet Take  10 mg by mouth at bedtime.    . sitaGLIPtin-metformin (JANUMET) 50-1000 MG tablet Take 1 tablet by mouth 2 (two) times daily with a meal.     . vitamin B-12 (CYANOCOBALAMIN) 250 MCG tablet Take 250 mcg by mouth daily.     No current facility-administered medications for this visit.    Allergies  Allergen Reactions  . Oxycodone Nausea And Vomiting    Social History   Socioeconomic History  . Marital status: Married    Spouse name: Not on file  . Number of children: 3  . Years of education: Not on file  . Highest education level: Not on file  Occupational History  . Occupation: Retired-[employer]  . Smoking status: Former Smoker    Packs/day: 1.00    Years: 20.00    Pack years: 20.00    Types: Cigarettes    Quit date: 12/12/1992    Years since quitting: 28.1  . Smokeless tobacco: Never Used  Vaping Use  . Vaping Use: Never used  Substance and Sexual Activity  . Alcohol use: Yes    Alcohol/week: 1.0 standard drink    Types: 1 Cans of beer per week  . Drug use: No  . Sexual activity: Not  Currently  Other Topics Concern  . Not on file  Social History Narrative  . Not on file   Social Determinants of Health   Financial Resource Strain: Not on file  Food Insecurity: Not on file  Transportation Needs: Not on file  Physical Activity: Not on file  Stress: Not on file  Social Connections: Not on file  Intimate Partner Violence: Not on file    Family History  Problem Relation Age of Onset  . Cancer Mother   . Heart attack Father 74  . CAD Brother     Review of Systems:  As stated in the HPI and otherwise negative.   There were no vitals taken for this visit.  Physical Examination: General: Well developed, well nourished, NAD  HEENT: OP clear, mucus membranes moist  SKIN: warm, dry. No rashes. Neuro: No focal deficits  Musculoskeletal: Muscle strength 5/5 all ext  Psychiatric: Mood and affect normal  Neck: No JVD, no carotid bruits, no thyromegaly, no lymphadenopathy.  Lungs:Clear bilaterally, no wheezes, rhonci, crackles Cardiovascular: Regular rate and rhythm. No murmurs, gallops or rubs. Abdomen:Soft. Bowel sounds present. Non-tender.  Extremities: No lower extremity edema. Pulses are 2 + in the bilateral DP/PT.  Echo 12/11/20:  1. Left ventricular ejection fraction, by estimation, is 55 to 60%. The  left ventricle has normal function. The left ventricle has no regional  wall motion abnormalities. There is moderate concentric left ventricular  hypertrophy. Left ventricular  diastolic parameters are consistent with Grade II diastolic dysfunction  (pseudonormalization). Elevated left atrial pressure.  2. Right ventricular systolic function is normal. The right ventricular  size is normal. There is normal pulmonary artery systolic pressure.  3. Left atrial size was mildly dilated.  4. Right atrial size was mildly dilated.  5. The mitral valve is normal in structure. Trivial mitral valve  regurgitation.  6. The aortic valve has been  repaired/replaced. There is mild thickening  of the aortic valve. Aortic valve regurgitation is not visualized. No  aortic stenosis is present. There is a 25 mm Magna bioprosthetic valve  present in the aortic position.  Procedure Date: 2018. Echo findings are consistent with normal structure  and function of the aortic valve prosthesis. Aortic valve  mean gradient  measures 8.7 mmHg. Aortic valve Vmax measures 2.05 m/s.   Cardiac cath 08/25/17: Findings: 1. Multivessel coronary artery disease including diffuse mild to moderate proximal to mid LAD disease, 50-60% in-stent restenosis of the distal LAD stent, moderate-caliber OM2 branch with 80% proximal disease, and dominant RCA with chronic total occlusion of very small posterior descending artery. Disease predominantly affects small/distal vessels. 2. Severe aortic stenosis with mean transvalvular gradient by pullback of 66 mmHg and peak-to-peak gradient of 71 mmHg. 3. Normal left and right heart filling pressures. 4. Normal Fick cardiac output.  EKG:  EKG is *** ordered today. The ekg ordered today demonstrates   Recent Labs: 12/09/2020: NT-Pro BNP 415 01/13/2021: ALT 13; BUN 22; Creatinine 1.33; Hemoglobin 8.1; Platelet Count 17; Potassium 4.1; Sodium 132   Lipid Panel    Component Value Date/Time   CHOL 105 05/07/2013 0854   TRIG 110.0 05/07/2013 0854   HDL 35.80 (L) 05/07/2013 0854   CHOLHDL 3 05/07/2013 0854   VLDL 22.0 05/07/2013 0854   LDLCALC 47 05/07/2013 0854     Wt Readings from Last 3 Encounters:  12/28/20 219 lb (99.3 kg)  12/22/20 222 lb (100.7 kg)  12/11/20 220 lb 14.4 oz (100.2 kg)     Assessment and Plan:   1. Aortic stenosis s/p bioprosthetic AVR: TEE in May 2021 and TTE August 2021 with thickening of one of the leaflets of the bioprosthetic aortic valve. He had been treated for endocarditis in May 2021. No evidence of valvular insufficiency or stenosis by echo December 2021. Will continue ASA. Continue SBE  prophylaxis as necessary.   2. CAD s/p CABG without angina: He has no chest pain. Normal LV systolic function by echo December 2021. Continue ASA and statin  3. Hyperlipidemia: Lipids followed in primary care. LDL ***. Continue statin  4. Carotid artery disease: Followed in vascular surgery office  5. Dyspnea: This is likely due to his anemia secondary to his myelodysplastic disorder. This is being managed in the oncology office. .   Labs/ tests ordered today include:   No orders of the defined types were placed in this encounter.  Disposition:   Follow up with me in 12 months  Signed, Lauree Chandler, MD 01/14/2021 5:55 AM    [facility] [address], [place], [place]  [address] Phone: [phone]; Fax: [phone]

## 2021-01-14 NOTE — Progress Notes (Signed)
Chief Complaint  Patient presents with  . Follow-up    CAD   History of Present Illness:   75 yo malewith history of CAD s/p PCI of the LAD in 2005, CABG (LIMA to LAD and SVG to OM) in 2018, severe aortic stenosis s/p bioprosthetic AVR in 2018, CVA s/p right carotid endarterectomy with subsequent right internal carotid artery occlusion, Barrett's esophagitis, AAA, HTN, HLD and DM who is here today for cardiac follow up. He underwent 2V CABG and AVR in 2018. Echo June 2019 with LVEF=55-60% with normally functioning bioprosthetic AVR. He was admitted to [facility] May 2021 with fevers and malaise and his blood cultures grew gram positive cocci. TEE 05/01/20 with significant thickening of one of the bioprosthetic leaflets suggestive of endocarditis. He was treated with IV Rocephin and Gentamycin. Echo 07/13/20 with LVEF=55%, moderate LVH, mild MR. There was thickening of one of the leaflets of the bioprosthetic aortic valve but no aortic insufficiency or aortic stenosis. He was seen in our office 12/09/20 with c/o dyspnea with minimal exertion. I arranged labwork and he was found to have pancytopenia with Hgb 7.6, Hct 21.8 and platelets 33,000. He has since been diagnosed with a myelodysplastic disorder and has been followed in the Oncology office by Dr. Marin Olp. Echo 12/11/20 with LVEF=55-60%, trivial MR. Normal function of the bioprosthetic AVR. ASA has been stopped by Hematology.   He is here today for follow up. The patient denies any chest pain, dyspnea, palpitations, lower extremity edema, orthopnea, PND, dizziness, near syncope or syncope.   Primary Care Physician: Karlene Einstein, MD   Past Medical History:  Diagnosis Date  . Aortic stenosis    Echo (08/2017): Severe AS with mean gradient of 42 mmHg.  . Barrett's esophagus   . CAD (coronary artery disease) 06/06/2011   Remote stent to the LAD in 2005 // Last cath in 2010 showing diffuse CAD, small PD that is occluded and fills with left to right  collaterals, 60-70% 1st OM, with normal EF. Managed medically // Myoview 3/18:  Normal perfusion. LVEF 55% with normal wall motion. This is a low risk study.  . Complication of anesthesia   . Diabetes mellitus   . ED (erectile dysfunction)   . Family history of adverse reaction to anesthesia 10/23/2017   sister had history of malignant hyperthermia when she was 35, she is 73 years old now  . GERD (gastroesophageal reflux disease)   . Goals of care, counseling/discussion 12/22/2020  . Hyperlipidemia   . Hypertension   . IHD (ischemic heart disease)    prior stenting of the LAD in December of 2005; with last cath in 2010 showing diffuse CAD with normal LV function. He is managed medically.   . Malignant hyperthermia    sister had reaction concerning for malignant hyperthermia (~ 1988)  . MDS (myelodysplastic syndrome), high grade ([place]) 12/22/2020  . Neuromuscular disorder ([place]) 10/23/2017   lumbar 4 disc in back causing nerve pain, on gabapentin  . Obesity   . Obstructive sleep apnea on CPAP   . PONV (postoperative nausea and vomiting)   . S/P aortic valve replacement with bioprosthetic valve 10/26/2017   25 mm Csf - [place] Ease bovine pericardial tissue valve  . S/P coronary artery stent placement 2005   LAD    Past Surgical History:  Procedure Laterality Date  . AORTIC VALVE REPLACEMENT N/A 10/26/2017   Procedure: AORTIC VALVE REPLACEMENT (AVR), using Magna Ease 25;  Surgeon: Rexene Alberts, MD;  Location: [facility]  OR;  Service: Open Heart Surgery;  Laterality: N/A;  . CARDIAC CATHETERIZATION  2010   NORMAL LV FUNCTION. DIFFUSE CORONARY DISEASE WITH THE DISTAL PORTION OF THE SMALL POSTERIOR DESCENDING VESSEL OCCLUDED AND FILLING WITH THE RIGHT COLLATERALS, 60-70% STENOSIS IN THE FIRST OBTUSE MARGINAL VESSEL AND DIFFUSE SMALL VESSEL DISEASE  . CAROTID ENDARTERECTOMY    . CIRCUMCISION  1985  . CORONARY ARTERY BYPASS GRAFT N/A 10/26/2017   Procedure: CORONARY ARTERY BYPASS GRAFTING (CABG)  x two , using left internal mammary artery and right leg greater saphenous vein harvested endoscopically;  Surgeon: Rexene Alberts, MD;  Location: [place];  Service: Open Heart Surgery;  Laterality: N/A;  . CORONARY STENT PLACEMENT  2005  . IR IMAGING GUIDED PORT INSERTION  12/28/2020  . RIGHT/LEFT HEART CATH AND CORONARY ANGIOGRAPHY N/A 08/25/2017   Procedure: RIGHT/LEFT HEART CATH AND CORONARY ANGIOGRAPHY;  Surgeon: Nelva Bush, MD;  Location: [facility] CV LAB;  Service: Cardiovascular;  Laterality: N/A;  . rotator cuff surgery Right   . TEE WITHOUT CARDIOVERSION N/A 10/26/2017   Procedure: TRANSESOPHAGEAL ECHOCARDIOGRAM (TEE);  Surgeon: Rexene Alberts, MD;  Location: [place];  Service: Open Heart Surgery;  Laterality: N/A;  . TEE WITHOUT CARDIOVERSION N/A 05/01/2020   Procedure: TRANSESOPHAGEAL ECHOCARDIOGRAM (TEE);  Surgeon: Buford Dresser, MD;  Location: [facility] ENDOSCOPY;  Service: Cardiovascular;  Laterality: N/A;    Current Outpatient Medications  Medication Sig Dispense Refill  . acetaminophen (TYLENOL) 500 MG tablet Take 1,000 mg by mouth every 8 (eight) hours as needed for mild pain (or headaches).    [name] amoxicillin (AMOXIL) 500 MG capsule Take 2,000 mg by mouth See admin instructions. Take 2,000 mg by mouth one hour prior to dental appointments    . ascorbic acid (VITAMIN C) 500 MG tablet Take 500 mg by mouth daily with breakfast.    . Cyanocobalamin (VITAMIN B 12) 500 MCG TABS Take 500 mcg by mouth daily.    . ferrous sulfate 325 (65 FE) MG tablet Take 325 mg by mouth daily with breakfast.    . fluticasone (FLONASE) 50 MCG/ACT nasal spray Place 2 sprays into both nostrils in the morning.    . gabapentin (NEURONTIN) 300 MG capsule Take 600 mg by mouth at bedtime.     . lidocaine-prilocaine (EMLA) cream Apply to affected area once 30 g 3  . lisinopril (ZESTRIL) 5 MG tablet TAKE 1 TABLET BY MOUTH DAILY (Patient taking differently: Take 5 mg by mouth in the morning.) 90 tablet 2   . loratadine (CLARITIN) 10 MG tablet Take 10 mg by mouth daily.    . metoprolol succinate (TOPROL-XL) 50 MG 24 hr tablet Take 50 mg by mouth at bedtime.    . Multiple Vitamins-Minerals (ONE-A-DAY 50 PLUS PO) Take 1 tablet by mouth daily with breakfast.    . ondansetron (ZOFRAN) 8 MG tablet Take 1 tablet (8 mg total) by mouth 2 (two) times daily as needed (Nausea or vomiting). 30 tablet 1  . ONETOUCH VERIO test strip 1 each by Other route daily.  3  . polyethylene glycol (MIRALAX / GLYCOLAX) 17 g packet Take 17 g by mouth daily. (Patient taking differently: Take 17 g by mouth every other day.) 14 each 0  . PRILOSEC OTC 20 MG tablet Take 20 mg by mouth daily at 6 PM.    . prochlorperazine (COMPAZINE) 10 MG tablet Take 1 tablet (10 mg total) by mouth every 6 (six) hours as needed (Nausea or vomiting). 30 tablet 1  . rosuvastatin (  CRESTOR) 10 MG tablet Take 10 mg by mouth at bedtime.    . sitaGLIPtin-metformin (JANUMET) 50-1000 MG tablet Take 1 tablet by mouth 2 (two) times daily with a meal.     . nitroGLYCERIN (NITROSTAT) 0.4 MG SL tablet Place 1 tablet (0.4 mg total) under the tongue every 5 (five) minutes as needed for chest pain. 25 tablet 6   No current facility-administered medications for this visit.    Allergies  Allergen Reactions  . Oxycodone Nausea And Vomiting    Social History   Socioeconomic History  . Marital status: Married    Spouse name: Not on file  . Number of children: 3  . Years of education: Not on file  . Highest education level: Not on file  Occupational History  . Occupation: Retired-[employer]  . Smoking status: Former Smoker    Packs/day: 1.00    Years: 20.00    Pack years: 20.00    Types: Cigarettes    Quit date: 12/12/1992    Years since quitting: 28.1  . Smokeless tobacco: Never Used  Vaping Use  . Vaping Use: Never used  Substance and Sexual Activity  . Alcohol use: Yes    Alcohol/week: 1.0 standard drink    Types: 1  Cans of beer per week  . Drug use: No  . Sexual activity: Not Currently  Other Topics Concern  . Not on file  Social History Narrative  . Not on file   Social Determinants of Health   Financial Resource Strain: Not on file  Food Insecurity: Not on file  Transportation Needs: Not on file  Physical Activity: Not on file  Stress: Not on file  Social Connections: Not on file  Intimate Partner Violence: Not on file    Family History  Problem Relation Age of Onset  . Cancer Mother   . Heart attack Father 43  . CAD Brother     Review of Systems:  As stated in the HPI and otherwise negative.   BP 118/70   Pulse 71   Ht 5\' 9"  (1.753 m)   Wt 220 lb 12.8 oz (100.2 kg)   SpO2 96%   BMI 32.61 kg/m   Physical Examination:  General: Well developed, well nourished, NAD  HEENT: OP clear, mucus membranes moist  SKIN: warm, dry. No rashes. Neuro: No focal deficits  Musculoskeletal: Muscle strength 5/5 all ext  Psychiatric: Mood and affect normal  Neck: No JVD, no carotid bruits, no thyromegaly, no lymphadenopathy.  Lungs:Clear bilaterally, no wheezes, rhonci, crackles Cardiovascular: Regular rate and rhythm. No murmurs, gallops or rubs. Abdomen:Soft. Bowel sounds present. Non-tender.  Extremities: No lower extremity edema. Pulses are 2 + in the bilateral DP/PT.  Echo 12/11/20:  1. Left ventricular ejection fraction, by estimation, is 55 to 60%. The  left ventricle has normal function. The left ventricle has no regional  wall motion abnormalities. There is moderate concentric left ventricular  hypertrophy. Left ventricular  diastolic parameters are consistent with Grade II diastolic dysfunction  (pseudonormalization). Elevated left atrial pressure.  2. Right ventricular systolic function is normal. The right ventricular  size is normal. There is normal pulmonary artery systolic pressure.  3. Left atrial size was mildly dilated.  4. Right atrial size was mildly dilated.   5. The mitral valve is normal in structure. Trivial mitral valve  regurgitation.  6. The aortic valve has been repaired/replaced. There is mild thickening  of the aortic valve. Aortic valve regurgitation is  not visualized. No  aortic stenosis is present. There is a 25 mm Magna bioprosthetic valve  present in the aortic position.  Procedure Date: 2018. Echo findings are consistent with normal structure  and function of the aortic valve prosthesis. Aortic valve mean gradient  measures 8.7 mmHg. Aortic valve Vmax measures 2.05 m/s.   Cardiac cath 08/25/17: Findings: 1. Multivessel coronary artery disease including diffuse mild to moderate proximal to mid LAD disease, 50-60% in-stent restenosis of the distal LAD stent, moderate-caliber OM2 branch with 80% proximal disease, and dominant RCA with chronic total occlusion of very small posterior descending artery. Disease predominantly affects small/distal vessels. 2. Severe aortic stenosis with mean transvalvular gradient by pullback of 66 mmHg and peak-to-peak gradient of 71 mmHg. 3. Normal left and right heart filling pressures. 4. Normal Fick cardiac output.  EKG:  EKG is not ordered today. The ekg ordered today demonstrates   Recent Labs: 12/09/2020: NT-Pro BNP 415 01/13/2021: ALT 13; BUN 22; Creatinine 1.33; Hemoglobin 8.1; Platelet Count 17; Potassium 4.1; Sodium 132   Lipid Panel    Component Value Date/Time   CHOL 105 05/07/2013 0854   TRIG 110.0 05/07/2013 0854   HDL 35.80 (L) 05/07/2013 0854   CHOLHDL 3 05/07/2013 0854   VLDL 22.0 05/07/2013 0854   LDLCALC 47 05/07/2013 0854     Wt Readings from Last 3 Encounters:  01/14/21 220 lb 12.8 oz (100.2 kg)  12/28/20 219 lb (99.3 kg)  12/22/20 222 lb (100.7 kg)     Assessment and Plan:   1. Aortic stenosis s/p bioprosthetic AVR:TEE in May 2021 and TTE August 2021 with thickening of one of the leaflets of the bioprosthetic aortic valve. He had been treated for endocarditis in  May 2021. No evidence of valvular insufficiency or stenosis by echo December 2021. Will resume ASA when safe from a Hematology standpoint. Continue SBE prophylaxis as necessary.   2. CAD s/p CABG without angina: He has no chest pain. Normal LV systolic function by echo December 2021. Continue statin. See above regarding ASA  3. Hyperlipidemia: Lipids followed in primary care. Continue statin  4. Carotid artery disease: Followed in vascular surgery office  5. Dyspnea: This is likely due to his anemia secondary to his myelodysplastic disorder. This is being managed in the oncology office. .   Labs/ tests ordered today include:   No orders of the defined types were placed in this encounter.  Disposition:   Follow up with me in 12 months.    Signed, Lauree Chandler, MD 01/14/2021 10:22 AM    [facility] [address], [place], [place]  [address] Phone: [phone]; Fax: [phone]

## 2021-01-14 NOTE — Patient Instructions (Signed)
Medication Instructions:  Your physician recommends that you continue on your current medications as directed. Please refer to the Current Medication list given to you today.  *If you need a refill on your cardiac medications before your next appointment, please call your pharmacy*   Lab Work: none   Testing/Procedures: None ordered   Follow-Up: At [facility], you and your health needs are our priority.  As part of our continuing mission to provide you with exceptional heart care, we have created designated Provider Care Teams.  These Care Teams include your primary Cardiologist (physician) and Advanced Practice Providers (APPs -  Physician Assistants and Nurse Practitioners) who all work together to provide you with the care you need, when you need it.   Your next appointment:   1 year(s)  The format for your next appointment:   In Person  Provider:   Christopher McAlhany, MD    

## 2021-01-18 ENCOUNTER — Encounter: Payer: Self-pay | Admitting: Hematology & Oncology

## 2021-01-18 ENCOUNTER — Telehealth: Payer: Self-pay

## 2021-01-18 NOTE — Telephone Encounter (Signed)
S/w pts wife per inbasket message and a lab only appt has been made

## 2021-01-20 ENCOUNTER — Inpatient Hospital Stay: Payer: PPO

## 2021-01-20 ENCOUNTER — Other Ambulatory Visit: Payer: Self-pay

## 2021-01-20 DIAGNOSIS — D46Z Other myelodysplastic syndromes: Secondary | ICD-10-CM

## 2021-01-20 DIAGNOSIS — Z5111 Encounter for antineoplastic chemotherapy: Secondary | ICD-10-CM | POA: Diagnosis not present

## 2021-01-20 LAB — CBC WITH DIFFERENTIAL (CANCER CENTER ONLY)
Abs Immature Granulocytes: 0 10*3/uL (ref 0.00–0.07)
Basophils Absolute: 0 10*3/uL (ref 0.0–0.1)
Basophils Relative: 0 %
Blasts: 9 %
Eosinophils Absolute: 0 10*3/uL (ref 0.0–0.5)
Eosinophils Relative: 0 %
HCT: 23.4 % — ABNORMAL LOW (ref 39.0–52.0)
Hemoglobin: 7.6 g/dL — ABNORMAL LOW (ref 13.0–17.0)
Lymphocytes Relative: 40 %
Lymphs Abs: 0.5 10*3/uL — ABNORMAL LOW (ref 0.7–4.0)
MCH: 32.5 pg (ref 26.0–34.0)
MCHC: 32.5 g/dL (ref 30.0–36.0)
MCV: 100 fL (ref 80.0–100.0)
Metamyelocytes Relative: 1 %
Monocytes Absolute: 0 10*3/uL — ABNORMAL LOW (ref 0.1–1.0)
Monocytes Relative: 3 %
Neutro Abs: 0.6 10*3/uL — ABNORMAL LOW (ref 1.7–7.7)
Neutrophils Relative %: 47 %
Platelet Count: 27 10*3/uL — ABNORMAL LOW (ref 150–400)
RBC: 2.34 MIL/uL — ABNORMAL LOW (ref 4.22–5.81)
RDW: 19 % — ABNORMAL HIGH (ref 11.5–15.5)
WBC Count: 1.3 10*3/uL — ABNORMAL LOW (ref 4.0–10.5)
nRBC: 1.5 % — ABNORMAL HIGH (ref 0.0–0.2)

## 2021-01-20 LAB — BASIC METABOLIC PANEL - CANCER CENTER ONLY
Anion gap: 9 (ref 5–15)
BUN: 23 mg/dL (ref 8–23)
CO2: 23 mmol/L (ref 22–32)
Calcium: 9.9 mg/dL (ref 8.9–10.3)
Chloride: 100 mmol/L (ref 98–111)
Creatinine: 1.46 mg/dL — ABNORMAL HIGH (ref 0.61–1.24)
GFR, Estimated: 51 mL/min — ABNORMAL LOW (ref >60)
Glucose, Bld: 167 mg/dL — ABNORMAL HIGH (ref 70–99)
Potassium: 4.1 mmol/L (ref 3.5–5.1)
Sodium: 132 mmol/L — ABNORMAL LOW (ref 135–145)

## 2021-01-25 ENCOUNTER — Inpatient Hospital Stay: Payer: PPO | Admitting: Hematology & Oncology

## 2021-01-25 ENCOUNTER — Encounter: Payer: Self-pay | Admitting: Hematology & Oncology

## 2021-01-25 ENCOUNTER — Inpatient Hospital Stay: Payer: PPO

## 2021-01-25 ENCOUNTER — Telehealth: Payer: Self-pay | Admitting: *Deleted

## 2021-01-25 ENCOUNTER — Other Ambulatory Visit: Payer: Self-pay | Admitting: *Deleted

## 2021-01-25 ENCOUNTER — Other Ambulatory Visit: Payer: Self-pay | Admitting: Family

## 2021-01-25 ENCOUNTER — Other Ambulatory Visit: Payer: Self-pay

## 2021-01-25 VITALS — BP 130/61 | HR 74 | Temp 98.8°F | Resp 18 | Wt 221.0 lb

## 2021-01-25 VITALS — BP 116/50 | HR 69

## 2021-01-25 DIAGNOSIS — Z5111 Encounter for antineoplastic chemotherapy: Secondary | ICD-10-CM | POA: Diagnosis not present

## 2021-01-25 DIAGNOSIS — D649 Anemia, unspecified: Secondary | ICD-10-CM

## 2021-01-25 DIAGNOSIS — D46Z Other myelodysplastic syndromes: Secondary | ICD-10-CM

## 2021-01-25 LAB — CBC WITH DIFFERENTIAL (CANCER CENTER ONLY)
Abs Immature Granulocytes: 0.02 10*3/uL (ref 0.00–0.07)
Basophils Absolute: 0 10*3/uL (ref 0.0–0.1)
Basophils Relative: 0 %
Eosinophils Absolute: 0 10*3/uL (ref 0.0–0.5)
Eosinophils Relative: 0 %
HCT: 20.4 % — ABNORMAL LOW (ref 39.0–52.0)
Hemoglobin: 6.6 g/dL — CL (ref 13.0–17.0)
Immature Granulocytes: 2 %
Lymphocytes Relative: 45 %
Lymphs Abs: 0.4 10*3/uL — ABNORMAL LOW (ref 0.7–4.0)
MCH: 32.5 pg (ref 26.0–34.0)
MCHC: 32.4 g/dL (ref 30.0–36.0)
MCV: 100.5 fL — ABNORMAL HIGH (ref 80.0–100.0)
Monocytes Absolute: 0.1 10*3/uL (ref 0.1–1.0)
Monocytes Relative: 12 %
Neutro Abs: 0.4 10*3/uL — CL (ref 1.7–7.7)
Neutrophils Relative %: 41 %
Platelet Count: 26 10*3/uL — ABNORMAL LOW (ref 150–400)
RBC: 2.03 MIL/uL — ABNORMAL LOW (ref 4.22–5.81)
RDW: 18.9 % — ABNORMAL HIGH (ref 11.5–15.5)
WBC Count: 0.9 10*3/uL — CL (ref 4.0–10.5)
nRBC: 2.3 % — ABNORMAL HIGH (ref 0.0–0.2)

## 2021-01-25 LAB — BASIC METABOLIC PANEL - CANCER CENTER ONLY
Anion gap: 9 (ref 5–15)
BUN: 20 mg/dL (ref 8–23)
CO2: 22 mmol/L (ref 22–32)
Calcium: 9.3 mg/dL (ref 8.9–10.3)
Chloride: 99 mmol/L (ref 98–111)
Creatinine: 1.37 mg/dL — ABNORMAL HIGH (ref 0.61–1.24)
GFR, Estimated: 55 mL/min — ABNORMAL LOW (ref >60)
Glucose, Bld: 162 mg/dL — ABNORMAL HIGH (ref 70–99)
Potassium: 4 mmol/L (ref 3.5–5.1)
Sodium: 130 mmol/L — ABNORMAL LOW (ref 135–145)

## 2021-01-25 LAB — PREPARE RBC (CROSSMATCH)

## 2021-01-25 MED ORDER — LORAZEPAM 0.5 MG PO TABS: 0.5000 mg | ORAL_TABLET | Freq: Four times a day (QID) | ORAL | 0 refills | Status: DC | PRN

## 2021-01-25 MED ORDER — PALONOSETRON HCL INJECTION 0.25 MG/5ML
INTRAVENOUS | Status: AC
  Filled 2021-01-25: qty 5

## 2021-01-25 MED ORDER — SODIUM CHLORIDE 0.9 % IV SOLN
75.0000 mg/m2 | Freq: Once | INTRAVENOUS | Status: AC
  Administered 2021-01-25: 165 mg via INTRAVENOUS
  Filled 2021-01-25: qty 16.5

## 2021-01-25 MED ORDER — SODIUM CHLORIDE 0.9 % IV SOLN
Freq: Once | INTRAVENOUS | Status: AC
  Filled 2021-01-25: qty 250

## 2021-01-25 MED ORDER — DEXAMETHASONE 4 MG PO TABS: 8.0000 mg | ORAL_TABLET | Freq: Every day | ORAL | 1 refills | Status: DC

## 2021-01-25 MED ORDER — CHLORHEXIDINE GLUCONATE 0.12 % MT SOLN: 15.0000 mL | Freq: Two times a day (BID) | OROMUCOSAL | 3 refills | Status: DC

## 2021-01-25 MED ORDER — PROCHLORPERAZINE MALEATE 10 MG PO TABS: 10.0000 mg | ORAL_TABLET | Freq: Four times a day (QID) | ORAL | 1 refills | Status: DC | PRN

## 2021-01-25 MED ORDER — SODIUM CHLORIDE 0.9 % IV SOLN
10.0000 mg | Freq: Once | INTRAVENOUS | Status: AC
  Administered 2021-01-25: 10 mg via INTRAVENOUS
  Filled 2021-01-25: qty 10

## 2021-01-25 MED ORDER — SODIUM CHLORIDE 0.9% FLUSH
10.0000 mL | INTRAVENOUS | Status: DC | PRN
Start: 2021-01-25 — End: 2021-01-25
  Administered 2021-01-25: 10 mL
  Filled 2021-01-25: qty 10

## 2021-01-25 MED ORDER — LIDOCAINE-PRILOCAINE 2.5-2.5 % EX CREA: TOPICAL_CREAM | CUTANEOUS | 3 refills | Status: DC

## 2021-01-25 MED ORDER — PALONOSETRON HCL INJECTION 0.25 MG/5ML
0.2500 mg | Freq: Once | INTRAVENOUS | Status: AC
  Administered 2021-01-25: 0.25 mg via INTRAVENOUS

## 2021-01-25 MED ORDER — ONDANSETRON HCL 8 MG PO TABS: 8.0000 mg | ORAL_TABLET | Freq: Two times a day (BID) | ORAL | 1 refills | Status: DC | PRN

## 2021-01-25 MED ORDER — HEPARIN SOD (PORK) LOCK FLUSH 100 UNIT/ML IV SOLN
500.0000 [IU] | Freq: Once | INTRAVENOUS | Status: AC | PRN
  Administered 2021-01-25: 500 [IU]
  Filled 2021-01-25: qty 5

## 2021-01-25 NOTE — Patient Instructions (Signed)
Azacitidine suspension for injection (subcutaneous use) What is this medicine? AZACITIDINE (ay za SITE i deen) is a chemotherapy drug. This medicine reduces the growth of cancer cells and can suppress the immune system. It is used for treating myelodysplastic syndrome or some types of leukemia. This medicine may be used for other purposes; ask your health care provider or pharmacist if you have questions. COMMON BRAND NAME(S): Vidaza What should I tell my health care provider before I take this medicine? They need to know if you have any of these conditions:  kidney disease  liver disease  liver tumors  an unusual or allergic reaction to azacitidine, mannitol, other medicines, foods, dyes, or preservatives  pregnant or trying to get pregnant  breast-feeding How should I use this medicine? This medicine is for injection under the skin. It is administered in a hospital or clinic by a specially trained health care professional. Talk to your pediatrician regarding the use of this medicine in children. While this drug may be prescribed for selected conditions, precautions do apply. Overdosage: If you think you have taken too much of this medicine contact a poison control center or emergency room at once. NOTE: This medicine is only for you. Do not share this medicine with others. What if I miss a dose? It is important not to miss your dose. Call your doctor or health care professional if you are unable to keep an appointment. What may interact with this medicine? Interactions have not been studied. Give your health care provider a list of all the medicines, herbs, non-prescription drugs, or dietary supplements you use. Also tell them if you smoke, drink alcohol, or use illegal drugs. Some items may interact with your medicine. This list may not describe all possible interactions. Give your health care provider a list of all the medicines, herbs, non-prescription drugs, or dietary supplements  you use. Also tell them if you smoke, drink alcohol, or use illegal drugs. Some items may interact with your medicine. What should I watch for while using this medicine? Visit your doctor for checks on your progress. This drug may make you feel generally unwell. This is not uncommon, as chemotherapy can affect healthy cells as well as cancer cells. Report any side effects. Continue your course of treatment even though you feel ill unless your doctor tells you to stop. In some cases, you may be given additional medicines to help with side effects. Follow all directions for their use. Call your doctor or health care professional for advice if you get a fever, chills or sore throat, or other symptoms of a cold or flu. Do not treat yourself. This drug decreases your body's ability to fight infections. Try to avoid being around people who are sick. This medicine may increase your risk to bruise or bleed. Call your doctor or health care professional if you notice any unusual bleeding. You may need blood work done while you are taking this medicine. Do not become pregnant while taking this medicine and for 6 months after the last dose. Women should inform their doctor if they wish to become pregnant or think they might be pregnant. Men should not father a child while taking this medicine and for 3 months after the last dose. There is a potential for serious side effects to an unborn child. Talk to your health care professional or pharmacist for more information. Do not breast-feed an infant while taking this medicine and for 1 week after the last dose. This medicine may interfere with   the ability to have a child. Talk with your doctor or health care professional if you are concerned about your fertility. What side effects may I notice from receiving this medicine? Side effects that you should report to your doctor or health care professional as soon as possible:  allergic reactions like skin rash, itching or  hives, swelling of the face, lips, or tongue  low blood counts - this medicine may decrease the number of white blood cells, red blood cells and platelets. You may be at increased risk for infections and bleeding.  signs of infection - fever or chills, cough, sore throat, pain passing urine  signs of decreased platelets or bleeding - bruising, pinpoint red spots on the skin, black, tarry stools, blood in the urine  signs of decreased red blood cells - unusually weak or tired, fainting spells, lightheadedness  signs and symptoms of kidney injury like trouble passing urine or change in the amount of urine  signs and symptoms of liver injury like dark yellow or brown urine; general ill feeling or flu-like symptoms; light-colored stools; loss of appetite; nausea; right upper belly pain; unusually weak or tired; yellowing of the eyes or skin Side effects that usually do not require medical attention (report to your doctor or health care professional if they continue or are bothersome):  constipation  diarrhea  nausea, vomiting  pain or redness at the injection site  unusually weak or tired This list may not describe all possible side effects. Call your doctor for medical advice about side effects. You may report side effects to FDA at 1-800-FDA-1088. Where should I keep my medicine? This drug is given in a hospital or clinic and will not be stored at home. NOTE: This sheet is a summary. It may not cover all possible information. If you have questions about this medicine, talk to your doctor, pharmacist, or health care provider.  2021 Elsevier/Gold Standard (2016-12-27 14:37:51)  

## 2021-01-25 NOTE — Telephone Encounter (Signed)
Dr. Marin Olp notified of HGB-6.6, WBC-0.9 and ANC-0.4.  Orders received for Theodore Jenkins to get two units of blood per Dr. Marin Olp.  Theodore Jenkins requests to get both units tomorrow, 01/26/21.

## 2021-01-25 NOTE — Progress Notes (Signed)
Hematology and Oncology Follow Up Visit  Theodore Jenkins [id number] 03-11-1946 75 y.o. 01/25/2021   Principle Diagnosis:   MDS -- RAEB-2  --  Normal cytogenetics;  IPSS = 5  Current Therapy:    Vidaza 75 mg/m2 IV q day d1-7  -- start cycle #1 on 01/25/2021     Interim History:  Theodore Jenkins is in for follow-up.  Unfortunately, he developed Covid.  His wife had it.  He tested positive for it.  Because of this, we cannot see him for a few weeks.  He really had no symptoms.  When he has a positive for, I think he just had 2 doses of Vidaza.  This was subcutaneous.  This was quite difficult for him since he required I think for injections with each treatment.  He does have a Port-A-Cath in.  We will now give him Vidaza IV.  He is quite anemic.  He does feel tired.  His hemoglobin is 6.6.  We will go ahead and plan to transfuse him.  I think this will make him feel better.  He has had no problems with rashes.  There has been no mouth sores.  He did have a couple small hematomas in his mouth.  Currently, I would say his performance status is ECOG 1.   Medications:  Current Outpatient Medications:  .  acetaminophen (TYLENOL) 500 MG tablet, Take 1,000 mg by mouth every 8 (eight) hours as needed for mild pain (or headaches)., Disp: , Rfl:  .  amoxicillin (AMOXIL) 500 MG capsule, Take 2,000 mg by mouth See admin instructions. Take 2,000 mg by mouth one hour prior to dental appointments, Disp: , Rfl:  .  ascorbic acid (VITAMIN C) 500 MG tablet, Take 500 mg by mouth daily with breakfast., Disp: , Rfl:  .  Cyanocobalamin (VITAMIN B 12) 500 MCG TABS, Take 500 mcg by mouth daily., Disp: , Rfl:  .  ferrous sulfate 325 (65 FE) MG tablet, Take 325 mg by mouth daily with breakfast., Disp: , Rfl:  .  fluticasone (FLONASE) 50 MCG/ACT nasal spray, Place 2 sprays into both nostrils in the morning., Disp: , Rfl:  .  gabapentin (NEURONTIN) 300 MG capsule, Take 600 mg by mouth at bedtime. , Disp: , Rfl:  .   lidocaine-prilocaine (EMLA) cream, Apply to affected area once, Disp: 30 g, Rfl: 3 .  lisinopril (ZESTRIL) 5 MG tablet, TAKE 1 TABLET BY MOUTH DAILY (Patient taking differently: Take 5 mg by mouth in the morning.), Disp: 90 tablet, Rfl: 2 .  loratadine (CLARITIN) 10 MG tablet, Take 10 mg by mouth daily., Disp: , Rfl:  .  metoprolol succinate (TOPROL-XL) 50 MG 24 hr tablet, Take 50 mg by mouth at bedtime., Disp: , Rfl:  .  Multiple Vitamins-Minerals (ONE-A-DAY 50 PLUS PO), Take 1 tablet by mouth daily with breakfast., Disp: , Rfl:  .  nitroGLYCERIN (NITROSTAT) 0.4 MG SL tablet, Place 1 tablet (0.4 mg total) under the tongue every 5 (five) minutes as needed for chest pain., Disp: 25 tablet, Rfl: 6 .  ondansetron (ZOFRAN) 8 MG tablet, Take 1 tablet (8 mg total) by mouth 2 (two) times daily as needed (Nausea or vomiting)., Disp: 30 tablet, Rfl: 1 .  ONETOUCH VERIO test strip, 1 each by Other route daily., Disp: , Rfl: 3 .  polyethylene glycol (MIRALAX / GLYCOLAX) 17 g packet, Take 17 g by mouth daily. (Patient taking differently: Take 17 g by mouth every other day.), Disp: 14 each, Rfl: 0 .  PRILOSEC OTC 20 MG tablet, Take 20 mg by mouth daily at 6 PM., Disp: , Rfl:  .  prochlorperazine (COMPAZINE) 10 MG tablet, Take 1 tablet (10 mg total) by mouth every 6 (six) hours as needed (Nausea or vomiting)., Disp: 30 tablet, Rfl: 1 .  rosuvastatin (CRESTOR) 10 MG tablet, Take 10 mg by mouth at bedtime., Disp: , Rfl:  .  sitaGLIPtin-metformin (JANUMET) 50-1000 MG tablet, Take 1 tablet by mouth 2 (two) times daily with a meal. , Disp: , Rfl:   Allergies:  Allergies  Allergen Reactions  . Oxycodone Nausea And Vomiting    Past Medical History, Surgical history, Social history, and Family History were reviewed and updated.  Review of Systems: Review of Systems  Constitutional: Negative.   HENT:  Negative.   Eyes: Negative.   Respiratory: Negative.   Cardiovascular: Negative.   Gastrointestinal: Negative.    Endocrine: Negative.   Genitourinary: Negative.    Musculoskeletal: Negative.   Skin: Positive for rash.  Neurological: Negative.   Hematological: Bruises/bleeds easily.  Psychiatric/Behavioral: Negative.     Physical Exam:  weight is 221 lb (100.2 kg). His oral temperature is 98.8 F (37.1 C). His blood pressure is 130/61 and his pulse is 74. His respiration is 18 and oxygen saturation is 100%.   Wt Readings from Last 3 Encounters:  01/25/21 221 lb (100.2 kg)  01/14/21 220 lb 12.8 oz (100.2 kg)  12/28/20 219 lb (99.3 kg)    Physical Exam Vitals reviewed.  HENT:     Head: Normocephalic and atraumatic.  Eyes:     Pupils: Pupils are equal, round, and reactive to light.  Cardiovascular:     Rate and Rhythm: Normal rate and regular rhythm.     Heart sounds: Normal heart sounds.  Pulmonary:     Effort: Pulmonary effort is normal.     Breath sounds: Normal breath sounds.  Abdominal:     General: Bowel sounds are normal.     Palpations: Abdomen is soft.  Musculoskeletal:        General: No tenderness or deformity. Normal range of motion.     Cervical back: Normal range of motion.  Lymphadenopathy:     Cervical: No cervical adenopathy.  Skin:    General: Skin is warm and dry.     Findings: No erythema or rash.     Comments: Skin exam does show some scattered ecchymoses.  I do not see any obvious petechia on his legs.  He has no rashes.  Neurological:     Mental Status: He is alert and oriented to person, place, and time.  Psychiatric:        Behavior: Behavior normal.        Thought Content: Thought content normal.        Judgment: Judgment normal.      Lab Results  Component Value Date   WBC 0.9 (LL) 01/25/2021   HGB 6.6 (LL) 01/25/2021   HCT 20.4 (L) 01/25/2021   MCV 100.5 (H) 01/25/2021   PLT 26 (L) 01/25/2021     Chemistry      Component Value Date/Time   NA 130 (L) 01/25/2021 0831   NA 131 (L) 12/09/2020 1629   K 4.0 01/25/2021 0831   CL 99  01/25/2021 0831   CO2 22 01/25/2021 0831   BUN 20 01/25/2021 0831   BUN 24 12/09/2020 1629   CREATININE 1.37 (H) 01/25/2021 0831      Component Value Date/Time   CALCIUM 9.3 01/25/2021  0831   ALKPHOS 40 01/13/2021 1432   AST 23 01/13/2021 1432   ALT 13 01/13/2021 1432   BILITOT 0.5 01/13/2021 1432      Impression and Plan: Mr. Kuhlman is a very nice 74 year old white male.  He has high-grade myelodysplasia.  This clearly has been present for only about 7 months.  He had lab work done back in May 2021 which looked pretty normal.  He definitely has high-grade myelodysplasia.  He is trying to progress to acute myeloid leukemia.  Hopefully, we will be able to keep him on treatment now.  I feel bad that he had the coronavirus.  I know he had no symptoms but yet we had to abide by the [facility] policies and not have him come in for I think 2 or 3 weeks.  He definitely is pancytopenic.  We will go ahead with the transfusion.  He will get 2 units of blood.  I think this will make him feel better.  We will go ahead with his true first cycle of treatment this week.  We will plan to get him back weekly for lab work.  I think this will be necessary.  I will see him back myself in another 4 weeks.    Volanda Napoleon, MD 2/14/20228:59 AM

## 2021-01-25 NOTE — Progress Notes (Signed)
Reviewed pt labs with Dr. Marin Olp and pt ok to treat with labs from 01/25/2021

## 2021-01-26 ENCOUNTER — Inpatient Hospital Stay: Payer: PPO

## 2021-01-26 ENCOUNTER — Other Ambulatory Visit: Payer: Self-pay | Admitting: Hematology & Oncology

## 2021-01-26 VITALS — BP 122/53 | HR 67 | Temp 98.3°F | Resp 17

## 2021-01-26 DIAGNOSIS — D649 Anemia, unspecified: Secondary | ICD-10-CM

## 2021-01-26 DIAGNOSIS — D46Z Other myelodysplastic syndromes: Secondary | ICD-10-CM

## 2021-01-26 DIAGNOSIS — Z5111 Encounter for antineoplastic chemotherapy: Secondary | ICD-10-CM | POA: Diagnosis not present

## 2021-01-26 MED ORDER — SODIUM CHLORIDE 0.9 % IV SOLN
10.0000 mg | Freq: Once | INTRAVENOUS | Status: AC
  Administered 2021-01-26: 10 mg via INTRAVENOUS
  Filled 2021-01-26: qty 10

## 2021-01-26 MED ORDER — HEPARIN SOD (PORK) LOCK FLUSH 100 UNIT/ML IV SOLN
500.0000 [IU] | Freq: Once | INTRAVENOUS | Status: AC | PRN
  Administered 2021-01-26: 500 [IU]
  Filled 2021-01-26: qty 5

## 2021-01-26 MED ORDER — SODIUM CHLORIDE 0.9 % IV SOLN
Freq: Once | INTRAVENOUS | Status: AC
  Filled 2021-01-26: qty 250

## 2021-01-26 MED ORDER — SODIUM CHLORIDE 0.9% FLUSH
10.0000 mL | INTRAVENOUS | Status: DC | PRN
  Administered 2021-01-26: 10 mL
  Filled 2021-01-26: qty 10

## 2021-01-26 MED ORDER — SODIUM CHLORIDE 0.9 % IV SOLN
75.0000 mg/m2 | Freq: Once | INTRAVENOUS | Status: AC
  Administered 2021-01-26: 165 mg via INTRAVENOUS
  Filled 2021-01-26: qty 16.5

## 2021-01-26 NOTE — Patient Instructions (Signed)
https://www.[facility].org/donate-blood/blood-donation-process/what-happens-to-donated-blood/blood-transfusions/types-of-[email or web address].html"> https://www.hematology.org/education/patients/blood-basics/blood-safety-and-matching"> https://www.nhlbi.nih.gov/health-topics/blood-transfusion">  Blood Transfusion, Adult A blood transfusion is a procedure in which you receive blood or a type of blood cell (blood component) through an IV. You may need a blood transfusion when your blood level is low. This may result from a bleeding disorder, illness, injury, or surgery. The blood may come from a donor. You may also be able to donate blood for yourself (autologous blood donation) before a planned surgery. The blood given in a transfusion is made up of different blood components. You may receive:  Red blood cells. These carry oxygen to the cells in the body.  Platelets. These help your blood to clot.  Plasma. This is the liquid part of your blood. It carries proteins and other substances throughout the body.  White blood cells. These help you fight infections. If you have hemophilia or another clotting disorder, you may also receive other types of blood products. Tell a health care provider about:  Any blood disorders you have.  Any previous reactions you have had during a blood transfusion.  Any allergies you have.  All medicines you are taking, including vitamins, herbs, eye drops, creams, and over-the-counter medicines.  Any surgeries you have had.  Any medical conditions you have, including any recent fever or cold symptoms.  Whether you are pregnant or may be pregnant. What are the risks? Generally, this is a safe procedure. However, problems may occur.  The most common problems include: ? A mild allergic reaction, such as red, swollen areas of skin (hives) and itching. ? Fever or chills. This may be the body's response to new blood cells received. This may occur during or up to 4  hours after the transfusion.  More serious problems may include: ? Transfusion-associated circulatory overload (TACO), or too much fluid in the lungs. This may cause breathing problems. ? A serious allergic reaction, such as difficulty breathing or swelling around the face and lips. ? Transfusion-related acute lung injury (TRALI), which causes breathing difficulty and low oxygen in the blood. This can occur within hours of the transfusion or several days later. ? Iron overload. This can happen after receiving many blood transfusions over a period of time. ? Infection or virus being transmitted. This is rare because donated blood is carefully tested before it is given. ? Hemolytic transfusion reaction. This is rare. It happens when your body's defense system (immune system)tries to attack the new blood cells. Symptoms may include fever, chills, nausea, low blood pressure, and low back or chest pain. ? Transfusion-associated graft-versus-host disease (TAGVHD). This is rare. It happens when donated cells attack your body's healthy tissues. What happens before the procedure? Medicines Ask your health care provider about:  Changing or stopping your regular medicines. This is especially important if you are taking diabetes medicines or blood thinners.  Taking medicines such as aspirin and ibuprofen. These medicines can thin your blood. Do not take these medicines unless your health care provider tells you to take them.  Taking over-the-counter medicines, vitamins, herbs, and supplements. General instructions  Follow instructions from your health care provider about eating and drinking restrictions.  You will have a blood test to determine your blood type. This is necessary to know what kind of blood your body will accept and to match it to the donor blood.  If you are going to have a planned surgery, you may be able to do an autologous blood donation. This may be done in case you need to have a  transfusion.    You will have your temperature, blood pressure, and pulse monitored before the transfusion.  If you have had an allergic reaction to a transfusion in the past, you may be given medicine to help prevent a reaction. This medicine may be given to you by mouth (orally) or through an IV.  Set aside time for the blood transfusion. This procedure generally takes 1-4 hours to complete. What happens during the procedure?  An IV will be inserted into one of your veins.  The bag of donated blood will be attached to your IV. The blood will then enter through your vein.  Your temperature, blood pressure, and pulse will be monitored regularly during the transfusion. This monitoring is done to detect early signs of a transfusion reaction.  Tell your nurse right away if you have any of these symptoms during the transfusion: ? Shortness of breath or trouble breathing. ? Chest or back pain. ? Fever or chills. ? Hives or itching.  If you have any signs or symptoms of a reaction, your transfusion will be stopped and you may be given medicine.  When the transfusion is complete, your IV will be removed.  Pressure may be applied to the IV site for a few minutes.  A bandage (dressing)will be applied. The procedure may vary among health care providers and hospitals.   What happens after the procedure?  Your temperature, blood pressure, pulse, breathing rate, and blood oxygen level will be monitored until you leave the hospital or clinic.  Your blood may be tested to see how you are responding to the transfusion.  You may be warmed with fluids or blankets to maintain a normal body temperature.  If you receive your blood transfusion in an outpatient setting, you will be told whom to contact to report any reactions. Where to find more information For more information on blood transfusions, visit the [employer]: redcross.org Summary  A blood transfusion is a procedure in which you  receive blood or a type of blood cell (blood component) through an IV.  The blood you receive may come from a donor or be donated by yourself (autologous blood donation) before a planned surgery.  The blood given in a transfusion is made up of different blood components. You may receive red blood cells, platelets, plasma, or white blood cells depending on the condition treated.  Your temperature, blood pressure, and pulse will be monitored before, during, and after the transfusion.  After the transfusion, your blood may be tested to see how your body has responded. This information is not intended to replace advice given to you by your health care provider. Make sure you discuss any questions you have with your health care provider. Document Revised: 10/03/2019 Document Reviewed: 05/23/2019 Elsevier Patient Education  2021 [place]. Azacitidine suspension for injection (subcutaneous use) What is this medicine? AZACITIDINE (ay Peter) is a chemotherapy drug. This medicine reduces the growth of cancer cells and can suppress the immune system. It is used for treating myelodysplastic syndrome or some types of leukemia. This medicine may be used for other purposes; ask your health care provider or pharmacist if you have questions. COMMON BRAND NAME(S): Vidaza What should I tell my health care provider before I take this medicine? They need to know if you have any of these conditions:  kidney disease  liver disease  liver tumors  an unusual or allergic reaction to azacitidine, mannitol, other medicines, foods, dyes, or preservatives  pregnant or trying to get  pregnant  breast-feeding How should I use this medicine? This medicine is for injection under the skin. It is administered in a hospital or clinic by a specially trained health care professional. Talk to your pediatrician regarding the use of this medicine in children. While this drug may be prescribed for selected  conditions, precautions do apply. Overdosage: If you think you have taken too much of this medicine contact a poison control center or emergency room at once. NOTE: This medicine is only for you. Do not share this medicine with others. What if I miss a dose? It is important not to miss your dose. Call your doctor or health care professional if you are unable to keep an appointment. What may interact with this medicine? Interactions have not been studied. Give your health care provider a list of all the medicines, herbs, non-prescription drugs, or dietary supplements you use. Also tell them if you smoke, drink alcohol, or use illegal drugs. Some items may interact with your medicine. This list may not describe all possible interactions. Give your health care provider a list of all the medicines, herbs, non-prescription drugs, or dietary supplements you use. Also tell them if you smoke, drink alcohol, or use illegal drugs. Some items may interact with your medicine. What should I watch for while using this medicine? Visit your doctor for checks on your progress. This drug may make you feel generally unwell. This is not uncommon, as chemotherapy can affect healthy cells as well as cancer cells. Report any side effects. Continue your course of treatment even though you feel ill unless your doctor tells you to stop. In some cases, you may be given additional medicines to help with side effects. Follow all directions for their use. Call your doctor or health care professional for advice if you get a fever, chills or sore throat, or other symptoms of a cold or flu. Do not treat yourself. This drug decreases your body's ability to fight infections. Try to avoid being around people who are sick. This medicine may increase your risk to bruise or bleed. Call your doctor or health care professional if you notice any unusual bleeding. You may need blood work done while you are taking this medicine. Do not become  pregnant while taking this medicine and for 6 months after the last dose. Women should inform their doctor if they wish to become pregnant or think they might be pregnant. Men should not father a child while taking this medicine and for 3 months after the last dose. There is a potential for serious side effects to an unborn child. Talk to your health care professional or pharmacist for more information. Do not breast-feed an infant while taking this medicine and for 1 week after the last dose. This medicine may interfere with the ability to have a child. Talk with your doctor or health care professional if you are concerned about your fertility. What side effects may I notice from receiving this medicine? Side effects that you should report to your doctor or health care professional as soon as possible:  allergic reactions like skin rash, itching or hives, swelling of the face, lips, or tongue  low blood counts - this medicine may decrease the number of white blood cells, red blood cells and platelets. You may be at increased risk for infections and bleeding.  signs of infection - fever or chills, cough, sore throat, pain passing urine  signs of decreased platelets or bleeding - bruising, pinpoint red spots on the  skin, black, tarry stools, blood in the urine  signs of decreased red blood cells - unusually weak or tired, fainting spells, lightheadedness  signs and symptoms of kidney injury like trouble passing urine or change in the amount of urine  signs and symptoms of liver injury like dark yellow or brown urine; general ill feeling or flu-like symptoms; light-colored stools; loss of appetite; nausea; right upper belly pain; unusually weak or tired; yellowing of the eyes or skin Side effects that usually do not require medical attention (report to your doctor or health care professional if they continue or are bothersome):  constipation  diarrhea  nausea, vomiting  pain or redness at the  injection site  unusually weak or tired This list may not describe all possible side effects. Call your doctor for medical advice about side effects. You may report side effects to FDA at 1-800-FDA-1088. Where should I keep my medicine? This drug is given in a hospital or clinic and will not be stored at home. NOTE: This sheet is a summary. It may not cover all possible information. If you have questions about this medicine, talk to your doctor, pharmacist, or health care provider.  2021 Elsevier/Gold Standard (2016-12-27 14:37:51)

## 2021-01-27 ENCOUNTER — Other Ambulatory Visit: Payer: Self-pay | Admitting: Hematology & Oncology

## 2021-01-27 ENCOUNTER — Inpatient Hospital Stay: Payer: PPO

## 2021-01-27 ENCOUNTER — Other Ambulatory Visit: Payer: Self-pay

## 2021-01-27 VITALS — BP 124/47 | HR 77 | Temp 98.7°F | Resp 16

## 2021-01-27 DIAGNOSIS — D46Z Other myelodysplastic syndromes: Secondary | ICD-10-CM

## 2021-01-27 DIAGNOSIS — Z5111 Encounter for antineoplastic chemotherapy: Secondary | ICD-10-CM | POA: Diagnosis not present

## 2021-01-27 LAB — TYPE AND SCREEN
ABO/RH(D): A POS
Antibody Screen: NEGATIVE
Unit division: 0
Unit division: 0

## 2021-01-27 LAB — BPAM RBC
Blood Product Expiration Date: 202203062359
Blood Product Expiration Date: 202203062359
ISSUE DATE / TIME: 202202150726
ISSUE DATE / TIME: 202202150726
Unit Type and Rh: 6200
Unit Type and Rh: 6200

## 2021-01-27 MED ORDER — PALONOSETRON HCL INJECTION 0.25 MG/5ML
0.2500 mg | Freq: Once | INTRAVENOUS | Status: AC
  Administered 2021-01-27: 0.25 mg via INTRAVENOUS

## 2021-01-27 MED ORDER — SODIUM CHLORIDE 0.9% FLUSH
10.0000 mL | INTRAVENOUS | Status: DC | PRN
Start: 2021-01-27 — End: 2021-01-27
  Administered 2021-01-27: 10 mL
  Filled 2021-01-27: qty 10

## 2021-01-27 MED ORDER — SODIUM CHLORIDE 0.9 % IV SOLN
10.0000 mg | Freq: Once | INTRAVENOUS | Status: AC
  Administered 2021-01-27: 10 mg via INTRAVENOUS
  Filled 2021-01-27: qty 10

## 2021-01-27 MED ORDER — PALONOSETRON HCL INJECTION 0.25 MG/5ML
INTRAVENOUS | Status: AC
  Filled 2021-01-27: qty 5

## 2021-01-27 MED ORDER — SODIUM CHLORIDE 0.9 % IV SOLN
Freq: Once | INTRAVENOUS | Status: AC
  Filled 2021-01-27: qty 250

## 2021-01-27 MED ORDER — HEPARIN SOD (PORK) LOCK FLUSH 100 UNIT/ML IV SOLN
500.0000 [IU] | Freq: Once | INTRAVENOUS | Status: AC | PRN
  Administered 2021-01-27: 500 [IU]
  Filled 2021-01-27: qty 5

## 2021-01-27 MED ORDER — SODIUM CHLORIDE 0.9 % IV SOLN
75.0000 mg/m2 | Freq: Once | INTRAVENOUS | Status: AC
  Administered 2021-01-27: 165 mg via INTRAVENOUS
  Filled 2021-01-27: qty 16.5

## 2021-01-27 NOTE — Patient Instructions (Signed)
Azacitidine suspension for injection (subcutaneous use) What is this medicine? AZACITIDINE (ay za SITE i deen) is a chemotherapy drug. This medicine reduces the growth of cancer cells and can suppress the immune system. It is used for treating myelodysplastic syndrome or some types of leukemia. This medicine may be used for other purposes; ask your health care provider or pharmacist if you have questions. COMMON BRAND NAME(S): Vidaza What should I tell my health care provider before I take this medicine? They need to know if you have any of these conditions:  kidney disease  liver disease  liver tumors  an unusual or allergic reaction to azacitidine, mannitol, other medicines, foods, dyes, or preservatives  pregnant or trying to get pregnant  breast-feeding How should I use this medicine? This medicine is for injection under the skin. It is administered in a hospital or clinic by a specially trained health care professional. Talk to your pediatrician regarding the use of this medicine in children. While this drug may be prescribed for selected conditions, precautions do apply. Overdosage: If you think you have taken too much of this medicine contact a poison control center or emergency room at once. NOTE: This medicine is only for you. Do not share this medicine with others. What if I miss a dose? It is important not to miss your dose. Call your doctor or health care professional if you are unable to keep an appointment. What may interact with this medicine? Interactions have not been studied. Give your health care provider a list of all the medicines, herbs, non-prescription drugs, or dietary supplements you use. Also tell them if you smoke, drink alcohol, or use illegal drugs. Some items may interact with your medicine. This list may not describe all possible interactions. Give your health care provider a list of all the medicines, herbs, non-prescription drugs, or dietary supplements  you use. Also tell them if you smoke, drink alcohol, or use illegal drugs. Some items may interact with your medicine. What should I watch for while using this medicine? Visit your doctor for checks on your progress. This drug may make you feel generally unwell. This is not uncommon, as chemotherapy can affect healthy cells as well as cancer cells. Report any side effects. Continue your course of treatment even though you feel ill unless your doctor tells you to stop. In some cases, you may be given additional medicines to help with side effects. Follow all directions for their use. Call your doctor or health care professional for advice if you get a fever, chills or sore throat, or other symptoms of a cold or flu. Do not treat yourself. This drug decreases your body's ability to fight infections. Try to avoid being around people who are sick. This medicine may increase your risk to bruise or bleed. Call your doctor or health care professional if you notice any unusual bleeding. You may need blood work done while you are taking this medicine. Do not become pregnant while taking this medicine and for 6 months after the last dose. Women should inform their doctor if they wish to become pregnant or think they might be pregnant. Men should not father a child while taking this medicine and for 3 months after the last dose. There is a potential for serious side effects to an unborn child. Talk to your health care professional or pharmacist for more information. Do not breast-feed an infant while taking this medicine and for 1 week after the last dose. This medicine may interfere with   the ability to have a child. Talk with your doctor or health care professional if you are concerned about your fertility. What side effects may I notice from receiving this medicine? Side effects that you should report to your doctor or health care professional as soon as possible:  allergic reactions like skin rash, itching or  hives, swelling of the face, lips, or tongue  low blood counts - this medicine may decrease the number of white blood cells, red blood cells and platelets. You may be at increased risk for infections and bleeding.  signs of infection - fever or chills, cough, sore throat, pain passing urine  signs of decreased platelets or bleeding - bruising, pinpoint red spots on the skin, black, tarry stools, blood in the urine  signs of decreased red blood cells - unusually weak or tired, fainting spells, lightheadedness  signs and symptoms of kidney injury like trouble passing urine or change in the amount of urine  signs and symptoms of liver injury like dark yellow or brown urine; general ill feeling or flu-like symptoms; light-colored stools; loss of appetite; nausea; right upper belly pain; unusually weak or tired; yellowing of the eyes or skin Side effects that usually do not require medical attention (report to your doctor or health care professional if they continue or are bothersome):  constipation  diarrhea  nausea, vomiting  pain or redness at the injection site  unusually weak or tired This list may not describe all possible side effects. Call your doctor for medical advice about side effects. You may report side effects to FDA at 1-800-FDA-1088. Where should I keep my medicine? This drug is given in a hospital or clinic and will not be stored at home. NOTE: This sheet is a summary. It may not cover all possible information. If you have questions about this medicine, talk to your doctor, pharmacist, or health care provider.  2021 Elsevier/Gold Standard (2016-12-27 14:37:51)  

## 2021-01-28 ENCOUNTER — Inpatient Hospital Stay: Payer: PPO

## 2021-01-28 VITALS — BP 110/42 | HR 65 | Temp 98.7°F | Resp 17

## 2021-01-28 DIAGNOSIS — D46Z Other myelodysplastic syndromes: Secondary | ICD-10-CM

## 2021-01-28 DIAGNOSIS — Z5111 Encounter for antineoplastic chemotherapy: Secondary | ICD-10-CM | POA: Diagnosis not present

## 2021-01-28 MED ORDER — SODIUM CHLORIDE 0.9 % IV SOLN
10.0000 mg | Freq: Once | INTRAVENOUS | Status: AC
  Administered 2021-01-28: 10 mg via INTRAVENOUS
  Filled 2021-01-28: qty 10

## 2021-01-28 MED ORDER — HEPARIN SOD (PORK) LOCK FLUSH 100 UNIT/ML IV SOLN
500.0000 [IU] | Freq: Once | INTRAVENOUS | Status: AC | PRN
  Administered 2021-01-28: 500 [IU]
  Filled 2021-01-28: qty 5

## 2021-01-28 MED ORDER — SODIUM CHLORIDE 0.9 % IV SOLN
75.0000 mg/m2 | Freq: Once | INTRAVENOUS | Status: AC
  Administered 2021-01-28: 165 mg via INTRAVENOUS
  Filled 2021-01-28: qty 16.5

## 2021-01-28 MED ORDER — SODIUM CHLORIDE 0.9% FLUSH
10.0000 mL | INTRAVENOUS | Status: DC | PRN
  Administered 2021-01-28: 10 mL
  Filled 2021-01-28: qty 10

## 2021-01-28 MED ORDER — SODIUM CHLORIDE 0.9 % IV SOLN
Freq: Once | INTRAVENOUS | Status: AC
Start: 2021-01-28 — End: 2021-01-28
  Filled 2021-01-28: qty 250

## 2021-01-28 NOTE — Patient Instructions (Signed)
Azacitidine suspension for injection (subcutaneous use) What is this medicine? AZACITIDINE (ay za SITE i deen) is a chemotherapy drug. This medicine reduces the growth of cancer cells and can suppress the immune system. It is used for treating myelodysplastic syndrome or some types of leukemia. This medicine may be used for other purposes; ask your health care provider or pharmacist if you have questions. COMMON BRAND NAME(S): Vidaza What should I tell my health care provider before I take this medicine? They need to know if you have any of these conditions:  kidney disease  liver disease  liver tumors  an unusual or allergic reaction to azacitidine, mannitol, other medicines, foods, dyes, or preservatives  pregnant or trying to get pregnant  breast-feeding How should I use this medicine? This medicine is for injection under the skin. It is administered in a hospital or clinic by a specially trained health care professional. Talk to your pediatrician regarding the use of this medicine in children. While this drug may be prescribed for selected conditions, precautions do apply. Overdosage: If you think you have taken too much of this medicine contact a poison control center or emergency room at once. NOTE: This medicine is only for you. Do not share this medicine with others. What if I miss a dose? It is important not to miss your dose. Call your doctor or health care professional if you are unable to keep an appointment. What may interact with this medicine? Interactions have not been studied. Give your health care provider a list of all the medicines, herbs, non-prescription drugs, or dietary supplements you use. Also tell them if you smoke, drink alcohol, or use illegal drugs. Some items may interact with your medicine. This list may not describe all possible interactions. Give your health care provider a list of all the medicines, herbs, non-prescription drugs, or dietary supplements  you use. Also tell them if you smoke, drink alcohol, or use illegal drugs. Some items may interact with your medicine. What should I watch for while using this medicine? Visit your doctor for checks on your progress. This drug may make you feel generally unwell. This is not uncommon, as chemotherapy can affect healthy cells as well as cancer cells. Report any side effects. Continue your course of treatment even though you feel ill unless your doctor tells you to stop. In some cases, you may be given additional medicines to help with side effects. Follow all directions for their use. Call your doctor or health care professional for advice if you get a fever, chills or sore throat, or other symptoms of a cold or flu. Do not treat yourself. This drug decreases your body's ability to fight infections. Try to avoid being around people who are sick. This medicine may increase your risk to bruise or bleed. Call your doctor or health care professional if you notice any unusual bleeding. You may need blood work done while you are taking this medicine. Do not become pregnant while taking this medicine and for 6 months after the last dose. Women should inform their doctor if they wish to become pregnant or think they might be pregnant. Men should not father a child while taking this medicine and for 3 months after the last dose. There is a potential for serious side effects to an unborn child. Talk to your health care professional or pharmacist for more information. Do not breast-feed an infant while taking this medicine and for 1 week after the last dose. This medicine may interfere with   the ability to have a child. Talk with your doctor or health care professional if you are concerned about your fertility. What side effects may I notice from receiving this medicine? Side effects that you should report to your doctor or health care professional as soon as possible:  allergic reactions like skin rash, itching or  hives, swelling of the face, lips, or tongue  low blood counts - this medicine may decrease the number of white blood cells, red blood cells and platelets. You may be at increased risk for infections and bleeding.  signs of infection - fever or chills, cough, sore throat, pain passing urine  signs of decreased platelets or bleeding - bruising, pinpoint red spots on the skin, black, tarry stools, blood in the urine  signs of decreased red blood cells - unusually weak or tired, fainting spells, lightheadedness  signs and symptoms of kidney injury like trouble passing urine or change in the amount of urine  signs and symptoms of liver injury like dark yellow or brown urine; general ill feeling or flu-like symptoms; light-colored stools; loss of appetite; nausea; right upper belly pain; unusually weak or tired; yellowing of the eyes or skin Side effects that usually do not require medical attention (report to your doctor or health care professional if they continue or are bothersome):  constipation  diarrhea  nausea, vomiting  pain or redness at the injection site  unusually weak or tired This list may not describe all possible side effects. Call your doctor for medical advice about side effects. You may report side effects to FDA at 1-800-FDA-1088. Where should I keep my medicine? This drug is given in a hospital or clinic and will not be stored at home. NOTE: This sheet is a summary. It may not cover all possible information. If you have questions about this medicine, talk to your doctor, pharmacist, or health care provider.  2021 Elsevier/Gold Standard (2016-12-27 14:37:51)  

## 2021-01-29 ENCOUNTER — Inpatient Hospital Stay: Payer: PPO

## 2021-01-29 ENCOUNTER — Other Ambulatory Visit: Payer: Self-pay

## 2021-01-29 VITALS — BP 116/56 | HR 71 | Temp 98.9°F | Resp 18

## 2021-01-29 DIAGNOSIS — I10 Essential (primary) hypertension: Secondary | ICD-10-CM | POA: Diagnosis not present

## 2021-01-29 DIAGNOSIS — E7800 Pure hypercholesterolemia, unspecified: Secondary | ICD-10-CM | POA: Diagnosis not present

## 2021-01-29 DIAGNOSIS — Z5111 Encounter for antineoplastic chemotherapy: Secondary | ICD-10-CM | POA: Diagnosis not present

## 2021-01-29 DIAGNOSIS — D46Z Other myelodysplastic syndromes: Secondary | ICD-10-CM

## 2021-01-29 DIAGNOSIS — E118 Type 2 diabetes mellitus with unspecified complications: Secondary | ICD-10-CM | POA: Diagnosis not present

## 2021-01-29 MED ORDER — PALONOSETRON HCL INJECTION 0.25 MG/5ML
INTRAVENOUS | Status: AC
  Filled 2021-01-29: qty 5

## 2021-01-29 MED ORDER — SODIUM CHLORIDE 0.9 % IV SOLN
75.0000 mg/m2 | Freq: Once | INTRAVENOUS | Status: AC
  Administered 2021-01-29: 165 mg via INTRAVENOUS
  Filled 2021-01-29: qty 16.5

## 2021-01-29 MED ORDER — SODIUM CHLORIDE 0.9 % IV SOLN
Freq: Once | INTRAVENOUS | Status: AC
  Filled 2021-01-29: qty 250

## 2021-01-29 MED ORDER — PALONOSETRON HCL INJECTION 0.25 MG/5ML
0.2500 mg | Freq: Once | INTRAVENOUS | Status: AC
  Administered 2021-01-29: 0.25 mg via INTRAVENOUS

## 2021-01-29 MED ORDER — SODIUM CHLORIDE 0.9 % IV SOLN
10.0000 mg | Freq: Once | INTRAVENOUS | Status: AC
  Administered 2021-01-29: 10 mg via INTRAVENOUS
  Filled 2021-01-29: qty 10

## 2021-01-29 MED ORDER — SODIUM CHLORIDE 0.9% FLUSH
10.0000 mL | INTRAVENOUS | Status: DC | PRN
  Administered 2021-01-29: 10 mL
  Filled 2021-01-29: qty 10

## 2021-01-29 MED ORDER — HEPARIN SOD (PORK) LOCK FLUSH 100 UNIT/ML IV SOLN
500.0000 [IU] | Freq: Once | INTRAVENOUS | Status: AC | PRN
  Administered 2021-01-29: 500 [IU]
  Filled 2021-01-29: qty 5

## 2021-01-29 NOTE — Patient Instructions (Signed)
Azacitidine suspension for injection (subcutaneous use) What is this medicine? AZACITIDINE (ay za SITE i deen) is a chemotherapy drug. This medicine reduces the growth of cancer cells and can suppress the immune system. It is used for treating myelodysplastic syndrome or some types of leukemia. This medicine may be used for other purposes; ask your health care provider or pharmacist if you have questions. COMMON BRAND NAME(S): Vidaza What should I tell my health care provider before I take this medicine? They need to know if you have any of these conditions:  kidney disease  liver disease  liver tumors  an unusual or allergic reaction to azacitidine, mannitol, other medicines, foods, dyes, or preservatives  pregnant or trying to get pregnant  breast-feeding How should I use this medicine? This medicine is for injection under the skin. It is administered in a hospital or clinic by a specially trained health care professional. Talk to your pediatrician regarding the use of this medicine in children. While this drug may be prescribed for selected conditions, precautions do apply. Overdosage: If you think you have taken too much of this medicine contact a poison control center or emergency room at once. NOTE: This medicine is only for you. Do not share this medicine with others. What if I miss a dose? It is important not to miss your dose. Call your doctor or health care professional if you are unable to keep an appointment. What may interact with this medicine? Interactions have not been studied. Give your health care provider a list of all the medicines, herbs, non-prescription drugs, or dietary supplements you use. Also tell them if you smoke, drink alcohol, or use illegal drugs. Some items may interact with your medicine. This list may not describe all possible interactions. Give your health care provider a list of all the medicines, herbs, non-prescription drugs, or dietary supplements  you use. Also tell them if you smoke, drink alcohol, or use illegal drugs. Some items may interact with your medicine. What should I watch for while using this medicine? Visit your doctor for checks on your progress. This drug may make you feel generally unwell. This is not uncommon, as chemotherapy can affect healthy cells as well as cancer cells. Report any side effects. Continue your course of treatment even though you feel ill unless your doctor tells you to stop. In some cases, you may be given additional medicines to help with side effects. Follow all directions for their use. Call your doctor or health care professional for advice if you get a fever, chills or sore throat, or other symptoms of a cold or flu. Do not treat yourself. This drug decreases your body's ability to fight infections. Try to avoid being around people who are sick. This medicine may increase your risk to bruise or bleed. Call your doctor or health care professional if you notice any unusual bleeding. You may need blood work done while you are taking this medicine. Do not become pregnant while taking this medicine and for 6 months after the last dose. Women should inform their doctor if they wish to become pregnant or think they might be pregnant. Men should not father a child while taking this medicine and for 3 months after the last dose. There is a potential for serious side effects to an unborn child. Talk to your health care professional or pharmacist for more information. Do not breast-feed an infant while taking this medicine and for 1 week after the last dose. This medicine may interfere with   the ability to have a child. Talk with your doctor or health care professional if you are concerned about your fertility. What side effects may I notice from receiving this medicine? Side effects that you should report to your doctor or health care professional as soon as possible:  allergic reactions like skin rash, itching or  hives, swelling of the face, lips, or tongue  low blood counts - this medicine may decrease the number of white blood cells, red blood cells and platelets. You may be at increased risk for infections and bleeding.  signs of infection - fever or chills, cough, sore throat, pain passing urine  signs of decreased platelets or bleeding - bruising, pinpoint red spots on the skin, black, tarry stools, blood in the urine  signs of decreased red blood cells - unusually weak or tired, fainting spells, lightheadedness  signs and symptoms of kidney injury like trouble passing urine or change in the amount of urine  signs and symptoms of liver injury like dark yellow or brown urine; general ill feeling or flu-like symptoms; light-colored stools; loss of appetite; nausea; right upper belly pain; unusually weak or tired; yellowing of the eyes or skin Side effects that usually do not require medical attention (report to your doctor or health care professional if they continue or are bothersome):  constipation  diarrhea  nausea, vomiting  pain or redness at the injection site  unusually weak or tired This list may not describe all possible side effects. Call your doctor for medical advice about side effects. You may report side effects to FDA at 1-800-FDA-1088. Where should I keep my medicine? This drug is given in a hospital or clinic and will not be stored at home. NOTE: This sheet is a summary. It may not cover all possible information. If you have questions about this medicine, talk to your doctor, pharmacist, or health care provider.  2021 Elsevier/Gold Standard (2016-12-27 14:37:51)  

## 2021-02-01 ENCOUNTER — Inpatient Hospital Stay: Payer: PPO

## 2021-02-01 ENCOUNTER — Other Ambulatory Visit: Payer: Self-pay | Admitting: *Deleted

## 2021-02-01 ENCOUNTER — Telehealth: Payer: Self-pay | Admitting: *Deleted

## 2021-02-01 ENCOUNTER — Other Ambulatory Visit: Payer: Self-pay

## 2021-02-01 DIAGNOSIS — Z5111 Encounter for antineoplastic chemotherapy: Secondary | ICD-10-CM | POA: Diagnosis not present

## 2021-02-01 DIAGNOSIS — D46Z Other myelodysplastic syndromes: Secondary | ICD-10-CM

## 2021-02-01 DIAGNOSIS — D649 Anemia, unspecified: Secondary | ICD-10-CM

## 2021-02-01 LAB — SAMPLE TO BLOOD BANK

## 2021-02-01 LAB — CBC WITH DIFFERENTIAL (CANCER CENTER ONLY)
Abs Immature Granulocytes: 0.07 10*3/uL (ref 0.00–0.07)
Basophils Absolute: 0 10*3/uL (ref 0.0–0.1)
Basophils Relative: 0 %
Eosinophils Absolute: 0 10*3/uL (ref 0.0–0.5)
Eosinophils Relative: 0 %
HCT: 25.6 % — ABNORMAL LOW (ref 39.0–52.0)
Hemoglobin: 8.7 g/dL — ABNORMAL LOW (ref 13.0–17.0)
Immature Granulocytes: 9 %
Lymphocytes Relative: 47 %
Lymphs Abs: 0.4 10*3/uL — ABNORMAL LOW (ref 0.7–4.0)
MCH: 32.1 pg (ref 26.0–34.0)
MCHC: 34 g/dL (ref 30.0–36.0)
MCV: 94.5 fL (ref 80.0–100.0)
Monocytes Absolute: 0.1 10*3/uL (ref 0.1–1.0)
Monocytes Relative: 9 %
Neutro Abs: 0.3 10*3/uL — CL (ref 1.7–7.7)
Neutrophils Relative %: 35 %
Platelet Count: 19 10*3/uL — ABNORMAL LOW (ref 150–400)
RBC: 2.71 MIL/uL — ABNORMAL LOW (ref 4.22–5.81)
RDW: 18.2 % — ABNORMAL HIGH (ref 11.5–15.5)
WBC Count: 0.8 10*3/uL — CL (ref 4.0–10.5)
nRBC: 3.8 % — ABNORMAL HIGH (ref 0.0–0.2)

## 2021-02-01 MED ORDER — SODIUM CHLORIDE 0.9 % IV SOLN
10.0000 mg | Freq: Once | INTRAVENOUS | Status: AC
  Administered 2021-02-01: 10 mg via INTRAVENOUS
  Filled 2021-02-01: qty 10

## 2021-02-01 MED ORDER — SODIUM CHLORIDE 0.9 % IV SOLN
Freq: Once | INTRAVENOUS | Status: AC
  Filled 2021-02-01: qty 250

## 2021-02-01 MED ORDER — PALONOSETRON HCL INJECTION 0.25 MG/5ML
INTRAVENOUS | Status: AC
  Filled 2021-02-01: qty 5

## 2021-02-01 MED ORDER — HEPARIN SOD (PORK) LOCK FLUSH 100 UNIT/ML IV SOLN
500.0000 [IU] | Freq: Once | INTRAVENOUS | Status: AC | PRN
  Administered 2021-02-01: 500 [IU]
  Filled 2021-02-01: qty 5

## 2021-02-01 MED ORDER — PALONOSETRON HCL INJECTION 0.25 MG/5ML
0.2500 mg | Freq: Once | INTRAVENOUS | Status: AC
  Administered 2021-02-01: 0.25 mg via INTRAVENOUS

## 2021-02-01 MED ORDER — SODIUM CHLORIDE 0.9 % IV SOLN
75.0000 mg/m2 | Freq: Once | INTRAVENOUS | Status: AC
  Administered 2021-02-01: 165 mg via INTRAVENOUS
  Filled 2021-02-01: qty 16.5

## 2021-02-01 MED ORDER — SODIUM CHLORIDE 0.9% FLUSH
10.0000 mL | INTRAVENOUS | Status: DC | PRN
  Administered 2021-02-01: 10 mL
  Filled 2021-02-01: qty 10

## 2021-02-01 NOTE — Patient Instructions (Signed)
Azacitidine suspension for injection (subcutaneous use) What is this medicine? AZACITIDINE (ay za SITE i deen) is a chemotherapy drug. This medicine reduces the growth of cancer cells and can suppress the immune system. It is used for treating myelodysplastic syndrome or some types of leukemia. This medicine may be used for other purposes; ask your health care provider or pharmacist if you have questions. COMMON BRAND NAME(S): Vidaza What should I tell my health care provider before I take this medicine? They need to know if you have any of these conditions:  kidney disease  liver disease  liver tumors  an unusual or allergic reaction to azacitidine, mannitol, other medicines, foods, dyes, or preservatives  pregnant or trying to get pregnant  breast-feeding How should I use this medicine? This medicine is for injection under the skin. It is administered in a hospital or clinic by a specially trained health care professional. Talk to your pediatrician regarding the use of this medicine in children. While this drug may be prescribed for selected conditions, precautions do apply. Overdosage: If you think you have taken too much of this medicine contact a poison control center or emergency room at once. NOTE: This medicine is only for you. Do not share this medicine with others. What if I miss a dose? It is important not to miss your dose. Call your doctor or health care professional if you are unable to keep an appointment. What may interact with this medicine? Interactions have not been studied. Give your health care provider a list of all the medicines, herbs, non-prescription drugs, or dietary supplements you use. Also tell them if you smoke, drink alcohol, or use illegal drugs. Some items may interact with your medicine. This list may not describe all possible interactions. Give your health care provider a list of all the medicines, herbs, non-prescription drugs, or dietary supplements  you use. Also tell them if you smoke, drink alcohol, or use illegal drugs. Some items may interact with your medicine. What should I watch for while using this medicine? Visit your doctor for checks on your progress. This drug may make you feel generally unwell. This is not uncommon, as chemotherapy can affect healthy cells as well as cancer cells. Report any side effects. Continue your course of treatment even though you feel ill unless your doctor tells you to stop. In some cases, you may be given additional medicines to help with side effects. Follow all directions for their use. Call your doctor or health care professional for advice if you get a fever, chills or sore throat, or other symptoms of a cold or flu. Do not treat yourself. This drug decreases your body's ability to fight infections. Try to avoid being around people who are sick. This medicine may increase your risk to bruise or bleed. Call your doctor or health care professional if you notice any unusual bleeding. You may need blood work done while you are taking this medicine. Do not become pregnant while taking this medicine and for 6 months after the last dose. Women should inform their doctor if they wish to become pregnant or think they might be pregnant. Men should not father a child while taking this medicine and for 3 months after the last dose. There is a potential for serious side effects to an unborn child. Talk to your health care professional or pharmacist for more information. Do not breast-feed an infant while taking this medicine and for 1 week after the last dose. This medicine may interfere with   the ability to have a child. Talk with your doctor or health care professional if you are concerned about your fertility. What side effects may I notice from receiving this medicine? Side effects that you should report to your doctor or health care professional as soon as possible:  allergic reactions like skin rash, itching or  hives, swelling of the face, lips, or tongue  low blood counts - this medicine may decrease the number of white blood cells, red blood cells and platelets. You may be at increased risk for infections and bleeding.  signs of infection - fever or chills, cough, sore throat, pain passing urine  signs of decreased platelets or bleeding - bruising, pinpoint red spots on the skin, black, tarry stools, blood in the urine  signs of decreased red blood cells - unusually weak or tired, fainting spells, lightheadedness  signs and symptoms of kidney injury like trouble passing urine or change in the amount of urine  signs and symptoms of liver injury like dark yellow or brown urine; general ill feeling or flu-like symptoms; light-colored stools; loss of appetite; nausea; right upper belly pain; unusually weak or tired; yellowing of the eyes or skin Side effects that usually do not require medical attention (report to your doctor or health care professional if they continue or are bothersome):  constipation  diarrhea  nausea, vomiting  pain or redness at the injection site  unusually weak or tired This list may not describe all possible side effects. Call your doctor for medical advice about side effects. You may report side effects to FDA at 1-800-FDA-1088. Where should I keep my medicine? This drug is given in a hospital or clinic and will not be stored at home. NOTE: This sheet is a summary. It may not cover all possible information. If you have questions about this medicine, talk to your doctor, pharmacist, or health care provider.  2021 Elsevier/Gold Standard (2016-12-27 14:37:51)  

## 2021-02-01 NOTE — Telephone Encounter (Signed)
Dr. Marin Olp notified of WBC-0.8 and ANC-0.3.  OK to treat pt today per order of Dr. Marin Olp.  No new orders received at this time.

## 2021-02-01 NOTE — Progress Notes (Signed)
Labs reviewed by MD " ok to treat, despite counts."

## 2021-02-02 ENCOUNTER — Inpatient Hospital Stay: Payer: PPO

## 2021-02-02 ENCOUNTER — Other Ambulatory Visit: Payer: Self-pay

## 2021-02-02 VITALS — BP 127/57 | HR 79 | Temp 98.8°F | Resp 18

## 2021-02-02 DIAGNOSIS — D46Z Other myelodysplastic syndromes: Secondary | ICD-10-CM

## 2021-02-02 DIAGNOSIS — Z5111 Encounter for antineoplastic chemotherapy: Secondary | ICD-10-CM | POA: Diagnosis not present

## 2021-02-02 MED ORDER — SODIUM CHLORIDE 0.9 % IV SOLN
10.0000 mg | Freq: Once | INTRAVENOUS | Status: AC
  Administered 2021-02-02: 10 mg via INTRAVENOUS
  Filled 2021-02-02: qty 10

## 2021-02-02 MED ORDER — SODIUM CHLORIDE 0.9 % IV SOLN
Freq: Once | INTRAVENOUS | Status: AC
  Filled 2021-02-02: qty 250

## 2021-02-02 MED ORDER — SODIUM CHLORIDE 0.9% FLUSH
10.0000 mL | INTRAVENOUS | Status: DC | PRN
Start: 2021-02-02 — End: 2021-02-02
  Administered 2021-02-02: 10 mL
  Filled 2021-02-02: qty 10

## 2021-02-02 MED ORDER — SODIUM CHLORIDE 0.9 % IV SOLN
75.0000 mg/m2 | Freq: Once | INTRAVENOUS | Status: AC
  Administered 2021-02-02: 165 mg via INTRAVENOUS
  Filled 2021-02-02: qty 16.5

## 2021-02-02 MED ORDER — HEPARIN SOD (PORK) LOCK FLUSH 100 UNIT/ML IV SOLN
500.0000 [IU] | Freq: Once | INTRAVENOUS | Status: AC | PRN
  Administered 2021-02-02: 500 [IU]
  Filled 2021-02-02: qty 5

## 2021-02-02 NOTE — Patient Instructions (Signed)
Azacitidine suspension for injection (subcutaneous use) What is this medicine? AZACITIDINE (ay za SITE i deen) is a chemotherapy drug. This medicine reduces the growth of cancer cells and can suppress the immune system. It is used for treating myelodysplastic syndrome or some types of leukemia. This medicine may be used for other purposes; ask your health care provider or pharmacist if you have questions. COMMON BRAND NAME(S): Vidaza What should I tell my health care provider before I take this medicine? They need to know if you have any of these conditions:  kidney disease  liver disease  liver tumors  an unusual or allergic reaction to azacitidine, mannitol, other medicines, foods, dyes, or preservatives  pregnant or trying to get pregnant  breast-feeding How should I use this medicine? This medicine is for injection under the skin. It is administered in a hospital or clinic by a specially trained health care professional. Talk to your pediatrician regarding the use of this medicine in children. While this drug may be prescribed for selected conditions, precautions do apply. Overdosage: If you think you have taken too much of this medicine contact a poison control center or emergency room at once. NOTE: This medicine is only for you. Do not share this medicine with others. What if I miss a dose? It is important not to miss your dose. Call your doctor or health care professional if you are unable to keep an appointment. What may interact with this medicine? Interactions have not been studied. Give your health care provider a list of all the medicines, herbs, non-prescription drugs, or dietary supplements you use. Also tell them if you smoke, drink alcohol, or use illegal drugs. Some items may interact with your medicine. This list may not describe all possible interactions. Give your health care provider a list of all the medicines, herbs, non-prescription drugs, or dietary supplements  you use. Also tell them if you smoke, drink alcohol, or use illegal drugs. Some items may interact with your medicine. What should I watch for while using this medicine? Visit your doctor for checks on your progress. This drug may make you feel generally unwell. This is not uncommon, as chemotherapy can affect healthy cells as well as cancer cells. Report any side effects. Continue your course of treatment even though you feel ill unless your doctor tells you to stop. In some cases, you may be given additional medicines to help with side effects. Follow all directions for their use. Call your doctor or health care professional for advice if you get a fever, chills or sore throat, or other symptoms of a cold or flu. Do not treat yourself. This drug decreases your body's ability to fight infections. Try to avoid being around people who are sick. This medicine may increase your risk to bruise or bleed. Call your doctor or health care professional if you notice any unusual bleeding. You may need blood work done while you are taking this medicine. Do not become pregnant while taking this medicine and for 6 months after the last dose. Women should inform their doctor if they wish to become pregnant or think they might be pregnant. Men should not father a child while taking this medicine and for 3 months after the last dose. There is a potential for serious side effects to an unborn child. Talk to your health care professional or pharmacist for more information. Do not breast-feed an infant while taking this medicine and for 1 week after the last dose. This medicine may interfere with   the ability to have a child. Talk with your doctor or health care professional if you are concerned about your fertility. What side effects may I notice from receiving this medicine? Side effects that you should report to your doctor or health care professional as soon as possible:  allergic reactions like skin rash, itching or  hives, swelling of the face, lips, or tongue  low blood counts - this medicine may decrease the number of white blood cells, red blood cells and platelets. You may be at increased risk for infections and bleeding.  signs of infection - fever or chills, cough, sore throat, pain passing urine  signs of decreased platelets or bleeding - bruising, pinpoint red spots on the skin, black, tarry stools, blood in the urine  signs of decreased red blood cells - unusually weak or tired, fainting spells, lightheadedness  signs and symptoms of kidney injury like trouble passing urine or change in the amount of urine  signs and symptoms of liver injury like dark yellow or brown urine; general ill feeling or flu-like symptoms; light-colored stools; loss of appetite; nausea; right upper belly pain; unusually weak or tired; yellowing of the eyes or skin Side effects that usually do not require medical attention (report to your doctor or health care professional if they continue or are bothersome):  constipation  diarrhea  nausea, vomiting  pain or redness at the injection site  unusually weak or tired This list may not describe all possible side effects. Call your doctor for medical advice about side effects. You may report side effects to FDA at 1-800-FDA-1088. Where should I keep my medicine? This drug is given in a hospital or clinic and will not be stored at home. NOTE: This sheet is a summary. It may not cover all possible information. If you have questions about this medicine, talk to your doctor, pharmacist, or health care provider.  2021 Elsevier/Gold Standard (2016-12-27 14:37:51)  

## 2021-02-03 ENCOUNTER — Inpatient Hospital Stay: Payer: PPO

## 2021-02-03 VITALS — BP 109/56 | HR 69 | Resp 18

## 2021-02-03 DIAGNOSIS — Z5111 Encounter for antineoplastic chemotherapy: Secondary | ICD-10-CM | POA: Diagnosis not present

## 2021-02-03 DIAGNOSIS — D46Z Other myelodysplastic syndromes: Secondary | ICD-10-CM

## 2021-02-03 LAB — CBC WITH DIFFERENTIAL (CANCER CENTER ONLY)
Abs Immature Granulocytes: 0.08 10*3/uL — ABNORMAL HIGH (ref 0.00–0.07)
Basophils Absolute: 0 10*3/uL (ref 0.0–0.1)
Basophils Relative: 0 %
Eosinophils Absolute: 0 10*3/uL (ref 0.0–0.5)
Eosinophils Relative: 0 %
HCT: 23.8 % — ABNORMAL LOW (ref 39.0–52.0)
Hemoglobin: 8 g/dL — ABNORMAL LOW (ref 13.0–17.0)
Immature Granulocytes: 5 %
Lymphocytes Relative: 33 %
Lymphs Abs: 0.5 10*3/uL — ABNORMAL LOW (ref 0.7–4.0)
MCH: 31.9 pg (ref 26.0–34.0)
MCHC: 33.6 g/dL (ref 30.0–36.0)
MCV: 94.8 fL (ref 80.0–100.0)
Monocytes Absolute: 0.1 10*3/uL (ref 0.1–1.0)
Monocytes Relative: 6 %
Neutro Abs: 0.9 10*3/uL — ABNORMAL LOW (ref 1.7–7.7)
Neutrophils Relative %: 56 %
Platelet Count: 19 10*3/uL — ABNORMAL LOW (ref 150–400)
RBC: 2.51 MIL/uL — ABNORMAL LOW (ref 4.22–5.81)
RDW: 17.8 % — ABNORMAL HIGH (ref 11.5–15.5)
WBC Count: 1.6 10*3/uL — ABNORMAL LOW (ref 4.0–10.5)
nRBC: 3.1 % — ABNORMAL HIGH (ref 0.0–0.2)

## 2021-02-03 LAB — BASIC METABOLIC PANEL - CANCER CENTER ONLY
Anion gap: 9 (ref 5–15)
BUN: 35 mg/dL — ABNORMAL HIGH (ref 8–23)
CO2: 21 mmol/L — ABNORMAL LOW (ref 22–32)
Calcium: 9.5 mg/dL (ref 8.9–10.3)
Chloride: 98 mmol/L (ref 98–111)
Creatinine: 1.48 mg/dL — ABNORMAL HIGH (ref 0.61–1.24)
GFR, Estimated: 50 mL/min — ABNORMAL LOW (ref >60)
Glucose, Bld: 290 mg/dL — ABNORMAL HIGH (ref 70–99)
Potassium: 3.9 mmol/L (ref 3.5–5.1)
Sodium: 128 mmol/L — ABNORMAL LOW (ref 135–145)

## 2021-02-03 MED ORDER — HEPARIN SOD (PORK) LOCK FLUSH 100 UNIT/ML IV SOLN
500.0000 [IU] | Freq: Once | INTRAVENOUS | Status: AC
  Administered 2021-02-03: 500 [IU] via INTRAVENOUS
  Filled 2021-02-03: qty 5

## 2021-02-03 MED ORDER — SODIUM CHLORIDE 0.9% FLUSH
10.0000 mL | Freq: Once | INTRAVENOUS | Status: AC
  Administered 2021-02-03: 10 mL via INTRAVENOUS
  Filled 2021-02-03: qty 10

## 2021-02-03 NOTE — Patient Instructions (Signed)

## 2021-02-04 DIAGNOSIS — I2510 Atherosclerotic heart disease of native coronary artery without angina pectoris: Secondary | ICD-10-CM | POA: Diagnosis not present

## 2021-02-04 DIAGNOSIS — G4733 Obstructive sleep apnea (adult) (pediatric): Secondary | ICD-10-CM | POA: Diagnosis not present

## 2021-02-04 DIAGNOSIS — I10 Essential (primary) hypertension: Secondary | ICD-10-CM | POA: Diagnosis not present

## 2021-02-04 DIAGNOSIS — E118 Type 2 diabetes mellitus with unspecified complications: Secondary | ICD-10-CM | POA: Diagnosis not present

## 2021-02-04 DIAGNOSIS — E7800 Pure hypercholesterolemia, unspecified: Secondary | ICD-10-CM | POA: Diagnosis not present

## 2021-02-04 DIAGNOSIS — K219 Gastro-esophageal reflux disease without esophagitis: Secondary | ICD-10-CM | POA: Diagnosis not present

## 2021-02-04 DIAGNOSIS — I714 Abdominal aortic aneurysm, without rupture: Secondary | ICD-10-CM | POA: Diagnosis not present

## 2021-02-04 DIAGNOSIS — Z0000 Encounter for general adult medical examination without abnormal findings: Secondary | ICD-10-CM | POA: Diagnosis not present

## 2021-02-04 DIAGNOSIS — Z9989 Dependence on other enabling machines and devices: Secondary | ICD-10-CM | POA: Diagnosis not present

## 2021-02-04 DIAGNOSIS — K2270 Barrett's esophagus without dysplasia: Secondary | ICD-10-CM | POA: Diagnosis not present

## 2021-02-04 DIAGNOSIS — I70203 Unspecified atherosclerosis of native arteries of extremities, bilateral legs: Secondary | ICD-10-CM | POA: Diagnosis not present

## 2021-02-04 DIAGNOSIS — Z7984 Long term (current) use of oral hypoglycemic drugs: Secondary | ICD-10-CM | POA: Diagnosis not present

## 2021-02-04 DIAGNOSIS — D46Z Other myelodysplastic syndromes: Secondary | ICD-10-CM | POA: Diagnosis not present

## 2021-02-10 ENCOUNTER — Other Ambulatory Visit: Payer: Self-pay

## 2021-02-10 ENCOUNTER — Inpatient Hospital Stay: Payer: PPO

## 2021-02-10 ENCOUNTER — Other Ambulatory Visit: Payer: Self-pay | Admitting: Family

## 2021-02-10 ENCOUNTER — Other Ambulatory Visit: Payer: Self-pay | Admitting: *Deleted

## 2021-02-10 ENCOUNTER — Inpatient Hospital Stay: Payer: PPO | Attending: Hematology & Oncology

## 2021-02-10 VITALS — BP 109/46 | HR 75 | Temp 99.1°F | Resp 17

## 2021-02-10 DIAGNOSIS — D649 Anemia, unspecified: Secondary | ICD-10-CM

## 2021-02-10 DIAGNOSIS — D46Z Other myelodysplastic syndromes: Secondary | ICD-10-CM

## 2021-02-10 DIAGNOSIS — R110 Nausea: Secondary | ICD-10-CM | POA: Insufficient documentation

## 2021-02-10 DIAGNOSIS — R21 Rash and other nonspecific skin eruption: Secondary | ICD-10-CM | POA: Insufficient documentation

## 2021-02-10 DIAGNOSIS — D61818 Other pancytopenia: Secondary | ICD-10-CM

## 2021-02-10 DIAGNOSIS — Z5111 Encounter for antineoplastic chemotherapy: Secondary | ICD-10-CM | POA: Diagnosis not present

## 2021-02-10 DIAGNOSIS — Z79899 Other long term (current) drug therapy: Secondary | ICD-10-CM | POA: Insufficient documentation

## 2021-02-10 DIAGNOSIS — D4622 Refractory anemia with excess of blasts 2: Secondary | ICD-10-CM | POA: Insufficient documentation

## 2021-02-10 LAB — CBC WITH DIFFERENTIAL (CANCER CENTER ONLY)
Abs Immature Granulocytes: 0 10*3/uL (ref 0.00–0.07)
Band Neutrophils: 9 %
Basophils Absolute: 0 10*3/uL (ref 0.0–0.1)
Basophils Relative: 0 %
Blasts: 8 %
Eosinophils Absolute: 0 10*3/uL (ref 0.0–0.5)
Eosinophils Relative: 3 %
HCT: 19.8 % — ABNORMAL LOW (ref 39.0–52.0)
Hemoglobin: 6.6 g/dL — CL (ref 13.0–17.0)
Lymphocytes Relative: 34 %
Lymphs Abs: 0.3 10*3/uL — ABNORMAL LOW (ref 0.7–4.0)
MCH: 31.9 pg (ref 26.0–34.0)
MCHC: 33.3 g/dL (ref 30.0–36.0)
MCV: 95.7 fL (ref 80.0–100.0)
Metamyelocytes Relative: 3 %
Monocytes Absolute: 0 10*3/uL — ABNORMAL LOW (ref 0.1–1.0)
Monocytes Relative: 1 %
Myelocytes: 2 %
Neutro Abs: 0.4 10*3/uL — CL (ref 1.7–7.7)
Neutrophils Relative %: 40 %
Platelet Count: 11 10*3/uL — ABNORMAL LOW (ref 150–400)
RBC: 2.07 MIL/uL — ABNORMAL LOW (ref 4.22–5.81)
RDW: 18.4 % — ABNORMAL HIGH (ref 11.5–15.5)
WBC Count: 0.9 10*3/uL — CL (ref 4.0–10.5)
nRBC: 3.3 % — ABNORMAL HIGH (ref 0.0–0.2)

## 2021-02-10 LAB — PREPARE RBC (CROSSMATCH)

## 2021-02-10 LAB — BASIC METABOLIC PANEL - CANCER CENTER ONLY
Anion gap: 8 (ref 5–15)
BUN: 27 mg/dL — ABNORMAL HIGH (ref 8–23)
CO2: 22 mmol/L (ref 22–32)
Calcium: 9.4 mg/dL (ref 8.9–10.3)
Chloride: 98 mmol/L (ref 98–111)
Creatinine: 1.38 mg/dL — ABNORMAL HIGH (ref 0.61–1.24)
GFR, Estimated: 54 mL/min — ABNORMAL LOW (ref >60)
Glucose, Bld: 147 mg/dL — ABNORMAL HIGH (ref 70–99)
Potassium: 4.2 mmol/L (ref 3.5–5.1)
Sodium: 128 mmol/L — ABNORMAL LOW (ref 135–145)

## 2021-02-10 MED ORDER — SODIUM CHLORIDE 0.9% FLUSH
10.0000 mL | Freq: Once | INTRAVENOUS | Status: AC
  Administered 2021-02-10: 10 mL via INTRAVENOUS
  Filled 2021-02-10: qty 10

## 2021-02-10 MED ORDER — HEPARIN SOD (PORK) LOCK FLUSH 100 UNIT/ML IV SOLN
500.0000 [IU] | Freq: Once | INTRAVENOUS | Status: AC
  Administered 2021-02-10: 500 [IU] via INTRAVENOUS
  Filled 2021-02-10: qty 5

## 2021-02-10 NOTE — Patient Instructions (Signed)

## 2021-02-11 ENCOUNTER — Other Ambulatory Visit: Payer: Self-pay | Admitting: *Deleted

## 2021-02-11 ENCOUNTER — Other Ambulatory Visit: Payer: Self-pay

## 2021-02-11 ENCOUNTER — Inpatient Hospital Stay: Payer: PPO

## 2021-02-11 DIAGNOSIS — D649 Anemia, unspecified: Secondary | ICD-10-CM

## 2021-02-11 DIAGNOSIS — D46Z Other myelodysplastic syndromes: Secondary | ICD-10-CM

## 2021-02-11 DIAGNOSIS — Z5111 Encounter for antineoplastic chemotherapy: Secondary | ICD-10-CM | POA: Diagnosis not present

## 2021-02-11 DIAGNOSIS — D696 Thrombocytopenia, unspecified: Secondary | ICD-10-CM

## 2021-02-11 DIAGNOSIS — D61818 Other pancytopenia: Secondary | ICD-10-CM

## 2021-02-11 MED ORDER — ACETAMINOPHEN 325 MG PO TABS: 650.0000 mg | ORAL_TABLET | Freq: Once | ORAL | Status: DC

## 2021-02-11 MED ORDER — SODIUM CHLORIDE 0.9% FLUSH
10.0000 mL | INTRAVENOUS | Status: AC | PRN
  Administered 2021-02-11: 10 mL
  Filled 2021-02-11: qty 10

## 2021-02-11 MED ORDER — HEPARIN SOD (PORK) LOCK FLUSH 100 UNIT/ML IV SOLN
500.0000 [IU] | Freq: Every day | INTRAVENOUS | Status: AC | PRN
  Administered 2021-02-11: 500 [IU]
  Filled 2021-02-11: qty 5

## 2021-02-11 MED ORDER — SODIUM CHLORIDE 0.9% IV SOLUTION
250.0000 mL | Freq: Once | INTRAVENOUS | Status: AC
  Administered 2021-02-11: 250 mL via INTRAVENOUS
  Filled 2021-02-11: qty 250

## 2021-02-11 MED ORDER — DIPHENHYDRAMINE HCL 25 MG PO CAPS: 25.0000 mg | ORAL_CAPSULE | Freq: Once | ORAL | Status: DC

## 2021-02-11 MED ORDER — FUROSEMIDE 10 MG/ML IJ SOLN: 20.0000 mg | Freq: Once | INTRAMUSCULAR | Status: DC

## 2021-02-11 NOTE — Progress Notes (Signed)
Patient noted with nosebleed today in infusion.  Platelets 11,000 yesterday.  Dr Marin Olp notified.  Orders for Platelets for today.

## 2021-02-11 NOTE — Patient Instructions (Signed)

## 2021-02-12 ENCOUNTER — Other Ambulatory Visit: Payer: Self-pay | Admitting: Hematology & Oncology

## 2021-02-12 LAB — TYPE AND SCREEN
ABO/RH(D): A POS
Antibody Screen: NEGATIVE
Unit division: 0
Unit division: 0

## 2021-02-12 LAB — BPAM RBC
Blood Product Expiration Date: 202203232359
Blood Product Expiration Date: 202203232359
ISSUE DATE / TIME: 202203030740
ISSUE DATE / TIME: 202203030740
Unit Type and Rh: 6200
Unit Type and Rh: 6200

## 2021-02-12 LAB — PREPARE PLATELET PHERESIS: Unit division: 0

## 2021-02-12 LAB — BPAM PLATELET PHERESIS
Blood Product Expiration Date: 202203032359
ISSUE DATE / TIME: 202203031122
Unit Type and Rh: 5100

## 2021-02-17 ENCOUNTER — Inpatient Hospital Stay: Payer: PPO

## 2021-02-17 ENCOUNTER — Other Ambulatory Visit: Payer: Self-pay | Admitting: *Deleted

## 2021-02-17 ENCOUNTER — Other Ambulatory Visit: Payer: Self-pay

## 2021-02-17 DIAGNOSIS — Z5111 Encounter for antineoplastic chemotherapy: Secondary | ICD-10-CM | POA: Diagnosis not present

## 2021-02-17 DIAGNOSIS — D696 Thrombocytopenia, unspecified: Secondary | ICD-10-CM

## 2021-02-17 DIAGNOSIS — D46Z Other myelodysplastic syndromes: Secondary | ICD-10-CM

## 2021-02-17 LAB — BASIC METABOLIC PANEL - CANCER CENTER ONLY
Anion gap: 10 (ref 5–15)
BUN: 21 mg/dL (ref 8–23)
CO2: 22 mmol/L (ref 22–32)
Calcium: 9.5 mg/dL (ref 8.9–10.3)
Chloride: 99 mmol/L (ref 98–111)
Creatinine: 1.42 mg/dL — ABNORMAL HIGH (ref 0.61–1.24)
GFR, Estimated: 53 mL/min — ABNORMAL LOW (ref >60)
Glucose, Bld: 211 mg/dL — ABNORMAL HIGH (ref 70–99)
Potassium: 3.9 mmol/L (ref 3.5–5.1)
Sodium: 131 mmol/L — ABNORMAL LOW (ref 135–145)

## 2021-02-17 LAB — CBC WITH DIFFERENTIAL (CANCER CENTER ONLY)
Abs Immature Granulocytes: 0.1 10*3/uL — ABNORMAL HIGH (ref 0.00–0.07)
Band Neutrophils: 2 %
Basophils Absolute: 0 10*3/uL (ref 0.0–0.1)
Basophils Relative: 0 %
Blasts: 14 %
Eosinophils Absolute: 0 10*3/uL (ref 0.0–0.5)
Eosinophils Relative: 0 %
HCT: 22.9 % — ABNORMAL LOW (ref 39.0–52.0)
Hemoglobin: 7.5 g/dL — ABNORMAL LOW (ref 13.0–17.0)
Lymphocytes Relative: 42 %
Lymphs Abs: 0.4 10*3/uL — ABNORMAL LOW (ref 0.7–4.0)
MCH: 31.6 pg (ref 26.0–34.0)
MCHC: 32.8 g/dL (ref 30.0–36.0)
MCV: 96.6 fL (ref 80.0–100.0)
Metamyelocytes Relative: 4 %
Monocytes Absolute: 0 10*3/uL — ABNORMAL LOW (ref 0.1–1.0)
Monocytes Relative: 2 %
Myelocytes: 1 %
Neutro Abs: 0.4 10*3/uL — CL (ref 1.7–7.7)
Neutrophils Relative %: 35 %
Platelet Count: 15 10*3/uL — ABNORMAL LOW (ref 150–400)
RBC: 2.37 MIL/uL — ABNORMAL LOW (ref 4.22–5.81)
RDW: 18.3 % — ABNORMAL HIGH (ref 11.5–15.5)
WBC Count: 1 10*3/uL — ABNORMAL LOW (ref 4.0–10.5)
nRBC: 4 % — ABNORMAL HIGH (ref 0.0–0.2)
nRBC: 4 /100 WBC — ABNORMAL HIGH

## 2021-02-17 LAB — PREPARE RBC (CROSSMATCH)

## 2021-02-17 MED ORDER — HEPARIN SOD (PORK) LOCK FLUSH 100 UNIT/ML IV SOLN
500.0000 [IU] | Freq: Every day | INTRAVENOUS | Status: AC | PRN
  Administered 2021-02-17: 500 [IU]
  Filled 2021-02-17: qty 5

## 2021-02-17 MED ORDER — SODIUM CHLORIDE 0.9% IV SOLUTION
250.0000 mL | Freq: Once | INTRAVENOUS | Status: AC
  Administered 2021-02-17: 250 mL via INTRAVENOUS
  Filled 2021-02-17: qty 250

## 2021-02-17 MED ORDER — SODIUM CHLORIDE 0.9% FLUSH
10.0000 mL | INTRAVENOUS | Status: AC | PRN
  Administered 2021-02-17: 10 mL
  Filled 2021-02-17: qty 10

## 2021-02-17 NOTE — Patient Instructions (Signed)
Thrombocytopenia Thrombocytopenia is a condition in which you have a low number of platelets in your blood. Platelets are also called thrombocytes. Platelets are tiny cells in the blood. When you bleed, they clump together at the cut or injury to stop the bleeding. This is called blood clotting. Not having enough platelets can cause bleeding problems. Some cases of thrombocytopenia are mild while others are more severe. What are the causes? This condition may be caused by:  Decreased production of platelets. This may be caused by: ? Aplastic anemia. This is when your bone marrow stops making blood cells. ? Cancer in the bone marrow. ? Certain medicines, including chemotherapy. ? Infection in the bone marrow. ? Drinking a lot of alcohol.  Increased destruction of platelets. This may be caused by: ? Certain immune diseases. ? Certain medicines. ? Certain blood clotting disorders. ? Certain inherited disorders. ? Certain bleeding disorders. ? Pregnancy. ? Having an enlarged spleen (hypersplenism). In hypersplenism, the spleen gathers up platelets from circulation. This means that the platelets are not available to help with blood clotting. The spleen can be enlarged because of cirrhosis or other conditions. What are the signs or symptoms? Symptoms of this condition are the result of poor blood clotting. They will vary depending on how low the platelet counts are. Symptoms may include:  Abnormal bleeding.  Nosebleeds.  Heavy menstrual periods.  Blood in the urine or stool (feces).  A purplish discoloration in the skin (purpura).  Bruising.  A rash that looks like pinpoint, purplish-red spots (petechiae) on the skin and mucous membranes. How is this diagnosed? This condition may be diagnosed with blood tests and a physical exam. Sometimes, a sample of bone marrow may be removed to look for the original cells (megakaryocytes) that make platelets. Other tests may be needed depending on  the cause.   How is this treated? Treatment for this condition depends on the cause. Treatment options may include:  Treatment of another condition that is causing the low platelet count.  Medicines to help protect your platelets from being destroyed.  A replacement (transfusion) of platelets to stop or prevent bleeding.  Surgery to remove the spleen. Follow these instructions at home: Activity  Avoid activities that could cause injury or bruising, and follow instructions about how to prevent falls.  Take extra care not to cut yourself when you shave or when you use scissors, needles, knives, and other tools.  Take extra care to protect yourself from burns when ironing or cooking. General instructions  Check your skin and the inside of your mouth for bruising or bleeding as told by your health care provider.  Check your spit (sputum), urine, and stool for blood as told by your health care provider.  Do not drink alcohol.  Take over-the-counter and prescription medicines only as told by your health care provider.  Do not take any medicines that have aspirin or NSAIDs in them. These medicines can thin your blood and cause you to bleed more easily.  Tell all your health care providers, including dentists and eye doctors, about your condition.   Contact a health care provider if you have:  Unexplained bruising. Get help right away if you have:  Active bleeding from anywhere on your body.  Blood in your sputum, urine, or stool. Summary  Thrombocytopenia is a condition in which you have a low number of platelets in your blood.  Platelets are needed for blood clotting.  Symptoms of this condition are the result of poor blood  clotting and may include abnormal bleeding, nosebleeds, and bruising.  This condition may be diagnosed with blood tests and a physical exam.  Treatment for this condition depends on the cause. This information is not intended to replace advice given to  you by your health care provider. Make sure you discuss any questions you have with your health care provider. Document Revised: 08/30/2018 Document Reviewed: 08/30/2018 Elsevier Patient Education  [place].

## 2021-02-17 NOTE — Patient Instructions (Signed)

## 2021-02-18 ENCOUNTER — Telehealth: Payer: Self-pay | Admitting: *Deleted

## 2021-02-18 ENCOUNTER — Inpatient Hospital Stay: Payer: PPO

## 2021-02-18 ENCOUNTER — Other Ambulatory Visit: Payer: Self-pay | Admitting: *Deleted

## 2021-02-18 DIAGNOSIS — D46Z Other myelodysplastic syndromes: Secondary | ICD-10-CM

## 2021-02-18 DIAGNOSIS — D61818 Other pancytopenia: Secondary | ICD-10-CM

## 2021-02-18 DIAGNOSIS — Z5111 Encounter for antineoplastic chemotherapy: Secondary | ICD-10-CM | POA: Diagnosis not present

## 2021-02-18 DIAGNOSIS — D696 Thrombocytopenia, unspecified: Secondary | ICD-10-CM

## 2021-02-18 DIAGNOSIS — D649 Anemia, unspecified: Secondary | ICD-10-CM

## 2021-02-18 LAB — CBC WITH DIFFERENTIAL (CANCER CENTER ONLY)
Abs Immature Granulocytes: 0.12 10*3/uL — ABNORMAL HIGH (ref 0.00–0.07)
Basophils Absolute: 0 10*3/uL (ref 0.0–0.1)
Basophils Relative: 0 %
Eosinophils Absolute: 0 10*3/uL (ref 0.0–0.5)
Eosinophils Relative: 0 %
HCT: 20.4 % — ABNORMAL LOW (ref 39.0–52.0)
Hemoglobin: 6.6 g/dL — CL (ref 13.0–17.0)
Immature Granulocytes: 17 %
Lymphocytes Relative: 40 %
Lymphs Abs: 0.3 10*3/uL — ABNORMAL LOW (ref 0.7–4.0)
MCH: 31.3 pg (ref 26.0–34.0)
MCHC: 32.4 g/dL (ref 30.0–36.0)
MCV: 96.7 fL (ref 80.0–100.0)
Monocytes Absolute: 0.1 10*3/uL (ref 0.1–1.0)
Monocytes Relative: 16 %
Neutro Abs: 0.2 10*3/uL — CL (ref 1.7–7.7)
Neutrophils Relative %: 27 %
Platelet Count: 17 10*3/uL — ABNORMAL LOW (ref 150–400)
RBC: 2.11 MIL/uL — ABNORMAL LOW (ref 4.22–5.81)
RDW: 18.5 % — ABNORMAL HIGH (ref 11.5–15.5)
WBC Count: 0.7 10*3/uL — CL (ref 4.0–10.5)
nRBC: 5.6 % — ABNORMAL HIGH (ref 0.0–0.2)

## 2021-02-18 LAB — PREPARE PLATELET PHERESIS: Unit division: 0

## 2021-02-18 LAB — BPAM PLATELET PHERESIS
Blood Product Expiration Date: 202203102359
ISSUE DATE / TIME: 202203091005
Unit Type and Rh: 7300

## 2021-02-18 MED ORDER — DIPHENHYDRAMINE HCL 25 MG PO CAPS
25.0000 mg | ORAL_CAPSULE | Freq: Once | ORAL | Status: AC
  Administered 2021-02-18: 25 mg via ORAL

## 2021-02-18 MED ORDER — ACETAMINOPHEN 325 MG PO TABS
650.0000 mg | ORAL_TABLET | Freq: Once | ORAL | Status: AC
  Administered 2021-02-18: 650 mg via ORAL

## 2021-02-18 MED ORDER — ACETAMINOPHEN 325 MG PO TABS
ORAL_TABLET | ORAL | Status: AC
  Filled 2021-02-18: qty 2

## 2021-02-18 MED ORDER — DIPHENHYDRAMINE HCL 25 MG PO CAPS
ORAL_CAPSULE | ORAL | Status: AC
  Filled 2021-02-18: qty 1

## 2021-02-18 NOTE — Patient Instructions (Signed)
Platelet Transfusion A platelet transfusion is a procedure in which you receive donated platelets through an IV. Platelets are tiny pieces of blood cells. When you get an injury, platelets clump together in the area to form a blood clot. This helps stop bleeding and is the beginning of the healing process. If you have too few platelets, your blood may have trouble clotting. This may cause you to bleed and bruise very easily. You may need a platelet transfusion if you have a condition that causes a low number of platelets (thrombocytopenia). A platelet transfusion may be used to stop or prevent excessive bleeding. Tell a health care provider about:  Any reactions you have had during previous transfusions.  Any allergies you have.  All medicines you are taking, including vitamins, herbs, eye drops, creams, and over-the-counter medicines.  Any blood disorders you have.  Any surgeries you have had.  Any medical conditions you have.  Whether you are pregnant or may be pregnant. What are the risks? Generally, this is a safe procedure. However, problems may occur, including:  Fever.  Infection.  Allergic reaction to the donor platelets.  Your body's disease-fighting system (immune system) attacking the donor platelets (hemolytic reaction). This is rare.  A rare reaction that causes lung damage (transfusion-related acute lung injury). What happens before the procedure? Medicines  Ask your health care provider about: ? Changing or stopping your regular medicines. This is especially important if you are taking diabetes medicines or blood thinners. ? Taking medicines such as aspirin and ibuprofen. These medicines can thin your blood. Do not take these medicines unless your health care provider tells you to take them. ? Taking over-the-counter medicines, vitamins, herbs, and supplements. General instructions  You will have a blood test to determine your blood type. Your blood type  determines what kind of platelets you will be given.  Follow instructions from your health care provider about eating or drinking restrictions.  If you have had an allergic reaction to a transfusion in the past, you may be given medicine to help prevent a reaction.  Your temperature, blood pressure, pulse, and breathing will be monitored. What happens during the procedure?  An IV will be inserted into one of your veins.  For your safety, two health care providers will verify your identity along with the donor platelets about to be infused.  A bag of donor platelets will be connected to your IV. The platelets will flow into your bloodstream. This usually takes 30-60 minutes.  Your temperature, blood pressure, pulse, and breathing will be monitored during the transfusion. This helps detect early signs of any reaction.  You will also be monitored for other symptoms that may indicate a reaction, including chills, hives, or itching.  If you have signs of a reaction at any time, your transfusion will be stopped, and you may be given medicine to help manage the reaction.  When your transfusion is complete, your IV will be removed.  Pressure may be applied to the IV site for a few minutes to stop any bleeding.  The IV site will be covered with a bandage (dressing). The procedure may vary among health care providers and hospitals.   What happens after the procedure?  Your blood pressure, temperature, pulse, and breathing will be monitored until you leave the hospital or clinic.  You may have some bruising and soreness at your IV site. Follow these instructions at home: Medicines  Take over-the-counter and prescription medicines only as told by your health care   provider.  Talk with your health care provider before you take any medicines that contain aspirin or NSAIDs. These medicines increase your risk for dangerous bleeding. General instructions  Change or remove your dressing as told  by your health care provider.  Return to your normal activities as told by your health care provider. Ask your health care provider what activities are safe for you.  Do not take baths, swim, or use a hot tub until your health care provider approves. Ask your health care provider if you may take showers.  Check your IV site every day for signs of infection. Check for: ? Redness, swelling, or pain. ? Fluid or blood. If fluid or blood drains from your IV site, use your hands to press down firmly on a bandage covering the area for a minute or two. Doing this should stop the bleeding. ? Warmth. ? Pus or a bad smell.  Keep all follow-up visits as told by your health care provider. This is important. Contact a health care provider if you have:  A headache that does not go away with medicine.  Hives, rash, or itchy skin.  Nausea or vomiting.  Unusual tiredness or weakness.  Signs of infection at your IV site. Get help right away if:  You have a fever or chills.  You urinate less often than usual.  Your urine is darker colored than normal.  You have any of the following: ? Trouble breathing. ? Pain in your back, abdomen, or chest. ? Cool, clammy skin. ? A fast heartbeat. Summary  Platelets are tiny pieces of blood cells that clump together to form a blood clot when you have an injury. If you have too few platelets, your blood may have trouble clotting.  A platelet transfusion is a procedure in which you receive donated platelets through an IV.  A platelet transfusion may be used to stop or prevent excessive bleeding.  After the procedure, check your IV site every day for signs of infection, including redness, swelling, pain, or warmth. This information is not intended to replace advice given to you by your health care provider. Make sure you discuss any questions you have with your health care provider. Document Revised: 01/03/2018 Document Reviewed: 01/03/2018 Elsevier  Patient Education  2021 [employer]. https://www.redcrossblood.org/donate-blood/blood-donation-process/what-happens-to-donated-blood/blood-transfusions/types-of-blood-transfusions.html"> https://www.hematology.org/education/patients/blood-basics/blood-safety-and-matching"> https://www.nhlbi.nih.gov/health-topics/blood-transfusion">  Blood Transfusion, Adult A blood transfusion is a procedure in which you receive blood or a type of blood cell (blood component) through an IV. You may need a blood transfusion when your blood level is low. This may result from a bleeding disorder, illness, injury, or surgery. The blood may come from a donor. You may also be able to donate blood for yourself (autologous blood donation) before a planned surgery. The blood given in a transfusion is made up of different blood components. You may receive:  Red blood cells. These carry oxygen to the cells in the body.  Platelets. These help your blood to clot.  Plasma. This is the liquid part of your blood. It carries proteins and other substances throughout the body.  White blood cells. These help you fight infections. If you have hemophilia or another clotting disorder, you may also receive other types of blood products. Tell a health care provider about:  Any blood disorders you have.  Any previous reactions you have had during a blood transfusion.  Any allergies you have.  All medicines you are taking, including vitamins, herbs, eye drops, creams, and over-the-counter medicines.  Any surgeries you have had.  Any  medical conditions you have, including any recent fever or cold symptoms.  Whether you are pregnant or may be pregnant. What are the risks? Generally, this is a safe procedure. However, problems may occur.  The most common problems include: ? A mild allergic reaction, such as red, swollen areas of skin (hives) and itching. ? Fever or chills. This may be the body's response to new blood cells  received. This may occur during or up to 4 hours after the transfusion.  More serious problems may include: ? Transfusion-associated circulatory overload (TACO), or too much fluid in the lungs. This may cause breathing problems. ? A serious allergic reaction, such as difficulty breathing or swelling around the face and lips. ? Transfusion-related acute lung injury (TRALI), which causes breathing difficulty and low oxygen in the blood. This can occur within hours of the transfusion or several days later. ? Iron overload. This can happen after receiving many blood transfusions over a period of time. ? Infection or virus being transmitted. This is rare because donated blood is carefully tested before it is given. ? Hemolytic transfusion reaction. This is rare. It happens when your body's defense system (immune system)tries to attack the new blood cells. Symptoms may include fever, chills, nausea, low blood pressure, and low back or chest pain. ? Transfusion-associated graft-versus-host disease (TAGVHD). This is rare. It happens when donated cells attack your body's healthy tissues. What happens before the procedure? Medicines Ask your health care provider about:  Changing or stopping your regular medicines. This is especially important if you are taking diabetes medicines or blood thinners.  Taking medicines such as aspirin and ibuprofen. These medicines can thin your blood. Do not take these medicines unless your health care provider tells you to take them.  Taking over-the-counter medicines, vitamins, herbs, and supplements. General instructions  Follow instructions from your health care provider about eating and drinking restrictions.  You will have a blood test to determine your blood type. This is necessary to know what kind of blood your body will accept and to match it to the donor blood.  If you are going to have a planned surgery, you may be able to do an autologous blood donation.  This may be done in case you need to have a transfusion.  You will have your temperature, blood pressure, and pulse monitored before the transfusion.  If you have had an allergic reaction to a transfusion in the past, you may be given medicine to help prevent a reaction. This medicine may be given to you by mouth (orally) or through an IV.  Set aside time for the blood transfusion. This procedure generally takes 1-4 hours to complete. What happens during the procedure?  An IV will be inserted into one of your veins.  The bag of donated blood will be attached to your IV. The blood will then enter through your vein.  Your temperature, blood pressure, and pulse will be monitored regularly during the transfusion. This monitoring is done to detect early signs of a transfusion reaction.  Tell your nurse right away if you have any of these symptoms during the transfusion: ? Shortness of breath or trouble breathing. ? Chest or back pain. ? Fever or chills. ? Hives or itching.  If you have any signs or symptoms of a reaction, your transfusion will be stopped and you may be given medicine.  When the transfusion is complete, your IV will be removed.  Pressure may be applied to the IV site for a  few minutes.  A bandage (dressing)will be applied. The procedure may vary among health care providers and hospitals.   What happens after the procedure?  Your temperature, blood pressure, pulse, breathing rate, and blood oxygen level will be monitored until you leave the hospital or clinic.  Your blood may be tested to see how you are responding to the transfusion.  You may be warmed with fluids or blankets to maintain a normal body temperature.  If you receive your blood transfusion in an outpatient setting, you will be told whom to contact to report any reactions. Where to find more information For more information on blood transfusions, visit the American Red Cross: redcross.org Summary  A  blood transfusion is a procedure in which you receive blood or a type of blood cell (blood component) through an IV.  The blood you receive may come from a donor or be donated by yourself (autologous blood donation) before a planned surgery.  The blood given in a transfusion is made up of different blood components. You may receive red blood cells, platelets, plasma, or white blood cells depending on the condition treated.  Your temperature, blood pressure, and pulse will be monitored before, during, and after the transfusion.  After the transfusion, your blood may be tested to see how your body has responded. This information is not intended to replace advice given to you by your health care provider. Make sure you discuss any questions you have with your health care provider. Document Revised: 10/03/2019 Document Reviewed: 05/23/2019 Elsevier Patient Education  [place].

## 2021-02-18 NOTE — Telephone Encounter (Addendum)
Dr. Marin Olp notified of platelet count-17, WBC-0.7, ANC-0.2 and HGB-6.6.  Order received for patient to get one unit of platelets today.

## 2021-02-19 ENCOUNTER — Inpatient Hospital Stay: Payer: PPO

## 2021-02-19 ENCOUNTER — Telehealth: Payer: Self-pay | Admitting: *Deleted

## 2021-02-19 ENCOUNTER — Other Ambulatory Visit: Payer: Self-pay | Admitting: Family

## 2021-02-19 ENCOUNTER — Other Ambulatory Visit: Payer: PPO

## 2021-02-19 ENCOUNTER — Other Ambulatory Visit: Payer: Self-pay | Admitting: *Deleted

## 2021-02-19 ENCOUNTER — Other Ambulatory Visit: Payer: Self-pay

## 2021-02-19 DIAGNOSIS — J343 Hypertrophy of nasal turbinates: Secondary | ICD-10-CM | POA: Diagnosis not present

## 2021-02-19 DIAGNOSIS — D696 Thrombocytopenia, unspecified: Secondary | ICD-10-CM

## 2021-02-19 DIAGNOSIS — D46Z Other myelodysplastic syndromes: Secondary | ICD-10-CM

## 2021-02-19 DIAGNOSIS — D649 Anemia, unspecified: Secondary | ICD-10-CM

## 2021-02-19 DIAGNOSIS — D61818 Other pancytopenia: Secondary | ICD-10-CM

## 2021-02-19 DIAGNOSIS — R040 Epistaxis: Secondary | ICD-10-CM | POA: Diagnosis not present

## 2021-02-19 DIAGNOSIS — Z5111 Encounter for antineoplastic chemotherapy: Secondary | ICD-10-CM | POA: Diagnosis not present

## 2021-02-19 LAB — BPAM PLATELET PHERESIS
Blood Product Expiration Date: 202203132359
ISSUE DATE / TIME: 202203101215
Unit Type and Rh: 5100

## 2021-02-19 LAB — CBC WITH DIFFERENTIAL (CANCER CENTER ONLY)
Abs Immature Granulocytes: 0.1 10*3/uL — ABNORMAL HIGH (ref 0.00–0.07)
Basophils Absolute: 0 10*3/uL (ref 0.0–0.1)
Basophils Relative: 0 %
Blasts: 16 %
Eosinophils Absolute: 0 10*3/uL (ref 0.0–0.5)
Eosinophils Relative: 1 %
HCT: 23.3 % — ABNORMAL LOW (ref 39.0–52.0)
Hemoglobin: 7.5 g/dL — ABNORMAL LOW (ref 13.0–17.0)
Lymphocytes Relative: 31 %
Lymphs Abs: 0.2 10*3/uL — ABNORMAL LOW (ref 0.7–4.0)
MCH: 30.7 pg (ref 26.0–34.0)
MCHC: 32.2 g/dL (ref 30.0–36.0)
MCV: 95.5 fL (ref 80.0–100.0)
Metamyelocytes Relative: 4 %
Monocytes Absolute: 0 10*3/uL — ABNORMAL LOW (ref 0.1–1.0)
Monocytes Relative: 6 %
Myelocytes: 3 %
Neutro Abs: 0.3 10*3/uL — CL (ref 1.7–7.7)
Neutrophils Relative %: 39 %
Platelet Count: 22 10*3/uL — ABNORMAL LOW (ref 150–400)
RBC: 2.44 MIL/uL — ABNORMAL LOW (ref 4.22–5.81)
RDW: 18.3 % — ABNORMAL HIGH (ref 11.5–15.5)
WBC Count: 0.8 10*3/uL — CL (ref 4.0–10.5)
nRBC: 9 % — ABNORMAL HIGH (ref 0.0–0.2)
nRBC: 9 /100 WBC — ABNORMAL HIGH

## 2021-02-19 LAB — APTT: aPTT: 34 seconds (ref 24–36)

## 2021-02-19 LAB — PREPARE PLATELET PHERESIS: Unit division: 0

## 2021-02-19 LAB — TYPE AND SCREEN
ABO/RH(D): A POS
Antibody Screen: NEGATIVE
Unit division: 0

## 2021-02-19 LAB — BPAM RBC
Blood Product Expiration Date: 202204042359
ISSUE DATE / TIME: 202203100743
Unit Type and Rh: 6200

## 2021-02-19 LAB — PROTIME-INR
INR: 1.1 (ref 0.8–1.2)
Prothrombin Time: 13.6 seconds (ref 11.4–15.2)

## 2021-02-19 MED ORDER — SODIUM CHLORIDE 0.9% IV SOLUTION
250.0000 mL | Freq: Once | INTRAVENOUS | Status: AC
  Administered 2021-02-19: 250 mL via INTRAVENOUS
  Filled 2021-02-19: qty 250

## 2021-02-19 MED ORDER — SODIUM CHLORIDE 0.9% FLUSH
10.0000 mL | INTRAVENOUS | Status: AC | PRN
  Administered 2021-02-19: 10 mL
  Filled 2021-02-19: qty 10

## 2021-02-19 MED ORDER — HEPARIN SOD (PORK) LOCK FLUSH 100 UNIT/ML IV SOLN
500.0000 [IU] | Freq: Every day | INTRAVENOUS | Status: AC | PRN
  Administered 2021-02-19: 500 [IU]
  Filled 2021-02-19: qty 5

## 2021-02-19 NOTE — Telephone Encounter (Signed)
Call received from patient's wife Caren Griffins stating that patient's nose is still bleeding.  Caren Griffins notified to bring patient in now per order of Dr. Marin Olp.  Caren Griffins states they will be here at approx 9:30AM.

## 2021-02-19 NOTE — Patient Instructions (Signed)

## 2021-02-19 NOTE — Telephone Encounter (Signed)
Dr. Marin Olp notified of WBC-0.8, ANC-0.3, HGB-7.5 and plts-22.  Order received for pt to get one unit of platelets today.

## 2021-02-20 LAB — PREPARE PLATELET PHERESIS: Unit division: 0

## 2021-02-20 LAB — BPAM PLATELET PHERESIS
Blood Product Expiration Date: 202203122359
ISSUE DATE / TIME: 202203111051
Unit Type and Rh: 9500

## 2021-02-22 ENCOUNTER — Other Ambulatory Visit: Payer: Self-pay | Admitting: Family

## 2021-02-22 ENCOUNTER — Inpatient Hospital Stay: Payer: PPO

## 2021-02-22 ENCOUNTER — Encounter: Payer: Self-pay | Admitting: Hematology & Oncology

## 2021-02-22 ENCOUNTER — Other Ambulatory Visit: Payer: Self-pay | Admitting: *Deleted

## 2021-02-22 ENCOUNTER — Telehealth: Payer: Self-pay | Admitting: *Deleted

## 2021-02-22 ENCOUNTER — Telehealth: Payer: Self-pay

## 2021-02-22 ENCOUNTER — Other Ambulatory Visit: Payer: Self-pay

## 2021-02-22 ENCOUNTER — Inpatient Hospital Stay: Payer: PPO | Admitting: Hematology & Oncology

## 2021-02-22 VITALS — Wt 216.0 lb

## 2021-02-22 DIAGNOSIS — D46Z Other myelodysplastic syndromes: Secondary | ICD-10-CM

## 2021-02-22 DIAGNOSIS — Z5111 Encounter for antineoplastic chemotherapy: Secondary | ICD-10-CM | POA: Diagnosis not present

## 2021-02-22 DIAGNOSIS — D649 Anemia, unspecified: Secondary | ICD-10-CM

## 2021-02-22 DIAGNOSIS — D696 Thrombocytopenia, unspecified: Secondary | ICD-10-CM

## 2021-02-22 LAB — IRON AND TIBC
Iron: 117 ug/dL (ref 42–163)
Saturation Ratios: 41 % (ref 20–55)
TIBC: 284 ug/dL (ref 202–409)
UIBC: 167 ug/dL (ref 117–376)

## 2021-02-22 LAB — CBC WITH DIFFERENTIAL (CANCER CENTER ONLY)
Abs Immature Granulocytes: 0.03 10*3/uL (ref 0.00–0.07)
Band Neutrophils: 0 %
Basophils Absolute: 0 10*3/uL (ref 0.0–0.1)
Basophils Relative: 0 %
Eosinophils Absolute: 0 10*3/uL (ref 0.0–0.5)
Eosinophils Relative: 0 %
HCT: 20.8 % — ABNORMAL LOW (ref 39.0–52.0)
Hemoglobin: 6.9 g/dL — CL (ref 13.0–17.0)
Immature Granulocytes: 8 %
Lymphocytes Relative: 45 %
Lymphs Abs: 0.3 10*3/uL — ABNORMAL LOW (ref 0.7–4.0)
MCH: 32.1 pg (ref 26.0–34.0)
MCHC: 33.2 g/dL (ref 30.0–36.0)
MCV: 96.7 fL (ref 80.0–100.0)
Monocytes Absolute: 0.1 10*3/uL (ref 0.1–1.0)
Monocytes Relative: 12 %
Neutro Abs: 0.2 10*3/uL — CL (ref 1.7–7.7)
Neutrophils Relative %: 35 %
Platelet Count: 22 10*3/uL — ABNORMAL LOW (ref 150–400)
RBC: 2.15 MIL/uL — ABNORMAL LOW (ref 4.22–5.81)
RDW: 19.2 % — ABNORMAL HIGH (ref 11.5–15.5)
WBC Count: 0.7 10*3/uL — CL (ref 4.0–10.5)
nRBC: 4.6 % — ABNORMAL HIGH (ref 0.0–0.2)
nRBC: 5 /100 WBC — ABNORMAL HIGH

## 2021-02-22 LAB — RETICULOCYTES
Immature Retic Fract: 21.6 % — ABNORMAL HIGH (ref 2.3–15.9)
RBC.: 2.12 MIL/uL — ABNORMAL LOW (ref 4.22–5.81)
Retic Count, Absolute: 87.8 10*3/uL (ref 19.0–186.0)
Retic Ct Pct: 4.1 % — ABNORMAL HIGH (ref 0.4–3.1)

## 2021-02-22 LAB — CMP (CANCER CENTER ONLY)
ALT: 12 U/L (ref 0–44)
AST: 19 U/L (ref 15–41)
Albumin: 4.3 g/dL (ref 3.5–5.0)
Alkaline Phosphatase: 46 U/L (ref 38–126)
Anion gap: 8 (ref 5–15)
BUN: 20 mg/dL (ref 8–23)
CO2: 22 mmol/L (ref 22–32)
Calcium: 9.1 mg/dL (ref 8.9–10.3)
Chloride: 100 mmol/L (ref 98–111)
Creatinine: 1.36 mg/dL — ABNORMAL HIGH (ref 0.61–1.24)
GFR, Estimated: 55 mL/min — ABNORMAL LOW (ref >60)
Glucose, Bld: 173 mg/dL — ABNORMAL HIGH (ref 70–99)
Potassium: 4 mmol/L (ref 3.5–5.1)
Sodium: 130 mmol/L — ABNORMAL LOW (ref 135–145)
Total Bilirubin: 0.6 mg/dL (ref 0.3–1.2)
Total Protein: 6.6 g/dL (ref 6.5–8.1)

## 2021-02-22 LAB — LACTATE DEHYDROGENASE: LDH: 203 U/L — ABNORMAL HIGH (ref 98–192)

## 2021-02-22 LAB — FERRITIN: Ferritin: 224 ng/mL (ref 24–336)

## 2021-02-22 LAB — SAMPLE TO BLOOD BANK

## 2021-02-22 LAB — PREPARE RBC (CROSSMATCH)

## 2021-02-22 NOTE — Patient Instructions (Signed)

## 2021-02-22 NOTE — Progress Notes (Signed)
Hematology and Oncology Follow Up Visit  Theodore Jenkins [id number] [birth year] 75 y.o. 02/22/2021   Principle Diagnosis:   MDS -- RAEB-2  --  Normal cytogenetics;  IPSS = 5  Current Therapy:    Vidaza 75 mg/m2 IV q day d1-7  -- s/p cycle #1- start on 01/25/2021     Interim History:  Theodore Jenkins is in for follow-up.  His only had 1 cycle of treatment to date.  He is again transfused.  Her plugging had to give him some blood again today.  He was having a nosebleeds.  He went to see Dr. Redmond Baseman of otolaryngology.  Dr. Para Skeans cauterized a vessel in the nasal septum that was causing the bleeding.  His appetite is marginal.  He has had no vomiting.  He has had a little bit of nausea.  There is been no fever.  He has had a little bit of a cough.  There is no shortness of breath.  He did have have Covid back in January.  I think he is overall this.  He is not had any rashes.  Overall, I would say his performance status is ECOG 1.     Medications:  Current Outpatient Medications:  .  acetaminophen (TYLENOL) 500 MG tablet, Take 1,000 mg by mouth every 8 (eight) hours as needed for mild pain (or headaches)., Disp: , Rfl:  .  ascorbic acid (VITAMIN C) 500 MG tablet, Take 500 mg by mouth daily with breakfast., Disp: , Rfl:  .  chlorhexidine (PERIDEX) 0.12 % solution, Use as directed 15 mLs in the mouth or throat 2 (two) times daily., Disp: 450 mL, Rfl: 3 .  Cyanocobalamin (VITAMIN B 12) 500 MCG TABS, Take 500 mcg by mouth daily., Disp: , Rfl:  .  dexamethasone (DECADRON) 4 MG tablet, Take 2 tablets (8 mg total) by mouth daily. Start the day after chemotherapy ends for 2 days. Take with food., Disp: 30 tablet, Rfl: 1 .  ferrous sulfate 325 (65 FE) MG tablet, Take 325 mg by mouth daily with breakfast., Disp: , Rfl:  .  gabapentin (NEURONTIN) 300 MG capsule, Take 600 mg by mouth at bedtime. , Disp: , Rfl:  .  lidocaine-prilocaine (EMLA) cream, Apply to affected area once, Disp: 30 g, Rfl: 3 .   lisinopril (ZESTRIL) 5 MG tablet, TAKE 1 TABLET BY MOUTH DAILY (Patient taking differently: Take 5 mg by mouth in the morning.), Disp: 90 tablet, Rfl: 2 .  loratadine (CLARITIN) 10 MG tablet, Take 10 mg by mouth daily., Disp: , Rfl:  .  metoprolol succinate (TOPROL-XL) 50 MG 24 hr tablet, Take 50 mg by mouth at bedtime., Disp: , Rfl:  .  Multiple Vitamins-Minerals (ONE-A-DAY 50 PLUS PO), Take 1 tablet by mouth daily with breakfast., Disp: , Rfl:  .  ONETOUCH VERIO test strip, 1 each by Other route daily., Disp: , Rfl: 3 .  polyethylene glycol (MIRALAX / GLYCOLAX) 17 g packet, Take 17 g by mouth daily. (Patient taking differently: Take 17 g by mouth every other day.), Disp: 14 each, Rfl: 0 .  PRILOSEC OTC 20 MG tablet, Take 20 mg by mouth daily at 6 PM., Disp: , Rfl:  .  rosuvastatin (CRESTOR) 10 MG tablet, Take 10 mg by mouth at bedtime., Disp: , Rfl:  .  sitaGLIPtin-metformin (JANUMET) 50-1000 MG tablet, Take 1 tablet by mouth 2 (two) times daily with a meal. , Disp: , Rfl:  .  amoxicillin (AMOXIL) 500 MG capsule, Take 2,000 mg by  mouth See admin instructions. Take 2,000 mg by mouth one hour prior to dental appointments (Patient not taking: Reported on 02/22/2021), Disp: , Rfl:  .  fluticasone (FLONASE) 50 MCG/ACT nasal spray, Place 2 sprays into both nostrils in the morning. (Patient not taking: Reported on 02/22/2021), Disp: , Rfl:  .  LORazepam (ATIVAN) 0.5 MG tablet, Take 1 tablet (0.5 mg total) by mouth every 6 (six) hours as needed (Nausea or vomiting). (Patient not taking: Reported on 02/22/2021), Disp: 30 tablet, Rfl: 0 .  nitroGLYCERIN (NITROSTAT) 0.4 MG SL tablet, Place 1 tablet (0.4 mg total) under the tongue every 5 (five) minutes as needed for chest pain., Disp: 25 tablet, Rfl: 6 .  ondansetron (ZOFRAN) 8 MG tablet, Take 1 tablet (8 mg total) by mouth 2 (two) times daily as needed for refractory nausea / vomiting. Start on the third day after last day of chemotherapy. (Patient not taking:  Reported on 02/22/2021), Disp: 30 tablet, Rfl: 1 .  prochlorperazine (COMPAZINE) 10 MG tablet, Take 1 tablet (10 mg total) by mouth every 6 (six) hours as needed (Nausea or vomiting). (Patient not taking: Reported on 02/22/2021), Disp: 30 tablet, Rfl: 1  Allergies:  Allergies  Allergen Reactions  . Oxycodone Nausea And Vomiting    Past Medical History, Surgical history, Social history, and Family History were reviewed and updated.  Review of Systems: Review of Systems  Constitutional: Negative.   HENT:  Negative.   Eyes: Negative.   Respiratory: Negative.   Cardiovascular: Negative.   Gastrointestinal: Negative.   Endocrine: Negative.   Genitourinary: Negative.    Musculoskeletal: Negative.   Skin: Positive for rash.  Neurological: Negative.   Hematological: Bruises/bleeds easily.  Psychiatric/Behavioral: Negative.     Physical Exam:  weight is 216 lb (98 kg).   Wt Readings from Last 3 Encounters:  02/22/21 216 lb (98 kg)  01/25/21 221 lb (100.2 kg)  01/14/21 220 lb 12.8 oz (100.2 kg)    Physical Exam Vitals reviewed.  HENT:     Head: Normocephalic and atraumatic.  Eyes:     Pupils: Pupils are equal, round, and reactive to light.  Cardiovascular:     Rate and Rhythm: Normal rate and regular rhythm.     Heart sounds: Normal heart sounds.  Pulmonary:     Effort: Pulmonary effort is normal.     Breath sounds: Normal breath sounds.  Abdominal:     General: Bowel sounds are normal.     Palpations: Abdomen is soft.  Musculoskeletal:        General: No tenderness or deformity. Normal range of motion.     Cervical back: Normal range of motion.  Lymphadenopathy:     Cervical: No cervical adenopathy.  Skin:    General: Skin is warm and dry.     Findings: No erythema or rash.     Comments: Skin exam does show some scattered ecchymoses.  I do not see any obvious petechia on his legs.  He has no rashes.  Neurological:     Mental Status: He is alert and oriented to  person, place, and time.  Psychiatric:        Behavior: Behavior normal.        Thought Content: Thought content normal.        Judgment: Judgment normal.      Lab Results  Component Value Date   WBC 0.7 (LL) 02/22/2021   HGB 6.9 (LL) 02/22/2021   HCT 20.8 (L) 02/22/2021   MCV 96.7 02/22/2021  PLT 22 (L) 02/22/2021     Chemistry      Component Value Date/Time   NA 130 (L) 02/22/2021 0950   NA 131 (L) 12/09/2020 1629   K 4.0 02/22/2021 0950   CL 100 02/22/2021 0950   CO2 22 02/22/2021 0950   BUN 20 02/22/2021 0950   BUN 24 12/09/2020 1629   CREATININE 1.36 (H) 02/22/2021 0950      Component Value Date/Time   CALCIUM 9.1 02/22/2021 0950   ALKPHOS 46 02/22/2021 0950   AST 19 02/22/2021 0950   ALT 12 02/22/2021 0950   BILITOT 0.6 02/22/2021 0950      Impression and Plan: Mr. Pembleton is a very nice 75 year old white male.  He has high-grade myelodysplasia.  This clearly has been present for only about 7 months.  He had lab work done back in May 2021 which looked pretty normal.  He definitely has high-grade myelodysplasia.  He is trying to progress to acute myeloid leukemia.  I just do not think we can treat him this week.  I think we have to give him an extra week off.  Again I does have a sense that he is not can tolerate treatment all that well this week.  Again he will be transfused.  We will transfuse him tomorrow.  We will see about getting him back in in 1 week.  We will see how his blood counts look at that time.  I told he and his wife that using this protocol, it takes a good 3 months before we will get to see a response.  I think we will see response by his blood counts.  We are checking his blood counts weekly for right now.  I will see him back in 1 week.      Volanda Napoleon, MD 3/14/20225:29 PM

## 2021-02-22 NOTE — Telephone Encounter (Signed)
appts made per 02/22/21 los and pt will gain a sch at the 02/23/21 blood appt   Theodore Jenkins

## 2021-02-22 NOTE — Telephone Encounter (Signed)
Critical WBC .7, Hgb 6.9, Critical ANC reported by Richardson Landry in lab.  Dr Marin Olp made aware

## 2021-02-23 ENCOUNTER — Inpatient Hospital Stay: Payer: PPO

## 2021-02-23 DIAGNOSIS — D46Z Other myelodysplastic syndromes: Secondary | ICD-10-CM

## 2021-02-23 DIAGNOSIS — Z5111 Encounter for antineoplastic chemotherapy: Secondary | ICD-10-CM | POA: Diagnosis not present

## 2021-02-23 DIAGNOSIS — D649 Anemia, unspecified: Secondary | ICD-10-CM

## 2021-02-23 MED ORDER — SODIUM CHLORIDE 0.9% IV SOLUTION
250.0000 mL | Freq: Once | INTRAVENOUS | Status: AC
  Administered 2021-02-23: 250 mL via INTRAVENOUS
  Filled 2021-02-23: qty 250

## 2021-02-23 MED ORDER — DIPHENHYDRAMINE HCL 25 MG PO CAPS: 25.0000 mg | ORAL_CAPSULE | Freq: Once | ORAL | Status: DC

## 2021-02-23 MED ORDER — HEPARIN SOD (PORK) LOCK FLUSH 100 UNIT/ML IV SOLN
250.0000 [IU] | INTRAVENOUS | Status: AC | PRN
  Administered 2021-02-23: 250 [IU]
  Filled 2021-02-23: qty 5

## 2021-02-23 MED ORDER — ACETAMINOPHEN 325 MG PO TABS: 650.0000 mg | ORAL_TABLET | Freq: Once | ORAL | Status: DC

## 2021-02-23 MED ORDER — SODIUM CHLORIDE 0.9% FLUSH
10.0000 mL | INTRAVENOUS | Status: AC | PRN
  Administered 2021-02-23: 10 mL
  Filled 2021-02-23: qty 10

## 2021-02-23 NOTE — Patient Instructions (Signed)

## 2021-02-24 LAB — TYPE AND SCREEN
ABO/RH(D): A POS
Antibody Screen: NEGATIVE
Unit division: 0
Unit division: 0

## 2021-02-24 LAB — BPAM RBC
Blood Product Expiration Date: 202204072359
Blood Product Expiration Date: 202204092359
ISSUE DATE / TIME: 202203150754
ISSUE DATE / TIME: 202203150754
Unit Type and Rh: 6200
Unit Type and Rh: 6200

## 2021-02-25 ENCOUNTER — Other Ambulatory Visit: Payer: Self-pay | Admitting: Hematology & Oncology

## 2021-03-02 ENCOUNTER — Inpatient Hospital Stay: Payer: PPO

## 2021-03-02 ENCOUNTER — Other Ambulatory Visit: Payer: Self-pay

## 2021-03-02 ENCOUNTER — Telehealth: Payer: Self-pay

## 2021-03-02 ENCOUNTER — Telehealth: Payer: Self-pay | Admitting: *Deleted

## 2021-03-02 ENCOUNTER — Encounter: Payer: Self-pay | Admitting: Hematology & Oncology

## 2021-03-02 ENCOUNTER — Inpatient Hospital Stay (HOSPITAL_BASED_OUTPATIENT_CLINIC_OR_DEPARTMENT_OTHER): Payer: PPO | Admitting: Hematology & Oncology

## 2021-03-02 VITALS — BP 129/54 | HR 79 | Temp 97.6°F | Resp 18 | Wt 214.0 lb

## 2021-03-02 DIAGNOSIS — D46Z Other myelodysplastic syndromes: Secondary | ICD-10-CM

## 2021-03-02 DIAGNOSIS — Z5111 Encounter for antineoplastic chemotherapy: Secondary | ICD-10-CM | POA: Diagnosis not present

## 2021-03-02 LAB — CBC WITH DIFFERENTIAL (CANCER CENTER ONLY)
Abs Immature Granulocytes: 0.11 10*3/uL — ABNORMAL HIGH (ref 0.00–0.07)
Basophils Absolute: 0 10*3/uL (ref 0.0–0.1)
Basophils Relative: 0 %
Eosinophils Absolute: 0 10*3/uL (ref 0.0–0.5)
Eosinophils Relative: 0 %
HCT: 22.4 % — ABNORMAL LOW (ref 39.0–52.0)
Hemoglobin: 7.5 g/dL — ABNORMAL LOW (ref 13.0–17.0)
Immature Granulocytes: 13 %
Lymphocytes Relative: 39 %
Lymphs Abs: 0.3 10*3/uL — ABNORMAL LOW (ref 0.7–4.0)
MCH: 31.3 pg (ref 26.0–34.0)
MCHC: 33.5 g/dL (ref 30.0–36.0)
MCV: 93.3 fL (ref 80.0–100.0)
Monocytes Absolute: 0.2 10*3/uL (ref 0.1–1.0)
Monocytes Relative: 26 %
Neutro Abs: 0.2 10*3/uL — CL (ref 1.7–7.7)
Neutrophils Relative %: 22 %
Platelet Count: 27 10*3/uL — ABNORMAL LOW (ref 150–400)
RBC: 2.4 MIL/uL — ABNORMAL LOW (ref 4.22–5.81)
RDW: 18.4 % — ABNORMAL HIGH (ref 11.5–15.5)
WBC Count: 0.9 10*3/uL — CL (ref 4.0–10.5)
nRBC: 3.5 % — ABNORMAL HIGH (ref 0.0–0.2)

## 2021-03-02 LAB — CMP (CANCER CENTER ONLY)
ALT: 12 U/L (ref 0–44)
AST: 21 U/L (ref 15–41)
Albumin: 4.3 g/dL (ref 3.5–5.0)
Alkaline Phosphatase: 45 U/L (ref 38–126)
Anion gap: 9 (ref 5–15)
BUN: 31 mg/dL — ABNORMAL HIGH (ref 8–23)
CO2: 22 mmol/L (ref 22–32)
Calcium: 9 mg/dL (ref 8.9–10.3)
Chloride: 98 mmol/L (ref 98–111)
Creatinine: 1.34 mg/dL — ABNORMAL HIGH (ref 0.61–1.24)
GFR, Estimated: 56 mL/min — ABNORMAL LOW (ref >60)
Glucose, Bld: 184 mg/dL — ABNORMAL HIGH (ref 70–99)
Potassium: 4 mmol/L (ref 3.5–5.1)
Sodium: 129 mmol/L — ABNORMAL LOW (ref 135–145)
Total Bilirubin: 0.6 mg/dL (ref 0.3–1.2)
Total Protein: 6.9 g/dL (ref 6.5–8.1)

## 2021-03-02 LAB — LACTATE DEHYDROGENASE: LDH: 213 U/L — ABNORMAL HIGH (ref 98–192)

## 2021-03-02 LAB — SAVE SMEAR(SSMR), FOR PROVIDER SLIDE REVIEW

## 2021-03-02 LAB — SAMPLE TO BLOOD BANK

## 2021-03-02 MED ORDER — SODIUM CHLORIDE 0.9% FLUSH
10.0000 mL | INTRAVENOUS | Status: DC | PRN
  Administered 2021-03-02: 10 mL
  Filled 2021-03-02: qty 10

## 2021-03-02 MED ORDER — PALONOSETRON HCL INJECTION 0.25 MG/5ML
INTRAVENOUS | Status: AC
  Filled 2021-03-02: qty 5

## 2021-03-02 MED ORDER — SODIUM CHLORIDE 0.9 % IV SOLN
10.0000 mg | Freq: Once | INTRAVENOUS | Status: AC
  Administered 2021-03-02: 10 mg via INTRAVENOUS
  Filled 2021-03-02: qty 10

## 2021-03-02 MED ORDER — PALONOSETRON HCL INJECTION 0.25 MG/5ML
0.2500 mg | Freq: Once | INTRAVENOUS | Status: AC
  Administered 2021-03-02: 0.25 mg via INTRAVENOUS

## 2021-03-02 MED ORDER — HEPARIN SOD (PORK) LOCK FLUSH 100 UNIT/ML IV SOLN
500.0000 [IU] | Freq: Once | INTRAVENOUS | Status: AC | PRN
  Administered 2021-03-02: 500 [IU]
  Filled 2021-03-02: qty 5

## 2021-03-02 MED ORDER — SODIUM CHLORIDE 0.9 % IV SOLN
75.0000 mg/m2 | Freq: Once | INTRAVENOUS | Status: AC
  Administered 2021-03-02: 165 mg via INTRAVENOUS
  Filled 2021-03-02: qty 16.5

## 2021-03-02 MED ORDER — SODIUM CHLORIDE 0.9 % IV SOLN
Freq: Once | INTRAVENOUS | Status: AC
  Filled 2021-03-02: qty 250

## 2021-03-02 NOTE — Progress Notes (Signed)
Hematology and Oncology Follow Up Visit  Theodore Jenkins [id number] Sep 21, 1946 75 y.o. 03/02/2021   Principle Diagnosis:   MDS -- RAEB-2  --  Normal cytogenetics;  IPSS = 5  Current Therapy:    Vidaza 75 mg/m2 IV q day d1-7  -- s/p cycle #1- start on 01/25/2021     Interim History:  Theodore Jenkins is in for follow-up.  He looks pretty good.  He feels good.  He has had no further problems with epistaxis.  His appetite is doing okay.  He is holding his weight.  Theodore Jenkins had a good weekend.  He was out doing yard work.  He has had no fever.  He has had no obvious nausea or vomiting.  He has had no change in bowel or bladder habits.  There has been no problems with rashes.  He has had no issues with respect to having the COVID 2 months ago.  We will probably have to transfuse him at some point in the future.  He got transfused a week ago.  I think that he is tolerated treatment quite nicely today.  Overall, his performance status is ECOG 1.       Medications:  Current Outpatient Medications:  .  ascorbic acid (VITAMIN C) 500 MG tablet, Take 500 mg by mouth daily with breakfast., Disp: , Rfl:  .  chlorhexidine (PERIDEX) 0.12 % solution, Use as directed 15 mLs in the mouth or throat 2 (two) times daily., Disp: 450 mL, Rfl: 3 .  Cyanocobalamin (VITAMIN B 12) 500 MCG TABS, Take 500 mcg by mouth daily., Disp: , Rfl:  .  dexamethasone (DECADRON) 4 MG tablet, Take 2 tablets (8 mg total) by mouth daily. Start the day after chemotherapy ends for 2 days. Take with food., Disp: 30 tablet, Rfl: 1 .  ferrous sulfate 325 (65 FE) MG tablet, Take 325 mg by mouth daily with breakfast., Disp: , Rfl:  .  gabapentin (NEURONTIN) 300 MG capsule, Take 600 mg by mouth at bedtime. , Disp: , Rfl:  .  lidocaine-prilocaine (EMLA) cream, Apply to affected area once, Disp: 30 g, Rfl: 3 .  lisinopril (ZESTRIL) 5 MG tablet, TAKE 1 TABLET BY MOUTH DAILY (Patient taking differently: Take 5 mg by mouth in the morning.), Disp:  90 tablet, Rfl: 2 .  loratadine (CLARITIN) 10 MG tablet, Take 10 mg by mouth daily., Disp: , Rfl:  .  metoprolol succinate (TOPROL-XL) 50 MG 24 hr tablet, Take 50 mg by mouth at bedtime., Disp: , Rfl:  .  Multiple Vitamins-Minerals (ONE-A-DAY 50 PLUS PO), Take 1 tablet by mouth daily with breakfast., Disp: , Rfl:  .  ONETOUCH VERIO test strip, 1 each by Other route daily., Disp: , Rfl: 3 .  PRILOSEC OTC 20 MG tablet, Take 20 mg by mouth daily at 6 PM., Disp: , Rfl:  .  rosuvastatin (CRESTOR) 10 MG tablet, Take 10 mg by mouth at bedtime., Disp: , Rfl:  .  sitaGLIPtin-metformin (JANUMET) 50-1000 MG tablet, Take 1 tablet by mouth 2 (two) times daily with a meal. , Disp: , Rfl:  .  acetaminophen (TYLENOL) 500 MG tablet, Take 1,000 mg by mouth every 8 (eight) hours as needed for mild pain (or headaches). (Patient not taking: Reported on 03/02/2021), Disp: , Rfl:  .  amoxicillin (AMOXIL) 500 MG capsule, Take 2,000 mg by mouth See admin instructions. Take 2,000 mg by mouth one hour prior to dental appointments (Patient not taking: No sig reported), Disp: , Rfl:  .  fluticasone (FLONASE) 50 MCG/ACT nasal spray, Place 2 sprays into both nostrils in the morning. (Patient not taking: No sig reported), Disp: , Rfl:  .  LORazepam (ATIVAN) 0.5 MG tablet, Take 1 tablet (0.5 mg total) by mouth every 6 (six) hours as needed (Nausea or vomiting). (Patient not taking: No sig reported), Disp: 30 tablet, Rfl: 0 .  nitroGLYCERIN (NITROSTAT) 0.4 MG SL tablet, Place 1 tablet (0.4 mg total) under the tongue every 5 (five) minutes as needed for chest pain., Disp: 25 tablet, Rfl: 6 .  ondansetron (ZOFRAN) 8 MG tablet, Take 1 tablet (8 mg total) by mouth 2 (two) times daily as needed for refractory nausea / vomiting. Start on the third day after last day of chemotherapy. (Patient not taking: No sig reported), Disp: 30 tablet, Rfl: 1 .  polyethylene glycol (MIRALAX / GLYCOLAX) 17 g packet, Take 17 g by mouth daily. (Patient not  taking: Reported on 03/02/2021), Disp: 14 each, Rfl: 0 .  prochlorperazine (COMPAZINE) 10 MG tablet, Take 1 tablet (10 mg total) by mouth every 6 (six) hours as needed (Nausea or vomiting). (Patient not taking: No sig reported), Disp: 30 tablet, Rfl: 1  Allergies:  Allergies  Allergen Reactions  . Oxycodone Nausea And Vomiting    Past Medical History, Surgical history, Social history, and Family History were reviewed and updated.  Review of Systems: Review of Systems  Constitutional: Negative.   HENT:  Negative.   Eyes: Negative.   Respiratory: Negative.   Cardiovascular: Negative.   Gastrointestinal: Negative.   Endocrine: Negative.   Genitourinary: Negative.    Musculoskeletal: Negative.   Skin: Positive for rash.  Neurological: Negative.   Hematological: Bruises/bleeds easily.  Psychiatric/Behavioral: Negative.     Physical Exam:  weight is 214 lb (97.1 kg). His oral temperature is 97.6 F (36.4 C). His blood pressure is 129/54 (abnormal) and his pulse is 79. His respiration is 18 and oxygen saturation is 99%.   Wt Readings from Last 3 Encounters:  03/02/21 214 lb (97.1 kg)  02/22/21 216 lb (98 kg)  01/25/21 221 lb (100.2 kg)    Physical Exam Vitals reviewed.  HENT:     Head: Normocephalic and atraumatic.  Eyes:     Pupils: Pupils are equal, round, and reactive to light.  Cardiovascular:     Rate and Rhythm: Normal rate and regular rhythm.     Heart sounds: Normal heart sounds.  Pulmonary:     Effort: Pulmonary effort is normal.     Breath sounds: Normal breath sounds.  Abdominal:     General: Bowel sounds are normal.     Palpations: Abdomen is soft.  Musculoskeletal:        General: No tenderness or deformity. Normal range of motion.     Cervical back: Normal range of motion.  Lymphadenopathy:     Cervical: No cervical adenopathy.  Skin:    General: Skin is warm and dry.     Findings: No erythema or rash.     Comments: Skin exam does show some scattered  ecchymoses.  I do not see any obvious petechia on his legs.  He has no rashes.  Neurological:     Mental Status: He is alert and oriented to person, place, and time.  Psychiatric:        Behavior: Behavior normal.        Thought Content: Thought content normal.        Judgment: Judgment normal.      Lab Results  Component Value Date   WBC 0.9 (LL) 03/02/2021   HGB 7.5 (L) 03/02/2021   HCT 22.4 (L) 03/02/2021   MCV 93.3 03/02/2021   PLT 27 (L) 03/02/2021     Chemistry      Component Value Date/Time   NA 130 (L) 02/22/2021 0950   NA 131 (L) 12/09/2020 1629   K 4.0 02/22/2021 0950   CL 100 02/22/2021 0950   CO2 22 02/22/2021 0950   BUN 20 02/22/2021 0950   BUN 24 12/09/2020 1629   CREATININE 1.36 (H) 02/22/2021 0950      Component Value Date/Time   CALCIUM 9.1 02/22/2021 0950   ALKPHOS 46 02/22/2021 0950   AST 19 02/22/2021 0950   ALT 12 02/22/2021 0950   BILITOT 0.6 02/22/2021 0950      Impression and Plan: Theodore Jenkins is a very nice 75 year old white male.  He has high-grade myelodysplasia.  This clearly has been present for only about 7 months.  He had lab work done back in May 2021 which looked pretty normal.  He definitely has high-grade myelodysplasia.  He is trying to progress to acute myeloid leukemia.  I we will go ahead with a second cycle of Vidaza.  At some point, we may have to think about adding venetoclax.  We made to think about doing this after we do a bone marrow test on him.  I probably we do a bone marrow biopsy on him after the third cycle of treatment.  We are checking his labs weekly.  I think we have to keep doing this.  I really am happy that his quality life is doing pretty well.  Having the nose bleeding cauterized really has helped.  I will see him back myself in 4 weeks.  In the meantime, he will come in weekly for lab work.         Volanda Napoleon, MD 3/22/20229:30 AM

## 2021-03-02 NOTE — Telephone Encounter (Signed)
appts made per 03/02/21 los and pt will gain appts at 3/23 appt    Margaree Sandhu

## 2021-03-02 NOTE — Progress Notes (Signed)
Okay to treat today with ANC 0.2 per Dr. Marin Olp.

## 2021-03-02 NOTE — Patient Instructions (Signed)
Azacitidine suspension for injection (subcutaneous use) What is this medicine? AZACITIDINE (ay za SITE i deen) is a chemotherapy drug. This medicine reduces the growth of cancer cells and can suppress the immune system. It is used for treating myelodysplastic syndrome or some types of leukemia. This medicine may be used for other purposes; ask your health care provider or pharmacist if you have questions. COMMON BRAND NAME(S): Vidaza What should I tell my health care provider before I take this medicine? They need to know if you have any of these conditions:  kidney disease  liver disease  liver tumors  an unusual or allergic reaction to azacitidine, mannitol, other medicines, foods, dyes, or preservatives  pregnant or trying to get pregnant  breast-feeding How should I use this medicine? This medicine is for injection under the skin. It is administered in a hospital or clinic by a specially trained health care professional. Talk to your pediatrician regarding the use of this medicine in children. While this drug may be prescribed for selected conditions, precautions do apply. Overdosage: If you think you have taken too much of this medicine contact a poison control center or emergency room at once. NOTE: This medicine is only for you. Do not share this medicine with others. What if I miss a dose? It is important not to miss your dose. Call your doctor or health care professional if you are unable to keep an appointment. What may interact with this medicine? Interactions have not been studied. Give your health care provider a list of all the medicines, herbs, non-prescription drugs, or dietary supplements you use. Also tell them if you smoke, drink alcohol, or use illegal drugs. Some items may interact with your medicine. This list may not describe all possible interactions. Give your health care provider a list of all the medicines, herbs, non-prescription drugs, or dietary supplements  you use. Also tell them if you smoke, drink alcohol, or use illegal drugs. Some items may interact with your medicine. What should I watch for while using this medicine? Visit your doctor for checks on your progress. This drug may make you feel generally unwell. This is not uncommon, as chemotherapy can affect healthy cells as well as cancer cells. Report any side effects. Continue your course of treatment even though you feel ill unless your doctor tells you to stop. In some cases, you may be given additional medicines to help with side effects. Follow all directions for their use. Call your doctor or health care professional for advice if you get a fever, chills or sore throat, or other symptoms of a cold or flu. Do not treat yourself. This drug decreases your body's ability to fight infections. Try to avoid being around people who are sick. This medicine may increase your risk to bruise or bleed. Call your doctor or health care professional if you notice any unusual bleeding. You may need blood work done while you are taking this medicine. Do not become pregnant while taking this medicine and for 6 months after the last dose. Women should inform their doctor if they wish to become pregnant or think they might be pregnant. Men should not father a child while taking this medicine and for 3 months after the last dose. There is a potential for serious side effects to an unborn child. Talk to your health care professional or pharmacist for more information. Do not breast-feed an infant while taking this medicine and for 1 week after the last dose. This medicine may interfere with   the ability to have a child. Talk with your doctor or health care professional if you are concerned about your fertility. What side effects may I notice from receiving this medicine? Side effects that you should report to your doctor or health care professional as soon as possible:  allergic reactions like skin rash, itching or  hives, swelling of the face, lips, or tongue  low blood counts - this medicine may decrease the number of white blood cells, red blood cells and platelets. You may be at increased risk for infections and bleeding.  signs of infection - fever or chills, cough, sore throat, pain passing urine  signs of decreased platelets or bleeding - bruising, pinpoint red spots on the skin, black, tarry stools, blood in the urine  signs of decreased red blood cells - unusually weak or tired, fainting spells, lightheadedness  signs and symptoms of kidney injury like trouble passing urine or change in the amount of urine  signs and symptoms of liver injury like dark yellow or brown urine; general ill feeling or flu-like symptoms; light-colored stools; loss of appetite; nausea; right upper belly pain; unusually weak or tired; yellowing of the eyes or skin Side effects that usually do not require medical attention (report to your doctor or health care professional if they continue or are bothersome):  constipation  diarrhea  nausea, vomiting  pain or redness at the injection site  unusually weak or tired This list may not describe all possible side effects. Call your doctor for medical advice about side effects. You may report side effects to FDA at 1-800-FDA-1088. Where should I keep my medicine? This drug is given in a hospital or clinic and will not be stored at home. NOTE: This sheet is a summary. It may not cover all possible information. If you have questions about this medicine, talk to your doctor, pharmacist, or health care provider.  2021 Elsevier/Gold Standard (2016-12-27 14:37:51)  

## 2021-03-02 NOTE — Patient Instructions (Signed)
Implanted Port Insertion, Care After This sheet gives you information about how to care for yourself after your procedure. Your health care provider may also give you more specific instructions. If you have problems or questions, contact your health care provider. What can I expect after the procedure? After the procedure, it is common to have:  Discomfort at the port insertion site.  Bruising on the skin over the port. This should improve over 3-4 days. Follow these instructions at home: Port care  After your port is placed, you will get a manufacturer's information card. The card has information about your port. Keep this card with you at all times.  Take care of the port as told by your health care provider. Ask your health care provider if you or a family member can get training for taking care of the port at home. A home health care nurse may also take care of the port.  Make sure to remember what type of port you have. Incision care  Follow instructions from your health care provider about how to take care of your port insertion site. Make sure you: ? Wash your hands with soap and water before and after you change your bandage (dressing). If soap and water are not available, use hand sanitizer. ? Change your dressing as told by your health care provider. ? Leave stitches (sutures), skin glue, or adhesive strips in place. These skin closures may need to stay in place for 2 weeks or longer. If adhesive strip edges start to loosen and curl up, you may trim the loose edges. Do not remove adhesive strips completely unless your health care provider tells you to do that.  Check your port insertion site every day for signs of infection. Check for: ? Redness, swelling, or pain. ? Fluid or blood. ? Warmth. ? Pus or a bad smell.      Activity  Return to your normal activities as told by your health care provider. Ask your health care provider what activities are safe for you.  Do not  lift anything that is heavier than 10 lb (4.5 kg), or the limit that you are told, until your health care provider says that it is safe. General instructions  Take over-the-counter and prescription medicines only as told by your health care provider.  Do not take baths, swim, or use a hot tub until your health care provider approves. Ask your health care provider if you may take showers. You may only be allowed to take sponge baths.  Do not drive for 24 hours if you were given a sedative during your procedure.  Wear a medical alert bracelet in case of an emergency. This will tell any health care providers that you have a port.  Keep all follow-up visits as told by your health care provider. This is important. Contact a health care provider if:  You cannot flush your port with saline as directed, or you cannot draw blood from the port.  You have a fever or chills.  You have redness, swelling, or pain around your port insertion site.  You have fluid or blood coming from your port insertion site.  Your port insertion site feels warm to the touch.  You have pus or a bad smell coming from the port insertion site. Get help right away if:  You have chest pain or shortness of breath.  You have bleeding from your port that you cannot control. Summary  Take care of the port as told by your   health care provider. Keep the manufacturer's information card with you at all times.  Change your dressing as told by your health care provider.  Contact a health care provider if you have a fever or chills or if you have redness, swelling, or pain around your port insertion site.  Keep all follow-up visits as told by your health care provider. This information is not intended to replace advice given to you by your health care provider. Make sure you discuss any questions you have with your health care provider. Document Revised: 06/26/2018 Document Reviewed: 06/26/2018 Elsevier Patient Education   2021 Elsevier Inc.  

## 2021-03-02 NOTE — Telephone Encounter (Signed)
Dr. Marin Olp notified of HGB-7.5, WBC-0.9, platelet count-27 and ANC-0.2.  No new orders received at this time.

## 2021-03-03 ENCOUNTER — Inpatient Hospital Stay: Payer: PPO

## 2021-03-03 VITALS — BP 115/48 | HR 87 | Temp 98.5°F | Resp 17

## 2021-03-03 DIAGNOSIS — D46Z Other myelodysplastic syndromes: Secondary | ICD-10-CM

## 2021-03-03 DIAGNOSIS — Z5111 Encounter for antineoplastic chemotherapy: Secondary | ICD-10-CM | POA: Diagnosis not present

## 2021-03-03 MED ORDER — HEPARIN SOD (PORK) LOCK FLUSH 100 UNIT/ML IV SOLN
500.0000 [IU] | Freq: Once | INTRAVENOUS | Status: AC | PRN
  Administered 2021-03-03: 500 [IU]
  Filled 2021-03-03: qty 5

## 2021-03-03 MED ORDER — SODIUM CHLORIDE 0.9 % IV SOLN
Freq: Once | INTRAVENOUS | Status: AC
  Filled 2021-03-03: qty 250

## 2021-03-03 MED ORDER — SODIUM CHLORIDE 0.9% FLUSH
10.0000 mL | INTRAVENOUS | Status: DC | PRN
  Administered 2021-03-03: 10 mL
  Filled 2021-03-03: qty 10

## 2021-03-03 MED ORDER — SODIUM CHLORIDE 0.9 % IV SOLN
75.0000 mg/m2 | Freq: Once | INTRAVENOUS | Status: AC
  Administered 2021-03-03: 165 mg via INTRAVENOUS
  Filled 2021-03-03: qty 16.5

## 2021-03-03 MED ORDER — SODIUM CHLORIDE 0.9 % IV SOLN
10.0000 mg | Freq: Once | INTRAVENOUS | Status: AC
  Administered 2021-03-03: 10 mg via INTRAVENOUS
  Filled 2021-03-03: qty 10

## 2021-03-03 MED ORDER — SODIUM CHLORIDE 0.9% FLUSH
3.0000 mL | INTRAVENOUS | Status: DC | PRN
  Filled 2021-03-03: qty 10

## 2021-03-03 NOTE — Patient Instructions (Signed)
Azacitidine suspension for injection (subcutaneous use) What is this medicine? AZACITIDINE (ay za SITE i deen) is a chemotherapy drug. This medicine reduces the growth of cancer cells and can suppress the immune system. It is used for treating myelodysplastic syndrome or some types of leukemia. This medicine may be used for other purposes; ask your health care provider or pharmacist if you have questions. COMMON BRAND NAME(S): Vidaza What should I tell my health care provider before I take this medicine? They need to know if you have any of these conditions:  kidney disease  liver disease  liver tumors  an unusual or allergic reaction to azacitidine, mannitol, other medicines, foods, dyes, or preservatives  pregnant or trying to get pregnant  breast-feeding How should I use this medicine? This medicine is for injection under the skin. It is administered in a hospital or clinic by a specially trained health care professional. Talk to your pediatrician regarding the use of this medicine in children. While this drug may be prescribed for selected conditions, precautions do apply. Overdosage: If you think you have taken too much of this medicine contact a poison control center or emergency room at once. NOTE: This medicine is only for you. Do not share this medicine with others. What if I miss a dose? It is important not to miss your dose. Call your doctor or health care professional if you are unable to keep an appointment. What may interact with this medicine? Interactions have not been studied. Give your health care provider a list of all the medicines, herbs, non-prescription drugs, or dietary supplements you use. Also tell them if you smoke, drink alcohol, or use illegal drugs. Some items may interact with your medicine. This list may not describe all possible interactions. Give your health care provider a list of all the medicines, herbs, non-prescription drugs, or dietary supplements  you use. Also tell them if you smoke, drink alcohol, or use illegal drugs. Some items may interact with your medicine. What should I watch for while using this medicine? Visit your doctor for checks on your progress. This drug may make you feel generally unwell. This is not uncommon, as chemotherapy can affect healthy cells as well as cancer cells. Report any side effects. Continue your course of treatment even though you feel ill unless your doctor tells you to stop. In some cases, you may be given additional medicines to help with side effects. Follow all directions for their use. Call your doctor or health care professional for advice if you get a fever, chills or sore throat, or other symptoms of a cold or flu. Do not treat yourself. This drug decreases your body's ability to fight infections. Try to avoid being around people who are sick. This medicine may increase your risk to bruise or bleed. Call your doctor or health care professional if you notice any unusual bleeding. You may need blood work done while you are taking this medicine. Do not become pregnant while taking this medicine and for 6 months after the last dose. Women should inform their doctor if they wish to become pregnant or think they might be pregnant. Men should not father a child while taking this medicine and for 3 months after the last dose. There is a potential for serious side effects to an unborn child. Talk to your health care professional or pharmacist for more information. Do not breast-feed an infant while taking this medicine and for 1 week after the last dose. This medicine may interfere with   the ability to have a child. Talk with your doctor or health care professional if you are concerned about your fertility. What side effects may I notice from receiving this medicine? Side effects that you should report to your doctor or health care professional as soon as possible:  allergic reactions like skin rash, itching or  hives, swelling of the face, lips, or tongue  low blood counts - this medicine may decrease the number of white blood cells, red blood cells and platelets. You may be at increased risk for infections and bleeding.  signs of infection - fever or chills, cough, sore throat, pain passing urine  signs of decreased platelets or bleeding - bruising, pinpoint red spots on the skin, black, tarry stools, blood in the urine  signs of decreased red blood cells - unusually weak or tired, fainting spells, lightheadedness  signs and symptoms of kidney injury like trouble passing urine or change in the amount of urine  signs and symptoms of liver injury like dark yellow or brown urine; general ill feeling or flu-like symptoms; light-colored stools; loss of appetite; nausea; right upper belly pain; unusually weak or tired; yellowing of the eyes or skin Side effects that usually do not require medical attention (report to your doctor or health care professional if they continue or are bothersome):  constipation  diarrhea  nausea, vomiting  pain or redness at the injection site  unusually weak or tired This list may not describe all possible side effects. Call your doctor for medical advice about side effects. You may report side effects to FDA at 1-800-FDA-1088. Where should I keep my medicine? This drug is given in a hospital or clinic and will not be stored at home. NOTE: This sheet is a summary. It may not cover all possible information. If you have questions about this medicine, talk to your doctor, pharmacist, or health care provider.  2021 Elsevier/Gold Standard (2016-12-27 14:37:51)  

## 2021-03-04 ENCOUNTER — Other Ambulatory Visit: Payer: Self-pay

## 2021-03-04 ENCOUNTER — Inpatient Hospital Stay: Payer: PPO

## 2021-03-04 ENCOUNTER — Other Ambulatory Visit: Payer: Self-pay | Admitting: Hematology & Oncology

## 2021-03-04 DIAGNOSIS — D46Z Other myelodysplastic syndromes: Secondary | ICD-10-CM

## 2021-03-04 DIAGNOSIS — Z5111 Encounter for antineoplastic chemotherapy: Secondary | ICD-10-CM | POA: Diagnosis not present

## 2021-03-04 LAB — CBC WITH DIFFERENTIAL (CANCER CENTER ONLY)
Abs Immature Granulocytes: 0.1 10*3/uL — ABNORMAL HIGH (ref 0.00–0.07)
Band Neutrophils: 0 %
Basophils Absolute: 0 10*3/uL (ref 0.0–0.1)
Basophils Relative: 0 %
Eosinophils Absolute: 0 10*3/uL (ref 0.0–0.5)
Eosinophils Relative: 0 %
HCT: 21.1 % — ABNORMAL LOW (ref 39.0–52.0)
Hemoglobin: 7 g/dL — ABNORMAL LOW (ref 13.0–17.0)
Immature Granulocytes: 9 %
Lymphocytes Relative: 40 %
Lymphs Abs: 0.6 10*3/uL — ABNORMAL LOW (ref 0.7–4.0)
MCH: 31.3 pg (ref 26.0–34.0)
MCHC: 33.2 g/dL (ref 30.0–36.0)
MCV: 94.2 fL (ref 80.0–100.0)
Monocytes Absolute: 0.4 10*3/uL (ref 0.1–1.0)
Monocytes Relative: 22 %
Neutro Abs: 0.5 10*3/uL — ABNORMAL LOW (ref 1.7–7.7)
Neutrophils Relative %: 29 %
Platelet Count: 37 10*3/uL — ABNORMAL LOW (ref 150–400)
RBC: 2.24 MIL/uL — ABNORMAL LOW (ref 4.22–5.81)
RDW: 18.8 % — ABNORMAL HIGH (ref 11.5–15.5)
WBC Count: 1.6 10*3/uL — ABNORMAL LOW (ref 4.0–10.5)
nRBC: 5.7 % — ABNORMAL HIGH (ref 0.0–0.2)
nRBC: 6 /100 WBC — ABNORMAL HIGH

## 2021-03-04 LAB — BASIC METABOLIC PANEL - CANCER CENTER ONLY
Anion gap: 9 (ref 5–15)
BUN: 33 mg/dL — ABNORMAL HIGH (ref 8–23)
CO2: 22 mmol/L (ref 22–32)
Calcium: 9.3 mg/dL (ref 8.9–10.3)
Chloride: 98 mmol/L (ref 98–111)
Creatinine: 1.47 mg/dL — ABNORMAL HIGH (ref 0.61–1.24)
GFR, Estimated: 50 mL/min — ABNORMAL LOW (ref >60)
Glucose, Bld: 196 mg/dL — ABNORMAL HIGH (ref 70–99)
Potassium: 4.1 mmol/L (ref 3.5–5.1)
Sodium: 129 mmol/L — ABNORMAL LOW (ref 135–145)

## 2021-03-04 LAB — PREPARE RBC (CROSSMATCH)

## 2021-03-04 MED ORDER — SODIUM CHLORIDE 0.9% FLUSH
10.0000 mL | INTRAVENOUS | Status: AC | PRN
  Administered 2021-03-04: 10 mL
  Filled 2021-03-04: qty 10

## 2021-03-04 MED ORDER — HEPARIN SOD (PORK) LOCK FLUSH 100 UNIT/ML IV SOLN
500.0000 [IU] | Freq: Once | INTRAVENOUS | Status: DC | PRN
  Filled 2021-03-04: qty 5

## 2021-03-04 MED ORDER — DIPHENHYDRAMINE HCL 25 MG PO CAPS
ORAL_CAPSULE | ORAL | Status: AC
  Filled 2021-03-04: qty 1

## 2021-03-04 MED ORDER — PALONOSETRON HCL INJECTION 0.25 MG/5ML
INTRAVENOUS | Status: AC
  Filled 2021-03-04: qty 5

## 2021-03-04 MED ORDER — PALONOSETRON HCL INJECTION 0.25 MG/5ML
0.2500 mg | Freq: Once | INTRAVENOUS | Status: AC
  Administered 2021-03-04: 0.25 mg via INTRAVENOUS

## 2021-03-04 MED ORDER — SODIUM CHLORIDE 0.9 % IV SOLN
10.0000 mg | Freq: Once | INTRAVENOUS | Status: AC
  Administered 2021-03-04: 10 mg via INTRAVENOUS
  Filled 2021-03-04: qty 10

## 2021-03-04 MED ORDER — ACETAMINOPHEN 325 MG PO TABS
650.0000 mg | ORAL_TABLET | Freq: Once | ORAL | Status: AC
  Administered 2021-03-04: 650 mg via ORAL

## 2021-03-04 MED ORDER — SODIUM CHLORIDE 0.9 % IV SOLN
Freq: Once | INTRAVENOUS | Status: AC
Start: 2021-03-04 — End: 2021-03-04
  Filled 2021-03-04: qty 250

## 2021-03-04 MED ORDER — DIPHENHYDRAMINE HCL 25 MG PO CAPS
25.0000 mg | ORAL_CAPSULE | Freq: Once | ORAL | Status: AC
  Administered 2021-03-04: 25 mg via ORAL

## 2021-03-04 MED ORDER — ACETAMINOPHEN 325 MG PO TABS
ORAL_TABLET | ORAL | Status: AC
  Filled 2021-03-04: qty 2

## 2021-03-04 MED ORDER — SODIUM CHLORIDE 0.9% FLUSH
10.0000 mL | INTRAVENOUS | Status: DC | PRN
  Filled 2021-03-04: qty 10

## 2021-03-04 MED ORDER — AZACITIDINE CHEMO INJECTION 100 MG
75.0000 mg/m2 | Freq: Once | INTRAMUSCULAR | Status: AC
  Administered 2021-03-04: 165 mg via INTRAVENOUS
  Filled 2021-03-04: qty 16.5

## 2021-03-04 MED ORDER — HEPARIN SOD (PORK) LOCK FLUSH 100 UNIT/ML IV SOLN
500.0000 [IU] | Freq: Every day | INTRAVENOUS | Status: AC | PRN
  Administered 2021-03-04: 500 [IU]
  Filled 2021-03-04: qty 5

## 2021-03-04 NOTE — Patient Instructions (Signed)
Azacitidine suspension for injection (subcutaneous use) What is this medicine? AZACITIDINE (ay za SITE i deen) is a chemotherapy drug. This medicine reduces the growth of cancer cells and can suppress the immune system. It is used for treating myelodysplastic syndrome or some types of leukemia. This medicine may be used for other purposes; ask your health care provider or pharmacist if you have questions. COMMON BRAND NAME(S): Vidaza What should I tell my health care provider before I take this medicine? They need to know if you have any of these conditions:  kidney disease  liver disease  liver tumors  an unusual or allergic reaction to azacitidine, mannitol, other medicines, foods, dyes, or preservatives  pregnant or trying to get pregnant  breast-feeding How should I use this medicine? This medicine is for injection under the skin. It is administered in a hospital or clinic by a specially trained health care professional. Talk to your pediatrician regarding the use of this medicine in children. While this drug may be prescribed for selected conditions, precautions do apply. Overdosage: If you think you have taken too much of this medicine contact a poison control center or emergency room at once. NOTE: This medicine is only for you. Do not share this medicine with others. What if I miss a dose? It is important not to miss your dose. Call your doctor or health care professional if you are unable to keep an appointment. What may interact with this medicine? Interactions have not been studied. Give your health care provider a list of all the medicines, herbs, non-prescription drugs, or dietary supplements you use. Also tell them if you smoke, drink alcohol, or use illegal drugs. Some items may interact with your medicine. This list may not describe all possible interactions. Give your health care provider a list of all the medicines, herbs, non-prescription drugs, or dietary supplements  you use. Also tell them if you smoke, drink alcohol, or use illegal drugs. Some items may interact with your medicine. What should I watch for while using this medicine? Visit your doctor for checks on your progress. This drug may make you feel generally unwell. This is not uncommon, as chemotherapy can affect healthy cells as well as cancer cells. Report any side effects. Continue your course of treatment even though you feel ill unless your doctor tells you to stop. In some cases, you may be given additional medicines to help with side effects. Follow all directions for their use. Call your doctor or health care professional for advice if you get a fever, chills or sore throat, or other symptoms of a cold or flu. Do not treat yourself. This drug decreases your body's ability to fight infections. Try to avoid being around people who are sick. This medicine may increase your risk to bruise or bleed. Call your doctor or health care professional if you notice any unusual bleeding. You may need blood work done while you are taking this medicine. Do not become pregnant while taking this medicine and for 6 months after the last dose. Women should inform their doctor if they wish to become pregnant or think they might be pregnant. Men should not father a child while taking this medicine and for 3 months after the last dose. There is a potential for serious side effects to an unborn child. Talk to your health care professional or pharmacist for more information. Do not breast-feed an infant while taking this medicine and for 1 week after the last dose. This medicine may interfere with   the ability to have a child. Talk with your doctor or health care professional if you are concerned about your fertility. What side effects may I notice from receiving this medicine? Side effects that you should report to your doctor or health care professional as soon as possible:  allergic reactions like skin rash, itching or  hives, swelling of the face, lips, or tongue  low blood counts - this medicine may decrease the number of white blood cells, red blood cells and platelets. You may be at increased risk for infections and bleeding.  signs of infection - fever or chills, cough, sore throat, pain passing urine  signs of decreased platelets or bleeding - bruising, pinpoint red spots on the skin, black, tarry stools, blood in the urine  signs of decreased red blood cells - unusually weak or tired, fainting spells, lightheadedness  signs and symptoms of kidney injury like trouble passing urine or change in the amount of urine  signs and symptoms of liver injury like dark yellow or brown urine; general ill feeling or flu-like symptoms; light-colored stools; loss of appetite; nausea; right upper belly pain; unusually weak or tired; yellowing of the eyes or skin Side effects that usually do not require medical attention (report to your doctor or health care professional if they continue or are bothersome):  constipation  diarrhea  nausea, vomiting  pain or redness at the injection site  unusually weak or tired This list may not describe all possible side effects. Call your doctor for medical advice about side effects. You may report side effects to FDA at 1-800-FDA-1088. Where should I keep my medicine? This drug is given in a hospital or clinic and will not be stored at home. NOTE: This sheet is a summary. It may not cover all possible information. If you have questions about this medicine, talk to your doctor, pharmacist, or health care provider.  2021 Elsevier/Gold Standard (2016-12-27 14:37:51)  

## 2021-03-04 NOTE — Patient Instructions (Signed)

## 2021-03-05 ENCOUNTER — Inpatient Hospital Stay: Payer: PPO

## 2021-03-05 ENCOUNTER — Ambulatory Visit: Payer: PPO

## 2021-03-05 VITALS — BP 120/53 | HR 75 | Temp 98.3°F | Resp 17

## 2021-03-05 DIAGNOSIS — Z5111 Encounter for antineoplastic chemotherapy: Secondary | ICD-10-CM | POA: Diagnosis not present

## 2021-03-05 DIAGNOSIS — G4733 Obstructive sleep apnea (adult) (pediatric): Secondary | ICD-10-CM | POA: Diagnosis not present

## 2021-03-05 DIAGNOSIS — D46Z Other myelodysplastic syndromes: Secondary | ICD-10-CM

## 2021-03-05 DIAGNOSIS — Z9989 Dependence on other enabling machines and devices: Secondary | ICD-10-CM | POA: Diagnosis not present

## 2021-03-05 DIAGNOSIS — M545 Low back pain, unspecified: Secondary | ICD-10-CM | POA: Diagnosis not present

## 2021-03-05 LAB — TYPE AND SCREEN
ABO/RH(D): A POS
Antibody Screen: NEGATIVE
Unit division: 0

## 2021-03-05 LAB — BPAM RBC
Blood Product Expiration Date: 202204182359
ISSUE DATE / TIME: 202203241224
Unit Type and Rh: 6200

## 2021-03-05 MED ORDER — HEPARIN SOD (PORK) LOCK FLUSH 100 UNIT/ML IV SOLN
500.0000 [IU] | Freq: Once | INTRAVENOUS | Status: AC | PRN
  Administered 2021-03-05: 500 [IU]
  Filled 2021-03-05: qty 5

## 2021-03-05 MED ORDER — SODIUM CHLORIDE 0.9 % IV SOLN
Freq: Once | INTRAVENOUS | Status: AC
  Filled 2021-03-05: qty 250

## 2021-03-05 MED ORDER — SODIUM CHLORIDE 0.9 % IV SOLN
75.0000 mg/m2 | Freq: Once | INTRAVENOUS | Status: AC
  Administered 2021-03-05: 165 mg via INTRAVENOUS
  Filled 2021-03-05: qty 16.5

## 2021-03-05 MED ORDER — SODIUM CHLORIDE 0.9% FLUSH
10.0000 mL | INTRAVENOUS | Status: DC | PRN
  Administered 2021-03-05: 10 mL
  Filled 2021-03-05: qty 10

## 2021-03-05 MED ORDER — SODIUM CHLORIDE 0.9 % IV SOLN
10.0000 mg | Freq: Once | INTRAVENOUS | Status: AC
  Administered 2021-03-05: 10 mg via INTRAVENOUS
  Filled 2021-03-05: qty 10

## 2021-03-05 NOTE — Patient Instructions (Signed)
Azacitidine suspension for injection (subcutaneous use) What is this medicine? AZACITIDINE (ay za SITE i deen) is a chemotherapy drug. This medicine reduces the growth of cancer cells and can suppress the immune system. It is used for treating myelodysplastic syndrome or some types of leukemia. This medicine may be used for other purposes; ask your health care provider or pharmacist if you have questions. COMMON BRAND NAME(S): Vidaza What should I tell my health care provider before I take this medicine? They need to know if you have any of these conditions:  kidney disease  liver disease  liver tumors  an unusual or allergic reaction to azacitidine, mannitol, other medicines, foods, dyes, or preservatives  pregnant or trying to get pregnant  breast-feeding How should I use this medicine? This medicine is for injection under the skin. It is administered in a hospital or clinic by a specially trained health care professional. Talk to your pediatrician regarding the use of this medicine in children. While this drug may be prescribed for selected conditions, precautions do apply. Overdosage: If you think you have taken too much of this medicine contact a poison control center or emergency room at once. NOTE: This medicine is only for you. Do not share this medicine with others. What if I miss a dose? It is important not to miss your dose. Call your doctor or health care professional if you are unable to keep an appointment. What may interact with this medicine? Interactions have not been studied. Give your health care provider a list of all the medicines, herbs, non-prescription drugs, or dietary supplements you use. Also tell them if you smoke, drink alcohol, or use illegal drugs. Some items may interact with your medicine. This list may not describe all possible interactions. Give your health care provider a list of all the medicines, herbs, non-prescription drugs, or dietary supplements  you use. Also tell them if you smoke, drink alcohol, or use illegal drugs. Some items may interact with your medicine. What should I watch for while using this medicine? Visit your doctor for checks on your progress. This drug may make you feel generally unwell. This is not uncommon, as chemotherapy can affect healthy cells as well as cancer cells. Report any side effects. Continue your course of treatment even though you feel ill unless your doctor tells you to stop. In some cases, you may be given additional medicines to help with side effects. Follow all directions for their use. Call your doctor or health care professional for advice if you get a fever, chills or sore throat, or other symptoms of a cold or flu. Do not treat yourself. This drug decreases your body's ability to fight infections. Try to avoid being around people who are sick. This medicine may increase your risk to bruise or bleed. Call your doctor or health care professional if you notice any unusual bleeding. You may need blood work done while you are taking this medicine. Do not become pregnant while taking this medicine and for 6 months after the last dose. Women should inform their doctor if they wish to become pregnant or think they might be pregnant. Men should not father a child while taking this medicine and for 3 months after the last dose. There is a potential for serious side effects to an unborn child. Talk to your health care professional or pharmacist for more information. Do not breast-feed an infant while taking this medicine and for 1 week after the last dose. This medicine may interfere with   the ability to have a child. Talk with your doctor or health care professional if you are concerned about your fertility. What side effects may I notice from receiving this medicine? Side effects that you should report to your doctor or health care professional as soon as possible:  allergic reactions like skin rash, itching or  hives, swelling of the face, lips, or tongue  low blood counts - this medicine may decrease the number of white blood cells, red blood cells and platelets. You may be at increased risk for infections and bleeding.  signs of infection - fever or chills, cough, sore throat, pain passing urine  signs of decreased platelets or bleeding - bruising, pinpoint red spots on the skin, black, tarry stools, blood in the urine  signs of decreased red blood cells - unusually weak or tired, fainting spells, lightheadedness  signs and symptoms of kidney injury like trouble passing urine or change in the amount of urine  signs and symptoms of liver injury like dark yellow or brown urine; general ill feeling or flu-like symptoms; light-colored stools; loss of appetite; nausea; right upper belly pain; unusually weak or tired; yellowing of the eyes or skin Side effects that usually do not require medical attention (report to your doctor or health care professional if they continue or are bothersome):  constipation  diarrhea  nausea, vomiting  pain or redness at the injection site  unusually weak or tired This list may not describe all possible side effects. Call your doctor for medical advice about side effects. You may report side effects to FDA at 1-800-FDA-1088. Where should I keep my medicine? This drug is given in a hospital or clinic and will not be stored at home. NOTE: This sheet is a summary. It may not cover all possible information. If you have questions about this medicine, talk to your doctor, pharmacist, or health care provider.  2021 Elsevier/Gold Standard (2016-12-27 14:37:51)  

## 2021-03-08 ENCOUNTER — Ambulatory Visit: Payer: PPO

## 2021-03-08 ENCOUNTER — Other Ambulatory Visit: Payer: Self-pay

## 2021-03-08 ENCOUNTER — Inpatient Hospital Stay: Payer: PPO

## 2021-03-08 VITALS — BP 129/69 | HR 80 | Temp 99.0°F | Resp 16

## 2021-03-08 DIAGNOSIS — Z5111 Encounter for antineoplastic chemotherapy: Secondary | ICD-10-CM | POA: Diagnosis not present

## 2021-03-08 DIAGNOSIS — D46Z Other myelodysplastic syndromes: Secondary | ICD-10-CM

## 2021-03-08 LAB — CBC WITH DIFFERENTIAL (CANCER CENTER ONLY)
Abs Immature Granulocytes: 0.27 10*3/uL — ABNORMAL HIGH (ref 0.00–0.07)
Basophils Absolute: 0 10*3/uL (ref 0.0–0.1)
Basophils Relative: 0 %
Eosinophils Absolute: 0 10*3/uL (ref 0.0–0.5)
Eosinophils Relative: 0 %
HCT: 24.2 % — ABNORMAL LOW (ref 39.0–52.0)
Hemoglobin: 8.2 g/dL — ABNORMAL LOW (ref 13.0–17.0)
Immature Granulocytes: 17 %
Lymphocytes Relative: 31 %
Lymphs Abs: 0.5 10*3/uL — ABNORMAL LOW (ref 0.7–4.0)
MCH: 31.1 pg (ref 26.0–34.0)
MCHC: 33.9 g/dL (ref 30.0–36.0)
MCV: 91.7 fL (ref 80.0–100.0)
Monocytes Absolute: 0.3 10*3/uL (ref 0.1–1.0)
Monocytes Relative: 16 %
Neutro Abs: 0.6 10*3/uL — ABNORMAL LOW (ref 1.7–7.7)
Neutrophils Relative %: 36 %
Platelet Count: 29 10*3/uL — ABNORMAL LOW (ref 150–400)
RBC: 2.64 MIL/uL — ABNORMAL LOW (ref 4.22–5.81)
RDW: 17.3 % — ABNORMAL HIGH (ref 11.5–15.5)
WBC Count: 1.6 10*3/uL — ABNORMAL LOW (ref 4.0–10.5)
nRBC: 5.6 % — ABNORMAL HIGH (ref 0.0–0.2)

## 2021-03-08 LAB — BASIC METABOLIC PANEL - CANCER CENTER ONLY
Anion gap: 8 (ref 5–15)
BUN: 24 mg/dL — ABNORMAL HIGH (ref 8–23)
CO2: 23 mmol/L (ref 22–32)
Calcium: 9.3 mg/dL (ref 8.9–10.3)
Chloride: 95 mmol/L — ABNORMAL LOW (ref 98–111)
Creatinine: 1.21 mg/dL (ref 0.61–1.24)
GFR, Estimated: >60 mL/min (ref >60)
Glucose, Bld: 162 mg/dL — ABNORMAL HIGH (ref 70–99)
Potassium: 3.9 mmol/L (ref 3.5–5.1)
Sodium: 126 mmol/L — ABNORMAL LOW (ref 135–145)

## 2021-03-08 MED ORDER — PALONOSETRON HCL INJECTION 0.25 MG/5ML
INTRAVENOUS | Status: AC
  Filled 2021-03-08: qty 5

## 2021-03-08 MED ORDER — PALONOSETRON HCL INJECTION 0.25 MG/5ML
0.2500 mg | Freq: Once | INTRAVENOUS | Status: AC
  Administered 2021-03-08: 0.25 mg via INTRAVENOUS

## 2021-03-08 MED ORDER — SODIUM CHLORIDE 0.9% FLUSH
10.0000 mL | INTRAVENOUS | Status: DC | PRN
  Administered 2021-03-08: 10 mL
  Filled 2021-03-08: qty 10

## 2021-03-08 MED ORDER — HEPARIN SOD (PORK) LOCK FLUSH 100 UNIT/ML IV SOLN
500.0000 [IU] | Freq: Once | INTRAVENOUS | Status: AC | PRN
  Administered 2021-03-08: 500 [IU]
  Filled 2021-03-08: qty 5

## 2021-03-08 MED ORDER — SODIUM CHLORIDE 0.9 % IV SOLN
10.0000 mg | Freq: Once | INTRAVENOUS | Status: AC
  Administered 2021-03-08: 10 mg via INTRAVENOUS
  Filled 2021-03-08: qty 10

## 2021-03-08 MED ORDER — SODIUM CHLORIDE 0.9 % IV SOLN
75.0000 mg/m2 | Freq: Once | INTRAVENOUS | Status: AC
  Administered 2021-03-08: 165 mg via INTRAVENOUS
  Filled 2021-03-08: qty 16.5

## 2021-03-08 MED ORDER — SODIUM CHLORIDE 0.9 % IV SOLN
Freq: Once | INTRAVENOUS | Status: AC
  Filled 2021-03-08: qty 250

## 2021-03-08 NOTE — Progress Notes (Signed)
Ok to treat with abnormal labs from Thursday 03/04/2021 per Dr Marin Olp. dph

## 2021-03-08 NOTE — Patient Instructions (Addendum)
[facility] Discharge Instructions for Patients Receiving Chemotherapy  Today you received the following chemotherapy agents Vidaza  To help prevent nausea and vomiting after your treatment, we encourage you to take your nausea medication as prescribed by MD   If you develop nausea and vomiting that is not controlled by your nausea medication, call the clinic.   BELOW ARE SYMPTOMS THAT SHOULD BE REPORTED IMMEDIATELY:  *FEVER GREATER THAN 100.5 F  *CHILLS WITH OR WITHOUT FEVER  NAUSEA AND VOMITING THAT IS NOT CONTROLLED WITH YOUR NAUSEA MEDICATION  *UNUSUAL SHORTNESS OF BREATH  *UNUSUAL BRUISING OR BLEEDING  TENDERNESS IN MOUTH AND THROAT WITH OR WITHOUT PRESENCE OF ULCERS  *URINARY PROBLEMS  *BOWEL PROBLEMS  UNUSUAL RASH Items with * indicate a potential emergency and should be followed up as soon as possible.  Feel free to call the clinic should you have any questions or concerns. The clinic phone number is [phone].  Please show the [facility] at check-in to the Emergency Department and triage nurse.

## 2021-03-09 ENCOUNTER — Inpatient Hospital Stay: Payer: PPO

## 2021-03-09 ENCOUNTER — Other Ambulatory Visit: Payer: Self-pay | Admitting: Hematology & Oncology

## 2021-03-09 VITALS — BP 129/65 | HR 79 | Temp 97.8°F | Resp 17

## 2021-03-09 DIAGNOSIS — Z5111 Encounter for antineoplastic chemotherapy: Secondary | ICD-10-CM | POA: Diagnosis not present

## 2021-03-09 DIAGNOSIS — D46Z Other myelodysplastic syndromes: Secondary | ICD-10-CM

## 2021-03-09 MED ORDER — HEPARIN SOD (PORK) LOCK FLUSH 100 UNIT/ML IV SOLN
500.0000 [IU] | Freq: Once | INTRAVENOUS | Status: AC | PRN
  Administered 2021-03-09: 500 [IU]
  Filled 2021-03-09: qty 5

## 2021-03-09 MED ORDER — SODIUM CHLORIDE 0.9% FLUSH
10.0000 mL | INTRAVENOUS | Status: DC | PRN
  Administered 2021-03-09: 10 mL
  Filled 2021-03-09: qty 10

## 2021-03-09 MED ORDER — SODIUM CHLORIDE 0.9 % IV SOLN
Freq: Once | INTRAVENOUS | Status: AC
  Filled 2021-03-09: qty 250

## 2021-03-09 MED ORDER — SODIUM CHLORIDE 0.9 % IV SOLN
10.0000 mg | Freq: Once | INTRAVENOUS | Status: AC
  Administered 2021-03-09: 10 mg via INTRAVENOUS
  Filled 2021-03-09: qty 10

## 2021-03-09 MED ORDER — SODIUM CHLORIDE 0.9 % IV SOLN
75.0000 mg/m2 | Freq: Once | INTRAVENOUS | Status: AC
  Administered 2021-03-09: 165 mg via INTRAVENOUS
  Filled 2021-03-09: qty 16.5

## 2021-03-09 NOTE — Patient Instructions (Signed)
[facility] Jenkins Discharge Instructions for Patients Receiving Chemotherapy  Today you received the following chemotherapy agents Vidaza  To help prevent nausea and vomiting after your treatment, we encourage you to take your nausea medication as prescribed by MD   If you develop nausea and vomiting that is not controlled by your nausea medication, call the clinic.   BELOW ARE SYMPTOMS THAT SHOULD BE REPORTED IMMEDIATELY:  *FEVER GREATER THAN 100.5 F  *CHILLS WITH OR WITHOUT FEVER  NAUSEA AND VOMITING THAT IS NOT CONTROLLED WITH YOUR NAUSEA MEDICATION  *UNUSUAL SHORTNESS OF BREATH  *UNUSUAL BRUISING OR BLEEDING  TENDERNESS IN MOUTH AND THROAT WITH OR WITHOUT PRESENCE OF ULCERS  *URINARY PROBLEMS  *BOWEL PROBLEMS  UNUSUAL RASH Items with * indicate a potential emergency and should be followed up as soon as possible.  Feel free to call the clinic should you have any questions or concerns. The clinic phone number is [phone].  Please show the [facility] at check-in to the Emergency Department and triage nurse.

## 2021-03-10 ENCOUNTER — Inpatient Hospital Stay: Payer: PPO

## 2021-03-10 ENCOUNTER — Other Ambulatory Visit: Payer: Self-pay

## 2021-03-10 VITALS — BP 130/67 | HR 73 | Temp 98.1°F | Resp 17

## 2021-03-10 DIAGNOSIS — D46Z Other myelodysplastic syndromes: Secondary | ICD-10-CM

## 2021-03-10 DIAGNOSIS — Z5111 Encounter for antineoplastic chemotherapy: Secondary | ICD-10-CM | POA: Diagnosis not present

## 2021-03-10 MED ORDER — SODIUM CHLORIDE 0.9% FLUSH
10.0000 mL | INTRAVENOUS | Status: DC | PRN
  Administered 2021-03-10: 10 mL
  Filled 2021-03-10: qty 10

## 2021-03-10 MED ORDER — SODIUM CHLORIDE 0.9 % IV SOLN
10.0000 mg | Freq: Once | INTRAVENOUS | Status: AC
  Administered 2021-03-10: 10 mg via INTRAVENOUS
  Filled 2021-03-10: qty 10

## 2021-03-10 MED ORDER — SODIUM CHLORIDE 0.9 % IV SOLN
Freq: Once | INTRAVENOUS | Status: AC
  Filled 2021-03-10: qty 250

## 2021-03-10 MED ORDER — PALONOSETRON HCL INJECTION 0.25 MG/5ML
0.2500 mg | Freq: Once | INTRAVENOUS | Status: AC
  Administered 2021-03-10: 0.25 mg via INTRAVENOUS

## 2021-03-10 MED ORDER — SODIUM CHLORIDE 0.9 % IV SOLN
75.0000 mg/m2 | Freq: Once | INTRAVENOUS | Status: AC
  Administered 2021-03-10: 165 mg via INTRAVENOUS
  Filled 2021-03-10: qty 16.5

## 2021-03-10 MED ORDER — HEPARIN SOD (PORK) LOCK FLUSH 100 UNIT/ML IV SOLN
500.0000 [IU] | Freq: Once | INTRAVENOUS | Status: AC | PRN
  Administered 2021-03-10: 500 [IU]
  Filled 2021-03-10: qty 5

## 2021-03-10 MED ORDER — PALONOSETRON HCL INJECTION 0.25 MG/5ML
INTRAVENOUS | Status: AC
  Filled 2021-03-10: qty 5

## 2021-03-10 NOTE — Patient Instructions (Signed)
Azacitidine suspension for injection (subcutaneous use) What is this medicine? AZACITIDINE (ay za SITE i deen) is a chemotherapy drug. This medicine reduces the growth of cancer cells and can suppress the immune system. It is used for treating myelodysplastic syndrome or some types of leukemia. This medicine may be used for other purposes; ask your health care provider or pharmacist if you have questions. COMMON BRAND NAME(S): Vidaza What should I tell my health care provider before I take this medicine? They need to know if you have any of these conditions:  kidney disease  liver disease  liver tumors  an unusual or allergic reaction to azacitidine, mannitol, other medicines, foods, dyes, or preservatives  pregnant or trying to get pregnant  breast-feeding How should I use this medicine? This medicine is for injection under the skin. It is administered in a hospital or clinic by a specially trained health care professional. Talk to your pediatrician regarding the use of this medicine in children. While this drug may be prescribed for selected conditions, precautions do apply. Overdosage: If you think you have taken too much of this medicine contact a poison control center or emergency room at once. NOTE: This medicine is only for you. Do not share this medicine with others. What if I miss a dose? It is important not to miss your dose. Call your doctor or health care professional if you are unable to keep an appointment. What may interact with this medicine? Interactions have not been studied. Give your health care provider a list of all the medicines, herbs, non-prescription drugs, or dietary supplements you use. Also tell them if you smoke, drink alcohol, or use illegal drugs. Some items may interact with your medicine. This list may not describe all possible interactions. Give your health care provider a list of all the medicines, herbs, non-prescription drugs, or dietary supplements  you use. Also tell them if you smoke, drink alcohol, or use illegal drugs. Some items may interact with your medicine. What should I watch for while using this medicine? Visit your doctor for checks on your progress. This drug may make you feel generally unwell. This is not uncommon, as chemotherapy can affect healthy cells as well as cancer cells. Report any side effects. Continue your course of treatment even though you feel ill unless your doctor tells you to stop. In some cases, you may be given additional medicines to help with side effects. Follow all directions for their use. Call your doctor or health care professional for advice if you get a fever, chills or sore throat, or other symptoms of a cold or flu. Do not treat yourself. This drug decreases your body's ability to fight infections. Try to avoid being around people who are sick. This medicine may increase your risk to bruise or bleed. Call your doctor or health care professional if you notice any unusual bleeding. You may need blood work done while you are taking this medicine. Do not become pregnant while taking this medicine and for 6 months after the last dose. Women should inform their doctor if they wish to become pregnant or think they might be pregnant. Men should not father a child while taking this medicine and for 3 months after the last dose. There is a potential for serious side effects to an unborn child. Talk to your health care professional or pharmacist for more information. Do not breast-feed an infant while taking this medicine and for 1 week after the last dose. This medicine may interfere with   the ability to have a child. Talk with your doctor or health care professional if you are concerned about your fertility. What side effects may I notice from receiving this medicine? Side effects that you should report to your doctor or health care professional as soon as possible:  allergic reactions like skin rash, itching or  hives, swelling of the face, lips, or tongue  low blood counts - this medicine may decrease the number of white blood cells, red blood cells and platelets. You may be at increased risk for infections and bleeding.  signs of infection - fever or chills, cough, sore throat, pain passing urine  signs of decreased platelets or bleeding - bruising, pinpoint red spots on the skin, black, tarry stools, blood in the urine  signs of decreased red blood cells - unusually weak or tired, fainting spells, lightheadedness  signs and symptoms of kidney injury like trouble passing urine or change in the amount of urine  signs and symptoms of liver injury like dark yellow or brown urine; general ill feeling or flu-like symptoms; light-colored stools; loss of appetite; nausea; right upper belly pain; unusually weak or tired; yellowing of the eyes or skin Side effects that usually do not require medical attention (report to your doctor or health care professional if they continue or are bothersome):  constipation  diarrhea  nausea, vomiting  pain or redness at the injection site  unusually weak or tired This list may not describe all possible side effects. Call your doctor for medical advice about side effects. You may report side effects to FDA at 1-800-FDA-1088. Where should I keep my medicine? This drug is given in a hospital or clinic and will not be stored at home. NOTE: This sheet is a summary. It may not cover all possible information. If you have questions about this medicine, talk to your doctor, pharmacist, or health care provider.  2021 Elsevier/Gold Standard (2016-12-27 14:37:51)  

## 2021-03-15 ENCOUNTER — Telehealth: Payer: Self-pay | Admitting: *Deleted

## 2021-03-15 ENCOUNTER — Inpatient Hospital Stay: Payer: PPO

## 2021-03-15 ENCOUNTER — Inpatient Hospital Stay: Payer: PPO | Attending: Hematology & Oncology

## 2021-03-15 ENCOUNTER — Other Ambulatory Visit: Payer: Self-pay

## 2021-03-15 ENCOUNTER — Other Ambulatory Visit: Payer: Self-pay | Admitting: *Deleted

## 2021-03-15 DIAGNOSIS — D4622 Refractory anemia with excess of blasts 2: Secondary | ICD-10-CM | POA: Diagnosis not present

## 2021-03-15 DIAGNOSIS — C9200 Acute myeloblastic leukemia, not having achieved remission: Secondary | ICD-10-CM | POA: Diagnosis not present

## 2021-03-15 DIAGNOSIS — R21 Rash and other nonspecific skin eruption: Secondary | ICD-10-CM | POA: Insufficient documentation

## 2021-03-15 DIAGNOSIS — D696 Thrombocytopenia, unspecified: Secondary | ICD-10-CM

## 2021-03-15 DIAGNOSIS — Z79899 Other long term (current) drug therapy: Secondary | ICD-10-CM | POA: Diagnosis not present

## 2021-03-15 DIAGNOSIS — D46Z Other myelodysplastic syndromes: Secondary | ICD-10-CM

## 2021-03-15 DIAGNOSIS — Z5111 Encounter for antineoplastic chemotherapy: Secondary | ICD-10-CM | POA: Insufficient documentation

## 2021-03-15 LAB — CBC WITH DIFFERENTIAL (CANCER CENTER ONLY)
Abs Immature Granulocytes: 0.29 10*3/uL — ABNORMAL HIGH (ref 0.00–0.07)
Basophils Absolute: 0 10*3/uL (ref 0.0–0.1)
Basophils Relative: 0 %
Eosinophils Absolute: 0 10*3/uL (ref 0.0–0.5)
Eosinophils Relative: 0 %
HCT: 21.6 % — ABNORMAL LOW (ref 39.0–52.0)
Hemoglobin: 7.4 g/dL — ABNORMAL LOW (ref 13.0–17.0)
Immature Granulocytes: 14 %
Lymphocytes Relative: 27 %
Lymphs Abs: 0.5 10*3/uL — ABNORMAL LOW (ref 0.7–4.0)
MCH: 31.1 pg (ref 26.0–34.0)
MCHC: 34.3 g/dL (ref 30.0–36.0)
MCV: 90.8 fL (ref 80.0–100.0)
Monocytes Absolute: 0.1 10*3/uL (ref 0.1–1.0)
Monocytes Relative: 4 %
Neutro Abs: 1.1 10*3/uL — ABNORMAL LOW (ref 1.7–7.7)
Neutrophils Relative %: 55 %
Platelet Count: 12 10*3/uL — ABNORMAL LOW (ref 150–400)
RBC: 2.38 MIL/uL — ABNORMAL LOW (ref 4.22–5.81)
RDW: 17.2 % — ABNORMAL HIGH (ref 11.5–15.5)
WBC Count: 2 10*3/uL — ABNORMAL LOW (ref 4.0–10.5)
nRBC: 2.9 % — ABNORMAL HIGH (ref 0.0–0.2)

## 2021-03-15 LAB — BASIC METABOLIC PANEL - CANCER CENTER ONLY
Anion gap: 7 (ref 5–15)
BUN: 28 mg/dL — ABNORMAL HIGH (ref 8–23)
CO2: 22 mmol/L (ref 22–32)
Calcium: 9.2 mg/dL (ref 8.9–10.3)
Chloride: 95 mmol/L — ABNORMAL LOW (ref 98–111)
Creatinine: 1.25 mg/dL — ABNORMAL HIGH (ref 0.61–1.24)
GFR, Estimated: >60 mL/min (ref >60)
Glucose, Bld: 129 mg/dL — ABNORMAL HIGH (ref 70–99)
Potassium: 4.1 mmol/L (ref 3.5–5.1)
Sodium: 124 mmol/L — ABNORMAL LOW (ref 135–145)

## 2021-03-15 LAB — PREPARE RBC (CROSSMATCH)

## 2021-03-15 MED ORDER — HEPARIN SOD (PORK) LOCK FLUSH 100 UNIT/ML IV SOLN
500.0000 [IU] | Freq: Every day | INTRAVENOUS | Status: AC | PRN
  Administered 2021-03-15: 500 [IU]
  Filled 2021-03-15: qty 5

## 2021-03-15 MED ORDER — SODIUM CHLORIDE 0.9% IV SOLUTION
250.0000 mL | Freq: Once | INTRAVENOUS | Status: DC
  Filled 2021-03-15: qty 250

## 2021-03-15 MED ORDER — SODIUM CHLORIDE 0.9% FLUSH
10.0000 mL | Freq: Once | INTRAVENOUS | Status: AC
  Administered 2021-03-15: 10 mL via INTRAVENOUS
  Filled 2021-03-15: qty 10

## 2021-03-15 MED ORDER — HEPARIN SOD (PORK) LOCK FLUSH 100 UNIT/ML IV SOLN
500.0000 [IU] | Freq: Every day | INTRAVENOUS | Status: DC | PRN
  Filled 2021-03-15: qty 5

## 2021-03-15 NOTE — Patient Instructions (Signed)

## 2021-03-15 NOTE — Telephone Encounter (Signed)
Platelets 12,000.  Patient experiencing bruising from a fall this weekend.  Hgb 7.2.  Dr Marin Olp ordered 1 u platelets and 1 u blood on patient

## 2021-03-15 NOTE — Patient Instructions (Signed)

## 2021-03-16 ENCOUNTER — Inpatient Hospital Stay: Payer: PPO

## 2021-03-16 DIAGNOSIS — D696 Thrombocytopenia, unspecified: Secondary | ICD-10-CM

## 2021-03-16 DIAGNOSIS — Z5111 Encounter for antineoplastic chemotherapy: Secondary | ICD-10-CM | POA: Diagnosis not present

## 2021-03-16 LAB — BPAM PLATELET PHERESIS
Blood Product Expiration Date: 202204062359
ISSUE DATE / TIME: 202204041216
Unit Type and Rh: 6200

## 2021-03-16 LAB — PREPARE PLATELET PHERESIS: Unit division: 0

## 2021-03-16 MED ORDER — SODIUM CHLORIDE 0.9% IV SOLUTION
250.0000 mL | Freq: Once | INTRAVENOUS | Status: AC
  Administered 2021-03-16: 250 mL via INTRAVENOUS
  Filled 2021-03-16: qty 250

## 2021-03-16 NOTE — Patient Instructions (Signed)

## 2021-03-17 LAB — BPAM RBC
Blood Product Expiration Date: 202204252359
ISSUE DATE / TIME: 202204050745
Unit Type and Rh: 6200

## 2021-03-17 LAB — TYPE AND SCREEN
ABO/RH(D): A POS
Antibody Screen: NEGATIVE
Unit division: 0

## 2021-03-18 DIAGNOSIS — G4733 Obstructive sleep apnea (adult) (pediatric): Secondary | ICD-10-CM | POA: Diagnosis not present

## 2021-03-22 ENCOUNTER — Inpatient Hospital Stay: Payer: PPO

## 2021-03-22 ENCOUNTER — Other Ambulatory Visit: Payer: Self-pay | Admitting: Family

## 2021-03-22 ENCOUNTER — Other Ambulatory Visit: Payer: Self-pay | Admitting: *Deleted

## 2021-03-22 ENCOUNTER — Other Ambulatory Visit: Payer: Self-pay

## 2021-03-22 ENCOUNTER — Telehealth: Payer: Self-pay | Admitting: *Deleted

## 2021-03-22 DIAGNOSIS — D696 Thrombocytopenia, unspecified: Secondary | ICD-10-CM

## 2021-03-22 DIAGNOSIS — Z5111 Encounter for antineoplastic chemotherapy: Secondary | ICD-10-CM | POA: Diagnosis not present

## 2021-03-22 DIAGNOSIS — D46Z Other myelodysplastic syndromes: Secondary | ICD-10-CM

## 2021-03-22 DIAGNOSIS — D649 Anemia, unspecified: Secondary | ICD-10-CM

## 2021-03-22 LAB — BASIC METABOLIC PANEL - CANCER CENTER ONLY
Anion gap: 9 (ref 5–15)
BUN: 20 mg/dL (ref 8–23)
CO2: 21 mmol/L — ABNORMAL LOW (ref 22–32)
Calcium: 9.3 mg/dL (ref 8.9–10.3)
Chloride: 99 mmol/L (ref 98–111)
Creatinine: 1.34 mg/dL — ABNORMAL HIGH (ref 0.61–1.24)
GFR, Estimated: 56 mL/min — ABNORMAL LOW (ref >60)
Glucose, Bld: 145 mg/dL — ABNORMAL HIGH (ref 70–99)
Potassium: 4.2 mmol/L (ref 3.5–5.1)
Sodium: 129 mmol/L — ABNORMAL LOW (ref 135–145)

## 2021-03-22 LAB — CBC WITH DIFFERENTIAL (CANCER CENTER ONLY)
Abs Immature Granulocytes: 0.18 10*3/uL — ABNORMAL HIGH (ref 0.00–0.07)
Basophils Absolute: 0 10*3/uL (ref 0.0–0.1)
Basophils Relative: 0 %
Eosinophils Absolute: 0 10*3/uL (ref 0.0–0.5)
Eosinophils Relative: 0 %
HCT: 21 % — ABNORMAL LOW (ref 39.0–52.0)
Hemoglobin: 7 g/dL — ABNORMAL LOW (ref 13.0–17.0)
Immature Granulocytes: 20 %
Lymphocytes Relative: 33 %
Lymphs Abs: 0.3 10*3/uL — ABNORMAL LOW (ref 0.7–4.0)
MCH: 31.3 pg (ref 26.0–34.0)
MCHC: 33.3 g/dL (ref 30.0–36.0)
MCV: 93.8 fL (ref 80.0–100.0)
Monocytes Absolute: 0.1 10*3/uL (ref 0.1–1.0)
Monocytes Relative: 11 %
Neutro Abs: 0.3 10*3/uL — CL (ref 1.7–7.7)
Neutrophils Relative %: 36 %
Platelet Count: 10 10*3/uL — ABNORMAL LOW (ref 150–400)
RBC: 2.24 MIL/uL — ABNORMAL LOW (ref 4.22–5.81)
RDW: 17.3 % — ABNORMAL HIGH (ref 11.5–15.5)
WBC Count: 0.9 10*3/uL — CL (ref 4.0–10.5)
nRBC: 3.3 % — ABNORMAL HIGH (ref 0.0–0.2)

## 2021-03-22 LAB — PREPARE RBC (CROSSMATCH)

## 2021-03-22 MED ORDER — HEPARIN SOD (PORK) LOCK FLUSH 100 UNIT/ML IV SOLN
500.0000 [IU] | Freq: Every day | INTRAVENOUS | Status: DC | PRN
Start: 2021-03-22 — End: 2021-03-22
  Filled 2021-03-22: qty 5

## 2021-03-22 MED ORDER — SODIUM CHLORIDE 0.9% IV SOLUTION
250.0000 mL | Freq: Once | INTRAVENOUS | Status: AC
Start: 2021-03-22 — End: 2021-03-22
  Administered 2021-03-22: 250 mL via INTRAVENOUS
  Filled 2021-03-22: qty 250

## 2021-03-22 MED ORDER — SODIUM CHLORIDE 0.9% FLUSH
10.0000 mL | INTRAVENOUS | Status: DC | PRN
Start: 2021-03-22 — End: 2021-03-22
  Filled 2021-03-22: qty 10

## 2021-03-22 NOTE — Patient Instructions (Signed)

## 2021-03-22 NOTE — Telephone Encounter (Addendum)
Dr. Marin Olp notified of Hgb-7.0, WBC-0.9 and platelet count-10.  Orders received for pt to get two units of blood and one unit of platelets per Dr. Marin Olp.  Dr. Marin Olp notified of ANC-0.3.  No new orders received at this time.

## 2021-03-22 NOTE — Patient Instructions (Signed)

## 2021-03-23 ENCOUNTER — Other Ambulatory Visit: Payer: Self-pay

## 2021-03-23 ENCOUNTER — Inpatient Hospital Stay: Payer: PPO

## 2021-03-23 DIAGNOSIS — D696 Thrombocytopenia, unspecified: Secondary | ICD-10-CM

## 2021-03-23 DIAGNOSIS — Z5111 Encounter for antineoplastic chemotherapy: Secondary | ICD-10-CM | POA: Diagnosis not present

## 2021-03-23 DIAGNOSIS — D649 Anemia, unspecified: Secondary | ICD-10-CM

## 2021-03-23 DIAGNOSIS — D46Z Other myelodysplastic syndromes: Secondary | ICD-10-CM

## 2021-03-23 LAB — PREPARE PLATELET PHERESIS: Unit division: 0

## 2021-03-23 LAB — BPAM PLATELET PHERESIS
Blood Product Expiration Date: 202204112359
ISSUE DATE / TIME: 202204111137
Unit Type and Rh: 5100

## 2021-03-23 MED ORDER — SODIUM CHLORIDE 0.9% IV SOLUTION
250.0000 mL | Freq: Once | INTRAVENOUS | Status: AC
  Administered 2021-03-23: 250 mL via INTRAVENOUS
  Filled 2021-03-23: qty 250

## 2021-03-23 MED ORDER — HEPARIN SOD (PORK) LOCK FLUSH 100 UNIT/ML IV SOLN
250.0000 [IU] | INTRAVENOUS | Status: AC | PRN
  Administered 2021-03-23: 500 [IU]
  Filled 2021-03-23: qty 5

## 2021-03-23 MED ORDER — SODIUM CHLORIDE 0.9% FLUSH
10.0000 mL | INTRAVENOUS | Status: AC | PRN
  Administered 2021-03-23: 10 mL
  Filled 2021-03-23: qty 10

## 2021-03-23 NOTE — Patient Instructions (Signed)

## 2021-03-24 LAB — TYPE AND SCREEN
ABO/RH(D): A POS
Antibody Screen: NEGATIVE
Unit division: 0
Unit division: 0

## 2021-03-24 LAB — BPAM RBC
Blood Product Expiration Date: 202204272359
Blood Product Expiration Date: 202204292359
ISSUE DATE / TIME: 202204120750
ISSUE DATE / TIME: 202204120750
Unit Type and Rh: 6200
Unit Type and Rh: 6200

## 2021-03-29 ENCOUNTER — Inpatient Hospital Stay: Payer: PPO

## 2021-03-29 ENCOUNTER — Other Ambulatory Visit: Payer: Self-pay | Admitting: Family

## 2021-03-29 ENCOUNTER — Other Ambulatory Visit: Payer: Self-pay

## 2021-03-29 ENCOUNTER — Telehealth: Payer: Self-pay | Admitting: *Deleted

## 2021-03-29 ENCOUNTER — Other Ambulatory Visit: Payer: Self-pay | Admitting: *Deleted

## 2021-03-29 ENCOUNTER — Encounter: Payer: Self-pay | Admitting: Hematology & Oncology

## 2021-03-29 ENCOUNTER — Inpatient Hospital Stay (HOSPITAL_BASED_OUTPATIENT_CLINIC_OR_DEPARTMENT_OTHER): Payer: PPO | Admitting: Hematology & Oncology

## 2021-03-29 VITALS — Wt 216.0 lb

## 2021-03-29 DIAGNOSIS — D46Z Other myelodysplastic syndromes: Secondary | ICD-10-CM

## 2021-03-29 DIAGNOSIS — D649 Anemia, unspecified: Secondary | ICD-10-CM

## 2021-03-29 DIAGNOSIS — Z5111 Encounter for antineoplastic chemotherapy: Secondary | ICD-10-CM | POA: Diagnosis not present

## 2021-03-29 LAB — CBC WITH DIFFERENTIAL (CANCER CENTER ONLY)
Abs Immature Granulocytes: 0.05 10*3/uL (ref 0.00–0.07)
Basophils Absolute: 0 10*3/uL (ref 0.0–0.1)
Basophils Relative: 0 %
Eosinophils Absolute: 0 10*3/uL (ref 0.0–0.5)
Eosinophils Relative: 0 %
HCT: 21.2 % — ABNORMAL LOW (ref 39.0–52.0)
Hemoglobin: 7 g/dL — ABNORMAL LOW (ref 13.0–17.0)
Immature Granulocytes: 8 %
Lymphocytes Relative: 59 %
Lymphs Abs: 0.4 10*3/uL — ABNORMAL LOW (ref 0.7–4.0)
MCH: 31.7 pg (ref 26.0–34.0)
MCHC: 33 g/dL (ref 30.0–36.0)
MCV: 95.9 fL (ref 80.0–100.0)
Monocytes Absolute: 0.1 10*3/uL (ref 0.1–1.0)
Monocytes Relative: 10 %
Neutro Abs: 0.1 10*3/uL — CL (ref 1.7–7.7)
Neutrophils Relative %: 23 %
Platelet Count: 17 10*3/uL — ABNORMAL LOW (ref 150–400)
RBC: 2.21 MIL/uL — ABNORMAL LOW (ref 4.22–5.81)
RDW: 17.2 % — ABNORMAL HIGH (ref 11.5–15.5)
WBC Count: 0.6 10*3/uL — CL (ref 4.0–10.5)
nRBC: 3.2 % — ABNORMAL HIGH (ref 0.0–0.2)

## 2021-03-29 LAB — CMP (CANCER CENTER ONLY)
ALT: 10 U/L (ref 0–44)
AST: 18 U/L (ref 15–41)
Albumin: 4 g/dL (ref 3.5–5.0)
Alkaline Phosphatase: 42 U/L (ref 38–126)
Anion gap: 9 (ref 5–15)
BUN: 31 mg/dL — ABNORMAL HIGH (ref 8–23)
CO2: 22 mmol/L (ref 22–32)
Calcium: 9.3 mg/dL (ref 8.9–10.3)
Chloride: 99 mmol/L (ref 98–111)
Creatinine: 1.53 mg/dL — ABNORMAL HIGH (ref 0.61–1.24)
GFR, Estimated: 48 mL/min — ABNORMAL LOW (ref >60)
Glucose, Bld: 188 mg/dL — ABNORMAL HIGH (ref 70–99)
Potassium: 4.3 mmol/L (ref 3.5–5.1)
Sodium: 130 mmol/L — ABNORMAL LOW (ref 135–145)
Total Bilirubin: 0.6 mg/dL (ref 0.3–1.2)
Total Protein: 6.8 g/dL (ref 6.5–8.1)

## 2021-03-29 LAB — IRON AND TIBC
Iron: 201 ug/dL — ABNORMAL HIGH (ref 42–163)
Saturation Ratios: 79 % — ABNORMAL HIGH (ref 20–55)
TIBC: 256 ug/dL (ref 202–409)
UIBC: 55 ug/dL — ABNORMAL LOW (ref 117–376)

## 2021-03-29 LAB — RETICULOCYTES
Immature Retic Fract: 29.4 % — ABNORMAL HIGH (ref 2.3–15.9)
RBC.: 2.22 MIL/uL — ABNORMAL LOW (ref 4.22–5.81)
Retic Count, Absolute: 66.8 10*3/uL (ref 19.0–186.0)
Retic Ct Pct: 3 % (ref 0.4–3.1)

## 2021-03-29 LAB — SAMPLE TO BLOOD BANK

## 2021-03-29 LAB — FERRITIN: Ferritin: 364 ng/mL — ABNORMAL HIGH (ref 24–336)

## 2021-03-29 LAB — SAVE SMEAR(SSMR), FOR PROVIDER SLIDE REVIEW

## 2021-03-29 LAB — PREPARE RBC (CROSSMATCH)

## 2021-03-29 LAB — LACTATE DEHYDROGENASE: LDH: 200 U/L — ABNORMAL HIGH (ref 98–192)

## 2021-03-29 NOTE — Patient Instructions (Signed)
Implanted Port Insertion, Care After This sheet gives you information about how to care for yourself after your procedure. Your health care provider may also give you more specific instructions. If you have problems or questions, contact your health care provider. What can I expect after the procedure? After the procedure, it is common to have:  Discomfort at the port insertion site.  Bruising on the skin over the port. This should improve over 3-4 days. Follow these instructions at home: Port care  After your port is placed, you will get a manufacturer's information card. The card has information about your port. Keep this card with you at all times.  Take care of the port as told by your health care provider. Ask your health care provider if you or a family member can get training for taking care of the port at home. A home health care nurse may also take care of the port.  Make sure to remember what type of port you have. Incision care  Follow instructions from your health care provider about how to take care of your port insertion site. Make sure you: ? Wash your hands with soap and water before and after you change your bandage (dressing). If soap and water are not available, use hand sanitizer. ? Change your dressing as told by your health care provider. ? Leave stitches (sutures), skin glue, or adhesive strips in place. These skin closures may need to stay in place for 2 weeks or longer. If adhesive strip edges start to loosen and curl up, you may trim the loose edges. Do not remove adhesive strips completely unless your health care provider tells you to do that.  Check your port insertion site every day for signs of infection. Check for: ? Redness, swelling, or pain. ? Fluid or blood. ? Warmth. ? Pus or a bad smell.      Activity  Return to your normal activities as told by your health care provider. Ask your health care provider what activities are safe for you.  Do not  lift anything that is heavier than 10 lb (4.5 kg), or the limit that you are told, until your health care provider says that it is safe. General instructions  Take over-the-counter and prescription medicines only as told by your health care provider.  Do not take baths, swim, or use a hot tub until your health care provider approves. Ask your health care provider if you may take showers. You may only be allowed to take sponge baths.  Do not drive for 24 hours if you were given a sedative during your procedure.  Wear a medical alert bracelet in case of an emergency. This will tell any health care providers that you have a port.  Keep all follow-up visits as told by your health care provider. This is important. Contact a health care provider if:  You cannot flush your port with saline as directed, or you cannot draw blood from the port.  You have a fever or chills.  You have redness, swelling, or pain around your port insertion site.  You have fluid or blood coming from your port insertion site.  Your port insertion site feels warm to the touch.  You have pus or a bad smell coming from the port insertion site. Get help right away if:  You have chest pain or shortness of breath.  You have bleeding from your port that you cannot control. Summary  Take care of the port as told by your   health care provider. Keep the manufacturer's information card with you at all times.  Change your dressing as told by your health care provider.  Contact a health care provider if you have a fever or chills or if you have redness, swelling, or pain around your port insertion site.  Keep all follow-up visits as told by your health care provider. This information is not intended to replace advice given to you by your health care provider. Make sure you discuss any questions you have with your health care provider. Document Revised: 06/26/2018 Document Reviewed: 06/26/2018 Elsevier Patient Education   2021 Elsevier Inc.  

## 2021-03-29 NOTE — Progress Notes (Signed)
Hematology and Oncology Follow Up Visit  GORDEN STTHOMAS [id number] Oct 20, 1946 75 y.o. 03/29/2021   Principle Diagnosis:   MDS -- RAEB-2  --  Normal cytogenetics;  IPSS = 5  Current Therapy:    Vidaza 75 mg/m2 IV q day d1-7  -- s/p cycle #2- start on 01/25/2021     Interim History:  Mr. Miler is in for follow-up.  He is doing okay.  Unfortunately, his labs still do not look all that good.  He still has marked leukopenia.  His platelet count is not all that bad.  He is quite anemic.  He got 2 units of blood a week ago.  He really has not responded to this.  Hopefully, he is not developing alloimmunization.  He has had no problems with fever.  He has had no cardiac issues.  He has had no cough or shortness of breath.  He does get tired quite easily.  He has had no issues with diarrhea.  Is been no rashes.  He has had some easy bruising.  Overall, I would have to say his performance status is ECOG 2.    Medications:  Current Outpatient Medications:  .  acetaminophen (TYLENOL) 500 MG tablet, Take 1,000 mg by mouth every 8 (eight) hours as needed for mild pain (or headaches). (Patient not taking: Reported on 03/02/2021), Disp: , Rfl:  .  amoxicillin (AMOXIL) 500 MG capsule, Take 2,000 mg by mouth See admin instructions. Take 2,000 mg by mouth one hour prior to dental appointments (Patient not taking: No sig reported), Disp: , Rfl:  .  ascorbic acid (VITAMIN C) 500 MG tablet, Take 500 mg by mouth daily with breakfast., Disp: , Rfl:  .  chlorhexidine (PERIDEX) 0.12 % solution, Use as directed 15 mLs in the mouth or throat 2 (two) times daily., Disp: 450 mL, Rfl: 3 .  Cyanocobalamin (VITAMIN B 12) 500 MCG TABS, Take 500 mcg by mouth daily., Disp: , Rfl:  .  dexamethasone (DECADRON) 4 MG tablet, Take 2 tablets (8 mg total) by mouth daily. Start the day after chemotherapy ends for 2 days. Take with food., Disp: 30 tablet, Rfl: 1 .  ferrous sulfate 325 (65 FE) MG tablet, Take 325 mg by mouth daily  with breakfast., Disp: , Rfl:  .  fluticasone (FLONASE) 50 MCG/ACT nasal spray, Place 2 sprays into both nostrils in the morning. (Patient not taking: No sig reported), Disp: , Rfl:  .  gabapentin (NEURONTIN) 300 MG capsule, Take 600 mg by mouth at bedtime. , Disp: , Rfl:  .  lidocaine-prilocaine (EMLA) cream, Apply to affected area once, Disp: 30 g, Rfl: 3 .  lisinopril (ZESTRIL) 5 MG tablet, TAKE 1 TABLET BY MOUTH DAILY (Patient taking differently: Take 5 mg by mouth in the morning.), Disp: 90 tablet, Rfl: 2 .  loratadine (CLARITIN) 10 MG tablet, Take 10 mg by mouth daily., Disp: , Rfl:  .  LORazepam (ATIVAN) 0.5 MG tablet, Take 1 tablet (0.5 mg total) by mouth every 6 (six) hours as needed (Nausea or vomiting). (Patient not taking: No sig reported), Disp: 30 tablet, Rfl: 0 .  metoprolol succinate (TOPROL-XL) 50 MG 24 hr tablet, Take 50 mg by mouth at bedtime., Disp: , Rfl:  .  Multiple Vitamins-Minerals (ONE-A-DAY 50 PLUS PO), Take 1 tablet by mouth daily with breakfast., Disp: , Rfl:  .  nitroGLYCERIN (NITROSTAT) 0.4 MG SL tablet, Place 1 tablet (0.4 mg total) under the tongue every 5 (five) minutes as needed for chest  pain., Disp: 25 tablet, Rfl: 6 .  omeprazole (PRILOSEC) 20 MG capsule, Take 20 mg by mouth daily., Disp: , Rfl:  .  ondansetron (ZOFRAN) 8 MG tablet, Take 1 tablet (8 mg total) by mouth 2 (two) times daily as needed for refractory nausea / vomiting. Start on the third day after last day of chemotherapy. (Patient not taking: No sig reported), Disp: 30 tablet, Rfl: 1 .  ONETOUCH VERIO test strip, 1 each by Other route daily., Disp: , Rfl: 3 .  polyethylene glycol (MIRALAX / GLYCOLAX) 17 g packet, Take 17 g by mouth daily. (Patient not taking: Reported on 03/02/2021), Disp: 14 each, Rfl: 0 .  PRILOSEC OTC 20 MG tablet, Take 20 mg by mouth daily at 6 PM., Disp: , Rfl:  .  prochlorperazine (COMPAZINE) 10 MG tablet, Take 1 tablet (10 mg total) by mouth every 6 (six) hours as needed (Nausea  or vomiting). (Patient not taking: No sig reported), Disp: 30 tablet, Rfl: 1 .  rosuvastatin (CRESTOR) 10 MG tablet, Take 10 mg by mouth at bedtime., Disp: , Rfl:  .  sitaGLIPtin-metformin (JANUMET) 50-1000 MG tablet, Take 1 tablet by mouth 2 (two) times daily with a meal. , Disp: , Rfl:   Allergies:  Allergies  Allergen Reactions  . Oxycodone Nausea And Vomiting    Past Medical History, Surgical history, Social history, and Family History were reviewed and updated.  Review of Systems: Review of Systems  Constitutional: Negative.   HENT:  Negative.   Eyes: Negative.   Respiratory: Negative.   Cardiovascular: Negative.   Gastrointestinal: Negative.   Endocrine: Negative.   Genitourinary: Negative.    Musculoskeletal: Negative.   Skin: Positive for rash.  Neurological: Negative.   Hematological: Bruises/bleeds easily.  Psychiatric/Behavioral: Negative.     Physical Exam:  weight is 216 lb (98 kg).   Wt Readings from Last 3 Encounters:  03/29/21 216 lb (98 kg)  03/02/21 214 lb (97.1 kg)  02/22/21 216 lb (98 kg)    Physical Exam Vitals reviewed.  HENT:     Head: Normocephalic and atraumatic.  Eyes:     Pupils: Pupils are equal, round, and reactive to light.  Cardiovascular:     Rate and Rhythm: Normal rate and regular rhythm.     Heart sounds: Normal heart sounds.  Pulmonary:     Effort: Pulmonary effort is normal.     Breath sounds: Normal breath sounds.  Abdominal:     General: Bowel sounds are normal.     Palpations: Abdomen is soft.  Musculoskeletal:        General: No tenderness or deformity. Normal range of motion.     Cervical back: Normal range of motion.  Lymphadenopathy:     Cervical: No cervical adenopathy.  Skin:    General: Skin is warm and dry.     Findings: No erythema or rash.     Comments: Skin exam does show some scattered ecchymoses.  I do not see any obvious petechia on his legs.  He has no rashes.  Neurological:     Mental Status: He is  alert and oriented to person, place, and time.  Psychiatric:        Behavior: Behavior normal.        Thought Content: Thought content normal.        Judgment: Judgment normal.      Lab Results  Component Value Date   WBC 0.6 (LL) 03/29/2021   HGB 7.0 (L) 03/29/2021   HCT  21.2 (L) 03/29/2021   MCV 95.9 03/29/2021   PLT 17 (L) 03/29/2021     Chemistry      Component Value Date/Time   NA 130 (L) 03/29/2021 0902   NA 131 (L) 12/09/2020 1629   K 4.3 03/29/2021 0902   CL 99 03/29/2021 0902   CO2 22 03/29/2021 0902   BUN 31 (H) 03/29/2021 0902   BUN 24 12/09/2020 1629   CREATININE 1.53 (H) 03/29/2021 0902      Component Value Date/Time   CALCIUM 9.3 03/29/2021 0902   ALKPHOS 42 03/29/2021 0902   AST 18 03/29/2021 0902   ALT 10 03/29/2021 0902   BILITOT 0.6 03/29/2021 0902      Impression and Plan: Mr. Therien is a very nice 75 year old white male.  He has high-grade myelodysplasia.  This clearly has been present for only about 7 months.  He had lab work done back in May 2021 which looked pretty normal.  He definitely has high-grade myelodysplasia.  He is trying to progress to acute myeloid leukemia.  I we will go ahead with a third cycle of Vidaza.  We will start this next week.  I just do not think we can add venetoclax yet.  I just think his blood counts are little bit too low.  We probably are going to have to get a bone marrow biopsy on him after his next cycle of treatment.  We will see about transfusing him tomorrow with 2 units of blood.  Hopefully, he will respond to this.  We will have to get him back in a week so we can see how he is doing.     Volanda Napoleon, MD 4/18/202210:40 AM

## 2021-03-29 NOTE — Telephone Encounter (Signed)
Dr. Marin Olp notified of WBC-0.6, HGB-7.0, ANC-0.1and platelet count-17.  No new orders received at this time.

## 2021-03-30 ENCOUNTER — Ambulatory Visit: Payer: PPO

## 2021-03-30 ENCOUNTER — Telehealth: Payer: Self-pay

## 2021-03-30 ENCOUNTER — Inpatient Hospital Stay: Payer: PPO

## 2021-03-30 DIAGNOSIS — D46Z Other myelodysplastic syndromes: Secondary | ICD-10-CM

## 2021-03-30 DIAGNOSIS — Z5111 Encounter for antineoplastic chemotherapy: Secondary | ICD-10-CM | POA: Diagnosis not present

## 2021-03-30 DIAGNOSIS — D649 Anemia, unspecified: Secondary | ICD-10-CM

## 2021-03-30 MED ORDER — DIPHENHYDRAMINE HCL 25 MG PO CAPS: 25.0000 mg | ORAL_CAPSULE | Freq: Once | ORAL | Status: DC

## 2021-03-30 MED ORDER — SODIUM CHLORIDE 0.9% FLUSH
10.0000 mL | INTRAVENOUS | Status: AC | PRN
  Administered 2021-03-30: 10 mL
  Filled 2021-03-30: qty 10

## 2021-03-30 MED ORDER — SODIUM CHLORIDE 0.9% IV SOLUTION
250.0000 mL | Freq: Once | INTRAVENOUS | Status: AC
  Administered 2021-03-30: 250 mL via INTRAVENOUS
  Filled 2021-03-30: qty 250

## 2021-03-30 MED ORDER — ACETAMINOPHEN 325 MG PO TABS: 650.0000 mg | ORAL_TABLET | Freq: Once | ORAL | Status: DC

## 2021-03-30 MED ORDER — HEPARIN SOD (PORK) LOCK FLUSH 100 UNIT/ML IV SOLN
500.0000 [IU] | Freq: Every day | INTRAVENOUS | Status: AC | PRN
  Administered 2021-03-30: 500 [IU]
  Filled 2021-03-30: qty 5

## 2021-03-30 NOTE — Patient Instructions (Signed)

## 2021-03-30 NOTE — Telephone Encounter (Signed)
appts made and printed for pt per 03/29/21 los   [employer]

## 2021-03-31 ENCOUNTER — Inpatient Hospital Stay: Payer: PPO

## 2021-03-31 DIAGNOSIS — D696 Thrombocytopenia, unspecified: Secondary | ICD-10-CM | POA: Diagnosis not present

## 2021-03-31 DIAGNOSIS — D649 Anemia, unspecified: Secondary | ICD-10-CM | POA: Diagnosis not present

## 2021-03-31 DIAGNOSIS — R040 Epistaxis: Secondary | ICD-10-CM | POA: Diagnosis not present

## 2021-03-31 LAB — TYPE AND SCREEN
ABO/RH(D): A POS
Antibody Screen: NEGATIVE
Unit division: 0
Unit division: 0

## 2021-03-31 LAB — BPAM RBC
Blood Product Expiration Date: 202205102359
Blood Product Expiration Date: 202205102359
ISSUE DATE / TIME: 202204190824
ISSUE DATE / TIME: 202204190824
Unit Type and Rh: 6200
Unit Type and Rh: 6200

## 2021-04-01 ENCOUNTER — Telehealth: Payer: Self-pay | Admitting: *Deleted

## 2021-04-01 ENCOUNTER — Telehealth: Payer: Self-pay

## 2021-04-01 ENCOUNTER — Ambulatory Visit: Payer: PPO

## 2021-04-01 NOTE — Telephone Encounter (Signed)
Pts wife called and stated that he went to [facility] ED for a really bad nose bleed yesterday and didn't know if we needed to see him etc.  Req that we look at his blood work and they have instructions to call the ENT Dr. Laurance Flatten for a f/u appt.  This message was sent to the desk nurse and dr Marin Olp to eval   Webb Silversmith

## 2021-04-01 NOTE — Telephone Encounter (Signed)
Called pt s/w wife, No antibiotics needed per Dr. Marin Olp. Mrs Jowers verbalized understanding. No further concerns

## 2021-04-01 NOTE — Telephone Encounter (Addendum)
Call placed to patient's wife to check on patient's status.  Pt.'s wife states that pt went to the ER at [facility] yesterday with a nose bleed and that nose was packed by the ER MD.  Pt was instructed to contact ENT and Dr. Marin Olp for possible antibiotics.  Informed pt.'s wife that I would speak with Dr Marin Olp and call her back with orders.

## 2021-04-01 NOTE — Telephone Encounter (Signed)
-----   Message from Drake Leach sent at 04/01/2021  9:07 AM EDT ----- Pts wife called and stated that he went to [facility] ED for a really bad nose bleed yesterday and didn't know if we needed to see him etc.  Req that we look at his blood work and they have instructions to call the ENT Dr. Laurance Flatten for a f/u appt.  I will place this as a phone note as well Webb Silversmith

## 2021-04-02 ENCOUNTER — Ambulatory Visit: Payer: PPO

## 2021-04-05 ENCOUNTER — Ambulatory Visit: Payer: PPO

## 2021-04-05 ENCOUNTER — Inpatient Hospital Stay (HOSPITAL_BASED_OUTPATIENT_CLINIC_OR_DEPARTMENT_OTHER): Payer: PPO | Admitting: Hematology & Oncology

## 2021-04-05 ENCOUNTER — Telehealth: Payer: Self-pay | Admitting: *Deleted

## 2021-04-05 ENCOUNTER — Inpatient Hospital Stay: Payer: PPO

## 2021-04-05 ENCOUNTER — Encounter: Payer: Self-pay | Admitting: Hematology & Oncology

## 2021-04-05 ENCOUNTER — Other Ambulatory Visit: Payer: Self-pay

## 2021-04-05 VITALS — BP 117/57 | HR 87 | Temp 97.8°F | Resp 20 | Wt 213.0 lb

## 2021-04-05 DIAGNOSIS — D46Z Other myelodysplastic syndromes: Secondary | ICD-10-CM | POA: Diagnosis not present

## 2021-04-05 DIAGNOSIS — Z5111 Encounter for antineoplastic chemotherapy: Secondary | ICD-10-CM | POA: Diagnosis not present

## 2021-04-05 DIAGNOSIS — R040 Epistaxis: Secondary | ICD-10-CM | POA: Diagnosis not present

## 2021-04-05 LAB — CMP (CANCER CENTER ONLY)
ALT: 28 U/L (ref 0–44)
AST: 31 U/L (ref 15–41)
Albumin: 4.2 g/dL (ref 3.5–5.0)
Alkaline Phosphatase: 47 U/L (ref 38–126)
Anion gap: 9 (ref 5–15)
BUN: 22 mg/dL (ref 8–23)
CO2: 23 mmol/L (ref 22–32)
Calcium: 9.5 mg/dL (ref 8.9–10.3)
Chloride: 98 mmol/L (ref 98–111)
Creatinine: 1.27 mg/dL — ABNORMAL HIGH (ref 0.61–1.24)
GFR, Estimated: >60 mL/min (ref >60)
Glucose, Bld: 182 mg/dL — ABNORMAL HIGH (ref 70–99)
Potassium: 4 mmol/L (ref 3.5–5.1)
Sodium: 130 mmol/L — ABNORMAL LOW (ref 135–145)
Total Bilirubin: 0.4 mg/dL (ref 0.3–1.2)
Total Protein: 7.6 g/dL (ref 6.5–8.1)

## 2021-04-05 LAB — CBC WITH DIFFERENTIAL (CANCER CENTER ONLY)
Abs Immature Granulocytes: 0.2 10*3/uL — ABNORMAL HIGH (ref 0.00–0.07)
Basophils Absolute: 0 10*3/uL (ref 0.0–0.1)
Basophils Relative: 0 %
Eosinophils Absolute: 0 10*3/uL (ref 0.0–0.5)
Eosinophils Relative: 0 %
HCT: 22.8 % — ABNORMAL LOW (ref 39.0–52.0)
Hemoglobin: 7.6 g/dL — ABNORMAL LOW (ref 13.0–17.0)
Lymphocytes Relative: 46 %
Lymphs Abs: 0.6 10*3/uL — ABNORMAL LOW (ref 0.7–4.0)
MCH: 31.1 pg (ref 26.0–34.0)
MCHC: 33.3 g/dL (ref 30.0–36.0)
MCV: 93.4 fL (ref 80.0–100.0)
Metamyelocytes Relative: 9 %
Monocytes Absolute: 0.2 10*3/uL (ref 0.1–1.0)
Monocytes Relative: 14 %
Myelocytes: 3 %
Neutro Abs: 0.4 10*3/uL — CL (ref 1.7–7.7)
Neutrophils Relative %: 28 %
Platelet Count: 42 10*3/uL — ABNORMAL LOW (ref 150–400)
RBC: 2.44 MIL/uL — ABNORMAL LOW (ref 4.22–5.81)
RDW: 16.6 % — ABNORMAL HIGH (ref 11.5–15.5)
WBC Count: 1.4 10*3/uL — ABNORMAL LOW (ref 4.0–10.5)
nRBC: 1.4 % — ABNORMAL HIGH (ref 0.0–0.2)

## 2021-04-05 LAB — RETICULOCYTES
Immature Retic Fract: 20.4 % — ABNORMAL HIGH (ref 2.3–15.9)
RBC.: 2.46 MIL/uL — ABNORMAL LOW (ref 4.22–5.81)
Retic Count, Absolute: 66.9 10*3/uL (ref 19.0–186.0)
Retic Ct Pct: 2.7 % (ref 0.4–3.1)

## 2021-04-05 LAB — SAMPLE TO BLOOD BANK

## 2021-04-05 LAB — SAVE SMEAR(SSMR), FOR PROVIDER SLIDE REVIEW

## 2021-04-05 NOTE — Telephone Encounter (Signed)
Dr. Ennever notified of ANC-0.4.  No new orders received at this time.  

## 2021-04-05 NOTE — Patient Instructions (Signed)
Implanted Port Insertion, Care After This sheet gives you information about how to care for yourself after your procedure. Your health care provider may also give you more specific instructions. If you have problems or questions, contact your health care provider. What can I expect after the procedure? After the procedure, it is common to have:  Discomfort at the port insertion site.  Bruising on the skin over the port. This should improve over 3-4 days. Follow these instructions at home: Port care  After your port is placed, you will get a manufacturer's information card. The card has information about your port. Keep this card with you at all times.  Take care of the port as told by your health care provider. Ask your health care provider if you or a family member can get training for taking care of the port at home. A home health care nurse may also take care of the port.  Make sure to remember what type of port you have. Incision care  Follow instructions from your health care provider about how to take care of your port insertion site. Make sure you: ? Wash your hands with soap and water before and after you change your bandage (dressing). If soap and water are not available, use hand sanitizer. ? Change your dressing as told by your health care provider. ? Leave stitches (sutures), skin glue, or adhesive strips in place. These skin closures may need to stay in place for 2 weeks or longer. If adhesive strip edges start to loosen and curl up, you may trim the loose edges. Do not remove adhesive strips completely unless your health care provider tells you to do that.  Check your port insertion site every day for signs of infection. Check for: ? Redness, swelling, or pain. ? Fluid or blood. ? Warmth. ? Pus or a bad smell.      Activity  Return to your normal activities as told by your health care provider. Ask your health care provider what activities are safe for you.  Do not  lift anything that is heavier than 10 lb (4.5 kg), or the limit that you are told, until your health care provider says that it is safe. General instructions  Take over-the-counter and prescription medicines only as told by your health care provider.  Do not take baths, swim, or use a hot tub until your health care provider approves. Ask your health care provider if you may take showers. You may only be allowed to take sponge baths.  Do not drive for 24 hours if you were given a sedative during your procedure.  Wear a medical alert bracelet in case of an emergency. This will tell any health care providers that you have a port.  Keep all follow-up visits as told by your health care provider. This is important. Contact a health care provider if:  You cannot flush your port with saline as directed, or you cannot draw blood from the port.  You have a fever or chills.  You have redness, swelling, or pain around your port insertion site.  You have fluid or blood coming from your port insertion site.  Your port insertion site feels warm to the touch.  You have pus or a bad smell coming from the port insertion site. Get help right away if:  You have chest pain or shortness of breath.  You have bleeding from your port that you cannot control. Summary  Take care of the port as told by your   health care provider. Keep the manufacturer's information card with you at all times.  Change your dressing as told by your health care provider.  Contact a health care provider if you have a fever or chills or if you have redness, swelling, or pain around your port insertion site.  Keep all follow-up visits as told by your health care provider. This information is not intended to replace advice given to you by your health care provider. Make sure you discuss any questions you have with your health care provider. Document Revised: 06/26/2018 Document Reviewed: 06/26/2018 Elsevier Patient Education   2021 Elsevier Inc.  

## 2021-04-05 NOTE — Progress Notes (Signed)
Hematology and Oncology Follow Up Visit  Theodore Jenkins [id number] Nov 06, 1946 75 y.o. 04/05/2021   Principle Diagnosis:   MDS -- RAEB-2  --  Normal cytogenetics;  IPSS = 5  Current Therapy:    Vidaza 75 mg/m2 IV q day d1-7  -- s/p cycle #2- start on 01/25/2021     Interim History:  Theodore Jenkins is in for follow-up.  He is doing a little bit better.  Unfortunate had another nosebleed.  He has a nasal "tampon" in the right nares.  He goes back to ENT today to have this removed.  His platelet count is better today at 42,000.  I would have to think that there might be an artery that might need to be cauterized.  We are going to hold off on his next cycle treatment until he has this procedure done.  I would just would not want to have anything complicate the nasal bleeding.  He did have a nice weekend.  He is eating okay.  He is having no problems with nausea or vomiting.  He has had no issues with his bowels or bladder.  There is been some bruising.  His hearts been doing okay.  Overall, I would have to say his performance status is ECOG 1..    Medications:  Current Outpatient Medications:  .  acetaminophen (TYLENOL) 500 MG tablet, Take 1,000 mg by mouth every 8 (eight) hours as needed for mild pain (or headaches). (Patient not taking: Reported on 03/02/2021), Disp: , Rfl:  .  amoxicillin (AMOXIL) 500 MG capsule, Take 2,000 mg by mouth See admin instructions. Take 2,000 mg by mouth one hour prior to dental appointments (Patient not taking: No sig reported), Disp: , Rfl:  .  ascorbic acid (VITAMIN C) 500 MG tablet, Take 500 mg by mouth daily with breakfast., Disp: , Rfl:  .  chlorhexidine (PERIDEX) 0.12 % solution, Use as directed 15 mLs in the mouth or throat 2 (two) times daily., Disp: 450 mL, Rfl: 3 .  Cyanocobalamin (VITAMIN B 12) 500 MCG TABS, Take 500 mcg by mouth daily., Disp: , Rfl:  .  dexamethasone (DECADRON) 4 MG tablet, Take 2 tablets (8 mg total) by mouth daily. Start the day  after chemotherapy ends for 2 days. Take with food., Disp: 30 tablet, Rfl: 1 .  ferrous sulfate 325 (65 FE) MG tablet, Take 325 mg by mouth daily with breakfast., Disp: , Rfl:  .  fluticasone (FLONASE) 50 MCG/ACT nasal spray, Place 2 sprays into both nostrils in the morning. (Patient not taking: No sig reported), Disp: , Rfl:  .  gabapentin (NEURONTIN) 300 MG capsule, Take 600 mg by mouth at bedtime. , Disp: , Rfl:  .  lidocaine-prilocaine (EMLA) cream, Apply to affected area once, Disp: 30 g, Rfl: 3 .  lisinopril (ZESTRIL) 5 MG tablet, TAKE 1 TABLET BY MOUTH DAILY (Patient taking differently: Take 5 mg by mouth in the morning.), Disp: 90 tablet, Rfl: 2 .  loratadine (CLARITIN) 10 MG tablet, Take 10 mg by mouth daily., Disp: , Rfl:  .  LORazepam (ATIVAN) 0.5 MG tablet, Take 1 tablet (0.5 mg total) by mouth every 6 (six) hours as needed (Nausea or vomiting). (Patient not taking: No sig reported), Disp: 30 tablet, Rfl: 0 .  metoprolol succinate (TOPROL-XL) 50 MG 24 hr tablet, Take 50 mg by mouth at bedtime., Disp: , Rfl:  .  Multiple Vitamins-Minerals (ONE-A-DAY 50 PLUS PO), Take 1 tablet by mouth daily with breakfast., Disp: , Rfl:  .  nitroGLYCERIN (NITROSTAT) 0.4 MG SL tablet, Place 1 tablet (0.4 mg total) under the tongue every 5 (five) minutes as needed for chest pain., Disp: 25 tablet, Rfl: 6 .  omeprazole (PRILOSEC) 20 MG capsule, Take 20 mg by mouth daily., Disp: , Rfl:  .  ondansetron (ZOFRAN) 8 MG tablet, Take 1 tablet (8 mg total) by mouth 2 (two) times daily as needed for refractory nausea / vomiting. Start on the third day after last day of chemotherapy. (Patient not taking: No sig reported), Disp: 30 tablet, Rfl: 1 .  ONETOUCH VERIO test strip, 1 each by Other route daily., Disp: , Rfl: 3 .  polyethylene glycol (MIRALAX / GLYCOLAX) 17 g packet, Take 17 g by mouth daily. (Patient not taking: Reported on 03/02/2021), Disp: 14 each, Rfl: 0 .  PRILOSEC OTC 20 MG tablet, Take 20 mg by mouth  daily at 6 PM., Disp: , Rfl:  .  prochlorperazine (COMPAZINE) 10 MG tablet, Take 1 tablet (10 mg total) by mouth every 6 (six) hours as needed (Nausea or vomiting). (Patient not taking: No sig reported), Disp: 30 tablet, Rfl: 1 .  rosuvastatin (CRESTOR) 10 MG tablet, Take 10 mg by mouth at bedtime., Disp: , Rfl:  .  sitaGLIPtin-metformin (JANUMET) 50-1000 MG tablet, Take 1 tablet by mouth 2 (two) times daily with a meal. , Disp: , Rfl:   Allergies:  Allergies  Allergen Reactions  . Oxycodone Nausea And Vomiting    Past Medical History, Surgical history, Social history, and Family History were reviewed and updated.  Review of Systems: Review of Systems  Constitutional: Negative.   HENT:  Negative.   Eyes: Negative.   Respiratory: Negative.   Cardiovascular: Negative.   Gastrointestinal: Negative.   Endocrine: Negative.   Genitourinary: Negative.    Musculoskeletal: Negative.   Skin: Positive for rash.  Neurological: Negative.   Hematological: Bruises/bleeds easily.  Psychiatric/Behavioral: Negative.     Physical Exam:  weight is 213 lb (96.6 kg). His oral temperature is 97.8 F (36.6 C). His blood pressure is 117/57 (abnormal) and his pulse is 87. His respiration is 20 and oxygen saturation is 100%.   Wt Readings from Last 3 Encounters:  04/05/21 213 lb (96.6 kg)  03/29/21 216 lb (98 kg)  03/02/21 214 lb (97.1 kg)    Physical Exam Vitals reviewed.  HENT:     Head: Normocephalic and atraumatic.  Eyes:     Pupils: Pupils are equal, round, and reactive to light.  Cardiovascular:     Rate and Rhythm: Normal rate and regular rhythm.     Heart sounds: Normal heart sounds.  Pulmonary:     Effort: Pulmonary effort is normal.     Breath sounds: Normal breath sounds.  Abdominal:     General: Bowel sounds are normal.     Palpations: Abdomen is soft.  Musculoskeletal:        General: No tenderness or deformity. Normal range of motion.     Cervical back: Normal range of  motion.  Lymphadenopathy:     Cervical: No cervical adenopathy.  Skin:    General: Skin is warm and dry.     Findings: No erythema or rash.     Comments: Skin exam does show some scattered ecchymoses.  I do not see any obvious petechia on his legs.  He has no rashes.  Neurological:     Mental Status: He is alert and oriented to person, place, and time.  Psychiatric:  Behavior: Behavior normal.        Thought Content: Thought content normal.        Judgment: Judgment normal.      Lab Results  Component Value Date   WBC 1.4 (L) 04/05/2021   HGB 7.6 (L) 04/05/2021   HCT 22.8 (L) 04/05/2021   MCV 93.4 04/05/2021   PLT 42 (L) 04/05/2021     Chemistry      Component Value Date/Time   NA 130 (L) 04/05/2021 0911   NA 131 (L) 12/09/2020 1629   K 4.0 04/05/2021 0911   CL 98 04/05/2021 0911   CO2 23 04/05/2021 0911   BUN 22 04/05/2021 0911   BUN 24 12/09/2020 1629   CREATININE 1.27 (H) 04/05/2021 0911      Component Value Date/Time   CALCIUM 9.5 04/05/2021 0911   ALKPHOS 47 04/05/2021 0911   AST 31 04/05/2021 0911   ALT 28 04/05/2021 0911   BILITOT 0.4 04/05/2021 0911      Impression and Plan: Mr. Liew is a very nice 75 year old white male.  He has high-grade myelodysplasia.  This clearly has been present for only about 7 months.  He had lab work done back in May 2021 which looked pretty normal.  He definitely has high-grade myelodysplasia.  He is trying to progress to acute myeloid leukemia.  I  will go ahead with a third cycle of Vidaza.  Again we will start this on Wednesday.   I really would like to add venetoclax.  I will have to think about this.  I still have to set him up with a another bone marrow biopsy.  I will have to get this set up for him after his third cycle of treatment.  Maybe we will think about adding the venetoclax depending on what we find with the bone marrow test.  We have to follow his labs weekly.    I will likely plan to see him  back in about 3 weeks.  Volanda Napoleon, MD 4/25/202211:12 AM

## 2021-04-06 ENCOUNTER — Inpatient Hospital Stay: Payer: PPO

## 2021-04-06 ENCOUNTER — Telehealth: Payer: Self-pay

## 2021-04-06 NOTE — Telephone Encounter (Signed)
appts made per 04/05/21 los and pt will gain a new asch at the 04/07/21 appts   Webb Silversmith

## 2021-04-07 ENCOUNTER — Other Ambulatory Visit: Payer: Self-pay

## 2021-04-07 ENCOUNTER — Inpatient Hospital Stay: Payer: PPO

## 2021-04-07 VITALS — BP 119/57 | HR 78 | Temp 98.9°F | Resp 18

## 2021-04-07 DIAGNOSIS — Z5111 Encounter for antineoplastic chemotherapy: Secondary | ICD-10-CM | POA: Diagnosis not present

## 2021-04-07 DIAGNOSIS — D46Z Other myelodysplastic syndromes: Secondary | ICD-10-CM

## 2021-04-07 MED ORDER — PALONOSETRON HCL INJECTION 0.25 MG/5ML
INTRAVENOUS | Status: AC
  Filled 2021-04-07: qty 5

## 2021-04-07 MED ORDER — SODIUM CHLORIDE 0.9 % IV SOLN
75.0000 mg/m2 | Freq: Once | INTRAVENOUS | Status: AC
  Administered 2021-04-07: 165 mg via INTRAVENOUS
  Filled 2021-04-07: qty 16.5

## 2021-04-07 MED ORDER — PALONOSETRON HCL INJECTION 0.25 MG/5ML
0.2500 mg | Freq: Once | INTRAVENOUS | Status: AC
  Administered 2021-04-07: 0.25 mg via INTRAVENOUS

## 2021-04-07 MED ORDER — HEPARIN SOD (PORK) LOCK FLUSH 100 UNIT/ML IV SOLN
500.0000 [IU] | Freq: Once | INTRAVENOUS | Status: AC | PRN
  Administered 2021-04-07: 500 [IU]
  Filled 2021-04-07: qty 5

## 2021-04-07 MED ORDER — SODIUM CHLORIDE 0.9 % IV SOLN
10.0000 mg | Freq: Once | INTRAVENOUS | Status: AC
  Administered 2021-04-07: 10 mg via INTRAVENOUS
  Filled 2021-04-07: qty 10

## 2021-04-07 MED ORDER — SODIUM CHLORIDE 0.9 % IV SOLN
Freq: Once | INTRAVENOUS | Status: AC
  Filled 2021-04-07: qty 250

## 2021-04-07 MED ORDER — SODIUM CHLORIDE 0.9% FLUSH
10.0000 mL | INTRAVENOUS | Status: DC | PRN
  Administered 2021-04-07: 10 mL
  Filled 2021-04-07: qty 10

## 2021-04-07 NOTE — Patient Instructions (Signed)
Azacitidine suspension for injection (subcutaneous use) What is this medicine? AZACITIDINE (ay za SITE i deen) is a chemotherapy drug. This medicine reduces the growth of cancer cells and can suppress the immune system. It is used for treating myelodysplastic syndrome or some types of leukemia. This medicine may be used for other purposes; ask your health care provider or pharmacist if you have questions. COMMON BRAND NAME(S): Vidaza What should I tell my health care provider before I take this medicine? They need to know if you have any of these conditions:  kidney disease  liver disease  liver tumors  an unusual or allergic reaction to azacitidine, mannitol, other medicines, foods, dyes, or preservatives  pregnant or trying to get pregnant  breast-feeding How should I use this medicine? This medicine is for injection under the skin. It is administered in a hospital or clinic by a specially trained health care professional. Talk to your pediatrician regarding the use of this medicine in children. While this drug may be prescribed for selected conditions, precautions do apply. Overdosage: If you think you have taken too much of this medicine contact a poison control center or emergency room at once. NOTE: This medicine is only for you. Do not share this medicine with others. What if I miss a dose? It is important not to miss your dose. Call your doctor or health care professional if you are unable to keep an appointment. What may interact with this medicine? Interactions have not been studied. Give your health care provider a list of all the medicines, herbs, non-prescription drugs, or dietary supplements you use. Also tell them if you smoke, drink alcohol, or use illegal drugs. Some items may interact with your medicine. This list may not describe all possible interactions. Give your health care provider a list of all the medicines, herbs, non-prescription drugs, or dietary supplements  you use. Also tell them if you smoke, drink alcohol, or use illegal drugs. Some items may interact with your medicine. What should I watch for while using this medicine? Visit your doctor for checks on your progress. This drug may make you feel generally unwell. This is not uncommon, as chemotherapy can affect healthy cells as well as cancer cells. Report any side effects. Continue your course of treatment even though you feel ill unless your doctor tells you to stop. In some cases, you may be given additional medicines to help with side effects. Follow all directions for their use. Call your doctor or health care professional for advice if you get a fever, chills or sore throat, or other symptoms of a cold or flu. Do not treat yourself. This drug decreases your body's ability to fight infections. Try to avoid being around people who are sick. This medicine may increase your risk to bruise or bleed. Call your doctor or health care professional if you notice any unusual bleeding. You may need blood work done while you are taking this medicine. Do not become pregnant while taking this medicine and for 6 months after the last dose. Women should inform their doctor if they wish to become pregnant or think they might be pregnant. Men should not father a child while taking this medicine and for 3 months after the last dose. There is a potential for serious side effects to an unborn child. Talk to your health care professional or pharmacist for more information. Do not breast-feed an infant while taking this medicine and for 1 week after the last dose. This medicine may interfere with   the ability to have a child. Talk with your doctor or health care professional if you are concerned about your fertility. What side effects may I notice from receiving this medicine? Side effects that you should report to your doctor or health care professional as soon as possible:  allergic reactions like skin rash, itching or  hives, swelling of the face, lips, or tongue  low blood counts - this medicine may decrease the number of white blood cells, red blood cells and platelets. You may be at increased risk for infections and bleeding.  signs of infection - fever or chills, cough, sore throat, pain passing urine  signs of decreased platelets or bleeding - bruising, pinpoint red spots on the skin, black, tarry stools, blood in the urine  signs of decreased red blood cells - unusually weak or tired, fainting spells, lightheadedness  signs and symptoms of kidney injury like trouble passing urine or change in the amount of urine  signs and symptoms of liver injury like dark yellow or brown urine; general ill feeling or flu-like symptoms; light-colored stools; loss of appetite; nausea; right upper belly pain; unusually weak or tired; yellowing of the eyes or skin Side effects that usually do not require medical attention (report to your doctor or health care professional if they continue or are bothersome):  constipation  diarrhea  nausea, vomiting  pain or redness at the injection site  unusually weak or tired This list may not describe all possible side effects. Call your doctor for medical advice about side effects. You may report side effects to FDA at 1-800-FDA-1088. Where should I keep my medicine? This drug is given in a hospital or clinic and will not be stored at home. NOTE: This sheet is a summary. It may not cover all possible information. If you have questions about this medicine, talk to your doctor, pharmacist, or health care provider.  2021 Elsevier/Gold Standard (2016-12-27 14:37:51)  

## 2021-04-08 ENCOUNTER — Other Ambulatory Visit: Payer: Self-pay

## 2021-04-08 ENCOUNTER — Inpatient Hospital Stay: Payer: PPO

## 2021-04-08 VITALS — BP 129/55 | HR 75 | Temp 98.8°F | Resp 20

## 2021-04-08 DIAGNOSIS — Z5111 Encounter for antineoplastic chemotherapy: Secondary | ICD-10-CM | POA: Diagnosis not present

## 2021-04-08 DIAGNOSIS — D46Z Other myelodysplastic syndromes: Secondary | ICD-10-CM

## 2021-04-08 MED ORDER — SODIUM CHLORIDE 0.9 % IV SOLN
75.0000 mg/m2 | Freq: Once | INTRAVENOUS | Status: AC
  Administered 2021-04-08: 165 mg via INTRAVENOUS
  Filled 2021-04-08: qty 16.5

## 2021-04-08 MED ORDER — SODIUM CHLORIDE 0.9 % IV SOLN
Freq: Once | INTRAVENOUS | Status: AC
  Filled 2021-04-08: qty 250

## 2021-04-08 MED ORDER — SODIUM CHLORIDE 0.9% FLUSH
10.0000 mL | INTRAVENOUS | Status: DC | PRN
  Administered 2021-04-08: 10 mL
  Filled 2021-04-08: qty 10

## 2021-04-08 MED ORDER — SODIUM CHLORIDE 0.9 % IV SOLN
10.0000 mg | Freq: Once | INTRAVENOUS | Status: AC
  Administered 2021-04-08: 10 mg via INTRAVENOUS
  Filled 2021-04-08: qty 10

## 2021-04-08 MED ORDER — HEPARIN SOD (PORK) LOCK FLUSH 100 UNIT/ML IV SOLN
500.0000 [IU] | Freq: Once | INTRAVENOUS | Status: AC | PRN
  Administered 2021-04-08: 500 [IU]
  Filled 2021-04-08: qty 5

## 2021-04-08 NOTE — Patient Instructions (Signed)
Azacitidine suspension for injection (subcutaneous use) What is this medicine? AZACITIDINE (ay za SITE i deen) is a chemotherapy drug. This medicine reduces the growth of cancer cells and can suppress the immune system. It is used for treating myelodysplastic syndrome or some types of leukemia. This medicine may be used for other purposes; ask your health care provider or pharmacist if you have questions. COMMON BRAND NAME(S): Vidaza What should I tell my health care provider before I take this medicine? They need to know if you have any of these conditions:  kidney disease  liver disease  liver tumors  an unusual or allergic reaction to azacitidine, mannitol, other medicines, foods, dyes, or preservatives  pregnant or trying to get pregnant  breast-feeding How should I use this medicine? This medicine is for injection under the skin. It is administered in a hospital or clinic by a specially trained health care professional. Talk to your pediatrician regarding the use of this medicine in children. While this drug may be prescribed for selected conditions, precautions do apply. Overdosage: If you think you have taken too much of this medicine contact a poison control center or emergency room at once. NOTE: This medicine is only for you. Do not share this medicine with others. What if I miss a dose? It is important not to miss your dose. Call your doctor or health care professional if you are unable to keep an appointment. What may interact with this medicine? Interactions have not been studied. Give your health care provider a list of all the medicines, herbs, non-prescription drugs, or dietary supplements you use. Also tell them if you smoke, drink alcohol, or use illegal drugs. Some items may interact with your medicine. This list may not describe all possible interactions. Give your health care provider a list of all the medicines, herbs, non-prescription drugs, or dietary supplements  you use. Also tell them if you smoke, drink alcohol, or use illegal drugs. Some items may interact with your medicine. What should I watch for while using this medicine? Visit your doctor for checks on your progress. This drug may make you feel generally unwell. This is not uncommon, as chemotherapy can affect healthy cells as well as cancer cells. Report any side effects. Continue your course of treatment even though you feel ill unless your doctor tells you to stop. In some cases, you may be given additional medicines to help with side effects. Follow all directions for their use. Call your doctor or health care professional for advice if you get a fever, chills or sore throat, or other symptoms of a cold or flu. Do not treat yourself. This drug decreases your body's ability to fight infections. Try to avoid being around people who are sick. This medicine may increase your risk to bruise or bleed. Call your doctor or health care professional if you notice any unusual bleeding. You may need blood work done while you are taking this medicine. Do not become pregnant while taking this medicine and for 6 months after the last dose. Women should inform their doctor if they wish to become pregnant or think they might be pregnant. Men should not father a child while taking this medicine and for 3 months after the last dose. There is a potential for serious side effects to an unborn child. Talk to your health care professional or pharmacist for more information. Do not breast-feed an infant while taking this medicine and for 1 week after the last dose. This medicine may interfere with   the ability to have a child. Talk with your doctor or health care professional if you are concerned about your fertility. What side effects may I notice from receiving this medicine? Side effects that you should report to your doctor or health care professional as soon as possible:  allergic reactions like skin rash, itching or  hives, swelling of the face, lips, or tongue  low blood counts - this medicine may decrease the number of white blood cells, red blood cells and platelets. You may be at increased risk for infections and bleeding.  signs of infection - fever or chills, cough, sore throat, pain passing urine  signs of decreased platelets or bleeding - bruising, pinpoint red spots on the skin, black, tarry stools, blood in the urine  signs of decreased red blood cells - unusually weak or tired, fainting spells, lightheadedness  signs and symptoms of kidney injury like trouble passing urine or change in the amount of urine  signs and symptoms of liver injury like dark yellow or brown urine; general ill feeling or flu-like symptoms; light-colored stools; loss of appetite; nausea; right upper belly pain; unusually weak or tired; yellowing of the eyes or skin Side effects that usually do not require medical attention (report to your doctor or health care professional if they continue or are bothersome):  constipation  diarrhea  nausea, vomiting  pain or redness at the injection site  unusually weak or tired This list may not describe all possible side effects. Call your doctor for medical advice about side effects. You may report side effects to FDA at 1-800-FDA-1088. Where should I keep my medicine? This drug is given in a hospital or clinic and will not be stored at home. NOTE: This sheet is a summary. It may not cover all possible information. If you have questions about this medicine, talk to your doctor, pharmacist, or health care provider.  2021 Elsevier/Gold Standard (2016-12-27 14:37:51)  

## 2021-04-09 ENCOUNTER — Inpatient Hospital Stay: Payer: PPO

## 2021-04-09 ENCOUNTER — Other Ambulatory Visit: Payer: Self-pay

## 2021-04-09 VITALS — BP 113/47 | HR 84 | Temp 98.5°F | Resp 18

## 2021-04-09 DIAGNOSIS — D46Z Other myelodysplastic syndromes: Secondary | ICD-10-CM

## 2021-04-09 DIAGNOSIS — Z5111 Encounter for antineoplastic chemotherapy: Secondary | ICD-10-CM | POA: Diagnosis not present

## 2021-04-09 MED ORDER — SODIUM CHLORIDE 0.9% FLUSH
10.0000 mL | INTRAVENOUS | Status: DC | PRN
  Administered 2021-04-09: 10 mL
  Filled 2021-04-09: qty 10

## 2021-04-09 MED ORDER — PALONOSETRON HCL INJECTION 0.25 MG/5ML
INTRAVENOUS | Status: AC
  Filled 2021-04-09: qty 5

## 2021-04-09 MED ORDER — DEXAMETHASONE SODIUM PHOSPHATE 100 MG/10ML IJ SOLN
10.0000 mg | Freq: Once | INTRAMUSCULAR | Status: AC
  Administered 2021-04-09: 10 mg via INTRAVENOUS
  Filled 2021-04-09: qty 10

## 2021-04-09 MED ORDER — PALONOSETRON HCL INJECTION 0.25 MG/5ML
0.2500 mg | Freq: Once | INTRAVENOUS | Status: AC
Start: 2021-04-09 — End: 2021-04-09
  Administered 2021-04-09: 0.25 mg via INTRAVENOUS

## 2021-04-09 MED ORDER — SODIUM CHLORIDE 0.9 % IV SOLN
Freq: Once | INTRAVENOUS | Status: AC
  Filled 2021-04-09: qty 250

## 2021-04-09 MED ORDER — SODIUM CHLORIDE 0.9 % IV SOLN
75.0000 mg/m2 | Freq: Once | INTRAVENOUS | Status: AC
  Administered 2021-04-09: 165 mg via INTRAVENOUS
  Filled 2021-04-09: qty 16.5

## 2021-04-09 MED ORDER — HEPARIN SOD (PORK) LOCK FLUSH 100 UNIT/ML IV SOLN
500.0000 [IU] | Freq: Once | INTRAVENOUS | Status: AC | PRN
  Administered 2021-04-09: 500 [IU]
  Filled 2021-04-09: qty 5

## 2021-04-12 ENCOUNTER — Other Ambulatory Visit: Payer: Self-pay | Admitting: *Deleted

## 2021-04-12 ENCOUNTER — Inpatient Hospital Stay: Payer: PPO

## 2021-04-12 ENCOUNTER — Other Ambulatory Visit: Payer: Self-pay

## 2021-04-12 ENCOUNTER — Telehealth: Payer: Self-pay | Admitting: *Deleted

## 2021-04-12 ENCOUNTER — Inpatient Hospital Stay: Payer: PPO | Attending: Hematology & Oncology

## 2021-04-12 VITALS — BP 133/74 | HR 70 | Temp 98.8°F | Resp 18

## 2021-04-12 DIAGNOSIS — Z5111 Encounter for antineoplastic chemotherapy: Secondary | ICD-10-CM | POA: Insufficient documentation

## 2021-04-12 DIAGNOSIS — D696 Thrombocytopenia, unspecified: Secondary | ICD-10-CM

## 2021-04-12 DIAGNOSIS — C9200 Acute myeloblastic leukemia, not having achieved remission: Secondary | ICD-10-CM | POA: Diagnosis not present

## 2021-04-12 DIAGNOSIS — Z79899 Other long term (current) drug therapy: Secondary | ICD-10-CM | POA: Diagnosis not present

## 2021-04-12 DIAGNOSIS — D649 Anemia, unspecified: Secondary | ICD-10-CM

## 2021-04-12 DIAGNOSIS — D46Z Other myelodysplastic syndromes: Secondary | ICD-10-CM

## 2021-04-12 LAB — CBC WITH DIFFERENTIAL (CANCER CENTER ONLY)
Abs Immature Granulocytes: 0.7 10*3/uL — ABNORMAL HIGH (ref 0.00–0.07)
Band Neutrophils: 5 %
Basophils Absolute: 0 10*3/uL (ref 0.0–0.1)
Basophils Relative: 0 %
Blasts: 6 %
Eosinophils Absolute: 0 10*3/uL (ref 0.0–0.5)
Eosinophils Relative: 0 %
HCT: 19.6 % — ABNORMAL LOW (ref 39.0–52.0)
Hemoglobin: 6.6 g/dL — CL (ref 13.0–17.0)
Lymphocytes Relative: 35 %
Lymphs Abs: 1 10*3/uL (ref 0.7–4.0)
MCH: 31.7 pg (ref 26.0–34.0)
MCHC: 33.7 g/dL (ref 30.0–36.0)
MCV: 94.2 fL (ref 80.0–100.0)
Metamyelocytes Relative: 7 %
Monocytes Absolute: 0.1 10*3/uL (ref 0.1–1.0)
Monocytes Relative: 2 %
Myelocytes: 17 %
Neutro Abs: 0.9 10*3/uL — ABNORMAL LOW (ref 1.7–7.7)
Neutrophils Relative %: 28 %
Platelet Count: 35 10*3/uL — ABNORMAL LOW (ref 150–400)
RBC: 2.08 MIL/uL — ABNORMAL LOW (ref 4.22–5.81)
RDW: 17 % — ABNORMAL HIGH (ref 11.5–15.5)
WBC Count: 2.8 10*3/uL — ABNORMAL LOW (ref 4.0–10.5)
WBC Morphology: 6
nRBC: 2.9 % — ABNORMAL HIGH (ref 0.0–0.2)

## 2021-04-12 LAB — BASIC METABOLIC PANEL - CANCER CENTER ONLY
Anion gap: 8 (ref 5–15)
BUN: 23 mg/dL (ref 8–23)
CO2: 22 mmol/L (ref 22–32)
Calcium: 9.3 mg/dL (ref 8.9–10.3)
Chloride: 98 mmol/L (ref 98–111)
Creatinine: 1.37 mg/dL — ABNORMAL HIGH (ref 0.61–1.24)
GFR, Estimated: 55 mL/min — ABNORMAL LOW (ref >60)
Glucose, Bld: 242 mg/dL — ABNORMAL HIGH (ref 70–99)
Potassium: 4 mmol/L (ref 3.5–5.1)
Sodium: 128 mmol/L — ABNORMAL LOW (ref 135–145)

## 2021-04-12 LAB — PREPARE RBC (CROSSMATCH)

## 2021-04-12 MED ORDER — ACETAMINOPHEN 325 MG PO TABS
ORAL_TABLET | ORAL | Status: AC
  Filled 2021-04-12: qty 2

## 2021-04-12 MED ORDER — SODIUM CHLORIDE 0.9 % IV SOLN
75.0000 mg/m2 | Freq: Once | INTRAVENOUS | Status: AC
  Administered 2021-04-12: 165 mg via INTRAVENOUS
  Filled 2021-04-12: qty 16.5

## 2021-04-12 MED ORDER — ACETAMINOPHEN 325 MG PO TABS
650.0000 mg | ORAL_TABLET | Freq: Once | ORAL | Status: AC
  Administered 2021-04-12: 650 mg via ORAL

## 2021-04-12 MED ORDER — SODIUM CHLORIDE 0.9% IV SOLUTION
250.0000 mL | Freq: Once | INTRAVENOUS | Status: AC
  Administered 2021-04-12: 250 mL via INTRAVENOUS
  Filled 2021-04-12: qty 250

## 2021-04-12 MED ORDER — HEPARIN SOD (PORK) LOCK FLUSH 100 UNIT/ML IV SOLN
500.0000 [IU] | Freq: Every day | INTRAVENOUS | Status: AC | PRN
Start: 2021-04-12 — End: 2021-04-12
  Administered 2021-04-12: 500 [IU]
  Filled 2021-04-12: qty 5

## 2021-04-12 MED ORDER — SODIUM CHLORIDE 0.9 % IV SOLN
Freq: Once | INTRAVENOUS | Status: AC
  Filled 2021-04-12: qty 250

## 2021-04-12 MED ORDER — DIPHENHYDRAMINE HCL 25 MG PO CAPS
ORAL_CAPSULE | ORAL | Status: AC
  Filled 2021-04-12: qty 1

## 2021-04-12 MED ORDER — HEPARIN SOD (PORK) LOCK FLUSH 100 UNIT/ML IV SOLN
500.0000 [IU] | Freq: Every day | INTRAVENOUS | Status: DC | PRN
  Filled 2021-04-12: qty 5

## 2021-04-12 MED ORDER — SODIUM CHLORIDE 0.9% FLUSH
10.0000 mL | INTRAVENOUS | Status: AC | PRN
Start: 2021-04-12 — End: 2021-04-12
  Administered 2021-04-12: 10 mL
  Filled 2021-04-12: qty 10

## 2021-04-12 MED ORDER — SODIUM CHLORIDE 0.9 % IV SOLN
10.0000 mg | Freq: Once | INTRAVENOUS | Status: AC
  Administered 2021-04-12: 10 mg via INTRAVENOUS
  Filled 2021-04-12: qty 10

## 2021-04-12 MED ORDER — DIPHENHYDRAMINE HCL 25 MG PO CAPS
25.0000 mg | ORAL_CAPSULE | Freq: Once | ORAL | Status: AC
  Administered 2021-04-12: 25 mg via ORAL

## 2021-04-12 NOTE — Telephone Encounter (Signed)
Hgb 6.6 reported by lab.  Dr Marin Olp notified.  1 u PRBC ordered.  Proceed with chemo.  Orders placed

## 2021-04-12 NOTE — Progress Notes (Signed)
Per Dr. Ennever, it's OK to treat with today's labs.   

## 2021-04-12 NOTE — Patient Instructions (Signed)
Azacitidine suspension for injection (subcutaneous use) What is this medicine? AZACITIDINE (ay za SITE i deen) is a chemotherapy drug. This medicine reduces the growth of cancer cells and can suppress the immune system. It is used for treating myelodysplastic syndrome or some types of leukemia. This medicine may be used for other purposes; ask your health care provider or pharmacist if you have questions. COMMON BRAND NAME(S): Vidaza What should I tell my health care provider before I take this medicine? They need to know if you have any of these conditions:  kidney disease  liver disease  liver tumors  an unusual or allergic reaction to azacitidine, mannitol, other medicines, foods, dyes, or preservatives  pregnant or trying to get pregnant  breast-feeding How should I use this medicine? This medicine is for injection under the skin. It is administered in a hospital or clinic by a specially trained health care professional. Talk to your pediatrician regarding the use of this medicine in children. While this drug may be prescribed for selected conditions, precautions do apply. Overdosage: If you think you have taken too much of this medicine contact a poison control center or emergency room at once. NOTE: This medicine is only for you. Do not share this medicine with others. What if I miss a dose? It is important not to miss your dose. Call your doctor or health care professional if you are unable to keep an appointment. What may interact with this medicine? Interactions have not been studied. Give your health care provider a list of all the medicines, herbs, non-prescription drugs, or dietary supplements you use. Also tell them if you smoke, drink alcohol, or use illegal drugs. Some items may interact with your medicine. This list may not describe all possible interactions. Give your health care provider a list of all the medicines, herbs, non-prescription drugs, or dietary supplements  you use. Also tell them if you smoke, drink alcohol, or use illegal drugs. Some items may interact with your medicine. What should I watch for while using this medicine? Visit your doctor for checks on your progress. This drug may make you feel generally unwell. This is not uncommon, as chemotherapy can affect healthy cells as well as cancer cells. Report any side effects. Continue your course of treatment even though you feel ill unless your doctor tells you to stop. In some cases, you may be given additional medicines to help with side effects. Follow all directions for their use. Call your doctor or health care professional for advice if you get a fever, chills or sore throat, or other symptoms of a cold or flu. Do not treat yourself. This drug decreases your body's ability to fight infections. Try to avoid being around people who are sick. This medicine may increase your risk to bruise or bleed. Call your doctor or health care professional if you notice any unusual bleeding. You may need blood work done while you are taking this medicine. Do not become pregnant while taking this medicine and for 6 months after the last dose. Women should inform their doctor if they wish to become pregnant or think they might be pregnant. Men should not father a child while taking this medicine and for 3 months after the last dose. There is a potential for serious side effects to an unborn child. Talk to your health care professional or pharmacist for more information. Do not breast-feed an infant while taking this medicine and for 1 week after the last dose. This medicine may interfere with   the ability to have a child. Talk with your doctor or health care professional if you are concerned about your fertility. What side effects may I notice from receiving this medicine? Side effects that you should report to your doctor or health care professional as soon as possible:  allergic reactions like skin rash, itching or  hives, swelling of the face, lips, or tongue  low blood counts - this medicine may decrease the number of white blood cells, red blood cells and platelets. You may be at increased risk for infections and bleeding.  signs of infection - fever or chills, cough, sore throat, pain passing urine  signs of decreased platelets or bleeding - bruising, pinpoint red spots on the skin, black, tarry stools, blood in the urine  signs of decreased red blood cells - unusually weak or tired, fainting spells, lightheadedness  signs and symptoms of kidney injury like trouble passing urine or change in the amount of urine  signs and symptoms of liver injury like dark yellow or brown urine; general ill feeling or flu-like symptoms; light-colored stools; loss of appetite; nausea; right upper belly pain; unusually weak or tired; yellowing of the eyes or skin Side effects that usually do not require medical attention (report to your doctor or health care professional if they continue or are bothersome):  constipation  diarrhea  nausea, vomiting  pain or redness at the injection site  unusually weak or tired This list may not describe all possible side effects. Call your doctor for medical advice about side effects. You may report side effects to FDA at 1-800-FDA-1088. Where should I keep my medicine? This drug is given in a hospital or clinic and will not be stored at home. NOTE: This sheet is a summary. It may not cover all possible information. If you have questions about this medicine, talk to your doctor, pharmacist, or health care provider.  2021 Elsevier/Gold Standard (2016-12-27 14:37:51)  

## 2021-04-12 NOTE — Patient Instructions (Signed)

## 2021-04-13 ENCOUNTER — Inpatient Hospital Stay: Payer: PPO

## 2021-04-13 VITALS — BP 113/59 | HR 77 | Temp 98.9°F | Resp 18

## 2021-04-13 DIAGNOSIS — D46Z Other myelodysplastic syndromes: Secondary | ICD-10-CM

## 2021-04-13 DIAGNOSIS — Z5111 Encounter for antineoplastic chemotherapy: Secondary | ICD-10-CM | POA: Diagnosis not present

## 2021-04-13 MED ORDER — SODIUM CHLORIDE 0.9 % IV SOLN
Freq: Once | INTRAVENOUS | Status: AC
  Filled 2021-04-13: qty 250

## 2021-04-13 MED ORDER — PALONOSETRON HCL INJECTION 0.25 MG/5ML: 0.2500 mg | Freq: Once | INTRAVENOUS | Status: DC

## 2021-04-13 MED ORDER — SODIUM CHLORIDE 0.9% FLUSH
10.0000 mL | INTRAVENOUS | Status: DC | PRN
  Administered 2021-04-13: 10 mL
  Filled 2021-04-13: qty 10

## 2021-04-13 MED ORDER — SODIUM CHLORIDE 0.9 % IV SOLN
10.0000 mg | Freq: Once | INTRAVENOUS | Status: AC
  Administered 2021-04-13: 10 mg via INTRAVENOUS
  Filled 2021-04-13: qty 10

## 2021-04-13 MED ORDER — HEPARIN SOD (PORK) LOCK FLUSH 100 UNIT/ML IV SOLN
500.0000 [IU] | Freq: Once | INTRAVENOUS | Status: AC | PRN
  Administered 2021-04-13: 500 [IU]
  Filled 2021-04-13: qty 5

## 2021-04-13 MED ORDER — SODIUM CHLORIDE 0.9 % IV SOLN
75.0000 mg/m2 | Freq: Once | INTRAVENOUS | Status: AC
  Administered 2021-04-13: 165 mg via INTRAVENOUS
  Filled 2021-04-13: qty 16.5

## 2021-04-13 NOTE — Patient Instructions (Addendum)
Azacitidine suspension for injection (subcutaneous use) What is this medicine? AZACITIDINE (ay za SITE i deen) is a chemotherapy drug. This medicine reduces the growth of cancer cells and can suppress the immune system. It is used for treating myelodysplastic syndrome or some types of leukemia. This medicine may be used for other purposes; ask your health care provider or pharmacist if you have questions. COMMON BRAND NAME(S): Vidaza What should I tell my health care provider before I take this medicine? They need to know if you have any of these conditions:  kidney disease  liver disease  liver tumors  an unusual or allergic reaction to azacitidine, mannitol, other medicines, foods, dyes, or preservatives  pregnant or trying to get pregnant  breast-feeding How should I use this medicine? This medicine is for injection under the skin. It is administered in a hospital or clinic by a specially trained health care professional. Talk to your pediatrician regarding the use of this medicine in children. While this drug may be prescribed for selected conditions, precautions do apply. Overdosage: If you think you have taken too much of this medicine contact a poison control center or emergency room at once. NOTE: This medicine is only for you. Do not share this medicine with others. What if I miss a dose? It is important not to miss your dose. Call your doctor or health care professional if you are unable to keep an appointment. What may interact with this medicine? Interactions have not been studied. Give your health care provider a list of all the medicines, herbs, non-prescription drugs, or dietary supplements you use. Also tell them if you smoke, drink alcohol, or use illegal drugs. Some items may interact with your medicine. This list may not describe all possible interactions. Give your health care provider a list of all the medicines, herbs, non-prescription drugs, or dietary supplements  you use. Also tell them if you smoke, drink alcohol, or use illegal drugs. Some items may interact with your medicine. What should I watch for while using this medicine? Visit your doctor for checks on your progress. This drug may make you feel generally unwell. This is not uncommon, as chemotherapy can affect healthy cells as well as cancer cells. Report any side effects. Continue your course of treatment even though you feel ill unless your doctor tells you to stop. In some cases, you may be given additional medicines to help with side effects. Follow all directions for their use. Call your doctor or health care professional for advice if you get a fever, chills or sore throat, or other symptoms of a cold or flu. Do not treat yourself. This drug decreases your body's ability to fight infections. Try to avoid being around people who are sick. This medicine may increase your risk to bruise or bleed. Call your doctor or health care professional if you notice any unusual bleeding. You may need blood work done while you are taking this medicine. Do not become pregnant while taking this medicine and for 6 months after the last dose. Women should inform their doctor if they wish to become pregnant or think they might be pregnant. Men should not father a child while taking this medicine and for 3 months after the last dose. There is a potential for serious side effects to an unborn child. Talk to your health care professional or pharmacist for more information. Do not breast-feed an infant while taking this medicine and for 1 week after the last dose. This medicine may interfere with   the ability to have a child. Talk with your doctor or health care professional if you are concerned about your fertility. What side effects may I notice from receiving this medicine? Side effects that you should report to your doctor or health care professional as soon as possible:  allergic reactions like skin rash, itching or  hives, swelling of the face, lips, or tongue  low blood counts - this medicine may decrease the number of white blood cells, red blood cells and platelets. You may be at increased risk for infections and bleeding.  signs of infection - fever or chills, cough, sore throat, pain passing urine  signs of decreased platelets or bleeding - bruising, pinpoint red spots on the skin, black, tarry stools, blood in the urine  signs of decreased red blood cells - unusually weak or tired, fainting spells, lightheadedness  signs and symptoms of kidney injury like trouble passing urine or change in the amount of urine  signs and symptoms of liver injury like dark yellow or brown urine; general ill feeling or flu-like symptoms; light-colored stools; loss of appetite; nausea; right upper belly pain; unusually weak or tired; yellowing of the eyes or skin Side effects that usually do not require medical attention (report to your doctor or health care professional if they continue or are bothersome):  constipation  diarrhea  nausea, vomiting  pain or redness at the injection site  unusually weak or tired This list may not describe all possible side effects. Call your doctor for medical advice about side effects. You may report side effects to FDA at 1-800-FDA-1088. Where should I keep my medicine? This drug is given in a hospital or clinic and will not be stored at home. NOTE: This sheet is a summary. It may not cover all possible information. If you have questions about this medicine, talk to your doctor, pharmacist, or health care provider.  2021 Elsevier/Gold Standard (2016-12-27 14:37:51) [facility]  Discharge Instructions: Thank you for choosing [facility] to provide your oncology and hematology care.   If you have a lab appointment with the [facility], please go directly to the [facility] and check in at the registration area.  Wear comfortable  clothing and clothing appropriate for easy access to any Portacath or PICC line.   We strive to give you quality time with your provider. You may need to reschedule your appointment if you arrive late (15 or more minutes).  Arriving late affects you and other patients whose appointments are after yours.  Also, if you miss three or more appointments without notifying the office, you may be dismissed from the clinic at the provider's discretion.      For prescription refill requests, have your pharmacy contact our office and allow 72 hours for refills to be completed.    Today you received the following chemotherapy and/or immunotherapy agents Vidaza.    To help prevent nausea and vomiting after your treatment, we encourage you to take your nausea medication as directed.  BELOW ARE SYMPTOMS THAT SHOULD BE REPORTED IMMEDIATELY: . *FEVER GREATER THAN 100.4 F (38 C) OR HIGHER . *CHILLS OR SWEATING . *NAUSEA AND VOMITING THAT IS NOT CONTROLLED WITH YOUR NAUSEA MEDICATION . *UNUSUAL SHORTNESS OF BREATH . *UNUSUAL BRUISING OR BLEEDING . *URINARY PROBLEMS (pain or burning when urinating, or frequent urination) . *BOWEL PROBLEMS (unusual diarrhea, constipation, pain near the anus) . TENDERNESS IN MOUTH AND THROAT WITH OR WITHOUT PRESENCE OF ULCERS (sore  throat, sores in mouth, or a toothache) . UNUSUAL RASH, SWELLING OR PAIN  . UNUSUAL VAGINAL DISCHARGE OR ITCHING   Items with * indicate a potential emergency and should be followed up as soon as possible or go to the Emergency Department if any problems should occur.  Please show the CHEMOTHERAPY ALERT CARD or IMMUNOTHERAPY ALERT CARD at check-in to the Emergency Department and triage nurse. Should you have questions after your visit or need to cancel or reschedule your appointment, please contact [facility]  [phone] and follow the prompts.  Office hours are 8:00 a.m. to 4:30 p.m. Monday - Friday. Please note that  voicemails left after 4:00 p.m. may not be returned until the following business day.  We are closed weekends and major holidays. You have access to a nurse at all times for urgent questions. Please call the main number to the clinic [phone] and follow the prompts.  For any non-urgent questions, you may also contact your provider using MyChart. We now offer e-Visits for anyone 36 and older to request care online for non-urgent symptoms. For details visit [email or web address].   Also download the MyChart app! Go to the app store, search "MyChart", open the app, select [facility], and log in with your MyChart username and password.  Due to Covid, a mask is required upon entering the hospital/clinic. If you do not have a mask, one will be given to you upon arrival. For doctor visits, patients may have 1 support person aged 34 or older with them. For treatment visits, patients cannot have anyone with them due to current Covid guidelines and our immunocompromised population.

## 2021-04-14 ENCOUNTER — Inpatient Hospital Stay: Payer: PPO

## 2021-04-14 ENCOUNTER — Other Ambulatory Visit: Payer: Self-pay | Admitting: *Deleted

## 2021-04-14 ENCOUNTER — Other Ambulatory Visit: Payer: Self-pay

## 2021-04-14 VITALS — BP 113/54 | HR 73 | Temp 99.2°F | Resp 18

## 2021-04-14 DIAGNOSIS — D46Z Other myelodysplastic syndromes: Secondary | ICD-10-CM

## 2021-04-14 DIAGNOSIS — D649 Anemia, unspecified: Secondary | ICD-10-CM

## 2021-04-14 DIAGNOSIS — D696 Thrombocytopenia, unspecified: Secondary | ICD-10-CM

## 2021-04-14 DIAGNOSIS — Z5111 Encounter for antineoplastic chemotherapy: Secondary | ICD-10-CM | POA: Diagnosis not present

## 2021-04-14 LAB — CBC WITH DIFFERENTIAL (CANCER CENTER ONLY)
Abs Immature Granulocytes: 0.67 10*3/uL — ABNORMAL HIGH (ref 0.00–0.07)
Basophils Absolute: 0 10*3/uL (ref 0.0–0.1)
Basophils Relative: 0 %
Eosinophils Absolute: 0 10*3/uL (ref 0.0–0.5)
Eosinophils Relative: 0 %
HCT: 21.3 % — ABNORMAL LOW (ref 39.0–52.0)
Hemoglobin: 7.3 g/dL — ABNORMAL LOW (ref 13.0–17.0)
Immature Granulocytes: 19 %
Lymphocytes Relative: 26 %
Lymphs Abs: 0.9 10*3/uL (ref 0.7–4.0)
MCH: 31.5 pg (ref 26.0–34.0)
MCHC: 34.3 g/dL (ref 30.0–36.0)
MCV: 91.8 fL (ref 80.0–100.0)
Monocytes Absolute: 0.5 10*3/uL (ref 0.1–1.0)
Monocytes Relative: 15 %
Neutro Abs: 1.5 10*3/uL — ABNORMAL LOW (ref 1.7–7.7)
Neutrophils Relative %: 40 %
Platelet Count: 22 10*3/uL — ABNORMAL LOW (ref 150–400)
RBC: 2.32 MIL/uL — ABNORMAL LOW (ref 4.22–5.81)
RDW: 16.4 % — ABNORMAL HIGH (ref 11.5–15.5)
WBC Count: 3.6 10*3/uL — ABNORMAL LOW (ref 4.0–10.5)
nRBC: 4.7 % — ABNORMAL HIGH (ref 0.0–0.2)

## 2021-04-14 LAB — CMP (CANCER CENTER ONLY)
ALT: 15 U/L (ref 0–44)
AST: 19 U/L (ref 15–41)
Albumin: 4 g/dL (ref 3.5–5.0)
Alkaline Phosphatase: 47 U/L (ref 38–126)
Anion gap: 8 (ref 5–15)
BUN: 30 mg/dL — ABNORMAL HIGH (ref 8–23)
CO2: 23 mmol/L (ref 22–32)
Calcium: 9.7 mg/dL (ref 8.9–10.3)
Chloride: 97 mmol/L — ABNORMAL LOW (ref 98–111)
Creatinine: 1.31 mg/dL — ABNORMAL HIGH (ref 0.61–1.24)
GFR, Estimated: 58 mL/min — ABNORMAL LOW (ref >60)
Glucose, Bld: 146 mg/dL — ABNORMAL HIGH (ref 70–99)
Potassium: 3.6 mmol/L (ref 3.5–5.1)
Sodium: 128 mmol/L — ABNORMAL LOW (ref 135–145)
Total Bilirubin: 0.5 mg/dL (ref 0.3–1.2)
Total Protein: 7 g/dL (ref 6.5–8.1)

## 2021-04-14 LAB — PREPARE RBC (CROSSMATCH)

## 2021-04-14 MED ORDER — SODIUM CHLORIDE 0.9 % IV SOLN
Freq: Once | INTRAVENOUS | Status: AC
  Filled 2021-04-14: qty 250

## 2021-04-14 MED ORDER — SODIUM CHLORIDE 0.9% FLUSH
10.0000 mL | INTRAVENOUS | Status: DC | PRN
  Administered 2021-04-14: 10 mL
  Filled 2021-04-14: qty 10

## 2021-04-14 MED ORDER — SODIUM CHLORIDE 0.9 % IV SOLN
10.0000 mg | Freq: Once | INTRAVENOUS | Status: AC
Start: 2021-04-14 — End: 2021-04-14
  Administered 2021-04-14: 10 mg via INTRAVENOUS
  Filled 2021-04-14: qty 10

## 2021-04-14 MED ORDER — ACETAMINOPHEN 325 MG PO TABS
650.0000 mg | ORAL_TABLET | Freq: Once | ORAL | Status: AC
  Administered 2021-04-14: 650 mg via ORAL

## 2021-04-14 MED ORDER — DIPHENHYDRAMINE HCL 25 MG PO CAPS
ORAL_CAPSULE | ORAL | Status: AC
  Filled 2021-04-14: qty 1

## 2021-04-14 MED ORDER — ACETAMINOPHEN 325 MG PO TABS
ORAL_TABLET | ORAL | Status: AC
  Filled 2021-04-14: qty 2

## 2021-04-14 MED ORDER — SODIUM CHLORIDE 0.9 % IV SOLN
75.0000 mg/m2 | Freq: Once | INTRAVENOUS | Status: AC
  Administered 2021-04-14: 165 mg via INTRAVENOUS
  Filled 2021-04-14: qty 16.5

## 2021-04-14 MED ORDER — PALONOSETRON HCL INJECTION 0.25 MG/5ML
INTRAVENOUS | Status: AC
  Filled 2021-04-14: qty 5

## 2021-04-14 MED ORDER — SODIUM CHLORIDE 0.9% IV SOLUTION
250.0000 mL | Freq: Once | INTRAVENOUS | Status: AC
Start: 2021-04-14 — End: 2021-04-14
  Administered 2021-04-14: 250 mL via INTRAVENOUS
  Filled 2021-04-14: qty 250

## 2021-04-14 MED ORDER — DIPHENHYDRAMINE HCL 25 MG PO CAPS
25.0000 mg | ORAL_CAPSULE | Freq: Once | ORAL | Status: AC
  Administered 2021-04-14: 25 mg via ORAL

## 2021-04-14 MED ORDER — PALONOSETRON HCL INJECTION 0.25 MG/5ML
0.2500 mg | Freq: Once | INTRAVENOUS | Status: AC
  Administered 2021-04-14: 0.25 mg via INTRAVENOUS

## 2021-04-14 MED ORDER — HEPARIN SOD (PORK) LOCK FLUSH 100 UNIT/ML IV SOLN
500.0000 [IU] | Freq: Once | INTRAVENOUS | Status: AC | PRN
  Administered 2021-04-14: 500 [IU]
  Filled 2021-04-14: qty 5

## 2021-04-14 NOTE — Patient Instructions (Signed)
[facility]  Discharge Instructions: °Thank you for choosing [facility] to provide your oncology and hematology care.  ° °If you have a lab appointment with the Cancer Center, please go directly to the Cancer Center and check in at the registration area. ° °Wear comfortable clothing and clothing appropriate for easy access to any Portacath or PICC line.  ° °We strive to give you quality time with your provider. You may need to reschedule your appointment if you arrive late (15 or more minutes).  Arriving late affects you and other patients whose appointments are after yours.  Also, if you miss three or more appointments without notifying the office, you may be dismissed from the clinic at the provider’s discretion.    °  °For prescription refill requests, have your pharmacy contact our office and allow 72 hours for refills to be completed.   ° °Today you received the following chemotherapy and/or immunotherapy agents Vidaza °  °To help prevent nausea and vomiting after your treatment, we encourage you to take your nausea medication as directed. ° °BELOW ARE SYMPTOMS THAT SHOULD BE REPORTED IMMEDIATELY: °*FEVER GREATER THAN 100.4 F (38 °C) OR HIGHER °*CHILLS OR SWEATING °*NAUSEA AND VOMITING THAT IS NOT CONTROLLED WITH YOUR NAUSEA MEDICATION °*UNUSUAL SHORTNESS OF BREATH °*UNUSUAL BRUISING OR BLEEDING °*URINARY PROBLEMS (pain or burning when urinating, or frequent urination) °*BOWEL PROBLEMS (unusual diarrhea, constipation, pain near the anus) °TENDERNESS IN MOUTH AND THROAT WITH OR WITHOUT PRESENCE OF ULCERS (sore throat, sores in mouth, or a toothache) °UNUSUAL RASH, SWELLING OR PAIN  °UNUSUAL VAGINAL DISCHARGE OR ITCHING  ° °Items with * indicate a potential emergency and should be followed up as soon as possible or go to the Emergency Department if any problems should occur. ° °Please show the CHEMOTHERAPY ALERT CARD or IMMUNOTHERAPY ALERT CARD at check-in to the  Emergency Department and triage nurse. °Should you have questions after your visit or need to cancel or reschedule your appointment, please contact [facility]  [phone] and follow the prompts.  Office hours are 8:00 a.m. to 4:30 p.m. Monday - Friday. Please note that voicemails left after 4:00 p.m. may not be returned until the following business day.  We are closed weekends and major holidays. You have access to a nurse at all times for urgent questions. Please call the main number to the clinic [phone] and follow the prompts. ° °For any non-urgent questions, you may also contact your provider using MyChart. We now offer e-Visits for anyone 18 and older to request care online for non-urgent symptoms. For details visit [email or web address]. °  °Also download the MyChart app! Go to the app store, search "MyChart", open the app, select [facility], and log in with your MyChart username and password. ° °Due to Covid, a mask is required upon entering the hospital/clinic. If you do not have a mask, one will be given to you upon arrival. For doctor visits, patients may have 1 support person aged 18 or older with them. For treatment visits, patients cannot have anyone with them due to current Covid guidelines and our immunocompromised population.  °

## 2021-04-14 NOTE — Patient Instructions (Signed)

## 2021-04-14 NOTE — Addendum Note (Signed)
Addended by: Amelia Jo I on: 04/14/2021 12:04 PM   Modules accepted: Orders

## 2021-04-15 ENCOUNTER — Telehealth: Payer: Self-pay

## 2021-04-15 ENCOUNTER — Inpatient Hospital Stay: Payer: PPO

## 2021-04-15 VITALS — BP 121/52 | HR 81 | Temp 98.6°F | Resp 20

## 2021-04-15 DIAGNOSIS — Z5111 Encounter for antineoplastic chemotherapy: Secondary | ICD-10-CM | POA: Diagnosis not present

## 2021-04-15 DIAGNOSIS — D46Z Other myelodysplastic syndromes: Secondary | ICD-10-CM

## 2021-04-15 LAB — BPAM RBC
Blood Product Expiration Date: 202205182359
Blood Product Expiration Date: 202205202359
ISSUE DATE / TIME: 202205021313
ISSUE DATE / TIME: 202205041256
Unit Type and Rh: 6200
Unit Type and Rh: 6200

## 2021-04-15 LAB — TYPE AND SCREEN
ABO/RH(D): A POS
Antibody Screen: NEGATIVE
Unit division: 0
Unit division: 0

## 2021-04-15 MED ORDER — SODIUM CHLORIDE 0.9 % IV SOLN
75.0000 mg/m2 | Freq: Once | INTRAVENOUS | Status: AC
  Administered 2021-04-15: 165 mg via INTRAVENOUS
  Filled 2021-04-15: qty 16.5

## 2021-04-15 MED ORDER — SODIUM CHLORIDE 0.9 % IV SOLN
Freq: Once | INTRAVENOUS | Status: AC
Start: 2021-04-15 — End: 2021-04-15
  Filled 2021-04-15: qty 250

## 2021-04-15 MED ORDER — SODIUM CHLORIDE 0.9 % IV SOLN
10.0000 mg | Freq: Once | INTRAVENOUS | Status: AC
  Administered 2021-04-15: 10 mg via INTRAVENOUS
  Filled 2021-04-15: qty 10

## 2021-04-15 MED ORDER — SODIUM CHLORIDE 0.9% FLUSH
10.0000 mL | INTRAVENOUS | Status: DC | PRN
  Administered 2021-04-15: 10 mL
  Filled 2021-04-15: qty 10

## 2021-04-15 MED ORDER — HEPARIN SOD (PORK) LOCK FLUSH 100 UNIT/ML IV SOLN
500.0000 [IU] | Freq: Once | INTRAVENOUS | Status: AC | PRN
  Administered 2021-04-15: 500 [IU]
  Filled 2021-04-15: qty 5

## 2021-04-15 NOTE — Patient Instructions (Signed)
[facility]  Discharge Instructions: °Thank you for choosing [facility] to provide your oncology and hematology care.  ° °If you have a lab appointment with the Cancer Center, please go directly to the Cancer Center and check in at the registration area. ° °Wear comfortable clothing and clothing appropriate for easy access to any Portacath or PICC line.  ° °We strive to give you quality time with your provider. You may need to reschedule your appointment if you arrive late (15 or more minutes).  Arriving late affects you and other patients whose appointments are after yours.  Also, if you miss three or more appointments without notifying the office, you may be dismissed from the clinic at the provider’s discretion.    °  °For prescription refill requests, have your pharmacy contact our office and allow 72 hours for refills to be completed.   ° °Today you received the following chemotherapy and/or immunotherapy agents Vidaza °  °To help prevent nausea and vomiting after your treatment, we encourage you to take your nausea medication as directed. ° °BELOW ARE SYMPTOMS THAT SHOULD BE REPORTED IMMEDIATELY: °*FEVER GREATER THAN 100.4 F (38 °C) OR HIGHER °*CHILLS OR SWEATING °*NAUSEA AND VOMITING THAT IS NOT CONTROLLED WITH YOUR NAUSEA MEDICATION °*UNUSUAL SHORTNESS OF BREATH °*UNUSUAL BRUISING OR BLEEDING °*URINARY PROBLEMS (pain or burning when urinating, or frequent urination) °*BOWEL PROBLEMS (unusual diarrhea, constipation, pain near the anus) °TENDERNESS IN MOUTH AND THROAT WITH OR WITHOUT PRESENCE OF ULCERS (sore throat, sores in mouth, or a toothache) °UNUSUAL RASH, SWELLING OR PAIN  °UNUSUAL VAGINAL DISCHARGE OR ITCHING  ° °Items with * indicate a potential emergency and should be followed up as soon as possible or go to the Emergency Department if any problems should occur. ° °Please show the CHEMOTHERAPY ALERT CARD or IMMUNOTHERAPY ALERT CARD at check-in to the  Emergency Department and triage nurse. °Should you have questions after your visit or need to cancel or reschedule your appointment, please contact [facility]  [phone] and follow the prompts.  Office hours are 8:00 a.m. to 4:30 p.m. Monday - Friday. Please note that voicemails left after 4:00 p.m. may not be returned until the following business day.  We are closed weekends and major holidays. You have access to a nurse at all times for urgent questions. Please call the main number to the clinic [phone] and follow the prompts. ° °For any non-urgent questions, you may also contact your provider using MyChart. We now offer e-Visits for anyone 18 and older to request care online for non-urgent symptoms. For details visit [email or web address]. °  °Also download the MyChart app! Go to the app store, search "MyChart", open the app, select [facility], and log in with your MyChart username and password. ° °Due to Covid, a mask is required upon entering the hospital/clinic. If you do not have a mask, one will be given to you upon arrival. For doctor visits, patients may have 1 support person aged 18 or older with them. For treatment visits, patients cannot have anyone with them due to current Covid guidelines and our immunocompromised population.  °

## 2021-04-15 NOTE — Telephone Encounter (Signed)
Called pt and left a vm with lab appts per sch message-also that dr e does not need to se him until after bxbm   Theodore Jenkins

## 2021-04-20 ENCOUNTER — Inpatient Hospital Stay: Payer: PPO

## 2021-04-20 ENCOUNTER — Encounter: Payer: Self-pay | Admitting: Hematology & Oncology

## 2021-04-20 ENCOUNTER — Other Ambulatory Visit: Payer: Self-pay | Admitting: *Deleted

## 2021-04-20 ENCOUNTER — Other Ambulatory Visit: Payer: Self-pay

## 2021-04-20 ENCOUNTER — Telehealth: Payer: Self-pay | Admitting: *Deleted

## 2021-04-20 VITALS — BP 94/47 | HR 77 | Temp 99.1°F | Resp 18

## 2021-04-20 DIAGNOSIS — D46Z Other myelodysplastic syndromes: Secondary | ICD-10-CM

## 2021-04-20 DIAGNOSIS — Z5111 Encounter for antineoplastic chemotherapy: Secondary | ICD-10-CM | POA: Diagnosis not present

## 2021-04-20 DIAGNOSIS — D649 Anemia, unspecified: Secondary | ICD-10-CM

## 2021-04-20 LAB — CBC WITH DIFFERENTIAL (CANCER CENTER ONLY)
Abs Immature Granulocytes: 0.2 10*3/uL — ABNORMAL HIGH (ref 0.00–0.07)
Basophils Absolute: 0 10*3/uL (ref 0.0–0.1)
Basophils Relative: 0 %
Blasts: 5 %
Eosinophils Absolute: 0 10*3/uL (ref 0.0–0.5)
Eosinophils Relative: 0 %
HCT: 18.7 % — ABNORMAL LOW (ref 39.0–52.0)
Hemoglobin: 6.5 g/dL — CL (ref 13.0–17.0)
Lymphocytes Relative: 32 %
Lymphs Abs: 0.6 10*3/uL — ABNORMAL LOW (ref 0.7–4.0)
MCH: 31.6 pg (ref 26.0–34.0)
MCHC: 34.8 g/dL (ref 30.0–36.0)
MCV: 90.8 fL (ref 80.0–100.0)
Metamyelocytes Relative: 7 %
Monocytes Absolute: 0.1 10*3/uL (ref 0.1–1.0)
Monocytes Relative: 4 %
Myelocytes: 1 %
Neutro Abs: 1 10*3/uL — ABNORMAL LOW (ref 1.7–7.7)
Neutrophils Relative %: 50 %
Platelet Count: 11 10*3/uL — ABNORMAL LOW (ref 150–400)
Promyelocytes Relative: 1 %
RBC: 2.06 MIL/uL — ABNORMAL LOW (ref 4.22–5.81)
RDW: 15.7 % — ABNORMAL HIGH (ref 11.5–15.5)
WBC Count: 1.9 10*3/uL — ABNORMAL LOW (ref 4.0–10.5)
nRBC: 4.8 % — ABNORMAL HIGH (ref 0.0–0.2)

## 2021-04-20 LAB — BASIC METABOLIC PANEL - CANCER CENTER ONLY
Anion gap: 9 (ref 5–15)
BUN: 35 mg/dL — ABNORMAL HIGH (ref 8–23)
CO2: 22 mmol/L (ref 22–32)
Calcium: 9.3 mg/dL (ref 8.9–10.3)
Chloride: 95 mmol/L — ABNORMAL LOW (ref 98–111)
Creatinine: 1.33 mg/dL — ABNORMAL HIGH (ref 0.61–1.24)
GFR, Estimated: 57 mL/min — ABNORMAL LOW (ref >60)
Glucose, Bld: 225 mg/dL — ABNORMAL HIGH (ref 70–99)
Potassium: 4 mmol/L (ref 3.5–5.1)
Sodium: 126 mmol/L — ABNORMAL LOW (ref 135–145)

## 2021-04-20 LAB — SAMPLE TO BLOOD BANK

## 2021-04-20 LAB — PREPARE RBC (CROSSMATCH)

## 2021-04-20 MED ORDER — SODIUM CHLORIDE 0.9% IV SOLUTION
250.0000 mL | Freq: Once | INTRAVENOUS | Status: AC
  Administered 2021-04-20: 250 mL via INTRAVENOUS
  Filled 2021-04-20: qty 250

## 2021-04-20 MED ORDER — HEPARIN SOD (PORK) LOCK FLUSH 100 UNIT/ML IV SOLN
500.0000 [IU] | Freq: Every day | INTRAVENOUS | Status: AC | PRN
Start: 2021-04-20 — End: 2021-04-20
  Administered 2021-04-20: 500 [IU]
  Filled 2021-04-20: qty 5

## 2021-04-20 MED ORDER — DIPHENHYDRAMINE HCL 25 MG PO CAPS
25.0000 mg | ORAL_CAPSULE | Freq: Once | ORAL | Status: AC
  Administered 2021-04-20: 25 mg via ORAL

## 2021-04-20 MED ORDER — ACETAMINOPHEN 325 MG PO TABS
650.0000 mg | ORAL_TABLET | Freq: Once | ORAL | Status: AC
  Administered 2021-04-20: 650 mg via ORAL

## 2021-04-20 MED ORDER — ACETAMINOPHEN 325 MG PO TABS
ORAL_TABLET | ORAL | Status: AC
  Filled 2021-04-20: qty 2

## 2021-04-20 MED ORDER — SODIUM CHLORIDE 0.9% FLUSH
10.0000 mL | INTRAVENOUS | Status: AC | PRN
Start: 2021-04-20 — End: 2021-04-20
  Administered 2021-04-20: 10 mL
  Filled 2021-04-20: qty 10

## 2021-04-20 MED ORDER — DIPHENHYDRAMINE HCL 25 MG PO CAPS
ORAL_CAPSULE | ORAL | Status: AC
  Filled 2021-04-20: qty 1

## 2021-04-20 NOTE — Telephone Encounter (Signed)
Dr. Marin Olp notified of hgb-6.5 and platelets-11.  Order received for pt to get one unit of platelets and two units of PRBC's per Dr. Marin Olp.

## 2021-04-20 NOTE — Patient Instructions (Signed)

## 2021-04-20 NOTE — Patient Instructions (Signed)
Implanted Port Insertion, Care After This sheet gives you information about how to care for yourself after your procedure. Your health care provider may also give you more specific instructions. If you have problems or questions, contact your health care provider. What can I expect after the procedure? After the procedure, it is common to have:  Discomfort at the port insertion site.  Bruising on the skin over the port. This should improve over 3-4 days. Follow these instructions at home: Port care  After your port is placed, you will get a manufacturer's information card. The card has information about your port. Keep this card with you at all times.  Take care of the port as told by your health care provider. Ask your health care provider if you or a family member can get training for taking care of the port at home. A home health care nurse may also take care of the port.  Make sure to remember what type of port you have. Incision care  Follow instructions from your health care provider about how to take care of your port insertion site. Make sure you: ? Wash your hands with soap and water before and after you change your bandage (dressing). If soap and water are not available, use hand sanitizer. ? Change your dressing as told by your health care provider. ? Leave stitches (sutures), skin glue, or adhesive strips in place. These skin closures may need to stay in place for 2 weeks or longer. If adhesive strip edges start to loosen and curl up, you may trim the loose edges. Do not remove adhesive strips completely unless your health care provider tells you to do that.  Check your port insertion site every day for signs of infection. Check for: ? Redness, swelling, or pain. ? Fluid or blood. ? Warmth. ? Pus or a bad smell.      Activity  Return to your normal activities as told by your health care provider. Ask your health care provider what activities are safe for you.  Do not  lift anything that is heavier than 10 lb (4.5 kg), or the limit that you are told, until your health care provider says that it is safe. General instructions  Take over-the-counter and prescription medicines only as told by your health care provider.  Do not take baths, swim, or use a hot tub until your health care provider approves. Ask your health care provider if you may take showers. You may only be allowed to take sponge baths.  Do not drive for 24 hours if you were given a sedative during your procedure.  Wear a medical alert bracelet in case of an emergency. This will tell any health care providers that you have a port.  Keep all follow-up visits as told by your health care provider. This is important. Contact a health care provider if:  You cannot flush your port with saline as directed, or you cannot draw blood from the port.  You have a fever or chills.  You have redness, swelling, or pain around your port insertion site.  You have fluid or blood coming from your port insertion site.  Your port insertion site feels warm to the touch.  You have pus or a bad smell coming from the port insertion site. Get help right away if:  You have chest pain or shortness of breath.  You have bleeding from your port that you cannot control. Summary  Take care of the port as told by your   health care provider. Keep the manufacturer's information card with you at all times.  Change your dressing as told by your health care provider.  Contact a health care provider if you have a fever or chills or if you have redness, swelling, or pain around your port insertion site.  Keep all follow-up visits as told by your health care provider. This information is not intended to replace advice given to you by your health care provider. Make sure you discuss any questions you have with your health care provider. Document Revised: 06/26/2018 Document Reviewed: 06/26/2018 Elsevier Patient Education   2021 Elsevier Inc.  

## 2021-04-20 NOTE — Progress Notes (Signed)
Wife requesting DME wheeled rolling walker for pt due to weakness.

## 2021-04-21 ENCOUNTER — Inpatient Hospital Stay: Payer: PPO

## 2021-04-21 DIAGNOSIS — D649 Anemia, unspecified: Secondary | ICD-10-CM

## 2021-04-21 DIAGNOSIS — Z5111 Encounter for antineoplastic chemotherapy: Secondary | ICD-10-CM | POA: Diagnosis not present

## 2021-04-21 DIAGNOSIS — D46Z Other myelodysplastic syndromes: Secondary | ICD-10-CM

## 2021-04-21 LAB — BPAM PLATELET PHERESIS
Blood Product Expiration Date: 202205111039
ISSUE DATE / TIME: 202205101041
Unit Type and Rh: 6200

## 2021-04-21 LAB — PREPARE PLATELET PHERESIS: Unit division: 0

## 2021-04-21 MED ORDER — SODIUM CHLORIDE 0.9% IV SOLUTION
250.0000 mL | Freq: Once | INTRAVENOUS | Status: AC
  Administered 2021-04-21: 250 mL via INTRAVENOUS
  Filled 2021-04-21: qty 250

## 2021-04-21 MED ORDER — HEPARIN SOD (PORK) LOCK FLUSH 100 UNIT/ML IV SOLN
500.0000 [IU] | Freq: Once | INTRAVENOUS | Status: AC
  Administered 2021-04-21: 500 [IU] via INTRAVENOUS
  Filled 2021-04-21: qty 5

## 2021-04-21 MED ORDER — SODIUM CHLORIDE 0.9% FLUSH
10.0000 mL | Freq: Once | INTRAVENOUS | Status: AC
Start: 2021-04-21 — End: 2021-04-21
  Administered 2021-04-21: 10 mL via INTRAVENOUS
  Filled 2021-04-21: qty 10

## 2021-04-21 NOTE — Patient Instructions (Signed)

## 2021-04-22 LAB — BPAM RBC
Blood Product Expiration Date: 202205272359
Blood Product Expiration Date: 202205272359
ISSUE DATE / TIME: 202205101144
ISSUE DATE / TIME: 202205110725
Unit Type and Rh: 6200
Unit Type and Rh: 6200

## 2021-04-22 LAB — TYPE AND SCREEN
ABO/RH(D): A POS
Antibody Screen: NEGATIVE
Unit division: 0
Unit division: 0

## 2021-04-23 ENCOUNTER — Other Ambulatory Visit: Payer: Self-pay | Admitting: Radiology

## 2021-04-26 ENCOUNTER — Inpatient Hospital Stay: Payer: PPO

## 2021-04-26 ENCOUNTER — Other Ambulatory Visit: Payer: Self-pay

## 2021-04-26 ENCOUNTER — Other Ambulatory Visit: Payer: Self-pay | Admitting: Family

## 2021-04-26 VITALS — BP 107/36 | HR 82 | Temp 98.8°F | Resp 20

## 2021-04-26 DIAGNOSIS — Z5111 Encounter for antineoplastic chemotherapy: Secondary | ICD-10-CM | POA: Diagnosis not present

## 2021-04-26 DIAGNOSIS — D696 Thrombocytopenia, unspecified: Secondary | ICD-10-CM

## 2021-04-26 DIAGNOSIS — D649 Anemia, unspecified: Secondary | ICD-10-CM

## 2021-04-26 DIAGNOSIS — D46Z Other myelodysplastic syndromes: Secondary | ICD-10-CM

## 2021-04-26 LAB — CBC WITH DIFFERENTIAL (CANCER CENTER ONLY)
Abs Immature Granulocytes: 0.1 10*3/uL — ABNORMAL HIGH (ref 0.00–0.07)
Basophils Absolute: 0 10*3/uL (ref 0.0–0.1)
Basophils Relative: 0 %
Blasts: 2 %
Eosinophils Absolute: 0 10*3/uL (ref 0.0–0.5)
Eosinophils Relative: 0 %
HCT: 15.1 % — ABNORMAL LOW (ref 39.0–52.0)
Hemoglobin: 5.1 g/dL — CL (ref 13.0–17.0)
Lymphocytes Relative: 32 %
Lymphs Abs: 0.4 10*3/uL — ABNORMAL LOW (ref 0.7–4.0)
MCH: 31.9 pg (ref 26.0–34.0)
MCHC: 33.8 g/dL (ref 30.0–36.0)
MCV: 94.4 fL (ref 80.0–100.0)
Metamyelocytes Relative: 6 %
Monocytes Absolute: 0 10*3/uL — ABNORMAL LOW (ref 0.1–1.0)
Monocytes Relative: 2 %
Myelocytes: 2 %
Neutro Abs: 0.7 10*3/uL — ABNORMAL LOW (ref 1.7–7.7)
Neutrophils Relative %: 56 %
Platelet Count: 11 10*3/uL — ABNORMAL LOW (ref 150–400)
RBC: 1.6 MIL/uL — ABNORMAL LOW (ref 4.22–5.81)
RDW: 17.2 % — ABNORMAL HIGH (ref 11.5–15.5)
WBC Count: 1.3 10*3/uL — ABNORMAL LOW (ref 4.0–10.5)
nRBC: 5.6 % — ABNORMAL HIGH (ref 0.0–0.2)

## 2021-04-26 LAB — BASIC METABOLIC PANEL - CANCER CENTER ONLY
Anion gap: 7 (ref 5–15)
BUN: 30 mg/dL — ABNORMAL HIGH (ref 8–23)
CO2: 22 mmol/L (ref 22–32)
Calcium: 8.8 mg/dL — ABNORMAL LOW (ref 8.9–10.3)
Chloride: 95 mmol/L — ABNORMAL LOW (ref 98–111)
Creatinine: 1.37 mg/dL — ABNORMAL HIGH (ref 0.61–1.24)
GFR, Estimated: 55 mL/min — ABNORMAL LOW (ref >60)
Glucose, Bld: 195 mg/dL — ABNORMAL HIGH (ref 70–99)
Potassium: 4.2 mmol/L (ref 3.5–5.1)
Sodium: 124 mmol/L — ABNORMAL LOW (ref 135–145)

## 2021-04-26 LAB — SAMPLE TO BLOOD BANK

## 2021-04-26 LAB — PREPARE RBC (CROSSMATCH)

## 2021-04-26 MED ORDER — HEPARIN SOD (PORK) LOCK FLUSH 100 UNIT/ML IV SOLN
500.0000 [IU] | Freq: Every day | INTRAVENOUS | Status: AC | PRN
  Administered 2021-04-26: 500 [IU]
  Filled 2021-04-26: qty 5

## 2021-04-26 MED ORDER — DIPHENHYDRAMINE HCL 25 MG PO CAPS
25.0000 mg | ORAL_CAPSULE | Freq: Once | ORAL | Status: AC
  Administered 2021-04-26: 25 mg via ORAL

## 2021-04-26 MED ORDER — SODIUM CHLORIDE 0.9% FLUSH
10.0000 mL | INTRAVENOUS | Status: AC | PRN
Start: 2021-04-26 — End: 2021-04-26
  Administered 2021-04-26: 10 mL
  Filled 2021-04-26: qty 10

## 2021-04-26 MED ORDER — SODIUM CHLORIDE 0.9% IV SOLUTION
250.0000 mL | Freq: Once | INTRAVENOUS | Status: AC
  Administered 2021-04-26: 250 mL via INTRAVENOUS
  Filled 2021-04-26: qty 250

## 2021-04-26 MED ORDER — ACETAMINOPHEN 325 MG PO TABS
ORAL_TABLET | ORAL | Status: AC
  Filled 2021-04-26: qty 2

## 2021-04-26 MED ORDER — DIPHENHYDRAMINE HCL 25 MG PO CAPS: 25.0000 mg | ORAL_CAPSULE | Freq: Once | ORAL | Status: DC

## 2021-04-26 MED ORDER — ACETAMINOPHEN 325 MG PO TABS
650.0000 mg | ORAL_TABLET | Freq: Once | ORAL | Status: AC
  Administered 2021-04-26: 650 mg via ORAL

## 2021-04-26 MED ORDER — DIPHENHYDRAMINE HCL 25 MG PO CAPS
ORAL_CAPSULE | ORAL | Status: AC
  Filled 2021-04-26: qty 1

## 2021-04-26 NOTE — Patient Instructions (Signed)

## 2021-04-26 NOTE — Patient Instructions (Signed)

## 2021-04-27 ENCOUNTER — Encounter (HOSPITAL_COMMUNITY): Payer: Self-pay

## 2021-04-27 ENCOUNTER — Ambulatory Visit (HOSPITAL_COMMUNITY)
Admission: RE | Admit: 2021-04-27 | Discharge: 2021-04-27 | Disposition: A | Discharge status: Home / Self Care | Payer: PPO | Source: Ambulatory Visit | Attending: Hematology & Oncology | Admitting: Hematology & Oncology

## 2021-04-27 ENCOUNTER — Inpatient Hospital Stay: Payer: PPO

## 2021-04-27 DIAGNOSIS — Z953 Presence of xenogenic heart valve: Secondary | ICD-10-CM | POA: Insufficient documentation

## 2021-04-27 DIAGNOSIS — D469 Myelodysplastic syndrome, unspecified: Secondary | ICD-10-CM | POA: Diagnosis not present

## 2021-04-27 DIAGNOSIS — D649 Anemia, unspecified: Secondary | ICD-10-CM

## 2021-04-27 DIAGNOSIS — D61818 Other pancytopenia: Secondary | ICD-10-CM | POA: Insufficient documentation

## 2021-04-27 DIAGNOSIS — Z5111 Encounter for antineoplastic chemotherapy: Secondary | ICD-10-CM | POA: Diagnosis not present

## 2021-04-27 DIAGNOSIS — Z951 Presence of aortocoronary bypass graft: Secondary | ICD-10-CM | POA: Insufficient documentation

## 2021-04-27 DIAGNOSIS — Z79899 Other long term (current) drug therapy: Secondary | ICD-10-CM | POA: Diagnosis not present

## 2021-04-27 DIAGNOSIS — D696 Thrombocytopenia, unspecified: Secondary | ICD-10-CM

## 2021-04-27 DIAGNOSIS — Z7984 Long term (current) use of oral hypoglycemic drugs: Secondary | ICD-10-CM | POA: Diagnosis not present

## 2021-04-27 DIAGNOSIS — D46Z Other myelodysplastic syndromes: Secondary | ICD-10-CM

## 2021-04-27 LAB — CBC WITH DIFFERENTIAL/PLATELET
Abs Immature Granulocytes: 0.25 10*3/uL — ABNORMAL HIGH (ref 0.00–0.07)
Basophils Absolute: 0 10*3/uL (ref 0.0–0.1)
Basophils Relative: 0 %
Eosinophils Absolute: 0 10*3/uL (ref 0.0–0.5)
Eosinophils Relative: 0 %
HCT: 18 % — ABNORMAL LOW (ref 39.0–52.0)
Hemoglobin: 5.9 g/dL — CL (ref 13.0–17.0)
Immature Granulocytes: 22 %
Lymphocytes Relative: 30 %
Lymphs Abs: 0.4 10*3/uL — ABNORMAL LOW (ref 0.7–4.0)
MCH: 31.6 pg (ref 26.0–34.0)
MCHC: 32.8 g/dL (ref 30.0–36.0)
MCV: 96.3 fL (ref 80.0–100.0)
Monocytes Absolute: 0.1 10*3/uL (ref 0.1–1.0)
Monocytes Relative: 10 %
Neutro Abs: 0.5 10*3/uL — ABNORMAL LOW (ref 1.7–7.7)
Neutrophils Relative %: 38 %
Platelets: 21 10*3/uL — CL (ref 150–400)
RBC: 1.87 MIL/uL — ABNORMAL LOW (ref 4.22–5.81)
RDW: 17.8 % — ABNORMAL HIGH (ref 11.5–15.5)
WBC Morphology: ABNORMAL
WBC: 1.2 10*3/uL — CL (ref 4.0–10.5)
nRBC: 5.2 % — ABNORMAL HIGH (ref 0.0–0.2)

## 2021-04-27 LAB — PREPARE PLATELET PHERESIS: Unit division: 0

## 2021-04-27 LAB — BPAM PLATELET PHERESIS
Blood Product Expiration Date: 202205192359
ISSUE DATE / TIME: 202205161302
Unit Type and Rh: 5100

## 2021-04-27 LAB — PREPARE RBC (CROSSMATCH)

## 2021-04-27 LAB — GLUCOSE, CAPILLARY: Glucose-Capillary: 162 mg/dL — ABNORMAL HIGH (ref 70–99)

## 2021-04-27 MED ORDER — HEPARIN SOD (PORK) LOCK FLUSH 100 UNIT/ML IV SOLN
500.0000 [IU] | Freq: Every day | INTRAVENOUS | Status: AC | PRN
  Administered 2021-04-27: 500 [IU]
  Filled 2021-04-27: qty 5

## 2021-04-27 MED ORDER — LIDOCAINE HCL 1 % IJ SOLN
INTRAMUSCULAR | Status: AC | PRN
  Administered 2021-04-27: 10 mL via INTRADERMAL

## 2021-04-27 MED ORDER — FUROSEMIDE 10 MG/ML IJ SOLN: 20.0000 mg | Freq: Once | INTRAMUSCULAR | Status: DC

## 2021-04-27 MED ORDER — FENTANYL CITRATE (PF) 100 MCG/2ML IJ SOLN
INTRAMUSCULAR | Status: AC
  Filled 2021-04-27: qty 2

## 2021-04-27 MED ORDER — MIDAZOLAM HCL 2 MG/2ML IJ SOLN
INTRAMUSCULAR | Status: AC | PRN
  Administered 2021-04-27 (×2): 1 mg via INTRAVENOUS

## 2021-04-27 MED ORDER — SODIUM CHLORIDE 0.9 % IV SOLN: INTRAVENOUS | Status: DC

## 2021-04-27 MED ORDER — ACETAMINOPHEN 325 MG PO TABS: 650.0000 mg | ORAL_TABLET | Freq: Once | ORAL | Status: DC

## 2021-04-27 MED ORDER — FENTANYL CITRATE (PF) 100 MCG/2ML IJ SOLN
INTRAMUSCULAR | Status: AC | PRN
  Administered 2021-04-27 (×2): 50 ug via INTRAVENOUS

## 2021-04-27 MED ORDER — NALOXONE HCL 0.4 MG/ML IJ SOLN
INTRAMUSCULAR | Status: AC
  Filled 2021-04-27: qty 1

## 2021-04-27 MED ORDER — FLUMAZENIL 0.5 MG/5ML IV SOLN
INTRAVENOUS | Status: AC
  Filled 2021-04-27: qty 5

## 2021-04-27 MED ORDER — SODIUM CHLORIDE 0.9% FLUSH
10.0000 mL | INTRAVENOUS | Status: AC | PRN
  Administered 2021-04-27: 10 mL
  Filled 2021-04-27: qty 10

## 2021-04-27 MED ORDER — SODIUM CHLORIDE 0.9% IV SOLUTION
250.0000 mL | Freq: Once | INTRAVENOUS | Status: AC
Start: 2021-04-27 — End: 2021-04-27
  Administered 2021-04-27: 250 mL via INTRAVENOUS
  Filled 2021-04-27: qty 250

## 2021-04-27 MED ORDER — MIDAZOLAM HCL 2 MG/2ML IJ SOLN
INTRAMUSCULAR | Status: AC
  Filled 2021-04-27: qty 2

## 2021-04-27 NOTE — Patient Instructions (Signed)

## 2021-04-27 NOTE — Procedures (Signed)
  Procedure: CT bone marrow biopsy R iliac EBL:   minimal Complications:  none immediate  See full dictation in [facility].  Dillard Cannon MD Main # [phone] Pager  [phone] Mobile [phone]

## 2021-04-27 NOTE — H&P (Signed)
Referring Physician(s): Ennever,Peter R  Supervising Physician: Arne Cleveland  Patient Status:  [facility] OP  Chief Complaint: "I'm here for a bone marrow biopsy"   Subjective: Patient familiar to IR service from bone marrow biopsy on 12/15/2020 and Port-A-Cath placement on 12/28/2020.  He has a history of high-grade MDS, status post chemotherapy.  He presents today for follow-up CT-guided bone marrow biopsy to assess treatment response.  He currently denies fever, chest pain, dyspnea, cough, abdominal/back pain, nausea, vomiting or bleeding.  He does have a mild headache. Additional medical history as below. Past Medical History:  Diagnosis Date  . Aortic stenosis    Echo (08/2017): Severe AS with mean gradient of 42 mmHg.  . Barrett's esophagus   . CAD (coronary artery disease) 06/06/2011   Remote stent to the LAD in 2005 // Last cath in 2010 showing diffuse CAD, small PD that is occluded and fills with left to right collaterals, 60-70% 1st OM, with normal EF. Managed medically // Myoview 3/18:  Normal perfusion. LVEF 55% with normal wall motion. This is a low risk study.  . Complication of anesthesia   . Diabetes mellitus   . ED (erectile dysfunction)   . Family history of adverse reaction to anesthesia 10/23/2017   sister had history of malignant hyperthermia when she was 64, she is 73 years old now  . GERD (gastroesophageal reflux disease)   . Goals of care, counseling/discussion 12/22/2020  . Hyperlipidemia   . Hypertension   . IHD (ischemic heart disease)    prior stenting of the LAD in December of 2005; with last cath in 2010 showing diffuse CAD with normal LV function. He is managed medically.   . Malignant hyperthermia    sister had reaction concerning for malignant hyperthermia (~ 1988)  . MDS (myelodysplastic syndrome), high grade ([place]) 12/22/2020  . Neuromuscular disorder ([place]) 10/23/2017   lumbar 4 disc in back causing nerve pain, on gabapentin  . Obesity   . Obstructive  sleep apnea on CPAP   . PONV (postoperative nausea and vomiting)   . S/P aortic valve replacement with bioprosthetic valve 10/26/2017   25 mm [facility] Ease bovine pericardial tissue valve  . S/P coronary artery stent placement 2005   LAD   Past Surgical History:  Procedure Laterality Date  . AORTIC VALVE REPLACEMENT N/A 10/26/2017   Procedure: AORTIC VALVE REPLACEMENT (AVR), using Magna Ease 25;  Surgeon: Rexene Alberts, MD;  Location: [place];  Service: Open Heart Surgery;  Laterality: N/A;  . CARDIAC CATHETERIZATION  2010   NORMAL LV FUNCTION. DIFFUSE CORONARY DISEASE WITH THE DISTAL PORTION OF THE SMALL POSTERIOR DESCENDING VESSEL OCCLUDED AND FILLING WITH THE RIGHT COLLATERALS, 60-70% STENOSIS IN THE FIRST OBTUSE MARGINAL VESSEL AND DIFFUSE SMALL VESSEL DISEASE  . CAROTID ENDARTERECTOMY    . CIRCUMCISION  1985  . CORONARY ARTERY BYPASS GRAFT N/A 10/26/2017   Procedure: CORONARY ARTERY BYPASS GRAFTING (CABG) x two , using left internal mammary artery and right leg greater saphenous vein harvested endoscopically;  Surgeon: Rexene Alberts, MD;  Location: [place];  Service: Open Heart Surgery;  Laterality: N/A;  . CORONARY STENT PLACEMENT  2005  . IR IMAGING GUIDED PORT INSERTION  12/28/2020  . RIGHT/LEFT HEART CATH AND CORONARY ANGIOGRAPHY N/A 08/25/2017   Procedure: RIGHT/LEFT HEART CATH AND CORONARY ANGIOGRAPHY;  Surgeon: Nelva Bush, MD;  Location: [place] [facility];  Service: Cardiovascular;  Laterality: N/A;  . rotator cuff surgery Right   . TEE WITHOUT CARDIOVERSION  N/A 10/26/2017   Procedure: TRANSESOPHAGEAL ECHOCARDIOGRAM (TEE);  Surgeon: Rexene Alberts, MD;  Location: [place];  Service: Open Heart Surgery;  Laterality: N/A;  . TEE WITHOUT CARDIOVERSION N/A 05/01/2020   Procedure: TRANSESOPHAGEAL ECHOCARDIOGRAM (TEE);  Surgeon: Buford Dresser, MD;  Location[facility] ENDOSCOPY;  Service: Cardiovascular;  Laterality: N/A;       Allergies: Oxycodone  Medications: Prior to Admission medications   Medication Sig Start Date End Date Taking? Authorizing Provider  acetaminophen (TYLENOL) 500 MG tablet Take 1,000 mg by mouth every 8 (eight) hours as needed for mild pain (or headaches).   Yes [provider]  amoxicillin (AMOXIL) 500 MG capsule Take 2,000 mg by mouth See admin instructions. Take 2,000 mg by mouth one hour prior to dental appointments   Yes [provider]  ascorbic acid (VITAMIN C) 500 MG tablet Take 500 mg by mouth daily with breakfast.   Yes [provider]  chlorhexidine (PERIDEX) 0.12 % solution Use as directed 15 mLs in the mouth or throat 2 (two) times daily. 01/25/21  Yes Ennever, Rudell Cobb, MD  Cyanocobalamin (VITAMIN B 12) 500 MCG TABS Take 500 mcg by mouth daily.   Yes [provider]  ferrous sulfate 325 (65 FE) MG tablet Take 325 mg by mouth daily with breakfast.   Yes [provider]  fluticasone (FLONASE) 50 MCG/ACT nasal spray Place 2 sprays into both nostrils in the morning.   Yes [provider]  gabapentin (NEURONTIN) 300 MG capsule Take 600 mg by mouth at bedtime.    Yes [provider]  lidocaine-prilocaine (EMLA) cream Apply to affected area once 01/25/21  Yes Ennever, Rudell Cobb, MD  lisinopril (ZESTRIL) 5 MG tablet TAKE 1 TABLET BY MOUTH DAILY Patient taking differently: Take 5 mg by mouth in the morning. 09/02/20  Yes Burnell Blanks, MD  loratadine (CLARITIN) 10 MG tablet Take 10 mg by mouth daily.   Yes [provider]  metoprolol succinate (TOPROL-XL) 50 MG 24 hr tablet Take 50 mg by mouth at bedtime.   Yes [provider]  Multiple Vitamins-Minerals (ONE-A-DAY 50 PLUS PO) Take 1 tablet by mouth daily with breakfast.   Yes [provider]  omeprazole (PRILOSEC) 20 MG capsule Take 20 mg by mouth daily.   Yes [provider]  rosuvastatin (CRESTOR) 10 MG tablet Take 10 mg by  mouth at bedtime.   Yes [provider]  sitaGLIPtin-metformin (JANUMET) 50-1000 MG tablet Take 1 tablet by mouth 2 (two) times daily with a meal.  12/19/16  Yes [provider]  dexamethasone (DECADRON) 4 MG tablet Take 2 tablets (8 mg total) by mouth daily. Start the day after chemotherapy ends for 2 days. Take with food. 01/25/21   Volanda Napoleon, MD  LORazepam (ATIVAN) 0.5 MG tablet Take 1 tablet (0.5 mg total) by mouth every 6 (six) hours as needed (Nausea or vomiting). Patient not taking: No sig reported 01/25/21   Volanda Napoleon, MD  nitroGLYCERIN (NITROSTAT) 0.4 MG SL tablet Place 1 tablet (0.4 mg total) under the tongue every 5 (five) minutes as needed for chest pain. 04/19/19 04/29/20  Burnell Blanks, MD  ondansetron (ZOFRAN) 8 MG tablet Take 1 tablet (8 mg total) by mouth 2 (two) times daily as needed for refractory nausea / vomiting. Start on the third day after last day of chemotherapy. Patient not taking: No sig reported 01/25/21   Volanda Napoleon, MD  [facility] VERIO test strip 1 each by Other  route daily. 12/29/17   [provider]  polyethylene glycol (MIRALAX / GLYCOLAX) 17 g packet Take 17 g by mouth daily. Patient not taking: No sig reported 05/04/20   Donne Hazel, MD  PRILOSEC OTC 20 MG tablet Take 20 mg by mouth daily at 6 PM.    [provider]  prochlorperazine (COMPAZINE) 10 MG tablet Take 1 tablet (10 mg total) by mouth every 6 (six) hours as needed (Nausea or vomiting). Patient not taking: No sig reported 01/25/21   Volanda Napoleon, MD     Vital Signs: BP 130/65   Pulse 84   Temp 97.9 F (36.6 C) (Oral)   Resp (!) 28   SpO2 100%   Physical Exam awake, alert.  Chest clear to auscultation bilaterally.  Clean, intact right chest wall Port-A-Cath.  Heart with regular rate and rhythm, positive murmur.  Abdomen soft, positive bowel sounds, nontender.  No significant lower extremity edema.  Imaging: No results  found.  Labs:  CBC: Recent Labs    04/12/21 0842 04/14/21 1138 04/20/21 0929 04/26/21 1009  WBC 2.8* 3.6* 1.9* 1.3*  HGB 6.6* 7.3* 6.5* 5.1*  HCT 19.6* 21.3* 18.7* 15.1*  PLT 35* 22* 11* 11*    COAGS: Recent Labs    12/10/20 1743 02/19/21 0929  INR 1.1 1.1  APTT  --  34    BMP: Recent Labs    05/01/20 0232 05/02/20 0656 05/04/20 0248 12/09/20 1629 12/10/20 1305 04/12/21 0842 04/14/21 1138 04/20/21 0929 04/26/21 1009  NA 132* 132* 131* 131*   < > 128* 128* 126* 124*  K 4.1 4.0 5.1 4.3   < > 4.0 3.6 4.0 4.2  CL 104 101 95* 97   < > 98 97* 95* 95*  CO2 20* 23 26 20    < > 22 23 22 22   GLUCOSE 118* 115* 121* 89   < > 242* 146* 225* 195*  BUN 13 11 15 24    < > 23 30* 35* 30*  CALCIUM 8.5* 8.9 9.1 9.3   < > 9.3 9.7 9.3 8.8*  CREATININE 0.86 0.94 1.11 1.44*   < > 1.37* 1.31* 1.33* 1.37*  GFRNONAA >60 >60 >60 48*   < > 55* 58* 57* 55*  GFRAA >60 >60 >60 56*  --   --   --   --   --    < > = values in this interval not displayed.    LIVER FUNCTION TESTS: Recent Labs    03/02/21 0845 03/29/21 0902 04/05/21 0911 04/14/21 1138  BILITOT 0.6 0.6 0.4 0.5  AST 21 18 31 19   ALT 12 10 28 15   ALKPHOS 45 42 47 47  PROT 6.9 6.8 7.6 7.0  ALBUMIN 4.3 4.0 4.2 4.0    Assessment and Plan: Patient familiar to IR service from bone marrow biopsy on 12/15/2020 and Port-A-Cath placement on 12/28/2020.  He has a history of high-grade MDS, status post chemotherapy.  He presents today for follow-up CT-guided bone marrow biopsy to assess treatment response. Risks and benefits of procedure was discussed with the patient  including, but not limited to bleeding, infection, damage to adjacent structures or low yield requiring additional tests.  All of the questions were answered and there is agreement to proceed.  Consent signed and in chart.     Electronically Signed: D. Rowe Robert, PA-C 04/27/2021, 9:40 AM   I spent a total of 20 minutes at the the patient's bedside AND on the  patient's hospital  floor or unit, greater than 50% of which was counseling/coordinating care for CT-guided bone marrow biopsy

## 2021-04-27 NOTE — Discharge Instructions (Signed)
Urgent needs - Interventional Radiology on call MD [phone]  Wound - May remove dressing and shower tomorrow.  Keep site clean and dry.  Replace with bandaid as needed.  Do not submerge in tub or water until site healing well. If closed with glue, glue will flake off on its own.   Bone Marrow Aspiration and Bone Marrow Biopsy, Adult, Care After This sheet gives you information about how to care for yourself after your procedure. Your health care provider may also give you more specific instructions. If you have problems or questions, contact your health care provider. What can I expect after the procedure? After the procedure, it is common to have:  Mild pain and tenderness.  Swelling.  Bruising. Follow these instructions at home: Puncture site care  Follow instructions from your health care provider about how to take care of the puncture site. Make sure you: ? Wash your hands with soap and water before and after you change your bandage (dressing). If soap and water are not available, use hand sanitizer. ? Change your dressing as told by your health care provider.  Check your puncture site every day for signs of infection. Check for: ? More redness, swelling, or pain. ? Fluid or blood. ? Warmth. ? Pus or a bad smell.   Activity  Return to your normal activities as told by your health care provider. Ask your health care provider what activities are safe for you.  Do not lift anything that is heavier than 10 lb (4.5 kg), or the limit that you are told, until your health care provider says that it is safe.  Do not drive for 24 hours if you were given a sedative during your procedure. General instructions  Take over-the-counter and prescription medicines only as told by your health care provider.  Do not take baths, swim, or use a hot tub until your health care provider approves. Ask your health care provider if you may take showers. You may only be allowed to take sponge  baths.  If directed, put ice on the affected area. To do this: ? Put ice in a plastic bag. ? Place a towel between your skin and the bag. ? Leave the ice on for 20 minutes, 2-3 times a day.  Keep all follow-up visits as told by your health care provider. This is important.   Contact a health care provider if:  Your pain is not controlled with medicine.  You have a fever.  You have more redness, swelling, or pain around the puncture site.  You have fluid or blood coming from the puncture site.  Your puncture site feels warm to the touch.  You have pus or a bad smell coming from the puncture site. Summary  After the procedure, it is common to have mild pain, tenderness, swelling, and bruising.  Follow instructions from your health care provider about how to take care of the puncture site and what activities are safe for you.  Take over-the-counter and prescription medicines only as told by your health care provider.  Contact a health care provider if you have any signs of infection, such as fluid or blood coming from the puncture site. This information is not intended to replace advice given to you by your health care provider. Make sure you discuss any questions you have with your health care provider. Document Revised: 04/16/2019 Document Reviewed: 04/16/2019 Elsevier Patient Education  2021 [employer].   Moderate Conscious Sedation, Adult, Care After This sheet gives you  information about how to care for yourself after your procedure. Your health care provider may also give you more specific instructions. If you have problems or questions, contact your health care provider. What can I expect after the procedure? After the procedure, it is common to have:  Sleepiness for several hours.  Impaired judgment for several hours.  Difficulty with balance.  Vomiting if you eat too soon. Follow these instructions at home: For the time period you were told by your health care  provider:  Rest.  Do not participate in activities where you could fall or become injured.  Do not drive or use machinery.  Do not drink alcohol.  Do not take sleeping pills or medicines that cause drowsiness.  Do not make important decisions or sign legal documents.  Do not take care of children on your own.      Eating and drinking  Follow the diet recommended by your health care provider.  Drink enough fluid to keep your urine pale yellow.  If you vomit: ? Drink water, juice, or soup when you can drink without vomiting. ? Make sure you have little or no nausea before eating solid foods.   General instructions  Take over-the-counter and prescription medicines only as told by your health care provider.  Have a responsible adult stay with you for the time you are told. It is important to have someone help care for you until you are awake and alert.  Do not smoke.  Keep all follow-up visits as told by your health care provider. This is important. Contact a health care provider if:  You are still sleepy or having trouble with balance after 24 hours.  You feel light-headed.  You keep feeling nauseous or you keep vomiting.  You develop a rash.  You have a fever.  You have redness or swelling around the IV site. Get help right away if:  You have trouble breathing.  You have new-onset confusion at home. Summary  After the procedure, it is common to feel sleepy, have impaired judgment, or feel nauseous if you eat too soon.  Rest after you get home. Know the things you should not do after the procedure.  Follow the diet recommended by your health care provider and drink enough fluid to keep your urine pale yellow.  Get help right away if you have trouble breathing or new-onset confusion at home. This information is not intended to replace advice given to you by your health care provider. Make sure you discuss any questions you have with your health care  provider. Document Revised: 03/27/2020 Document Reviewed: 10/24/2019 Elsevier Patient Education  2021 [employer].

## 2021-04-27 NOTE — Progress Notes (Signed)
Lab called with critical values hgb: 5.9 WBC 1.3 PLT 21;  Called and notified Kim, Therapist, sports of values.  Per Maudie Mercury, she was  currently taking patient to cancer center to receive a blood transfusion.

## 2021-04-28 ENCOUNTER — Encounter: Payer: Self-pay | Admitting: Hematology & Oncology

## 2021-04-28 ENCOUNTER — Telehealth: Payer: Self-pay

## 2021-04-28 LAB — BPAM RBC
Blood Product Expiration Date: 202206042359
Blood Product Expiration Date: 202206042359
ISSUE DATE / TIME: 202205161306
ISSUE DATE / TIME: 202205171253
Unit Type and Rh: 6200
Unit Type and Rh: 6200

## 2021-04-28 LAB — TYPE AND SCREEN
ABO/RH(D): A POS
Antibody Screen: NEGATIVE
Unit division: 0
Unit division: 0

## 2021-04-28 NOTE — Telephone Encounter (Signed)
S/w pts wife and she is aware of the lab appts per 04/28/21 sch message   Theodore Jenkins

## 2021-04-29 ENCOUNTER — Inpatient Hospital Stay: Payer: PPO

## 2021-04-29 ENCOUNTER — Other Ambulatory Visit: Payer: Self-pay | Admitting: Family

## 2021-04-29 ENCOUNTER — Telehealth: Payer: Self-pay | Admitting: *Deleted

## 2021-04-29 ENCOUNTER — Other Ambulatory Visit: Payer: Self-pay

## 2021-04-29 VITALS — BP 121/45 | HR 83 | Temp 98.5°F | Resp 17

## 2021-04-29 DIAGNOSIS — D696 Thrombocytopenia, unspecified: Secondary | ICD-10-CM

## 2021-04-29 DIAGNOSIS — Z5111 Encounter for antineoplastic chemotherapy: Secondary | ICD-10-CM | POA: Diagnosis not present

## 2021-04-29 DIAGNOSIS — D649 Anemia, unspecified: Secondary | ICD-10-CM

## 2021-04-29 DIAGNOSIS — D46Z Other myelodysplastic syndromes: Secondary | ICD-10-CM

## 2021-04-29 LAB — CBC WITH DIFFERENTIAL (CANCER CENTER ONLY)
Abs Immature Granulocytes: 0.19 10*3/uL — ABNORMAL HIGH (ref 0.00–0.07)
Basophils Absolute: 0 10*3/uL (ref 0.0–0.1)
Basophils Relative: 0 %
Eosinophils Absolute: 0 10*3/uL (ref 0.0–0.5)
Eosinophils Relative: 0 %
HCT: 17.8 % — ABNORMAL LOW (ref 39.0–52.0)
Hemoglobin: 5.9 g/dL — CL (ref 13.0–17.0)
Immature Granulocytes: 22 %
Lymphocytes Relative: 37 %
Lymphs Abs: 0.3 10*3/uL — ABNORMAL LOW (ref 0.7–4.0)
MCH: 31.1 pg (ref 26.0–34.0)
MCHC: 33.1 g/dL (ref 30.0–36.0)
MCV: 93.7 fL (ref 80.0–100.0)
Monocytes Absolute: 0.1 10*3/uL (ref 0.1–1.0)
Monocytes Relative: 9 %
Neutro Abs: 0.3 10*3/uL — CL (ref 1.7–7.7)
Neutrophils Relative %: 32 %
Platelet Count: 14 10*3/uL — ABNORMAL LOW (ref 150–400)
RBC: 1.9 MIL/uL — ABNORMAL LOW (ref 4.22–5.81)
RDW: 17.6 % — ABNORMAL HIGH (ref 11.5–15.5)
WBC Count: 0.9 10*3/uL — CL (ref 4.0–10.5)
WBC Morphology: 4
nRBC: 8 % — ABNORMAL HIGH (ref 0.0–0.2)

## 2021-04-29 LAB — BASIC METABOLIC PANEL - CANCER CENTER ONLY
Anion gap: 8 (ref 5–15)
BUN: 24 mg/dL — ABNORMAL HIGH (ref 8–23)
CO2: 22 mmol/L (ref 22–32)
Calcium: 9.2 mg/dL (ref 8.9–10.3)
Chloride: 97 mmol/L — ABNORMAL LOW (ref 98–111)
Creatinine: 1.28 mg/dL — ABNORMAL HIGH (ref 0.61–1.24)
GFR, Estimated: 59 mL/min — ABNORMAL LOW (ref >60)
Glucose, Bld: 195 mg/dL — ABNORMAL HIGH (ref 70–99)
Potassium: 4.2 mmol/L (ref 3.5–5.1)
Sodium: 127 mmol/L — ABNORMAL LOW (ref 135–145)

## 2021-04-29 LAB — SURGICAL PATHOLOGY

## 2021-04-29 LAB — PREPARE RBC (CROSSMATCH)

## 2021-04-29 MED ORDER — SODIUM CHLORIDE 0.9% IV SOLUTION
250.0000 mL | Freq: Once | INTRAVENOUS | Status: DC
Start: 2021-04-29 — End: 2021-04-29
  Filled 2021-04-29: qty 250

## 2021-04-29 MED ORDER — DIPHENHYDRAMINE HCL 25 MG PO CAPS
25.0000 mg | ORAL_CAPSULE | Freq: Once | ORAL | Status: AC
  Administered 2021-04-29: 25 mg via ORAL

## 2021-04-29 MED ORDER — SODIUM CHLORIDE 0.9% FLUSH
10.0000 mL | Freq: Once | INTRAVENOUS | Status: DC
  Filled 2021-04-29: qty 10

## 2021-04-29 MED ORDER — HEPARIN SOD (PORK) LOCK FLUSH 100 UNIT/ML IV SOLN
500.0000 [IU] | Freq: Once | INTRAVENOUS | Status: DC
  Filled 2021-04-29: qty 5

## 2021-04-29 MED ORDER — FUROSEMIDE 10 MG/ML IJ SOLN: 20.0000 mg | Freq: Once | INTRAMUSCULAR | Status: DC

## 2021-04-29 MED ORDER — SODIUM CHLORIDE 0.9% FLUSH
10.0000 mL | INTRAVENOUS | Status: AC | PRN
  Administered 2021-04-29: 10 mL
  Filled 2021-04-29: qty 10

## 2021-04-29 MED ORDER — DIPHENHYDRAMINE HCL 25 MG PO CAPS
ORAL_CAPSULE | ORAL | Status: AC
  Filled 2021-04-29: qty 1

## 2021-04-29 MED ORDER — HEPARIN SOD (PORK) LOCK FLUSH 100 UNIT/ML IV SOLN
500.0000 [IU] | Freq: Every day | INTRAVENOUS | Status: AC | PRN
  Administered 2021-04-29: 500 [IU]
  Filled 2021-04-29: qty 5

## 2021-04-29 MED ORDER — SODIUM CHLORIDE 0.9% IV SOLUTION
250.0000 mL | Freq: Once | INTRAVENOUS | Status: AC
  Administered 2021-04-29: 250 mL via INTRAVENOUS
  Filled 2021-04-29: qty 250

## 2021-04-29 MED ORDER — ACETAMINOPHEN 325 MG PO TABS
650.0000 mg | ORAL_TABLET | Freq: Once | ORAL | Status: AC
  Administered 2021-04-29: 650 mg via ORAL

## 2021-04-29 MED ORDER — ACETAMINOPHEN 325 MG PO TABS
ORAL_TABLET | ORAL | Status: AC
  Filled 2021-04-29: qty 2

## 2021-04-29 NOTE — Patient Instructions (Signed)
Implanted Port Insertion, Care After This sheet gives you information about how to care for yourself after your procedure. Your health care provider may also give you more specific instructions. If you have problems or questions, contact your health care provider. What can I expect after the procedure? After the procedure, it is common to have:  Discomfort at the port insertion site.  Bruising on the skin over the port. This should improve over 3-4 days. Follow these instructions at home: Port care  After your port is placed, you will get a manufacturer's information card. The card has information about your port. Keep this card with you at all times.  Take care of the port as told by your health care provider. Ask your health care provider if you or a family member can get training for taking care of the port at home. A home health care nurse may also take care of the port.  Make sure to remember what type of port you have. Incision care  Follow instructions from your health care provider about how to take care of your port insertion site. Make sure you: ? Wash your hands with soap and water before and after you change your bandage (dressing). If soap and water are not available, use hand sanitizer. ? Change your dressing as told by your health care provider. ? Leave stitches (sutures), skin glue, or adhesive strips in place. These skin closures may need to stay in place for 2 weeks or longer. If adhesive strip edges start to loosen and curl up, you may trim the loose edges. Do not remove adhesive strips completely unless your health care provider tells you to do that.  Check your port insertion site every day for signs of infection. Check for: ? Redness, swelling, or pain. ? Fluid or blood. ? Warmth. ? Pus or a bad smell.      Activity  Return to your normal activities as told by your health care provider. Ask your health care provider what activities are safe for you.  Do not  lift anything that is heavier than 10 lb (4.5 kg), or the limit that you are told, until your health care provider says that it is safe. General instructions  Take over-the-counter and prescription medicines only as told by your health care provider.  Do not take baths, swim, or use a hot tub until your health care provider approves. Ask your health care provider if you may take showers. You may only be allowed to take sponge baths.  Do not drive for 24 hours if you were given a sedative during your procedure.  Wear a medical alert bracelet in case of an emergency. This will tell any health care providers that you have a port.  Keep all follow-up visits as told by your health care provider. This is important. Contact a health care provider if:  You cannot flush your port with saline as directed, or you cannot draw blood from the port.  You have a fever or chills.  You have redness, swelling, or pain around your port insertion site.  You have fluid or blood coming from your port insertion site.  Your port insertion site feels warm to the touch.  You have pus or a bad smell coming from the port insertion site. Get help right away if:  You have chest pain or shortness of breath.  You have bleeding from your port that you cannot control. Summary  Take care of the port as told by your   health care provider. Keep the manufacturer's information card with you at all times.  Change your dressing as told by your health care provider.  Contact a health care provider if you have a fever or chills or if you have redness, swelling, or pain around your port insertion site.  Keep all follow-up visits as told by your health care provider. This information is not intended to replace advice given to you by your health care provider. Make sure you discuss any questions you have with your health care provider. Document Revised: 06/26/2018 Document Reviewed: 06/26/2018 Elsevier Patient Education   2021 Elsevier Inc.  

## 2021-04-29 NOTE — Patient Instructions (Signed)

## 2021-04-29 NOTE — Telephone Encounter (Signed)
Jory Ee NP notified of WBC-0.9, platelet count-14 and ANC-0.3.  Orders received for pt to get one unit of platelets and one unit of PRBC's today and one unit of PRBC's tomorrow.  Message sent to scheduling.

## 2021-04-30 ENCOUNTER — Inpatient Hospital Stay: Payer: PPO

## 2021-04-30 DIAGNOSIS — Z5111 Encounter for antineoplastic chemotherapy: Secondary | ICD-10-CM | POA: Diagnosis not present

## 2021-04-30 DIAGNOSIS — D649 Anemia, unspecified: Secondary | ICD-10-CM

## 2021-04-30 DIAGNOSIS — D46Z Other myelodysplastic syndromes: Secondary | ICD-10-CM

## 2021-04-30 LAB — BPAM PLATELET PHERESIS
Blood Product Expiration Date: 202205192359
ISSUE DATE / TIME: 202205191014
Unit Type and Rh: 8400

## 2021-04-30 LAB — PREPARE PLATELET PHERESIS: Unit division: 0

## 2021-04-30 MED ORDER — HEPARIN SOD (PORK) LOCK FLUSH 100 UNIT/ML IV SOLN
500.0000 [IU] | Freq: Every day | INTRAVENOUS | Status: AC | PRN
  Administered 2021-04-30: 500 [IU]
  Filled 2021-04-30: qty 5

## 2021-04-30 MED ORDER — SODIUM CHLORIDE 0.9% FLUSH
10.0000 mL | INTRAVENOUS | Status: AC | PRN
Start: 2021-04-30 — End: 2021-04-30
  Administered 2021-04-30: 10 mL
  Filled 2021-04-30: qty 10

## 2021-04-30 NOTE — Patient Instructions (Signed)

## 2021-05-03 ENCOUNTER — Inpatient Hospital Stay: Payer: PPO

## 2021-05-03 ENCOUNTER — Other Ambulatory Visit: Payer: Self-pay

## 2021-05-03 ENCOUNTER — Other Ambulatory Visit: Payer: Self-pay | Admitting: *Deleted

## 2021-05-03 ENCOUNTER — Telehealth: Payer: Self-pay | Admitting: *Deleted

## 2021-05-03 VITALS — BP 103/51 | HR 83 | Resp 18

## 2021-05-03 DIAGNOSIS — Z5111 Encounter for antineoplastic chemotherapy: Secondary | ICD-10-CM | POA: Diagnosis not present

## 2021-05-03 DIAGNOSIS — D46Z Other myelodysplastic syndromes: Secondary | ICD-10-CM

## 2021-05-03 LAB — BPAM RBC
Blood Product Expiration Date: 202206102359
Blood Product Expiration Date: 202206102359
ISSUE DATE / TIME: 202205191214
ISSUE DATE / TIME: 202205200758
Unit Type and Rh: 6200
Unit Type and Rh: 6200

## 2021-05-03 LAB — CBC WITH DIFFERENTIAL (CANCER CENTER ONLY)
Abs Immature Granulocytes: 0.09 10*3/uL — ABNORMAL HIGH (ref 0.00–0.07)
Basophils Absolute: 0 10*3/uL (ref 0.0–0.1)
Basophils Relative: 0 %
Eosinophils Absolute: 0 10*3/uL (ref 0.0–0.5)
Eosinophils Relative: 0 %
HCT: 20.2 % — ABNORMAL LOW (ref 39.0–52.0)
Hemoglobin: 6.7 g/dL — CL (ref 13.0–17.0)
Immature Granulocytes: 13 %
Lymphocytes Relative: 53 %
Lymphs Abs: 0.4 10*3/uL — ABNORMAL LOW (ref 0.7–4.0)
MCH: 30.7 pg (ref 26.0–34.0)
MCHC: 33.2 g/dL (ref 30.0–36.0)
MCV: 92.7 fL (ref 80.0–100.0)
Monocytes Absolute: 0.1 10*3/uL (ref 0.1–1.0)
Monocytes Relative: 10 %
Neutro Abs: 0.2 10*3/uL — CL (ref 1.7–7.7)
Neutrophils Relative %: 24 %
Platelet Count: 15 10*3/uL — ABNORMAL LOW (ref 150–400)
RBC: 2.18 MIL/uL — ABNORMAL LOW (ref 4.22–5.81)
RDW: 18.4 % — ABNORMAL HIGH (ref 11.5–15.5)
WBC Count: 0.7 10*3/uL — CL (ref 4.0–10.5)
nRBC: 5.6 % — ABNORMAL HIGH (ref 0.0–0.2)

## 2021-05-03 LAB — TYPE AND SCREEN
ABO/RH(D): A POS
Antibody Screen: NEGATIVE
Unit division: 0
Unit division: 0

## 2021-05-03 LAB — BASIC METABOLIC PANEL - CANCER CENTER ONLY
Anion gap: 8 (ref 5–15)
BUN: 21 mg/dL (ref 8–23)
CO2: 22 mmol/L (ref 22–32)
Calcium: 9.1 mg/dL (ref 8.9–10.3)
Chloride: 98 mmol/L (ref 98–111)
Creatinine: 1.24 mg/dL (ref 0.61–1.24)
GFR, Estimated: >60 mL/min (ref >60)
Glucose, Bld: 213 mg/dL — ABNORMAL HIGH (ref 70–99)
Potassium: 4 mmol/L (ref 3.5–5.1)
Sodium: 128 mmol/L — ABNORMAL LOW (ref 135–145)

## 2021-05-03 MED ORDER — HEPARIN SOD (PORK) LOCK FLUSH 100 UNIT/ML IV SOLN
500.0000 [IU] | Freq: Once | INTRAVENOUS | Status: AC
  Administered 2021-05-03: 500 [IU] via INTRAVENOUS
  Filled 2021-05-03: qty 5

## 2021-05-03 MED ORDER — SODIUM CHLORIDE 0.9% FLUSH
10.0000 mL | Freq: Once | INTRAVENOUS | Status: AC
  Administered 2021-05-03: 10 mL via INTRAVENOUS
  Filled 2021-05-03: qty 10

## 2021-05-03 NOTE — Telephone Encounter (Signed)
Received panic results from [place] in lab.  Hgb 6.7, platlets 15,000, ANC .2.  Dr Marin Olp aware.  No orders received

## 2021-05-03 NOTE — Patient Instructions (Signed)

## 2021-05-04 ENCOUNTER — Encounter: Payer: Self-pay | Admitting: *Deleted

## 2021-05-04 DIAGNOSIS — D469 Myelodysplastic syndrome, unspecified: Secondary | ICD-10-CM | POA: Diagnosis not present

## 2021-05-05 ENCOUNTER — Other Ambulatory Visit: Payer: Self-pay

## 2021-05-05 ENCOUNTER — Inpatient Hospital Stay: Payer: PPO

## 2021-05-05 ENCOUNTER — Other Ambulatory Visit: Payer: Self-pay | Admitting: Family

## 2021-05-05 ENCOUNTER — Ambulatory Visit: Payer: PPO

## 2021-05-05 DIAGNOSIS — Z5111 Encounter for antineoplastic chemotherapy: Secondary | ICD-10-CM | POA: Diagnosis not present

## 2021-05-05 DIAGNOSIS — D649 Anemia, unspecified: Secondary | ICD-10-CM

## 2021-05-05 DIAGNOSIS — D46Z Other myelodysplastic syndromes: Secondary | ICD-10-CM

## 2021-05-05 LAB — BASIC METABOLIC PANEL - CANCER CENTER ONLY
Anion gap: 8 (ref 5–15)
BUN: 24 mg/dL — ABNORMAL HIGH (ref 8–23)
CO2: 22 mmol/L (ref 22–32)
Calcium: 9.1 mg/dL (ref 8.9–10.3)
Chloride: 99 mmol/L (ref 98–111)
Creatinine: 1.3 mg/dL — ABNORMAL HIGH (ref 0.61–1.24)
GFR, Estimated: 58 mL/min — ABNORMAL LOW (ref >60)
Glucose, Bld: 192 mg/dL — ABNORMAL HIGH (ref 70–99)
Potassium: 4.2 mmol/L (ref 3.5–5.1)
Sodium: 129 mmol/L — ABNORMAL LOW (ref 135–145)

## 2021-05-05 LAB — CBC WITH DIFFERENTIAL (CANCER CENTER ONLY)
Abs Immature Granulocytes: 0.1 10*3/uL — ABNORMAL HIGH (ref 0.00–0.07)
Basophils Absolute: 0 10*3/uL (ref 0.0–0.1)
Basophils Relative: 0 %
Eosinophils Absolute: 0 10*3/uL (ref 0.0–0.5)
Eosinophils Relative: 0 %
HCT: 18.9 % — ABNORMAL LOW (ref 39.0–52.0)
Hemoglobin: 6.1 g/dL — CL (ref 13.0–17.0)
Immature Granulocytes: 13 %
Lymphocytes Relative: 53 %
Lymphs Abs: 0.4 10*3/uL — ABNORMAL LOW (ref 0.7–4.0)
MCH: 30.5 pg (ref 26.0–34.0)
MCHC: 32.3 g/dL (ref 30.0–36.0)
MCV: 94.5 fL (ref 80.0–100.0)
Monocytes Absolute: 0.1 10*3/uL (ref 0.1–1.0)
Monocytes Relative: 11 %
Neutro Abs: 0.2 10*3/uL — CL (ref 1.7–7.7)
Neutrophils Relative %: 23 %
Platelet Count: 20 10*3/uL — ABNORMAL LOW (ref 150–400)
RBC: 2 MIL/uL — ABNORMAL LOW (ref 4.22–5.81)
RDW: 19 % — ABNORMAL HIGH (ref 11.5–15.5)
WBC Count: 0.8 10*3/uL — CL (ref 4.0–10.5)
WBC Morphology: 6
nRBC: 7.6 % — ABNORMAL HIGH (ref 0.0–0.2)

## 2021-05-05 LAB — PREPARE RBC (CROSSMATCH)

## 2021-05-05 MED ORDER — HEPARIN SOD (PORK) LOCK FLUSH 100 UNIT/ML IV SOLN
500.0000 [IU] | Freq: Once | INTRAVENOUS | Status: AC
Start: 2021-05-05 — End: 2021-05-05
  Administered 2021-05-05: 500 [IU] via INTRAVENOUS
  Filled 2021-05-05: qty 5

## 2021-05-05 MED ORDER — SODIUM CHLORIDE 0.9% FLUSH
10.0000 mL | Freq: Once | INTRAVENOUS | Status: AC
  Administered 2021-05-05: 10 mL via INTRAVENOUS
  Filled 2021-05-05: qty 10

## 2021-05-05 NOTE — Patient Instructions (Signed)
Tunneled Central Venous Catheter Flushing Guide  It is important to flush your tunneled central venous catheter each time you use it, both before and after you use it. Flushing your catheter will help prevent it from clogging. What are the risks? Risks may include:  Infection.  Air getting into the catheter and bloodstream. Supplies needed:  A clean pair of gloves.  A disinfecting wipe. Use an alcohol wipe, chlorhexidine wipe, or iodine wipe as told by your health care provider.  A 10 mL syringe that has been prefilled with saline solution.  An empty 10 mL syringe, if a substance called heparin was injected into your catheter. How to flush your catheter When you flush your catheter, make sure you follow any specific instructions from your health care provider or the manufacturer. These are general guidelines. Flushing your catheter before use If there is heparin in your catheter: 1. Wash your hands with soap and water. 2. Put on gloves. 3. Scrub the injection cap for a minimum of 15 seconds with a disinfecting wipe. 4. Unclamp the catheter. 5. Attach the empty syringe to the injection cap. 6. Pull the syringe plunger back and withdraw 10 mL of blood. 7. Place the syringe into an appropriate waste container. 8. Scrub the injection cap for 15 seconds with a disinfecting wipe. 9. Attach the prefilled syringe to the injection cap. 10. Flush the catheter by pushing the plunger forward until all the liquid from the syringe is in the catheter. 11. Remove the syringe from the injection cap. 12. Clamp the catheter. If there is no heparin in your catheter: 1. Wash your hands with soap and water. 2. Put on gloves. 3. Scrub the injection cap for 15 seconds with a disinfecting wipe. 4. Unclamp the catheter. 5. Attach the prefilled syringe to the injection cap. 6. Flush the catheter by pushing the plunger forward until 5 mL of the liquid from the syringe is in the catheter. 7. Pull back on  the syringe until you see blood in the catheter. 8. If you have been asked to collect any blood, follow your health care provider's instructions. Otherwise, flush the catheter with the rest of the solution from the syringe. 9. Remove the syringe from the injection cap. 10. Clamp the catheter.   Flushing your catheter after use 1. Wash your hands with soap and water. 2. Put on gloves. 3. Scrub the injection cap for 15 seconds with a disinfecting wipe. 4. Unclamp the catheter. 5. Attach the prefilled syringe to the injection cap. 6. Flush the catheter by pushing the plunger forward until all of the liquid from the syringe is in the catheter. 7. Remove the syringe from the injection cap. 8. Clamp the catheter. Problems and solutions  If blood cannot be completely cleared from the injection cap, you may need to have the injection cap replaced.  If the catheter is difficult to flush, use the pulsing method. The pulsing method involves pushing only a few milliliters of solution into the catheter at a time and pausing between pushes.  If you do not see blood in the catheter when you pull back on the syringe, change your body position, such as by raising your arms above your head. Take a deep breath and cough. Then, pull back on the syringe. If you still do not see blood, flush the catheter with a small amount of solution. Then, change positions again and take a breath or cough. Pull back on the syringe again. If you still do not   see blood, finish flushing the catheter and contact your health care provider. Do not use your catheter until your health care provider says it is okay. General tips  Have someone help you flush your catheter, if possible.  Do not force fluid through your catheter.  Do not use a syringe that is larger or smaller than 10 mL. Using a smaller syringe can make the catheter burst.  Do not use your catheter without flushing it first if it has heparin in it. Contact a health  care provider if:  You cannot see any blood in the catheter when you flush it before using it.  Your catheter is difficult to flush. Get help right away if:  You cannot flush the catheter.  The catheter leaks when you flush it or when there is fluid in it.  There are cracks or breaks in the catheter. Summary  It is important to flush your tunneled central venous catheter each time you use it, both before and after you use it.  Scrub the injection cap for 15 seconds with a disinfecting wipe before and after you flush it.  When you flush your catheter, make sure you follow any specific instructions from your health care provider or the manufacturer.  Get help right away if you cannot flush the catheter. This information is not intended to replace advice given to you by your health care provider. Make sure you discuss any questions you have with your health care provider. Document Revised: 02/06/2020 Document Reviewed: 02/13/2019 Elsevier Patient Education  2021 Elsevier Inc.  

## 2021-05-06 ENCOUNTER — Inpatient Hospital Stay: Payer: PPO

## 2021-05-06 DIAGNOSIS — D46Z Other myelodysplastic syndromes: Secondary | ICD-10-CM

## 2021-05-06 DIAGNOSIS — Z5111 Encounter for antineoplastic chemotherapy: Secondary | ICD-10-CM | POA: Diagnosis not present

## 2021-05-06 DIAGNOSIS — D649 Anemia, unspecified: Secondary | ICD-10-CM

## 2021-05-06 MED ORDER — SODIUM CHLORIDE 0.9% FLUSH
10.0000 mL | INTRAVENOUS | Status: AC | PRN
  Administered 2021-05-06: 10 mL
  Filled 2021-05-06: qty 10

## 2021-05-06 MED ORDER — ACETAMINOPHEN 325 MG PO TABS: 650.0000 mg | ORAL_TABLET | Freq: Once | ORAL | Status: DC

## 2021-05-06 MED ORDER — HEPARIN SOD (PORK) LOCK FLUSH 100 UNIT/ML IV SOLN
500.0000 [IU] | Freq: Every day | INTRAVENOUS | Status: AC | PRN
  Administered 2021-05-06: 500 [IU]
  Filled 2021-05-06: qty 5

## 2021-05-06 MED ORDER — FUROSEMIDE 10 MG/ML IJ SOLN: 20.0000 mg | Freq: Once | INTRAMUSCULAR | Status: DC

## 2021-05-06 MED ORDER — DIPHENHYDRAMINE HCL 25 MG PO CAPS
25.0000 mg | ORAL_CAPSULE | Freq: Once | ORAL | Status: DC
Start: 2021-05-06 — End: 2021-05-06

## 2021-05-06 MED ORDER — SODIUM CHLORIDE 0.9% IV SOLUTION
250.0000 mL | Freq: Once | INTRAVENOUS | Status: AC
  Administered 2021-05-06: 250 mL via INTRAVENOUS
  Filled 2021-05-06: qty 250

## 2021-05-06 NOTE — Patient Instructions (Signed)

## 2021-05-07 ENCOUNTER — Encounter (HOSPITAL_COMMUNITY): Payer: Self-pay

## 2021-05-07 DIAGNOSIS — D469 Myelodysplastic syndrome, unspecified: Secondary | ICD-10-CM | POA: Diagnosis not present

## 2021-05-07 LAB — TYPE AND SCREEN
ABO/RH(D): A POS
Antibody Screen: NEGATIVE
Unit division: 0

## 2021-05-07 LAB — BPAM RBC
Blood Product Expiration Date: 202206242359
ISSUE DATE / TIME: 202205260742
Unit Type and Rh: 6200

## 2021-05-11 ENCOUNTER — Inpatient Hospital Stay: Payer: PPO | Admitting: Hematology & Oncology

## 2021-05-11 ENCOUNTER — Inpatient Hospital Stay: Payer: PPO

## 2021-05-11 ENCOUNTER — Telehealth: Payer: Self-pay | Admitting: *Deleted

## 2021-05-11 ENCOUNTER — Inpatient Hospital Stay (HOSPITAL_BASED_OUTPATIENT_CLINIC_OR_DEPARTMENT_OTHER): Payer: PPO | Admitting: Hematology & Oncology

## 2021-05-11 ENCOUNTER — Other Ambulatory Visit: Payer: Self-pay

## 2021-05-11 ENCOUNTER — Ambulatory Visit: Payer: PPO

## 2021-05-11 VITALS — BP 108/52 | HR 79 | Temp 98.7°F | Resp 18 | Wt 219.0 lb

## 2021-05-11 DIAGNOSIS — Z5111 Encounter for antineoplastic chemotherapy: Secondary | ICD-10-CM | POA: Diagnosis not present

## 2021-05-11 DIAGNOSIS — D46Z Other myelodysplastic syndromes: Secondary | ICD-10-CM

## 2021-05-11 LAB — CBC WITH DIFFERENTIAL (CANCER CENTER ONLY)
Abs Immature Granulocytes: 0.1 10*3/uL — ABNORMAL HIGH (ref 0.00–0.07)
Basophils Absolute: 0 10*3/uL (ref 0.0–0.1)
Basophils Relative: 0 %
Blasts: 8 %
Eosinophils Absolute: 0 10*3/uL (ref 0.0–0.5)
Eosinophils Relative: 0 %
HCT: 19.6 % — ABNORMAL LOW (ref 39.0–52.0)
Hemoglobin: 6.3 g/dL — CL (ref 13.0–17.0)
Lymphocytes Relative: 58 %
Lymphs Abs: 0.5 10*3/uL — ABNORMAL LOW (ref 0.7–4.0)
MCH: 30.6 pg (ref 26.0–34.0)
MCHC: 32.1 g/dL (ref 30.0–36.0)
MCV: 95.1 fL (ref 80.0–100.0)
Metamyelocytes Relative: 2 %
Monocytes Absolute: 0.1 10*3/uL (ref 0.1–1.0)
Monocytes Relative: 12 %
Neutro Abs: 0.1 10*3/uL — CL (ref 1.7–7.7)
Neutrophils Relative %: 16 %
Platelet Count: 34 10*3/uL — ABNORMAL LOW (ref 150–400)
Promyelocytes Relative: 4 %
RBC: 2.06 MIL/uL — ABNORMAL LOW (ref 4.22–5.81)
RDW: 20.9 % — ABNORMAL HIGH (ref 11.5–15.5)
WBC Count: 0.9 10*3/uL — CL (ref 4.0–10.5)
nRBC: 5.7 % — ABNORMAL HIGH (ref 0.0–0.2)

## 2021-05-11 LAB — BASIC METABOLIC PANEL - CANCER CENTER ONLY
Anion gap: 10 (ref 5–15)
BUN: 19 mg/dL (ref 8–23)
CO2: 21 mmol/L — ABNORMAL LOW (ref 22–32)
Calcium: 9.4 mg/dL (ref 8.9–10.3)
Chloride: 99 mmol/L (ref 98–111)
Creatinine: 1.21 mg/dL (ref 0.61–1.24)
GFR, Estimated: >60 mL/min (ref >60)
Glucose, Bld: 176 mg/dL — ABNORMAL HIGH (ref 70–99)
Potassium: 4.1 mmol/L (ref 3.5–5.1)
Sodium: 130 mmol/L — ABNORMAL LOW (ref 135–145)

## 2021-05-11 MED ORDER — HEPARIN SOD (PORK) LOCK FLUSH 100 UNIT/ML IV SOLN
500.0000 [IU] | Freq: Once | INTRAVENOUS | Status: AC | PRN
  Administered 2021-05-11: 500 [IU]
  Filled 2021-05-11: qty 5

## 2021-05-11 MED ORDER — SODIUM CHLORIDE 0.9 % IV SOLN
75.0000 mg/m2 | Freq: Once | INTRAVENOUS | Status: AC
  Administered 2021-05-11: 165 mg via INTRAVENOUS
  Filled 2021-05-11: qty 16.5

## 2021-05-11 MED ORDER — SODIUM CHLORIDE 0.9 % IV SOLN
10.0000 mg | Freq: Once | INTRAVENOUS | Status: AC
  Administered 2021-05-11: 10 mg via INTRAVENOUS
  Filled 2021-05-11: qty 10

## 2021-05-11 MED ORDER — SODIUM CHLORIDE 0.9% FLUSH
10.0000 mL | INTRAVENOUS | Status: DC | PRN
  Administered 2021-05-11: 10 mL
  Filled 2021-05-11: qty 10

## 2021-05-11 MED ORDER — SODIUM CHLORIDE 0.9 % IV SOLN
Freq: Once | INTRAVENOUS | Status: AC
  Filled 2021-05-11: qty 250

## 2021-05-11 MED ORDER — PALONOSETRON HCL INJECTION 0.25 MG/5ML
INTRAVENOUS | Status: AC
  Filled 2021-05-11: qty 5

## 2021-05-11 MED ORDER — PALONOSETRON HCL INJECTION 0.25 MG/5ML
0.2500 mg | Freq: Once | INTRAVENOUS | Status: AC
  Administered 2021-05-11: 0.25 mg via INTRAVENOUS

## 2021-05-11 NOTE — Progress Notes (Signed)
Ok to treat despite labs. Ok to treat without ANC resulted. Per Dr. Marin Olp.

## 2021-05-11 NOTE — Progress Notes (Signed)
Hematology and Oncology Follow Up Visit  Theodore Jenkins [id number] January 18, 1946 75 y.o. 05/11/2021   Principle Diagnosis:   MDS -- RAEB-2  --  Normal cytogenetics;  IPSS = 5  Current Therapy:    Vidaza 75 mg/m2 IV q day d1-7  -- s/p cycle #2- start on 01/25/2021  Vidaza 400 mg po q day -- start on 05/19/2021     Interim History:  Theodore Jenkins is in for follow-up.  We did go ahead and get a bone marrow biopsy on him.  This was done on 04/27/2021.  The pathology report ([id number]) showed continued myelodysplastic syndrome with excess blasts.  Everything looked relatively stable.  There were no abnormal cytogenetics.  At this point, I think that we we will add venetoclax.  Hopefully, we will be able to see a response with venetoclax.  Thankfully, he has had no problems with nosebleeds.  He is eating okay.  He has had no nausea or vomiting.  Has been no change in bowel or bladder habits.  He has had no mouth sores.  His heart is doing okay.  He did have endocarditis back in May 2021.  He was treated for this.  He has had no fever.  Has had some bruising.  He has had little bit of leg swelling.  Overall, his performance status is ECOG 1.     Medications:  Current Outpatient Medications:  .  acetaminophen (TYLENOL) 500 MG tablet, Take 1,000 mg by mouth every 8 (eight) hours as needed for mild pain (or headaches)., Disp: , Rfl:  .  amoxicillin (AMOXIL) 500 MG capsule, Take 2,000 mg by mouth See admin instructions. Take 2,000 mg by mouth one hour prior to dental appointments, Disp: , Rfl:  .  chlorhexidine (PERIDEX) 0.12 % solution, Use as directed 15 mLs in the mouth or throat 2 (two) times daily., Disp: 450 mL, Rfl: 3 .  dexamethasone (DECADRON) 4 MG tablet, Take 2 tablets (8 mg total) by mouth daily. Start the day after chemotherapy ends for 2 days. Take with food., Disp: 30 tablet, Rfl: 1 .  fluticasone (FLONASE) 50 MCG/ACT nasal spray, Place 2 sprays into both nostrils in the  morning., Disp: , Rfl:  .  gabapentin (NEURONTIN) 300 MG capsule, Take 600 mg by mouth at bedtime. , Disp: , Rfl:  .  lidocaine-prilocaine (EMLA) cream, Apply to affected area once, Disp: 30 g, Rfl: 3 .  lisinopril (ZESTRIL) 5 MG tablet, TAKE 1 TABLET BY MOUTH DAILY (Patient taking differently: Take 5 mg by mouth in the morning.), Disp: 90 tablet, Rfl: 2 .  loratadine (CLARITIN) 10 MG tablet, Take 10 mg by mouth daily., Disp: , Rfl:  .  LORazepam (ATIVAN) 0.5 MG tablet, Take 1 tablet (0.5 mg total) by mouth every 6 (six) hours as needed (Nausea or vomiting). (Patient not taking: No sig reported), Disp: 30 tablet, Rfl: 0 .  metoprolol succinate (TOPROL-XL) 50 MG 24 hr tablet, Take 50 mg by mouth at bedtime., Disp: , Rfl:  .  Multiple Vitamins-Minerals (ONE-A-DAY 50 PLUS PO), Take 1 tablet by mouth daily with breakfast., Disp: , Rfl:  .  nitroGLYCERIN (NITROSTAT) 0.4 MG SL tablet, Place 1 tablet (0.4 mg total) under the tongue every 5 (five) minutes as needed for chest pain., Disp: 25 tablet, Rfl: 6 .  omeprazole (PRILOSEC) 20 MG capsule, Take 20 mg by mouth daily., Disp: , Rfl:  .  ondansetron (ZOFRAN) 8 MG tablet, Take 1 tablet (8 mg total) by mouth 2 (  two) times daily as needed for refractory nausea / vomiting. Start on the third day after last day of chemotherapy. (Patient not taking: No sig reported), Disp: 30 tablet, Rfl: 1 .  ONETOUCH VERIO test strip, 1 each by Other route daily., Disp: , Rfl: 3 .  polyethylene glycol (MIRALAX / GLYCOLAX) 17 g packet, Take 17 g by mouth daily. (Patient not taking: No sig reported), Disp: 14 each, Rfl: 0 .  PRILOSEC OTC 20 MG tablet, Take 20 mg by mouth daily at 6 PM., Disp: , Rfl:  .  prochlorperazine (COMPAZINE) 10 MG tablet, Take 1 tablet (10 mg total) by mouth every 6 (six) hours as needed (Nausea or vomiting). (Patient not taking: No sig reported), Disp: 30 tablet, Rfl: 1 .  rosuvastatin (CRESTOR) 10 MG tablet, Take 10 mg by mouth at bedtime., Disp: , Rfl:   .  sitaGLIPtin-metformin (JANUMET) 50-1000 MG tablet, Take 1 tablet by mouth 2 (two) times daily with a meal. , Disp: , Rfl:  No current facility-administered medications for this visit.  Facility-Administered Medications Ordered in Other Visits:  .  sodium chloride flush (NS) 0.9 % injection 10 mL, 10 mL, Intracatheter, PRN, Volanda Napoleon, MD, 10 mL at 04/14/21 1614  Allergies:  Allergies  Allergen Reactions  . Oxycodone Nausea And Vomiting    Past Medical History, Surgical history, Social history, and Family History were reviewed and updated.  Review of Systems: Review of Systems  Constitutional: Negative.   HENT:  Negative.   Eyes: Negative.   Respiratory: Negative.   Cardiovascular: Negative.   Gastrointestinal: Negative.   Endocrine: Negative.   Genitourinary: Negative.    Musculoskeletal: Negative.   Skin: Positive for rash.  Neurological: Negative.   Hematological: Bruises/bleeds easily.  Psychiatric/Behavioral: Negative.     Physical Exam:  weight is 219 lb (99.3 kg). His oral temperature is 98.7 F (37.1 C). His blood pressure is 108/52 (abnormal) and his pulse is 79. His respiration is 18 and oxygen saturation is 100%.   Wt Readings from Last 3 Encounters:  05/11/21 219 lb (99.3 kg)  04/05/21 213 lb (96.6 kg)  03/29/21 216 lb (98 kg)    Physical Exam Vitals reviewed.  HENT:     Head: Normocephalic and atraumatic.  Eyes:     Pupils: Pupils are equal, round, and reactive to light.  Cardiovascular:     Rate and Rhythm: Normal rate and regular rhythm.     Heart sounds: Normal heart sounds.  Pulmonary:     Effort: Pulmonary effort is normal.     Breath sounds: Normal breath sounds.  Abdominal:     General: Bowel sounds are normal.     Palpations: Abdomen is soft.  Musculoskeletal:        General: No tenderness or deformity. Normal range of motion.     Cervical back: Normal range of motion.  Lymphadenopathy:     Cervical: No cervical adenopathy.   Skin:    General: Skin is warm and dry.     Findings: No erythema or rash.     Comments: Skin exam does show some scattered ecchymoses.  I do not see any obvious petechia on his legs.  He has no rashes.  Neurological:     Mental Status: He is alert and oriented to person, place, and time.  Psychiatric:        Behavior: Behavior normal.        Thought Content: Thought content normal.        Judgment:  Judgment normal.      Lab Results  Component Value Date   WBC 0.8 (LL) 05/05/2021   HGB 6.1 (LL) 05/05/2021   HCT 18.9 (L) 05/05/2021   MCV 94.5 05/05/2021   PLT 20 (L) 05/05/2021     Chemistry      Component Value Date/Time   NA 129 (L) 05/05/2021 0947   NA 131 (L) 12/09/2020 1629   K 4.2 05/05/2021 0947   CL 99 05/05/2021 0947   CO2 22 05/05/2021 0947   BUN 24 (H) 05/05/2021 0947   BUN 24 12/09/2020 1629   CREATININE 1.30 (H) 05/05/2021 0947      Component Value Date/Time   CALCIUM 9.1 05/05/2021 0947   ALKPHOS 47 04/14/2021 1138   AST 19 04/14/2021 1138   ALT 15 04/14/2021 1138   BILITOT 0.5 04/14/2021 1138      Impression and Plan: Mr. Haberer is a very nice 75 year old white male.  He has high-grade myelodysplasia.  Bone marrow biopsy still shows that he has the myelodysplastic syndrome with excess blasts.  He certainly has not progressed.  He has not had a good response like I thought he would have at this point.  Again, we will go ahead and add venetoclax.  We will start at 100 mg a day and try to move up to 400 mg a day.  I do not see any problems with this.  I do not think he will wanted problems with tumor lysis.  He will go ahead with his fourth cycle of Vidaza now.  I know that he is trying his best.  He is doing all that we have asked him to do.  He has great support from his family.  Provided so that we can get his blood counts little bit better so we do not have to transfuse him as much.  I think he comes in weekly for lab work.  I will plan to see  him back myself in another  4 weeks.     Volanda Napoleon, MD 5/31/20228:33 AM

## 2021-05-11 NOTE — Telephone Encounter (Signed)
Dr. Marin Olp notified of HGB-6.3, WBC-0.9 and ANC-0.1.  No new orders received at this time.

## 2021-05-11 NOTE — Patient Instructions (Signed)
Tunneled Central Venous Catheter Flushing Guide  It is important to flush your tunneled central venous catheter each time you use it, both before and after you use it. Flushing your catheter will help prevent it from clogging. What are the risks? Risks may include:  Infection.  Air getting into the catheter and bloodstream. Supplies needed:  A clean pair of gloves.  A disinfecting wipe. Use an alcohol wipe, chlorhexidine wipe, or iodine wipe as told by your health care provider.  A 10 mL syringe that has been prefilled with saline solution.  An empty 10 mL syringe, if a substance called heparin was injected into your catheter. How to flush your catheter When you flush your catheter, make sure you follow any specific instructions from your health care provider or the manufacturer. These are general guidelines. Flushing your catheter before use If there is heparin in your catheter: 1. Wash your hands with soap and water. 2. Put on gloves. 3. Scrub the injection cap for a minimum of 15 seconds with a disinfecting wipe. 4. Unclamp the catheter. 5. Attach the empty syringe to the injection cap. 6. Pull the syringe plunger back and withdraw 10 mL of blood. 7. Place the syringe into an appropriate waste container. 8. Scrub the injection cap for 15 seconds with a disinfecting wipe. 9. Attach the prefilled syringe to the injection cap. 10. Flush the catheter by pushing the plunger forward until all the liquid from the syringe is in the catheter. 11. Remove the syringe from the injection cap. 12. Clamp the catheter. If there is no heparin in your catheter: 1. Wash your hands with soap and water. 2. Put on gloves. 3. Scrub the injection cap for 15 seconds with a disinfecting wipe. 4. Unclamp the catheter. 5. Attach the prefilled syringe to the injection cap. 6. Flush the catheter by pushing the plunger forward until 5 mL of the liquid from the syringe is in the catheter. 7. Pull back on  the syringe until you see blood in the catheter. 8. If you have been asked to collect any blood, follow your health care provider's instructions. Otherwise, flush the catheter with the rest of the solution from the syringe. 9. Remove the syringe from the injection cap. 10. Clamp the catheter.   Flushing your catheter after use 1. Wash your hands with soap and water. 2. Put on gloves. 3. Scrub the injection cap for 15 seconds with a disinfecting wipe. 4. Unclamp the catheter. 5. Attach the prefilled syringe to the injection cap. 6. Flush the catheter by pushing the plunger forward until all of the liquid from the syringe is in the catheter. 7. Remove the syringe from the injection cap. 8. Clamp the catheter. Problems and solutions  If blood cannot be completely cleared from the injection cap, you may need to have the injection cap replaced.  If the catheter is difficult to flush, use the pulsing method. The pulsing method involves pushing only a few milliliters of solution into the catheter at a time and pausing between pushes.  If you do not see blood in the catheter when you pull back on the syringe, change your body position, such as by raising your arms above your head. Take a deep breath and cough. Then, pull back on the syringe. If you still do not see blood, flush the catheter with a small amount of solution. Then, change positions again and take a breath or cough. Pull back on the syringe again. If you still do not   see blood, finish flushing the catheter and contact your health care provider. Do not use your catheter until your health care provider says it is okay. General tips  Have someone help you flush your catheter, if possible.  Do not force fluid through your catheter.  Do not use a syringe that is larger or smaller than 10 mL. Using a smaller syringe can make the catheter burst.  Do not use your catheter without flushing it first if it has heparin in it. Contact a health  care provider if:  You cannot see any blood in the catheter when you flush it before using it.  Your catheter is difficult to flush. Get help right away if:  You cannot flush the catheter.  The catheter leaks when you flush it or when there is fluid in it.  There are cracks or breaks in the catheter. Summary  It is important to flush your tunneled central venous catheter each time you use it, both before and after you use it.  Scrub the injection cap for 15 seconds with a disinfecting wipe before and after you flush it.  When you flush your catheter, make sure you follow any specific instructions from your health care provider or the manufacturer.  Get help right away if you cannot flush the catheter. This information is not intended to replace advice given to you by your health care provider. Make sure you discuss any questions you have with your health care provider. Document Revised: 02/06/2020 Document Reviewed: 02/13/2019 Elsevier Patient Education  2021 Elsevier Inc.  

## 2021-05-12 ENCOUNTER — Inpatient Hospital Stay: Payer: PPO | Attending: Hematology & Oncology

## 2021-05-12 VITALS — BP 145/52 | HR 88 | Temp 98.2°F | Resp 16

## 2021-05-12 DIAGNOSIS — Z5111 Encounter for antineoplastic chemotherapy: Secondary | ICD-10-CM | POA: Diagnosis not present

## 2021-05-12 DIAGNOSIS — Z79899 Other long term (current) drug therapy: Secondary | ICD-10-CM | POA: Diagnosis not present

## 2021-05-12 DIAGNOSIS — Z885 Allergy status to narcotic agent status: Secondary | ICD-10-CM | POA: Insufficient documentation

## 2021-05-12 DIAGNOSIS — R21 Rash and other nonspecific skin eruption: Secondary | ICD-10-CM | POA: Insufficient documentation

## 2021-05-12 DIAGNOSIS — D469 Myelodysplastic syndrome, unspecified: Secondary | ICD-10-CM | POA: Diagnosis not present

## 2021-05-12 DIAGNOSIS — M542 Cervicalgia: Secondary | ICD-10-CM | POA: Insufficient documentation

## 2021-05-12 DIAGNOSIS — D709 Neutropenia, unspecified: Secondary | ICD-10-CM | POA: Insufficient documentation

## 2021-05-12 DIAGNOSIS — C9200 Acute myeloblastic leukemia, not having achieved remission: Secondary | ICD-10-CM | POA: Diagnosis not present

## 2021-05-12 DIAGNOSIS — R197 Diarrhea, unspecified: Secondary | ICD-10-CM | POA: Diagnosis not present

## 2021-05-12 DIAGNOSIS — D46Z Other myelodysplastic syndromes: Secondary | ICD-10-CM

## 2021-05-12 MED ORDER — SODIUM CHLORIDE 0.9 % IV SOLN
Freq: Once | INTRAVENOUS | Status: AC
  Filled 2021-05-12: qty 250

## 2021-05-12 MED ORDER — SODIUM CHLORIDE 0.9 % IV SOLN
10.0000 mg | Freq: Once | INTRAVENOUS | Status: AC
  Administered 2021-05-12: 10 mg via INTRAVENOUS
  Filled 2021-05-12: qty 10

## 2021-05-12 MED ORDER — SODIUM CHLORIDE 0.9% FLUSH
10.0000 mL | INTRAVENOUS | Status: DC | PRN
  Administered 2021-05-12: 10 mL
  Filled 2021-05-12: qty 10

## 2021-05-12 MED ORDER — SODIUM CHLORIDE 0.9 % IV SOLN
75.0000 mg/m2 | Freq: Once | INTRAVENOUS | Status: AC
  Administered 2021-05-12: 165 mg via INTRAVENOUS
  Filled 2021-05-12: qty 16.5

## 2021-05-12 MED ORDER — HEPARIN SOD (PORK) LOCK FLUSH 100 UNIT/ML IV SOLN
500.0000 [IU] | Freq: Once | INTRAVENOUS | Status: AC | PRN
  Administered 2021-05-12: 500 [IU]
  Filled 2021-05-12: qty 5

## 2021-05-12 NOTE — Patient Instructions (Signed)
Azacitidine suspension for injection (subcutaneous use) What is this medicine? AZACITIDINE (ay za SITE i deen) is a chemotherapy drug. This medicine reduces the growth of cancer cells and can suppress the immune system. It is used for treating myelodysplastic syndrome or some types of leukemia. This medicine may be used for other purposes; ask your health care provider or pharmacist if you have questions. COMMON BRAND NAME(S): Vidaza What should I tell my health care provider before I take this medicine? They need to know if you have any of these conditions:  kidney disease  liver disease  liver tumors  an unusual or allergic reaction to azacitidine, mannitol, other medicines, foods, dyes, or preservatives  pregnant or trying to get pregnant  breast-feeding How should I use this medicine? This medicine is for injection under the skin. It is administered in a hospital or clinic by a specially trained health care professional. Talk to your pediatrician regarding the use of this medicine in children. While this drug may be prescribed for selected conditions, precautions do apply. Overdosage: If you think you have taken too much of this medicine contact a poison control center or emergency room at once. NOTE: This medicine is only for you. Do not share this medicine with others. What if I miss a dose? It is important not to miss your dose. Call your doctor or health care professional if you are unable to keep an appointment. What may interact with this medicine? Interactions have not been studied. Give your health care provider a list of all the medicines, herbs, non-prescription drugs, or dietary supplements you use. Also tell them if you smoke, drink alcohol, or use illegal drugs. Some items may interact with your medicine. This list may not describe all possible interactions. Give your health care provider a list of all the medicines, herbs, non-prescription drugs, or dietary supplements  you use. Also tell them if you smoke, drink alcohol, or use illegal drugs. Some items may interact with your medicine. What should I watch for while using this medicine? Visit your doctor for checks on your progress. This drug may make you feel generally unwell. This is not uncommon, as chemotherapy can affect healthy cells as well as cancer cells. Report any side effects. Continue your course of treatment even though you feel ill unless your doctor tells you to stop. In some cases, you may be given additional medicines to help with side effects. Follow all directions for their use. Call your doctor or health care professional for advice if you get a fever, chills or sore throat, or other symptoms of a cold or flu. Do not treat yourself. This drug decreases your body's ability to fight infections. Try to avoid being around people who are sick. This medicine may increase your risk to bruise or bleed. Call your doctor or health care professional if you notice any unusual bleeding. You may need blood work done while you are taking this medicine. Do not become pregnant while taking this medicine and for 6 months after the last dose. Women should inform their doctor if they wish to become pregnant or think they might be pregnant. Men should not father a child while taking this medicine and for 3 months after the last dose. There is a potential for serious side effects to an unborn child. Talk to your health care professional or pharmacist for more information. Do not breast-feed an infant while taking this medicine and for 1 week after the last dose. This medicine may interfere with   the ability to have a child. Talk with your doctor or health care professional if you are concerned about your fertility. What side effects may I notice from receiving this medicine? Side effects that you should report to your doctor or health care professional as soon as possible:  allergic reactions like skin rash, itching or  hives, swelling of the face, lips, or tongue  low blood counts - this medicine may decrease the number of white blood cells, red blood cells and platelets. You may be at increased risk for infections and bleeding.  signs of infection - fever or chills, cough, sore throat, pain passing urine  signs of decreased platelets or bleeding - bruising, pinpoint red spots on the skin, black, tarry stools, blood in the urine  signs of decreased red blood cells - unusually weak or tired, fainting spells, lightheadedness  signs and symptoms of kidney injury like trouble passing urine or change in the amount of urine  signs and symptoms of liver injury like dark yellow or brown urine; general ill feeling or flu-like symptoms; light-colored stools; loss of appetite; nausea; right upper belly pain; unusually weak or tired; yellowing of the eyes or skin Side effects that usually do not require medical attention (report to your doctor or health care professional if they continue or are bothersome):  constipation  diarrhea  nausea, vomiting  pain or redness at the injection site  unusually weak or tired This list may not describe all possible side effects. Call your doctor for medical advice about side effects. You may report side effects to FDA at 1-800-FDA-1088. Where should I keep my medicine? This drug is given in a hospital or clinic and will not be stored at home. NOTE: This sheet is a summary. It may not cover all possible information. If you have questions about this medicine, talk to your doctor, pharmacist, or health care provider.  2021 Elsevier/Gold Standard (2016-12-27 14:37:51)  

## 2021-05-12 NOTE — Progress Notes (Signed)
1110 -  Pt ambulated to bathroom using IV pole for support.Upon reaching the bathroom pt became dizzy and fell onto toilet.  Fall heard by T.Hill, LPN who immediately responded & called for help. Pt did not lose consciousness. Pt's affect at baseline. Bleeding abrasion noted to lt arm. Slight abrasion noted to temple at eyeglass, not bleeding. Pt denies need to be assessed by MD or have head scanned. Pupils normal. No cognitive changes. Vitals as noted. Returned to recliner via w/c. Pt's wife notified in lobby.

## 2021-05-13 ENCOUNTER — Inpatient Hospital Stay: Payer: PPO

## 2021-05-13 ENCOUNTER — Other Ambulatory Visit: Payer: Self-pay

## 2021-05-13 VITALS — BP 124/41 | HR 80 | Temp 98.4°F | Resp 17

## 2021-05-13 DIAGNOSIS — Z5111 Encounter for antineoplastic chemotherapy: Secondary | ICD-10-CM | POA: Diagnosis not present

## 2021-05-13 DIAGNOSIS — D46Z Other myelodysplastic syndromes: Secondary | ICD-10-CM

## 2021-05-13 MED ORDER — SODIUM CHLORIDE 0.9 % IV SOLN
75.0000 mg/m2 | Freq: Once | INTRAVENOUS | Status: AC
  Administered 2021-05-13: 165 mg via INTRAVENOUS
  Filled 2021-05-13: qty 16.5

## 2021-05-13 MED ORDER — PALONOSETRON HCL INJECTION 0.25 MG/5ML
0.2500 mg | Freq: Once | INTRAVENOUS | Status: AC
  Administered 2021-05-13: 0.25 mg via INTRAVENOUS

## 2021-05-13 MED ORDER — PALONOSETRON HCL INJECTION 0.25 MG/5ML
INTRAVENOUS | Status: AC
  Filled 2021-05-13: qty 5

## 2021-05-13 MED ORDER — VENETOCLAX 100 MG PO TABS
400.0000 mg | ORAL_TABLET | Freq: Every day | ORAL | 5 refills | Status: DC
  Filled 2021-05-13: qty 120, 30d supply, fill #0

## 2021-05-13 MED ORDER — HEPARIN SOD (PORK) LOCK FLUSH 100 UNIT/ML IV SOLN
500.0000 [IU] | Freq: Once | INTRAVENOUS | Status: AC | PRN
  Administered 2021-05-13: 500 [IU]
  Filled 2021-05-13: qty 5

## 2021-05-13 MED ORDER — SODIUM CHLORIDE 0.9% FLUSH
10.0000 mL | INTRAVENOUS | Status: DC | PRN
  Administered 2021-05-13: 10 mL
  Filled 2021-05-13: qty 10

## 2021-05-13 MED ORDER — SODIUM CHLORIDE 0.9 % IV SOLN
10.0000 mg | Freq: Once | INTRAVENOUS | Status: AC
  Administered 2021-05-13: 10 mg via INTRAVENOUS
  Filled 2021-05-13: qty 10

## 2021-05-13 MED ORDER — SODIUM CHLORIDE 0.9 % IV SOLN
Freq: Once | INTRAVENOUS | Status: AC
  Filled 2021-05-13: qty 250

## 2021-05-13 NOTE — Patient Instructions (Signed)
Azacitidine suspension for injection (subcutaneous use) What is this medicine? AZACITIDINE (ay za SITE i deen) is a chemotherapy drug. This medicine reduces the growth of cancer cells and can suppress the immune system. It is used for treating myelodysplastic syndrome or some types of leukemia. This medicine may be used for other purposes; ask your health care provider or pharmacist if you have questions. COMMON BRAND NAME(S): Vidaza What should I tell my health care provider before I take this medicine? They need to know if you have any of these conditions:  kidney disease  liver disease  liver tumors  an unusual or allergic reaction to azacitidine, mannitol, other medicines, foods, dyes, or preservatives  pregnant or trying to get pregnant  breast-feeding How should I use this medicine? This medicine is for injection under the skin. It is administered in a hospital or clinic by a specially trained health care professional. Talk to your pediatrician regarding the use of this medicine in children. While this drug may be prescribed for selected conditions, precautions do apply. Overdosage: If you think you have taken too much of this medicine contact a poison control center or emergency room at once. NOTE: This medicine is only for you. Do not share this medicine with others. What if I miss a dose? It is important not to miss your dose. Call your doctor or health care professional if you are unable to keep an appointment. What may interact with this medicine? Interactions have not been studied. Give your health care provider a list of all the medicines, herbs, non-prescription drugs, or dietary supplements you use. Also tell them if you smoke, drink alcohol, or use illegal drugs. Some items may interact with your medicine. This list may not describe all possible interactions. Give your health care provider a list of all the medicines, herbs, non-prescription drugs, or dietary supplements  you use. Also tell them if you smoke, drink alcohol, or use illegal drugs. Some items may interact with your medicine. What should I watch for while using this medicine? Visit your doctor for checks on your progress. This drug may make you feel generally unwell. This is not uncommon, as chemotherapy can affect healthy cells as well as cancer cells. Report any side effects. Continue your course of treatment even though you feel ill unless your doctor tells you to stop. In some cases, you may be given additional medicines to help with side effects. Follow all directions for their use. Call your doctor or health care professional for advice if you get a fever, chills or sore throat, or other symptoms of a cold or flu. Do not treat yourself. This drug decreases your body's ability to fight infections. Try to avoid being around people who are sick. This medicine may increase your risk to bruise or bleed. Call your doctor or health care professional if you notice any unusual bleeding. You may need blood work done while you are taking this medicine. Do not become pregnant while taking this medicine and for 6 months after the last dose. Women should inform their doctor if they wish to become pregnant or think they might be pregnant. Men should not father a child while taking this medicine and for 3 months after the last dose. There is a potential for serious side effects to an unborn child. Talk to your health care professional or pharmacist for more information. Do not breast-feed an infant while taking this medicine and for 1 week after the last dose. This medicine may interfere with   the ability to have a child. Talk with your doctor or health care professional if you are concerned about your fertility. What side effects may I notice from receiving this medicine? Side effects that you should report to your doctor or health care professional as soon as possible:  allergic reactions like skin rash, itching or  hives, swelling of the face, lips, or tongue  low blood counts - this medicine may decrease the number of white blood cells, red blood cells and platelets. You may be at increased risk for infections and bleeding.  signs of infection - fever or chills, cough, sore throat, pain passing urine  signs of decreased platelets or bleeding - bruising, pinpoint red spots on the skin, black, tarry stools, blood in the urine  signs of decreased red blood cells - unusually weak or tired, fainting spells, lightheadedness  signs and symptoms of kidney injury like trouble passing urine or change in the amount of urine  signs and symptoms of liver injury like dark yellow or brown urine; general ill feeling or flu-like symptoms; light-colored stools; loss of appetite; nausea; right upper belly pain; unusually weak or tired; yellowing of the eyes or skin Side effects that usually do not require medical attention (report to your doctor or health care professional if they continue or are bothersome):  constipation  diarrhea  nausea, vomiting  pain or redness at the injection site  unusually weak or tired This list may not describe all possible side effects. Call your doctor for medical advice about side effects. You may report side effects to FDA at 1-800-FDA-1088. Where should I keep my medicine? This drug is given in a hospital or clinic and will not be stored at home. NOTE: This sheet is a summary. It may not cover all possible information. If you have questions about this medicine, talk to your doctor, pharmacist, or health care provider.  2021 Elsevier/Gold Standard (2016-12-27 14:37:51)  

## 2021-05-14 ENCOUNTER — Telehealth: Payer: Self-pay | Admitting: Pharmacist

## 2021-05-14 ENCOUNTER — Inpatient Hospital Stay: Payer: PPO

## 2021-05-14 ENCOUNTER — Other Ambulatory Visit: Payer: Self-pay | Admitting: *Deleted

## 2021-05-14 ENCOUNTER — Telehealth: Payer: Self-pay | Admitting: Pharmacy Technician

## 2021-05-14 ENCOUNTER — Other Ambulatory Visit (HOSPITAL_COMMUNITY): Payer: Self-pay

## 2021-05-14 ENCOUNTER — Encounter: Payer: Self-pay | Admitting: Hematology & Oncology

## 2021-05-14 ENCOUNTER — Telehealth: Payer: Self-pay | Admitting: *Deleted

## 2021-05-14 VITALS — BP 123/53 | HR 62 | Temp 98.2°F | Resp 17

## 2021-05-14 DIAGNOSIS — D46Z Other myelodysplastic syndromes: Secondary | ICD-10-CM

## 2021-05-14 DIAGNOSIS — D649 Anemia, unspecified: Secondary | ICD-10-CM

## 2021-05-14 DIAGNOSIS — Z5111 Encounter for antineoplastic chemotherapy: Secondary | ICD-10-CM | POA: Diagnosis not present

## 2021-05-14 LAB — CBC WITH DIFFERENTIAL (CANCER CENTER ONLY)
Abs Immature Granulocytes: 0.2 10*3/uL — ABNORMAL HIGH (ref 0.00–0.07)
Basophils Absolute: 0 10*3/uL (ref 0.0–0.1)
Basophils Relative: 0 %
Eosinophils Absolute: 0 10*3/uL (ref 0.0–0.5)
Eosinophils Relative: 0 %
HCT: 18.2 % — ABNORMAL LOW (ref 39.0–52.0)
Hemoglobin: 5.9 g/dL — CL (ref 13.0–17.0)
Immature Granulocytes: 12 %
Lymphocytes Relative: 46 %
Lymphs Abs: 0.8 10*3/uL (ref 0.7–4.0)
MCH: 31.1 pg (ref 26.0–34.0)
MCHC: 32.4 g/dL (ref 30.0–36.0)
MCV: 95.8 fL (ref 80.0–100.0)
Monocytes Absolute: 0.3 10*3/uL (ref 0.1–1.0)
Monocytes Relative: 16 %
Neutro Abs: 0.5 10*3/uL — ABNORMAL LOW (ref 1.7–7.7)
Neutrophils Relative %: 26 %
Platelet Count: 34 10*3/uL — ABNORMAL LOW (ref 150–400)
RBC: 1.9 MIL/uL — ABNORMAL LOW (ref 4.22–5.81)
RDW: 21.5 % — ABNORMAL HIGH (ref 11.5–15.5)
WBC Count: 1.7 10*3/uL — ABNORMAL LOW (ref 4.0–10.5)
WBC Morphology: 3
nRBC: 6.9 % — ABNORMAL HIGH (ref 0.0–0.2)

## 2021-05-14 LAB — SAMPLE TO BLOOD BANK

## 2021-05-14 LAB — PREPARE RBC (CROSSMATCH)

## 2021-05-14 MED ORDER — VENETOCLAX 100 MG PO TABS
ORAL_TABLET | ORAL | 0 refills | Status: DC
  Filled 2021-05-14: qty 77, fill #0
  Filled 2021-05-17: qty 77, 28d supply, fill #0

## 2021-05-14 MED ORDER — SODIUM CHLORIDE 0.9% IV SOLUTION
250.0000 mL | Freq: Once | INTRAVENOUS | Status: AC
  Administered 2021-05-14: 250 mL via INTRAVENOUS
  Filled 2021-05-14: qty 250

## 2021-05-14 MED ORDER — SODIUM CHLORIDE 0.9% FLUSH
10.0000 mL | INTRAVENOUS | Status: AC | PRN
  Administered 2021-05-14: 10 mL
  Filled 2021-05-14: qty 10

## 2021-05-14 MED ORDER — FUROSEMIDE 10 MG/ML IJ SOLN: 20.0000 mg | Freq: Once | INTRAMUSCULAR | Status: DC

## 2021-05-14 MED ORDER — DIPHENHYDRAMINE HCL 25 MG PO CAPS
ORAL_CAPSULE | ORAL | Status: AC
  Filled 2021-05-14: qty 1

## 2021-05-14 MED ORDER — ACETAMINOPHEN 325 MG PO TABS
ORAL_TABLET | ORAL | Status: AC
  Filled 2021-05-14: qty 2

## 2021-05-14 MED ORDER — HEPARIN SOD (PORK) LOCK FLUSH 100 UNIT/ML IV SOLN
250.0000 [IU] | INTRAVENOUS | Status: DC | PRN
  Filled 2021-05-14: qty 5

## 2021-05-14 MED ORDER — SODIUM CHLORIDE 0.9 % IV SOLN
10.0000 mg | Freq: Once | INTRAVENOUS | Status: AC
  Administered 2021-05-14: 10 mg via INTRAVENOUS
  Filled 2021-05-14: qty 10

## 2021-05-14 MED ORDER — SODIUM CHLORIDE 0.9 % IV SOLN
75.0000 mg/m2 | Freq: Once | INTRAVENOUS | Status: AC
  Administered 2021-05-14: 165 mg via INTRAVENOUS
  Filled 2021-05-14: qty 16.5

## 2021-05-14 MED ORDER — SODIUM CHLORIDE 0.9 % IV SOLN
Freq: Once | INTRAVENOUS | Status: AC
  Filled 2021-05-14: qty 250

## 2021-05-14 MED ORDER — DIPHENHYDRAMINE HCL 25 MG PO CAPS
25.0000 mg | ORAL_CAPSULE | Freq: Once | ORAL | Status: AC
Start: 2021-05-14 — End: 2021-05-14
  Administered 2021-05-14: 25 mg via ORAL

## 2021-05-14 MED ORDER — SODIUM CHLORIDE 0.9% FLUSH
10.0000 mL | INTRAVENOUS | Status: DC | PRN
  Filled 2021-05-14: qty 10

## 2021-05-14 MED ORDER — HEPARIN SOD (PORK) LOCK FLUSH 100 UNIT/ML IV SOLN
500.0000 [IU] | Freq: Once | INTRAVENOUS | Status: AC | PRN
Start: 2021-05-14 — End: 2021-05-14
  Administered 2021-05-14: 500 [IU]
  Filled 2021-05-14: qty 5

## 2021-05-14 MED ORDER — ACETAMINOPHEN 325 MG PO TABS: 650.0000 mg | ORAL_TABLET | Freq: Once | ORAL | Status: DC

## 2021-05-14 NOTE — Telephone Encounter (Signed)
Dr. Marin Olp notified of HGB-5.9.  Orders received for pt to get one unit of PRBC's today per Dr. Marin Olp.

## 2021-05-14 NOTE — Patient Instructions (Signed)
[facility]  Discharge Instructions: Thank you for choosing [facility] to provide your oncology and hematology care.   If you have a lab appointment with the [facility], please go directly to the [facility] and check in at the registration area.  Wear comfortable clothing and clothing appropriate for easy access to any Portacath or PICC line.   We strive to give you quality time with your provider. You may need to reschedule your appointment if you arrive late (15 or more minutes).  Arriving late affects you and other patients whose appointments are after yours.  Also, if you miss three or more appointments without notifying the office, you may be dismissed from the clinic at the provider's discretion.      For prescription refill requests, have your pharmacy contact our office and allow 72 hours for refills to be completed.    Today you received the following chemotherapy and/or immunotherapy agents Vidaza.   Blood Transfusion, Adult, Care After This sheet gives you information about how to care for yourself after your procedure. Your doctor may also give you more specific instructions. If you have problems or questions, contact your doctor. What can I expect after the procedure? After the procedure, it is common to have:  Bruising and soreness at the IV site.  A fever or chills on the day of the procedure. This may be your body's response to the new blood cells received.  A headache. Follow these instructions at home: Insertion site care  Follow instructions from your doctor about how to take care of your insertion site. This is where an IV tube was put into your vein. Make sure you: ? Wash your hands with soap and water before and after you change your bandage (dressing). If you cannot use soap and water, use hand sanitizer. ? Change your bandage as told by your doctor.  Check your insertion site every day for signs of infection.  Check for: ? Redness, swelling, or pain. ? Bleeding from the site. ? Warmth. ? Pus or a bad smell.      General instructions  Take over-the-counter and prescription medicines only as told by your doctor.  Rest as told by your doctor.  Go back to your normal activities as told by your doctor.  Keep all follow-up visits as told by your doctor. This is important. Contact a doctor if:  You have itching or red, swollen areas of skin (hives).  You feel worried or nervous (anxious).  You feel weak after doing your normal activities.  You have redness, swelling, warmth, or pain around the insertion site.  You have blood coming from the insertion site, and the blood does not stop with pressure.  You have pus or a bad smell coming from the insertion site. Get help right away if:  You have signs of a serious reaction. This may be coming from an allergy or the body's defense system (immune system). Signs include: ? Trouble breathing or shortness of breath. ? Swelling of the face or feeling warm (flushed). ? Fever or chills. ? Head, chest, or back pain. ? Dark pee (urine) or blood in the pee. ? Widespread rash. ? Fast heartbeat. ? Feeling dizzy or light-headed. You may receive your blood transfusion in an outpatient setting. If so, you will be told whom to contact to report any reactions. These symptoms may be an emergency. Do not wait to see if the symptoms will go away. Get  medical help right away. Call your local emergency services (911 in the [place] Do not drive yourself to the hospital. Summary  Bruising and soreness at the IV site are common.  Check your insertion site every day for signs of infection.  Rest as told by your doctor. Go back to your normal activities as told by your doctor.  Get help right away if you have signs of a serious reaction. This information is not intended to replace advice given to you by your health care provider. Make sure you discuss any  questions you have with your health care provider. Document Revised: 05/23/2019 Document Reviewed: 05/23/2019 Elsevier Patient Education  2021 [place].      To help prevent nausea and vomiting after your treatment, we encourage you to take your nausea medication as directed.  BELOW ARE SYMPTOMS THAT SHOULD BE REPORTED IMMEDIATELY: . *FEVER GREATER THAN 100.4 F (38 C) OR HIGHER . *CHILLS OR SWEATING . *NAUSEA AND VOMITING THAT IS NOT CONTROLLED WITH YOUR NAUSEA MEDICATION . *UNUSUAL SHORTNESS OF BREATH . *UNUSUAL BRUISING OR BLEEDING . *URINARY PROBLEMS (pain or burning when urinating, or frequent urination) . *BOWEL PROBLEMS (unusual diarrhea, constipation, pain near the anus) . TENDERNESS IN MOUTH AND THROAT WITH OR WITHOUT PRESENCE OF ULCERS (sore throat, sores in mouth, or a toothache) . UNUSUAL RASH, SWELLING OR PAIN  . UNUSUAL VAGINAL DISCHARGE OR ITCHING   Items with * indicate a potential emergency and should be followed up as soon as possible or go to the Emergency Department if any problems should occur.  Please show the CHEMOTHERAPY ALERT CARD or IMMUNOTHERAPY ALERT CARD at check-in to the Emergency Department and triage nurse. Should you have questions after your visit or need to cancel or reschedule your appointment, please contact [facility]  [phone] and follow the prompts.  Office hours are 8:00 a.m. to 4:30 p.m. Monday - Friday. Please note that voicemails left after 4:00 p.m. may not be returned until the following business day.  We are closed weekends and major holidays. You have access to a nurse at all times for urgent questions. Please call the main number to the clinic [phone] and follow the prompts.  For any non-urgent questions, you may also contact your provider using MyChart. We now offer e-Visits for anyone 1 and older to request care online for non-urgent symptoms. For details visit [email or web address].   Also  download the MyChart app! Go to the app store, search "MyChart", open the app, select [facility], and log in with your MyChart username and password.  Due to Covid, a mask is required upon entering the hospital/clinic. If you do not have a mask, one will be given to you upon arrival. For doctor visits, patients may have 1 support person aged 73 or older with them. For treatment visits, patients cannot have anyone with them due to current Covid guidelines and our immunocompromised population.

## 2021-05-14 NOTE — Telephone Encounter (Signed)
Oral Chemotherapy Pharmacist Encounter  Patient Education I spoke with patient and his wife Caren Griffins for overview of new oral chemotherapy medication: Venclexta (venetoclax) for the treatment of MDS with excess blast (MDS-EB-2) in conjunction with azacitidine (existing therapy), planned duration until disease progression or unacceptable drug toxicity.   Counseled patient on administration, dosing, side effects, monitoring, drug-food interactions, safe handling, storage, and disposal. Patient will take 1 tablet (100mg ) by mouth daily for 1 week, then 2 tablets (200mg ) for 1 week, followed by 4 tablets (400mg ) daily.Tablets should be swallowed whole with a meal and a full glass of water.  Side effects include but not limited to: diarrhea, N/V, fatigue, risk of TLS, decrease wbc/hgb/plt.    Encouraged Mr. Mikelson to stay hydrated.  Reviewed with the [place] importance of keeping a medication schedule and plan for any missed doses.  After discussion with he [place] no patient barriers to medication adherence identified.   The [facility] voiced understanding and appreciation. All questions answered. Medication handout provided.  Provided them with Oral Chemotherapy Navigation Clinic phone number. They know to call the office with questions or concerns. Oral Chemotherapy Navigation Clinic will continue to follow.  Darl Pikes, PharmD, BCPS, BCOP, CPP Hematology/Oncology Clinical Pharmacist Practitioner [facility] [phone]  05/14/2021 4:58 PM

## 2021-05-14 NOTE — Telephone Encounter (Signed)
Oral Oncology Patient Advocate Encounter  Received notification from [employer] that prior authorization for Venclexta is required.  PA submitted on CoverMyMeds Key [id number] Status is pending  Oral Oncology Clinic will continue to follow.  [place] Patient [place] Phone [phone] Fax [phone] 05/14/2021 12:26 PM

## 2021-05-14 NOTE — Telephone Encounter (Signed)
Oral Oncology Pharmacist Encounter  Received new prescription for Venclexta (venetoclax) for the treatment of MDS with excess blast (MDS-EB-2) in conjunction with azacitidine (existing therapy), planned duration until disease progression or unacceptable drug toxicity.  BMP from 05/11/21 and CBC from 05/14/21 assessed, no relevant lab abnormalities. Patient had had elevated SCr in the past, this will need to continue to be monitor along with other signs of TLS (K, Phos, uric acid). Prescription dose and frequency assessed.   Current medication list in Epic reviewed, one DDIs with venetoclax identified: -Sitagliptin-metformin: Hyperglycemia-associated agents (venetoclax) may diminish the therapeutic effect of antidiabetic agents. No baseline dose adjustment needed.  Evaluated chart and no patient barriers to medication adherence identified.   Prescription has been e-scribed to the [facility] for benefits analysis and approval.  Oral Oncology Clinic will continue to follow for insurance authorization, copayment issues, initial counseling and start date.   Darl Pikes, PharmD, BCPS, BCOP, CPP Hematology/Oncology Clinical Pharmacist Practitioner [facility] [phone]  05/14/2021 1:31 PM

## 2021-05-14 NOTE — Telephone Encounter (Signed)
Oral Oncology Patient Advocate Encounter   Was successful in securing patient a $10,500 grant from Patient [place] (PAF) to provide copayment coverage for Venclexta.  This will keep the out of pocket expense at $0.     I have spoken with the patient.    The billing information is as follows and has been shared with [place].   RxBin: [id number] PCN:  [id number] Member ID: [id number] Group ID: [id number] Dates of Eligibility: 05/14/21 through 05/14/22  Fund: [place] Patient [place] Phone [phone] Fax [phone] 05/14/2021 4:33 PM]

## 2021-05-14 NOTE — Telephone Encounter (Signed)
Oral Oncology Patient Advocate Encounter  Prior Authorization for Lynita [facility] has been approved.    PA# [id number] Effective dates: 05/14/21 through 05/14/22  Patients co-pay is $2657.92.  Oral Oncology Clinic will continue to follow.   [place] Patient [place] Phone [phone] Fax [phone] 05/14/2021 1:37 PM

## 2021-05-17 ENCOUNTER — Telehealth: Payer: Self-pay | Admitting: *Deleted

## 2021-05-17 ENCOUNTER — Encounter: Payer: Self-pay | Admitting: Hematology & Oncology

## 2021-05-17 ENCOUNTER — Other Ambulatory Visit: Payer: Self-pay

## 2021-05-17 ENCOUNTER — Inpatient Hospital Stay: Payer: PPO

## 2021-05-17 ENCOUNTER — Other Ambulatory Visit (HOSPITAL_COMMUNITY): Payer: Self-pay

## 2021-05-17 DIAGNOSIS — D46Z Other myelodysplastic syndromes: Secondary | ICD-10-CM

## 2021-05-17 DIAGNOSIS — Z5111 Encounter for antineoplastic chemotherapy: Secondary | ICD-10-CM | POA: Diagnosis not present

## 2021-05-17 LAB — CBC WITH DIFFERENTIAL (CANCER CENTER ONLY)
Abs Immature Granulocytes: 0.15 10*3/uL — ABNORMAL HIGH (ref 0.00–0.07)
Basophils Absolute: 0 10*3/uL (ref 0.0–0.1)
Basophils Relative: 0 %
Eosinophils Absolute: 0 10*3/uL (ref 0.0–0.5)
Eosinophils Relative: 0 %
HCT: 22.1 % — ABNORMAL LOW (ref 39.0–52.0)
Hemoglobin: 7.3 g/dL — ABNORMAL LOW (ref 13.0–17.0)
Immature Granulocytes: 14 %
Lymphocytes Relative: 44 %
Lymphs Abs: 0.5 10*3/uL — ABNORMAL LOW (ref 0.7–4.0)
MCH: 31.1 pg (ref 26.0–34.0)
MCHC: 33 g/dL (ref 30.0–36.0)
MCV: 94 fL (ref 80.0–100.0)
Monocytes Absolute: 0.1 10*3/uL (ref 0.1–1.0)
Monocytes Relative: 12 %
Neutro Abs: 0.3 10*3/uL — CL (ref 1.7–7.7)
Neutrophils Relative %: 30 %
Platelet Count: 29 10*3/uL — ABNORMAL LOW (ref 150–400)
RBC: 2.35 MIL/uL — ABNORMAL LOW (ref 4.22–5.81)
RDW: 20.1 % — ABNORMAL HIGH (ref 11.5–15.5)
WBC Count: 1.1 10*3/uL — ABNORMAL LOW (ref 4.0–10.5)
nRBC: 7.3 % — ABNORMAL HIGH (ref 0.0–0.2)

## 2021-05-17 LAB — BASIC METABOLIC PANEL - CANCER CENTER ONLY
Anion gap: 8 (ref 5–15)
BUN: 21 mg/dL (ref 8–23)
CO2: 22 mmol/L (ref 22–32)
Calcium: 9.3 mg/dL (ref 8.9–10.3)
Chloride: 98 mmol/L (ref 98–111)
Creatinine: 1.35 mg/dL — ABNORMAL HIGH (ref 0.61–1.24)
GFR, Estimated: 56 mL/min — ABNORMAL LOW (ref >60)
Glucose, Bld: 208 mg/dL — ABNORMAL HIGH (ref 70–99)
Potassium: 3.8 mmol/L (ref 3.5–5.1)
Sodium: 128 mmol/L — ABNORMAL LOW (ref 135–145)

## 2021-05-17 LAB — BPAM RBC
Blood Product Expiration Date: 202206172359
ISSUE DATE / TIME: 202206031306
Unit Type and Rh: 6200

## 2021-05-17 LAB — TYPE AND SCREEN
ABO/RH(D): A POS
Antibody Screen: NEGATIVE
Unit division: 0

## 2021-05-17 MED ORDER — DEXAMETHASONE SODIUM PHOSPHATE 100 MG/10ML IJ SOLN
10.0000 mg | Freq: Once | INTRAMUSCULAR | Status: AC
  Administered 2021-05-17: 10 mg via INTRAVENOUS
  Filled 2021-05-17: qty 10

## 2021-05-17 MED ORDER — SODIUM CHLORIDE 0.9 % IV SOLN
75.0000 mg/m2 | Freq: Once | INTRAVENOUS | Status: AC
  Administered 2021-05-17: 165 mg via INTRAVENOUS
  Filled 2021-05-17: qty 16.5

## 2021-05-17 MED ORDER — HEPARIN SOD (PORK) LOCK FLUSH 100 UNIT/ML IV SOLN
500.0000 [IU] | Freq: Once | INTRAVENOUS | Status: AC | PRN
  Administered 2021-05-17: 500 [IU]
  Filled 2021-05-17: qty 5

## 2021-05-17 MED ORDER — SODIUM CHLORIDE 0.9 % IV SOLN
Freq: Once | INTRAVENOUS | Status: AC
Start: 2021-05-17 — End: 2021-05-17
  Filled 2021-05-17: qty 250

## 2021-05-17 MED ORDER — PALONOSETRON HCL INJECTION 0.25 MG/5ML
INTRAVENOUS | Status: AC
  Filled 2021-05-17: qty 5

## 2021-05-17 MED ORDER — SODIUM CHLORIDE 0.9% FLUSH
10.0000 mL | INTRAVENOUS | Status: DC | PRN
  Administered 2021-05-17: 10 mL
  Filled 2021-05-17: qty 10

## 2021-05-17 MED ORDER — PALONOSETRON HCL INJECTION 0.25 MG/5ML
0.2500 mg | Freq: Once | INTRAVENOUS | Status: AC
  Administered 2021-05-17: 0.25 mg via INTRAVENOUS

## 2021-05-17 NOTE — Progress Notes (Signed)
Per dr Marin Olp okay to treat today, despite labs

## 2021-05-17 NOTE — Telephone Encounter (Signed)
Dr. Ennever notified of ANC-0.3.  No new orders received at this time.  

## 2021-05-17 NOTE — Patient Instructions (Signed)
Azacitidine suspension for injection (subcutaneous use) What is this medicine? AZACITIDINE (ay za SITE i deen) is a chemotherapy drug. This medicine reduces the growth of cancer cells and can suppress the immune system. It is used for treating myelodysplastic syndrome or some types of leukemia. This medicine may be used for other purposes; ask your health care provider or pharmacist if you have questions. COMMON BRAND NAME(S): Vidaza What should I tell my health care provider before I take this medicine? They need to know if you have any of these conditions:  kidney disease  liver disease  liver tumors  an unusual or allergic reaction to azacitidine, mannitol, other medicines, foods, dyes, or preservatives  pregnant or trying to get pregnant  breast-feeding How should I use this medicine? This medicine is for injection under the skin. It is administered in a hospital or clinic by a specially trained health care professional. Talk to your pediatrician regarding the use of this medicine in children. While this drug may be prescribed for selected conditions, precautions do apply. Overdosage: If you think you have taken too much of this medicine contact a poison control center or emergency room at once. NOTE: This medicine is only for you. Do not share this medicine with others. What if I miss a dose? It is important not to miss your dose. Call your doctor or health care professional if you are unable to keep an appointment. What may interact with this medicine? Interactions have not been studied. Give your health care provider a list of all the medicines, herbs, non-prescription drugs, or dietary supplements you use. Also tell them if you smoke, drink alcohol, or use illegal drugs. Some items may interact with your medicine. This list may not describe all possible interactions. Give your health care provider a list of all the medicines, herbs, non-prescription drugs, or dietary supplements  you use. Also tell them if you smoke, drink alcohol, or use illegal drugs. Some items may interact with your medicine. What should I watch for while using this medicine? Visit your doctor for checks on your progress. This drug may make you feel generally unwell. This is not uncommon, as chemotherapy can affect healthy cells as well as cancer cells. Report any side effects. Continue your course of treatment even though you feel ill unless your doctor tells you to stop. In some cases, you may be given additional medicines to help with side effects. Follow all directions for their use. Call your doctor or health care professional for advice if you get a fever, chills or sore throat, or other symptoms of a cold or flu. Do not treat yourself. This drug decreases your body's ability to fight infections. Try to avoid being around people who are sick. This medicine may increase your risk to bruise or bleed. Call your doctor or health care professional if you notice any unusual bleeding. You may need blood work done while you are taking this medicine. Do not become pregnant while taking this medicine and for 6 months after the last dose. Women should inform their doctor if they wish to become pregnant or think they might be pregnant. Men should not father a child while taking this medicine and for 3 months after the last dose. There is a potential for serious side effects to an unborn child. Talk to your health care professional or pharmacist for more information. Do not breast-feed an infant while taking this medicine and for 1 week after the last dose. This medicine may interfere with   the ability to have a child. Talk with your doctor or health care professional if you are concerned about your fertility. What side effects may I notice from receiving this medicine? Side effects that you should report to your doctor or health care professional as soon as possible:  allergic reactions like skin rash, itching or  hives, swelling of the face, lips, or tongue  low blood counts - this medicine may decrease the number of white blood cells, red blood cells and platelets. You may be at increased risk for infections and bleeding.  signs of infection - fever or chills, cough, sore throat, pain passing urine  signs of decreased platelets or bleeding - bruising, pinpoint red spots on the skin, black, tarry stools, blood in the urine  signs of decreased red blood cells - unusually weak or tired, fainting spells, lightheadedness  signs and symptoms of kidney injury like trouble passing urine or change in the amount of urine  signs and symptoms of liver injury like dark yellow or brown urine; general ill feeling or flu-like symptoms; light-colored stools; loss of appetite; nausea; right upper belly pain; unusually weak or tired; yellowing of the eyes or skin Side effects that usually do not require medical attention (report to your doctor or health care professional if they continue or are bothersome):  constipation  diarrhea  nausea, vomiting  pain or redness at the injection site  unusually weak or tired This list may not describe all possible side effects. Call your doctor for medical advice about side effects. You may report side effects to FDA at 1-800-FDA-1088. Where should I keep my medicine? This drug is given in a hospital or clinic and will not be stored at home. NOTE: This sheet is a summary. It may not cover all possible information. If you have questions about this medicine, talk to your doctor, pharmacist, or health care provider.  2021 Elsevier/Gold Standard (2016-12-27 14:37:51)  

## 2021-05-17 NOTE — Patient Instructions (Signed)
Implanted Port Insertion, Care After This sheet gives you information about how to care for yourself after your procedure. Your health care provider may also give you more specific instructions. If you have problems or questions, contact your health care provider. What can I expect after the procedure? After the procedure, it is common to have:  Discomfort at the port insertion site.  Bruising on the skin over the port. This should improve over 3-4 days. Follow these instructions at home: Port care  After your port is placed, you will get a manufacturer's information card. The card has information about your port. Keep this card with you at all times.  Take care of the port as told by your health care provider. Ask your health care provider if you or a family member can get training for taking care of the port at home. A home health care nurse may also take care of the port.  Make sure to remember what type of port you have. Incision care  Follow instructions from your health care provider about how to take care of your port insertion site. Make sure you: ? Wash your hands with soap and water before and after you change your bandage (dressing). If soap and water are not available, use hand sanitizer. ? Change your dressing as told by your health care provider. ? Leave stitches (sutures), skin glue, or adhesive strips in place. These skin closures may need to stay in place for 2 weeks or longer. If adhesive strip edges start to loosen and curl up, you may trim the loose edges. Do not remove adhesive strips completely unless your health care provider tells you to do that.  Check your port insertion site every day for signs of infection. Check for: ? Redness, swelling, or pain. ? Fluid or blood. ? Warmth. ? Pus or a bad smell.      Activity  Return to your normal activities as told by your health care provider. Ask your health care provider what activities are safe for you.  Do not  lift anything that is heavier than 10 lb (4.5 kg), or the limit that you are told, until your health care provider says that it is safe. General instructions  Take over-the-counter and prescription medicines only as told by your health care provider.  Do not take baths, swim, or use a hot tub until your health care provider approves. Ask your health care provider if you may take showers. You may only be allowed to take sponge baths.  Do not drive for 24 hours if you were given a sedative during your procedure.  Wear a medical alert bracelet in case of an emergency. This will tell any health care providers that you have a port.  Keep all follow-up visits as told by your health care provider. This is important. Contact a health care provider if:  You cannot flush your port with saline as directed, or you cannot draw blood from the port.  You have a fever or chills.  You have redness, swelling, or pain around your port insertion site.  You have fluid or blood coming from your port insertion site.  Your port insertion site feels warm to the touch.  You have pus or a bad smell coming from the port insertion site. Get help right away if:  You have chest pain or shortness of breath.  You have bleeding from your port that you cannot control. Summary  Take care of the port as told by your   health care provider. Keep the manufacturer's information card with you at all times.  Change your dressing as told by your health care provider.  Contact a health care provider if you have a fever or chills or if you have redness, swelling, or pain around your port insertion site.  Keep all follow-up visits as told by your health care provider. This information is not intended to replace advice given to you by your health care provider. Make sure you discuss any questions you have with your health care provider. Document Revised: 06/26/2018 Document Reviewed: 06/26/2018 Elsevier Patient Education   2021 Elsevier Inc.  

## 2021-05-18 ENCOUNTER — Inpatient Hospital Stay: Payer: PPO

## 2021-05-18 VITALS — BP 126/53 | HR 70 | Temp 97.6°F | Resp 18

## 2021-05-18 DIAGNOSIS — Z5111 Encounter for antineoplastic chemotherapy: Secondary | ICD-10-CM | POA: Diagnosis not present

## 2021-05-18 DIAGNOSIS — D46Z Other myelodysplastic syndromes: Secondary | ICD-10-CM

## 2021-05-18 MED ORDER — SODIUM CHLORIDE 0.9% FLUSH
10.0000 mL | INTRAVENOUS | Status: DC | PRN
  Administered 2021-05-18: 10 mL
  Filled 2021-05-18: qty 10

## 2021-05-18 MED ORDER — SODIUM CHLORIDE 0.9 % IV SOLN
Freq: Once | INTRAVENOUS | Status: AC
  Filled 2021-05-18: qty 250

## 2021-05-18 MED ORDER — SODIUM CHLORIDE 0.9 % IV SOLN
75.0000 mg/m2 | Freq: Once | INTRAVENOUS | Status: AC
  Administered 2021-05-18: 165 mg via INTRAVENOUS
  Filled 2021-05-18: qty 16.5

## 2021-05-18 MED ORDER — SODIUM CHLORIDE 0.9 % IV SOLN
10.0000 mg | Freq: Once | INTRAVENOUS | Status: AC
  Administered 2021-05-18: 10 mg via INTRAVENOUS
  Filled 2021-05-18: qty 10

## 2021-05-18 MED ORDER — HEPARIN SOD (PORK) LOCK FLUSH 100 UNIT/ML IV SOLN
500.0000 [IU] | Freq: Once | INTRAVENOUS | Status: AC | PRN
  Administered 2021-05-18: 500 [IU]
  Filled 2021-05-18: qty 5

## 2021-05-18 NOTE — Patient Instructions (Signed)
Azacitidine suspension for injection (subcutaneous use) What is this medicine? AZACITIDINE (ay za SITE i deen) is a chemotherapy drug. This medicine reduces the growth of cancer cells and can suppress the immune system. It is used for treating myelodysplastic syndrome or some types of leukemia. This medicine may be used for other purposes; ask your health care provider or pharmacist if you have questions. COMMON BRAND NAME(S): Vidaza What should I tell my health care provider before I take this medicine? They need to know if you have any of these conditions:  kidney disease  liver disease  liver tumors  an unusual or allergic reaction to azacitidine, mannitol, other medicines, foods, dyes, or preservatives  pregnant or trying to get pregnant  breast-feeding How should I use this medicine? This medicine is for injection under the skin. It is administered in a hospital or clinic by a specially trained health care professional. Talk to your pediatrician regarding the use of this medicine in children. While this drug may be prescribed for selected conditions, precautions do apply. Overdosage: If you think you have taken too much of this medicine contact a poison control center or emergency room at once. NOTE: This medicine is only for you. Do not share this medicine with others. What if I miss a dose? It is important not to miss your dose. Call your doctor or health care professional if you are unable to keep an appointment. What may interact with this medicine? Interactions have not been studied. Give your health care provider a list of all the medicines, herbs, non-prescription drugs, or dietary supplements you use. Also tell them if you smoke, drink alcohol, or use illegal drugs. Some items may interact with your medicine. This list may not describe all possible interactions. Give your health care provider a list of all the medicines, herbs, non-prescription drugs, or dietary supplements  you use. Also tell them if you smoke, drink alcohol, or use illegal drugs. Some items may interact with your medicine. What should I watch for while using this medicine? Visit your doctor for checks on your progress. This drug may make you feel generally unwell. This is not uncommon, as chemotherapy can affect healthy cells as well as cancer cells. Report any side effects. Continue your course of treatment even though you feel ill unless your doctor tells you to stop. In some cases, you may be given additional medicines to help with side effects. Follow all directions for their use. Call your doctor or health care professional for advice if you get a fever, chills or sore throat, or other symptoms of a cold or flu. Do not treat yourself. This drug decreases your body's ability to fight infections. Try to avoid being around people who are sick. This medicine may increase your risk to bruise or bleed. Call your doctor or health care professional if you notice any unusual bleeding. You may need blood work done while you are taking this medicine. Do not become pregnant while taking this medicine and for 6 months after the last dose. Women should inform their doctor if they wish to become pregnant or think they might be pregnant. Men should not father a child while taking this medicine and for 3 months after the last dose. There is a potential for serious side effects to an unborn child. Talk to your health care professional or pharmacist for more information. Do not breast-feed an infant while taking this medicine and for 1 week after the last dose. This medicine may interfere with   the ability to have a child. Talk with your doctor or health care professional if you are concerned about your fertility. What side effects may I notice from receiving this medicine? Side effects that you should report to your doctor or health care professional as soon as possible:  allergic reactions like skin rash, itching or  hives, swelling of the face, lips, or tongue  low blood counts - this medicine may decrease the number of white blood cells, red blood cells and platelets. You may be at increased risk for infections and bleeding.  signs of infection - fever or chills, cough, sore throat, pain passing urine  signs of decreased platelets or bleeding - bruising, pinpoint red spots on the skin, black, tarry stools, blood in the urine  signs of decreased red blood cells - unusually weak or tired, fainting spells, lightheadedness  signs and symptoms of kidney injury like trouble passing urine or change in the amount of urine  signs and symptoms of liver injury like dark yellow or brown urine; general ill feeling or flu-like symptoms; light-colored stools; loss of appetite; nausea; right upper belly pain; unusually weak or tired; yellowing of the eyes or skin Side effects that usually do not require medical attention (report to your doctor or health care professional if they continue or are bothersome):  constipation  diarrhea  nausea, vomiting  pain or redness at the injection site  unusually weak or tired This list may not describe all possible side effects. Call your doctor for medical advice about side effects. You may report side effects to FDA at 1-800-FDA-1088. Where should I keep my medicine? This drug is given in a hospital or clinic and will not be stored at home. NOTE: This sheet is a summary. It may not cover all possible information. If you have questions about this medicine, talk to your doctor, pharmacist, or health care provider.  2021 Elsevier/Gold Standard (2016-12-27 14:37:51)  

## 2021-05-19 ENCOUNTER — Inpatient Hospital Stay: Payer: PPO

## 2021-05-19 ENCOUNTER — Other Ambulatory Visit: Payer: Self-pay | Admitting: *Deleted

## 2021-05-19 ENCOUNTER — Telehealth: Payer: Self-pay | Admitting: *Deleted

## 2021-05-19 VITALS — BP 115/51 | HR 72 | Temp 97.8°F | Resp 15

## 2021-05-19 DIAGNOSIS — Z5111 Encounter for antineoplastic chemotherapy: Secondary | ICD-10-CM | POA: Diagnosis not present

## 2021-05-19 DIAGNOSIS — D46Z Other myelodysplastic syndromes: Secondary | ICD-10-CM

## 2021-05-19 DIAGNOSIS — D649 Anemia, unspecified: Secondary | ICD-10-CM

## 2021-05-19 LAB — CBC WITH DIFFERENTIAL (CANCER CENTER ONLY)
Abs Immature Granulocytes: 0.2 10*3/uL — ABNORMAL HIGH (ref 0.00–0.07)
Band Neutrophils: 11 %
Basophils Absolute: 0 10*3/uL (ref 0.0–0.1)
Basophils Relative: 0 %
Blasts: 4 %
Eosinophils Absolute: 0 10*3/uL (ref 0.0–0.5)
Eosinophils Relative: 0 %
HCT: 20.6 % — ABNORMAL LOW (ref 39.0–52.0)
Hemoglobin: 6.8 g/dL — CL (ref 13.0–17.0)
Lymphocytes Relative: 57 %
Lymphs Abs: 1.5 10*3/uL (ref 0.7–4.0)
MCH: 31.1 pg (ref 26.0–34.0)
MCHC: 33 g/dL (ref 30.0–36.0)
MCV: 94.1 fL (ref 80.0–100.0)
Metamyelocytes Relative: 4 %
Monocytes Absolute: 0 10*3/uL — ABNORMAL LOW (ref 0.1–1.0)
Monocytes Relative: 0 %
Myelocytes: 2 %
Neutro Abs: 0.9 10*3/uL — ABNORMAL LOW (ref 1.7–7.7)
Neutrophils Relative %: 22 %
Platelet Count: 20 10*3/uL — ABNORMAL LOW (ref 150–400)
RBC: 2.19 MIL/uL — ABNORMAL LOW (ref 4.22–5.81)
RDW: 19.6 % — ABNORMAL HIGH (ref 11.5–15.5)
WBC Count: 2.6 10*3/uL — ABNORMAL LOW (ref 4.0–10.5)
nRBC: 4.2 % — ABNORMAL HIGH (ref 0.0–0.2)

## 2021-05-19 LAB — SAMPLE TO BLOOD BANK

## 2021-05-19 LAB — PREPARE RBC (CROSSMATCH)

## 2021-05-19 MED ORDER — SODIUM CHLORIDE 0.9 % IV SOLN
Freq: Once | INTRAVENOUS | Status: AC
  Filled 2021-05-19: qty 250

## 2021-05-19 MED ORDER — SODIUM CHLORIDE 0.9 % IV SOLN
10.0000 mg | Freq: Once | INTRAVENOUS | Status: AC
  Administered 2021-05-19: 10 mg via INTRAVENOUS
  Filled 2021-05-19: qty 10

## 2021-05-19 MED ORDER — SODIUM CHLORIDE 0.9% IV SOLUTION
250.0000 mL | Freq: Once | INTRAVENOUS | Status: DC
  Filled 2021-05-19: qty 250

## 2021-05-19 MED ORDER — SODIUM CHLORIDE 0.9% FLUSH
10.0000 mL | INTRAVENOUS | Status: DC | PRN
  Filled 2021-05-19: qty 10

## 2021-05-19 MED ORDER — SODIUM CHLORIDE 0.9 % IV SOLN
75.0000 mg/m2 | Freq: Once | INTRAVENOUS | Status: AC
  Administered 2021-05-19: 165 mg via INTRAVENOUS
  Filled 2021-05-19: qty 16.5

## 2021-05-19 MED ORDER — HEPARIN SOD (PORK) LOCK FLUSH 100 UNIT/ML IV SOLN
500.0000 [IU] | Freq: Once | INTRAVENOUS | Status: DC | PRN
  Filled 2021-05-19: qty 5

## 2021-05-19 NOTE — Telephone Encounter (Signed)
Oral Oncology Patient Advocate Encounter  I spoke with patients wife, Caren Griffins, Monday afternoon to set up first fill of Venclexta. Venclexta will be filled through [facility] and patient picked up on 05/18/21.  [facility] will call 7-10 days before next refill is due to complete adherence call and set up delivery of medication.     [facility] Patient [place] Phone [phone] Fax [phone] 05/19/2021 9:20 AM

## 2021-05-19 NOTE — Patient Instructions (Signed)
Azacitidine suspension for injection (subcutaneous use) What is this medicine? AZACITIDINE (ay za SITE i deen) is a chemotherapy drug. This medicine reduces the growth of cancer cells and can suppress the immune system. It is used for treating myelodysplastic syndrome or some types of leukemia. This medicine may be used for other purposes; ask your health care provider or pharmacist if you have questions. COMMON BRAND NAME(S): Vidaza What should I tell my health care provider before I take this medicine? They need to know if you have any of these conditions:  kidney disease  liver disease  liver tumors  an unusual or allergic reaction to azacitidine, mannitol, other medicines, foods, dyes, or preservatives  pregnant or trying to get pregnant  breast-feeding How should I use this medicine? This medicine is for injection under the skin. It is administered in a hospital or clinic by a specially trained health care professional. Talk to your pediatrician regarding the use of this medicine in children. While this drug may be prescribed for selected conditions, precautions do apply. Overdosage: If you think you have taken too much of this medicine contact a poison control center or emergency room at once. NOTE: This medicine is only for you. Do not share this medicine with others. What if I miss a dose? It is important not to miss your dose. Call your doctor or health care professional if you are unable to keep an appointment. What may interact with this medicine? Interactions have not been studied. Give your health care provider a list of all the medicines, herbs, non-prescription drugs, or dietary supplements you use. Also tell them if you smoke, drink alcohol, or use illegal drugs. Some items may interact with your medicine. This list may not describe all possible interactions. Give your health care provider a list of all the medicines, herbs, non-prescription drugs, or dietary supplements  you use. Also tell them if you smoke, drink alcohol, or use illegal drugs. Some items may interact with your medicine. What should I watch for while using this medicine? Visit your doctor for checks on your progress. This drug may make you feel generally unwell. This is not uncommon, as chemotherapy can affect healthy cells as well as cancer cells. Report any side effects. Continue your course of treatment even though you feel ill unless your doctor tells you to stop. In some cases, you may be given additional medicines to help with side effects. Follow all directions for their use. Call your doctor or health care professional for advice if you get a fever, chills or sore throat, or other symptoms of a cold or flu. Do not treat yourself. This drug decreases your body's ability to fight infections. Try to avoid being around people who are sick. This medicine may increase your risk to bruise or bleed. Call your doctor or health care professional if you notice any unusual bleeding. You may need blood work done while you are taking this medicine. Do not become pregnant while taking this medicine and for 6 months after the last dose. Women should inform their doctor if they wish to become pregnant or think they might be pregnant. Men should not father a child while taking this medicine and for 3 months after the last dose. There is a potential for serious side effects to an unborn child. Talk to your health care professional or pharmacist for more information. Do not breast-feed an infant while taking this medicine and for 1 week after the last dose. This medicine may interfere with   the ability to have a child. Talk with your doctor or health care professional if you are concerned about your fertility. What side effects may I notice from receiving this medicine? Side effects that you should report to your doctor or health care professional as soon as possible:  allergic reactions like skin rash, itching or  hives, swelling of the face, lips, or tongue  low blood counts - this medicine may decrease the number of white blood cells, red blood cells and platelets. You may be at increased risk for infections and bleeding.  signs of infection - fever or chills, cough, sore throat, pain passing urine  signs of decreased platelets or bleeding - bruising, pinpoint red spots on the skin, black, tarry stools, blood in the urine  signs of decreased red blood cells - unusually weak or tired, fainting spells, lightheadedness  signs and symptoms of kidney injury like trouble passing urine or change in the amount of urine  signs and symptoms of liver injury like dark yellow or brown urine; general ill feeling or flu-like symptoms; light-colored stools; loss of appetite; nausea; right upper belly pain; unusually weak or tired; yellowing of the eyes or skin Side effects that usually do not require medical attention (report to your doctor or health care professional if they continue or are bothersome):  constipation  diarrhea  nausea, vomiting  pain or redness at the injection site  unusually weak or tired This list may not describe all possible side effects. Call your doctor for medical advice about side effects. You may report side effects to FDA at 1-800-FDA-1088. Where should I keep my medicine? This drug is given in a hospital or clinic and will not be stored at home. NOTE: This sheet is a summary. It may not cover all possible information. If you have questions about this medicine, talk to your doctor, pharmacist, or health care provider.  2021 Elsevier/Gold Standard (2016-12-27 14:37:51)  

## 2021-05-19 NOTE — Telephone Encounter (Signed)
Dr. Marin Olp notified of HGB-6.8 and platelets-20.  Orders received for pt to get one unit of platelets today and one unit of PRBC's tomorrow per Dr. Marin Olp.

## 2021-05-20 ENCOUNTER — Other Ambulatory Visit: Payer: Self-pay

## 2021-05-20 ENCOUNTER — Inpatient Hospital Stay: Payer: PPO

## 2021-05-20 DIAGNOSIS — D649 Anemia, unspecified: Secondary | ICD-10-CM

## 2021-05-20 DIAGNOSIS — Z5111 Encounter for antineoplastic chemotherapy: Secondary | ICD-10-CM | POA: Diagnosis not present

## 2021-05-20 DIAGNOSIS — D46Z Other myelodysplastic syndromes: Secondary | ICD-10-CM

## 2021-05-20 LAB — PREPARE PLATELET PHERESIS: Unit division: 0

## 2021-05-20 LAB — BPAM PLATELET PHERESIS
Blood Product Expiration Date: 202206102359
ISSUE DATE / TIME: 202206081250
Unit Type and Rh: 5100

## 2021-05-20 MED ORDER — SODIUM CHLORIDE 0.9% FLUSH
10.0000 mL | INTRAVENOUS | Status: AC | PRN
Start: 2021-05-20 — End: 2021-05-20
  Administered 2021-05-20: 10 mL
  Filled 2021-05-20: qty 10

## 2021-05-20 MED ORDER — HEPARIN SOD (PORK) LOCK FLUSH 100 UNIT/ML IV SOLN
500.0000 [IU] | Freq: Every day | INTRAVENOUS | Status: AC | PRN
  Administered 2021-05-20: 500 [IU]
  Filled 2021-05-20: qty 5

## 2021-05-20 MED ORDER — SODIUM CHLORIDE 0.9% IV SOLUTION
250.0000 mL | Freq: Once | INTRAVENOUS | Status: DC
  Filled 2021-05-20: qty 250

## 2021-05-20 NOTE — Patient Instructions (Signed)

## 2021-05-21 LAB — TYPE AND SCREEN
ABO/RH(D): A POS
Antibody Screen: NEGATIVE
Unit division: 0

## 2021-05-21 LAB — BPAM RBC
Blood Product Expiration Date: 202206282359
ISSUE DATE / TIME: 202206090733
Unit Type and Rh: 6200

## 2021-05-24 ENCOUNTER — Telehealth: Payer: Self-pay | Admitting: *Deleted

## 2021-05-24 ENCOUNTER — Inpatient Hospital Stay: Payer: PPO

## 2021-05-24 ENCOUNTER — Other Ambulatory Visit: Payer: Self-pay | Admitting: *Deleted

## 2021-05-24 ENCOUNTER — Other Ambulatory Visit: Payer: Self-pay

## 2021-05-24 VITALS — BP 113/48 | HR 78 | Temp 98.0°F | Resp 18

## 2021-05-24 DIAGNOSIS — D46Z Other myelodysplastic syndromes: Secondary | ICD-10-CM

## 2021-05-24 DIAGNOSIS — D649 Anemia, unspecified: Secondary | ICD-10-CM

## 2021-05-24 DIAGNOSIS — Z5111 Encounter for antineoplastic chemotherapy: Secondary | ICD-10-CM | POA: Diagnosis not present

## 2021-05-24 DIAGNOSIS — D696 Thrombocytopenia, unspecified: Secondary | ICD-10-CM

## 2021-05-24 LAB — BASIC METABOLIC PANEL - CANCER CENTER ONLY
Anion gap: 8 (ref 5–15)
BUN: 21 mg/dL (ref 8–23)
CO2: 22 mmol/L (ref 22–32)
Calcium: 9.4 mg/dL (ref 8.9–10.3)
Chloride: 99 mmol/L (ref 98–111)
Creatinine: 1.37 mg/dL — ABNORMAL HIGH (ref 0.61–1.24)
GFR, Estimated: 55 mL/min — ABNORMAL LOW (ref >60)
Glucose, Bld: 168 mg/dL — ABNORMAL HIGH (ref 70–99)
Potassium: 4.1 mmol/L (ref 3.5–5.1)
Sodium: 129 mmol/L — ABNORMAL LOW (ref 135–145)

## 2021-05-24 LAB — CBC WITH DIFFERENTIAL (CANCER CENTER ONLY)
Abs Immature Granulocytes: 0.25 10*3/uL — ABNORMAL HIGH (ref 0.00–0.07)
Basophils Absolute: 0 10*3/uL (ref 0.0–0.1)
Basophils Relative: 0 %
Eosinophils Absolute: 0 10*3/uL (ref 0.0–0.5)
Eosinophils Relative: 0 %
HCT: 20.2 % — ABNORMAL LOW (ref 39.0–52.0)
Hemoglobin: 6.7 g/dL — CL (ref 13.0–17.0)
Immature Granulocytes: 17 %
Lymphocytes Relative: 28 %
Lymphs Abs: 0.4 10*3/uL — ABNORMAL LOW (ref 0.7–4.0)
MCH: 31.2 pg (ref 26.0–34.0)
MCHC: 33.2 g/dL (ref 30.0–36.0)
MCV: 94 fL (ref 80.0–100.0)
Monocytes Absolute: 0.2 10*3/uL (ref 0.1–1.0)
Monocytes Relative: 14 %
Neutro Abs: 0.6 10*3/uL — ABNORMAL LOW (ref 1.7–7.7)
Neutrophils Relative %: 41 %
Platelet Count: 9 10*3/uL — CL (ref 150–400)
RBC: 2.15 MIL/uL — ABNORMAL LOW (ref 4.22–5.81)
RDW: 18.7 % — ABNORMAL HIGH (ref 11.5–15.5)
WBC Count: 1.5 10*3/uL — ABNORMAL LOW (ref 4.0–10.5)
nRBC: 4.1 % — ABNORMAL HIGH (ref 0.0–0.2)

## 2021-05-24 LAB — PREPARE RBC (CROSSMATCH)

## 2021-05-24 LAB — SAMPLE TO BLOOD BANK

## 2021-05-24 MED ORDER — HEPARIN SOD (PORK) LOCK FLUSH 100 UNIT/ML IV SOLN
500.0000 [IU] | Freq: Every day | INTRAVENOUS | Status: DC | PRN
  Filled 2021-05-24: qty 5

## 2021-05-24 MED ORDER — SODIUM CHLORIDE 0.9% IV SOLUTION
250.0000 mL | Freq: Once | INTRAVENOUS | Status: AC
  Administered 2021-05-24: 250 mL via INTRAVENOUS
  Filled 2021-05-24: qty 250

## 2021-05-24 MED ORDER — CYANOCOBALAMIN 1000 MCG/ML IJ SOLN
INTRAMUSCULAR | Status: AC
  Filled 2021-05-24: qty 1

## 2021-05-24 MED ORDER — HEPARIN SOD (PORK) LOCK FLUSH 100 UNIT/ML IV SOLN
500.0000 [IU] | Freq: Every day | INTRAVENOUS | Status: AC | PRN
  Administered 2021-05-24: 500 [IU]
  Filled 2021-05-24: qty 5

## 2021-05-24 MED ORDER — SODIUM CHLORIDE 0.9% FLUSH
10.0000 mL | INTRAVENOUS | Status: AC | PRN
  Administered 2021-05-24: 10 mL
  Filled 2021-05-24: qty 10

## 2021-05-24 MED ORDER — SODIUM CHLORIDE 0.9% IV SOLUTION
250.0000 mL | Freq: Once | INTRAVENOUS | Status: DC
  Filled 2021-05-24: qty 250

## 2021-05-24 MED ORDER — SODIUM CHLORIDE 0.9% FLUSH
10.0000 mL | INTRAVENOUS | Status: DC | PRN
Start: 2021-05-24 — End: 2021-05-24
  Filled 2021-05-24: qty 10

## 2021-05-24 MED ORDER — SODIUM CHLORIDE 0.9% FLUSH
10.0000 mL | INTRAVENOUS | Status: DC | PRN
  Filled 2021-05-24: qty 10

## 2021-05-24 MED ORDER — HEPARIN SOD (PORK) LOCK FLUSH 100 UNIT/ML IV SOLN
500.0000 [IU] | Freq: Once | INTRAVENOUS | Status: DC
  Filled 2021-05-24: qty 5

## 2021-05-24 NOTE — Patient Instructions (Signed)
Essential Thrombocythemia  Essential thrombocythemia is a condition in which a person has too many platelets (thrombocytes) in the blood. Platelets are parts of blood that stick together and form a clot (thrombus) to help the body stop bleeding after an injury. This condition may also becalled primary or essential thrombocytosis. Essential thrombocythemia happens when abnormal cells in the bone marrow (megakaryocytes) make too many platelets. What are the causes? The cause of this condition is not known. What are the signs or symptoms? This condition may not cause any symptoms. If you have symptoms, they may include: Weakness. Headache. Itching. Sweating. Fever. Dizziness or confusion. Tingling or burning in your hands or feet. Blood clots. Bleeding. Enlarged spleen. How is this diagnosed? This condition may be diagnosed based on: A physical exam. Your symptoms. Your medical history. Blood tests. A procedure to collect a sample of your bone marrow (bone marrow aspiration) for testing. How is this treated? If you do not have symptoms, you may not need treatment. Your health careprovider may monitor your condition with regular blood tests. If you have symptoms, or if your platelet count is very high, you may be treated with: Aspirin or other medicines to thin the blood and prevent blood clots. Medicines to reduce the number of platelets in your blood. A procedure to remove some platelets from your blood (plateletpheresis). During this procedure: Your health care provider will place an IV into one of your veins. The IV will be used to draw blood into a machine that separates out the extra platelets. The blood with reduced platelets will be returned to your body. Follow these instructions at home: Take over-the-counter and prescription medicines only as told by your health care provider. If you are taking blood thinners: Talk with your health care provider before you take any  medicines that contain aspirin or NSAIDs. These medicines increase your risk for dangerous bleeding. Take your medicine exactly as told, at the same time every day. Avoid activities that could cause injury or bruising, and follow instructions about how to prevent falls. Wear a medical alert bracelet or carry a card that lists what medicines you take. Tell all health care providers, including dentists, about any medicines you are taking to prevent blood clots. Do not use any products that contain nicotine or tobacco, such as cigarettes and e-cigarettes. If you need help quitting, ask your health care provider. Ask your health care provider about managing or preventing high cholesterol, high blood pressure, and diabetes. These conditions can make essential thrombocythemia worse. Keep all follow-up visits as told by your health care provider. This is important. Contact a health care provider if: You have severe pain, and medicines do not help. You have problems taking your medicines to prevent blood clots. You faint. Get help right away if:  You have bleeding or blood clots. You have unusual bruises. You have bloody or tarry stools. You have pink or bloody urine. Your menstrual periods are heavier than normal, if applicable. You have nosebleeds and bleeding gums. You have chest pain. You have trouble breathing. You have any symptoms of a stroke. "BE FAST" is an easy way to remember the main warning signs of a stroke: B - Balance. Signs are dizziness, sudden trouble walking, or loss of balance. E - Eyes. Signs are trouble seeing or a sudden change in vision. F - Face. Signs are sudden weakness or numbness of the face, or the face or eyelid drooping on one side. A - Arm. Signs are weakness or numbness  in an arm. This happens suddenly and usually on one side of the body. S - Speech. Signs are sudden trouble speaking, slurred speech, or trouble understanding what people say. T - Time. Time to  call emergency services. Write down what time symptoms started. You have other signs of a stroke, such as: A sudden, severe headache with no known cause. Nausea or vomiting. Seizure. These symptoms may represent a serious problem that is an emergency. Do not wait to see if the symptoms will go away. Get medical help right away. Call your local emergency services (911 in the [place] Do not drive yourself to the hospital. Summary Essential thrombocythemia happens when abnormal cells in the bone marrow make too many platelets. If you have symptoms, or if your platelet count is very high, you may need treatment. Treatment can vary and may include medicines to thin the blood and prevent blood clots. Ask your health care provider about how to manage or prevent high cholesterol, high blood pressure, and diabetes. These conditions can make essential thrombocythemia worse. Get help right away if you have any symptoms of stroke. This information is not intended to replace advice given to you by your health care provider. Make sure you discuss any questions you have with your healthcare provider. Document Revised: 05/13/2020 Document Reviewed: 05/13/2020 Elsevier Patient Education  2022 [employer].

## 2021-05-24 NOTE — Patient Instructions (Signed)

## 2021-05-24 NOTE — Telephone Encounter (Signed)
Dr. Marin Olp notified of HGB-6.7 and platelet count-9.  Orders received for pt to get one unit of platelets and one unit of PRBC's.  Pt is requesting to get platelets today and PRBC's tomorrow.  Message sent to scheduling. Dr. Marin Olp notified.

## 2021-05-25 ENCOUNTER — Inpatient Hospital Stay: Payer: PPO

## 2021-05-25 DIAGNOSIS — D696 Thrombocytopenia, unspecified: Secondary | ICD-10-CM

## 2021-05-25 DIAGNOSIS — Z5111 Encounter for antineoplastic chemotherapy: Secondary | ICD-10-CM | POA: Diagnosis not present

## 2021-05-25 DIAGNOSIS — D46Z Other myelodysplastic syndromes: Secondary | ICD-10-CM

## 2021-05-25 DIAGNOSIS — D649 Anemia, unspecified: Secondary | ICD-10-CM

## 2021-05-25 LAB — BPAM PLATELET PHERESIS
Blood Product Expiration Date: 202206162359
ISSUE DATE / TIME: 202206131056
Unit Type and Rh: 7300

## 2021-05-25 LAB — PREPARE PLATELET PHERESIS: Unit division: 0

## 2021-05-25 MED ORDER — SODIUM CHLORIDE 0.9% IV SOLUTION
250.0000 mL | Freq: Once | INTRAVENOUS | Status: AC
  Administered 2021-05-25: 250 mL via INTRAVENOUS
  Filled 2021-05-25: qty 250

## 2021-05-25 MED ORDER — HEPARIN SOD (PORK) LOCK FLUSH 100 UNIT/ML IV SOLN
500.0000 [IU] | Freq: Every day | INTRAVENOUS | Status: AC | PRN
  Administered 2021-05-25: 500 [IU]
  Filled 2021-05-25: qty 5

## 2021-05-25 MED ORDER — SODIUM CHLORIDE 0.9% FLUSH
10.0000 mL | INTRAVENOUS | Status: AC | PRN
Start: 2021-05-25 — End: 2021-05-25
  Administered 2021-05-25: 10 mL
  Filled 2021-05-25: qty 10

## 2021-05-25 NOTE — Patient Instructions (Signed)
[employer] medicine (25th ed., pp. 1059-1068). [place], [place]: Elsevier.">  Anemia  Anemia is a condition in which there is not enough red blood cells or hemoglobin in the blood. Hemoglobin is a substance in red blood cells thatcarries oxygen. When you do not have enough red blood cells or hemoglobin (are anemic), your body cannot get enough oxygen and your organs may not work properly. Asa result, you may feel very tired or have other problems. What are the causes? Common causes of anemia include: Excessive bleeding. Anemia can be caused by excessive bleeding inside or outside the body, including bleeding from the intestines or from heavy menstrual periods in females. Poor nutrition. Long-lasting (chronic) kidney, thyroid, and liver disease. Bone marrow disorders, spleen problems, and blood disorders. Cancer and treatments for cancer. HIV (human immunodeficiency virus) and AIDS (acquired immunodeficiency syndrome). Infections, medicines, and autoimmune disorders that destroy red blood cells. What are the signs or symptoms? Symptoms of this condition include: Minor weakness. Dizziness. Headache, or difficulties concentrating and sleeping. Heartbeats that feel irregular or faster than normal (palpitations). Shortness of breath, especially with exercise. Pale skin, lips, and nails, or cold hands and feet. Indigestion and nausea. Symptoms may occur suddenly or develop slowly. If your anemia is mild, you maynot have symptoms. How is this diagnosed? This condition is diagnosed based on blood tests, your medical history, and a physical exam. In some cases, a test may be needed in which cells are removed from the soft tissue inside of a bone and looked at under a microscope (bone marrow biopsy). Your health care provider may also check your stool (feces) for blood and may do additional testing to look for the cause of yourbleeding. Other tests may include: Imaging tests, such as a CT scan or  MRI. A procedure to see inside your esophagus and stomach (endoscopy). A procedure to see inside your colon and rectum (colonoscopy). How is this treated? Treatment for this condition depends on the cause. If you continue to lose a lot of blood, you may need to be treated at a hospital. Treatment may include: Taking supplements of iron, vitamin B12, or folic acid. Taking a hormone medicine (erythropoietin) that can help to stimulate red blood cell growth. Having a blood transfusion. This may be needed if you lose a lot of blood. Making changes to your diet. Having surgery to remove your spleen. Follow these instructions at home: Take over-the-counter and prescription medicines only as told by your health care provider. Take supplements only as told by your health care provider. Follow any diet instructions that you were given by your health care provider. Keep all follow-up visits as told by your health care provider. This is important. Contact a health care provider if: You develop new bleeding anywhere in the body. Get help right away if: You are very weak. You are short of breath. You have pain in your abdomen or chest. You are dizzy or feel faint. You have trouble concentrating. You have bloody stools, black stools, or tarry stools. You vomit repeatedly or you vomit up blood. These symptoms may represent a serious problem that is an emergency. Do not wait to see if the symptoms will go away. Get medical help right away. Call your local emergency services (911 in the [place] Do not drive yourself to the hospital. Summary Anemia is a condition in which you do not have enough red blood cells or enough of a substance in your red blood cells that carries oxygen (hemoglobin). Symptoms may occur suddenly   or develop slowly. If your anemia is mild, you may not have symptoms. This condition is diagnosed with blood tests, a medical history, and a physical exam. Other tests may be  needed. Treatment for this condition depends on the cause of the anemia. This information is not intended to replace advice given to you by your health care provider. Make sure you discuss any questions you have with your healthcare provider. Document Revised: 11/05/2019 Document Reviewed: 11/05/2019 Elsevier Patient Education  2022 [employer].

## 2021-05-26 ENCOUNTER — Other Ambulatory Visit: Payer: Self-pay

## 2021-05-26 ENCOUNTER — Encounter: Payer: Self-pay | Admitting: Physical Therapy

## 2021-05-26 ENCOUNTER — Ambulatory Visit: Payer: PPO | Attending: Hematology & Oncology | Admitting: Physical Therapy

## 2021-05-26 DIAGNOSIS — M6281 Muscle weakness (generalized): Secondary | ICD-10-CM | POA: Diagnosis not present

## 2021-05-26 DIAGNOSIS — R2681 Unsteadiness on feet: Secondary | ICD-10-CM | POA: Diagnosis not present

## 2021-05-26 LAB — TYPE AND SCREEN
ABO/RH(D): A POS
Antibody Screen: NEGATIVE
Unit division: 0

## 2021-05-26 LAB — BPAM RBC
Blood Product Expiration Date: 202207032359
ISSUE DATE / TIME: 202206140758
Unit Type and Rh: 6200

## 2021-05-26 NOTE — Therapy (Signed)
[facility] [address]  [place], [place], [address] Phone: [phone]   Fax:  [phone]  Physical Therapy Evaluation  Patient Details  Name: Theodore Jenkins MRN: [id number] Date of Birth: 1946-04-08 Referring Provider (PT): Burney Gauze, MD   Encounter Date: 05/26/2021   PT End of Session - 05/26/21 1147     Visit Number 1    Number of Visits 17    Date for PT Re-Evaluation 07/21/21    Authorization Type [employer]    PT Start Time 1009    PT Stop Time 1054    PT Time Calculation (min) 45 min    Activity Tolerance Patient tolerated treatment well    Behavior During Therapy [facility] for tasks assessed/performed             Past Medical History:  Diagnosis Date   Aortic stenosis    Echo (08/2017): Severe AS with mean gradient of 42 mmHg.   Barrett's esophagus    CAD (coronary artery disease) 06/06/2011   Remote stent to the LAD in 2005 // Last cath in 2010 showing diffuse CAD, small PD that is occluded and fills with left to right collaterals, 60-70% 1st OM, with normal EF. Managed medically // Myoview 3/18:  Normal perfusion. LVEF 55% with normal wall motion. This is a low risk study.   Complication of anesthesia    Diabetes mellitus    ED (erectile dysfunction)    Family history of adverse reaction to anesthesia 10/23/2017   sister had history of malignant hyperthermia when she was 16, she is 73 years old now   GERD (gastroesophageal reflux disease)    Goals of care, counseling/discussion 12/22/2020   Hyperlipidemia    Hypertension    IHD (ischemic heart disease)    prior stenting of the LAD in December of 2005; with last cath in 2010 showing diffuse CAD with normal LV function. He is managed medically.    Malignant hyperthermia    sister had reaction concerning for malignant hyperthermia (~ 1988)   MDS (myelodysplastic syndrome), high grade ([place]) 12/22/2020   Neuromuscular disorder ([place]) 10/23/2017    lumbar 4 disc in back causing nerve pain, on gabapentin   Obesity    Obstructive sleep apnea on CPAP    PONV (postoperative nausea and vomiting)    S/P aortic valve replacement with bioprosthetic valve 10/26/2017   25 mm [facility] Ease bovine pericardial tissue valve   S/P coronary artery stent placement 2005   LAD    Past Surgical History:  Procedure Laterality Date   AORTIC VALVE REPLACEMENT N/A 10/26/2017   Procedure: AORTIC VALVE REPLACEMENT (AVR), using Magna Ease 25;  Surgeon: Rexene Alberts, MD;  Location: [place];  Service: Open Heart Surgery;  Laterality: N/A;   CARDIAC CATHETERIZATION  2010   NORMAL LV FUNCTION. DIFFUSE CORONARY DISEASE WITH THE DISTAL PORTION OF THE SMALL POSTERIOR DESCENDING VESSEL OCCLUDED AND FILLING WITH THE RIGHT COLLATERALS, 60-70% STENOSIS IN THE FIRST OBTUSE MARGINAL VESSEL AND DIFFUSE SMALL VESSEL DISEASE   CAROTID ENDARTERECTOMY     CIRCUMCISION  1985   CORONARY ARTERY BYPASS GRAFT N/A 10/26/2017   Procedure: CORONARY ARTERY BYPASS GRAFTING (CABG) x two , using left internal mammary artery and right leg greater saphenous vein harvested endoscopically;  Surgeon: Rexene Alberts, MD;  Location: [place];  Service: Open Heart Surgery;  Laterality: N/A;   CORONARY STENT PLACEMENT  2005   IR IMAGING GUIDED PORT INSERTION  12/28/2020   RIGHT/LEFT HEART CATH AND CORONARY ANGIOGRAPHY N/A 08/25/2017   Procedure: RIGHT/LEFT HEART CATH AND CORONARY ANGIOGRAPHY;  Surgeon: Nelva Bush, MD;  Location: [place] CV LAB;  Service: Cardiovascular;  Laterality: N/A;   rotator cuff surgery Right    TEE WITHOUT CARDIOVERSION N/A 10/26/2017   Procedure: TRANSESOPHAGEAL ECHOCARDIOGRAM (TEE);  Surgeon: Rexene Alberts, MD;  Location: [place];  Service: Open Heart Surgery;  Laterality: N/A;   TEE WITHOUT CARDIOVERSION N/A 05/01/2020   Procedure: TRANSESOPHAGEAL ECHOCARDIOGRAM (TEE);  Surgeon: Buford Dresser, MD;  Location: [facility] ENDOSCOPY;  Service: Cardiovascular;   Laterality: N/A;    There were no vitals filed for this visit.    Subjective Assessment - 05/26/21 1011     Subjective Patient reports issues with balance. Notes that he was put on a steroid which raised his blood sugar and feels that this contributed to imbalance, but once his meds were adjusted, he started doing better. Now no longer using walker and recently now only using SPC when outside. Unable to recall specific activities that cause imbalance and denies dizziness. Notes 6 falls in the last 6 months, none since being taken off steroids. Notes low energy levels d/t chemo. Notes that his legs give out on him after walking for longer periods. Patient mentions L hand weakness and trouble with gripping for 20 years.    Patient is accompained by: Family member   wife   Pertinent History CAD, DM, GERD, HLD, HTN, myelodysplastic syndrome, CABG x2 2018, R RTC repair    Limitations Lifting;Standing;Walking;House hold activities    How long can you walk comfortably? 200 yards    Diagnostic tests none recent    Patient Stated Goals improve balance and strength    Currently in Pain? No/denies                [facility] PT Assessment - 05/26/21 1017       Assessment   Medical Diagnosis myelodysplastic syndrome, high grade    Referring Provider (PT) Burney Gauze, MD    Onset Date/Surgical Date 12/05/21    Next MD Visit 06/08/21    Prior Therapy yes      Precautions   Precautions --   low platelets; currently on chemo- tablet and infusion; R port a cath     Balance Screen   Has the patient fallen in the past 6 months Yes    How many times? 6    Has the patient had a decrease in activity level because of a fear of falling?  No    Is the patient reluctant to leave their home because of a fear of falling?  No      Home Social worker Private residence    Living Arrangements Spouse/significant other    Available Help at Discharge Family    Type of [place] to enter    Entrance Stairs-Number of Steps 4+1    Entrance Stairs-Rails Right;Left   back door only   Home Layout Laundry or work area in basement   13 steps   [employer] - single point;Walker - 4 wheels;Wheelchair - manual;Shower seat;Grab bars - tub/shower      Prior Function   Level of Independence Independent    Vocation Retired    Leisure workout, get in the pool, driving      Cognition   Overall Cognitive Status Within Functional Limits for tasks assessed  Observation/Other Assessments   Observations brusing over B arms, L upper arm bleeding      Sensation   Light Touch Appears Intact      Coordination   Gross Motor Movements are Fluid and Coordinated Yes      Posture/Postural Control   Posture/Postural Control Postural limitations    Postural Limitations Rounded Shoulders;Forward head      ROM / Strength   AROM / PROM / Strength AROM;Strength      Strength   Strength Assessment Site Hip;Knee;Ankle    Right/Left Hip Right;Left    Right Hip Flexion 4/5    Right Hip ABduction 4+/5    Right Hip ADduction 4/5    Left Hip Flexion 4/5    Left Hip ABduction 4+/5    Left Hip ADduction 4/5    Right/Left Knee Right;Left    Right Knee Flexion 4/5    Right Knee Extension 4/5    Left Knee Flexion 4/5    Left Knee Extension 4+/5    Right/Left Ankle Right;Left    Right Ankle Dorsiflexion 4+/5    Right Ankle Plantar Flexion 4/5    Left Ankle Dorsiflexion 4+/5    Left Ankle Plantar Flexion 4/5      Ambulation/Gait   Gait Pattern Step-through pattern;Trunk flexed    Ambulation Surface Level;Indoor    Gait velocity slightly decreased      Standardized Balance Assessment   Standardized Balance Assessment Berg Balance Test;Five Times Sit to Stand    Five times sit to stand comments  20.10 sec   no UEs     Berg Balance Test   Sit to Stand Able to stand without using hands and stabilize independently    Standing Unsupported Able to stand safely  2 minutes    Sitting with Back Unsupported but Feet Supported on Floor or Stool Able to sit safely and securely 2 minutes    Stand to Sit Controls descent by using hands    Transfers Able to transfer safely, minor use of hands    Standing Unsupported with Eyes Closed Able to stand 10 seconds safely    Standing Unsupported with Feet Together Able to place feet together independently and stand 1 minute safely    From Standing, Reach Forward with Outstretched Arm Can reach confidently >25 cm (10")    From Standing Position, Pick up Object from Floor Able to pick up shoe, needs supervision    From Standing Position, Turn to Look Behind Over each Shoulder Looks behind from both sides and weight shifts well    Turn 360 Degrees Able to turn 360 degrees safely but slowly    Standing Unsupported, Alternately Place Feet on Step/Stool Able to stand independently and safely and complete 8 steps in 20 seconds    Standing Unsupported, One Foot in [employer] balance while stepping or standing    Standing on One Leg Unable to try or needs assist to prevent fall    Total Score 44                        Objective measurements completed on examination: See above findings.               PT Education - 05/26/21 1147     Education Details prognosis, POC, HEP    Person(s) Educated Patient;Spouse   wife   Methods Explanation;Demonstration;Tactile cues;Verbal cues;Handout    Comprehension Verbalized understanding;Returned demonstration  PT Short Term Goals - 05/26/21 1201       PT SHORT TERM GOAL #1   Title Patient to be independent with initial HEP.    Time 3    Period Weeks    Status New    Target Date 06/16/21               PT Long Term Goals - 05/26/21 1202       PT LONG TERM GOAL #1   Title Patient to be independent with advanced HEP.    Time 8    Period Weeks    Status New    Target Date 07/21/21      PT LONG TERM GOAL #2   Title Patient  to demonstrate B LE strength >/=4+/5.    Time 8    Period Weeks    Status New    Target Date 07/21/21      PT LONG TERM GOAL #3   Title Patient to score atleast 48/56 on Berg in order to decrease risk of falls.    Time 8    Period Weeks    Status New    Target Date 07/21/21      PT LONG TERM GOAL #4   Title Patient to perform 5xSTS without UEs in < 15 sec in order to decrease risk of falls.    Time 8    Period Weeks    Status New    Target Date 07/21/21                    Plan - 05/26/21 1150     Clinical Impression Statement Patient is a 75 y/o M presenting to OPPT with wife with c/o imbalance and weakness for the past 6 months since starting chemo treatment for myelodysplastic syndrome. Patient ambulates with SPC while outside, recently no AD while inside. Notes 6 falls in the past 6 months, however none since having had a change in medications. Patient reports low energy levels d/t chemo treatment and reports B LEs "give out" on him during longer durations of walking. Patient today presenting with rounded shoulders and forward head posture, B LE weakness, gait deviations, and imbalance. Patient's score on Berg and 5xSTS indicates an increased risk of falls. Patient was educated on gentle balance and strengthening HEP- patient reported understanding. Would benefit from skilled PT services 1-2x/week for 8 weeks to address aforementioned impairments.    Personal Factors and Comorbidities Age;Comorbidity 3+;Fitness;Past/Current Experience;Time since onset of injury/illness/exacerbation    Comorbidities CAD, DM, GERD, HLD, HTN, myelodysplastic syndrome, CABG x2 2018, R RTC repair    Examination-Activity Limitations Bend;Squat;Stairs;Carry;Stand;Dressing;Transfers;Hygiene/Grooming;Lift;Locomotion Level;Reach Overhead    Examination-Participation Restrictions Church;Cleaning;Shop;Community Activity;Driving;Laundry;Meal Prep    Stability/Clinical Decision Making Stable/Uncomplicated     Clinical Decision Making Low    Rehab Potential Good    PT Frequency Other (comment)   1-2x/week   PT Duration 8 weeks    PT Treatment/Interventions ADLs/Self Care Home Management;Cryotherapy;Electrical Stimulation;Moist Heat;Balance training;Therapeutic exercise;Therapeutic activities;Functional mobility training;Stair training;Gait training;Neuromuscular re-education;Patient/family education;Manual techniques;Taping;Energy conservation;Dry needling;Passive range of motion    PT Next Visit Plan reassess HEP; progress LE strength and dynamic balance    Consulted and Agree with Plan of Care Patient;Family member/caregiver    Family Member Consulted wife             Patient will benefit from skilled therapeutic intervention in order to improve the following deficits and impairments:  Abnormal gait, Decreased endurance, Decreased activity tolerance, Decreased strength, Increased fascial restricitons,  Pain, Impaired UE functional use, Decreased balance, Difficulty walking, Improper body mechanics, Decreased range of motion, Postural dysfunction, Impaired flexibility  Visit Diagnosis: Unsteadiness on feet  Muscle weakness (generalized)     Problem List Patient Active Problem List   Diagnosis Date Noted   MDS (myelodysplastic syndrome), high grade ([place]) 12/22/2020   Goals of care, counseling/discussion 12/22/2020   Pancytopenia ([place]) 12/10/2020   AKI (acute kidney injury) ([place]) 12/10/2020   Acute bacterial endocarditis    Hyponatremia 04/30/2020   Bacteremia 04/29/2020   S/P AVR (aortic valve replacement) 02/15/2018   S/P aortic valve replacement with bioprosthetic valve  10/26/2017   S/P CABG x 2 10/26/2017   Type 2 diabetes mellitus with complication, without long-term current use of insulin (HCC)    Accelerating angina ([place]) 08/18/2017   Carotid artery disease, unspecified laterality ([place]) 08/08/2016   Exertional chest pain 10/03/2014   Aortic valve stenosis 05/03/2014    ED (erectile dysfunction) 03/20/2012   Coronary artery disease involving native coronary artery of native heart without angina pectoris 06/06/2011   Hyperlipidemia LDL goal <70 06/06/2011   Essential hypertension 06/06/2011   Obesities, morbid ([place]) 06/06/2011     Janene Harvey, PT, DPT 05/26/21 12:06 PM    [facility] [address]  [place], [place], [address] Phone: [phone]   Fax:  [phone]  Name: Theodore Jenkins MRN: [id number] Date of Birth: [birth year]

## 2021-05-30 ENCOUNTER — Other Ambulatory Visit: Payer: Self-pay | Admitting: Cardiovascular Disease

## 2021-05-31 ENCOUNTER — Inpatient Hospital Stay: Payer: PPO

## 2021-05-31 ENCOUNTER — Other Ambulatory Visit: Payer: Self-pay | Admitting: *Deleted

## 2021-05-31 ENCOUNTER — Other Ambulatory Visit: Payer: Self-pay

## 2021-05-31 ENCOUNTER — Telehealth: Payer: Self-pay | Admitting: *Deleted

## 2021-05-31 VITALS — BP 120/47 | HR 72 | Temp 98.1°F | Resp 16

## 2021-05-31 DIAGNOSIS — D696 Thrombocytopenia, unspecified: Secondary | ICD-10-CM

## 2021-05-31 DIAGNOSIS — D649 Anemia, unspecified: Secondary | ICD-10-CM

## 2021-05-31 DIAGNOSIS — D46Z Other myelodysplastic syndromes: Secondary | ICD-10-CM

## 2021-05-31 DIAGNOSIS — Z5111 Encounter for antineoplastic chemotherapy: Secondary | ICD-10-CM | POA: Diagnosis not present

## 2021-05-31 LAB — CBC WITH DIFFERENTIAL (CANCER CENTER ONLY)
Abs Immature Granulocytes: 0.11 10*3/uL — ABNORMAL HIGH (ref 0.00–0.07)
Basophils Absolute: 0 10*3/uL (ref 0.0–0.1)
Basophils Relative: 0 %
Eosinophils Absolute: 0 10*3/uL (ref 0.0–0.5)
Eosinophils Relative: 0 %
HCT: 18.5 % — ABNORMAL LOW (ref 39.0–52.0)
Hemoglobin: 6.1 g/dL — CL (ref 13.0–17.0)
Immature Granulocytes: 11 %
Lymphocytes Relative: 25 %
Lymphs Abs: 0.3 10*3/uL — ABNORMAL LOW (ref 0.7–4.0)
MCH: 31.4 pg (ref 26.0–34.0)
MCHC: 33 g/dL (ref 30.0–36.0)
MCV: 95.4 fL (ref 80.0–100.0)
Monocytes Absolute: 0.1 10*3/uL (ref 0.1–1.0)
Monocytes Relative: 9 %
Neutro Abs: 0.6 10*3/uL — ABNORMAL LOW (ref 1.7–7.7)
Neutrophils Relative %: 55 %
Platelet Count: 7 10*3/uL — CL (ref 150–400)
RBC: 1.94 MIL/uL — ABNORMAL LOW (ref 4.22–5.81)
RDW: 18.4 % — ABNORMAL HIGH (ref 11.5–15.5)
WBC Count: 1 10*3/uL — ABNORMAL LOW (ref 4.0–10.5)
nRBC: 4 % — ABNORMAL HIGH (ref 0.0–0.2)

## 2021-05-31 LAB — BASIC METABOLIC PANEL - CANCER CENTER ONLY
Anion gap: 10 (ref 5–15)
BUN: 25 mg/dL — ABNORMAL HIGH (ref 8–23)
CO2: 20 mmol/L — ABNORMAL LOW (ref 22–32)
Calcium: 9.3 mg/dL (ref 8.9–10.3)
Chloride: 98 mmol/L (ref 98–111)
Creatinine: 1.42 mg/dL — ABNORMAL HIGH (ref 0.61–1.24)
GFR, Estimated: 53 mL/min — ABNORMAL LOW (ref >60)
Glucose, Bld: 228 mg/dL — ABNORMAL HIGH (ref 70–99)
Potassium: 4.3 mmol/L (ref 3.5–5.1)
Sodium: 128 mmol/L — ABNORMAL LOW (ref 135–145)

## 2021-05-31 LAB — SAMPLE TO BLOOD BANK

## 2021-05-31 LAB — PREPARE RBC (CROSSMATCH)

## 2021-05-31 MED ORDER — DIPHENHYDRAMINE HCL 25 MG PO CAPS
25.0000 mg | ORAL_CAPSULE | Freq: Once | ORAL | Status: AC
Start: 2021-05-31 — End: 2021-05-31
  Administered 2021-05-31: 25 mg via ORAL

## 2021-05-31 MED ORDER — ACETAMINOPHEN 325 MG PO TABS
ORAL_TABLET | ORAL | Status: AC
  Filled 2021-05-31: qty 2

## 2021-05-31 MED ORDER — ACETAMINOPHEN 325 MG PO TABS
650.0000 mg | ORAL_TABLET | Freq: Once | ORAL | Status: AC
Start: 2021-05-31 — End: 2021-05-31
  Administered 2021-05-31: 650 mg via ORAL

## 2021-05-31 MED ORDER — DIPHENHYDRAMINE HCL 25 MG PO CAPS
ORAL_CAPSULE | ORAL | Status: AC
  Filled 2021-05-31: qty 1

## 2021-05-31 MED ORDER — SODIUM CHLORIDE 0.9% FLUSH
10.0000 mL | INTRAVENOUS | Status: AC | PRN
  Administered 2021-05-31: 10 mL
  Filled 2021-05-31: qty 10

## 2021-05-31 MED ORDER — SODIUM CHLORIDE 0.9% IV SOLUTION
250.0000 mL | Freq: Once | INTRAVENOUS | Status: DC
  Filled 2021-05-31: qty 250

## 2021-05-31 MED ORDER — SODIUM CHLORIDE 0.9% IV SOLUTION
250.0000 mL | Freq: Once | INTRAVENOUS | Status: AC
  Administered 2021-05-31: 250 mL via INTRAVENOUS
  Filled 2021-05-31: qty 250

## 2021-05-31 MED ORDER — HEPARIN SOD (PORK) LOCK FLUSH 100 UNIT/ML IV SOLN
500.0000 [IU] | Freq: Every day | INTRAVENOUS | Status: AC | PRN
  Administered 2021-05-31: 500 [IU]
  Filled 2021-05-31: qty 5

## 2021-05-31 NOTE — Patient Instructions (Signed)
https://www.[facility].org/donate-blood/blood-donation-process/what-happens-to-donated-blood/blood-transfusions/types-of-[email or web address].html"> https://www.hematology.org/education/patients/blood-basics/blood-safety-and-matching"> https://www.nhlbi.nih.gov/health-topics/blood-transfusion">  Blood Transfusion, Adult A blood transfusion is a procedure in which you receive blood or a type of blood cell (blood component) through an IV. You may need a blood transfusion when your blood level is low. This may result from a bleeding disorder, illness, injury, or surgery. The blood may come from a donor. You may also be able to donate blood for yourself (autologous blood donation) before a planned surgery. The blood given in a transfusion is made up of different blood components. You may receive: Red blood cells. These carry oxygen to the cells in the body. Platelets. These help your blood to clot. Plasma. This is the liquid part of your blood. It carries proteins and other substances throughout the body. White blood cells. These help you fight infections. If you have hemophilia or another clotting disorder, you may also receive othertypes of blood products. Tell a health care provider about: Any blood disorders you have. Any previous reactions you have had during a blood transfusion. Any allergies you have. All medicines you are taking, including vitamins, herbs, eye drops, creams, and over-the-counter medicines. Any surgeries you have had. Any medical conditions you have, including any recent fever or cold symptoms. Whether you are pregnant or may be pregnant. What are the risks? Generally, this is a safe procedure. However, problems may occur. The most common problems include: A mild allergic reaction, such as red, swollen areas of skin (hives) and itching. Fever or chills. This may be the body's response to new blood cells received. This may occur during or up to 4 hours after the  transfusion. More serious problems may include: Transfusion-associated circulatory overload (TACO), or too much fluid in the lungs. This may cause breathing problems. A serious allergic reaction, such as difficulty breathing or swelling around the face and lips. Transfusion-related acute lung injury (TRALI), which causes breathing difficulty and low oxygen in the blood. This can occur within hours of the transfusion or several days later. Iron overload. This can happen after receiving many blood transfusions over a period of time. Infection or virus being transmitted. This is rare because donated blood is carefully tested before it is given. Hemolytic transfusion reaction. This is rare. It happens when your body's defense system (immune system)tries to attack the new blood cells. Symptoms may include fever, chills, nausea, low blood pressure, and low back or chest pain. Transfusion-associated graft-versus-host disease (TAGVHD). This is rare. It happens when donated cells attack your body's healthy tissues. What happens before the procedure? Medicines Ask your health care provider about: Changing or stopping your regular medicines. This is especially important if you are taking diabetes medicines or blood thinners. Taking medicines such as aspirin and ibuprofen. These medicines can thin your blood. Do not take these medicines unless your health care provider tells you to take them. Taking over-the-counter medicines, vitamins, herbs, and supplements. General instructions Follow instructions from your health care provider about eating and drinking restrictions. You will have a blood test to determine your blood type. This is necessary to know what kind of blood your body will accept and to match it to the donor blood. If you are going to have a planned surgery, you may be able to do an autologous blood donation. This may be done in case you need to have a transfusion. You will have your temperature,  blood pressure, and pulse monitored before the transfusion. If you have had an allergic reaction to a transfusion in the past, you may be given   medicine to help prevent a reaction. This medicine may be given to you by mouth (orally) or through an IV. Set aside time for the blood transfusion. This procedure generally takes 1-4 hours to complete. What happens during the procedure?  An IV will be inserted into one of your veins. The bag of donated blood will be attached to your IV. The blood will then enter through your vein. Your temperature, blood pressure, and pulse will be monitored regularly during the transfusion. This monitoring is done to detect early signs of a transfusion reaction. Tell your nurse right away if you have any of these symptoms during the transfusion: Shortness of breath or trouble breathing. Chest or back pain. Fever or chills. Hives or itching. If you have any signs or symptoms of a reaction, your transfusion will be stopped and you may be given medicine. When the transfusion is complete, your IV will be removed. Pressure may be applied to the IV site for a few minutes. A bandage (dressing)will be applied. The procedure may vary among health care providers and hospitals. What happens after the procedure? Your temperature, blood pressure, pulse, breathing rate, and blood oxygen level will be monitored until you leave the hospital or clinic. Your blood may be tested to see how you are responding to the transfusion. You may be warmed with fluids or blankets to maintain a normal body temperature. If you receive your blood transfusion in an outpatient setting, you will be told whom to contact to report any reactions. Where to find more information For more information on blood transfusions, visit the [employer]: redcross.org Summary A blood transfusion is a procedure in which you receive blood or a type of blood cell (blood component) through an IV. The blood you  receive may come from a donor or be donated by yourself (autologous blood donation) before a planned surgery. The blood given in a transfusion is made up of different blood components. You may receive red blood cells, platelets, plasma, or white blood cells depending on the condition treated. Your temperature, blood pressure, and pulse will be monitored before, during, and after the transfusion. After the transfusion, your blood may be tested to see how your body has responded. This information is not intended to replace advice given to you by your health care provider. Make sure you discuss any questions you have with your healthcare provider. Document Revised: 10/03/2019 Document Reviewed: 05/23/2019 Elsevier Patient Education  [place].  Platelet Transfusion A platelet transfusion is a procedure in which you receive donated platelets through an IV. Platelets are tiny pieces of blood cells. When you get an injury, platelets clump together in the area to form a blood clot. This helps stop bleeding and is the beginning of the healing process. If you have too few platelets, your blood may have trouble clotting. This may cause you to bleedand bruise very easily. You may need a platelet transfusion if you have a condition that causes a low number of platelets (thrombocytopenia). A platelet transfusion may be used to stop or prevent excessive bleeding. Tell a health care provider about: Any reactions you have had during previous transfusions. Any allergies you have. All medicines you are taking, including vitamins, herbs, eye drops, creams, and over-the-counter medicines. Any blood disorders you have. Any surgeries you have had. Any medical conditions you have. Whether you are pregnant or may be pregnant. What are the risks? Generally, this is a safe procedure. However, problems may occur, including: Fever. Infection. Allergic  reaction to the donor platelets. Your body's  disease-fighting system (immune system) attacking the donor platelets (hemolytic reaction). This is rare. A rare reaction that causes lung damage (transfusion-related acute lung injury). What happens before the procedure? Medicines Ask your health care provider about: Changing or stopping your regular medicines. This is especially important if you are taking diabetes medicines or blood thinners. Taking medicines such as aspirin and ibuprofen. These medicines can thin your blood. Do not take these medicines unless your health care provider tells you to take them. Taking over-the-counter medicines, vitamins, herbs, and supplements. General instructions You will have a blood test to determine your blood type. Your blood type determines what kind of platelets you will be given. Follow instructions from your health care provider about eating or drinking restrictions. If you have had an allergic reaction to a transfusion in the past, you may be given medicine to help prevent a reaction. Your temperature, blood pressure, pulse, and breathing will be monitored. What happens during the procedure?  An IV will be inserted into one of your veins. For your safety, two health care providers will verify your identity along with the donor platelets about to be infused. A bag of donor platelets will be connected to your IV. The platelets will flow into your bloodstream. This usually takes 30-60 minutes. Your temperature, blood pressure, pulse, and breathing will be monitored during the transfusion. This helps detect early signs of any reaction. You will also be monitored for other symptoms that may indicate a reaction, including chills, hives, or itching. If you have signs of a reaction at any time, your transfusion will be stopped, and you may be given medicine to help manage the reaction. When your transfusion is complete, your IV will be removed. Pressure may be applied to the IV site for a few minutes to  stop any bleeding. The IV site will be covered with a bandage (dressing). The procedure may vary among health care providers and hospitals. What happens after the procedure? Your blood pressure, temperature, pulse, and breathing will be monitored until you leave the hospital or clinic. You may have some bruising and soreness at your IV site. Follow these instructions at home: Medicines Take over-the-counter and prescription medicines only as told by your health care provider. Talk with your health care provider before you take any medicines that contain aspirin or NSAIDs. These medicines increase your risk for dangerous bleeding. General instructions Change or remove your dressing as told by your health care provider. Return to your normal activities as told by your health care provider. Ask your health care provider what activities are safe for you. Do not take baths, swim, or use a hot tub until your health care provider approves. Ask your health care provider if you may take showers. Check your IV site every day for signs of infection. Check for: Redness, swelling, or pain. Fluid or blood. If fluid or blood drains from your IV site, use your hands to press down firmly on a bandage covering the area for a minute or two. Doing this should stop the bleeding. Warmth. Pus or a bad smell. Keep all follow-up visits as told by your health care provider. This is important. Contact a health care provider if you have: A headache that does not go away with medicine. Hives, rash, or itchy skin. Nausea or vomiting. Unusual tiredness or weakness. Signs of infection at your IV site. Get help right away if: You have a fever or chills. You urinate less  often than usual. Your urine is darker colored than normal. You have any of the following: Trouble breathing. Pain in your back, abdomen, or chest. Cool, clammy skin. A fast heartbeat. Summary Platelets are tiny pieces of blood cells that clump  together to form a blood clot when you have an injury. If you have too few platelets, your blood may have trouble clotting. A platelet transfusion is a procedure in which you receive donated platelets through an IV. A platelet transfusion may be used to stop or prevent excessive bleeding. After the procedure, check your IV site every day for signs of infection, including redness, swelling, pain, or warmth. This information is not intended to replace advice given to you by your health care provider. Make sure you discuss any questions you have with your healthcare provider. Document Revised: 01/03/2018 Document Reviewed: 01/03/2018 Elsevier Patient Education  2022 [employer].

## 2021-05-31 NOTE — Telephone Encounter (Signed)
Dr. Marin Olp notified of HGB-6.1 and platelet count-7.  Order received from Dr. Marin Olp for pt to get 2 units of blood and one unit of platelets.

## 2021-05-31 NOTE — Patient Instructions (Signed)
Implanted Port Home Guide An implanted port is a device that is placed under the skin. It is usually placed in the chest. The device can be used to give IV medicine, to take blood, or for dialysis. You may have an implanted port if: You need IV medicine that would be irritating to the small veins in your hands or arms. You need IV medicines, such as antibiotics, for a long period of time. You need IV nutrition for a long period of time. You need dialysis. When you have a port, your health care provider can choose to use the port instead of veins in your arms for these procedures. You may have fewer limitations when using a port than you would if you used other types of long-term IVs, and you will likely be able to return to normal activities afteryour incision heals. An implanted port has two main parts: Reservoir. The reservoir is the part where a needle is inserted to give medicines or draw blood. The reservoir is round. After it is placed, it appears as a small, raised area under your skin. Catheter. The catheter is a thin, flexible tube that connects the reservoir to a vein. Medicine that is inserted into the reservoir goes into the catheter and then into the vein. How is my port accessed? To access your port: A numbing cream may be placed on the skin over the port site. Your health care provider will put on a mask and sterile gloves. The skin over your port will be cleaned carefully with a germ-killing soap and allowed to dry. Your health care provider will gently pinch the port and insert a needle into it. Your health care provider will check for a blood return to make sure the port is in the vein and is not clogged. If your port needs to remain accessed to get medicine continuously (constant infusion), your health care provider will place a clear bandage (dressing) over the needle site. The dressing and needle will need to be changed every week, or as told by your health care provider. What  is flushing? Flushing helps keep the port from getting clogged. Follow instructions from your health care provider about how and when to flush the port. Ports are usually flushed with saline solution or a medicine called heparin. The need for flushing will depend on how the port is used: If the port is only used from time to time to give medicines or draw blood, the port may need to be flushed: Before and after medicines have been given. Before and after blood has been drawn. As part of routine maintenance. Flushing may be recommended every 4-6 weeks. If a constant infusion is running, the port may not need to be flushed. Throw away any syringes in a disposal container that is meant for sharp items (sharps container). You can buy a sharps container from a pharmacy, or you can make one by using an empty hard plastic bottle with a cover. How long will my port stay implanted? The port can stay in for as long as your health care provider thinks it is needed. When it is time for the port to come out, a surgery will be done to remove it. The surgery will be similar to the procedure that was done to putthe port in. Follow these instructions at home:  Flush your port as told by your health care provider. If you need an infusion over several days, follow instructions from your health care provider about how to take   care of your port site. Make sure you: Wash your hands with soap and water before you change your dressing. If soap and water are not available, use alcohol-based hand sanitizer. Change your dressing as told by your health care provider. Place any used dressings or infusion bags into a plastic bag. Throw that bag in the trash. Keep the dressing that covers the needle clean and dry. Do not get it wet. Do not use scissors or sharp objects near the tube. Keep the tube clamped, unless it is being used. Check your port site every day for signs of infection. Check for: Redness, swelling, or  pain. Fluid or blood. Pus or a bad smell. Protect the skin around the port site. Avoid wearing bra straps that rub or irritate the site. Protect the skin around your port from seat belts. Place a soft pad over your chest if needed. Bathe or shower as told by your health care provider. The site may get wet as long as you are not actively receiving an infusion. Return to your normal activities as told by your health care provider. Ask your health care provider what activities are safe for you. Carry a medical alert card or wear a medical alert bracelet at all times. This will let health care providers know that you have an implanted port in case of an emergency. Get help right away if: You have redness, swelling, or pain at the port site. You have fluid or blood coming from your port site. You have pus or a bad smell coming from the port site. You have a fever. Summary Implanted ports are usually placed in the chest for long-term IV access. Follow instructions from your health care provider about flushing the port and changing bandages (dressings). Take care of the area around your port by avoiding clothing that puts pressure on the area, and by watching for signs of infection. Protect the skin around your port from seat belts. Place a soft pad over your chest if needed. Get help right away if you have a fever or you have redness, swelling, pain, drainage, or a bad smell at the port site. This information is not intended to replace advice given to you by your health care provider. Make sure you discuss any questions you have with your healthcare provider. Document Revised: 04/13/2020 Document Reviewed: 04/13/2020 Elsevier Patient Education  2022 [employer]  

## 2021-06-01 ENCOUNTER — Other Ambulatory Visit: Payer: Self-pay | Admitting: *Deleted

## 2021-06-01 ENCOUNTER — Inpatient Hospital Stay: Payer: PPO

## 2021-06-01 VITALS — BP 119/71 | HR 71 | Temp 98.0°F | Resp 18

## 2021-06-01 DIAGNOSIS — Z5111 Encounter for antineoplastic chemotherapy: Secondary | ICD-10-CM | POA: Diagnosis not present

## 2021-06-01 DIAGNOSIS — Z95828 Presence of other vascular implants and grafts: Secondary | ICD-10-CM

## 2021-06-01 DIAGNOSIS — D649 Anemia, unspecified: Secondary | ICD-10-CM

## 2021-06-01 DIAGNOSIS — D46Z Other myelodysplastic syndromes: Secondary | ICD-10-CM

## 2021-06-01 LAB — BPAM PLATELET PHERESIS
Blood Product Expiration Date: 202206222359
ISSUE DATE / TIME: 202206201133
Unit Type and Rh: 6200

## 2021-06-01 LAB — PREPARE PLATELET PHERESIS: Unit division: 0

## 2021-06-01 MED ORDER — SODIUM CHLORIDE 0.9% FLUSH
10.0000 mL | Freq: Once | INTRAVENOUS | Status: AC
  Administered 2021-06-01: 10 mL via INTRAVENOUS
  Filled 2021-06-01: qty 10

## 2021-06-01 MED ORDER — HEPARIN SOD (PORK) LOCK FLUSH 100 UNIT/ML IV SOLN
500.0000 [IU] | Freq: Every day | INTRAVENOUS | Status: AC | PRN
  Administered 2021-06-01: 500 [IU]
  Filled 2021-06-01: qty 5

## 2021-06-01 MED ORDER — SODIUM CHLORIDE 0.9% IV SOLUTION
250.0000 mL | Freq: Once | INTRAVENOUS | Status: DC
  Filled 2021-06-01: qty 250

## 2021-06-01 NOTE — Patient Instructions (Signed)
[employer] medicine (25th ed., pp. 1059-1068). [place], [place]: Elsevier.">  Anemia  Anemia is a condition in which there is not enough red blood cells or hemoglobin in the blood. Hemoglobin is a substance in red blood cells thatcarries oxygen. When you do not have enough red blood cells or hemoglobin (are anemic), your body cannot get enough oxygen and your organs may not work properly. Asa result, you may feel very tired or have other problems. What are the causes? Common causes of anemia include: Excessive bleeding. Anemia can be caused by excessive bleeding inside or outside the body, including bleeding from the intestines or from heavy menstrual periods in females. Poor nutrition. Long-lasting (chronic) kidney, thyroid, and liver disease. Bone marrow disorders, spleen problems, and blood disorders. Cancer and treatments for cancer. HIV (human immunodeficiency virus) and AIDS (acquired immunodeficiency syndrome). Infections, medicines, and autoimmune disorders that destroy red blood cells. What are the signs or symptoms? Symptoms of this condition include: Minor weakness. Dizziness. Headache, or difficulties concentrating and sleeping. Heartbeats that feel irregular or faster than normal (palpitations). Shortness of breath, especially with exercise. Pale skin, lips, and nails, or cold hands and feet. Indigestion and nausea. Symptoms may occur suddenly or develop slowly. If your anemia is mild, you maynot have symptoms. How is this diagnosed? This condition is diagnosed based on blood tests, your medical history, and a physical exam. In some cases, a test may be needed in which cells are removed from the soft tissue inside of a bone and looked at under a microscope (bone marrow biopsy). Your health care provider may also check your stool (feces) for blood and may do additional testing to look for the cause of yourbleeding. Other tests may include: Imaging tests, such as a CT scan or  MRI. A procedure to see inside your esophagus and stomach (endoscopy). A procedure to see inside your colon and rectum (colonoscopy). How is this treated? Treatment for this condition depends on the cause. If you continue to lose a lot of blood, you may need to be treated at a hospital. Treatment may include: Taking supplements of iron, vitamin B12, or folic acid. Taking a hormone medicine (erythropoietin) that can help to stimulate red blood cell growth. Having a blood transfusion. This may be needed if you lose a lot of blood. Making changes to your diet. Having surgery to remove your spleen. Follow these instructions at home: Take over-the-counter and prescription medicines only as told by your health care provider. Take supplements only as told by your health care provider. Follow any diet instructions that you were given by your health care provider. Keep all follow-up visits as told by your health care provider. This is important. Contact a health care provider if: You develop new bleeding anywhere in the body. Get help right away if: You are very weak. You are short of breath. You have pain in your abdomen or chest. You are dizzy or feel faint. You have trouble concentrating. You have bloody stools, black stools, or tarry stools. You vomit repeatedly or you vomit up blood. These symptoms may represent a serious problem that is an emergency. Do not wait to see if the symptoms will go away. Get medical help right away. Call your local emergency services (911 in the [place] Do not drive yourself to the hospital. Summary Anemia is a condition in which you do not have enough red blood cells or enough of a substance in your red blood cells that carries oxygen (hemoglobin). Symptoms may occur suddenly   or develop slowly. If your anemia is mild, you may not have symptoms. This condition is diagnosed with blood tests, a medical history, and a physical exam. Other tests may be  needed. Treatment for this condition depends on the cause of the anemia. This information is not intended to replace advice given to you by your health care provider. Make sure you discuss any questions you have with your healthcare provider. Document Revised: 11/05/2019 Document Reviewed: 11/05/2019 Elsevier Patient Education  2022 [employer].

## 2021-06-02 ENCOUNTER — Encounter: Payer: Self-pay | Admitting: Physical Therapy

## 2021-06-02 ENCOUNTER — Other Ambulatory Visit: Payer: Self-pay

## 2021-06-02 ENCOUNTER — Ambulatory Visit: Payer: PPO | Admitting: Physical Therapy

## 2021-06-02 VITALS — BP 134/62 | HR 79

## 2021-06-02 DIAGNOSIS — M6281 Muscle weakness (generalized): Secondary | ICD-10-CM

## 2021-06-02 DIAGNOSIS — R2681 Unsteadiness on feet: Secondary | ICD-10-CM

## 2021-06-02 LAB — BPAM RBC
Blood Product Expiration Date: 202207082359
Blood Product Expiration Date: 202207082359
ISSUE DATE / TIME: 202206201204
ISSUE DATE / TIME: 202206210753
Unit Type and Rh: 6200
Unit Type and Rh: 6200

## 2021-06-02 LAB — TYPE AND SCREEN
ABO/RH(D): A POS
Antibody Screen: NEGATIVE
Unit division: 0
Unit division: 0

## 2021-06-02 NOTE — Therapy (Addendum)
[facility] [address]  [place], [place], [address] Phone: [phone]   Fax:  [phone]  Physical Therapy Treatment  Patient Details  Name: Theodore Jenkins MRN: [id number] Date of Birth: 1946/04/15 Referring Provider (PT): Burney Gauze, MD   Encounter Date: 06/02/2021   PT End of Session - 06/02/21 1141     Visit Number 2    Number of Visits 17    Date for PT Re-Evaluation 07/21/21    Authorization Type [employer]    PT Start Time 1047    PT Stop Time 1138    PT Time Calculation (min) 51 min    Activity Tolerance Patient tolerated treatment well;Patient limited by fatigue    Behavior During Therapy [facility] for tasks assessed/performed             Past Medical History:  Diagnosis Date   Aortic stenosis    Echo (08/2017): Severe AS with mean gradient of 42 mmHg.   Barrett's esophagus    CAD (coronary artery disease) 06/06/2011   Remote stent to the LAD in 2005 // Last cath in 2010 showing diffuse CAD, small PD that is occluded and fills with left to right collaterals, 60-70% 1st OM, with normal EF. Managed medically // Myoview 3/18:  Normal perfusion. LVEF 55% with normal wall motion. This is a low risk study.   Complication of anesthesia    Diabetes mellitus    ED (erectile dysfunction)    Family history of adverse reaction to anesthesia 10/23/2017   sister had history of malignant hyperthermia when she was 50, she is 73 years old now   GERD (gastroesophageal reflux disease)    Goals of care, counseling/discussion 12/22/2020   Hyperlipidemia    Hypertension    IHD (ischemic heart disease)    prior stenting of the LAD in December of 2005; with last cath in 2010 showing diffuse CAD with normal LV function. He is managed medically.    Malignant hyperthermia    sister had reaction concerning for malignant hyperthermia (~ 1988)   MDS (myelodysplastic syndrome), high grade ([place]) 12/22/2020   Neuromuscular  disorder ([place]) 10/23/2017   lumbar 4 disc in back causing nerve pain, on gabapentin   Obesity    Obstructive sleep apnea on CPAP    PONV (postoperative nausea and vomiting)    S/P aortic valve replacement with bioprosthetic valve 10/26/2017   25 mm [facility] Ease bovine pericardial tissue valve   S/P coronary artery stent placement 2005   LAD    Past Surgical History:  Procedure Laterality Date   AORTIC VALVE REPLACEMENT N/A 10/26/2017   Procedure: AORTIC VALVE REPLACEMENT (AVR), using Magna Ease 25;  Surgeon: Rexene Alberts, MD;  Location: [facility];  Service: Open Heart Surgery;  Laterality: N/A;   CARDIAC CATHETERIZATION  2010   NORMAL LV FUNCTION. DIFFUSE CORONARY DISEASE WITH THE DISTAL PORTION OF THE SMALL POSTERIOR DESCENDING VESSEL OCCLUDED AND FILLING WITH THE RIGHT COLLATERALS, 60-70% STENOSIS IN THE FIRST OBTUSE MARGINAL VESSEL AND DIFFUSE SMALL VESSEL DISEASE   CAROTID ENDARTERECTOMY     CIRCUMCISION  1985   CORONARY ARTERY BYPASS GRAFT N/A 10/26/2017   Procedure: CORONARY ARTERY BYPASS GRAFTING (CABG) x two , using left internal mammary artery and right leg greater saphenous vein harvested endoscopically;  Surgeon: Rexene Alberts, MD;  Location: [facility];  Service: Open Heart Surgery;  Laterality: N/A;   CORONARY STENT PLACEMENT  2005   IR IMAGING GUIDED  PORT INSERTION  12/28/2020   RIGHT/LEFT HEART CATH AND CORONARY ANGIOGRAPHY N/A 08/25/2017   Procedure: RIGHT/LEFT HEART CATH AND CORONARY ANGIOGRAPHY;  Surgeon: Nelva Bush, MD;  Location: [facility];  Service: Cardiovascular;  Laterality: N/A;   rotator cuff surgery Right    TEE WITHOUT CARDIOVERSION N/A 10/26/2017   Procedure: TRANSESOPHAGEAL ECHOCARDIOGRAM (TEE);  Surgeon: Rexene Alberts, MD;  Location: [facility];  Service: Open Heart Surgery;  Laterality: N/A;   TEE WITHOUT CARDIOVERSION N/A 05/01/2020   Procedure: TRANSESOPHAGEAL ECHOCARDIOGRAM (TEE);  Surgeon: Buford Dresser, MD;  Location: [facility]  ENDOSCOPY;  Service: Cardiovascular;  Laterality: N/A;    Vitals:   06/02/21 1048  BP: 134/62  Pulse: 79  SpO2: 98%     Subjective Assessment - 06/02/21 1051     Subjective On a new chemo drug which has been giving him diarhhea. Received blood and platelets on monday, blood on tuesday of this weekend. Reports that he felt good on Wednesday of last week but after cleaning the pool he blacked out and fell in the basement. Bruised his L knee and elbow but denies hitting his head. Made his providers aware of this. Reports that when these blackouts have occured, his blood sugar has been high. Blood glucose 160 this AM. Has not done his HEP.    Pertinent History CAD, DM, GERD, HLD, HTN, myelodysplastic syndrome, CABG x2 2018, R RTC repair    Diagnostic tests none recent    Patient Stated Goals improve balance and strength    Currently in Pain? No/denies                               [facility] Adult PT Treatment/Exercise - 06/02/21 0001       Neuro Re-ed    Neuro Re-ed Details  tandem balance at counter 30" each, marching on foam 2x20,   intermitent UE support; cues to contract core and glutes     Exercises   Exercises Knee/Hip      Knee/Hip Exercises: Aerobic   Nustep L4 x 6 min (UEs/LEs)      Knee/Hip Exercises: Seated   Long Arc Quad Strengthening;Right;1 set;10 reps    Long Arc Quad Limitations red TB; cues for rhythmic breathing   towel under band to avoid skin irritation   Hamstring Curl Strengthening;Left;1 set;10 reps    Hamstring Limitations green TB with towel behind band    Sit to Sand 1 set;10 reps;without UE support   cueing for proper set up and encourage anterior trunk lean                     PT Short Term Goals - 06/02/21 1148       PT SHORT TERM GOAL #1   Title Patient to be independent with initial HEP.    Time 3    Period Weeks    Status On-going    Target Date 06/16/21               PT Long Term Goals - 06/02/21 1148        PT LONG TERM GOAL #1   Title Patient to be independent with advanced HEP.    Time 8    Period Weeks    Status On-going      PT LONG TERM GOAL #2   Title Patient to demonstrate B LE strength >/=4+/5.    Time 8    Period Weeks  Status On-going      PT LONG TERM GOAL #3   Title Patient to score atleast 48/56 on Berg in order to decrease risk of falls.    Time 8    Period Weeks    Status On-going      PT LONG TERM GOAL #4   Title Patient to perform 5xSTS without UEs in < 15 sec in order to decrease risk of falls.    Time 8    Period Weeks    Status On-going                   Plan - 06/02/21 1141     Clinical Impression Statement Patient arrived to session with report of falling last week on Wednesday after cleaning out his pool. Notes that he felt fine until he blacked out and bruised his L knee and elbow. Denies hitting his head and notes that his other providers were made aware. Patient admits to noncompliance with HEP, thus took time to review these exercises. Patient required cueing for proper set up and encouragement for increased anterior trunk lean with STS transfers. Good effort and control was demonstrated. Patient ambulating with SPC today, thus adjusted it to appropriate height. Worked on static balance exercises with cueing to contract core and glutes for improved stability. Patient demonstrated more instability on compliance surface and with L LE stabilizing. Intermittent sitting rest breaks taken d/t fatigue. SpO2 maintained between 98-99% throughout session. Patient tolerated ther-ex well today and without complaints at end of session.    Comorbidities CAD, DM, GERD, HLD, HTN, myelodysplastic syndrome, CABG x2 2018, R RTC repair    PT Treatment/Interventions ADLs/Self Care Home Management;Cryotherapy;Electrical Stimulation;Moist Heat;Balance training;Therapeutic exercise;Therapeutic activities;Functional mobility training;Stair training;Gait  training;Neuromuscular re-education;Patient/family education;Manual techniques;Taping;Energy conservation;Dry needling;Passive range of motion    PT Next Visit Plan reassess HEP; progress LE strength and dynamic balance    Consulted and Agree with Plan of Care Patient;Family member/caregiver    Family Member Consulted wife             Patient will benefit from skilled therapeutic intervention in order to improve the following deficits and impairments:  Abnormal gait, Decreased endurance, Decreased activity tolerance, Decreased strength, Increased fascial restricitons, Pain, Impaired UE functional use, Decreased balance, Difficulty walking, Improper body mechanics, Decreased range of motion, Postural dysfunction, Impaired flexibility  Visit Diagnosis: Unsteadiness on feet  Muscle weakness (generalized)     Problem List Patient Active Problem List   Diagnosis Date Noted   MDS (myelodysplastic syndrome), high grade ([place]) 12/22/2020   Goals of care, counseling/discussion 12/22/2020   Pancytopenia ([place]) 12/10/2020   AKI (acute kidney injury) ([place]) 12/10/2020   Acute bacterial endocarditis    Hyponatremia 04/30/2020   Bacteremia 04/29/2020   S/P AVR (aortic valve replacement) 02/15/2018   S/P aortic valve replacement with bioprosthetic valve  10/26/2017   S/P CABG x 2 10/26/2017   Type 2 diabetes mellitus with complication, without long-term current use of insulin ([place])    Accelerating angina ([place]) 08/18/2017   Carotid artery disease, unspecified laterality ([place]) 08/08/2016   Exertional chest pain 10/03/2014   Aortic valve stenosis 05/03/2014   ED (erectile dysfunction) 03/20/2012   Coronary artery disease involving native coronary artery of native heart without angina pectoris 06/06/2011   Hyperlipidemia LDL goal <70 06/06/2011   Essential hypertension 06/06/2011   Obesities, morbid ([place]) 06/06/2011     Janene Harvey, PT, DPT 06/02/21 11:50 AM   [facility]  [address]  [place], [place], [address] Phone: [phone]   Fax:  [phone]  Name: KARMELLO ABERCROMBIE MRN: [id number] Date of Birth: 19-Mar-1946   PHYSICAL THERAPY DISCHARGE SUMMARY  Visits from Start of Care: 2  Current functional level related to goals / functional outcomes: Unable to assess; patient did not return d/t illness   Remaining deficits: Unable to assess   Education / Equipment: HEP  Plan: Patient agrees to discharge.  Patient goals were not met. Patient is being discharged due to not returning.    Janene Harvey, PT, DPT 07/28/21 4:32 PM

## 2021-06-08 ENCOUNTER — Inpatient Hospital Stay: Payer: PPO

## 2021-06-08 ENCOUNTER — Other Ambulatory Visit: Payer: Self-pay

## 2021-06-08 ENCOUNTER — Encounter: Payer: Self-pay | Admitting: Hematology & Oncology

## 2021-06-08 ENCOUNTER — Encounter: Payer: Self-pay | Admitting: *Deleted

## 2021-06-08 ENCOUNTER — Inpatient Hospital Stay (HOSPITAL_BASED_OUTPATIENT_CLINIC_OR_DEPARTMENT_OTHER): Payer: PPO | Admitting: Hematology & Oncology

## 2021-06-08 ENCOUNTER — Other Ambulatory Visit (HOSPITAL_COMMUNITY): Payer: Self-pay

## 2021-06-08 VITALS — BP 114/46 | HR 76 | Temp 98.2°F | Resp 17

## 2021-06-08 DIAGNOSIS — D649 Anemia, unspecified: Secondary | ICD-10-CM

## 2021-06-08 DIAGNOSIS — D46Z Other myelodysplastic syndromes: Secondary | ICD-10-CM

## 2021-06-08 DIAGNOSIS — Z5111 Encounter for antineoplastic chemotherapy: Secondary | ICD-10-CM | POA: Diagnosis not present

## 2021-06-08 LAB — CBC WITH DIFFERENTIAL (CANCER CENTER ONLY)
Abs Immature Granulocytes: 0.12 10*3/uL — ABNORMAL HIGH (ref 0.00–0.07)
Basophils Absolute: 0 10*3/uL (ref 0.0–0.1)
Basophils Relative: 0 %
Eosinophils Absolute: 0 10*3/uL (ref 0.0–0.5)
Eosinophils Relative: 0 %
HCT: 18.1 % — ABNORMAL LOW (ref 39.0–52.0)
Hemoglobin: 6.1 g/dL — CL (ref 13.0–17.0)
Immature Granulocytes: 15 %
Lymphocytes Relative: 33 %
Lymphs Abs: 0.3 10*3/uL — ABNORMAL LOW (ref 0.7–4.0)
MCH: 31 pg (ref 26.0–34.0)
MCHC: 33.7 g/dL (ref 30.0–36.0)
MCV: 91.9 fL (ref 80.0–100.0)
Monocytes Absolute: 0.1 10*3/uL (ref 0.1–1.0)
Monocytes Relative: 13 %
Neutro Abs: 0.3 10*3/uL — CL (ref 1.7–7.7)
Neutrophils Relative %: 39 %
Platelet Count: 16 10*3/uL — ABNORMAL LOW (ref 150–400)
RBC: 1.97 MIL/uL — ABNORMAL LOW (ref 4.22–5.81)
RDW: 17.3 % — ABNORMAL HIGH (ref 11.5–15.5)
WBC Count: 0.8 10*3/uL — CL (ref 4.0–10.5)
nRBC: 2.5 % — ABNORMAL HIGH (ref 0.0–0.2)

## 2021-06-08 LAB — CMP (CANCER CENTER ONLY)
ALT: 12 U/L (ref 0–44)
AST: 20 U/L (ref 15–41)
Albumin: 4.2 g/dL (ref 3.5–5.0)
Alkaline Phosphatase: 56 U/L (ref 38–126)
Anion gap: 10 (ref 5–15)
BUN: 30 mg/dL — ABNORMAL HIGH (ref 8–23)
CO2: 19 mmol/L — ABNORMAL LOW (ref 22–32)
Calcium: 9.3 mg/dL (ref 8.9–10.3)
Chloride: 95 mmol/L — ABNORMAL LOW (ref 98–111)
Creatinine: 1.33 mg/dL — ABNORMAL HIGH (ref 0.61–1.24)
GFR, Estimated: 57 mL/min — ABNORMAL LOW (ref >60)
Glucose, Bld: 166 mg/dL — ABNORMAL HIGH (ref 70–99)
Potassium: 4 mmol/L (ref 3.5–5.1)
Sodium: 124 mmol/L — ABNORMAL LOW (ref 135–145)
Total Bilirubin: 1 mg/dL (ref 0.3–1.2)
Total Protein: 7.3 g/dL (ref 6.5–8.1)

## 2021-06-08 LAB — LACTATE DEHYDROGENASE: LDH: 221 U/L — ABNORMAL HIGH (ref 98–192)

## 2021-06-08 LAB — PREPARE RBC (CROSSMATCH)

## 2021-06-08 LAB — IRON AND TIBC
Iron: 117 ug/dL (ref 45–182)
Saturation Ratios: 41 % — ABNORMAL HIGH (ref 17.9–39.5)
TIBC: 285 ug/dL (ref 250–450)
UIBC: 168 ug/dL

## 2021-06-08 LAB — SAMPLE TO BLOOD BANK

## 2021-06-08 LAB — RETICULOCYTES
Immature Retic Fract: 16.7 % — ABNORMAL HIGH (ref 2.3–15.9)
RBC.: 1.97 MIL/uL — ABNORMAL LOW (ref 4.22–5.81)
Retic Count, Absolute: 43.3 10*3/uL (ref 19.0–186.0)
Retic Ct Pct: 2.2 % (ref 0.4–3.1)

## 2021-06-08 LAB — SAVE SMEAR(SSMR), FOR PROVIDER SLIDE REVIEW

## 2021-06-08 LAB — FERRITIN: Ferritin: 684 ng/mL — ABNORMAL HIGH (ref 24–336)

## 2021-06-08 MED ORDER — ACETAMINOPHEN 325 MG PO TABS
650.0000 mg | ORAL_TABLET | Freq: Once | ORAL | Status: DC
Start: 2021-06-08 — End: 2021-06-08

## 2021-06-08 MED ORDER — FUROSEMIDE 10 MG/ML IJ SOLN: 20.0000 mg | Freq: Once | INTRAMUSCULAR | Status: DC

## 2021-06-08 MED ORDER — DIPHENHYDRAMINE HCL 25 MG PO CAPS: 25.0000 mg | ORAL_CAPSULE | Freq: Once | ORAL | Status: DC

## 2021-06-08 MED ORDER — HEPARIN SOD (PORK) LOCK FLUSH 100 UNIT/ML IV SOLN
500.0000 [IU] | Freq: Every day | INTRAVENOUS | Status: AC | PRN
  Administered 2021-06-08: 500 [IU]
  Filled 2021-06-08: qty 5

## 2021-06-08 MED ORDER — CIPROFLOXACIN HCL 500 MG PO TABS: 500.0000 mg | ORAL_TABLET | Freq: Two times a day (BID) | ORAL | 4 refills | Status: DC

## 2021-06-08 MED ORDER — SODIUM CHLORIDE 0.9% FLUSH
10.0000 mL | INTRAVENOUS | Status: AC | PRN
  Administered 2021-06-08: 10 mL
  Filled 2021-06-08: qty 10

## 2021-06-08 MED ORDER — CIPROFLOXACIN IN D5W 400 MG/200ML IV SOLN
400.0000 mg | Freq: Once | INTRAVENOUS | Status: AC
  Administered 2021-06-08: 400 mg via INTRAVENOUS
  Filled 2021-06-08: qty 200

## 2021-06-08 MED ORDER — SODIUM CHLORIDE 0.9% IV SOLUTION
250.0000 mL | Freq: Once | INTRAVENOUS | Status: AC
  Administered 2021-06-08: 250 mL via INTRAVENOUS
  Filled 2021-06-08: qty 250

## 2021-06-08 NOTE — Progress Notes (Signed)
Theodore Jenkins in lab brought to my attention a critical lab value. ANC is 0.3.

## 2021-06-08 NOTE — Patient Instructions (Signed)
[employer] medicine (25th ed., pp. 1059-1068). [place], [place]: Elsevier.">  Anemia  Anemia is a condition in which there is not enough red blood cells or hemoglobin in the blood. Hemoglobin is a substance in red blood cells thatcarries oxygen. When you do not have enough red blood cells or hemoglobin (are anemic), your body cannot get enough oxygen and your organs may not work properly. Asa result, you may feel very tired or have other problems. What are the causes? Common causes of anemia include: Excessive bleeding. Anemia can be caused by excessive bleeding inside or outside the body, including bleeding from the intestines or from heavy menstrual periods in females. Poor nutrition. Long-lasting (chronic) kidney, thyroid, and liver disease. Bone marrow disorders, spleen problems, and blood disorders. Cancer and treatments for cancer. HIV (human immunodeficiency virus) and AIDS (acquired immunodeficiency syndrome). Infections, medicines, and autoimmune disorders that destroy red blood cells. What are the signs or symptoms? Symptoms of this condition include: Minor weakness. Dizziness. Headache, or difficulties concentrating and sleeping. Heartbeats that feel irregular or faster than normal (palpitations). Shortness of breath, especially with exercise. Pale skin, lips, and nails, or cold hands and feet. Indigestion and nausea. Symptoms may occur suddenly or develop slowly. If your anemia is mild, you maynot have symptoms. How is this diagnosed? This condition is diagnosed based on blood tests, your medical history, and a physical exam. In some cases, a test may be needed in which cells are removed from the soft tissue inside of a bone and looked at under a microscope (bone marrow biopsy). Your health care provider may also check your stool (feces) for blood and may do additional testing to look for the cause of yourbleeding. Other tests may include: Imaging tests, such as a CT scan or  MRI. A procedure to see inside your esophagus and stomach (endoscopy). A procedure to see inside your colon and rectum (colonoscopy). How is this treated? Treatment for this condition depends on the cause. If you continue to lose a lot of blood, you may need to be treated at a hospital. Treatment may include: Taking supplements of iron, vitamin B12, or folic acid. Taking a hormone medicine (erythropoietin) that can help to stimulate red blood cell growth. Having a blood transfusion. This may be needed if you lose a lot of blood. Making changes to your diet. Having surgery to remove your spleen. Follow these instructions at home: Take over-the-counter and prescription medicines only as told by your health care provider. Take supplements only as told by your health care provider. Follow any diet instructions that you were given by your health care provider. Keep all follow-up visits as told by your health care provider. This is important. Contact a health care provider if: You develop new bleeding anywhere in the body. Get help right away if: You are very weak. You are short of breath. You have pain in your abdomen or chest. You are dizzy or feel faint. You have trouble concentrating. You have bloody stools, black stools, or tarry stools. You vomit repeatedly or you vomit up blood. These symptoms may represent a serious problem that is an emergency. Do not wait to see if the symptoms will go away. Get medical help right away. Call your local emergency services (911 in the [place] Do not drive yourself to the hospital. Summary Anemia is a condition in which you do not have enough red blood cells or enough of a substance in your red blood cells that carries oxygen (hemoglobin). Symptoms may occur suddenly   or develop slowly. If your anemia is mild, you may not have symptoms. This condition is diagnosed with blood tests, a medical history, and a physical exam. Other tests may be  needed. Treatment for this condition depends on the cause of the anemia. This information is not intended to replace advice given to you by your health care provider. Make sure you discuss any questions you have with your healthcare provider. Document Revised: 11/05/2019 Document Reviewed: 11/05/2019 Elsevier Patient Education  2022 [employer].

## 2021-06-08 NOTE — Progress Notes (Signed)
Hematology and Oncology Follow Up Visit  Theodore Jenkins [id number] [birth year] 75 y.o. 06/08/2021   Principle Diagnosis:  MDS -- RAEB-2  --  Normal cytogenetics;  IPSS = 5  Current Therapy:   Vidaza 75 mg/m2 IV q day d1-7  -- s/p cycle #2- start on 01/25/2021 Venetoclax 400 mg po q day -- start on 05/19/2021     Interim History:  Theodore Jenkins is in for follow-up.  He comes in with his wife.  He is in a wheelchair.  He did weigh has been in a wheelchair.  Thankfully, he has had no problems with falling lately.  The 1 problem that we did have was that he had a temperature of 101.9 over the weekend.  He did not call anybody about this.  He had no chills or shakes.  He had no sweats.  There is no cough.  He took some Tylenol.  His white cell count is 0.8.  Some of this might be from the venetoclax that he has started.  I think he probably needs to be on oral ciprofloxacin.  He has had little bit of diarrhea.  He says he takes some Imodium when he has diarrhea.  He seems to be eating a little bit better.  He has had a couple mouth sores.  He has had a couple small hematomas in the mouth that opened up on him.  There is been no problems with headache although he does have this pain behind his ears that goes down to his neck.  I am unsure exactly why he would have discounted pain.  He has had no tinnitus.  There is been no problems with leg swelling.  He has had no urinary difficulties.  Overall, I would say his performance status is by ECOG 1.    Medications:  Current Outpatient Medications:    ciprofloxacin (CIPRO) 500 MG tablet, Take 1 tablet (500 mg total) by mouth 2 (two) times daily., Disp: 30 tablet, Rfl: 4   acetaminophen (TYLENOL) 500 MG tablet, Take 1,000 mg by mouth every 8 (eight) hours as needed for mild pain (or headaches)., Disp: , Rfl:    amoxicillin (AMOXIL) 500 MG capsule, Take 2,000 mg by mouth See admin instructions. Take 2,000 mg by mouth one hour prior to dental appointments,  Disp: , Rfl:    chlorhexidine (PERIDEX) 0.12 % solution, Use as directed 15 mLs in the mouth or throat 2 (two) times daily., Disp: 450 mL, Rfl: 3   dexamethasone (DECADRON) 4 MG tablet, Take 2 tablets (8 mg total) by mouth daily. Start the day after chemotherapy ends for 2 days. Take with food., Disp: 30 tablet, Rfl: 1   fluticasone (FLONASE) 50 MCG/ACT nasal spray, Place 2 sprays into both nostrils in the morning., Disp: , Rfl:    gabapentin (NEURONTIN) 300 MG capsule, Take 600 mg by mouth at bedtime. , Disp: , Rfl:    lidocaine-prilocaine (EMLA) cream, Apply to affected area once, Disp: 30 g, Rfl: 3   lisinopril (ZESTRIL) 5 MG tablet, TAKE 1 TABLET BY MOUTH DAILY, Disp: 90 tablet, Rfl: 2   loratadine (CLARITIN) 10 MG tablet, Take 10 mg by mouth daily., Disp: , Rfl:    LORazepam (ATIVAN) 0.5 MG tablet, Take 1 tablet (0.5 mg total) by mouth every 6 (six) hours as needed (Nausea or vomiting). (Patient not taking: No sig reported), Disp: 30 tablet, Rfl: 0   metoprolol succinate (TOPROL-XL) 50 MG 24 hr tablet, Take 50 mg by mouth at bedtime.,  Disp: , Rfl:    Multiple Vitamins-Minerals (ONE-A-DAY 50 PLUS PO), Take 1 tablet by mouth daily with breakfast., Disp: , Rfl:    nitroGLYCERIN (NITROSTAT) 0.4 MG SL tablet, Place 1 tablet (0.4 mg total) under the tongue every 5 (five) minutes as needed for chest pain., Disp: 25 tablet, Rfl: 6   omeprazole (PRILOSEC) 20 MG capsule, Take 20 mg by mouth daily., Disp: , Rfl:    ondansetron (ZOFRAN) 8 MG tablet, Take 1 tablet (8 mg total) by mouth 2 (two) times daily as needed for refractory nausea / vomiting. Start on the third day after last day of chemotherapy. (Patient not taking: No sig reported), Disp: 30 tablet, Rfl: 1   ONETOUCH VERIO test strip, 1 each by Other route daily., Disp: , Rfl: 3   polyethylene glycol (MIRALAX / GLYCOLAX) 17 g packet, Take 17 g by mouth daily. (Patient not taking: No sig reported), Disp: 14 each, Rfl: 0   prochlorperazine (COMPAZINE)  10 MG tablet, Take 1 tablet (10 mg total) by mouth every 6 (six) hours as needed (Nausea or vomiting). (Patient not taking: No sig reported), Disp: 30 tablet, Rfl: 1   rosuvastatin (CRESTOR) 10 MG tablet, Take 10 mg by mouth at bedtime., Disp: , Rfl:    sitaGLIPtin-metformin (JANUMET) 50-1000 MG tablet, Take 1 tablet by mouth 2 (two) times daily with a meal. , Disp: , Rfl:    venetoclax (VENCLEXTA) 100 MG tablet, Take 1 tablet (145m) by mouth daily for 1 week, then 2 tablets (2069m for 1 week, followed by 4 tablets (40077mdaily.Tablets should be swallowed whole with a meal and a full glass of water., Disp: 77 tablet, Rfl: 0 No current facility-administered medications for this visit.  Facility-Administered Medications Ordered in Other Visits:    acetaminophen (TYLENOL) tablet 650 mg, 650 mg, Oral, Once, [place], Sarah M, NP   ciprofloxacin (CIPRO) IVPB 400 mg, 400 mg, Intravenous, Once, Emilianna Barlowe, PetRudell CobbD   diphenhydrAMINE (BENADRYL) capsule 25 mg, 25 mg, Oral, Once, [place], Sarah M, NP   furosemide (LASIX) injection 20 mg, 20 mg, Intravenous, Once, [place], Sarah M, NP   heparin lock flush 100 unit/mL, 500 Units, Intracatheter, Daily PRN, [place], Sarah M, NP   sodium chloride flush (NS) 0.9 % injection 10 mL, 10 mL, Intracatheter, PRN, EnnVolanda NapoleonD, 10 mL at 04/14/21 1614   sodium chloride flush (NS) 0.9 % injection 10 mL, 10 mL, Intracatheter, PRN, [place], SarHolli HumblesP  Allergies:  Allergies  Allergen Reactions   Oxycodone Nausea And Vomiting    Past Medical History, Surgical history, Social history, and Family History were reviewed and updated.  Review of Systems: Review of Systems  Constitutional: Negative.   HENT:  Negative.    Eyes: Negative.   Respiratory: Negative.    Cardiovascular: Negative.   Gastrointestinal: Negative.   Endocrine: Negative.   Genitourinary: Negative.    Musculoskeletal: Negative.   Skin:  Positive for rash.  Neurological:  Negative.   Hematological:  Bruises/bleeds easily.  Psychiatric/Behavioral: Negative.     Physical Exam:  weight is 213 lb (96.6 kg). His oral temperature is 99.5 F (37.5 C). His blood pressure is 120/49 (abnormal) and his pulse is 89. His respiration is 20 and oxygen saturation is 100%.   Wt Readings from Last 3 Encounters:  06/08/21 213 lb (96.6 kg)  05/11/21 219 lb (99.3 kg)  04/05/21 213 lb (96.6 kg)    Physical Exam Vitals reviewed.  HENT:     Head: Normocephalic  and atraumatic.  Eyes:     Pupils: Pupils are equal, round, and reactive to light.  Cardiovascular:     Rate and Rhythm: Normal rate and regular rhythm.     Heart sounds: Normal heart sounds.  Pulmonary:     Effort: Pulmonary effort is normal.     Breath sounds: Normal breath sounds.  Abdominal:     General: Bowel sounds are normal.     Palpations: Abdomen is soft.  Musculoskeletal:        General: No tenderness or deformity. Normal range of motion.     Cervical back: Normal range of motion.  Lymphadenopathy:     Cervical: No cervical adenopathy.  Skin:    General: Skin is warm and dry.     Findings: No erythema or rash.     Comments: Skin exam does show some scattered ecchymoses.  I do not see any obvious petechia on his legs.  He has no rashes.  Neurological:     Mental Status: He is alert and oriented to person, place, and time.  Psychiatric:        Behavior: Behavior normal.        Thought Content: Thought content normal.        Judgment: Judgment normal.     Lab Results  Component Value Date   WBC 0.8 (LL) 06/08/2021   HGB 6.1 (LL) 06/08/2021   HCT 18.1 (L) 06/08/2021   MCV 91.9 06/08/2021   PLT 16 (L) 06/08/2021     Chemistry      Component Value Date/Time   NA 124 (L) 06/08/2021 0810   NA 131 (L) 12/09/2020 1629   K 4.0 06/08/2021 0810   CL 95 (L) 06/08/2021 0810   CO2 19 (L) 06/08/2021 0810   BUN 30 (H) 06/08/2021 0810   BUN 24 12/09/2020 1629   CREATININE 1.33 (H) 06/08/2021  0810      Component Value Date/Time   CALCIUM 9.3 06/08/2021 0810   ALKPHOS 56 06/08/2021 0810   AST 20 06/08/2021 0810   ALT 12 06/08/2021 0810   BILITOT 1.0 06/08/2021 0810      Impression and Plan: Mr. Selbe is a very nice 75 year old white male.  He has high-grade myelodysplasia.  Bone marrow biopsy still shows that he has the myelodysplastic syndrome with excess blasts.  He certainly has not progressed.  He has not had a good response like I thought he would have at this point.  I we will hold on his treatment today.  I do not want to start treatment on him with the holiday weekend coming up.  He will need to be transfused.  I would go ahead and give him 2 units of blood.  I do worry about the fever.  With his neutropenia, we have to be careful.  I will start him on some ciprofloxacin 500 mg p.o. twice daily.  Given that he has a temperature, I will see about a couple blood cultures.  I do think this would be a bad idea.  Again he will start his sixth cycle of treatment on July 5.  Hopefully, with the addition of venetoclax, we will see an improvement in his blood counts.  I know this has been quite difficult.  He clearly has a very resilient myelodysplastic process that we are trying to treat.  He does need lab work every week.  We will plan to see him back myself in about 4 or 5 weeks.   Volanda Napoleon,  MD 6/28/202210:06 AM

## 2021-06-08 NOTE — Patient Instructions (Signed)

## 2021-06-09 ENCOUNTER — Other Ambulatory Visit: Payer: Self-pay | Admitting: Hematology & Oncology

## 2021-06-09 ENCOUNTER — Inpatient Hospital Stay: Payer: PPO

## 2021-06-09 ENCOUNTER — Other Ambulatory Visit (HOSPITAL_COMMUNITY): Payer: Self-pay

## 2021-06-09 ENCOUNTER — Other Ambulatory Visit: Payer: Self-pay | Admitting: Pharmacist

## 2021-06-09 DIAGNOSIS — D46Z Other myelodysplastic syndromes: Secondary | ICD-10-CM

## 2021-06-09 LAB — TYPE AND SCREEN
ABO/RH(D): A POS
Antibody Screen: NEGATIVE
Unit division: 0
Unit division: 0

## 2021-06-09 LAB — BPAM RBC
Blood Product Expiration Date: 202207122359
Blood Product Expiration Date: 202207122359
ISSUE DATE / TIME: 202206281122
ISSUE DATE / TIME: 202206281122
Unit Type and Rh: 6200
Unit Type and Rh: 6200

## 2021-06-09 MED ORDER — VENETOCLAX 100 MG PO TABS
ORAL_TABLET | ORAL | 0 refills | Status: DC
  Filled 2021-06-09: qty 77, fill #0

## 2021-06-09 MED ORDER — VENETOCLAX 100 MG PO TABS
400.0000 mg | ORAL_TABLET | Freq: Every day | ORAL | 0 refills | Status: DC
  Filled 2021-06-09: qty 120, 30d supply, fill #0

## 2021-06-10 ENCOUNTER — Other Ambulatory Visit (HOSPITAL_COMMUNITY): Payer: Self-pay

## 2021-06-10 ENCOUNTER — Inpatient Hospital Stay: Payer: PPO

## 2021-06-11 ENCOUNTER — Encounter: Payer: Self-pay | Admitting: Hematology & Oncology

## 2021-06-11 ENCOUNTER — Inpatient Hospital Stay: Payer: PPO

## 2021-06-11 ENCOUNTER — Encounter: Payer: Self-pay | Admitting: *Deleted

## 2021-06-15 ENCOUNTER — Encounter: Payer: Self-pay | Admitting: Hematology & Oncology

## 2021-06-15 ENCOUNTER — Inpatient Hospital Stay: Payer: PPO

## 2021-06-15 ENCOUNTER — Inpatient Hospital Stay: Payer: PPO | Attending: Hematology & Oncology

## 2021-06-15 ENCOUNTER — Other Ambulatory Visit: Payer: Self-pay

## 2021-06-15 VITALS — BP 87/45 | HR 79 | Temp 98.5°F | Resp 99

## 2021-06-15 DIAGNOSIS — Z95828 Presence of other vascular implants and grafts: Secondary | ICD-10-CM

## 2021-06-15 DIAGNOSIS — D469 Myelodysplastic syndrome, unspecified: Secondary | ICD-10-CM | POA: Diagnosis not present

## 2021-06-15 DIAGNOSIS — D46Z Other myelodysplastic syndromes: Secondary | ICD-10-CM

## 2021-06-15 LAB — CBC WITH DIFFERENTIAL (CANCER CENTER ONLY)
Abs Immature Granulocytes: 0.17 10*3/uL — ABNORMAL HIGH (ref 0.00–0.07)
Basophils Absolute: 0 10*3/uL (ref 0.0–0.1)
Basophils Relative: 0 %
Eosinophils Absolute: 0 10*3/uL (ref 0.0–0.5)
Eosinophils Relative: 0 %
HCT: 20.6 % — ABNORMAL LOW (ref 39.0–52.0)
Hemoglobin: 7 g/dL — ABNORMAL LOW (ref 13.0–17.0)
Immature Granulocytes: 18 %
Lymphocytes Relative: 28 %
Lymphs Abs: 0.3 10*3/uL — ABNORMAL LOW (ref 0.7–4.0)
MCH: 31 pg (ref 26.0–34.0)
MCHC: 34 g/dL (ref 30.0–36.0)
MCV: 91.2 fL (ref 80.0–100.0)
Monocytes Absolute: 0.2 10*3/uL (ref 0.1–1.0)
Monocytes Relative: 21 %
Neutro Abs: 0.3 10*3/uL — CL (ref 1.7–7.7)
Neutrophils Relative %: 33 %
Platelet Count: 27 10*3/uL — ABNORMAL LOW (ref 150–400)
RBC: 2.26 MIL/uL — ABNORMAL LOW (ref 4.22–5.81)
RDW: 16.1 % — ABNORMAL HIGH (ref 11.5–15.5)
WBC Count: 1 10*3/uL — ABNORMAL LOW (ref 4.0–10.5)
nRBC: 0 % (ref 0.0–0.2)

## 2021-06-15 LAB — BASIC METABOLIC PANEL - CANCER CENTER ONLY
Anion gap: 11 (ref 5–15)
BUN: 31 mg/dL — ABNORMAL HIGH (ref 8–23)
CO2: 19 mmol/L — ABNORMAL LOW (ref 22–32)
Calcium: 9.2 mg/dL (ref 8.9–10.3)
Chloride: 97 mmol/L — ABNORMAL LOW (ref 98–111)
Creatinine: 1.91 mg/dL — ABNORMAL HIGH (ref 0.61–1.24)
GFR, Estimated: 37 mL/min — ABNORMAL LOW (ref >60)
Glucose, Bld: 158 mg/dL — ABNORMAL HIGH (ref 70–99)
Potassium: 4.2 mmol/L (ref 3.5–5.1)
Sodium: 127 mmol/L — ABNORMAL LOW (ref 135–145)

## 2021-06-15 MED ORDER — SODIUM CHLORIDE 0.9% FLUSH
10.0000 mL | Freq: Once | INTRAVENOUS | Status: AC
Start: 2021-06-15 — End: 2021-06-15
  Administered 2021-06-15: 10 mL via INTRAVENOUS
  Filled 2021-06-15: qty 10

## 2021-06-15 MED ORDER — HEPARIN SOD (PORK) LOCK FLUSH 100 UNIT/ML IV SOLN
500.0000 [IU] | Freq: Once | INTRAVENOUS | Status: AC
  Administered 2021-06-15: 500 [IU] via INTRAVENOUS
  Filled 2021-06-15: qty 5

## 2021-06-15 NOTE — Addendum Note (Signed)
Addended by: Fabio Neighbors A on: 06/15/2021 08:55 AM   Modules accepted: Orders

## 2021-06-15 NOTE — Patient Instructions (Signed)

## 2021-06-15 NOTE — Progress Notes (Unsigned)
No treatment for pt per MD.

## 2021-06-16 ENCOUNTER — Other Ambulatory Visit (HOSPITAL_COMMUNITY): Payer: Self-pay

## 2021-06-16 ENCOUNTER — Inpatient Hospital Stay: Payer: PPO

## 2021-06-16 VITALS — BP 108/64 | HR 85 | Temp 98.7°F | Resp 18

## 2021-06-16 DIAGNOSIS — D469 Myelodysplastic syndrome, unspecified: Secondary | ICD-10-CM | POA: Diagnosis not present

## 2021-06-16 DIAGNOSIS — D46Z Other myelodysplastic syndromes: Secondary | ICD-10-CM

## 2021-06-16 MED ORDER — PALONOSETRON HCL INJECTION 0.25 MG/5ML
INTRAVENOUS | Status: AC
  Filled 2021-06-16: qty 5

## 2021-06-16 MED ORDER — SODIUM CHLORIDE 0.9 % IV SOLN
10.0000 mg | Freq: Once | INTRAVENOUS | Status: AC
  Administered 2021-06-16: 10 mg via INTRAVENOUS
  Filled 2021-06-16: qty 10

## 2021-06-16 MED ORDER — HEPARIN SOD (PORK) LOCK FLUSH 100 UNIT/ML IV SOLN
500.0000 [IU] | Freq: Once | INTRAVENOUS | Status: AC | PRN
  Administered 2021-06-16: 500 [IU]
  Filled 2021-06-16: qty 5

## 2021-06-16 MED ORDER — SODIUM CHLORIDE 0.9 % IV SOLN
Freq: Once | INTRAVENOUS | Status: AC
  Filled 2021-06-16: qty 250

## 2021-06-16 MED ORDER — SODIUM CHLORIDE 0.9% FLUSH
10.0000 mL | INTRAVENOUS | Status: DC | PRN
  Administered 2021-06-16: 10 mL
  Filled 2021-06-16: qty 10

## 2021-06-16 MED ORDER — SODIUM CHLORIDE 0.9 % IV SOLN
75.0000 mg/m2 | Freq: Once | INTRAVENOUS | Status: AC
  Administered 2021-06-16: 165 mg via INTRAVENOUS
  Filled 2021-06-16: qty 16.5

## 2021-06-16 MED ORDER — PALONOSETRON HCL INJECTION 0.25 MG/5ML
0.2500 mg | Freq: Once | INTRAVENOUS | Status: AC
  Administered 2021-06-16: 0.25 mg via INTRAVENOUS

## 2021-06-16 NOTE — Patient Instructions (Addendum)
Azacitidine suspension for injection (subcutaneous use) What is this medicine? AZACITIDINE (ay za SITE i deen) is a chemotherapy drug. This medicine reduces the growth of cancer cells and can suppress the immune system. It is used for treating myelodysplastic syndrome or some types of leukemia. This medicine may be used for other purposes; ask your health care provider or pharmacist if you have questions. COMMON BRAND NAME(S): Vidaza What should I tell my health care provider before I take this medicine? They need to know if you have any of these conditions:  kidney disease  liver disease  liver tumors  an unusual or allergic reaction to azacitidine, mannitol, other medicines, foods, dyes, or preservatives  pregnant or trying to get pregnant  breast-feeding How should I use this medicine? This medicine is for injection under the skin. It is administered in a hospital or clinic by a specially trained health care professional. Talk to your pediatrician regarding the use of this medicine in children. While this drug may be prescribed for selected conditions, precautions do apply. Overdosage: If you think you have taken too much of this medicine contact a poison control center or emergency room at once. NOTE: This medicine is only for you. Do not share this medicine with others. What if I miss a dose? It is important not to miss your dose. Call your doctor or health care professional if you are unable to keep an appointment. What may interact with this medicine? Interactions have not been studied. Give your health care provider a list of all the medicines, herbs, non-prescription drugs, or dietary supplements you use. Also tell them if you smoke, drink alcohol, or use illegal drugs. Some items may interact with your medicine. This list may not describe all possible interactions. Give your health care provider a list of all the medicines, herbs, non-prescription drugs, or dietary supplements  you use. Also tell them if you smoke, drink alcohol, or use illegal drugs. Some items may interact with your medicine. What should I watch for while using this medicine? Visit your doctor for checks on your progress. This drug may make you feel generally unwell. This is not uncommon, as chemotherapy can affect healthy cells as well as cancer cells. Report any side effects. Continue your course of treatment even though you feel ill unless your doctor tells you to stop. In some cases, you may be given additional medicines to help with side effects. Follow all directions for their use. Call your doctor or health care professional for advice if you get a fever, chills or sore throat, or other symptoms of a cold or flu. Do not treat yourself. This drug decreases your body's ability to fight infections. Try to avoid being around people who are sick. This medicine may increase your risk to bruise or bleed. Call your doctor or health care professional if you notice any unusual bleeding. You may need blood work done while you are taking this medicine. Do not become pregnant while taking this medicine and for 6 months after the last dose. Women should inform their doctor if they wish to become pregnant or think they might be pregnant. Men should not father a child while taking this medicine and for 3 months after the last dose. There is a potential for serious side effects to an unborn child. Talk to your health care professional or pharmacist for more information. Do not breast-feed an infant while taking this medicine and for 1 week after the last dose. This medicine may interfere with   the ability to have a child. Talk with your doctor or health care professional if you are concerned about your fertility. What side effects may I notice from receiving this medicine? Side effects that you should report to your doctor or health care professional as soon as possible:  allergic reactions like skin rash, itching or  hives, swelling of the face, lips, or tongue  low blood counts - this medicine may decrease the number of white blood cells, red blood cells and platelets. You may be at increased risk for infections and bleeding.  signs of infection - fever or chills, cough, sore throat, pain passing urine  signs of decreased platelets or bleeding - bruising, pinpoint red spots on the skin, black, tarry stools, blood in the urine  signs of decreased red blood cells - unusually weak or tired, fainting spells, lightheadedness  signs and symptoms of kidney injury like trouble passing urine or change in the amount of urine  signs and symptoms of liver injury like dark yellow or brown urine; general ill feeling or flu-like symptoms; light-colored stools; loss of appetite; nausea; right upper belly pain; unusually weak or tired; yellowing of the eyes or skin Side effects that usually do not require medical attention (report to your doctor or health care professional if they continue or are bothersome):  constipation  diarrhea  nausea, vomiting  pain or redness at the injection site  unusually weak or tired This list may not describe all possible side effects. Call your doctor for medical advice about side effects. You may report side effects to FDA at 1-800-FDA-1088. Where should I keep my medicine? This drug is given in a hospital or clinic and will not be stored at home. NOTE: This sheet is a summary. It may not cover all possible information. If you have questions about this medicine, talk to your doctor, pharmacist, or health care provider.  2021 Elsevier/Gold Standard (2016-12-27 14:37:51)  

## 2021-06-16 NOTE — Progress Notes (Signed)
Dr. Marin Olp has reviewed patients lab results from 06/15/21 and has approved treatment for today.

## 2021-06-17 ENCOUNTER — Inpatient Hospital Stay: Payer: PPO

## 2021-06-17 ENCOUNTER — Other Ambulatory Visit: Payer: Self-pay

## 2021-06-17 VITALS — BP 108/50 | HR 83 | Temp 98.3°F

## 2021-06-17 DIAGNOSIS — D469 Myelodysplastic syndrome, unspecified: Secondary | ICD-10-CM | POA: Diagnosis not present

## 2021-06-17 DIAGNOSIS — G4733 Obstructive sleep apnea (adult) (pediatric): Secondary | ICD-10-CM | POA: Diagnosis not present

## 2021-06-17 DIAGNOSIS — D46Z Other myelodysplastic syndromes: Secondary | ICD-10-CM

## 2021-06-17 MED ORDER — SODIUM CHLORIDE 0.9 % IV SOLN
Freq: Once | INTRAVENOUS | Status: AC
  Filled 2021-06-17: qty 250

## 2021-06-17 MED ORDER — HEPARIN SOD (PORK) LOCK FLUSH 100 UNIT/ML IV SOLN
500.0000 [IU] | Freq: Once | INTRAVENOUS | Status: AC | PRN
  Administered 2021-06-17: 500 [IU]
  Filled 2021-06-17: qty 5

## 2021-06-17 MED ORDER — SODIUM CHLORIDE 0.9 % IV SOLN
10.0000 mg | Freq: Once | INTRAVENOUS | Status: AC
  Administered 2021-06-17: 10 mg via INTRAVENOUS
  Filled 2021-06-17: qty 1

## 2021-06-17 MED ORDER — SODIUM CHLORIDE 0.9 % IV SOLN
75.0000 mg/m2 | Freq: Once | INTRAVENOUS | Status: AC
  Administered 2021-06-17: 165 mg via INTRAVENOUS
  Filled 2021-06-17: qty 16.5

## 2021-06-17 MED ORDER — SODIUM CHLORIDE 0.9% FLUSH
10.0000 mL | INTRAVENOUS | Status: DC | PRN
  Administered 2021-06-17: 10 mL
  Filled 2021-06-17: qty 10

## 2021-06-17 NOTE — Patient Instructions (Signed)
[facility]  Discharge Instructions: Thank you for choosing [facility] to provide your oncology and hematology care.   If you have a lab appointment with the [facility], please go directly to the [facility] and check in at the registration area.  Wear comfortable clothing and clothing appropriate for easy access to any Portacath or PICC line.   We strive to give you quality time with your provider. You may need to reschedule your appointment if you arrive late (15 or more minutes).  Arriving late affects you and other patients whose appointments are after yours.  Also, if you miss three or more appointments without notifying the office, you may be dismissed from the clinic at the provider's discretion.      For prescription refill requests, have your pharmacy contact our office and allow 72 hours for refills to be completed.    Today you received the following chemotherapy and/or immunotherapy agents VidazaAzacitidine suspension for injection (subcutaneous use) What is this medication? AZACITIDINE (ay [place]) is a chemotherapy drug. This medicine reduces the growth of cancer cells and can suppress the immune system. It is used fortreating myelodysplastic syndrome or some types of leukemia. This medicine may be used for other purposes; ask your health care provider orpharmacist if you have questions. COMMON BRAND NAME(S): Vidaza What should I tell my care team before I take this medication? They need to know if you have any of these conditions: kidney disease liver disease liver tumors an unusual or allergic reaction to azacitidine, mannitol, other medicines, foods, dyes, or preservatives pregnant or trying to get pregnant breast-feeding How should I use this medication? This medicine is for injection under the skin. It is administered in a hospitalor clinic by a specially trained health care professional. Talk to your pediatrician  regarding the use of this medicine in children. Whilethis drug may be prescribed for selected conditions, precautions do apply. Overdosage: If you think you have taken too much of this medicine contact apoison control center or emergency room at once. NOTE: This medicine is only for you. Do not share this medicine with others. What if I miss a dose? It is important not to miss your dose. Call your doctor or health careprofessional if you are unable to keep an appointment. What may interact with this medication? Interactions have not been studied. Give your health care provider a list of all the medicines, herbs, non-prescription drugs, or dietary supplements you use. Also tell them if you smoke, drink alcohol, or use illegal drugs. Some items may interact with yourmedicine. This list may not describe all possible interactions. Give your health care provider a list of all the medicines, herbs, non-prescription drugs, or dietary supplements you use. Also tell them if you smoke, drink alcohol, or use illegaldrugs. Some items may interact with your medicine. What should I watch for while using this medication? Visit your doctor for checks on your progress. This drug may make you feel generally unwell. This is not uncommon, as chemotherapy can affect healthy cells as well as cancer cells. Report any side effects. Continue your course oftreatment even though you feel ill unless your doctor tells you to stop. In some cases, you may be given additional medicines to help with side effects.Follow all directions for their use. Call your doctor or health care professional for advice if you get a fever, chills or sore throat, or other symptoms of a cold or flu. Do not treat  yourself. This drug decreases your body's ability to fight infections. Try toavoid being around people who are sick. This medicine may increase your risk to bruise or bleed. Call your doctor orhealth care professional if you notice any unusual  bleeding. You may need blood work done while you are taking this medicine. Do not become pregnant while taking this medicine and for 6 months after the last dose. Women should inform their doctor if they wish to become pregnant or think they might be pregnant. Men should not father a child while taking this medicine and for 3 months after the last dose. There is a potential for serious side effects to an unborn child. Talk to your health care professional or pharmacist for more information. Do not breast-feed an infant while taking thismedicine and for 1 week after the last dose. This medicine may interfere with the ability to have a child. Talk with yourdoctor or health care professional if you are concerned about your fertility. What side effects may I notice from receiving this medication? Side effects that you should report to your doctor or health care professionalas soon as possible: allergic reactions like skin rash, itching or hives, swelling of the face, lips, or tongue low blood counts - this medicine may decrease the number of white blood cells, red blood cells and platelets. You may be at increased risk for infections and bleeding. signs of infection - fever or chills, cough, sore throat, pain passing urine signs of decreased platelets or bleeding - bruising, pinpoint red spots on the skin, black, tarry stools, blood in the urine signs of decreased red blood cells - unusually weak or tired, fainting spells, lightheadedness signs and symptoms of kidney injury like trouble passing urine or change in the amount of urine signs and symptoms of liver injury like dark yellow or brown urine; general ill feeling or flu-like symptoms; light-colored stools; loss of appetite; nausea; right upper belly pain; unusually weak or tired; yellowing of the eyes or skin Side effects that usually do not require medical attention (report to yourdoctor or health care professional if they continue or are  bothersome): constipation diarrhea nausea, vomiting pain or redness at the injection site unusually weak or tired This list may not describe all possible side effects. Call your doctor for medical advice about side effects. You may report side effects to FDA at1-800-FDA-1088. Where should I keep my medication? This drug is given in a hospital or clinic and will not be stored at home. NOTE: This sheet is a summary. It may not cover all possible information. If you have questions about this medicine, talk to your doctor, pharmacist, orhealth care provider.  2022 Elsevier/Gold Standard (2016-12-27 14:37:51)       To help prevent nausea and vomiting after your treatment, we encourage you to take your nausea medication as directed.  BELOW ARE SYMPTOMS THAT SHOULD BE REPORTED IMMEDIATELY: *FEVER GREATER THAN 100.4 F (38 C) OR HIGHER *CHILLS OR SWEATING *NAUSEA AND VOMITING THAT IS NOT CONTROLLED WITH YOUR NAUSEA MEDICATION *UNUSUAL SHORTNESS OF BREATH *UNUSUAL BRUISING OR BLEEDING *URINARY PROBLEMS (pain or burning when urinating, or frequent urination) *BOWEL PROBLEMS (unusual diarrhea, constipation, pain near the anus) TENDERNESS IN MOUTH AND THROAT WITH OR WITHOUT PRESENCE OF ULCERS (sore throat, sores in mouth, or a toothache) UNUSUAL RASH, SWELLING OR PAIN  UNUSUAL VAGINAL DISCHARGE OR ITCHING   Items with * indicate a potential emergency and should be followed up as soon as possible or go to the Emergency Department if  any problems should occur.  Please show the CHEMOTHERAPY ALERT CARD or IMMUNOTHERAPY ALERT CARD at check-in to the Emergency Department and triage nurse. Should you have questions after your visit or need to cancel or reschedule your appointment, please contact [facility]  [phone] and follow the prompts.  Office hours are 8:00 a.m. to 4:30 p.m. Monday - Friday. Please note that voicemails left after 4:00 p.m. may not be returned until  the following business day.  We are closed weekends and major holidays. You have access to a nurse at all times for urgent questions. Please call the main number to the clinic [phone] and follow the prompts.  For any non-urgent questions, you may also contact your provider using MyChart. We now offer e-Visits for anyone 59 and older to request care online for non-urgent symptoms. For details visit [email or web address].   Also download the MyChart app! Go to the app store, search "MyChart", open the app, select [facility], and log in with your MyChart username and password.  Due to Covid, a mask is required upon entering the hospital/clinic. If you do not have a mask, one will be given to you upon arrival. For doctor visits, patients may have 1 support person aged 66 or older with them. For treatment visits, patients cannot have anyone with them due to current Covid guidelines and our immunocompromised population.

## 2021-06-18 ENCOUNTER — Inpatient Hospital Stay: Payer: PPO

## 2021-06-18 VITALS — BP 93/40 | HR 80 | Temp 98.1°F | Resp 18

## 2021-06-18 DIAGNOSIS — D46Z Other myelodysplastic syndromes: Secondary | ICD-10-CM

## 2021-06-18 DIAGNOSIS — D469 Myelodysplastic syndrome, unspecified: Secondary | ICD-10-CM | POA: Diagnosis not present

## 2021-06-18 MED ORDER — SODIUM CHLORIDE 0.9% FLUSH
10.0000 mL | INTRAVENOUS | Status: DC | PRN
  Administered 2021-06-18: 10 mL
  Filled 2021-06-18: qty 10

## 2021-06-18 MED ORDER — SODIUM CHLORIDE 0.9 % IV SOLN
75.0000 mg/m2 | Freq: Once | INTRAVENOUS | Status: AC
  Administered 2021-06-18: 165 mg via INTRAVENOUS
  Filled 2021-06-18: qty 16.5

## 2021-06-18 MED ORDER — PALONOSETRON HCL INJECTION 0.25 MG/5ML
INTRAVENOUS | Status: AC
  Filled 2021-06-18: qty 5

## 2021-06-18 MED ORDER — SODIUM CHLORIDE 0.9 % IV SOLN
10.0000 mg | Freq: Once | INTRAVENOUS | Status: AC
  Administered 2021-06-18: 10 mg via INTRAVENOUS
  Filled 2021-06-18: qty 10

## 2021-06-18 MED ORDER — SODIUM CHLORIDE 0.9 % IV SOLN
Freq: Once | INTRAVENOUS | Status: AC
  Filled 2021-06-18: qty 250

## 2021-06-18 MED ORDER — HEPARIN SOD (PORK) LOCK FLUSH 100 UNIT/ML IV SOLN
500.0000 [IU] | Freq: Once | INTRAVENOUS | Status: AC | PRN
  Administered 2021-06-18: 500 [IU]
  Filled 2021-06-18: qty 5

## 2021-06-18 MED ORDER — PALONOSETRON HCL INJECTION 0.25 MG/5ML
0.2500 mg | Freq: Once | INTRAVENOUS | Status: AC
  Administered 2021-06-18: 0.25 mg via INTRAVENOUS

## 2021-06-18 NOTE — Patient Instructions (Signed)
[facility]  Discharge Instructions: Thank you for choosing [facility] to provide your oncology and hematology care.   If you have a lab appointment with the [facility], please go directly to the [facility] and check in at the registration area.  Wear comfortable clothing and clothing appropriate for easy access to any Portacath or PICC line.   We strive to give you quality time with your provider. You may need to reschedule your appointment if you arrive late (15 or more minutes).  Arriving late affects you and other patients whose appointments are after yours.  Also, if you miss three or more appointments without notifying the office, you may be dismissed from the clinic at the provider's discretion.      For prescription refill requests, have your pharmacy contact our office and allow 72 hours for refills to be completed.    Today you received the following chemotherapy and/or immunotherapy agents Vidaza.     To help prevent nausea and vomiting after your treatment, we encourage you to take your nausea medication as directed.  BELOW ARE SYMPTOMS THAT SHOULD BE REPORTED IMMEDIATELY: *FEVER GREATER THAN 100.4 F (38 C) OR HIGHER *CHILLS OR SWEATING *NAUSEA AND VOMITING THAT IS NOT CONTROLLED WITH YOUR NAUSEA MEDICATION *UNUSUAL SHORTNESS OF BREATH *UNUSUAL BRUISING OR BLEEDING *URINARY PROBLEMS (pain or burning when urinating, or frequent urination) *BOWEL PROBLEMS (unusual diarrhea, constipation, pain near the anus) TENDERNESS IN MOUTH AND THROAT WITH OR WITHOUT PRESENCE OF ULCERS (sore throat, sores in mouth, or a toothache) UNUSUAL RASH, SWELLING OR PAIN  UNUSUAL VAGINAL DISCHARGE OR ITCHING   Items with * indicate a potential emergency and should be followed up as soon as possible or go to the Emergency Department if any problems should occur.  Please show the CHEMOTHERAPY ALERT CARD or IMMUNOTHERAPY ALERT CARD at check-in to the  Emergency Department and triage nurse. Should you have questions after your visit or need to cancel or reschedule your appointment, please contact [facility]  [phone] and follow the prompts.  Office hours are 8:00 a.m. to 4:30 p.m. Monday - Friday. Please note that voicemails left after 4:00 p.m. may not be returned until the following business day.  We are closed weekends and major holidays. You have access to a nurse at all times for urgent questions. Please call the main number to the clinic [phone] and follow the prompts.  For any non-urgent questions, you may also contact your provider using MyChart. We now offer e-Visits for anyone 25 and older to request care online for non-urgent symptoms. For details visit [email or web address].   Also download the MyChart app! Go to the app store, search "MyChart", open the app, select [facility], and log in with your MyChart username and password.  Due to Covid, a mask is required upon entering the hospital/clinic. If you do not have a mask, one will be given to you upon arrival. For doctor visits, patients may have 1 support person aged 52 or older with them. For treatment visits, patients cannot have anyone with them due to current Covid guidelines and our immunocompromised population.

## 2021-06-21 ENCOUNTER — Inpatient Hospital Stay: Payer: PPO

## 2021-06-21 ENCOUNTER — Other Ambulatory Visit: Payer: Self-pay

## 2021-06-21 ENCOUNTER — Telehealth: Payer: Self-pay | Admitting: *Deleted

## 2021-06-21 ENCOUNTER — Other Ambulatory Visit: Payer: Self-pay | Admitting: *Deleted

## 2021-06-21 DIAGNOSIS — D469 Myelodysplastic syndrome, unspecified: Secondary | ICD-10-CM | POA: Diagnosis not present

## 2021-06-21 DIAGNOSIS — D46Z Other myelodysplastic syndromes: Secondary | ICD-10-CM

## 2021-06-21 DIAGNOSIS — D649 Anemia, unspecified: Secondary | ICD-10-CM

## 2021-06-21 LAB — CBC WITH DIFFERENTIAL (CANCER CENTER ONLY)
Abs Immature Granulocytes: 0.06 10*3/uL (ref 0.00–0.07)
Basophils Absolute: 0 10*3/uL (ref 0.0–0.1)
Basophils Relative: 0 %
Eosinophils Absolute: 0 10*3/uL (ref 0.0–0.5)
Eosinophils Relative: 0 %
HCT: 17.5 % — ABNORMAL LOW (ref 39.0–52.0)
Hemoglobin: 5.8 g/dL — CL (ref 13.0–17.0)
Immature Granulocytes: 7 %
Lymphocytes Relative: 30 %
Lymphs Abs: 0.3 10*3/uL — ABNORMAL LOW (ref 0.7–4.0)
MCH: 30.2 pg (ref 26.0–34.0)
MCHC: 33.1 g/dL (ref 30.0–36.0)
MCV: 91.1 fL (ref 80.0–100.0)
Monocytes Absolute: 0.1 10*3/uL (ref 0.1–1.0)
Monocytes Relative: 10 %
Neutro Abs: 0.5 10*3/uL — ABNORMAL LOW (ref 1.7–7.7)
Neutrophils Relative %: 53 %
Platelet Count: 17 10*3/uL — ABNORMAL LOW (ref 150–400)
RBC: 1.92 MIL/uL — ABNORMAL LOW (ref 4.22–5.81)
RDW: 16.3 % — ABNORMAL HIGH (ref 11.5–15.5)
WBC Count: 0.8 10*3/uL — CL (ref 4.0–10.5)
nRBC: 2.4 % — ABNORMAL HIGH (ref 0.0–0.2)

## 2021-06-21 LAB — BASIC METABOLIC PANEL - CANCER CENTER ONLY
Anion gap: 8 (ref 5–15)
BUN: 27 mg/dL — ABNORMAL HIGH (ref 8–23)
CO2: 21 mmol/L — ABNORMAL LOW (ref 22–32)
Calcium: 9.1 mg/dL (ref 8.9–10.3)
Chloride: 94 mmol/L — ABNORMAL LOW (ref 98–111)
Creatinine: 1.68 mg/dL — ABNORMAL HIGH (ref 0.61–1.24)
GFR, Estimated: 43 mL/min — ABNORMAL LOW (ref >60)
Glucose, Bld: 167 mg/dL — ABNORMAL HIGH (ref 70–99)
Potassium: 4.1 mmol/L (ref 3.5–5.1)
Sodium: 123 mmol/L — ABNORMAL LOW (ref 135–145)

## 2021-06-21 LAB — PREPARE RBC (CROSSMATCH)

## 2021-06-21 MED ORDER — SODIUM CHLORIDE 0.9% FLUSH
10.0000 mL | Freq: Once | INTRAVENOUS | Status: AC
Start: 2021-06-21 — End: 2021-06-21
  Administered 2021-06-21: 10 mL via INTRAVENOUS
  Filled 2021-06-21: qty 10

## 2021-06-21 MED ORDER — HEPARIN SOD (PORK) LOCK FLUSH 100 UNIT/ML IV SOLN
500.0000 [IU] | Freq: Once | INTRAVENOUS | Status: AC
  Administered 2021-06-21: 500 [IU] via INTRAVENOUS
  Filled 2021-06-21: qty 5

## 2021-06-21 MED ORDER — SODIUM CHLORIDE 0.9 % IV SOLN
75.0000 mg/m2 | Freq: Once | INTRAVENOUS | Status: AC
  Administered 2021-06-21: 165 mg via INTRAVENOUS
  Filled 2021-06-21: qty 16.5

## 2021-06-21 MED ORDER — SODIUM CHLORIDE 0.9 % IV SOLN
10.0000 mg | Freq: Once | INTRAVENOUS | Status: AC
  Administered 2021-06-21: 10 mg via INTRAVENOUS
  Filled 2021-06-21: qty 10

## 2021-06-21 MED ORDER — PALONOSETRON HCL INJECTION 0.25 MG/5ML
INTRAVENOUS | Status: AC
  Filled 2021-06-21: qty 5

## 2021-06-21 MED ORDER — SODIUM CHLORIDE 0.9 % IV SOLN
Freq: Once | INTRAVENOUS | Status: AC
  Filled 2021-06-21: qty 250

## 2021-06-21 MED ORDER — PALONOSETRON HCL INJECTION 0.25 MG/5ML
0.2500 mg | Freq: Once | INTRAVENOUS | Status: AC
  Administered 2021-06-21: 0.25 mg via INTRAVENOUS

## 2021-06-21 NOTE — Patient Instructions (Signed)
Implanted Port Home Guide An implanted port is a device that is placed under the skin. It is usually placed in the chest. The device can be used to give IV medicine, to take blood, or for dialysis. You may have an implanted port if: You need IV medicine that would be irritating to the small veins in your hands or arms. You need IV medicines, such as antibiotics, for a long period of time. You need IV nutrition for a long period of time. You need dialysis. When you have a port, your health care provider can choose to use the port instead of veins in your arms for these procedures. You may have fewer limitations when using a port than you would if you used other types of long-term IVs, and you will likely be able to return to normal activities afteryour incision heals. An implanted port has two main parts: Reservoir. The reservoir is the part where a needle is inserted to give medicines or draw blood. The reservoir is round. After it is placed, it appears as a small, raised area under your skin. Catheter. The catheter is a thin, flexible tube that connects the reservoir to a vein. Medicine that is inserted into the reservoir goes into the catheter and then into the vein. How is my port accessed? To access your port: A numbing cream may be placed on the skin over the port site. Your health care provider will put on a mask and sterile gloves. The skin over your port will be cleaned carefully with a germ-killing soap and allowed to dry. Your health care provider will gently pinch the port and insert a needle into it. Your health care provider will check for a blood return to make sure the port is in the vein and is not clogged. If your port needs to remain accessed to get medicine continuously (constant infusion), your health care provider will place a clear bandage (dressing) over the needle site. The dressing and needle will need to be changed every week, or as told by your health care provider. What  is flushing? Flushing helps keep the port from getting clogged. Follow instructions from your health care provider about how and when to flush the port. Ports are usually flushed with saline solution or a medicine called heparin. The need for flushing will depend on how the port is used: If the port is only used from time to time to give medicines or draw blood, the port may need to be flushed: Before and after medicines have been given. Before and after blood has been drawn. As part of routine maintenance. Flushing may be recommended every 4-6 weeks. If a constant infusion is running, the port may not need to be flushed. Throw away any syringes in a disposal container that is meant for sharp items (sharps container). You can buy a sharps container from a pharmacy, or you can make one by using an empty hard plastic bottle with a cover. How long will my port stay implanted? The port can stay in for as long as your health care provider thinks it is needed. When it is time for the port to come out, a surgery will be done to remove it. The surgery will be similar to the procedure that was done to putthe port in. Follow these instructions at home:  Flush your port as told by your health care provider. If you need an infusion over several days, follow instructions from your health care provider about how to take   care of your port site. Make sure you: Wash your hands with soap and water before you change your dressing. If soap and water are not available, use alcohol-based hand sanitizer. Change your dressing as told by your health care provider. Place any used dressings or infusion bags into a plastic bag. Throw that bag in the trash. Keep the dressing that covers the needle clean and dry. Do not get it wet. Do not use scissors or sharp objects near the tube. Keep the tube clamped, unless it is being used. Check your port site every day for signs of infection. Check for: Redness, swelling, or  pain. Fluid or blood. Pus or a bad smell. Protect the skin around the port site. Avoid wearing bra straps that rub or irritate the site. Protect the skin around your port from seat belts. Place a soft pad over your chest if needed. Bathe or shower as told by your health care provider. The site may get wet as long as you are not actively receiving an infusion. Return to your normal activities as told by your health care provider. Ask your health care provider what activities are safe for you. Carry a medical alert card or wear a medical alert bracelet at all times. This will let health care providers know that you have an implanted port in case of an emergency. Get help right away if: You have redness, swelling, or pain at the port site. You have fluid or blood coming from your port site. You have pus or a bad smell coming from the port site. You have a fever. Summary Implanted ports are usually placed in the chest for long-term IV access. Follow instructions from your health care provider about flushing the port and changing bandages (dressings). Take care of the area around your port by avoiding clothing that puts pressure on the area, and by watching for signs of infection. Protect the skin around your port from seat belts. Place a soft pad over your chest if needed. Get help right away if you have a fever or you have redness, swelling, pain, drainage, or a bad smell at the port site. This information is not intended to replace advice given to you by your health care provider. Make sure you discuss any questions you have with your healthcare provider. Document Revised: 04/13/2020 Document Reviewed: 04/13/2020 Elsevier Patient Education  2022 [employer]  

## 2021-06-21 NOTE — Progress Notes (Signed)
ty

## 2021-06-21 NOTE — Telephone Encounter (Signed)
Panic values reported by [facility] in lab.  Hgb 5.8.  Platelets 17, WBC .8.  Dr Marin Olp notified.   2 u prbcs ordered to be infused tomorrow.  Ok to treat with remaining chemo this week

## 2021-06-21 NOTE — Patient Instructions (Signed)
[facility]  Discharge Instructions: Thank you for choosing [facility] to provide your oncology and hematology care.   If you have a lab appointment with the [facility], please go directly to the [facility] and check in at the registration area.  Wear comfortable clothing and clothing appropriate for easy access to any Portacath or PICC line.   We strive to give you quality time with your provider. You may need to reschedule your appointment if you arrive late (15 or more minutes).  Arriving late affects you and other patients whose appointments are after yours.  Also, if you miss three or more appointments without notifying the office, you may be dismissed from the clinic at the provider's discretion.      For prescription refill requests, have your pharmacy contact our office and allow 72 hours for refills to be completed.    Today you received the following chemotherapy and/or immunotherapy agents VidazaAzacitidine suspension for injection (subcutaneous use) What is this medication? AZACITIDINE (ay [place]) is a chemotherapy drug. This medicine reduces the growth of cancer cells and can suppress the immune system. It is used fortreating myelodysplastic syndrome or some types of leukemia. This medicine may be used for other purposes; ask your health care provider orpharmacist if you have questions. COMMON BRAND NAME(S): Vidaza What should I tell my care team before I take this medication? They need to know if you have any of these conditions: kidney disease liver disease liver tumors an unusual or allergic reaction to azacitidine, mannitol, other medicines, foods, dyes, or preservatives pregnant or trying to get pregnant breast-feeding How should I use this medication? This medicine is for injection under the skin. It is administered in a hospitalor clinic by a specially trained health care professional. Talk to your pediatrician  regarding the use of this medicine in children. Whilethis drug may be prescribed for selected conditions, precautions do apply. Overdosage: If you think you have taken too much of this medicine contact apoison control center or emergency room at once. NOTE: This medicine is only for you. Do not share this medicine with others. What if I miss a dose? It is important not to miss your dose. Call your doctor or health careprofessional if you are unable to keep an appointment. What may interact with this medication? Interactions have not been studied. Give your health care provider a list of all the medicines, herbs, non-prescription drugs, or dietary supplements you use. Also tell them if you smoke, drink alcohol, or use illegal drugs. Some items may interact with yourmedicine. This list may not describe all possible interactions. Give your health care provider a list of all the medicines, herbs, non-prescription drugs, or dietary supplements you use. Also tell them if you smoke, drink alcohol, or use illegaldrugs. Some items may interact with your medicine. What should I watch for while using this medication? Visit your doctor for checks on your progress. This drug may make you feel generally unwell. This is not uncommon, as chemotherapy can affect healthy cells as well as cancer cells. Report any side effects. Continue your course oftreatment even though you feel ill unless your doctor tells you to stop. In some cases, you may be given additional medicines to help with side effects.Follow all directions for their use. Call your doctor or health care professional for advice if you get a fever, chills or sore throat, or other symptoms of a cold or flu. Do not treat  yourself. This drug decreases your body's ability to fight infections. Try toavoid being around people who are sick. This medicine may increase your risk to bruise or bleed. Call your doctor orhealth care professional if you notice any unusual  bleeding. You may need blood work done while you are taking this medicine. Do not become pregnant while taking this medicine and for 6 months after the last dose. Women should inform their doctor if they wish to become pregnant or think they might be pregnant. Men should not father a child while taking this medicine and for 3 months after the last dose. There is a potential for serious side effects to an unborn child. Talk to your health care professional or pharmacist for more information. Do not breast-feed an infant while taking thismedicine and for 1 week after the last dose. This medicine may interfere with the ability to have a child. Talk with yourdoctor or health care professional if you are concerned about your fertility. What side effects may I notice from receiving this medication? Side effects that you should report to your doctor or health care professionalas soon as possible: allergic reactions like skin rash, itching or hives, swelling of the face, lips, or tongue low blood counts - this medicine may decrease the number of white blood cells, red blood cells and platelets. You may be at increased risk for infections and bleeding. signs of infection - fever or chills, cough, sore throat, pain passing urine signs of decreased platelets or bleeding - bruising, pinpoint red spots on the skin, black, tarry stools, blood in the urine signs of decreased red blood cells - unusually weak or tired, fainting spells, lightheadedness signs and symptoms of kidney injury like trouble passing urine or change in the amount of urine signs and symptoms of liver injury like dark yellow or brown urine; general ill feeling or flu-like symptoms; light-colored stools; loss of appetite; nausea; right upper belly pain; unusually weak or tired; yellowing of the eyes or skin Side effects that usually do not require medical attention (report to yourdoctor or health care professional if they continue or are  bothersome): constipation diarrhea nausea, vomiting pain or redness at the injection site unusually weak or tired This list may not describe all possible side effects. Call your doctor for medical advice about side effects. You may report side effects to FDA at1-800-FDA-1088. Where should I keep my medication? This drug is given in a hospital or clinic and will not be stored at home. NOTE: This sheet is a summary. It may not cover all possible information. If you have questions about this medicine, talk to your doctor, pharmacist, orhealth care provider.  2022 Elsevier/Gold Standard (2016-12-27 14:37:51)       To help prevent nausea and vomiting after your treatment, we encourage you to take your nausea medication as directed.  BELOW ARE SYMPTOMS THAT SHOULD BE REPORTED IMMEDIATELY: *FEVER GREATER THAN 100.4 F (38 C) OR HIGHER *CHILLS OR SWEATING *NAUSEA AND VOMITING THAT IS NOT CONTROLLED WITH YOUR NAUSEA MEDICATION *UNUSUAL SHORTNESS OF BREATH *UNUSUAL BRUISING OR BLEEDING *URINARY PROBLEMS (pain or burning when urinating, or frequent urination) *BOWEL PROBLEMS (unusual diarrhea, constipation, pain near the anus) TENDERNESS IN MOUTH AND THROAT WITH OR WITHOUT PRESENCE OF ULCERS (sore throat, sores in mouth, or a toothache) UNUSUAL RASH, SWELLING OR PAIN  UNUSUAL VAGINAL DISCHARGE OR ITCHING   Items with * indicate a potential emergency and should be followed up as soon as possible or go to the Emergency Department if  any problems should occur.  Please show the CHEMOTHERAPY ALERT CARD or IMMUNOTHERAPY ALERT CARD at check-in to the Emergency Department and triage nurse. Should you have questions after your visit or need to cancel or reschedule your appointment, please contact [facility]  [phone] and follow the prompts.  Office hours are 8:00 a.m. to 4:30 p.m. Monday - Friday. Please note that voicemails left after 4:00 p.m. may not be returned until  the following business day.  We are closed weekends and major holidays. You have access to a nurse at all times for urgent questions. Please call the main number to the clinic [phone] and follow the prompts.  For any non-urgent questions, you may also contact your provider using MyChart. We now offer e-Visits for anyone 7 and older to request care online for non-urgent symptoms. For details visit [email or web address].   Also download the MyChart app! Go to the app store, search "MyChart", open the app, select [facility], and log in with your MyChart username and password.  Due to Covid, a mask is required upon entering the hospital/clinic. If you do not have a mask, one will be given to you upon arrival. For doctor visits, patients may have 1 support person aged 65 or older with them. For treatment visits, patients cannot have anyone with them due to current Covid guidelines and our immunocompromised population.

## 2021-06-22 ENCOUNTER — Inpatient Hospital Stay: Payer: PPO

## 2021-06-22 ENCOUNTER — Other Ambulatory Visit: Payer: PPO

## 2021-06-22 VITALS — BP 114/51 | HR 78 | Temp 98.1°F | Resp 17

## 2021-06-22 DIAGNOSIS — D649 Anemia, unspecified: Secondary | ICD-10-CM

## 2021-06-22 DIAGNOSIS — D469 Myelodysplastic syndrome, unspecified: Secondary | ICD-10-CM | POA: Diagnosis not present

## 2021-06-22 DIAGNOSIS — D46Z Other myelodysplastic syndromes: Secondary | ICD-10-CM

## 2021-06-22 MED ORDER — SODIUM CHLORIDE 0.9% IV SOLUTION
250.0000 mL | Freq: Once | INTRAVENOUS | Status: DC
Start: 2021-06-22 — End: 2021-06-22
  Filled 2021-06-22: qty 250

## 2021-06-22 MED ORDER — SODIUM CHLORIDE 0.9% FLUSH
10.0000 mL | INTRAVENOUS | Status: DC | PRN
  Administered 2021-06-22: 10 mL
  Filled 2021-06-22: qty 10

## 2021-06-22 MED ORDER — SODIUM CHLORIDE 0.9 % IV SOLN
75.0000 mg/m2 | Freq: Once | INTRAVENOUS | Status: AC
  Administered 2021-06-22: 165 mg via INTRAVENOUS
  Filled 2021-06-22: qty 16.5

## 2021-06-22 MED ORDER — SODIUM CHLORIDE 0.9 % IV SOLN
10.0000 mg | Freq: Once | INTRAVENOUS | Status: AC
Start: 2021-06-22 — End: 2021-06-22
  Administered 2021-06-22: 10 mg via INTRAVENOUS
  Filled 2021-06-22: qty 10

## 2021-06-22 MED ORDER — SODIUM CHLORIDE 0.9 % IV SOLN
Freq: Once | INTRAVENOUS | Status: AC
  Filled 2021-06-22: qty 250

## 2021-06-22 MED ORDER — HEPARIN SOD (PORK) LOCK FLUSH 100 UNIT/ML IV SOLN
500.0000 [IU] | Freq: Once | INTRAVENOUS | Status: AC | PRN
  Administered 2021-06-22: 500 [IU]
  Filled 2021-06-22: qty 5

## 2021-06-22 NOTE — Patient Instructions (Signed)
[facility]  Discharge Instructions: Thank you for choosing [facility] to provide your oncology and hematology care.   If you have a lab appointment with the [facility], please go directly to the [facility] and check in at the registration area.  Wear comfortable clothing and clothing appropriate for easy access to any Portacath or PICC line.   We strive to give you quality time with your provider. You may need to reschedule your appointment if you arrive late (15 or more minutes).  Arriving late affects you and other patients whose appointments are after yours.  Also, if you miss three or more appointments without notifying the office, you may be dismissed from the clinic at the provider's discretion.      For prescription refill requests, have your pharmacy contact our office and allow 72 hours for refills to be completed.    Today you received the following chemotherapy and/or immunotherapy agents VidazaAzacitidine suspension for injection (subcutaneous use) What is this medication? AZACITIDINE (ay [place]) is a chemotherapy drug. This medicine reduces the growth of cancer cells and can suppress the immune system. It is used fortreating myelodysplastic syndrome or some types of leukemia. This medicine may be used for other purposes; ask your health care provider orpharmacist if you have questions. COMMON BRAND NAME(S): Vidaza What should I tell my care team before I take this medication? They need to know if you have any of these conditions: kidney disease liver disease liver tumors an unusual or allergic reaction to azacitidine, mannitol, other medicines, foods, dyes, or preservatives pregnant or trying to get pregnant breast-feeding How should I use this medication? This medicine is for injection under the skin. It is administered in a hospitalor clinic by a specially trained health care professional. Talk to your pediatrician  regarding the use of this medicine in children. Whilethis drug may be prescribed for selected conditions, precautions do apply. Overdosage: If you think you have taken too much of this medicine contact apoison control center or emergency room at once. NOTE: This medicine is only for you. Do not share this medicine with others. What if I miss a dose? It is important not to miss your dose. Call your doctor or health careprofessional if you are unable to keep an appointment. What may interact with this medication? Interactions have not been studied. Give your health care provider a list of all the medicines, herbs, non-prescription drugs, or dietary supplements you use. Also tell them if you smoke, drink alcohol, or use illegal drugs. Some items may interact with yourmedicine. This list may not describe all possible interactions. Give your health care provider a list of all the medicines, herbs, non-prescription drugs, or dietary supplements you use. Also tell them if you smoke, drink alcohol, or use illegaldrugs. Some items may interact with your medicine. What should I watch for while using this medication? Visit your doctor for checks on your progress. This drug may make you feel generally unwell. This is not uncommon, as chemotherapy can affect healthy cells as well as cancer cells. Report any side effects. Continue your course oftreatment even though you feel ill unless your doctor tells you to stop. In some cases, you may be given additional medicines to help with side effects.Follow all directions for their use. Call your doctor or health care professional for advice if you get a fever, chills or sore throat, or other symptoms of a cold or flu. Do not treat  yourself. This drug decreases your body's ability to fight infections. Try toavoid being around people who are sick. This medicine may increase your risk to bruise or bleed. Call your doctor orhealth care professional if you notice any unusual  bleeding. You may need blood work done while you are taking this medicine. Do not become pregnant while taking this medicine and for 6 months after the last dose. Women should inform their doctor if they wish to become pregnant or think they might be pregnant. Men should not father a child while taking this medicine and for 3 months after the last dose. There is a potential for serious side effects to an unborn child. Talk to your health care professional or pharmacist for more information. Do not breast-feed an infant while taking thismedicine and for 1 week after the last dose. This medicine may interfere with the ability to have a child. Talk with yourdoctor or health care professional if you are concerned about your fertility. What side effects may I notice from receiving this medication? Side effects that you should report to your doctor or health care professionalas soon as possible: allergic reactions like skin rash, itching or hives, swelling of the face, lips, or tongue low blood counts - this medicine may decrease the number of white blood cells, red blood cells and platelets. You may be at increased risk for infections and bleeding. signs of infection - fever or chills, cough, sore throat, pain passing urine signs of decreased platelets or bleeding - bruising, pinpoint red spots on the skin, black, tarry stools, blood in the urine signs of decreased red blood cells - unusually weak or tired, fainting spells, lightheadedness signs and symptoms of kidney injury like trouble passing urine or change in the amount of urine signs and symptoms of liver injury like dark yellow or brown urine; general ill feeling or flu-like symptoms; light-colored stools; loss of appetite; nausea; right upper belly pain; unusually weak or tired; yellowing of the eyes or skin Side effects that usually do not require medical attention (report to yourdoctor or health care professional if they continue or are  bothersome): constipation diarrhea nausea, vomiting pain or redness at the injection site unusually weak or tired This list may not describe all possible side effects. Call your doctor for medical advice about side effects. You may report side effects to FDA at1-800-FDA-1088. Where should I keep my medication? This drug is given in a hospital or clinic and will not be stored at home. NOTE: This sheet is a summary. It may not cover all possible information. If you have questions about this medicine, talk to your doctor, pharmacist, orhealth care provider.  2022 Elsevier/Gold Standard (2016-12-27 14:37:51)       To help prevent nausea and vomiting after your treatment, we encourage you to take your nausea medication as directed.  BELOW ARE SYMPTOMS THAT SHOULD BE REPORTED IMMEDIATELY: *FEVER GREATER THAN 100.4 F (38 C) OR HIGHER *CHILLS OR SWEATING *NAUSEA AND VOMITING THAT IS NOT CONTROLLED WITH YOUR NAUSEA MEDICATION *UNUSUAL SHORTNESS OF BREATH *UNUSUAL BRUISING OR BLEEDING *URINARY PROBLEMS (pain or burning when urinating, or frequent urination) *BOWEL PROBLEMS (unusual diarrhea, constipation, pain near the anus) TENDERNESS IN MOUTH AND THROAT WITH OR WITHOUT PRESENCE OF ULCERS (sore throat, sores in mouth, or a toothache) UNUSUAL RASH, SWELLING OR PAIN  UNUSUAL VAGINAL DISCHARGE OR ITCHING   Items with * indicate a potential emergency and should be followed up as soon as possible or go to the Emergency Department if  any problems should occur.  Please show the CHEMOTHERAPY ALERT CARD or IMMUNOTHERAPY ALERT CARD at check-in to the Emergency Department and triage nurse. Should you have questions after your visit or need to cancel or reschedule your appointment, please contact [facility]  [phone] and follow the prompts.  Office hours are 8:00 a.m. to 4:30 p.m. Monday - Friday. Please note that voicemails left after 4:00 p.m. may not be returned until  the following business day.  We are closed weekends and major holidays. You have access to a nurse at all times for urgent questions. Please call the main number to the clinic [phone] and follow the prompts.  For any non-urgent questions, you may also contact your provider using MyChart. We now offer e-Visits for anyone 71 and older to request care online for non-urgent symptoms. For details visit [email or web address].   Also download the MyChart app! Go to the app store, search "MyChart", open the app, select [facility], and log in with your MyChart username and password.  Due to Covid, a mask is required upon entering the hospital/clinic. If you do not have a mask, one will be given to you upon arrival. For doctor visits, patients may have 1 support person aged 55 or older with them. For treatment visits, patients cannot have anyone with them due to current Covid guidelines and our immunocompromised population. https://www.redcrossblood.org/donate-blood/blood-donation-process/what-happens-to-donated-blood/blood-transfusions/types-of-blood-transfusions.html"> https://www.hematology.org/education/patients/blood-basics/blood-safety-and-matching"> https://www.nhlbi.nih.gov/health-topics/blood-transfusion">  Blood Transfusion, Adult A blood transfusion is a procedure in which you receive blood or a type of blood cell (blood component) through an IV. You may need a blood transfusion when your blood level is low. This may result from a bleeding disorder, illness, injury, or surgery. The blood may come from a donor. You may also be able to donate blood for yourself (autologous blood donation) before a planned surgery. The blood given in a transfusion is made up of different blood components. You may receive: Red blood cells. These carry oxygen to the cells in the body. Platelets. These help your blood to clot. Plasma. This is the liquid part of your blood. It carries proteins and other substances  throughout the body. White blood cells. These help you fight infections. If you have hemophilia or another clotting disorder, you may also receive othertypes of blood products. Tell a health care provider about: Any blood disorders you have. Any previous reactions you have had during a blood transfusion. Any allergies you have. All medicines you are taking, including vitamins, herbs, eye drops, creams, and over-the-counter medicines. Any surgeries you have had. Any medical conditions you have, including any recent fever or cold symptoms. Whether you are pregnant or may be pregnant. What are the risks? Generally, this is a safe procedure. However, problems may occur. The most common problems include: A mild allergic reaction, such as red, swollen areas of skin (hives) and itching. Fever or chills. This may be the body's response to new blood cells received. This may occur during or up to 4 hours after the transfusion. More serious problems may include: Transfusion-associated circulatory overload (TACO), or too much fluid in the lungs. This may cause breathing problems. A serious allergic reaction, such as difficulty breathing or swelling around the face and lips. Transfusion-related acute lung injury (TRALI), which causes breathing difficulty and low oxygen in the blood. This can occur within hours of the transfusion or several days later. Iron overload. This can happen after receiving many blood transfusions over a period of time. Infection or virus being transmitted. This  is rare because donated blood is carefully tested before it is given. Hemolytic transfusion reaction. This is rare. It happens when your body's defense system (immune system)tries to attack the new blood cells. Symptoms may include fever, chills, nausea, low blood pressure, and low back or chest pain. Transfusion-associated graft-versus-host disease (TAGVHD). This is rare. It happens when donated cells attack your body's  healthy tissues. What happens before the procedure? Medicines Ask your health care provider about: Changing or stopping your regular medicines. This is especially important if you are taking diabetes medicines or blood thinners. Taking medicines such as aspirin and ibuprofen. These medicines can thin your blood. Do not take these medicines unless your health care provider tells you to take them. Taking over-the-counter medicines, vitamins, herbs, and supplements. General instructions Follow instructions from your health care provider about eating and drinking restrictions. You will have a blood test to determine your blood type. This is necessary to know what kind of blood your body will accept and to match it to the donor blood. If you are going to have a planned surgery, you may be able to do an autologous blood donation. This may be done in case you need to have a transfusion. You will have your temperature, blood pressure, and pulse monitored before the transfusion. If you have had an allergic reaction to a transfusion in the past, you may be given medicine to help prevent a reaction. This medicine may be given to you by mouth (orally) or through an IV. Set aside time for the blood transfusion. This procedure generally takes 1-4 hours to complete. What happens during the procedure?  An IV will be inserted into one of your veins. The bag of donated blood will be attached to your IV. The blood will then enter through your vein. Your temperature, blood pressure, and pulse will be monitored regularly during the transfusion. This monitoring is done to detect early signs of a transfusion reaction. Tell your nurse right away if you have any of these symptoms during the transfusion: Shortness of breath or trouble breathing. Chest or back pain. Fever or chills. Hives or itching. If you have any signs or symptoms of a reaction, your transfusion will be stopped and you may be given medicine. When  the transfusion is complete, your IV will be removed. Pressure may be applied to the IV site for a few minutes. A bandage (dressing)will be applied. The procedure may vary among health care providers and hospitals. What happens after the procedure? Your temperature, blood pressure, pulse, breathing rate, and blood oxygen level will be monitored until you leave the hospital or clinic. Your blood may be tested to see how you are responding to the transfusion. You may be warmed with fluids or blankets to maintain a normal body temperature. If you receive your blood transfusion in an outpatient setting, you will be told whom to contact to report any reactions. Where to find more information For more information on blood transfusions, visit the American Red Cross: redcross.org Summary A blood transfusion is a procedure in which you receive blood or a type of blood cell (blood component) through an IV. The blood you receive may come from a donor or be donated by yourself (autologous blood donation) before a planned surgery. The blood given in a transfusion is made up of different blood components. You may receive red blood cells, platelets, plasma, or white blood cells depending on the condition treated. Your temperature, blood pressure, and pulse will be monitored before,  during, and after the transfusion. After the transfusion, your blood may be tested to see how your body has responded. This information is not intended to replace advice given to you by your health care provider. Make sure you discuss any questions you have with your healthcare provider. Document Revised: 10/03/2019 Document Reviewed: 05/23/2019 Elsevier Patient Education  [place].

## 2021-06-23 ENCOUNTER — Other Ambulatory Visit: Payer: PPO

## 2021-06-23 ENCOUNTER — Other Ambulatory Visit: Payer: Self-pay

## 2021-06-23 ENCOUNTER — Inpatient Hospital Stay: Payer: PPO

## 2021-06-23 ENCOUNTER — Other Ambulatory Visit: Payer: Self-pay | Admitting: *Deleted

## 2021-06-23 VITALS — BP 111/50 | HR 80 | Temp 98.6°F | Resp 16

## 2021-06-23 DIAGNOSIS — D649 Anemia, unspecified: Secondary | ICD-10-CM

## 2021-06-23 DIAGNOSIS — D46Z Other myelodysplastic syndromes: Secondary | ICD-10-CM

## 2021-06-23 DIAGNOSIS — D469 Myelodysplastic syndrome, unspecified: Secondary | ICD-10-CM | POA: Diagnosis not present

## 2021-06-23 LAB — BPAM RBC
Blood Product Expiration Date: 202208012359
Blood Product Expiration Date: 202208022359
ISSUE DATE / TIME: 202207120812
ISSUE DATE / TIME: 202207120812
Unit Type and Rh: 6200
Unit Type and Rh: 6200

## 2021-06-23 LAB — TYPE AND SCREEN
ABO/RH(D): A POS
Antibody Screen: NEGATIVE
Unit division: 0
Unit division: 0

## 2021-06-23 MED ORDER — SODIUM CHLORIDE 0.9 % IV SOLN
10.0000 mg | Freq: Once | INTRAVENOUS | Status: AC
  Administered 2021-06-23: 10 mg via INTRAVENOUS
  Filled 2021-06-23: qty 10

## 2021-06-23 MED ORDER — SODIUM CHLORIDE 0.9% FLUSH
10.0000 mL | INTRAVENOUS | Status: DC | PRN
  Administered 2021-06-23: 10 mL
  Filled 2021-06-23: qty 10

## 2021-06-23 MED ORDER — PALONOSETRON HCL INJECTION 0.25 MG/5ML
0.2500 mg | Freq: Once | INTRAVENOUS | Status: AC
Start: 2021-06-23 — End: 2021-06-23
  Administered 2021-06-23: 0.25 mg via INTRAVENOUS

## 2021-06-23 MED ORDER — SODIUM CHLORIDE 0.9 % IV SOLN
75.0000 mg/m2 | Freq: Once | INTRAVENOUS | Status: AC
  Administered 2021-06-23: 165 mg via INTRAVENOUS
  Filled 2021-06-23: qty 16.5

## 2021-06-23 MED ORDER — HEPARIN SOD (PORK) LOCK FLUSH 100 UNIT/ML IV SOLN
500.0000 [IU] | Freq: Once | INTRAVENOUS | Status: AC | PRN
  Administered 2021-06-23: 500 [IU]
  Filled 2021-06-23: qty 5

## 2021-06-23 MED ORDER — PALONOSETRON HCL INJECTION 0.25 MG/5ML
INTRAVENOUS | Status: AC
  Filled 2021-06-23: qty 5

## 2021-06-23 MED ORDER — SODIUM CHLORIDE 0.9 % IV SOLN
Freq: Once | INTRAVENOUS | Status: AC
  Filled 2021-06-23: qty 250

## 2021-06-23 NOTE — Patient Instructions (Signed)
[facility] [place]  Discharge Instructions: Thank you for choosing [facility] to provide your oncology and hematology care.   If you have a lab appointment with the [facility], please go directly to the [facility] and check in at the registration area.  Wear comfortable clothing and clothing appropriate for easy access to any Portacath or PICC line.   We strive to give you quality time with your provider. You may need to reschedule your appointment if you arrive late (15 or more minutes).  Arriving late affects you and other patients whose appointments are after yours.  Also, if you miss three or more appointments without notifying the office, you may be dismissed from the clinic at the provider's discretion.      For prescription refill requests, have your pharmacy contact our office and allow 72 hours for refills to be completed.    Today you received the following chemotherapy and/or immunotherapy agents Vidaza      To help prevent nausea and vomiting after your treatment, we encourage you to take your nausea medication as directed.  BELOW ARE SYMPTOMS THAT SHOULD BE REPORTED IMMEDIATELY: *FEVER GREATER THAN 100.4 F (38 C) OR HIGHER *CHILLS OR SWEATING *NAUSEA AND VOMITING THAT IS NOT CONTROLLED WITH YOUR NAUSEA MEDICATION *UNUSUAL SHORTNESS OF BREATH *UNUSUAL BRUISING OR BLEEDING *URINARY PROBLEMS (pain or burning when urinating, or frequent urination) *BOWEL PROBLEMS (unusual diarrhea, constipation, pain near the anus) TENDERNESS IN MOUTH AND THROAT WITH OR WITHOUT PRESENCE OF ULCERS (sore throat, sores in mouth, or a toothache) UNUSUAL RASH, SWELLING OR PAIN  UNUSUAL VAGINAL DISCHARGE OR ITCHING   Items with * indicate a potential emergency and should be followed up as soon as possible or go to the Emergency Department if any problems should occur.  Please show the CHEMOTHERAPY ALERT CARD or IMMUNOTHERAPY ALERT CARD at check-in to the  Emergency Department and triage nurse. Should you have questions after your visit or need to cancel or reschedule your appointment, please contact [facility]  [phone] and follow the prompts.  Office hours are 8:00 a.m. to 4:30 p.m. Monday - Friday. Please note that voicemails left after 4:00 p.m. may not be returned until the following business day.  We are closed weekends and major holidays. You have access to a nurse at all times for urgent questions. Please call the main number to the clinic [phone] and follow the prompts.  For any non-urgent questions, you may also contact your provider using MyChart. We now offer e-Visits for anyone 107 and older to request care online for non-urgent symptoms. For details visit [email or web address].   Also download the MyChart app! Go to the app store, search "MyChart", open the app, select [facility], and log in with your MyChart username and password.  Due to Covid, a mask is required upon entering the hospital/clinic. If you do not have a mask, one will be given to you upon arrival. For doctor visits, patients may have 1 support person aged 10 or older with them. For treatment visits, patients cannot have anyone with them due to current Covid guidelines and our immunocompromised population.

## 2021-06-24 ENCOUNTER — Inpatient Hospital Stay: Payer: PPO

## 2021-06-24 VITALS — BP 130/54 | HR 77 | Temp 98.0°F | Resp 17

## 2021-06-24 DIAGNOSIS — D649 Anemia, unspecified: Secondary | ICD-10-CM

## 2021-06-24 DIAGNOSIS — D469 Myelodysplastic syndrome, unspecified: Secondary | ICD-10-CM | POA: Diagnosis not present

## 2021-06-24 DIAGNOSIS — D46Z Other myelodysplastic syndromes: Secondary | ICD-10-CM

## 2021-06-24 LAB — SAMPLE TO BLOOD BANK

## 2021-06-24 LAB — CBC WITH DIFFERENTIAL (CANCER CENTER ONLY)
Abs Immature Granulocytes: 0.24 10*3/uL — ABNORMAL HIGH (ref 0.00–0.07)
Basophils Absolute: 0 10*3/uL (ref 0.0–0.1)
Basophils Relative: 0 %
Eosinophils Absolute: 0 10*3/uL (ref 0.0–0.5)
Eosinophils Relative: 0 %
HCT: 22.2 % — ABNORMAL LOW (ref 39.0–52.0)
Hemoglobin: 7.6 g/dL — ABNORMAL LOW (ref 13.0–17.0)
Immature Granulocytes: 13 %
Lymphocytes Relative: 18 %
Lymphs Abs: 0.3 10*3/uL — ABNORMAL LOW (ref 0.7–4.0)
MCH: 29.8 pg (ref 26.0–34.0)
MCHC: 34.2 g/dL (ref 30.0–36.0)
MCV: 87.1 fL (ref 80.0–100.0)
Monocytes Absolute: 0.1 10*3/uL (ref 0.1–1.0)
Monocytes Relative: 5 %
Neutro Abs: 1.2 10*3/uL — ABNORMAL LOW (ref 1.7–7.7)
Neutrophils Relative %: 64 %
Platelet Count: 12 10*3/uL — ABNORMAL LOW (ref 150–400)
RBC: 2.55 MIL/uL — ABNORMAL LOW (ref 4.22–5.81)
RDW: 15.5 % (ref 11.5–15.5)
WBC Count: 1.8 10*3/uL — ABNORMAL LOW (ref 4.0–10.5)
WBC Morphology: 2
nRBC: 1.1 % — ABNORMAL HIGH (ref 0.0–0.2)

## 2021-06-24 LAB — CMP (CANCER CENTER ONLY)
ALT: 14 U/L (ref 0–44)
AST: 16 U/L (ref 15–41)
Albumin: 3.9 g/dL (ref 3.5–5.0)
Alkaline Phosphatase: 42 U/L (ref 38–126)
Anion gap: 9 (ref 5–15)
BUN: 33 mg/dL — ABNORMAL HIGH (ref 8–23)
CO2: 19 mmol/L — ABNORMAL LOW (ref 22–32)
Calcium: 9.2 mg/dL (ref 8.9–10.3)
Chloride: 98 mmol/L (ref 98–111)
Creatinine: 1.44 mg/dL — ABNORMAL HIGH (ref 0.61–1.24)
GFR, Estimated: 52 mL/min — ABNORMAL LOW (ref >60)
Glucose, Bld: 197 mg/dL — ABNORMAL HIGH (ref 70–99)
Potassium: 3.7 mmol/L (ref 3.5–5.1)
Sodium: 126 mmol/L — ABNORMAL LOW (ref 135–145)
Total Bilirubin: 0.8 mg/dL (ref 0.3–1.2)
Total Protein: 6.7 g/dL (ref 6.5–8.1)

## 2021-06-24 MED ORDER — SODIUM CHLORIDE 0.9 % IV SOLN
Freq: Once | INTRAVENOUS | Status: AC
  Filled 2021-06-24: qty 250

## 2021-06-24 MED ORDER — HEPARIN SOD (PORK) LOCK FLUSH 100 UNIT/ML IV SOLN
500.0000 [IU] | Freq: Once | INTRAVENOUS | Status: AC | PRN
  Administered 2021-06-24: 500 [IU]
  Filled 2021-06-24: qty 5

## 2021-06-24 MED ORDER — SODIUM CHLORIDE 0.9 % IV SOLN
75.0000 mg/m2 | Freq: Once | INTRAVENOUS | Status: AC
  Administered 2021-06-24: 165 mg via INTRAVENOUS
  Filled 2021-06-24: qty 16.5

## 2021-06-24 MED ORDER — SODIUM CHLORIDE 0.9 % IV SOLN
10.0000 mg | Freq: Once | INTRAVENOUS | Status: AC
  Administered 2021-06-24: 10 mg via INTRAVENOUS
  Filled 2021-06-24: qty 10

## 2021-06-24 MED ORDER — SODIUM CHLORIDE 0.9% FLUSH
10.0000 mL | INTRAVENOUS | Status: DC | PRN
  Administered 2021-06-24: 10 mL
  Filled 2021-06-24: qty 10

## 2021-06-24 NOTE — Patient Instructions (Signed)
[facility] [place]  Discharge Instructions: Thank you for choosing [facility] to provide your oncology and hematology care.   If you have a lab appointment with the [facility], please go directly to the [facility] and check in at the registration area.  Wear comfortable clothing and clothing appropriate for easy access to any Portacath or PICC line.   We strive to give you quality time with your provider. You may need to reschedule your appointment if you arrive late (15 or more minutes).  Arriving late affects you and other patients whose appointments are after yours.  Also, if you miss three or more appointments without notifying the office, you may be dismissed from the clinic at the provider's discretion.      For prescription refill requests, have your pharmacy contact our office and allow 72 hours for refills to be completed.    Today you received the following chemotherapy and/or immunotherapy agents Vidaza   To help prevent nausea and vomiting after your treatment, we encourage you to take your nausea medication as directed.  BELOW ARE SYMPTOMS THAT SHOULD BE REPORTED IMMEDIATELY: *FEVER GREATER THAN 100.4 F (38 C) OR HIGHER *CHILLS OR SWEATING *NAUSEA AND VOMITING THAT IS NOT CONTROLLED WITH YOUR NAUSEA MEDICATION *UNUSUAL SHORTNESS OF BREATH *UNUSUAL BRUISING OR BLEEDING *URINARY PROBLEMS (pain or burning when urinating, or frequent urination) *BOWEL PROBLEMS (unusual diarrhea, constipation, pain near the anus) TENDERNESS IN MOUTH AND THROAT WITH OR WITHOUT PRESENCE OF ULCERS (sore throat, sores in mouth, or a toothache) UNUSUAL RASH, SWELLING OR PAIN  UNUSUAL VAGINAL DISCHARGE OR ITCHING   Items with * indicate a potential emergency and should be followed up as soon as possible or go to the Emergency Department if any problems should occur.  Please show the CHEMOTHERAPY ALERT CARD or IMMUNOTHERAPY ALERT CARD at check-in to the  Emergency Department and triage nurse. Should you have questions after your visit or need to cancel or reschedule your appointment, please contact [facility]  [phone] and follow the prompts.  Office hours are 8:00 a.m. to 4:30 p.m. Monday - Friday. Please note that voicemails left after 4:00 p.m. may not be returned until the following business day.  We are closed weekends and major holidays. You have access to a nurse at all times for urgent questions. Please call the main number to the clinic [phone] and follow the prompts.  For any non-urgent questions, you may also contact your provider using MyChart. We now offer e-Visits for anyone 10 and older to request care online for non-urgent symptoms. For details visit [email or web address].   Also download the MyChart app! Go to the app store, search "MyChart", open the app, select [facility], and log in with your MyChart username and password.  Due to Covid, a mask is required upon entering the hospital/clinic. If you do not have a mask, one will be given to you upon arrival. For doctor visits, patients may have 1 support person aged 35 or older with them. For treatment visits, patients cannot have anyone with them due to current Covid guidelines and our immunocompromised population.

## 2021-06-25 ENCOUNTER — Encounter: Payer: Self-pay | Admitting: Hematology & Oncology

## 2021-06-25 LAB — PREPARE PLATELET PHERESIS: Unit division: 0

## 2021-06-25 LAB — BPAM PLATELET PHERESIS
Blood Product Expiration Date: 202207162359
ISSUE DATE / TIME: 202207140918
Unit Type and Rh: 5100

## 2021-06-26 DIAGNOSIS — N368 Other specified disorders of urethra: Secondary | ICD-10-CM | POA: Diagnosis not present

## 2021-06-26 DIAGNOSIS — D46Z Other myelodysplastic syndromes: Secondary | ICD-10-CM | POA: Diagnosis not present

## 2021-06-26 DIAGNOSIS — I350 Nonrheumatic aortic (valve) stenosis: Secondary | ICD-10-CM | POA: Diagnosis not present

## 2021-06-26 DIAGNOSIS — N179 Acute kidney failure, unspecified: Secondary | ICD-10-CM | POA: Diagnosis not present

## 2021-06-26 DIAGNOSIS — Z7984 Long term (current) use of oral hypoglycemic drugs: Secondary | ICD-10-CM | POA: Diagnosis not present

## 2021-06-26 DIAGNOSIS — Z9861 Coronary angioplasty status: Secondary | ICD-10-CM | POA: Diagnosis not present

## 2021-06-26 DIAGNOSIS — E1122 Type 2 diabetes mellitus with diabetic chronic kidney disease: Secondary | ICD-10-CM | POA: Diagnosis not present

## 2021-06-26 DIAGNOSIS — I129 Hypertensive chronic kidney disease with stage 1 through stage 4 chronic kidney disease, or unspecified chronic kidney disease: Secondary | ICD-10-CM | POA: Diagnosis not present

## 2021-06-26 DIAGNOSIS — E1142 Type 2 diabetes mellitus with diabetic polyneuropathy: Secondary | ICD-10-CM | POA: Diagnosis not present

## 2021-06-26 DIAGNOSIS — E119 Type 2 diabetes mellitus without complications: Secondary | ICD-10-CM | POA: Diagnosis not present

## 2021-06-26 DIAGNOSIS — D61818 Other pancytopenia: Secondary | ICD-10-CM | POA: Diagnosis not present

## 2021-06-26 DIAGNOSIS — Z79899 Other long term (current) drug therapy: Secondary | ICD-10-CM | POA: Diagnosis not present

## 2021-06-26 DIAGNOSIS — K2270 Barrett's esophagus without dysplasia: Secondary | ICD-10-CM | POA: Diagnosis not present

## 2021-06-26 DIAGNOSIS — G4733 Obstructive sleep apnea (adult) (pediatric): Secondary | ICD-10-CM | POA: Diagnosis not present

## 2021-06-26 DIAGNOSIS — I10 Essential (primary) hypertension: Secondary | ICD-10-CM | POA: Diagnosis not present

## 2021-06-26 DIAGNOSIS — K219 Gastro-esophageal reflux disease without esophagitis: Secondary | ICD-10-CM | POA: Diagnosis not present

## 2021-06-26 DIAGNOSIS — N183 Chronic kidney disease, stage 3 unspecified: Secondary | ICD-10-CM | POA: Diagnosis not present

## 2021-06-26 DIAGNOSIS — N289 Disorder of kidney and ureter, unspecified: Secondary | ICD-10-CM | POA: Diagnosis not present

## 2021-06-26 DIAGNOSIS — I2510 Atherosclerotic heart disease of native coronary artery without angina pectoris: Secondary | ICD-10-CM | POA: Diagnosis not present

## 2021-06-26 DIAGNOSIS — D696 Thrombocytopenia, unspecified: Secondary | ICD-10-CM | POA: Diagnosis not present

## 2021-06-26 DIAGNOSIS — Z951 Presence of aortocoronary bypass graft: Secondary | ICD-10-CM | POA: Diagnosis not present

## 2021-06-26 DIAGNOSIS — E785 Hyperlipidemia, unspecified: Secondary | ICD-10-CM | POA: Diagnosis not present

## 2021-06-26 DIAGNOSIS — I714 Abdominal aortic aneurysm, without rupture: Secondary | ICD-10-CM | POA: Diagnosis not present

## 2021-06-26 DIAGNOSIS — E871 Hypo-osmolality and hyponatremia: Secondary | ICD-10-CM | POA: Diagnosis not present

## 2021-06-26 DIAGNOSIS — G629 Polyneuropathy, unspecified: Secondary | ICD-10-CM | POA: Diagnosis not present

## 2021-06-26 DIAGNOSIS — R040 Epistaxis: Secondary | ICD-10-CM | POA: Diagnosis not present

## 2021-06-26 DIAGNOSIS — Z87891 Personal history of nicotine dependence: Secondary | ICD-10-CM | POA: Diagnosis not present

## 2021-06-26 DIAGNOSIS — Z9889 Other specified postprocedural states: Secondary | ICD-10-CM | POA: Diagnosis not present

## 2021-06-26 DIAGNOSIS — Z953 Presence of xenogenic heart valve: Secondary | ICD-10-CM | POA: Diagnosis not present

## 2021-06-27 DIAGNOSIS — D469 Myelodysplastic syndrome, unspecified: Secondary | ICD-10-CM | POA: Diagnosis not present

## 2021-06-27 DIAGNOSIS — R319 Hematuria, unspecified: Secondary | ICD-10-CM | POA: Diagnosis not present

## 2021-06-27 DIAGNOSIS — Z7984 Long term (current) use of oral hypoglycemic drugs: Secondary | ICD-10-CM | POA: Diagnosis not present

## 2021-06-27 DIAGNOSIS — D696 Thrombocytopenia, unspecified: Secondary | ICD-10-CM | POA: Diagnosis not present

## 2021-06-27 DIAGNOSIS — D61818 Other pancytopenia: Secondary | ICD-10-CM | POA: Diagnosis not present

## 2021-06-27 DIAGNOSIS — N179 Acute kidney failure, unspecified: Secondary | ICD-10-CM | POA: Diagnosis not present

## 2021-06-27 DIAGNOSIS — R040 Epistaxis: Secondary | ICD-10-CM | POA: Diagnosis not present

## 2021-06-27 DIAGNOSIS — E785 Hyperlipidemia, unspecified: Secondary | ICD-10-CM | POA: Diagnosis not present

## 2021-06-27 DIAGNOSIS — N183 Chronic kidney disease, stage 3 unspecified: Secondary | ICD-10-CM | POA: Insufficient documentation

## 2021-06-27 DIAGNOSIS — E119 Type 2 diabetes mellitus without complications: Secondary | ICD-10-CM | POA: Diagnosis not present

## 2021-06-27 DIAGNOSIS — E871 Hypo-osmolality and hyponatremia: Secondary | ICD-10-CM | POA: Diagnosis not present

## 2021-06-27 DIAGNOSIS — N368 Other specified disorders of urethra: Secondary | ICD-10-CM | POA: Diagnosis not present

## 2021-06-28 ENCOUNTER — Encounter: Payer: Self-pay | Admitting: Hematology & Oncology

## 2021-06-28 ENCOUNTER — Inpatient Hospital Stay (HOSPITAL_COMMUNITY)
Admission: AD | Admit: 2021-06-28 | Discharge: 2021-07-03 | DRG: 809 | Disposition: A | Discharge status: Home / Self Care | Payer: PPO | Source: Ambulatory Visit | Attending: Internal Medicine | Admitting: Internal Medicine

## 2021-06-28 ENCOUNTER — Encounter: Payer: Self-pay | Admitting: Obstetrics and Gynecology

## 2021-06-28 ENCOUNTER — Encounter (HOSPITAL_COMMUNITY): Payer: Self-pay | Admitting: Internal Medicine

## 2021-06-28 DIAGNOSIS — R0602 Shortness of breath: Secondary | ICD-10-CM

## 2021-06-28 DIAGNOSIS — E669 Obesity, unspecified: Secondary | ICD-10-CM | POA: Diagnosis present

## 2021-06-28 DIAGNOSIS — N179 Acute kidney failure, unspecified: Secondary | ICD-10-CM | POA: Diagnosis present

## 2021-06-28 DIAGNOSIS — I714 Abdominal aortic aneurysm, without rupture: Secondary | ICD-10-CM | POA: Diagnosis not present

## 2021-06-28 DIAGNOSIS — C946 Myelodysplastic disease, not classified: Secondary | ICD-10-CM

## 2021-06-28 DIAGNOSIS — D469 Myelodysplastic syndrome, unspecified: Secondary | ICD-10-CM | POA: Diagnosis not present

## 2021-06-28 DIAGNOSIS — D46Z Other myelodysplastic syndromes: Secondary | ICD-10-CM

## 2021-06-28 DIAGNOSIS — Z8249 Family history of ischemic heart disease and other diseases of the circulatory system: Secondary | ICD-10-CM | POA: Diagnosis not present

## 2021-06-28 DIAGNOSIS — G4733 Obstructive sleep apnea (adult) (pediatric): Secondary | ICD-10-CM | POA: Diagnosis present

## 2021-06-28 DIAGNOSIS — R310 Gross hematuria: Secondary | ICD-10-CM | POA: Diagnosis present

## 2021-06-28 DIAGNOSIS — I491 Atrial premature depolarization: Secondary | ICD-10-CM | POA: Diagnosis not present

## 2021-06-28 DIAGNOSIS — E785 Hyperlipidemia, unspecified: Secondary | ICD-10-CM | POA: Diagnosis present

## 2021-06-28 DIAGNOSIS — E118 Type 2 diabetes mellitus with unspecified complications: Secondary | ICD-10-CM

## 2021-06-28 DIAGNOSIS — E1165 Type 2 diabetes mellitus with hyperglycemia: Secondary | ICD-10-CM | POA: Diagnosis not present

## 2021-06-28 DIAGNOSIS — R040 Epistaxis: Secondary | ICD-10-CM | POA: Diagnosis not present

## 2021-06-28 DIAGNOSIS — D61818 Other pancytopenia: Secondary | ICD-10-CM | POA: Diagnosis not present

## 2021-06-28 DIAGNOSIS — I517 Cardiomegaly: Secondary | ICD-10-CM | POA: Diagnosis not present

## 2021-06-28 DIAGNOSIS — I129 Hypertensive chronic kidney disease with stage 1 through stage 4 chronic kidney disease, or unspecified chronic kidney disease: Secondary | ICD-10-CM | POA: Diagnosis present

## 2021-06-28 DIAGNOSIS — Z953 Presence of xenogenic heart valve: Secondary | ICD-10-CM

## 2021-06-28 DIAGNOSIS — J811 Chronic pulmonary edema: Secondary | ICD-10-CM | POA: Diagnosis present

## 2021-06-28 DIAGNOSIS — I454 Nonspecific intraventricular block: Secondary | ICD-10-CM | POA: Diagnosis not present

## 2021-06-28 DIAGNOSIS — K219 Gastro-esophageal reflux disease without esophagitis: Secondary | ICD-10-CM | POA: Diagnosis present

## 2021-06-28 DIAGNOSIS — Z951 Presence of aortocoronary bypass graft: Secondary | ICD-10-CM

## 2021-06-28 DIAGNOSIS — Z7984 Long term (current) use of oral hypoglycemic drugs: Secondary | ICD-10-CM | POA: Diagnosis not present

## 2021-06-28 DIAGNOSIS — E871 Hypo-osmolality and hyponatremia: Secondary | ICD-10-CM | POA: Diagnosis present

## 2021-06-28 DIAGNOSIS — D4622 Refractory anemia with excess of blasts 2: Secondary | ICD-10-CM | POA: Diagnosis not present

## 2021-06-28 DIAGNOSIS — Z6830 Body mass index (BMI) 30.0-30.9, adult: Secondary | ICD-10-CM

## 2021-06-28 DIAGNOSIS — N182 Chronic kidney disease, stage 2 (mild): Secondary | ICD-10-CM | POA: Diagnosis not present

## 2021-06-28 DIAGNOSIS — D696 Thrombocytopenia, unspecified: Secondary | ICD-10-CM | POA: Diagnosis present

## 2021-06-28 DIAGNOSIS — K2270 Barrett's esophagus without dysplasia: Secondary | ICD-10-CM | POA: Diagnosis not present

## 2021-06-28 DIAGNOSIS — L8990 Pressure ulcer of unspecified site, unspecified stage: Secondary | ICD-10-CM | POA: Insufficient documentation

## 2021-06-28 DIAGNOSIS — D62 Acute posthemorrhagic anemia: Secondary | ICD-10-CM | POA: Diagnosis not present

## 2021-06-28 DIAGNOSIS — E1122 Type 2 diabetes mellitus with diabetic chronic kidney disease: Secondary | ICD-10-CM | POA: Diagnosis present

## 2021-06-28 DIAGNOSIS — Z885 Allergy status to narcotic agent status: Secondary | ICD-10-CM

## 2021-06-28 DIAGNOSIS — Z955 Presence of coronary angioplasty implant and graft: Secondary | ICD-10-CM

## 2021-06-28 DIAGNOSIS — Z87891 Personal history of nicotine dependence: Secondary | ICD-10-CM

## 2021-06-28 DIAGNOSIS — I2510 Atherosclerotic heart disease of native coronary artery without angina pectoris: Secondary | ICD-10-CM | POA: Diagnosis not present

## 2021-06-28 DIAGNOSIS — E119 Type 2 diabetes mellitus without complications: Secondary | ICD-10-CM | POA: Diagnosis not present

## 2021-06-28 LAB — GLUCOSE, CAPILLARY
Glucose-Capillary: 164 mg/dL — ABNORMAL HIGH (ref 70–99)
Glucose-Capillary: 201 mg/dL — ABNORMAL HIGH (ref 70–99)

## 2021-06-28 MED ORDER — ACETAMINOPHEN 325 MG PO TABS
650.0000 mg | ORAL_TABLET | Freq: Four times a day (QID) | ORAL | Status: DC | PRN
  Administered 2021-06-28 – 2021-07-02 (×6): 650 mg via ORAL
  Filled 2021-06-28 (×6): qty 2

## 2021-06-28 MED ORDER — INSULIN ASPART 100 UNIT/ML IJ SOLN
0.0000 [IU] | Freq: Every day | INTRAMUSCULAR | Status: DC
Start: 2021-06-28 — End: 2021-07-03
  Administered 2021-06-28 – 2021-07-01 (×2): 2 [IU] via SUBCUTANEOUS

## 2021-06-28 MED ORDER — ADULT MULTIVITAMIN W/MINERALS CH
1.0000 | ORAL_TABLET | Freq: Every morning | ORAL | Status: DC
  Administered 2021-06-29 – 2021-07-03 (×4): 1 via ORAL
  Filled 2021-06-28 (×3): qty 1

## 2021-06-28 MED ORDER — ONDANSETRON HCL 4 MG PO TABS: 4.0000 mg | ORAL_TABLET | Freq: Four times a day (QID) | ORAL | Status: DC | PRN

## 2021-06-28 MED ORDER — METOPROLOL SUCCINATE ER 50 MG PO TB24
50.0000 mg | ORAL_TABLET | Freq: Every day | ORAL | Status: DC
  Administered 2021-06-28 – 2021-07-01 (×4): 50 mg via ORAL
  Filled 2021-06-28 (×4): qty 1

## 2021-06-28 MED ORDER — GABAPENTIN 300 MG PO CAPS
600.0000 mg | ORAL_CAPSULE | Freq: Every day | ORAL | Status: DC
  Administered 2021-06-28 – 2021-07-02 (×5): 600 mg via ORAL
  Filled 2021-06-28 (×5): qty 2

## 2021-06-28 MED ORDER — INSULIN ASPART 100 UNIT/ML IJ SOLN
0.0000 [IU] | Freq: Three times a day (TID) | INTRAMUSCULAR | Status: DC
  Administered 2021-06-29: 2 [IU] via SUBCUTANEOUS
  Administered 2021-06-29: 5 [IU] via SUBCUTANEOUS
  Administered 2021-06-29: 2 [IU] via SUBCUTANEOUS
  Administered 2021-06-30: 1 [IU] via SUBCUTANEOUS
  Administered 2021-06-30: 2 [IU] via SUBCUTANEOUS
  Administered 2021-06-30: 3 [IU] via SUBCUTANEOUS
  Administered 2021-07-01: 2 [IU] via SUBCUTANEOUS
  Administered 2021-07-01: 3 [IU] via SUBCUTANEOUS
  Administered 2021-07-01 – 2021-07-02 (×2): 2 [IU] via SUBCUTANEOUS
  Administered 2021-07-02: 1 [IU] via SUBCUTANEOUS
  Administered 2021-07-02: 3 [IU] via SUBCUTANEOUS
  Administered 2021-07-03 (×2): 2 [IU] via SUBCUTANEOUS

## 2021-06-28 MED ORDER — SENNA 8.6 MG PO TABS
1.0000 | ORAL_TABLET | Freq: Two times a day (BID) | ORAL | Status: DC
  Administered 2021-06-28 – 2021-07-03 (×10): 8.6 mg via ORAL
  Filled 2021-06-28 (×10): qty 1

## 2021-06-28 MED ORDER — PANTOPRAZOLE SODIUM 40 MG PO TBEC
40.0000 mg | DELAYED_RELEASE_TABLET | Freq: Every day | ORAL | Status: DC
  Administered 2021-06-29 – 2021-07-03 (×5): 40 mg via ORAL
  Filled 2021-06-28 (×5): qty 1

## 2021-06-28 MED ORDER — LORAZEPAM 0.5 MG PO TABS: 0.5000 mg | ORAL_TABLET | Freq: Four times a day (QID) | ORAL | Status: DC | PRN

## 2021-06-28 MED ORDER — POLYETHYLENE GLYCOL 3350 17 G PO PACK: 17.0000 g | PACK | Freq: Every day | ORAL | Status: DC | PRN

## 2021-06-28 MED ORDER — SODIUM CHLORIDE 0.9 % IV SOLN
INTRAVENOUS | Status: DC
  Administered 2021-06-28: 1000 mL via INTRAVENOUS

## 2021-06-28 MED ORDER — ACETAMINOPHEN 650 MG RE SUPP: 650.0000 mg | Freq: Four times a day (QID) | RECTAL | Status: DC | PRN

## 2021-06-28 MED ORDER — ONDANSETRON HCL 4 MG/2ML IJ SOLN: 4.0000 mg | Freq: Four times a day (QID) | INTRAMUSCULAR | Status: DC | PRN

## 2021-06-28 NOTE — H&P (Signed)
History and Physical    Theodore Jenkins WEX:[id number] DOB: 17-Apr-1946 DOA: 06/28/2021  PCP: Theodore Einstein, MD Patient coming from: [facility]  Chief Complaint: Thrombocytopenia  HPI: 75yo w/ a hx of DM2, CAD s/p CABG, AoS s/p AVR, HTH, AAA, OSA, CKD, endocarditis 2021, and severe myelodysplastic syndrome (followed by Dr. [name]) who was admitted to [facility] on transfer from [facility] where he presented on 7/16 with severe epistaxis. He was found to have a plt count of 7. Hematology/Oncology was consulted at the OSH, and he has been transfused a totla of 5 phoresis units of plts. ENT intervened w/ a rhino rocket w/ resolved epistaxis. On 7/17 he developed gross hematuria. Urology was consulted and felt it was simply a consequence of his severe thrombocytopenia. The patient himself desired tx to [facility] to be seen by Dr. Marin Olp, who agreed. [facility] has facilitated the transfer.   Assessment/Plan  Severe thrombocytopenia with active bleeding Platelet count at time of presentation to [facility] was 7 - haptoglobin and LDH at time of presentation were normal - s/p 5 pheresis units of Plts per hx - plt count 12 this AM - no active bleeding appreciable at time of admit - follow up counts in AM - Dr. Marin Olp consulted   Acute blood loss anemia on chronic anemia of MDS Hgb 7.5 at time of presentation to [facility] - due to epistaxis, hematuria, and hx suggestive of UGIB - monitor Hgb trend - transfuse as needed to keep Hgb 8.0 or > given hx of CAD   Epistaxis Stopped - packing in place in L nare due to be removed in ENT office in [place] tomorrow - re-reval in AM and consider bedside removal if remains stable over night   Gross hematuria Appears to have ceased for now - monitor UOP - try to avoid foley if able, but pt at risk for obstruction   Severe myelodysplastic syndrome -pancytopenia Hematology consulted   Hyponatremia -chronic Sodium 122 at time of admission to [facility] - monitor trend   AKI on CKD   Baseline creatinine reportedly 1.2 -creatinine 1.7 at time of presentation to [facility] - keep hydrated and monitor trend  CAD s/p CABG  Asymtomatic presently   AoS s/p Bioprosthetic AVR   HTN Monitor BP closley w/ blood loss   DM2  Initiate SSI and follow CBG    DVT prophylaxis: SCDs Code Status: Full code Family Communication: spoke w/ wife at bedside Disposition Plan: Admit to [employer] called: Oncology  Review of Systems: As per HPI otherwise 10 point review of systems negative.   Past Medical History:  Diagnosis Date   Aortic stenosis    Echo (08/2017): Severe AS with mean gradient of 42 mmHg.   Barrett's esophagus    CAD (coronary artery disease) 06/06/2011   Remote stent to the LAD in 2005 // Last cath in 2010 showing diffuse CAD, small PD that is occluded and fills with left to right collaterals, 60-70% 1st OM, with normal EF. Managed medically // Myoview 3/18:  Normal perfusion. LVEF 55% with normal wall motion. This is a low risk study.   Complication of anesthesia    Diabetes mellitus    ED (erectile dysfunction)    Family history of adverse reaction to anesthesia 10/23/2017   sister had history of malignant hyperthermia when she was 18, she is 73 years old now   GERD (gastroesophageal reflux disease)    Goals of care, counseling/discussion 12/22/2020   Hyperlipidemia  Hypertension    IHD (ischemic heart disease)    prior stenting of the LAD in December of 2005; with last cath in 2010 showing diffuse CAD with normal LV function. He is managed medically.    Malignant hyperthermia    sister had reaction concerning for malignant hyperthermia (~ 1988)   MDS (myelodysplastic syndrome), high grade ([place]) 12/22/2020   Neuromuscular disorder ([place]) 10/23/2017   lumbar 4 disc in back causing nerve pain, on gabapentin   Obesity    Obstructive sleep apnea on CPAP    PONV (postoperative nausea and vomiting)    S/P aortic valve replacement with bioprosthetic valve  10/26/2017   25 mm [facility] Ease bovine pericardial tissue valve   S/P coronary artery stent placement 2005   LAD    Past Surgical History:  Procedure Laterality Date   AORTIC VALVE REPLACEMENT N/A 10/26/2017   Procedure: AORTIC VALVE REPLACEMENT (AVR), using Magna Ease 25;  Surgeon: Rexene Alberts, MD;  Location: [facility];  Service: Open Heart Surgery;  Laterality: N/A;   CARDIAC CATHETERIZATION  2010   NORMAL LV FUNCTION. DIFFUSE CORONARY DISEASE WITH THE DISTAL PORTION OF THE SMALL POSTERIOR DESCENDING VESSEL OCCLUDED AND FILLING WITH THE RIGHT COLLATERALS, 60-70% STENOSIS IN THE FIRST OBTUSE MARGINAL VESSEL AND DIFFUSE SMALL VESSEL DISEASE   CAROTID ENDARTERECTOMY     CIRCUMCISION  1985   CORONARY ARTERY BYPASS GRAFT N/A 10/26/2017   Procedure: CORONARY ARTERY BYPASS GRAFTING (CABG) x two , using left internal mammary artery and right leg greater saphenous vein harvested endoscopically;  Surgeon: Rexene Alberts, MD;  Location: [place];  Service: Open Heart Surgery;  Laterality: N/A;   CORONARY STENT PLACEMENT  2005   IR IMAGING GUIDED PORT INSERTION  12/28/2020   RIGHT/LEFT HEART CATH AND CORONARY ANGIOGRAPHY N/A 08/25/2017   Procedure: RIGHT/LEFT HEART CATH AND CORONARY ANGIOGRAPHY;  Surgeon: Nelva Bush, MD;  Location: [facility] CV LAB;  Service: Cardiovascular;  Laterality: N/A;   rotator cuff surgery Right    TEE WITHOUT CARDIOVERSION N/A 10/26/2017   Procedure: TRANSESOPHAGEAL ECHOCARDIOGRAM (TEE);  Surgeon: Rexene Alberts, MD;  Location: [facility];  Service: Open Heart Surgery;  Laterality: N/A;   TEE WITHOUT CARDIOVERSION N/A 05/01/2020   Procedure: TRANSESOPHAGEAL ECHOCARDIOGRAM (TEE);  Surgeon: Buford Dresser, MD;  Location: [facility] ENDOSCOPY;  Service: Cardiovascular;  Laterality: N/A;    Family History  Family History  Problem Relation Age of Onset   Cancer Mother    Heart attack Father 20   CAD Brother     Social History   reports that he quit smoking  about 28 years ago. His smoking use included cigarettes. He has a 20.00 pack-year smoking history. He has never used smokeless tobacco. He reports current alcohol use of about 1.0 standard drink of alcohol per week. He reports that he does not use drugs.  Allergies Allergies  Allergen Reactions   Oxycodone Nausea And Vomiting    Prior to Admission medications   Medication Sig Start Date End Date Taking? Authorizing Provider  acetaminophen (TYLENOL) 500 MG tablet Take 1,000 mg by mouth every 8 (eight) hours as needed for mild pain (or headaches).    [provider]  amoxicillin (AMOXIL) 500 MG capsule Take 2,000 mg by mouth See admin instructions. Take 2,000 mg by mouth one hour prior to dental appointments    [provider]  chlorhexidine (PERIDEX) 0.12 % solution Use as directed 15 mLs in the mouth or throat 2 (two) times daily. 01/25/21  Volanda Napoleon, MD  ciprofloxacin (CIPRO) 500 MG tablet Take 1 tablet (500 mg total) by mouth 2 (two) times daily. 06/08/21   Volanda Napoleon, MD  dexamethasone (DECADRON) 4 MG tablet Take 2 tablets (8 mg total) by mouth daily. Start the day after chemotherapy ends for 2 days. Take with food. 01/25/21   Volanda Napoleon, MD  fluticasone (FLONASE) 50 MCG/ACT nasal spray Place 2 sprays into both nostrils in the morning.    [provider]  gabapentin (NEURONTIN) 300 MG capsule Take 600 mg by mouth at bedtime.     [provider]  lidocaine-prilocaine (EMLA) cream Apply to affected area once 01/25/21   Volanda Napoleon, MD  lisinopril (ZESTRIL) 5 MG tablet TAKE 1 TABLET BY MOUTH DAILY 05/31/21   Burnell Blanks, MD  loratadine (CLARITIN) 10 MG tablet Take 10 mg by mouth daily.    [provider]  LORazepam (ATIVAN) 0.5 MG tablet Take 1 tablet (0.5 mg total) by mouth every 6 (six) hours as needed (Nausea or vomiting). Patient not taking: No sig reported 01/25/21   Volanda Napoleon, MD  metoprolol succinate  (TOPROL-XL) 50 MG 24 hr tablet Take 50 mg by mouth at bedtime.    [provider]  Multiple Vitamins-Minerals (ONE-A-DAY 50 PLUS PO) Take 1 tablet by mouth daily with breakfast.    [provider]  nitroGLYCERIN (NITROSTAT) 0.4 MG SL tablet Place 1 tablet (0.4 mg total) under the tongue every 5 (five) minutes as needed for chest pain. 04/19/19 04/29/20  Burnell Blanks, MD  omeprazole (PRILOSEC) 20 MG capsule Take 20 mg by mouth daily.    [provider]  ondansetron (ZOFRAN) 8 MG tablet Take 1 tablet (8 mg total) by mouth 2 (two) times daily as needed for refractory nausea / vomiting. Start on the third day after last day of chemotherapy. Patient not taking: No sig reported 01/25/21   Volanda Napoleon, MD  [facility] VERIO test strip 1 each by Other route daily. 12/29/17   [provider]  polyethylene glycol (MIRALAX / GLYCOLAX) 17 g packet Take 17 g by mouth daily. Patient not taking: No sig reported 05/04/20   Donne Hazel, MD  prochlorperazine (COMPAZINE) 10 MG tablet Take 1 tablet (10 mg total) by mouth every 6 (six) hours as needed (Nausea or vomiting). Patient not taking: No sig reported 01/25/21   Volanda Napoleon, MD  rosuvastatin (CRESTOR) 10 MG tablet Take 10 mg by mouth at bedtime.    [provider]  sitaGLIPtin-metformin (JANUMET) 50-1000 MG tablet Take 1 tablet by mouth 2 (two) times daily with a meal.  12/19/16   [provider]  venetoclax (VENCLEXTA) 100 MG tablet Take 4 tablets (400 mg total) by mouth daily. Take with a meal and a full glass of water. 06/09/21   Volanda Napoleon, MD    Physical Exam: There were no vitals filed for this visit.  Constitutional: NAD, calm, comfortable ENMT: Mucous membranes are moist. Posterior pharynx clear of any exudate or lesions.Nasal packing in L nare w/ dressing soaked in dry blood w/o clear active bleeding or other d/c.  Neck: normal, supple, no masses, no thyromegaly Respiratory:  clear to auscultation bilaterally, no wheezing, no crackles. Normal respiratory effort. No accessory muscle use.  Cardiovascular: Regular rate and rhythm - mild systolic click. No extremity edema. 2+ pedal pulses. No carotid bruits.  Abdomen: no tenderness, no masses palpated. No hepatosplenomegaly. Bowel sounds positive.  Musculoskeletal: no  clubbing / cyanosis. No joint deformity upper and lower extremities. Good ROM, no contractures. Normal muscle tone.  Skin: no rashes, lesions, ulcers. No induration Neurologic: CN 2-12 grossly intact. Sensation intact, Strength 5/5 in all 4.  Psychiatric: Normal judgment and insight. Alert and oriented x 3. Normal mood.    Labs on Admission:   CBC: Recent Labs  Lab 06/24/21 0820  WBC 1.8*  NEUTROABS 1.2*  HGB 7.6*  HCT 22.2*  MCV 87.1  PLT 12*   Basic Metabolic Panel: Recent Labs  Lab 06/24/21 0820  NA 126*  K 3.7  CL 98  CO2 19*  GLUCOSE 197*  BUN 33*  CREATININE 1.44*  CALCIUM 9.2    Liver Function Tests: Recent Labs  Lab 06/24/21 0820  AST 16  ALT 14  ALKPHOS 42  BILITOT 0.8  PROT 6.7  ALBUMIN 3.9    BNP (last 3 results) Recent Labs    12/09/20 1629  PROBNP 415*    Urine analysis:    Component Value Date/Time   COLORURINE STRAW (A) 12/10/2020 1743   APPEARANCEUR CLEAR 12/10/2020 1743   LABSPEC 1.005 12/10/2020 1743   PHURINE 6.0 12/10/2020 1743   GLUCOSEU NEGATIVE 12/10/2020 1743   [place] 12/10/2020 1743   [place] 12/10/2020 [place] 12/10/2020 1743   PROTEINUR NEGATIVE 12/10/2020 1743   NITRITE NEGATIVE 12/10/2020 1743   LEUKOCYTESUR NEGATIVE 12/10/2020 1743    Cherene Altes, MD [facility] Office  [phone] Pager - Text Page per Shea Evans as per below:  On-Call/Text Page:      [email or web address]  If 7PM-7AM, please contact night-coverage [email or web address] 06/28/2021, 5:03 PM

## 2021-06-29 ENCOUNTER — Encounter (HOSPITAL_COMMUNITY): Payer: Self-pay | Admitting: Internal Medicine

## 2021-06-29 DIAGNOSIS — D61818 Other pancytopenia: Secondary | ICD-10-CM | POA: Diagnosis not present

## 2021-06-29 DIAGNOSIS — R040 Epistaxis: Secondary | ICD-10-CM | POA: Diagnosis not present

## 2021-06-29 DIAGNOSIS — E119 Type 2 diabetes mellitus without complications: Secondary | ICD-10-CM

## 2021-06-29 DIAGNOSIS — E871 Hypo-osmolality and hyponatremia: Secondary | ICD-10-CM | POA: Diagnosis not present

## 2021-06-29 DIAGNOSIS — D696 Thrombocytopenia, unspecified: Secondary | ICD-10-CM

## 2021-06-29 DIAGNOSIS — D46Z Other myelodysplastic syndromes: Secondary | ICD-10-CM | POA: Diagnosis not present

## 2021-06-29 DIAGNOSIS — R0602 Shortness of breath: Secondary | ICD-10-CM

## 2021-06-29 DIAGNOSIS — L8990 Pressure ulcer of unspecified site, unspecified stage: Secondary | ICD-10-CM | POA: Insufficient documentation

## 2021-06-29 LAB — GLUCOSE, CAPILLARY
Glucose-Capillary: 171 mg/dL — ABNORMAL HIGH (ref 70–99)
Glucose-Capillary: 195 mg/dL — ABNORMAL HIGH (ref 70–99)
Glucose-Capillary: 259 mg/dL — ABNORMAL HIGH (ref 70–99)
Glucose-Capillary: 165 mg/dL — ABNORMAL HIGH (ref 70–99)

## 2021-06-29 LAB — COMPREHENSIVE METABOLIC PANEL
ALT: 20 U/L (ref 0–44)
AST: 22 U/L (ref 15–41)
Albumin: 3.2 g/dL — ABNORMAL LOW (ref 3.5–5.0)
Alkaline Phosphatase: 42 U/L (ref 38–126)
Anion gap: 7 (ref 5–15)
BUN: 18 mg/dL (ref 8–23)
CO2: 21 mmol/L — ABNORMAL LOW (ref 22–32)
Calcium: 8.1 mg/dL — ABNORMAL LOW (ref 8.9–10.3)
Chloride: 101 mmol/L (ref 98–111)
Creatinine, Ser: 0.9 mg/dL (ref 0.61–1.24)
GFR, Estimated: >60 mL/min (ref >60)
Glucose, Bld: 166 mg/dL — ABNORMAL HIGH (ref 70–99)
Potassium: 3.7 mmol/L (ref 3.5–5.1)
Sodium: 129 mmol/L — ABNORMAL LOW (ref 135–145)
Total Bilirubin: 0.8 mg/dL (ref 0.3–1.2)
Total Protein: 6.6 g/dL (ref 6.5–8.1)

## 2021-06-29 LAB — CBC WITH DIFFERENTIAL/PLATELET
Abs Immature Granulocytes: 0.15 10*3/uL — ABNORMAL HIGH (ref 0.00–0.07)
Basophils Absolute: 0 10*3/uL (ref 0.0–0.1)
Basophils Relative: 0 %
Eosinophils Absolute: 0 10*3/uL (ref 0.0–0.5)
Eosinophils Relative: 0 %
HCT: 19.5 % — ABNORMAL LOW (ref 39.0–52.0)
Hemoglobin: 6.7 g/dL — CL (ref 13.0–17.0)
Immature Granulocytes: 10 %
Lymphocytes Relative: 14 %
Lymphs Abs: 0.2 10*3/uL — ABNORMAL LOW (ref 0.7–4.0)
MCH: 28.8 pg (ref 26.0–34.0)
MCHC: 34.4 g/dL (ref 30.0–36.0)
MCV: 83.7 fL (ref 80.0–100.0)
Monocytes Absolute: 0.1 10*3/uL (ref 0.1–1.0)
Monocytes Relative: 3 %
Neutro Abs: 1.1 10*3/uL — ABNORMAL LOW (ref 1.7–7.7)
Neutrophils Relative %: 73 %
Platelets: 11 10*3/uL — CL (ref 150–400)
RBC: 2.33 MIL/uL — ABNORMAL LOW (ref 4.22–5.81)
RDW: 16.2 % — ABNORMAL HIGH (ref 11.5–15.5)
WBC: 1.5 10*3/uL — ABNORMAL LOW (ref 4.0–10.5)
nRBC: 1.4 % — ABNORMAL HIGH (ref 0.0–0.2)

## 2021-06-29 LAB — HEMOGLOBIN A1C
Hgb A1c MFr Bld: 6.5 % — ABNORMAL HIGH (ref 4.8–5.6)
Mean Plasma Glucose: 139.85 mg/dL

## 2021-06-29 LAB — PROTIME-INR
INR: 1.3 — ABNORMAL HIGH (ref 0.8–1.2)
Prothrombin Time: 16.2 seconds — ABNORMAL HIGH (ref 11.4–15.2)

## 2021-06-29 MED ORDER — CIPROFLOXACIN HCL 500 MG PO TABS: 500.0000 mg | ORAL_TABLET | Freq: Two times a day (BID) | ORAL | Status: DC

## 2021-06-29 MED ORDER — CIPROFLOXACIN HCL 500 MG PO TABS
500.0000 mg | ORAL_TABLET | Freq: Two times a day (BID) | ORAL | Status: DC
  Administered 2021-06-29 – 2021-07-03 (×9): 500 mg via ORAL
  Filled 2021-06-29 (×9): qty 1

## 2021-06-29 MED ORDER — FLUCONAZOLE 100 MG PO TABS
100.0000 mg | ORAL_TABLET | Freq: Every day | ORAL | Status: DC
  Administered 2021-06-29 – 2021-07-03 (×5): 100 mg via ORAL
  Filled 2021-06-29 (×5): qty 1

## 2021-06-29 MED ORDER — FAMCICLOVIR 500 MG PO TABS
500.0000 mg | ORAL_TABLET | Freq: Every day | ORAL | Status: DC
  Administered 2021-06-29 – 2021-07-03 (×5): 500 mg via ORAL
  Filled 2021-06-29 (×5): qty 1

## 2021-06-29 MED ORDER — CHLORHEXIDINE GLUCONATE CLOTH 2 % EX PADS
6.0000 | MEDICATED_PAD | Freq: Every day | CUTANEOUS | Status: DC
  Administered 2021-06-29 – 2021-07-03 (×5): 6 via TOPICAL

## 2021-06-29 MED ORDER — AMINOCAPROIC ACID 500 MG PO TABS
1.0000 g | ORAL_TABLET | Freq: Four times a day (QID) | ORAL | Status: DC
  Administered 2021-06-29 – 2021-07-03 (×17): 1 g via ORAL
  Filled 2021-06-29 (×20): qty 2

## 2021-06-29 MED ORDER — ENSURE MAX PROTEIN PO LIQD
11.0000 [oz_av] | Freq: Two times a day (BID) | ORAL | Status: DC
  Administered 2021-06-29 – 2021-07-03 (×8): 11 [oz_av] via ORAL
  Filled 2021-06-29 (×9): qty 330

## 2021-06-29 MED ORDER — SALINE SPRAY 0.65 % NA SOLN
1.0000 | Freq: Three times a day (TID) | NASAL | Status: DC
  Administered 2021-06-30 – 2021-07-02 (×8): 1 via NASAL
  Filled 2021-06-29 (×2): qty 44

## 2021-06-29 MED ORDER — AMINOCAPROIC ACID 500 MG PO TABS
2.0000 g | ORAL_TABLET | ORAL | Status: AC
  Administered 2021-06-29: 2 g via ORAL
  Filled 2021-06-29: qty 4

## 2021-06-29 NOTE — Progress Notes (Signed)
Theodore Jenkins  YWV:[id number] DOB: August 31, 1946 DOA: 06/28/2021 PCP: Karlene Einstein, MD    Brief Narrative:  75yo w/ a hx of DM2, CAD s/p CABG, AoS s/p AVR, HTH, AAA, OSA, CKD, endocarditis 2021, and severe myelodysplastic syndrome (followed by Dr. [name]) who was admitted to [facility] on transfer from [facility] where he presented on 7/16 with severe epistaxis. He was found to have a plt count of 7. Hematology/Oncology was consulted at the OSH, and he has been transfused a total of 5 phoresis units of plts. ENT intervened w/ a rhino rocket w/ resolved epistaxis. On 7/17 he developed gross hematuria. Urology was consulted and felt it was simply a consequence of his severe thrombocytopenia. The patient himself desired tx to [facility] to be seen by Dr. Marin Olp, who agreed. [facility] has facilitated the transfer.    Significant Events:  7/16 admitted to [facility] 7/17 L nare packed by ENT (08:47AM) 7/17 gross hematuria - Urology consulted  7/17 transfer to [facility] at patient request  Consultants:  Hematology  ENT  Code Status: FULL CODE  Antimicrobials:  Cipro 7/19 >  DVT prophylaxis: SCDs  Subjective: The patient is resting comfortably in bed.  He has no new complaints.  He reports difficulty breathing because of the pressure in his left nare related to the packing.  He denies chest pain nausea or vomiting.  He has not noted any more hematuria.  There is been no hemoptysis or hematemesis.  No abdominal pain.  Assessment & Plan:  Severe thrombocytopenia with active bleeding Platelet count at time of presentation to [facility] was 7 - haptoglobin and LDH at time of presentation were normal - s/p 5 pheresis units of Plts per hx - no active bleeding appreciable at time of admit - attempting to hold on further transfusions unless plt count <10 - Amicar initiated by Hematology    Acute blood loss anemia on chronic anemia of MDS Hgb 7.5 at time of presentation to [facility] - due to epistaxis,  hematuria, and hx suggestive of UGIB - monitor Hgb trend - transfuse PRBC at discretion of Hematology   Recent Labs  Lab 06/24/21 0820 06/29/21 0510  HGB 7.6* 6.7*      Epistaxis Stopped - L nasal packing placed by ENT at [facility] on 7/17 - spoke to on-call ENT MD today - she advised the packing should remain in place for 5 days in this situation - the plan is to utilize saline spray to keep the exterior portion of the packing moist - on Friday (day 5) the balloon will need to be deflated and the device allowed 12mns or so to deflate and detach on it's on as much as it will - the device can then be removed, with preparations in place to address bleeding via other means should it recur immediately after the device is removed - we agreed to re-consult ENT after 5 days when ready to remove the packing (7/22)   Gross hematuria Appears to have ceased - monitor UOP - try to avoid foley if able, but pt at risk for obstruction    Severe myelodysplastic syndrome - pancytopenia Hematology directing care - to undergo repeat bone marrow biopsy   Hyponatremia -chronic Sodium 122 at time of admission to [facility] -trending upward with volume support -monitor trend   AKI on CKD  Baseline creatinine reportedly 1.2 -creat[facility]  Recent Labs  Lab 06/24/21 0820 06/29/21  0510  CREATININE 1.44* 0.90    CAD s/p CABG Asymtomatic presently   AoS s/p Bioprosthetic AVR   HTN Monitor BP closley w/ blood loss   DM2 CBG variable at this time - follow w/o change in tx today     Family Communication: Spoke with wife at bedside Status is: Inpatient  Remains inpatient appropriate because:Inpatient level of care appropriate due to severity of illness  Dispo: The patient is from: Home              Anticipated d/c is to: Home              Patient currently is not medically stable to d/c.   Difficult to place patient No   Objective: Blood  pressure 119/65, pulse (!) 107, temperature 99.4 F (37.4 C), temperature source Oral, resp. rate 17, height 5' 9"  (1.753 m), weight 93.5 kg, SpO2 100 %.  Intake/Output Summary (Last 24 hours) at 06/29/2021 0957 Last data filed at 06/29/2021 0600 Gross per 24 hour  Intake 786.3 ml  Output 600 ml  Net 186.3 ml   Filed Weights   06/28/21 1800  Weight: 93.5 kg    Examination: General: No acute respiratory distress HEENT: Rhino Rocket type device in place in left nare with clear dried blood soaking in the device as well as nostril but no evidence of active bleeding and no purulent discharge or odor Lungs: Clear to auscultation bilaterally without wheezes or crackles Cardiovascular: Regular rate and rhythm without murmur gallop or rub normal S1 and S2 Abdomen: Nontender, nondistended, soft, bowel sounds positive, no rebound, no ascites, no appreciable mass Extremities: No significant cyanosis, clubbing, or edema bilateral lower extremities  CBC: Recent Labs  Lab 06/24/21 0820 06/29/21 0510  WBC 1.8* 1.5*  NEUTROABS 1.2* 1.1*  HGB 7.6* 6.7*  HCT 22.2* 19.5*  MCV 87.1 83.7  PLT 12* 11*   Basic Metabolic Panel: Recent Labs  Lab 06/24/21 0820 06/29/21 0510  NA 126* 129*  K 3.7 3.7  CL 98 101  CO2 19* 21*  GLUCOSE 197* 166*  BUN 33* 18  CREATININE 1.44* 0.90  CALCIUM 9.2 8.1*   GFR: Estimated Creatinine Clearance: 83.7 mL/min (by C-G formula based on SCr of 0.9 mg/dL).  Liver Function Tests: Recent Labs  Lab 06/24/21 0820 06/29/21 0510  AST 16 22  ALT 14 20  ALKPHOS 42 42  BILITOT 0.8 0.8  PROT 6.7 6.6  ALBUMIN 3.9 3.2*     Coagulation Profile: Recent Labs  Lab 06/29/21 0510  INR 1.3*     HbA1C: Hgb A1c MFr Bld  Date/Time Value Ref Range Status  06/29/2021 05:10 AM 6.5 (H) 4.8 - 5.6 % Final    Comment:    (NOTE) Pre diabetes:          5.7%-6.4%  Diabetes:              >6.4%  Glycemic control for   <7.0% adults with diabetes   12/11/2020 04:44  AM 5.6 4.8 - 5.6 % Final    Comment:    (NOTE) Pre diabetes:          5.7%-6.4%  Diabetes:              >6.4%  Glycemic control for   <7.0% adults with diabetes     CBG: Recent Labs  Lab 06/28/21 1858 06/28/21 2122 06/29/21 0750  GLUCAP 164* 201* 171*    Scheduled Meds:  aminocaproic acid  1 g Oral Q6H  Chlorhexidine Gluconate Cloth  6 each Topical Daily   ciprofloxacin  500 mg Oral BID   famciclovir  500 mg Oral Daily   fluconazole  100 mg Oral Daily   gabapentin  600 mg Oral QHS   insulin aspart  0-5 Units Subcutaneous QHS   insulin aspart  0-9 Units Subcutaneous TID WC   metoprolol succinate  50 mg Oral QHS   multivitamin with minerals  1 tablet Oral q morning   pantoprazole  40 mg Oral Daily   senna  1 tablet Oral BID   Continuous Infusions:  sodium chloride 1,000 mL (06/28/21 1848)     LOS: 1 day   Cherene Altes, MD [facility] Office  [phone] Pager - Text Page per Amion  If 7PM-7AM, please contact night-coverage per Amion 06/29/2021, 9:57 AM

## 2021-06-29 NOTE — Consult Note (Signed)
Referral MD  Reason for Referral: MDS -- RAEB-2  No chief complaint on file. : I had nosebleeds.  HPI: Theodore Jenkins is well-known to me.  He is a 75 year old white male.  He is diagnosed back in December 2021 with high-grade myelodysplasia.  He is on Vidaza along with venetoclax.  His last bone marrow test was done back in May which really did not show any change in the myelodysplasia.  He subsequently started to have a nosebleed on 16 July.  He ultimately went to [facility].  He was admitted.  He also had some hematuria.  He had a nasal tampon placed into the left nares.  He is been getting platelets.  Because he is had a scare with the [facility] system, he felt that be best for him to be moved over to [facility].  He subsequently was transferred yesterday.  He did have a temperature of 101.6 recently.  He had been on some prophylactic antibiotics.  He has not had any falling.  He has had quite a bit of bruising.  His labs today show white count 1.5.  Hemoglobin 6.7.  Platelet count 11,000.  His blood sugars 166.  His BUN is 18 creatinine 0.9.  He still has the nasal "tampon" in place.  I am sure we will try to get ENT to help [place] out.  I will also start him on some Amicar to see this might help with his platelet function.  He has had no diarrhea.  He says the hematuria has stopped.  He has had no mouth sores.  The venetoclax was stopped over at [place] going to have to get another bone marrow biopsy on him so we can see how the myelodysplasia is responding.  He is not eating all that much.  His appetite is down a little bit.  Little over a year ago, he had problems with endocarditis.  He was on antibiotics for quite a while.  Currently, I would say his performance status is ECOG 2.   Past Medical History:  Diagnosis Date   Aortic stenosis    Echo (08/2017): Severe AS with mean gradient of 42 mmHg.   Barrett's esophagus    CAD (coronary artery  disease) 06/06/2011   Remote stent to the LAD in 2005 // Last cath in 2010 showing diffuse CAD, small PD that is occluded and fills with left to right collaterals, 60-70% 1st OM, with normal EF. Managed medically // Myoview 3/18:  Normal perfusion. LVEF 55% with normal wall motion. This is a low risk study.   Complication of anesthesia    Diabetes mellitus    ED (erectile dysfunction)    Family history of adverse reaction to anesthesia 10/23/2017   sister had history of malignant hyperthermia when she was 32, she is 73 years old now   GERD (gastroesophageal reflux disease)    Goals of care, counseling/discussion 12/22/2020   Hyperlipidemia    Hypertension    IHD (ischemic heart disease)    prior stenting of the LAD in December of 2005; with last cath in 2010 showing diffuse CAD with normal LV function. He is managed medically.    Malignant hyperthermia    sister had reaction concerning for malignant hyperthermia (~ 1988)   MDS (myelodysplastic syndrome), high grade ([place]) 12/22/2020   Neuromuscular disorder ([place]) 10/23/2017   lumbar 4 disc in back causing nerve pain, on gabapentin   Obesity    Obstructive sleep apnea on  CPAP    PONV (postoperative nausea and vomiting)    S/P aortic valve replacement with bioprosthetic valve 10/26/2017   25 mm [facility] Ease bovine pericardial tissue valve   S/P coronary artery stent placement 2005   LAD  :   Past Surgical History:  Procedure Laterality Date   AORTIC VALVE REPLACEMENT N/A 10/26/2017   Procedure: AORTIC VALVE REPLACEMENT (AVR), using Magna Ease 25;  Surgeon: Rexene Alberts, MD;  Location: [place];  Service: Open Heart Surgery;  Laterality: N/A;   CARDIAC CATHETERIZATION  2010   NORMAL LV FUNCTION. DIFFUSE CORONARY DISEASE WITH THE DISTAL PORTION OF THE SMALL POSTERIOR DESCENDING VESSEL OCCLUDED AND FILLING WITH THE RIGHT COLLATERALS, 60-70% STENOSIS IN THE FIRST OBTUSE MARGINAL VESSEL AND DIFFUSE SMALL VESSEL DISEASE   CAROTID  ENDARTERECTOMY     CIRCUMCISION  1985   CORONARY ARTERY BYPASS GRAFT N/A 10/26/2017   Procedure: CORONARY ARTERY BYPASS GRAFTING (CABG) x two , using left internal mammary artery and right leg greater saphenous vein harvested endoscopically;  Surgeon: Rexene Alberts, MD;  Location: [place];  Service: Open Heart Surgery;  Laterality: N/A;   CORONARY STENT PLACEMENT  2005   IR IMAGING GUIDED PORT INSERTION  12/28/2020   RIGHT/LEFT HEART CATH AND CORONARY ANGIOGRAPHY N/A 08/25/2017   Procedure: RIGHT/LEFT HEART CATH AND CORONARY ANGIOGRAPHY;  Surgeon: Nelva Bush, MD;  Location: [place] [facility];  Service: Cardiovascular;  Laterality: N/A;   rotator cuff surgery Right    TEE WITHOUT CARDIOVERSION N/A 10/26/2017   Procedure: TRANSESOPHAGEAL ECHOCARDIOGRAM (TEE);  Surgeon: Rexene Alberts, MD;  Location: [place];  Service: Open Heart Surgery;  Laterality: N/A;   TEE WITHOUT CARDIOVERSION N/A 05/01/2020   Procedure: TRANSESOPHAGEAL ECHOCARDIOGRAM (TEE);  Surgeon: Buford Dresser, MD;  Location: [place];  Service: Cardiovascular;  Laterality: N/A;  :   Current Facility-Administered Medications:    0.9 %  sodium chloride infusion, , Intravenous, Continuous, Cherene Altes, MD, Last Rate: 50 mL/hr at 06/28/21 1848, 1,000 mL at 06/28/21 1848   acetaminophen (TYLENOL) tablet 650 mg, 650 mg, Oral, Q6H PRN, 650 mg at 06/28/21 1932 **OR** [DISCONTINUED] acetaminophen (TYLENOL) suppository 650 mg, 650 mg, Rectal, Q6H PRN, Cherene Altes, MD   Chlorhexidine Gluconate Cloth 2 % PADS 6 each, 6 each, Topical, Daily, McClung, Kimberlee Nearing, MD   gabapentin (NEURONTIN) capsule 600 mg, 600 mg, Oral, QHS, Joette Catching T, MD, 600 mg at 06/28/21 2225   insulin aspart (novoLOG) injection 0-5 Units, 0-5 Units, Subcutaneous, QHS, Cherene Altes, MD, 2 Units at 06/28/21 2226   insulin aspart (novoLOG) injection 0-9 Units, 0-9 Units, Subcutaneous, TID WC, McClung, Kimberlee Nearing, MD   LORazepam  (ATIVAN) tablet 0.5 mg, 0.5 mg, Oral, Q6H PRN, Cherene Altes, MD   metoprolol succinate (TOPROL-XL) 24 hr tablet 50 mg, 50 mg, Oral, QHS, Cherene Altes, MD, 50 mg at 06/28/21 2225   multivitamin with minerals tablet 1 tablet, 1 tablet, Oral, q morning, McClung, Kimberlee Nearing, MD   ondansetron [facility]) tablet 4 mg, 4 mg, Oral, Q6H PRN **OR** ondansetron (ZOFRAN) injection 4 mg, 4 mg, Intravenous, Q6H PRN, Cherene Altes, MD   pantoprazole (PROTONIX) EC tablet 40 mg, 40 mg, Oral, Daily, McClung, Kimberlee Nearing, MD   polyethylene glycol (MIRALAX / GLYCOLAX) packet 17 g, 17 g, Oral, Daily PRN, Cherene Altes, MD   senna (SENOKOT) tablet 8.6 mg, 1 tablet, Oral, BID, Cherene Altes, MD, 8.6 mg at 06/28/21 2225  Facility-Administered Medications Ordered  in Other Encounters:    sodium chloride flush (NS) 0.9 % injection 10 mL, 10 mL, Intracatheter, PRN, Volanda Napoleon, MD, 10 mL at 04/14/21 1614:   Chlorhexidine Gluconate Cloth  6 each Topical Daily   gabapentin  600 mg Oral QHS   insulin aspart  0-5 Units Subcutaneous QHS   insulin aspart  0-9 Units Subcutaneous TID WC   metoprolol succinate  50 mg Oral QHS   multivitamin with minerals  1 tablet Oral q morning   pantoprazole  40 mg Oral Daily   senna  1 tablet Oral BID  :   Allergies  Allergen Reactions   Oxycodone Nausea And Vomiting  :   Family History  Problem Relation Age of Onset   Cancer Mother    Heart attack Father 19   CAD Brother   :   Social History   Socioeconomic History   Marital status: Married    Spouse name: Not on file   Number of children: 3   Years of education: Not on file   Highest education level: Not on file  Occupational History   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  Tobacco Use   Smoking status: Former    Packs/day: 1.00    Years: 20.00    Pack years: 20.00    Types: Cigarettes    Quit date: 12/12/1992    Years since quitting: 28.5   Smokeless tobacco: Never  Vaping Use    Vaping Use: Never used  Substance and Sexual Activity   Alcohol use: Yes    Alcohol/week: 1.0 standard drink    Types: 1 Cans of beer per week   Drug use: No   Sexual activity: Not Currently  Other Topics Concern   Not on file  Social History Narrative   Not on file   Social Determinants of Health   Financial Resource Strain: Not on file  Food Insecurity: Not on file  Transportation Needs: Not on file  Physical Activity: Not on file  Stress: Not on file  Social Connections: Not on file  Intimate Partner Violence: Not on file  :  Review of Systems  Constitutional:  Positive for chills and fever.  HENT:  Positive for nosebleeds.   Eyes: Negative.   Respiratory:  Positive for shortness of breath.   Cardiovascular:  Positive for palpitations.  Gastrointestinal: Negative.   Genitourinary:  Positive for hematuria.  Musculoskeletal: Negative.   Skin: Negative.   Neurological: Negative.   Endo/Heme/Allergies:  Bruises/bleeds easily.  Psychiatric/Behavioral: Negative.      Exam:  This is a well-developed well-nourished white male in no obvious distress.  Vital signs are temperature 99.4.  Pulse 107.  Blood pressure 119/65.  Oxygen saturation is 100%.  Head neck exam shows no oral lesions.  There is no mucositis.  No scleral icterus.  Lungs are clear bilaterally.  Cardiac exam exam irregular rate and rhythm.  He has no murmurs.  Abdomen is soft.  He has good bowel sounds.  There is no fluid wave.  There is no obvious hepatosplenomegaly.  Extremities show some mild nonpitting edema in his legs.  Skin exam shows ecchymosis scattered in upper and lower extremities.  Neurological exam is nonfocal.   Patient Vitals for the past 24 hrs:  BP Temp Temp src Pulse Resp SpO2 Height Weight  06/29/21 0459 119/65 99.4 F (37.4 C) Oral (!) 107 16 100 % -- --  06/29/21 0135 124/69 98.3 F (36.8 C) Oral 87 14 99 % -- --  06/28/21 2120 134/78 98.4 F (36.9 C) Oral 97 16 99 % -- --  06/28/21  1800 -- -- -- -- -- -- 5' 9"  (1.753 m) 206 lb 2.1 oz (93.5 kg)  06/28/21 1719 130/65 98.8 F (37.1 C) Oral 98 18 99 % -- --     No results for input(s): WBC, HGB, HCT, PLT in the last 72 hours.  Recent Labs    06/29/21 0510  NA 129*  K 3.7  CL 101  CO2 21*  GLUCOSE 166*  BUN 18  CREATININE 0.90  CALCIUM 8.1*    Blood smear review: None  Pathology: None    Assessment and Plan: Mr. Shirk is a very nice 75 year old white male.  He has high-grade myelodysplasia.  Surprisingly, he does not have any cytogenetic abnormalities.  He does have some he may changes on his MDS panel.  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  This could certainly put him at a high risk.  Again he really has had resilient myelodysplasia.  He has been on therapy for about 8 months and he is yet to have a response.  I know back in early January, we had a delay in treatment because of COVID.  I think it be worthwhile to do another bone marrow biopsy on him to see exactly what is going on.  1 problem is that he was not a candidate for high aggressive therapy.  I will start him on Amicar to try to help with his platelet function.  He probably is going need to be on some kind of prophylactic antibiotics.  His hemoglobin is down a little bit.  We will have to follow this.  If his hemoglobin drops further, he will need to be transfused.  I am trying to hold off on platelet transfusions unless his platelet count is below 10,000.  I do appreciate the Hospitalist helping [place] out.  I know he was a crazy day yesterday trying to get him over from [facility].  I do appreciate everybody's help.  We are going to have to get ENT to help [place] out with this nasal packing.  We will follow along and help out in any way that we can.  Theodore Haw, MD  Theodore Jenkins

## 2021-06-29 NOTE — H&P (Signed)
Chief Complaint: Patient was seen in consultation today for image guided bone marrow biopsy and aspiration at the request of Dr. Marin Olp  Referring Physician(s): Dr. Marin Olp  Supervising Physician: Ruthann Cancer  Patient Status: [facility] - In-pt  History of Present Illness: Theodore Jenkins is a 75 y.o. male with past medical history aortic stenosis, CAD, HTN, IHD s/p stent placement in 2005, aortic valve replacement in 2019, Barrett's esophagus, GERD, DM, HLD, OSA on CPAP, family history of malignant hyperthermia, and high-grade myelodysplastic syndrome diagnosed on January 2022 and he has been on chemotherapy for about 8 months.  Patient is known to our service for previous bone marrow biopsy and aspiration on 04/27/2021. Patient presented to [facility] on 06/26/2021 due to epistasis, patient also developed hematuria and was evaluated by urology, who thought that hematuria was a consequence of his severe thrombocytopenia.  Patient wished to be transferred to [facility] so that he can be evaluated by Dr. Marin Olp, patient was transferred to [facility] on 06/28/2021. Patient was evaluated by Dr. Marin Olp on 06/29/2021, a repeat bone marrow was recommended to the patient to further evaluate his  response to current treatment.  After thorough discussion and shared decision making, patient decided to proceed with the bone marrow biopsy.  IR was requested for image guided bone marrow biopsy and aspiration. Case was reviewed and approved by Dr. Serafina Royals.  Upon evaluation, patient sitting in bed, not in acute distress. Wife at the bedside.  Still has nasal packing in left nares, reports mild headache today.  Theodore Jenkins headache, fever, chills, shortness of breath, cough, chest pain, abdominal pain, nausea ,vomiting, and bleeding.   Past Medical History:  Diagnosis Date   Aortic stenosis    Echo (08/2017): Severe AS with mean gradient of 42 mmHg.   Barrett's esophagus    CAD (coronary artery disease) 06/06/2011   Remote  stent to the LAD in 2005 // Last cath in 2010 showing diffuse CAD, small PD that is occluded and fills with left to right collaterals, 60-70% 1st OM, with normal EF. Managed medically // Myoview 3/18:  Normal perfusion. LVEF 55% with normal wall motion. This is a low risk study.   Complication of anesthesia    Diabetes mellitus    ED (erectile dysfunction)    Family history of adverse reaction to anesthesia 10/23/2017   sister had history of malignant hyperthermia when she was 65, she is 73 years old now   GERD (gastroesophageal reflux disease)    Goals of care, counseling/discussion 12/22/2020   Hyperlipidemia    Hypertension    IHD (ischemic heart disease)    prior stenting of the LAD in December of 2005; with last cath in 2010 showing diffuse CAD with normal LV function. He is managed medically.    Malignant hyperthermia    sister had reaction concerning for malignant hyperthermia (~ 1988)   MDS (myelodysplastic syndrome), high grade ([place]) 12/22/2020   Neuromuscular disorder ([place]) 10/23/2017   lumbar 4 disc in back causing nerve pain, on gabapentin   Obesity    Obstructive sleep apnea on CPAP    PONV (postoperative nausea and vomiting)    S/P aortic valve replacement with bioprosthetic valve 10/26/2017   25 mm [facility] Ease bovine pericardial tissue valve   S/P coronary artery stent placement 2005   LAD    Past Surgical History:  Procedure Laterality Date   AORTIC VALVE REPLACEMENT N/A 10/26/2017   Procedure: AORTIC VALVE REPLACEMENT (AVR), using Magna Ease 25;  Surgeon: Roxy Manns,  Valentina Gu, MD;  Location: [facility];  Service: Open Heart Surgery;  Laterality: N/A;   CARDIAC CATHETERIZATION  2010   NORMAL LV FUNCTION. DIFFUSE CORONARY DISEASE WITH THE DISTAL PORTION OF THE SMALL POSTERIOR DESCENDING VESSEL OCCLUDED AND FILLING WITH THE RIGHT COLLATERALS, 60-70% STENOSIS IN THE FIRST OBTUSE MARGINAL VESSEL AND DIFFUSE SMALL VESSEL DISEASE   CAROTID ENDARTERECTOMY     CIRCUMCISION  1985    CORONARY ARTERY BYPASS GRAFT N/A 10/26/2017   Procedure: CORONARY ARTERY BYPASS GRAFTING (CABG) x two , using left internal mammary artery and right leg greater saphenous vein harvested endoscopically;  Surgeon: Rexene Alberts, MD;  Location: [place];  Service: Open Heart Surgery;  Laterality: N/A;   CORONARY STENT PLACEMENT  2005   IR IMAGING GUIDED PORT INSERTION  12/28/2020   RIGHT/LEFT HEART CATH AND CORONARY ANGIOGRAPHY N/A 08/25/2017   Procedure: RIGHT/LEFT HEART CATH AND CORONARY ANGIOGRAPHY;  Surgeon: Nelva Bush, MD;  Location: [place] [facility];  Service: Cardiovascular;  Laterality: N/A;   rotator cuff surgery Right    TEE WITHOUT CARDIOVERSION N/A 10/26/2017   Procedure: TRANSESOPHAGEAL ECHOCARDIOGRAM (TEE);  Surgeon: Rexene Alberts, MD;  Location: [place];  Service: Open Heart Surgery;  Laterality: N/A;   TEE WITHOUT CARDIOVERSION N/A 05/01/2020   Procedure: TRANSESOPHAGEAL ECHOCARDIOGRAM (TEE);  Surgeon: Buford Dresser, MD;  Location: [facility] ENDOSCOPY;  Service: Cardiovascular;  Laterality: N/A;    Allergies: Oxycodone  Medications: Prior to Admission medications   Medication Sig Start Date End Date Taking? Authorizing Provider  acetaminophen (TYLENOL) 500 MG tablet Take 1,000 mg by mouth every 8 (eight) hours as needed for mild pain (or headaches).   Yes [provider]  amoxicillin (AMOXIL) 500 MG capsule Take 2,000 mg by mouth See admin instructions. Take 2,000 mg by mouth one hour prior to dental appointments   Yes [provider]  chlorhexidine (PERIDEX) 0.12 % solution Use as directed 15 mLs in the mouth or throat 2 (two) times daily. Patient taking differently: Use as directed 15 mLs in the mouth or throat at bedtime. Swish and spit 01/25/21  Yes Ennever, Rudell Cobb, MD  ciprofloxacin (CIPRO) 500 MG tablet Take 1 tablet (500 mg total) by mouth 2 (two) times daily. 06/08/21  Yes Volanda Napoleon, MD  fluticasone (FLONASE) 50 MCG/ACT nasal spray Place  2 sprays into both nostrils in the morning.   Yes [provider]  gabapentin (NEURONTIN) 300 MG capsule Take 600 mg by mouth at bedtime.    Yes [provider]  lidocaine-prilocaine (EMLA) cream Apply to affected area once Patient taking differently: Apply 1 application topically once as needed (prior to port access). 01/25/21  Yes Ennever, Rudell Cobb, MD  lisinopril (ZESTRIL) 5 MG tablet TAKE 1 TABLET BY MOUTH DAILY Patient taking differently: Take 5 mg by mouth every morning. 05/31/21  Yes Burnell Blanks, MD  loratadine (CLARITIN) 10 MG tablet Take 10 mg by mouth every morning.   Yes [provider]  LORazepam (ATIVAN) 0.5 MG tablet Take 1 tablet (0.5 mg total) by mouth every 6 (six) hours as needed (Nausea or vomiting). 01/25/21  Yes Volanda Napoleon, MD  metoprolol succinate (TOPROL-XL) 50 MG 24 hr tablet Take 50 mg by mouth at bedtime.   Yes [provider]  Multiple Vitamin (MULTIVITAMIN WITH MINERALS) TABS tablet Take 1 tablet by mouth every morning.   Yes [provider]  nitroGLYCERIN (NITROSTAT) 0.4 MG SL tablet Place 1 tablet (0.4 mg total) under the tongue  every 5 (five) minutes as needed for chest pain. 04/19/19 06/28/21 Yes Burnell Blanks, MD  omeprazole (PRILOSEC) 20 MG capsule Take 20 mg by mouth daily at 6 PM.   Yes [provider]  ondansetron (ZOFRAN) 8 MG tablet Take 1 tablet (8 mg total) by mouth 2 (two) times daily as needed for refractory nausea / vomiting. Start on the third day after last day of chemotherapy. Patient taking differently: Take 8 mg by mouth See admin instructions. Take one tablet (8 mg) by mouth one hour before chemotherapy 01/25/21  Yes Ennever, Rudell Cobb, MD  polyethylene glycol (MIRALAX / GLYCOLAX) 17 g packet Take 17 g by mouth daily. Patient taking differently: Take 17 g by mouth daily as needed (constipation). 05/04/20  Yes Donne Hazel, MD  prochlorperazine (COMPAZINE) 10 MG tablet Take 1  tablet (10 mg total) by mouth every 6 (six) hours as needed (Nausea or vomiting). 01/25/21  Yes Volanda Napoleon, MD  rosuvastatin (CRESTOR) 10 MG tablet Take 10 mg by mouth at bedtime.   Yes [provider]  sitaGLIPtin-metformin (JANUMET) 50-1000 MG tablet Take 1 tablet by mouth 2 (two) times daily with a meal.  12/19/16  Yes [provider]  [facility] VERIO test strip 1 each by Other route daily. 12/29/17   [provider]  venetoclax (VENCLEXTA) 100 MG tablet Take 4 tablets (400 mg total) by mouth daily. Take with a meal and a full glass of water. Patient taking differently: Take 200 mg by mouth every evening. Take with a meal and a full glass of water. 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, forensic  Tobacco Use   Smoking status: Former    Packs/day: 1.00    Years: 20.00    Pack years: 20.00    Types: Cigarettes    Quit date: 12/12/1992    Years since quitting: 28.5   Smokeless tobacco: Never  Vaping Use   Vaping Use: Never used  Substance and Sexual Activity   Alcohol use: Yes    Alcohol/week: 1.0 standard drink    Types: 1 Cans of beer per week   Drug use: No   Sexual activity: Not Currently  Other Topics Concern   Not on file  Social History Narrative   Not on file   Social Determinants of Health   Financial Resource Strain: Not on file  Food Insecurity: Not on file  Transportation Needs: Not on file  Physical Activity: Not on file  Stress: Not on file  Social Connections: Not on file     Review of Systems: A 12 point ROS discussed and pertinent positives are indicated in the HPI  above.  All other systems are negative.   Vital Signs: BP 125/62 (BP Location: Left Arm)   Pulse 98   Temp 99.1 F (37.3 C) (Oral)   Resp 16   Ht 5' 9"  (1.753 m)   Wt 206 lb 2.1 oz (93.5 kg)   SpO2 100%   BMI 30.44 kg/m   Physical Exam Vitals  reviewed.  Constitutional:      General: He is not in acute distress.    Appearance: Normal appearance. He is not ill-appearing.  HENT:     Head: Normocephalic and atraumatic.     Nose:     Comments: Nasal packing on left nares     Mouth/Throat:     Mouth: Mucous membranes are moist.  Cardiovascular:     Rate and Rhythm: Normal rate and regular rhythm.     Pulses: Normal pulses.     Heart sounds: Normal heart sounds.  Pulmonary:     Effort: Pulmonary effort is normal.     Breath sounds: Normal breath sounds.  Abdominal:     General: Abdomen is flat. Bowel sounds are normal.     Palpations: Abdomen is soft.  Skin:    General: Skin is warm and dry.     Coloration: Skin is not jaundiced or pale.  Neurological:     Mental Status: He is alert and oriented to person, place, and time.  Psychiatric:        Mood and Affect: Mood normal.        Behavior: Behavior normal.        Judgment: Judgment normal.    MD Evaluation Airway: WNL Heart: WNL Abdomen: WNL Chest/ Lungs: WNL ASA  Classification: 3 Mallampati/Airway Score: Two  Imaging: No results found.  Labs:  CBC: Recent Labs    06/15/21 0810 06/21/21 0920 06/24/21 0820 06/29/21 0510  WBC 1.0* 0.8* 1.8* 1.5*  HGB 7.0* 5.8* 7.6* 6.7*  HCT 20.6* 17.5* 22.2* 19.5*  PLT 27* 17* 12* 11*    COAGS: Recent Labs    12/10/20 1743 02/19/21 0929 06/29/21 0510  INR 1.1 1.1 1.3*  APTT  --  34  --     BMP: Recent Labs    12/09/20 1629 12/10/20 1305 06/15/21 0810 06/21/21 0920 06/24/21 0820 06/29/21 0510  NA 131*   < > 127* 123* 126* 129*  K 4.3   < > 4.2 4.1 3.7 3.7  CL 97   < > 97* 94* 98 101  CO2 20   < > 19* 21* 19* 21*  GLUCOSE 89   < > 158* 167* 197*  166*  BUN 24   < > 31* 27* 33* 18  CALCIUM 9.3   < > 9.2 9.1 9.2 8.1*  CREATININE 1.44*   < > 1.91* 1.68* 1.44* 0.90  GFRNONAA 48*   < > 37* 43* 52* >60  GFRAA 56*  --   --   --   --   --    < > = values in this interval not displayed.    LIVER FUNCTION TESTS: Recent Labs    04/14/21 1138 06/08/21 0810 06/24/21 0820 06/29/21 0510  BILITOT 0.5 1.0 0.8 0.8  AST 19 20 16 22   ALT 15 12 14 20   ALKPHOS 47 56 42 42  PROT 7.0 7.3 6.7 6.6  ALBUMIN 4.0 4.2 3.9 3.2*    TUMOR MARKERS: No results for input(s): AFPTM, CEA, CA199, CHROMGRNA in the last 8760 hours.  Assessment and Plan: 75 y.o. male with history of high-grade myelodysplasia, has been followed by Dr. Marin Olp and been on treatment for 17-monthwith no significant clinical response. A repeat bone marrow biopsy was recommended to the patient to further evaluate his response to the current therapy regimen.  After thorough discussion and shared decision making, patient decided to proceed with the bone marrow biopsy.  IR was requested for  image guided bone marrow biopsy and aspiration. Case was reviewed and approved by Dr. Serafina Royals.  The procedure is tentatively scheduled for Thursday, 07/01/2021 pending CT and IR schedule.  Made n.p.o. at midnight on 07/01/2021 Ordered CBC with differential to be obtained the morning of the procedure. Not on anticoagulant/antiplatelet treatment.  Risks and benefits of bone marrow biopsy and aspiration was discussed with the patient and/or patient's family including, but not limited to bleeding, infection, damage to adjacent structures or low yield requiring additional tests.  All of the questions were answered and there is agreement to proceed.  Consent signed and in chart.   Thank you for this interesting consult.  I greatly enjoyed meeting NORMAND DAMRON and look forward to participating in their care.  A copy of this report was sent to the requesting provider on this date.  Electronically  Signed: Tera Mater, PA-C 06/29/2021, 2:44 PM   I spent a total of 40 Minutes   in face to face in clinical consultation, greater than 50% of which was counseling/coordinating care for image guided bone marrow biopsy and aspiration.

## 2021-06-29 NOTE — Progress Notes (Signed)
Initial Nutrition Assessment  DOCUMENTATION CODES:   Obesity unspecified  INTERVENTION:   -Ensure MAX Protein po BID, each supplement provides 150 kcal and 30 grams of protein   NUTRITION DIAGNOSIS:   Increased nutrient needs related to chronic illness, cancer and cancer related treatments as evidenced by estimated needs.  GOAL:   Patient will meet greater than or equal to 90% of their needs  MONITOR:   PO intake, Supplement acceptance, Labs, Weight trends, I & O's, Skin  REASON FOR ASSESSMENT:   Malnutrition Screening Tool    ASSESSMENT:   75yo w/ a hx of DM2, CAD s/p CABG, AoS s/p AVR, HTH, AAA, OSA, CKD, endocarditis 2021, and severe myelodysplastic syndrome (followed by Dr. [name]) who was admitted to [facility] on transfer from [facility] where he presented on 7/16 with severe epistaxis.  Patient in room with wife at bedside. Pt reports he has a good appetite. States he drinks Premier Protein at home as well.  Currently consuming 80-100% of meals.  Will order Ensure Max for additional protein.  Per weight records, pt has lost 16 lbs since 5/31 (7% wt loss x 1.5 months, significant for time frame).  Medications: Multivitamin with minerals daily, Senokot  Labs reviewed:  CBGs: 164-201 Low Na  NUTRITION - FOCUSED PHYSICAL EXAM:  Flowsheet Row Most Recent Value  Orbital Region No depletion  Upper Arm Region Mild depletion  Thoracic and Lumbar Region Unable to assess  Buccal Region No depletion  Temple Region No depletion  Clavicle Bone Region No depletion  Clavicle and Acromion Bone Region No depletion  Scapular Bone Region No depletion  Dorsal Hand No depletion  Patellar Region Unable to assess  Anterior Thigh Region Unable to assess  Posterior Calf Region Unable to assess  Edema (RD Assessment) None       Diet Order:   Diet Order             Diet Carb Modified Fluid consistency: Thin; Room service appropriate? Yes  Diet effective now                    EDUCATION NEEDS:   Education needs have been addressed  Skin:  Skin Assessment: Skin Integrity Issues: Skin Integrity Issues:: Stage I Stage I: coccyx  Last BM:  7/18  Height:   Ht Readings from Last 1 Encounters:  06/28/21 5\' 9"  (1.753 m)    Weight:   Wt Readings from Last 1 Encounters:  06/28/21 93.5 kg    BMI:  Body mass index is 30.44 kg/m.  Estimated Nutritional Needs:   Kcal:  2000-2200  Protein:  105-120g  Fluid:  2.2L/day   Clayton Bibles, MS, RD, LDN Inpatient Clinical Dietitian Contact information available via Amion

## 2021-06-30 DIAGNOSIS — D61818 Other pancytopenia: Secondary | ICD-10-CM | POA: Diagnosis not present

## 2021-06-30 DIAGNOSIS — R040 Epistaxis: Secondary | ICD-10-CM | POA: Diagnosis not present

## 2021-06-30 DIAGNOSIS — E871 Hypo-osmolality and hyponatremia: Secondary | ICD-10-CM | POA: Diagnosis not present

## 2021-06-30 DIAGNOSIS — D46Z Other myelodysplastic syndromes: Secondary | ICD-10-CM | POA: Diagnosis not present

## 2021-06-30 LAB — CBC WITH DIFFERENTIAL/PLATELET
Abs Immature Granulocytes: 0.13 10*3/uL — ABNORMAL HIGH (ref 0.00–0.07)
Abs Immature Granulocytes: 0.19 10*3/uL — ABNORMAL HIGH (ref 0.00–0.07)
Basophils Absolute: 0 10*3/uL (ref 0.0–0.1)
Basophils Absolute: 0 10*3/uL (ref 0.0–0.1)
Basophils Relative: 0 %
Basophils Relative: 0 %
Eosinophils Absolute: 0 10*3/uL (ref 0.0–0.5)
Eosinophils Absolute: 0 10*3/uL (ref 0.0–0.5)
Eosinophils Relative: 0 %
Eosinophils Relative: 0 %
HCT: 18.9 % — ABNORMAL LOW (ref 39.0–52.0)
HCT: 19.1 % — ABNORMAL LOW (ref 39.0–52.0)
Hemoglobin: 6.4 g/dL — CL (ref 13.0–17.0)
Hemoglobin: 6.7 g/dL — CL (ref 13.0–17.0)
Immature Granulocytes: 10 %
Immature Granulocytes: 11 %
Lymphocytes Relative: 14 %
Lymphocytes Relative: 10 %
Lymphs Abs: 0.2 10*3/uL — ABNORMAL LOW (ref 0.7–4.0)
Lymphs Abs: 0.2 10*3/uL — ABNORMAL LOW (ref 0.7–4.0)
MCH: 28.4 pg (ref 26.0–34.0)
MCH: 29 pg (ref 26.0–34.0)
MCHC: 33.9 g/dL (ref 30.0–36.0)
MCHC: 35.1 g/dL (ref 30.0–36.0)
MCV: 84 fL (ref 80.0–100.0)
MCV: 82.7 fL (ref 80.0–100.0)
Monocytes Absolute: 0 10*3/uL — ABNORMAL LOW (ref 0.1–1.0)
Monocytes Absolute: 0 10*3/uL — ABNORMAL LOW (ref 0.1–1.0)
Monocytes Relative: 2 %
Monocytes Relative: 2 %
Neutro Abs: 1 10*3/uL — ABNORMAL LOW (ref 1.7–7.7)
Neutro Abs: 1.3 10*3/uL — ABNORMAL LOW (ref 1.7–7.7)
Neutrophils Relative %: 74 %
Neutrophils Relative %: 77 %
Platelets: 6 10*3/uL — CL (ref 150–400)
Platelets: 11 10*3/uL — CL (ref 150–400)
RBC: 2.25 MIL/uL — ABNORMAL LOW (ref 4.22–5.81)
RBC: 2.31 MIL/uL — ABNORMAL LOW (ref 4.22–5.81)
RDW: 15.9 % — ABNORMAL HIGH (ref 11.5–15.5)
RDW: 15.3 % (ref 11.5–15.5)
WBC: 1.3 10*3/uL — CL (ref 4.0–10.5)
WBC: 1.7 10*3/uL — ABNORMAL LOW (ref 4.0–10.5)
nRBC: 1.5 % — ABNORMAL HIGH (ref 0.0–0.2)
nRBC: 2.4 % — ABNORMAL HIGH (ref 0.0–0.2)

## 2021-06-30 LAB — GLUCOSE, CAPILLARY
Glucose-Capillary: 144 mg/dL — ABNORMAL HIGH (ref 70–99)
Glucose-Capillary: 199 mg/dL — ABNORMAL HIGH (ref 70–99)
Glucose-Capillary: 220 mg/dL — ABNORMAL HIGH (ref 70–99)
Glucose-Capillary: 196 mg/dL — ABNORMAL HIGH (ref 70–99)

## 2021-06-30 LAB — COMPREHENSIVE METABOLIC PANEL
ALT: 34 U/L (ref 0–44)
AST: 31 U/L (ref 15–41)
Albumin: 2.9 g/dL — ABNORMAL LOW (ref 3.5–5.0)
Alkaline Phosphatase: 44 U/L (ref 38–126)
Anion gap: 7 (ref 5–15)
BUN: 19 mg/dL (ref 8–23)
CO2: 21 mmol/L — ABNORMAL LOW (ref 22–32)
Calcium: 7.8 mg/dL — ABNORMAL LOW (ref 8.9–10.3)
Chloride: 98 mmol/L (ref 98–111)
Creatinine, Ser: 1.02 mg/dL (ref 0.61–1.24)
GFR, Estimated: >60 mL/min (ref >60)
Glucose, Bld: 148 mg/dL — ABNORMAL HIGH (ref 70–99)
Potassium: 3.6 mmol/L (ref 3.5–5.1)
Sodium: 126 mmol/L — ABNORMAL LOW (ref 135–145)
Total Bilirubin: 1 mg/dL (ref 0.3–1.2)
Total Protein: 6.1 g/dL — ABNORMAL LOW (ref 6.5–8.1)

## 2021-06-30 LAB — PREPARE RBC (CROSSMATCH)

## 2021-06-30 MED ORDER — SODIUM CHLORIDE 0.9% IV SOLUTION: Freq: Once | INTRAVENOUS | Status: AC

## 2021-06-30 MED ORDER — DIPHENHYDRAMINE HCL 50 MG/ML IJ SOLN
12.5000 mg | Freq: Four times a day (QID) | INTRAMUSCULAR | Status: DC | PRN
  Administered 2021-06-30 – 2021-07-02 (×2): 12.5 mg via INTRAVENOUS
  Filled 2021-06-30 (×2): qty 1

## 2021-06-30 MED ORDER — MELATONIN 3 MG PO TABS
3.0000 mg | ORAL_TABLET | Freq: Once | ORAL | Status: AC
  Administered 2021-06-30: 3 mg via ORAL
  Filled 2021-06-30: qty 1

## 2021-06-30 NOTE — Progress Notes (Signed)
Theodore Jenkins  DYJ:[id number] DOB: 1946/10/16 DOA: 06/28/2021 PCP: Karlene Einstein, MD    Brief Narrative:  75yo w/ a hx of DM2, CAD s/p CABG, AoS s/p AVR, HTH, AAA, OSA, CKD, endocarditis 2021, and severe myelodysplastic syndrome (followed by Dr. [name]) who was admitted to [facility] on transfer from [facility] where he presented on 7/16 with severe epistaxis. He was found to have a plt count of 7. Hematology/Oncology was consulted at the OSH, and he has been transfused a total of 5 phoresis units of plts. ENT intervened w/ a rhino rocket w/ resolved epistaxis. On 7/17 he developed gross hematuria. Urology was consulted and felt it was simply a consequence of his severe thrombocytopenia. The patient himself desired tx to [facility] to be seen by Dr. Marin Olp, who agreed. [facility] has facilitated the transfer.    Significant Events:  7/16 admitted to [facility] 7/17 L nare packed by ENT (08:47AM) 7/17 gross hematuria - Urology consulted  7/17 transfer to [facility] at patient request  Consultants:  Hematology  ENT  Code Status: FULL CODE  Antimicrobials:  Cipro 7/19 >  DVT prophylaxis: SCDs  Subjective: No new complaints today.  Has not appreciated any evident blood loss over last 24 hours.  Is becoming bored.  Continues to be bothered by packing in left nose.  Denies shortness of breath chest pain or abdominal pain.  Assessment & Plan:  Severe thrombocytopenia with active bleeding Platelet count at time of presentation to [facility] was 7 - haptoglobin and LDH at time of presentation were normal - s/p 5 pheresis units of Plts at [facility] per hx - no active bleeding appreciable at time of admit - Amicar initiated by Hematology - Plt transfusion ordered by Heme for today   Recent Labs  Lab 06/24/21 0820 06/29/21 0510 06/30/21 0526  PLT 12* 11* 6*     Acute blood loss anemia on chronic anemia of MDS Hgb 7.5 at time of presentation to [facility] - due to epistaxis, hematuria, MDS, and hx  suggestive of UGIB - monitor Hgb trend - transfuse PRBC at discretion of Hematology, w/ orders written for transfusion today    Recent Labs  Lab 06/24/21 0820 06/29/21 0510 06/30/21 0526  HGB 7.6* 6.7* 6.4*    Epistaxis Bleeding stopped prior to transfer from [facility] - L nasal packing placed by ENT at [facility] on 7/17 - spoke to on-call ENT MD 7/19 - she advised the packing should remain in place for 5 days in this situation - the plan is to utilize saline spray to keep the exterior portion of the packing moist - on Friday 07/02/21 (day 5) the balloon will need to be deflated and the device allowed 85mns or so to deflate and detach on it's on as much as it will - the device can then be removed, with preparations in place to address bleeding via other means should it recur immediately after the device is removed - [facility] is to re-consult ENT 07/02/21 when ready to remove the packing    Gross hematuria Appears to have ceased - monitor UOP - try to avoid foley if able, but pt at risk for obstruction    Severe myelodysplastic syndrome - pancytopenia Hematology directing care - to undergo repeat bone marrow biopsy 7/21 in IR    Hyponatremia -chronic Sodium 122 [facility]- has been trending upward overall - cont to follow    AKI on CKD  Baseline creatinine reportedly  1.2 -creatinine 1.7 at time of presentation to [facility] -renal function has normalized  Recent Labs  Lab 06/24/21 0820 06/29/21 0510 06/30/21 0526  CREATININE 1.44* 0.90 1.02    CAD s/p CABG Asymtomatic presently   AoS s/p Bioprosthetic AVR   HTN Monitor BP closley w/ blood loss - BP stable for now    DM2 CBG reasonably controlled at present     Family Communication: Spoke with wife at bedside Status is: Inpatient  Remains inpatient appropriate because:Inpatient level of care appropriate due to severity of illness  Dispo: The patient is from: Home              Anticipated d/c is to: Home              Patient  currently is not medically stable to d/c.   Difficult to place patient No   Objective: Blood pressure 129/63, pulse 96, temperature 98.6 F (37 C), temperature source Oral, resp. rate 18, height 5' 9"  (1.753 m), weight 93.5 kg, SpO2 98 %.  Intake/Output Summary (Last 24 hours) at 06/30/2021 1730 Last data filed at 06/30/2021 1530 Gross per 24 hour  Intake 2106.33 ml  Output 750 ml  Net 1356.33 ml   Filed Weights   06/28/21 1800  Weight: 93.5 kg    Examination: General: No acute respiratory distress HEENT: Rhino Rocket type device in place in left nare with dried blood covering the device - no evidence of active bleeding - no purulent discharge or odor Lungs: Clear to auscultation bilaterally  Cardiovascular: RRR - no rub  Abdomen: NT/ND, soft, bs+, no mass  Extremities: No significant edema bilateral lower extremities  CBC: Recent Labs  Lab 06/24/21 0820 06/29/21 0510 06/30/21 0526  WBC 1.8* 1.5* 1.3*  NEUTROABS 1.2* 1.1* 1.0*  HGB 7.6* 6.7* 6.4*  HCT 22.2* 19.5* 18.9*  MCV 87.1 83.7 84.0  PLT 12* 11* 6*   Basic Metabolic Panel: Recent Labs  Lab 06/24/21 0820 06/29/21 0510 06/30/21 0526  NA 126* 129* 126*  K 3.7 3.7 3.6  CL 98 101 98  CO2 19* 21* 21*  GLUCOSE 197* 166* 148*  BUN 33* 18 19  CREATININE 1.44* 0.90 1.02  CALCIUM 9.2 8.1* 7.8*   GFR: Estimated Creatinine Clearance: 73.9 mL/min (by C-G formula based on SCr of 1.02 mg/dL).  Liver Function Tests: Recent Labs  Lab 06/24/21 0820 06/29/21 0510 06/30/21 0526  AST 16 22 31   ALT 14 20 34  ALKPHOS 42 42 44  BILITOT 0.8 0.8 1.0  PROT 6.7 6.6 6.1*  ALBUMIN 3.9 3.2* 2.9*     Coagulation Profile: Recent Labs  Lab 06/29/21 0510  INR 1.3*     HbA1C: Hgb A1c MFr Bld  Date/Time Value Ref Range Status  06/29/2021 05:10 AM 6.5 (H) 4.8 - 5.6 % Final    Comment:    (NOTE) Pre diabetes:          5.7%-6.4%  Diabetes:              >6.4%  Glycemic control for   <7.0% adults with diabetes    12/11/2020 04:44 AM 5.6 4.8 - 5.6 % Final    Comment:    (NOTE) Pre diabetes:          5.7%-6.4%  Diabetes:              >6.4%  Glycemic control for   <7.0% adults with diabetes     CBG: Recent Labs  Lab 06/29/21 1721 06/29/21 2108  06/30/21 0743 06/30/21 1136 06/30/21 1701  GLUCAP 259* 165* 144* 199* 220*    Scheduled Meds:  aminocaproic acid  1 g Oral Q6H   Chlorhexidine Gluconate Cloth  6 each Topical Daily   ciprofloxacin  500 mg Oral BID   famciclovir  500 mg Oral Daily   fluconazole  100 mg Oral Daily   gabapentin  600 mg Oral QHS   insulin aspart  0-5 Units Subcutaneous QHS   insulin aspart  0-9 Units Subcutaneous TID WC   metoprolol succinate  50 mg Oral QHS   multivitamin with minerals  1 tablet Oral q morning   pantoprazole  40 mg Oral Daily   Ensure Max Protein  11 oz Oral BID   senna  1 tablet Oral BID   sodium chloride  1 spray Each Nare TID   Continuous Infusions:  sodium chloride 50 mL/hr at 06/29/21 1508     LOS: 2 days   Cherene Altes, MD [facility] Office  [phone] Pager - Text Page per Shea Evans  If 7PM-7AM, please contact night-coverage per Amion 06/30/2021, 5:30 PM

## 2021-06-30 NOTE — Progress Notes (Signed)
Overall, Theodore Jenkins is doing okay.  He still has a nasal packing in the left nares.  He says this did not really come out until Friday.  It sounds like he may have a bone marrow biopsy done tomorrow.  His labs show white cell count 1.3.  Hemoglobin 6.4.  Blood count 6000.  He will get platelets and blood today.  He is on Amicar now.  There is no bleeding that he can tell.  He has not had a bowel movement.  His appetite is down a little bit.  There is no nausea.  He has had no cough or shortness of breath.  He has had no fever.  We have on triple antibiotic coverage because of the neutropenia.  His vital signs show temperature 99.  Pulse 98.  Blood pressure 123/66.  His lungs are clear bilaterally.  Cardiac exam regular rate.  He has no murmurs.  Abdomen is soft.  Bowel sounds are present.  Extremity shows some slight edema in the legs bilaterally.  Skin exam does show scattered ecchymoses.  Neurological exam is nonfocal.  I suspect this pancytopenia is going to be a while.  Again he had treatment last week.  We will try to keep his platelet count above 10,000.  The Amicar should help with respect to bleeding.  Lattie Haw, MD  1 Corinthians 15:58

## 2021-06-30 NOTE — Progress Notes (Signed)
Theodore Jenkins does not have a current SARS coronavirus test result on file. When assessing him it is apparent that the swab would be difficult if not impossible to perform accurately due to the rhino rocket stuffing in his left nare and a very congested right nare. Seeing as he was admitted for severe epistaxis and his platelets are still in the teens, it seems that any nasal swab could trigger more bleeding. Also, upon discussing possibly doing a nasal swab for covid with Theodore Jenkins he did not feel he could tolerate it.

## 2021-07-01 ENCOUNTER — Inpatient Hospital Stay (HOSPITAL_COMMUNITY): Payer: PPO

## 2021-07-01 ENCOUNTER — Other Ambulatory Visit: Payer: PPO

## 2021-07-01 ENCOUNTER — Inpatient Hospital Stay: Payer: PPO

## 2021-07-01 ENCOUNTER — Inpatient Hospital Stay: Payer: PPO | Admitting: Hematology & Oncology

## 2021-07-01 ENCOUNTER — Other Ambulatory Visit: Payer: Self-pay

## 2021-07-01 DIAGNOSIS — D46Z Other myelodysplastic syndromes: Secondary | ICD-10-CM | POA: Diagnosis not present

## 2021-07-01 DIAGNOSIS — R040 Epistaxis: Secondary | ICD-10-CM | POA: Diagnosis not present

## 2021-07-01 DIAGNOSIS — D61818 Other pancytopenia: Secondary | ICD-10-CM | POA: Diagnosis not present

## 2021-07-01 DIAGNOSIS — E871 Hypo-osmolality and hyponatremia: Secondary | ICD-10-CM | POA: Diagnosis not present

## 2021-07-01 LAB — PREPARE PLATELET PHERESIS: Unit division: 0

## 2021-07-01 LAB — BPAM PLATELET PHERESIS
Blood Product Expiration Date: 202207222359
ISSUE DATE / TIME: 202207201354
Unit Type and Rh: 6200

## 2021-07-01 LAB — CBC WITH DIFFERENTIAL/PLATELET
Abs Immature Granulocytes: 0.04 10*3/uL (ref 0.00–0.07)
Basophils Absolute: 0 10*3/uL (ref 0.0–0.1)
Basophils Relative: 0 %
Eosinophils Absolute: 0 10*3/uL (ref 0.0–0.5)
Eosinophils Relative: 0 %
HCT: 20.3 % — ABNORMAL LOW (ref 39.0–52.0)
Hemoglobin: 6.9 g/dL — CL (ref 13.0–17.0)
Immature Granulocytes: 2 %
Lymphocytes Relative: 11 %
Lymphs Abs: 0.2 10*3/uL — ABNORMAL LOW (ref 0.7–4.0)
MCH: 28.5 pg (ref 26.0–34.0)
MCHC: 34 g/dL (ref 30.0–36.0)
MCV: 83.9 fL (ref 80.0–100.0)
Monocytes Absolute: 0 10*3/uL — ABNORMAL LOW (ref 0.1–1.0)
Monocytes Relative: 2 %
Neutro Abs: 1.5 10*3/uL — ABNORMAL LOW (ref 1.7–7.7)
Neutrophils Relative %: 85 %
Platelets: 9 10*3/uL — CL (ref 150–400)
RBC: 2.42 MIL/uL — ABNORMAL LOW (ref 4.22–5.81)
RDW: 15.3 % (ref 11.5–15.5)
WBC: 1.7 10*3/uL — ABNORMAL LOW (ref 4.0–10.5)
nRBC: 2.3 % — ABNORMAL HIGH (ref 0.0–0.2)

## 2021-07-01 LAB — BPAM RBC
Blood Product Expiration Date: 202208092359
ISSUE DATE / TIME: 202207201004
Unit Type and Rh: 6200

## 2021-07-01 LAB — COMPREHENSIVE METABOLIC PANEL
ALT: 41 U/L (ref 0–44)
AST: 32 U/L (ref 15–41)
Albumin: 3.2 g/dL — ABNORMAL LOW (ref 3.5–5.0)
Alkaline Phosphatase: 49 U/L (ref 38–126)
Anion gap: 11 (ref 5–15)
BUN: 19 mg/dL (ref 8–23)
CO2: 22 mmol/L (ref 22–32)
Calcium: 8.4 mg/dL — ABNORMAL LOW (ref 8.9–10.3)
Chloride: 96 mmol/L — ABNORMAL LOW (ref 98–111)
Creatinine, Ser: 0.98 mg/dL (ref 0.61–1.24)
GFR, Estimated: >60 mL/min (ref >60)
Glucose, Bld: 162 mg/dL — ABNORMAL HIGH (ref 70–99)
Potassium: 3.6 mmol/L (ref 3.5–5.1)
Sodium: 129 mmol/L — ABNORMAL LOW (ref 135–145)
Total Bilirubin: 0.9 mg/dL (ref 0.3–1.2)
Total Protein: 6.6 g/dL (ref 6.5–8.1)

## 2021-07-01 LAB — PHOSPHORUS: Phosphorus: 2.8 mg/dL (ref 2.5–4.6)

## 2021-07-01 LAB — TYPE AND SCREEN
ABO/RH(D): A POS
Antibody Screen: NEGATIVE
Unit division: 0

## 2021-07-01 LAB — HEMOGLOBIN AND HEMATOCRIT, BLOOD
HCT: 17.6 % — ABNORMAL LOW (ref 39.0–52.0)
Hemoglobin: 6.1 g/dL — CL (ref 13.0–17.0)

## 2021-07-01 LAB — VITAMIN B12: Vitamin B-12: 163 pg/mL — ABNORMAL LOW (ref 180–914)

## 2021-07-01 LAB — GLUCOSE, CAPILLARY
Glucose-Capillary: 166 mg/dL — ABNORMAL HIGH (ref 70–99)
Glucose-Capillary: 169 mg/dL — ABNORMAL HIGH (ref 70–99)
Glucose-Capillary: 205 mg/dL — ABNORMAL HIGH (ref 70–99)
Glucose-Capillary: 223 mg/dL — ABNORMAL HIGH (ref 70–99)

## 2021-07-01 LAB — MAGNESIUM: Magnesium: 1.8 mg/dL (ref 1.7–2.4)

## 2021-07-01 LAB — FOLATE: Folate: 19.4 ng/mL (ref >5.9)

## 2021-07-01 MED ORDER — GUAIFENESIN 100 MG/5ML PO SOLN
5.0000 mL | ORAL | Status: DC | PRN
  Administered 2021-07-01 – 2021-07-02 (×2): 100 mg via ORAL
  Filled 2021-07-01 (×2): qty 10

## 2021-07-01 MED ORDER — FENTANYL CITRATE (PF) 100 MCG/2ML IJ SOLN
INTRAMUSCULAR | Status: AC
  Filled 2021-07-01: qty 2

## 2021-07-01 MED ORDER — MIDAZOLAM HCL 2 MG/2ML IJ SOLN
INTRAMUSCULAR | Status: AC
  Filled 2021-07-01: qty 2

## 2021-07-01 MED ORDER — LIDOCAINE HCL (PF) 1 % IJ SOLN
INTRAMUSCULAR | Status: AC | PRN
  Administered 2021-07-01: 10 mL via INTRADERMAL

## 2021-07-01 MED ORDER — MIDAZOLAM HCL 2 MG/2ML IJ SOLN
INTRAMUSCULAR | Status: AC | PRN
  Administered 2021-07-01 (×2): 1 mg via INTRAVENOUS

## 2021-07-01 MED ORDER — FENTANYL CITRATE (PF) 100 MCG/2ML IJ SOLN
INTRAMUSCULAR | Status: AC | PRN
  Administered 2021-07-01 (×2): 50 ug via INTRAVENOUS

## 2021-07-01 MED ORDER — FUROSEMIDE 10 MG/ML IJ SOLN
20.0000 mg | Freq: Once | INTRAMUSCULAR | Status: AC
  Administered 2021-07-01: 20 mg via INTRAVENOUS
  Filled 2021-07-01: qty 2

## 2021-07-01 MED ORDER — SODIUM CHLORIDE 0.9% IV SOLUTION: Freq: Once | INTRAVENOUS | Status: AC

## 2021-07-01 NOTE — Progress Notes (Signed)
   07/01/21 2223  Vitals  Temp 98.4 F (36.9 C)  Temp Source Oral  BP 126/67  MAP (mmHg) 85  BP Location Right Arm  BP Method Automatic  Patient Position (if appropriate) Lying  Pulse Rate (!) 112  Pulse Rate Source Monitor  Resp 18  MEWS COLOR  MEWS Score Color Yellow  Oxygen Therapy  SpO2 98 %  O2 Device Nasal Cannula  O2 Flow Rate (L/min) 3 L/min  MEWS Score  MEWS Temp 0  MEWS Systolic 0  MEWS Pulse 2  MEWS RR 0  MEWS LOC 0  MEWS Score 2  Patient had a run of [place] per telemetry. Provider notified. Monitoring patient

## 2021-07-01 NOTE — Progress Notes (Signed)
   07/01/21 1528  Assess: MEWS Score  Temp 98.4 F (36.9 C)  BP 131/72  Pulse Rate (!) 105  Resp 18  SpO2 97 %  Assess: MEWS Score  MEWS Temp 0  MEWS Systolic 0  MEWS Pulse 1  MEWS RR 0  MEWS LOC 1  MEWS Score 2  MEWS Score Color Yellow  Assess: if the MEWS score is Yellow or Red  Were vital signs taken at a resting state? Yes  Focused Assessment No change from prior assessment  Does the patient meet 2 or more of the SIRS criteria? Yes  Does the patient have a confirmed or suspected source of infection? No  MEWS guidelines implemented *See Row Information* Yes  Take Vital Signs  Increase Vital Sign Frequency  Yellow: Q 2hr X 2 then Q 4hr X 2, if remains yellow, continue Q 4hrs  Escalate  MEWS: Escalate Yellow: discuss with charge nurse/RN and consider discussing with provider and RRT  Notify: Charge Nurse/RN  Name of Charge Nurse/RN Notified cindy  Date Charge Nurse/RN Notified 07/01/21  Time Charge Nurse/RN Notified 1529  Notify: Provider  Provider Name/Title [place] Pink  Date Provider Notified 07/01/21  Time Provider Notified 1547  Notification Type Page  Provider response No new orders  Date of Provider Response 07/01/21  Time of Provider Response 1547  Document  Patient Outcome Other (Comment) (will continue to monitor)  Progress note created (see row info) Yes  Assess: SIRS CRITERIA  SIRS Temperature  0  SIRS Pulse 1  SIRS Respirations  0  SIRS WBC 1  SIRS Score Sum  2

## 2021-07-01 NOTE — Procedures (Signed)
Vascular and Interventional Radiology Procedure Note  Patient: Theodore Jenkins DOB: 01-Oct-1946 Medical Record Number: [id number] Note Date/Time: 07/01/21 12:08 PM   Performing Physician: Michaelle Birks, MD Assistant(s): None  Diagnosis: Pancytopenia  Procedure: BONE MARROW BIOPSY  Anesthesia: Conscious Sedation Complications: None Estimated Blood Loss: Minimal Specimens: Sent for Pathology  Findings:  Successful CT-guided bone marrow biopsy A total of 2 cores were obtained. Hemostasis of the tract was achieved using Manual Pressure.  Plan: Bed rest for 1 hours.  See detailed procedure note with images in PACS. The patient tolerated the procedure well without incident or complication and was returned to Floor Bed in stable condition.    Michaelle Birks, MD [facility]   Clinic: [phone]

## 2021-07-01 NOTE — Progress Notes (Signed)
Rapid Response Event Note   Reason for Call :  HR increase, O2 needs increased  Initial Focused Assessment:  A&Ox4, wearing [place] @ 3 1/2L w/ O2 sat of 95-97%. BP rechecked (116/58 (75)). HR 118. R & L ULs sound wet, along w/ LLL.  Interventions:  Clarene Essex, NP paged for lasix. H&H ordered.  Plan of Care:  O2 turned down to 3L, floor RN is to assess O2 needs after lasix given.  RR RN will continue to monitor pt remotely   Event Summary:  H&H came back at 6.1. RR RN called floor RN to page oncologist for PRBC if he see's fit. (Note said oncologist is controlling PRBC delivery).  MD Notified: Clarene Essex, NP Call Time: 1900p Arrival Time: 1935p End Time: [place], RN

## 2021-07-01 NOTE — Progress Notes (Signed)
Theodore Jenkins  ACZ:[id number] DOB: August 19, 1946 DOA: 06/28/2021 PCP: Theodore Einstein, MD    Brief Narrative:  75yo w/ a hx of DM2, CAD s/p CABG, AoS s/p AVR, HTH, AAA, OSA, CKD, endocarditis 2021, and severe myelodysplastic syndrome (followed by Dr. [name]) who was admitted to [facility] on transfer from [name] where he presented on 7/16 with severe epistaxis. He was found to have a plt count of 7. Hematology/Oncology was consulted at the OSH, and he has been transfused a total of 5 phoresis units of plts. ENT intervened w/ a rhino rocket w/ resolved epistaxis. On 7/17 he developed gross hematuria. Urology was consulted and felt it was simply a consequence of his severe thrombocytopenia. The patient himself desired tx to [facility] to be seen by Dr. Marin Olp, who agreed. [facility] has facilitated the transfer.    Significant Events:  7/16 admitted to [facility] 7/17 L nare packed by ENT (08:47AM) 7/17 gross hematuria - Urology consulted  7/17 transfer to Theodore Jenkins at patient request  Consultants:  Hematology  ENT  Code Status: FULL CODE  Antimicrobials:  Cipro 7/19 >  DVT prophylaxis: SCDs  Subjective: Patient denies any complaints except for discomfort associated with his nasal packing.  Waiting on his bone marrow biopsy this morning.  His wife is at the bedside.    Assessment & Plan:  Myelodysplastic syndrome/severe thrombocytopenia with active bleeding Platelet count at time of presentation to [name] regional hospital was 7 - haptoglobin and LDH at time of presentation were normal - s/p 5 pheresis units of Plts at [facility] per hx. Hematology is following.  Additional transfusion ordered for today. Patient to undergo bone marrow biopsy today. Patient noted to be on aminocaproic acid.  Acute blood loss anemia on chronic anemia of MDS Hgb 7.5 at time of presentation to [name] - due to epistaxis, hematuria, MDS, and hx suggestive of UGIB.  Hematuria appears to have subsided.   Epistaxis also seems to be under control. Last transfused PRBC on 7/20   Epistaxis Patient was seen by ENT at [name] hospital prior to transfer.  Left nasal packing was placed.  Previous rounding MD discussed with Dr. Fredric Jenkins with ENT here who recommends leaving the packing in place for total of 5 days.  They requested that we call him on 7/22 by when packing may be removed after the cuff was deflated.  We will discuss with them tomorrow.      Gross hematuria Appears to have ceased - monitor UOP - try to avoid foley if able, but pt at risk for obstruction    Hyponatremia -chronic Sodium 122 at time of admission to [name] - has been trending upward overall - cont to follow    AKI on CKD  Baseline creatinine reportedly 1.2 -creatinine 1.7 at time of presentation to [facility] -renal function has normalized  CAD s/p CABG Asymtomatic presently   History of aortic stenosis s/p Bioprosthetic AVR Stable  Essential hypertension Blood pressure is reasonably well controlled.  Noted to be on metoprolol.   DM2 CBG reasonably controlled at present     Family Communication: Discussed with wife who was at the bedside  Status is: Inpatient  Remains inpatient appropriate because:Inpatient level of care appropriate due to severity of illness  Dispo: The patient is from: Home              Anticipated d/c is to: Home  Patient currently is not medically stable to d/c.   Difficult to place patient No   Objective: Blood pressure 103/67, pulse 80, temperature 98.2 F (36.8 C), resp. rate 18, height 5' 9"  (1.753 m), weight 93.5 kg, SpO2 98 %.  Intake/Output Summary (Last 24 hours) at 07/01/2021 1307 Last data filed at 07/01/2021 0600 Gross per 24 hour  Intake 1986.33 ml  Output 1200 ml  Net 786.33 ml    Filed Weights   06/28/21 1800  Weight: 93.5 kg    Examination:  General appearance: Awake alert.  In no distress Nasal packing noted in the left nostril Resp: Clear  to auscultation bilaterally.  Normal effort Cardio: S1-S2 is normal regular.  No S3-S4.  No rubs murmurs or bruit GI: Abdomen is soft.  Nontender nondistended.  Bowel sounds are present normal.  No masses organomegaly Extremities: No edema.  Full range of motion of lower extremities. Neurologic: Alert and oriented x3.  No focal neurological deficits.    CBC: Recent Labs  Lab 06/30/21 0526 06/30/21 2101 07/01/21 0600  WBC 1.3* 1.7* 1.7*  NEUTROABS 1.0* 1.3* 1.5*  HGB 6.4* 6.7* 6.9*  HCT 18.9* 19.1* 20.3*  MCV 84.0 82.7 83.9  PLT 6* 11* 9*    Basic Metabolic Panel: Recent Labs  Lab 06/29/21 0510 06/30/21 0526 07/01/21 0600  NA 129* 126* 129*  K 3.7 3.6 3.6  CL 101 98 96*  CO2 21* 21* 22  GLUCOSE 166* 148* 162*  BUN 18 19 19   CREATININE 0.90 1.02 0.98  CALCIUM 8.1* 7.8* 8.4*  MG  --   --  1.8  PHOS  --   --  2.8    GFR: Estimated Creatinine Clearance: 76.9 mL/min (by C-G formula based on SCr of 0.98 mg/dL).  Liver Function Tests: Recent Labs  Lab 06/29/21 0510 06/30/21 0526 07/01/21 0600  AST 22 31 32  ALT 20 34 41  ALKPHOS 42 44 49  BILITOT 0.8 1.0 0.9  PROT 6.6 6.1* 6.6  ALBUMIN 3.2* 2.9* 3.2*      Coagulation Profile: Recent Labs  Lab 06/29/21 0510  INR 1.3*      HbA1C: Hgb A1c MFr Bld  Date/Time Value Ref Range Status  06/29/2021 05:10 AM 6.5 (H) 4.8 - 5.6 % Final    Comment:    (NOTE) Pre diabetes:          5.7%-6.4%  Diabetes:              >6.4%  Glycemic control for   <7.0% adults with diabetes   12/11/2020 04:44 AM 5.6 4.8 - 5.6 % Final    Comment:    (NOTE) Pre diabetes:          5.7%-6.4%  Diabetes:              >6.4%  Glycemic control for   <7.0% adults with diabetes     CBG: Recent Labs  Lab 06/30/21 1136 06/30/21 1701 06/30/21 2059 07/01/21 0742 07/01/21 1222  GLUCAP 199* 220* 196* 166* 169*     Scheduled Meds:  aminocaproic acid  1 g Oral Q6H   Chlorhexidine Gluconate Cloth  6 each Topical Daily    ciprofloxacin  500 mg Oral BID   famciclovir  500 mg Oral Daily   fluconazole  100 mg Oral Daily   gabapentin  600 mg Oral QHS   insulin aspart  0-5 Units Subcutaneous QHS   insulin aspart  0-9 Units Subcutaneous TID WC   metoprolol succinate  50 mg Oral QHS   multivitamin with minerals  1 tablet Oral q morning   pantoprazole  40 mg Oral Daily   Ensure Max Protein  11 oz Oral BID   senna  1 tablet Oral BID   sodium chloride  1 spray Each Nare TID   Continuous Infusions:  sodium chloride 50 mL/hr at 06/29/21 1508     LOS: 3 days   [place] [facility] Office  [phone] Pager - Text Page per Shea Evans  If 7PM-7AM, please contact night-coverage per Amion 07/01/2021, 1:07 PM

## 2021-07-01 NOTE — Progress Notes (Signed)
   07/01/21 1858  Assess: MEWS Score  Resp (!) 25  Assess: MEWS Score  MEWS Temp 0  MEWS Systolic 1  MEWS Pulse 3  MEWS RR 1  MEWS LOC 0  MEWS Score 5  MEWS Score Color Red  Assess: if the MEWS score is Yellow or Red  Were vital signs taken at a resting state? Yes  Focused Assessment Change from prior assessment (see assessment flowsheet)  Does the patient meet 2 or more of the SIRS criteria? Yes  Does the patient have a confirmed or suspected source of infection? No  MEWS guidelines implemented *See Row Information* Yes  Take Vital Signs  Increase Vital Sign Frequency  Red: Q 1hr X 4 then Q 4hr X 4, if remains red, continue Q 4hrs  Escalate  MEWS: Escalate Red: discuss with charge nurse/RN and provider, consider discussing with RRT  Notify: Charge Nurse/RN  Name of Charge Nurse/RN Notified Jenny Reichmann  Date Charge Nurse/RN Notified 07/01/21  Time Charge Nurse/RN Notified 1859  Notify: Provider  Provider Name/Title [place] Pink  Date Provider Notified 07/01/21  Time Provider Notified 1859  Notification Type Page  Notification Reason Critical result  Notify: Rapid Response  Name of Rapid Response RN Notified ICU RN  Date Rapid Response Notified 07/01/21  Time Rapid Response Notified 1859  Document  Patient Outcome Other (Comment)  Progress note created (see row info) Yes  Assess: SIRS CRITERIA  SIRS Temperature  0  SIRS Pulse 1  SIRS Respirations  1  SIRS WBC 0  SIRS Score Sum  2

## 2021-07-01 NOTE — Progress Notes (Signed)
Theodore Jenkins is about the same.  There has been no bleeding.  He platelets and blood yesterday.  He goes for bone marrow biopsy today.  He is on antibiotic prophylaxis with ciprofloxacin/Diflucan/Famvir.  He had a little bit of a  temperature this morning 100.2.  There is been no diarrhea.  There has been no mouth sores.  He has had no nausea or vomiting.  He has been taking supplements.  I talked him about the possibility of physical therapy.  He is not sure how much this would really help.    The only labs I have back today are his chemistry studies.  His sodium is 129.  Potassium 3.6.  Blood sugar 162.  His albumin is 3.2.  I am awaiting the CBC to come back.  I suspect we probably will have to give him some more platelets.  His vital signs show temperature 100.2.  Pulse 78.  Blood pressure 100/48.  Oral exam shows no mucositis.  Lungs are clear bilaterally.  Cardiac exam regular rate and rhythm.  There may be an extra beat.  Abdomen is soft.  He has good bowel sounds.  There is no fluid wave.  There is no palpable liver or spleen tip.  Neurological exam is nonfocal.  Skin exam shows scattered ecchymoses.  Again, he goes for the bone marrow biopsy today.  He should hopefully get the nasal packing out tomorrow.  I still would like to think that we can get him over the weekend.  Hopefully after today he will be able to eat a lot better.  I know he is getting outstanding care from the dedicated staff up on [address].  Lattie Haw, MD  Philippians 4:13

## 2021-07-02 DIAGNOSIS — D61818 Other pancytopenia: Secondary | ICD-10-CM | POA: Diagnosis not present

## 2021-07-02 DIAGNOSIS — E871 Hypo-osmolality and hyponatremia: Secondary | ICD-10-CM | POA: Diagnosis not present

## 2021-07-02 DIAGNOSIS — D46Z Other myelodysplastic syndromes: Secondary | ICD-10-CM | POA: Diagnosis not present

## 2021-07-02 DIAGNOSIS — R040 Epistaxis: Secondary | ICD-10-CM | POA: Diagnosis not present

## 2021-07-02 LAB — BPAM PLATELET PHERESIS
Blood Product Expiration Date: 202207242359
Blood Product Expiration Date: 202207242359
ISSUE DATE / TIME: 202207211232
ISSUE DATE / TIME: 202207211505
Unit Type and Rh: 5100
Unit Type and Rh: 8400

## 2021-07-02 LAB — COMPREHENSIVE METABOLIC PANEL
ALT: 53 U/L — ABNORMAL HIGH (ref 0–44)
AST: 88 U/L — ABNORMAL HIGH (ref 15–41)
Albumin: 2.9 g/dL — ABNORMAL LOW (ref 3.5–5.0)
Alkaline Phosphatase: 62 U/L (ref 38–126)
Anion gap: 8 (ref 5–15)
BUN: 25 mg/dL — ABNORMAL HIGH (ref 8–23)
CO2: 22 mmol/L (ref 22–32)
Calcium: 7.8 mg/dL — ABNORMAL LOW (ref 8.9–10.3)
Chloride: 95 mmol/L — ABNORMAL LOW (ref 98–111)
Creatinine, Ser: 1.06 mg/dL (ref 0.61–1.24)
GFR, Estimated: >60 mL/min (ref >60)
Glucose, Bld: 208 mg/dL — ABNORMAL HIGH (ref 70–99)
Potassium: 3.6 mmol/L (ref 3.5–5.1)
Sodium: 125 mmol/L — ABNORMAL LOW (ref 135–145)
Total Bilirubin: 1.3 mg/dL — ABNORMAL HIGH (ref 0.3–1.2)
Total Protein: 6.3 g/dL — ABNORMAL LOW (ref 6.5–8.1)

## 2021-07-02 LAB — CBC WITH DIFFERENTIAL/PLATELET
Abs Immature Granulocytes: 0.24 10*3/uL — ABNORMAL HIGH (ref 0.00–0.07)
Basophils Absolute: 0 10*3/uL (ref 0.0–0.1)
Basophils Relative: 0 %
Eosinophils Absolute: 0 10*3/uL (ref 0.0–0.5)
Eosinophils Relative: 0 %
HCT: 17 % — ABNORMAL LOW (ref 39.0–52.0)
Hemoglobin: 5.9 g/dL — CL (ref 13.0–17.0)
Immature Granulocytes: 9 %
Lymphocytes Relative: 10 %
Lymphs Abs: 0.3 10*3/uL — ABNORMAL LOW (ref 0.7–4.0)
MCH: 29.4 pg (ref 26.0–34.0)
MCHC: 34.7 g/dL (ref 30.0–36.0)
MCV: 84.6 fL (ref 80.0–100.0)
Monocytes Absolute: 0.1 10*3/uL (ref 0.1–1.0)
Monocytes Relative: 3 %
Neutro Abs: 2.1 10*3/uL (ref 1.7–7.7)
Neutrophils Relative %: 78 %
Platelets: 23 10*3/uL — CL (ref 150–400)
RBC: 2.01 MIL/uL — ABNORMAL LOW (ref 4.22–5.81)
RDW: 16 % — ABNORMAL HIGH (ref 11.5–15.5)
WBC: 2.7 10*3/uL — ABNORMAL LOW (ref 4.0–10.5)
nRBC: 3.7 % — ABNORMAL HIGH (ref 0.0–0.2)

## 2021-07-02 LAB — PREPARE PLATELET PHERESIS
Unit division: 0
Unit division: 0

## 2021-07-02 LAB — GLUCOSE, CAPILLARY
Glucose-Capillary: 210 mg/dL — ABNORMAL HIGH (ref 70–99)
Glucose-Capillary: 191 mg/dL — ABNORMAL HIGH (ref 70–99)
Glucose-Capillary: 150 mg/dL — ABNORMAL HIGH (ref 70–99)
Glucose-Capillary: 185 mg/dL — ABNORMAL HIGH (ref 70–99)

## 2021-07-02 LAB — PREPARE RBC (CROSSMATCH)

## 2021-07-02 MED ORDER — FUROSEMIDE 10 MG/ML IJ SOLN
40.0000 mg | Freq: Once | INTRAMUSCULAR | Status: AC
  Administered 2021-07-02: 40 mg via INTRAVENOUS
  Filled 2021-07-02: qty 4

## 2021-07-02 MED ORDER — POLYETHYLENE GLYCOL 3350 17 G PO PACK
17.0000 g | PACK | Freq: Two times a day (BID) | ORAL | Status: DC
  Administered 2021-07-02 – 2021-07-03 (×3): 17 g via ORAL
  Filled 2021-07-02 (×3): qty 1

## 2021-07-02 MED ORDER — MUPIROCIN 2 % EX OINT
TOPICAL_OINTMENT | Freq: Two times a day (BID) | CUTANEOUS | Status: DC
  Administered 2021-07-02: 1 via NASAL
  Filled 2021-07-02: qty 22

## 2021-07-02 MED ORDER — METOPROLOL SUCCINATE ER 25 MG PO TB24: 12.5000 mg | ORAL_TABLET | Freq: Every day | ORAL | Status: DC

## 2021-07-02 MED ORDER — SALINE SPRAY 0.65 % NA SOLN
2.0000 | NASAL | Status: DC
  Administered 2021-07-02 – 2021-07-03 (×8): 2 via NASAL
  Filled 2021-07-02: qty 44

## 2021-07-02 MED ORDER — POTASSIUM CHLORIDE CRYS ER 20 MEQ PO TBCR
40.0000 meq | EXTENDED_RELEASE_TABLET | Freq: Once | ORAL | Status: AC
  Administered 2021-07-02: 40 meq via ORAL
  Filled 2021-07-02: qty 2

## 2021-07-02 MED ORDER — FUROSEMIDE 10 MG/ML IJ SOLN: 40.0000 mg | Freq: Once | INTRAMUSCULAR | Status: DC

## 2021-07-02 MED ORDER — SODIUM CHLORIDE 0.9% IV SOLUTION: Freq: Once | INTRAVENOUS | Status: AC

## 2021-07-02 NOTE — Progress Notes (Signed)
Theodore Jenkins  PJA:[id number] DOB: April 02, 1946 DOA: 06/28/2021 PCP: Karlene Einstein, MD     Brief Narrative:  75yo w/ a hx of DM2, CAD s/p CABG, AoS s/p AVR, HTH, AAA, OSA, CKD, endocarditis 2021, and severe myelodysplastic syndrome (followed by Dr. [name]) who was admitted to [facility] on transfer from [facility] where he presented on 7/16 with severe epistaxis. He was found to have a plt count of 7. Hematology/Oncology was consulted at the OSH, and he has been transfused a total of 5 phoresis units of plts. ENT intervened w/ a rhino rocket w/ resolved epistaxis. On 7/17 he developed gross hematuria. Urology was consulted and felt it was simply a consequence of his severe thrombocytopenia. The patient himself desired tx to [facility] to be seen by Dr. Marin Olp, who agreed.    Significant Events:  7/16 admitted to [facility] 7/17 L nare packed by ENT (08:47AM) 7/17 gross hematuria - Urology consulted  7/17 transfer to [facility] at patient request  Consultants:  Hematology  ENT  Code Status: FULL CODE  Antimicrobials:  Cipro 7/19 >  DVT prophylaxis: SCDs  Subjective: Overnight events noted.  Patient developed shortness of breath yesterday evening.'s investigative studies suggested volume overload.  Patient was given Lasix with improvement.  Feels okay this morning.  Denies any bleeding.     Assessment & Plan:  Myelodysplastic syndrome/severe thrombocytopenia with active bleeding Platelet count at time of presentation to [facility] hospital was 7 - haptoglobin and LDH at time of presentation were normal - s/p 5 pheresis units of Plts at [facility] per hx. Hematology continues to follow.  Additional platelet transfusion was given on 7/21.  Patient underwent bone marrow biopsy.  Also on aminocaproic acid.  Pulmonary edema due to multiple transfusions Patient was given furosemide with good diuresis overnight.  Feels better this morning.  Additional PRBC transfusions ordered by  hematology.  Lasix has also been ordered.  Continue to monitor closely  Acute blood loss anemia on chronic anemia of MDS Hgb 7.5 at time of presentation to [facility] - due to epistaxis, hematuria, MDS, and hx suggestive of UGIB.  Hematuria appears to have subsided.  Epistaxis also seems to be under control. Was transfused PRBC on 7/20.  Hemoglobin noted to be low again today.  Additional units ordered by hematology.   Epistaxis Patient was seen by ENT at [facility] hospital prior to transfer.  Left nasal packing was placed.  Previous rounding MD discussed with Dr. Fredric Dine with ENT here who recommends leaving the packing in place for total of 5 days.   Discussed with Dr. Fredric Dine this morning.  Nasal packing cuff to be deflated.  Discussed with nursing staff.  If there is no bleeding ENT will evaluate this afternoon and remove the packing.     Gross hematuria Appears to have ceased.   Hyponatremia -chronic Sodium 122 at time of admission to [facility].  Has improved.  Continue to monitor every so often for   AKI on CKD  Baseline creatinine reportedly 1.2 -creatinine 1.7 at time of presentation to [facility].  Improved.  CAD s/p CABG Asymtomatic presently   History of aortic stenosis s/p Bioprosthetic AVR Stable  Essential hypertension Blood pressure is reasonably well controlled.  Noted to be on metoprolol.   DM2 CBG reasonably controlled at present     Family Communication: Discussed with wife who was at the bedside  Status is: Inpatient  Remains inpatient appropriate because:Inpatient level of care appropriate  due to severity of illness  Dispo: The patient is from: Home              Anticipated d/c is to: Home              Patient currently is not medically stable to d/c.   Difficult to place patient No   Objective: Blood pressure (!) 111/57, pulse 97, temperature 98.3 F (36.8 C), resp. rate 20, height _0  (1.753 m), weight 93.5 kg, SpO2 99 %.  Intake/Output Summary (Last  24 hours) at 07/02/2021 1125 Last data filed at 07/02/2021 1100 Gross per 24 hour  Intake 2192.81 ml  Output 1300 ml  Net 892.81 ml    Filed Weights   06/28/21 1800  Weight: 93.5 kg    Examination:  General appearance: Awake alert.  In no distress Nasal packing in nostril.  No active bleeding noted. Resp: Normal effort at rest.  Good air entry bilaterally with few crackles at the bases.  No rhonchi.  No wheezing Cardio: S1-S2 is normal regular.  No S3-S4.  No rubs murmurs or bruit GI: Abdomen is soft.  Nontender nondistended.  Bowel sounds are present normal.  No masses organomegaly Extremities: No edema.  Full range of motion of lower extremities. Neurologic: Alert and oriented x3.  No focal neurological deficits.      CBC: Recent Labs  Lab 06/30/21 2101 07/01/21 0600 07/01/21 1955 07/02/21 0508  WBC 1.7* 1.7*  --  2.7*  NEUTROABS 1.3* 1.5*  --  2.1  HGB 6.7* 6.9* 6.1* 5.9*  HCT 19.1* 20.3* 17.6* 17.0*  MCV 82.7 83.9  --  84.6  PLT 11* 9*  --  23*    Basic Metabolic Panel: Recent Labs  Lab 06/30/21 0526 07/01/21 0600 07/02/21 0630  NA 126* 129* 125*  K 3.6 3.6 3.6  CL 98 96* 95*  CO2 21* 22 22  GLUCOSE 148* 162* 208*  BUN 19 19 25*  CREATININE 1.02 0.98 1.06  CALCIUM 7.8* 8.4* 7.8*  MG  --  1.8  --   PHOS  --  2.8  --     GFR: Estimated Creatinine Clearance: 71.1 mL/min (by C-G formula based on SCr of 1.06 mg/dL).  Liver Function Tests: Recent Labs  Lab 06/29/21 0510 06/30/21 0526 07/01/21 0600 07/02/21 0630  AST 22 31 32 88*  ALT 20 34 41 53*  ALKPHOS 42 44 49 62  BILITOT 0.8 1.0 0.9 1.3*  PROT 6.6 6.1* 6.6 6.3*  ALBUMIN 3.2* 2.9* 3.2* 2.9*      Coagulation Profile: Recent Labs  Lab 06/29/21 0510  INR 1.3*      HbA1C: Hgb A1c MFr Bld  Date/Time Value Ref Range Status  06/29/2021 05:10 AM 6.5 (H) 4.8 - 5.6 % Final    Comment:    (NOTE) Pre diabetes:          5.7%-6.4%  Diabetes:              >6.4%  Glycemic control for    <7.0% adults with diabetes   12/11/2020 04:44 AM 5.6 4.8 - 5.6 % Final    Comment:    (NOTE) Pre diabetes:          5.7%-6.4%  Diabetes:              >6.4%  Glycemic control for   <7.0% adults with diabetes     CBG: Recent Labs  Lab 07/01/21 0742 07/01/21 1222 07/01/21 1628 07/01/21 2231 07/02/21 0745  GLUCAP 166* 169* 205* 223* 210*     Scheduled Meds:  sodium chloride   Intravenous Once   aminocaproic acid  1 g Oral Q6H   Chlorhexidine Gluconate Cloth  6 each Topical Daily   ciprofloxacin  500 mg Oral BID   famciclovir  500 mg Oral Daily   fluconazole  100 mg Oral Daily   furosemide  40 mg Intravenous Once   gabapentin  600 mg Oral QHS   insulin aspart  0-5 Units Subcutaneous QHS   insulin aspart  0-9 Units Subcutaneous TID WC   metoprolol succinate  50 mg Oral QHS   multivitamin with minerals  1 tablet Oral q morning   pantoprazole  40 mg Oral Daily   polyethylene glycol  17 g Oral BID   Ensure Max Protein  11 oz Oral BID   senna  1 tablet Oral BID   sodium chloride  1 spray Each Nare TID   Continuous Infusions:  sodium chloride 50 mL/hr at 07/02/21 1100     LOS: 4 days   [place] [facility] Office  [phone] Pager - Text Page per Shea Evans  If 7PM-7AM, please contact night-coverage per Amion 07/02/2021, 11:25 AM

## 2021-07-02 NOTE — Progress Notes (Signed)
2RBC were given to the patient without any complications (PRNs were given prior, as per pt "this was always done prior)  Per MD to deflate the balloon. 4cc was taken x2 at separate time. No bleeding. Wet with NS - done. NT MD called and updated about no complications at this time. Per MD will come after 7pm. Patient is aware.

## 2021-07-02 NOTE — Progress Notes (Signed)
Looks like Theodore Jenkins  had a little bit of a difficult time last night.  Looks like he may have gone into a little bit of heart failure.  This might be high-output heart failure because his hemoglobin of 5.9.  He will clearly need to be transfused.  However, he needs to be transfused very slowly.  We also need to give quite a bit of Lasix in between units.  He got 2 units of platelets yesterday.  His platelet count today is 23,000.  Hopefully, the nasal packing can come out.  He is on Amicar.  He had his bone marrow test done yesterday.  I do not think we will have any results back until next week.  He may benefit from some physical therapy.  His wife has had back surgery.  She has known back issues.  I do not want to see her get hurt.  Hopefully, physical therapy might be able to strengthen him a little bit.  His metabolic panel is not back yet.  When I listened to his lungs, they sound pretty clear.  He was little bit tachycardic with his heart.  He has been afebrile.  He is on antibiotic prophylaxis.  He has had some constipation.  He is on some Senokot.  He also is on some MiraLAX, but I think this is as needed.   His vital signs show temperature of 98.2.  Pulse 103.  Blood pressure 100/56.  Oxygen saturation on 2 L is 93%.  Cardiac exam is tachycardic but regular.  Lungs sound pretty clear bilaterally.  I do not hear any wheezing.  Abdomen is soft.  Bowel sounds are present.  Extremities shows some trace edema bilaterally.  Neurological exam is nonfocal.  Skin exam does show the ecchymoses.  Theodore Jenkins does have a tough problem with respect to this myelodysplasia.  He has had significant cytopenia.  Again, hopefully the nasal packing can come out today.  I would think that with him being on Amicar, this will help with bleeding.  He will need to be transfused.  Again this will take quite a while as he cannot get blood to quickly for fear of heart failure.  I know he wants to go home  today.  I am not sure if he is ready to go home.  I know his wife is really dedicated to helping him out.  I just do not want to see anything bad happened to her.  Lattie Haw, MD  Theodore Jenkins

## 2021-07-02 NOTE — Progress Notes (Signed)
    OVERNIGHT PROGRESS REPORT  Notified by Rapid Response RN for patient with increased O2 requirements and increased work of breathing.  On Auscultation patient exhibited crackles bilaterally and had use of accessory muscles. Pt had limited output as of this point.  Order for lasix which has, at the time of this note returned +/- 1100 mL of urine and blood pressure is at normal parameters.  Patient's O2 requirements have returned to previous level and he is able to rest.  Hemoglobin has returned at levels consistent with previous draws and is being treated by Oncology  Gershon Cull MSNA MSN [place] Nurse Practitioner [place]

## 2021-07-02 NOTE — Consult Note (Signed)
ENT CONSULT:  Reason for Consult: Epistaxis  Referring Physician:  Dr. Vernell Barrier  HPI: Theodore Jenkins is an 75 y.o. male with PMhx of DM2, CAD s/p CABG, AoS s/p AVR, HTH, AAA, OSA, CKD, endocarditis 2021, and severe myelodysplastic syndrome (followed by Dr. [name]) who was admitted to [facility] on transfer from [facility] where he presented on 7/16 with severe epistaxis. Rhino rocket was placed by ENT in [facility], and ENT consulted for packing removal. Patient has not had any breakthrough bleeding since packing placed. Cuff was deflated several hours ago with no bleeding observed.    Past Medical History:  Diagnosis Date   Aortic stenosis    Echo (08/2017): Severe AS with mean gradient of 42 mmHg.   Barrett's esophagus    CAD (coronary artery disease) 06/06/2011   Remote stent to the LAD in 2005 // Last cath in 2010 showing diffuse CAD, small PD that is occluded and fills with left to right collaterals, 60-70% 1st OM, with normal EF. Managed medically // Myoview 3/18:  Normal perfusion. LVEF 55% with normal wall motion. This is a low risk study.   Complication of anesthesia    Diabetes mellitus    ED (erectile dysfunction)    Family history of adverse reaction to anesthesia 10/23/2017   sister had history of malignant hyperthermia when she was 71, she is 73 years old now   GERD (gastroesophageal reflux disease)    Goals of care, counseling/discussion 12/22/2020   Hyperlipidemia    Hypertension    IHD (ischemic heart disease)    prior stenting of the LAD in December of 2005; with last cath in 2010 showing diffuse CAD with normal LV function. He is managed medically.    Malignant hyperthermia    sister had reaction concerning for malignant hyperthermia (~ 1988)   MDS (myelodysplastic syndrome), high grade ([place]) 12/22/2020   Neuromuscular disorder ([place]) 10/23/2017   lumbar 4 disc in back causing nerve pain, on gabapentin   Obesity    Obstructive sleep apnea on CPAP    PONV (postoperative  nausea and vomiting)    S/P aortic valve replacement with bioprosthetic valve 10/26/2017   25 mm [facility] Ease bovine pericardial tissue valve   S/P coronary artery stent placement 2005   LAD    Past Surgical History:  Procedure Laterality Date   AORTIC VALVE REPLACEMENT N/A 10/26/2017   Procedure: AORTIC VALVE REPLACEMENT (AVR), using Magna Ease 25;  Surgeon: Rexene Alberts, MD;  Location: [place];  Service: Open Heart Surgery;  Laterality: N/A;   CARDIAC CATHETERIZATION  2010   NORMAL LV FUNCTION. DIFFUSE CORONARY DISEASE WITH THE DISTAL PORTION OF THE SMALL POSTERIOR DESCENDING VESSEL OCCLUDED AND FILLING WITH THE RIGHT COLLATERALS, 60-70% STENOSIS IN THE FIRST OBTUSE MARGINAL VESSEL AND DIFFUSE SMALL VESSEL DISEASE   CAROTID ENDARTERECTOMY     CIRCUMCISION  1985   CORONARY ARTERY BYPASS GRAFT N/A 10/26/2017   Procedure: CORONARY ARTERY BYPASS GRAFTING (CABG) x two , using left internal mammary artery and right leg greater saphenous vein harvested endoscopically;  Surgeon: Rexene Alberts, MD;  Location: [place];  Service: Open Heart Surgery;  Laterality: N/A;   CORONARY STENT PLACEMENT  2005   IR IMAGING GUIDED PORT INSERTION  12/28/2020   RIGHT/LEFT HEART CATH AND CORONARY ANGIOGRAPHY N/A 08/25/2017   Procedure: RIGHT/LEFT HEART CATH AND CORONARY ANGIOGRAPHY;  Surgeon: Nelva Bush, MD;  Location: [place] [facility];  Service: Cardiovascular;  Laterality: N/A;   rotator cuff surgery  Right    TEE WITHOUT CARDIOVERSION N/A 10/26/2017   Procedure: TRANSESOPHAGEAL ECHOCARDIOGRAM (TEE);  Surgeon: Rexene Alberts, MD;  Location: [place];  Service: Open Heart Surgery;  Laterality: N/A;   TEE WITHOUT CARDIOVERSION N/A 05/01/2020   Procedure: TRANSESOPHAGEAL ECHOCARDIOGRAM (TEE);  Surgeon: Buford Dresser, MD;  Location: [facility];  Service: Cardiovascular;  Laterality: N/A;    Family History  Problem Relation Age of Onset   Cancer Mother    Heart attack Father 64   CAD  Brother     Social History:  reports that he quit smoking about 28 years ago. His smoking use included cigarettes. He has a 20.00 pack-year smoking history. He has never used smokeless tobacco. He reports current alcohol use of about 1.0 standard drink of alcohol per week. He reports that he does not use drugs.  Allergies:  Allergies  Allergen Reactions   Oxycodone Nausea And Vomiting    Medications: I have reviewed the patient's current medications.  Results for orders placed or performed during the hospital encounter of 06/28/21 (from the past 48 hour(s))  CBC with Differential     Status: Abnormal   Collection Time: 07/01/21  6:00 AM  Result Value Ref Range   WBC 1.7 (L) 4.0 - 10.5 K/uL   RBC 2.42 (L) 4.22 - 5.81 MIL/uL   Hemoglobin 6.9 (LL) 13.0 - 17.0 g/dL    Comment: CRITICAL VALUE NOTED.  VALUE IS CONSISTENT WITH PREVIOUSLY REPORTED AND CALLED VALUE. REPEATED TO VERIFY    HCT 20.3 (L) 39.0 - 52.0 %   MCV 83.9 80.0 - 100.0 fL   MCH 28.5 26.0 - 34.0 pg   MCHC 34.0 30.0 - 36.0 g/dL   RDW 15.3 11.5 - 15.5 %   Platelets 9 (LL) 150 - 400 K/uL    Comment: Immature Platelet Fraction may be clinically indicated, consider ordering this additional test [id number] CONSISTENT WITH PREVIOUS RESULT CRITICAL VALUE NOTED.  VALUE IS CONSISTENT WITH PREVIOUSLY REPORTED AND CALLED VALUE.    nRBC 2.3 (H) 0.0 - 0.2 %   Neutrophils Relative % 85 %   Neutro Abs 1.5 (L) 1.7 - 7.7 K/uL   Lymphocytes Relative 11 %   Lymphs Abs 0.2 (L) 0.7 - 4.0 K/uL   Monocytes Relative 2 %   Monocytes Absolute 0.0 (L) 0.1 - 1.0 K/uL   Eosinophils Relative 0 %   Eosinophils Absolute 0.0 0.0 - 0.5 K/uL   Basophils Relative 0 %   Basophils Absolute 0.0 0.0 - 0.1 K/uL   Immature Granulocytes 2 %   Abs Immature Granulocytes 0.04 0.00 - 0.07 K/uL    Comment: Performed at [facility], [place] [address] [place], [place] [address]  Comprehensive metabolic panel     Status: Abnormal   Collection  Time: 07/01/21  6:00 AM  Result Value Ref Range   Sodium 129 (L) 135 - 145 mmol/L   Potassium 3.6 3.5 - 5.1 mmol/L   Chloride 96 (L) 98 - 111 mmol/L   CO2 22 22 - 32 mmol/L   Glucose, Bld 162 (H) 70 - 99 mg/dL    Comment: Glucose reference range applies only to samples taken after fasting for at least 8 hours.   BUN 19 8 - 23 mg/dL   Creatinine, Ser 0.98 0.61 - 1.24 mg/dL   Calcium 8.4 (L) 8.9 - 10.3 mg/dL   Total Protein 6.6 6.5 - 8.1 g/dL   Albumin 3.2 (L) 3.5 - 5.0 g/dL   AST 32 15 -  41 U/L   ALT 41 0 - 44 U/L   Alkaline Phosphatase 49 38 - 126 U/L   Total Bilirubin 0.9 0.3 - 1.2 mg/dL   GFR, Estimated >60 >60 mL/min    Comment: (NOTE) Calculated using the CKD-EPI Creatinine Equation (2021)    Anion gap 11 5 - 15    Comment: Performed at [facility], [place] [address], [place], [place] [address]  Magnesium     Status: None   Collection Time: 07/01/21  6:00 AM  Result Value Ref Range   Magnesium 1.8 1.7 - 2.4 mg/dL    Comment: Performed at [facility], [place] [address] [place], [place] [address]  Phosphorus     Status: None   Collection Time: 07/01/21  6:00 AM  Result Value Ref Range   Phosphorus 2.8 2.5 - 4.6 mg/dL    Comment: Performed at [facility], [place] [address] [place], [place] [address]  Vitamin B12     Status: Abnormal   Collection Time: 07/01/21  6:00 AM  Result Value Ref Range   Vitamin B-12 163 (L) 180 - 914 pg/mL    Comment: (NOTE) This assay is not validated for testing neonatal or myeloproliferative syndrome specimens for Vitamin B12 levels. Performed at [facility], [place] [address] [place], [place] [address]   Folate     Status: None   Collection Time: 07/01/21  6:00 AM  Result Value Ref Range   Folate 19.4 >5.9 ng/mL    Comment: Performed at [facility], [place] [address], [place], [place] [address]  Glucose, capillary     Status: Abnormal   Collection  Time: 07/01/21  7:42 AM  Result Value Ref Range   Glucose-Capillary 166 (H) 70 - 99 mg/dL    Comment: Glucose reference range applies only to samples taken after fasting for at least 8 hours.  Prepare Pheresed Platelets     Status: None   Collection Time: 07/01/21 11:00 AM  Result Value Ref Range   Unit Number [id number]    Blood Component Type PLTP2 PSORALEN TREATED    Unit division 00    Status of Unit ISSUED,FINAL    Transfusion Status      OK TO TRANSFUSE Performed at [facility] [address] [place], [place] [address]    Unit Number [id number]    Blood Component Type PLTP3 PSORALEN TREATED    Unit division 00    Status of Unit ISSUED,FINAL    Transfusion Status OK TO TRANSFUSE   Glucose, capillary     Status: Abnormal   Collection Time: 07/01/21 12:22 PM  Result Value Ref Range   Glucose-Capillary 169 (H) 70 - 99 mg/dL    Comment: Glucose reference range applies only to samples taken after fasting for at least 8 hours.  Glucose, capillary     Status: Abnormal   Collection Time: 07/01/21  4:28 PM  Result Value Ref Range   Glucose-Capillary 205 (H) 70 - 99 mg/dL    Comment: Glucose reference range applies only to samples taken after fasting for at least 8 hours.  Hemoglobin and hematocrit, blood     Status: Abnormal   Collection Time: 07/01/21  7:55 PM  Result Value Ref Range   Hemoglobin 6.1 (LL) 13.0 - 17.0 g/dL    Comment: CRITICAL VALUE NOTED.  VALUE IS CONSISTENT WITH PREVIOUSLY REPORTED AND CALLED VALUE. REPEATED TO VERIFY    HCT 17.6 (L) 39.0 - 52.0 %  Comment: Performed at [facility], [place] [address], [place], [place] [address]  Glucose, capillary     Status: Abnormal   Collection Time: 07/01/21 10:31 PM  Result Value Ref Range   Glucose-Capillary 223 (H) 70 - 99 mg/dL    Comment: Glucose reference range applies only to samples taken after fasting for at least 8 hours.  CBC with Differential     Status:  Abnormal   Collection Time: 07/02/21  5:08 AM  Result Value Ref Range   WBC 2.7 (L) 4.0 - 10.5 K/uL   RBC 2.01 (L) 4.22 - 5.81 MIL/uL   Hemoglobin 5.9 (LL) 13.0 - 17.0 g/dL    Comment: CRITICAL VALUE NOTED.  VALUE IS CONSISTENT WITH PREVIOUSLY REPORTED AND CALLED VALUE. REPEATED TO VERIFY    HCT 17.0 (L) 39.0 - 52.0 %   MCV 84.6 80.0 - 100.0 fL   MCH 29.4 26.0 - 34.0 pg   MCHC 34.7 30.0 - 36.0 g/dL   RDW 16.0 (H) 11.5 - 15.5 %   Platelets 23 (LL) 150 - 400 K/uL    Comment: Immature Platelet Fraction may be clinically indicated, consider ordering this additional test [id number] CONSISTENT WITH PREVIOUS RESULT REPEATED TO VERIFY    nRBC 3.7 (H) 0.0 - 0.2 %   Neutrophils Relative % 78 %   Neutro Abs 2.1 1.7 - 7.7 K/uL   Lymphocytes Relative 10 %   Lymphs Abs 0.3 (L) 0.7 - 4.0 K/uL   Monocytes Relative 3 %   Monocytes Absolute 0.1 0.1 - 1.0 K/uL   Eosinophils Relative 0 %   Eosinophils Absolute 0.0 0.0 - 0.5 K/uL   Basophils Relative 0 %   Basophils Absolute 0.0 0.0 - 0.1 K/uL   Immature Granulocytes 9 %   Abs Immature Granulocytes 0.24 (H) 0.00 - 0.07 K/uL    Comment: Performed at [facility], [place] [address], [place], [place] [address]  Comprehensive metabolic panel     Status: Abnormal   Collection Time: 07/02/21  6:30 AM  Result Value Ref Range   Sodium 125 (L) 135 - 145 mmol/L   Potassium 3.6 3.5 - 5.1 mmol/L   Chloride 95 (L) 98 - 111 mmol/L   CO2 22 22 - 32 mmol/L   Glucose, Bld 208 (H) 70 - 99 mg/dL    Comment: Glucose reference range applies only to samples taken after fasting for at least 8 hours.   BUN 25 (H) 8 - 23 mg/dL   Creatinine, Ser 1.06 0.61 - 1.24 mg/dL   Calcium 7.8 (L) 8.9 - 10.3 mg/dL   Total Protein 6.3 (L) 6.5 - 8.1 g/dL   Albumin 2.9 (L) 3.5 - 5.0 g/dL   AST 88 (H) 15 - 41 U/L   ALT 53 (H) 0 - 44 U/L   Alkaline Phosphatase 62 38 - 126 U/L   Total Bilirubin 1.3 (H) 0.3 - 1.2 mg/dL   GFR, Estimated >60 >60 mL/min    Comment:  (NOTE) Calculated using the CKD-EPI Creatinine Equation (2021)    Anion gap 8 5 - 15    Comment: Performed at [facility], [place] [address], [place], [place] [address]  Type and screen [place]     Status: None (Preliminary result)   Collection Time: 07/02/21  7:35 AM  Result Value Ref Range   ABO/RH(D) A POS    Antibody Screen NEG    Sample Expiration 07/05/2021,2359    Unit Number [id number]  Blood Component Type RED CELLS,LR    Unit division 00    Status of Unit ISSUED    Transfusion Status OK TO TRANSFUSE    Crossmatch Result Compatible    Unit Number [id number]    Blood Component Type RED CELLS,LR    Unit division 00    Status of Unit ISSUED    Transfusion Status OK TO TRANSFUSE    Crossmatch Result      Compatible Performed at [facility] [address] [place], [place] [address]   Prepare RBC (crossmatch)     Status: None   Collection Time: 07/02/21  7:35 AM  Result Value Ref Range   Order Confirmation      ORDER PROCESSED BY BLOOD BANK Performed at [facility], [place] [address], [place], [place] [address]   Glucose, capillary     Status: Abnormal   Collection Time: 07/02/21  7:45 AM  Result Value Ref Range   Glucose-Capillary 210 (H) 70 - 99 mg/dL    Comment: Glucose reference range applies only to samples taken after fasting for at least 8 hours.  Glucose, capillary     Status: Abnormal   Collection Time: 07/02/21 11:50 AM  Result Value Ref Range   Glucose-Capillary 191 (H) 70 - 99 mg/dL    Comment: Glucose reference range applies only to samples taken after fasting for at least 8 hours.  Glucose, capillary     Status: Abnormal   Collection Time: 07/02/21  4:29 PM  Result Value Ref Range   Glucose-Capillary 150 (H) 70 - 99 mg/dL    Comment: Glucose reference range applies only to samples taken after fasting for at least 8 hours.    DG Chest 1 View  Result Date:  07/01/2021 CLINICAL DATA:  Shortness of breath. History of myelodysplastic syndrome. EXAM: CHEST  1 VIEW COMPARISON:  Most recent radiograph 12/10/2020, CT 12/11/2020 FINDINGS: Right chest port in place. Post median sternotomy with aortic valve replacement. Similar cardiomegaly. Patchy airspace opacity in the right infrahilar lung. Mild interstitial in septal thickening suggestive of pulmonary edema. No pleural fluid or pneumothorax. IMPRESSION: 1. Cardiomegaly with mild interstitial thickening, suspicious for pulmonary edema. 2. Patchy airspace opacity in the right infrahilar lung, may be pneumonia or asymmetric alveolar edema. Electronically Signed   By: Keith Rake M.D.   On: 07/01/2021 19:47   CT BIOPSY  Result Date: 07/01/2021 INDICATION: 75 year old male with pancytopenia. EXAM: CT GUIDED BONE MARROW ASPIRATION AND CORE BIOPSY MEDICATIONS: None. ANESTHESIA/SEDATION: Moderate (conscious) sedation was employed during this procedure. A total of to milligrams versed and 100 micrograms fentanyl were administered intravenously. The patient's level of consciousness and vital signs were monitored continuously by radiology nursing throughout the procedure under my direct supervision. Total monitored sedation time: 20 minutes FLUOROSCOPY TIME:  CT dose (total DLP): 196 mGy cm. COMPLICATIONS: None immediate. Estimated blood loss: <5 mL PROCEDURE: Informed written consent was obtained from the patient after a thorough discussion of the procedural risks, benefits and alternatives. All questions were addressed. Maximal Sterile Barrier Technique was utilized including caps, mask, sterile gowns, sterile gloves, sterile drape, hand hygiene and skin antiseptic. A timeout was performed prior to the initiation of the procedure. The patient was positioned prone and non-contrast localization CT was performed of the pelvis to demonstrate the iliac marrow spaces. Maximal barrier sterile technique utilized including caps,  mask, sterile gowns, sterile gloves, large sterile drape, hand hygiene, and chlorhexidine prep. Under sterile conditions and local anesthesia, an 11  gauge coaxial bone biopsy needle was advanced into the right iliac marrow space. Needle position was confirmed with CT imaging. Initially, bone marrow aspiration was performed. Next, the 11 gauge outer cannula was utilized to obtain a right iliac bone marrow core biopsy. Needle was removed. Hemostasis was obtained with compression. The patient tolerated the procedure well. Samples were prepared with the cytotechnologist. IMPRESSION: Successful CT-guided bone marrow aspiration and biopsy. Electronically Signed   By: Michaelle Birks MD   On: 07/01/2021 14:16   CT BONE MARROW BIOPSY & ASPIRATION  Result Date: 07/01/2021 Michaelle Birks, MD     07/01/2021 12:09 PM Vascular and Interventional Radiology Procedure Note Patient: TAIWO FISH DOB: [birth year] Medical Record Number: [id number] Note Date/Time: 07/01/21 12:08 PM Performing Physician: Michaelle Birks, MD Assistant(s): None Diagnosis: Pancytopenia Procedure: BONE MARROW BIOPSY Anesthesia: Conscious Sedation Complications: None Estimated Blood Loss: Minimal Specimens: Sent for Pathology Findings: Successful CT-guided bone marrow biopsy A total of 2 cores were obtained. Hemostasis of the tract was achieved using Manual Pressure. Plan: Bed rest for 1 hours. See detailed procedure note with images in PACS. The patient tolerated the procedure well without incident or complication and was returned to Floor Bed in stable condition. Michaelle Birks, MD Vascular [facility] Interventional Radiology [facility] Clinic: [phone]    ROS:ROS  Blood pressure 107/71, pulse 96, temperature 98.4 F (36.9 C), temperature source Oral, resp. rate 18, height 5' 9" (1.753 m), weight 93.5 kg, SpO2 98 %.  PHYSICAL EXAM: CONSTITUTIONAL: No acute distress and alert and oriented x 3 PULMONARY/CHEST WALL: effort normal and no  stridor, no stertor, no dysphonia HENT: Head : normocephalic and atraumatic Ears: Right ear:   canal normal, external ear normal and hearing normal Left ear:   canal normal, external ear normal and hearing normal Nose: Rhino rocket removed from left nare, left septal mucosa excoriated but with no active bleeding. Very large scab noted on right caudal septum, no active bleeding  Mouth/Throat:  Mouth: uvula midline and no oral lesions Throat: oropharynx clear and moist, no bleeding  Assessment/Plan: Garland Hincapie is a 75 y/o M with PMhx of DM2, CAD s/p CABG, AoS s/p AVR, HTH, AAA, OSA, CKD, endocarditis 2021, and severe myelodysplastic syndrome (followed by Dr. [name])  with severe left sided epistaxis, controlled with rhino rocket placed by ENT in [place] removed at bedside with physical exam findings as above -Hold off on CPAP tonight, oxygen delivery if needed via humidified mask, DO NOT use nasal cannula.  -OK to resume CPAP tomorrow if no bleeding -Recommend nasal saline spray every 2 hours -Application of mupirocin ointment to bilateral nares BID -No nose blowing, no nasal manipulation -Afrin and manual pressure as needed for bleeding -FU with ENT as needed   Thank you for allowing me to participate in the care of this patient. Please do not hesitate to contact me with any questions or concerns.   Jason Coop, [employer] [facility] Cell: [phone]   07/02/2021, 9:06 PM

## 2021-07-03 ENCOUNTER — Other Ambulatory Visit (HOSPITAL_COMMUNITY): Payer: Self-pay

## 2021-07-03 ENCOUNTER — Encounter: Payer: Self-pay | Admitting: Hematology & Oncology

## 2021-07-03 DIAGNOSIS — D46Z Other myelodysplastic syndromes: Secondary | ICD-10-CM | POA: Diagnosis not present

## 2021-07-03 DIAGNOSIS — E871 Hypo-osmolality and hyponatremia: Secondary | ICD-10-CM | POA: Diagnosis not present

## 2021-07-03 DIAGNOSIS — D61818 Other pancytopenia: Secondary | ICD-10-CM | POA: Diagnosis not present

## 2021-07-03 DIAGNOSIS — R040 Epistaxis: Secondary | ICD-10-CM | POA: Diagnosis not present

## 2021-07-03 LAB — COMPREHENSIVE METABOLIC PANEL
ALT: 50 U/L — ABNORMAL HIGH (ref 0–44)
AST: 62 U/L — ABNORMAL HIGH (ref 15–41)
Albumin: 3.1 g/dL — ABNORMAL LOW (ref 3.5–5.0)
Alkaline Phosphatase: 62 U/L (ref 38–126)
Anion gap: 8 (ref 5–15)
BUN: 23 mg/dL (ref 8–23)
CO2: 23 mmol/L (ref 22–32)
Calcium: 8 mg/dL — ABNORMAL LOW (ref 8.9–10.3)
Chloride: 94 mmol/L — ABNORMAL LOW (ref 98–111)
Creatinine, Ser: 1.15 mg/dL (ref 0.61–1.24)
GFR, Estimated: >60 mL/min (ref >60)
Glucose, Bld: 175 mg/dL — ABNORMAL HIGH (ref 70–99)
Potassium: 3.5 mmol/L (ref 3.5–5.1)
Sodium: 125 mmol/L — ABNORMAL LOW (ref 135–145)
Total Bilirubin: 1.4 mg/dL — ABNORMAL HIGH (ref 0.3–1.2)
Total Protein: 6.4 g/dL — ABNORMAL LOW (ref 6.5–8.1)

## 2021-07-03 LAB — CBC WITH DIFFERENTIAL/PLATELET
Abs Immature Granulocytes: 0.03 10*3/uL (ref 0.00–0.07)
Basophils Absolute: 0 10*3/uL (ref 0.0–0.1)
Basophils Relative: 0 %
Eosinophils Absolute: 0 10*3/uL (ref 0.0–0.5)
Eosinophils Relative: 0 %
HCT: 22.3 % — ABNORMAL LOW (ref 39.0–52.0)
Hemoglobin: 7.7 g/dL — ABNORMAL LOW (ref 13.0–17.0)
Immature Granulocytes: 1 %
Lymphocytes Relative: 8 %
Lymphs Abs: 0.2 10*3/uL — ABNORMAL LOW (ref 0.7–4.0)
MCH: 29.3 pg (ref 26.0–34.0)
MCHC: 34.5 g/dL (ref 30.0–36.0)
MCV: 84.8 fL (ref 80.0–100.0)
Monocytes Absolute: 0.1 10*3/uL (ref 0.1–1.0)
Monocytes Relative: 3 %
Neutro Abs: 2 10*3/uL (ref 1.7–7.7)
Neutrophils Relative %: 88 %
Platelets: 11 10*3/uL — CL (ref 150–400)
RBC: 2.63 MIL/uL — ABNORMAL LOW (ref 4.22–5.81)
RDW: 15.1 % (ref 11.5–15.5)
WBC: 2.3 10*3/uL — ABNORMAL LOW (ref 4.0–10.5)
nRBC: 5.3 % — ABNORMAL HIGH (ref 0.0–0.2)

## 2021-07-03 LAB — BPAM RBC
Blood Product Expiration Date: 202208112359
Blood Product Expiration Date: 202208112359
ISSUE DATE / TIME: 202207221002
ISSUE DATE / TIME: 202207221319
Unit Type and Rh: 6200
Unit Type and Rh: 6200

## 2021-07-03 LAB — TYPE AND SCREEN
ABO/RH(D): A POS
Antibody Screen: NEGATIVE
Unit division: 0
Unit division: 0

## 2021-07-03 LAB — GLUCOSE, CAPILLARY
Glucose-Capillary: 157 mg/dL — ABNORMAL HIGH (ref 70–99)
Glucose-Capillary: 188 mg/dL — ABNORMAL HIGH (ref 70–99)

## 2021-07-03 MED ORDER — FAMCICLOVIR 500 MG PO TABS
500.0000 mg | ORAL_TABLET | Freq: Every day | ORAL | 2 refills | Status: DC
Start: 2021-07-03 — End: 2021-07-29

## 2021-07-03 MED ORDER — POLYETHYLENE GLYCOL 3350 17 G PO PACK: 17.0000 g | PACK | Freq: Two times a day (BID) | ORAL | 0 refills | Status: DC

## 2021-07-03 MED ORDER — SODIUM CHLORIDE 0.9% IV SOLUTION: Freq: Once | INTRAVENOUS | Status: AC

## 2021-07-03 MED ORDER — AMINOCAPROIC ACID 500 MG PO TABS
1000.0000 mg | ORAL_TABLET | Freq: Three times a day (TID) | ORAL | 2 refills | Status: DC
  Filled 2021-07-03: qty 18, 3d supply, fill #0
  Filled 2021-07-15: qty 30, 5d supply, fill #1
  Filled 2021-07-19: qty 10, 2d supply, fill #2

## 2021-07-03 MED ORDER — FLUCONAZOLE 100 MG PO TABS: 100.0000 mg | ORAL_TABLET | Freq: Every day | ORAL | 2 refills | Status: AC

## 2021-07-03 MED ORDER — SENNA 8.6 MG PO TABS: 1.0000 | ORAL_TABLET | Freq: Two times a day (BID) | ORAL | 0 refills | Status: AC

## 2021-07-03 MED ORDER — OXYMETAZOLINE HCL 0.05 % NA SOLN: 1.0000 | Freq: Two times a day (BID) | NASAL | 0 refills | Status: DC

## 2021-07-03 MED ORDER — ACETAMINOPHEN 325 MG PO TABS
650.0000 mg | ORAL_TABLET | Freq: Once | ORAL | Status: AC
  Administered 2021-07-03: 650 mg via ORAL
  Filled 2021-07-03: qty 2

## 2021-07-03 MED ORDER — LACTULOSE 10 GM/15ML PO SOLN
20.0000 g | Freq: Once | ORAL | Status: AC
  Administered 2021-07-03: 20 g via ORAL
  Filled 2021-07-03: qty 30

## 2021-07-03 MED ORDER — OXYMETAZOLINE HCL 0.05 % NA SOLN
1.0000 | Freq: Two times a day (BID) | NASAL | Status: DC
  Administered 2021-07-03: 1 via NASAL
  Filled 2021-07-03: qty 15

## 2021-07-03 MED ORDER — MUPIROCIN 2 % EX OINT: TOPICAL_OINTMENT | Freq: Two times a day (BID) | CUTANEOUS | 0 refills | Status: DC

## 2021-07-03 MED ORDER — SALINE SPRAY 0.65 % NA SOLN
2.0000 | NASAL | 0 refills | Status: DC
Start: 2021-07-03 — End: 2021-07-29

## 2021-07-03 NOTE — Progress Notes (Signed)
OT Cancellation Note (Late entry)  Patient Details Name: KAHLEE COOKSON MRN: [id number] DOB: 1946/08/16   Cancelled Treatment:    Reason Eval/Treat Not Completed: Other (comment) Pt noted with low platelet count. Discussed pt with RN around 10AM with plans to begin transfusing platelets at that time. Will follow-up s/p platelet replacement as schedule permits.   Layla Maw 07/03/2021, 12:38 PM

## 2021-07-03 NOTE — Evaluation (Signed)
Occupational Therapy Evaluation/Discharge Patient Details Name: Theodore Jenkins MRN: [id number] DOB: Jun 08, 1946 Today's Date: 07/03/2021    History of Present Illness Theodore Jenkins is a 75 y.o. male presenting with epistasis and hematuria due to thrombocytopenia. PMH: aortic stenosis, CAD, HTN, IHD s/p stent placement in 2005, aortic valve replacement in 2019, Barrett's esophagus, GERD, DM, HLD, OSA on CPAP,  high-grade myelodysplastic syndrome   Clinical Impression   Pt received in chair and eager to return home today after having platelets replaced. Pt overall Modified Independent with mobility to/from bathroom using RW with cautious pace. Pt able to demo functional skills to complete all ADLs with Modified Independence. No LOB, SOB or safety concerns noted. Pt's wife present and supportive, can assist as needed at home. Pt/family report well-equipped with DME. No further skilled OT services needed at acute level or on DC. OT to sign off.     Follow Up Recommendations  No OT follow up    Equipment Recommendations  None recommended by OT    Recommendations for Other Services       Precautions / Restrictions Precautions Precautions: Fall Restrictions Weight Bearing Restrictions: No      Mobility Bed Mobility               General bed mobility comments: up in chair    Transfers Overall transfer level: Modified independent Equipment used: Rolling walker (2 wheeled)                  Balance Overall balance assessment: Needs assistance Sitting-balance support: No upper extremity supported;Feet supported Sitting balance-Leahy Scale: Good     Standing balance support: Bilateral upper extremity supported;During functional activity Standing balance-Leahy Scale: Fair Standing balance comment: fair static standing, appears more confident with BUE support during mobility                           ADL either performed or assessed with clinical judgement   ADL  Overall ADL's : Modified independent                                       General ADL Comments: able to ambulate to/from bathroom with RW and without LOB, able to reach to socks though does endorse stiffness.     Vision Baseline Vision/History: Wears glasses Wears Glasses: At all times Patient Visual Report: No change from baseline Vision Assessment?: No apparent visual deficits     Perception     Praxis      Pertinent Vitals/Pain Pain Assessment: No/denies pain     Hand Dominance Right   Extremity/Trunk Assessment Upper Extremity Assessment Upper Extremity Assessment: Overall WFL for tasks assessed   Lower Extremity Assessment Lower Extremity Assessment: Defer to PT evaluation   Cervical / Trunk Assessment Cervical / Trunk Assessment: Normal   Communication Communication Communication: No difficulties   Cognition Arousal/Alertness: Awake/alert Behavior During Therapy: WFL for tasks assessed/performed Overall Cognitive Status: Within Functional Limits for tasks assessed                                     General Comments  Wife present and supportive    Exercises     Shoulder Instructions      Home Living Family/patient expects to be discharged to:: Private residence Living  Arrangements: Spouse/significant other Available Help at Discharge: Family;Available 24 hours/day Type of Home: House Home Access: Stairs to enter [employer] of Steps: 1+1 at front without rails, 4-5 on back with railings   Home Layout: Two level;Able to live on main level with bedroom/bathroom;Laundry or work area in [place] Shower/Tub: Occupational psychologist: [place]: Environmental consultant - 2 wheels;Cane - single point;Shower seat;Walker - 4 wheels;Wheelchair - manual;Grab bars - tub/shower;Grab bars - toilet          Prior Functioning/Environment Level of Independence: Independent with assistive  device(s)        Comments: Use of cane vs walker for mobility, Able to complete ADLs without assist. Wife has been completing laundry in basement area recently        OT Problem List:        OT Treatment/Interventions:      OT Goals(Current goals can be found in the care plan section) Acute Rehab OT Goals Patient Stated Goal: go home today OT Goal Formulation: All assessment and education complete, DC therapy  OT Frequency:     Barriers to D/C:            Co-evaluation              AM-PAC OT "6 Clicks" Daily Activity     Outcome Measure Help from another person eating meals?: None Help from another person taking care of personal grooming?: None Help from another person toileting, which includes using toliet, bedpan, or urinal?: None Help from another person bathing (including washing, rinsing, drying)?: None Help from another person to put on and taking off regular upper body clothing?: None Help from another person to put on and taking off regular lower body clothing?: None 6 Click Score: 24   End of Session Equipment Utilized During Treatment: Rolling walker Nurse Communication: Mobility status  Activity Tolerance: Patient tolerated treatment well Patient left: in chair;with call bell/phone within reach;with family/visitor present  OT Visit Diagnosis: Unsteadiness on feet (R26.81);Muscle weakness (generalized) (M62.81)                Time: [id number] OT Time Calculation (min): 12 min Charges:  OT General Charges $OT Visit: 1 Visit OT Evaluation $OT Eval Low Complexity: 1 Low  Malachy Chamber, OTR/L Acute Rehab Services Office: [phone]   Layla Maw 07/03/2021, 1:19 PM

## 2021-07-03 NOTE — Progress Notes (Signed)
Theodore Jenkins had the nasal packing taken out yesterday.  I really appreciate the wonderful help from ENT.  He has a little bit of blood-tinged rhinorrhea from the left nares.  His platelet count 11,000.  I will give him a unit of platelets today.  His hemoglobin is 7.7.  His white cell count is 2.3.  His BUN and creatinine are 23 and 1.15.  He has been afebrile.  His temperature this morning 99.5.  Blood pressure 102/58.  I do think he will probably go home after platelets are given.  He WILL need to have Amicar to be administered.  He will take 1 g 3 times a day.  We will also need the prophylactic antibiotic with the ciprofloxacin, Diflucan, and Famvir.  We will have to await the bone marrow biopsy report.  This 5 would not be until Monday or Tuesday.  We will get him back to the office on Monday.  He typically comes in on Mondays I think for lab work.  On his physical exam, on his head and neck exam, there is some slight blood-tinged nasal secretions from the left nares.  Right nare is unremarkable.  There is no oral lesions.  Lungs are clear.  Cardiac exam regular rate and rhythm.  Abdomen is soft.  I forgot to mention it has not had a really good bowel movement.  He thinks that because he is straining, this increases his pressure and this may cause the nasal bleeding.  He said he got MiraLAX last night.  I will give a dose of lactulose today.  Again, from my point of view, he can probably go home.  He just needs to make sure he gets platelets before discharge.  Lattie Haw, MD  2 Cor 5:7

## 2021-07-03 NOTE — Discharge Summary (Signed)
[facility]  Physician Discharge Summary   Patient ID: Theodore Jenkins MRN: [id number] DOB/AGE: 73-Feb-1947 73 y.o.  Admit date: 06/28/2021 Discharge date: 07/03/2021    PCP: Karlene Einstein, MD  DISCHARGE DIAGNOSES:  Myelodysplastic syndrome Severe thrombocytopenia with active bleeding Epistaxis Acute blood loss anemia Pulmonary edema due to blood transfusions, resolved Gross hematuria, resolved Chronic hyponatremia Chronic kidney disease stage II History of aortic stenosis status post bioprosthetic AVR Essential hypertension Diabetes mellitus type 2 in obese   RECOMMENDATIONS FOR OUTPATIENT FOLLOW UP: Outpatient follow-up with Dr. Marin Olp, hematology   Home Health: None Equipment/Devices: None  CODE STATUS: Full code  DISCHARGE CONDITION: fair  Diet recommendation: As before  INITIAL HISTORY: 75yo w/ a hx of DM2, CAD s/p CABG, AoS s/p AVR, HTH, AAA, OSA, CKD, endocarditis 2021, and severe myelodysplastic syndrome (followed by Dr. [name]) who was admitted to [facility] on transfer from [facility] where he presented on 7/16 with severe epistaxis. He was found to have a plt count of 7. Hematology/Oncology was consulted at the OSH, and he has been transfused a total of 5 phoresis units of plts. ENT intervened w/ a rhino rocket w/ resolved epistaxis. On 7/17 he developed gross hematuria. Urology was consulted and felt it was simply a consequence of his severe thrombocytopenia. The patient himself desired tx to [facility] to be seen by Dr. Marin Olp, who agreed.    Consultations: Dr. Fredric Dine with ENT Dr. Marin Olp with hematology  Procedures: Nasal packing Multiple PRBC and platelet transfusion   HOSPITAL COURSE:   Myelodysplastic syndrome/severe thrombocytopenia with active bleeding Platelet count at time of presentation to [facility] was 7.  Hematology was consulted.  Patient received multiple platelet transfusions in the hospital.  Patient also  underwent bone marrow biopsy which will be followed by hematology in the outpatient setting.  Patient was also started on aminocaproic acid and will be discharged on the same.  Transfuse more platelets today prior to discharge.   Pulmonary edema due to multiple transfusions Patient received furosemide with good diuresis and improvement    Acute blood loss anemia on chronic anemia of MDS Acute blood loss anemia most likely secondary to combination of epistaxis as well as hematuria.  This is all in the setting of myelodysplastic syndrome.  Required PRBC transfusions with improvement in hemoglobin.  Bleeding is also subsided.     Epistaxis Patient was seen by ENT at [facility] hospital prior to transfer.  Left nasal packing was placed.  ENT was consulted here.  After 5 days the nasal packing was deflated and subsequently removed by ENT.  No further bleeding noted.     Gross hematuria Appears to have ceased.   Hyponatremia -chronic Chronic for the most part.  Stable.     AKI on CKD stage II Baseline creatinine reportedly 1.2 -creatinine 1.7 at time of presentation to [facility].  Improved.   CAD s/p CABG Asymtomatic presently   History of aortic stenosis s/p Bioprosthetic AVR Stable   Essential hypertension Blood pressure is reasonably well controlled.  Noted to be on metoprolol.   DM2 CBG reasonably controlled at present   Obesity Estimated body mass index is 30.44 kg/m as calculated from the following:   Height as of this encounter: 5' 9"  (1.753 m).   Weight as of this encounter: 93.5 kg.  Overall stable.  Okay for discharge home today.   PERTINENT LABS:  The results of significant diagnostics from this hospitalization (including imaging, microbiology, ancillary and laboratory)  are listed below for reference.      Labs:  COVID-19 Labs   Lab Results  Component Value Date   [place] NEGATIVE 12/10/2020   [place] NEGATIVE 04/29/2020      Basic Metabolic  Panel: Recent Labs  Lab 06/29/21 0510 06/30/21 0526 07/01/21 0600 07/02/21 0630 07/03/21 0512  NA 129* 126* 129* 125* 125*  K 3.7 3.6 3.6 3.6 3.5  CL 101 98 96* 95* 94*  CO2 21* 21* 22 22 23   GLUCOSE 166* 148* 162* 208* 175*  BUN 18 19 19  25* 23  CREATININE 0.90 1.02 0.98 1.06 1.15  CALCIUM 8.1* 7.8* 8.4* 7.8* 8.0*  MG  --   --  1.8  --   --   PHOS  --   --  2.8  --   --    Liver Function Tests: Recent Labs  Lab 06/29/21 0510 06/30/21 0526 07/01/21 0600 07/02/21 0630 07/03/21 0512  AST 22 31 32 88* 62*  ALT 20 34 41 53* 50*  ALKPHOS 42 44 49 62 62  BILITOT 0.8 1.0 0.9 1.3* 1.4*  PROT 6.6 6.1* 6.6 6.3* 6.4*  ALBUMIN 3.2* 2.9* 3.2* 2.9* 3.1*    CBC: Recent Labs  Lab 06/30/21 0526 06/30/21 2101 07/01/21 0600 07/01/21 1955 07/02/21 0508 07/03/21 0512  WBC 1.3* 1.7* 1.7*  --  2.7* 2.3*  NEUTROABS 1.0* 1.3* 1.5*  --  2.1 2.0  HGB 6.4* 6.7* 6.9* 6.1* 5.9* 7.7*  HCT 18.9* 19.1* 20.3* 17.6* 17.0* 22.3*  MCV 84.0 82.7 83.9  --  84.6 84.8  PLT 6* 11* 9*  --  23* 11*      CBG: Recent Labs  Lab 07/02/21 1150 07/02/21 1629 07/02/21 2209 07/03/21 0739 07/03/21 1230  GLUCAP 191* 150* 185* 157* 188*     IMAGING STUDIES DG Chest 1 View  Result Date: 07/01/2021 CLINICAL DATA:  Shortness of breath. History of myelodysplastic syndrome. EXAM: CHEST  1 VIEW COMPARISON:  Most recent radiograph 12/10/2020, CT 12/11/2020 FINDINGS: Right chest port in place. Post median sternotomy with aortic valve replacement. Similar cardiomegaly. Patchy airspace opacity in the right infrahilar lung. Mild interstitial in septal thickening suggestive of pulmonary edema. No pleural fluid or pneumothorax. IMPRESSION: 1. Cardiomegaly with mild interstitial thickening, suspicious for pulmonary edema. 2. Patchy airspace opacity in the right infrahilar lung, may be pneumonia or asymmetric alveolar edema. Electronically Signed   By: Keith Rake M.D.   On: 07/01/2021 19:47   CT  BIOPSY  Result Date: 07/01/2021 INDICATION: 76 year old male with pancytopenia. EXAM: CT GUIDED BONE MARROW ASPIRATION AND CORE BIOPSY MEDICATIONS: None. ANESTHESIA/SEDATION: Moderate (conscious) sedation was employed during this procedure. A total of to milligrams versed and 100 micrograms fentanyl were administered intravenously. The patient's level of consciousness and vital signs were monitored continuously by radiology nursing throughout the procedure under my direct supervision. Total monitored sedation time: 20 minutes FLUOROSCOPY TIME:  CT dose (total DLP): 196 mGy cm. COMPLICATIONS: None immediate. Estimated blood loss: <5 mL PROCEDURE: Informed written consent was obtained from the patient after a thorough discussion of the procedural risks, benefits and alternatives. All questions were addressed. Maximal Sterile Barrier Technique was utilized including caps, mask, sterile gowns, sterile gloves, sterile drape, hand hygiene and skin antiseptic. A timeout was performed prior to the initiation of the procedure. The patient was positioned prone and non-contrast localization CT was performed of the pelvis to demonstrate the iliac marrow spaces. Maximal barrier sterile technique utilized including caps, mask, sterile gowns, sterile gloves, large  sterile drape, hand hygiene, and chlorhexidine prep. Under sterile conditions and local anesthesia, an 11 gauge coaxial bone biopsy needle was advanced into the right iliac marrow space. Needle position was confirmed with CT imaging. Initially, bone marrow aspiration was performed. Next, the 11 gauge outer cannula was utilized to obtain a right iliac bone marrow core biopsy. Needle was removed. Hemostasis was obtained with compression. The patient tolerated the procedure well. Samples were prepared with the cytotechnologist. IMPRESSION: Successful CT-guided bone marrow aspiration and biopsy. Electronically Signed   By: Michaelle Birks MD   On: 07/01/2021 14:16   CT BONE  MARROW BIOPSY & ASPIRATION  Result Date: 07/01/2021 Michaelle Birks, MD     07/01/2021 12:09 PM Vascular and Interventional Radiology Procedure Note Patient: CARMON SAHLI DOB: 03/12/1946 Medical Record Number: [id number] Note Date/Time: 07/01/21 12:08 PM Performing Physician: Michaelle Birks, MD Assistant(s): None Diagnosis: Pancytopenia Procedure: BONE MARROW BIOPSY Anesthesia: Conscious Sedation Complications: None Estimated Blood Loss: Minimal Specimens: Sent for Pathology Findings: Successful CT-guided bone marrow biopsy A total of 2 cores were obtained. Hemostasis of the tract was achieved using Manual Pressure. Plan: Bed rest for 1 hours. See detailed procedure note with images in PACS. The patient tolerated the procedure well without incident or complication and was returned to Floor Bed in stable condition. Michaelle Birks, MD [facility] Clinic: [phone]    DISCHARGE EXAMINATION: Vitals:   07/03/21 0844 07/03/21 0922 07/03/21 1130 07/03/21 1359  BP: 116/63 111/84 111/70 (!) 124/98  Pulse: 96 87 92 96  Resp: (!) 25  16 19   Temp: 98.2 F (36.8 C) 99.1 F (37.3 C) 97.6 F (36.4 C) (!) 97.5 F (36.4 C)  TempSrc: Oral Oral Oral Oral  SpO2: 97% 97% 100% 99%  Weight:      Height:       General appearance: Awake alert.  In no distress Resp: Clear to auscultation bilaterally.  Normal effort Cardio: S1-S2 is normal regular.  No S3-S4.  No rubs murmurs or bruit GI: Abdomen is soft.  Nontender nondistended.  Bowel sounds are present normal.  No masses organomegaly    DISPOSITION: Home  Discharge Instructions     Call MD for:  difficulty breathing, headache or visual disturbances   Complete by: As directed    Call MD for:  extreme fatigue   Complete by: As directed    Call MD for:  persistant dizziness or light-headedness   Complete by: As directed    Call MD for:  persistant nausea and vomiting   Complete by: As directed    Call MD  for:  severe uncontrolled pain   Complete by: As directed    Call MD for:  temperature >100.4   Complete by: As directed    Diet Carb Modified   Complete by: As directed    Discharge instructions   Complete by: As directed    Please take your medications as prescribed.  Seek attention immediately if you notice any further episodes of bleeding from any site.  Follow-up with Dr. Marin Olp on Monday.  You were cared for by a hospitalist during your hospital stay. If you have any questions about your discharge medications or the care you received while you were in the hospital after you are discharged, you can call the unit and asked to speak with the hospitalist on call if the hospitalist that took care of you is not available. Once you are discharged, your primary care physician will handle any  further medical issues. Please note that NO REFILLS for any discharge medications will be authorized once you are discharged, as it is imperative that you return to your primary care physician (or establish a relationship with a primary care physician if you do not have one) for your aftercare needs so that they can reassess your need for medications and monitor your lab values. If you do not have a primary care physician, you can call [phone] for a physician referral.   Increase activity slowly   Complete by: As directed    No wound care   Complete by: As directed           Allergies as of 07/03/2021       Reactions   Oxycodone Nausea And Vomiting        Medication List     STOP taking these medications    lisinopril 5 MG tablet Commonly known as: ZESTRIL   Venclexta 100 MG tablet Generic drug: venetoclax       TAKE these medications    acetaminophen 500 MG tablet Commonly known as: TYLENOL Take 1,000 mg by mouth every 8 (eight) hours as needed for mild pain (or headaches).   aminocaproic acid 500 MG tablet Commonly known as: AMICAR Take 2 tablets (1,000 mg total) by mouth  every 8 (eight) hours.   amoxicillin 500 MG capsule Commonly known as: AMOXIL Take 2,000 mg by mouth See admin instructions. Take 2,000 mg by mouth one hour prior to dental appointments   chlorhexidine 0.12 % solution Commonly known as: Peridex Use as directed 15 mLs in the mouth or throat 2 (two) times daily. What changed:  when to take this additional instructions   ciprofloxacin 500 MG tablet Commonly known as: Cipro Take 1 tablet (500 mg total) by mouth 2 (two) times daily.   famciclovir 500 MG tablet Commonly known as: FAMVIR Take 1 tablet (500 mg total) by mouth daily.   fluconazole 100 MG tablet Commonly known as: DIFLUCAN Take 1 tablet (100 mg total) by mouth daily.   fluticasone 50 MCG/ACT nasal spray Commonly known as: FLONASE Place 2 sprays into both nostrils in the morning.   gabapentin 300 MG capsule Commonly known as: NEURONTIN Take 600 mg by mouth at bedtime.   lidocaine-prilocaine cream Commonly known as: EMLA Apply to affected area once What changed:  how much to take how to take this when to take this reasons to take this additional instructions   loratadine 10 MG tablet Commonly known as: CLARITIN Take 10 mg by mouth every morning.   LORazepam 0.5 MG tablet Commonly known as: Ativan Take 1 tablet (0.5 mg total) by mouth every 6 (six) hours as needed (Nausea or vomiting).   metoprolol succinate 50 MG 24 hr tablet Commonly known as: TOPROL-XL Take 50 mg by mouth at bedtime.   multivitamin with minerals Tabs tablet Take 1 tablet by mouth every morning.   mupirocin ointment 2 % Commonly known as: BACTROBAN Place into the nose 2 (two) times daily.   nitroGLYCERIN 0.4 MG SL tablet Commonly known as: NITROSTAT Place 1 tablet (0.4 mg total) under the tongue every 5 (five) minutes as needed for chest pain.   omeprazole 20 MG capsule Commonly known as: PRILOSEC Take 20 mg by mouth daily at 6 PM.   ondansetron 8 MG tablet Commonly known  as: Zofran Take 1 tablet (8 mg total) by mouth 2 (two) times daily as needed for refractory nausea / vomiting. Start on the third day  after last day of chemotherapy. What changed:  when to take this additional instructions   OneTouch Verio test strip Generic drug: glucose blood 1 each by Other route daily.   oxymetazoline 0.05 % nasal spray Commonly known as: AFRIN Place 1 spray into both nostrils 2 (two) times daily.   polyethylene glycol 17 g packet Commonly known as: MIRALAX / GLYCOLAX Take 17 g by mouth 2 (two) times daily. What changed: when to take this   prochlorperazine 10 MG tablet Commonly known as: COMPAZINE Take 1 tablet (10 mg total) by mouth every 6 (six) hours as needed (Nausea or vomiting).   rosuvastatin 10 MG tablet Commonly known as: CRESTOR Take 10 mg by mouth at bedtime.   senna 8.6 MG Tabs tablet Commonly known as: SENOKOT Take 1 tablet (8.6 mg total) by mouth 2 (two) times daily.   sitaGLIPtin-metformin 50-1000 MG tablet Commonly known as: JANUMET Take 1 tablet by mouth 2 (two) times daily with a meal.   sodium chloride 0.65 % Soln nasal spray Commonly known as: OCEAN Place 2 sprays into both nostrils every 2 (two) hours.           TOTAL DISCHARGE TIME: 35 minutes  Veda Arrellano [employer] on [email or web address]  07/04/2021, 1:17 PM

## 2021-07-03 NOTE — Evaluation (Signed)
Physical Therapy Evaluation Patient Details Name: Theodore Jenkins MRN: [id number] DOB: [birth year] Today's Date: 07/03/2021   History of Present Illness  Theodore Jenkins is a 75 y.o. male presenting with epistasis and hematuria due to thrombocytopenia. PMH: aortic stenosis, CAD, HTN, IHD s/p stent placement in 2005, aortic valve replacement in 2019, Barrett's esophagus, GERD, DM, HLD, OSA on CPAP,  high-grade myelodysplastic syndrome  Clinical Impression  Pt admitted as above and presenting with functional mobility limitations 2* generalized weakness, decreased endurance, and mild ambulatory balance deficits.  Pt should progress to dc home with family assist.    Follow Up Recommendations No PT follow up    Equipment Recommendations  None recommended by PT    Recommendations for Other Services       Precautions / Restrictions Precautions Precautions: Fall Restrictions Weight Bearing Restrictions: No      Mobility  Bed Mobility               General bed mobility comments: up in chair and requests back to same    Transfers Overall transfer level: Modified independent Equipment used: Rolling walker (2 wheeled)                Ambulation/Gait Ambulation/Gait assistance: Min guard;Supervision;Modified independent (Device/Increase time) Gait Distance (Feet): 75 Feet (75' twice with standing rest break between) Assistive device: Rolling walker (2 wheeled) Gait Pattern/deviations: Step-through pattern;Shuffle Gait velocity: decr   General Gait Details: Increased time  Stairs            Wheelchair Mobility    Modified Rankin (Stroke Patients Only)       Balance Overall balance assessment: Needs assistance Sitting-balance support: No upper extremity supported;Feet supported Sitting balance-Leahy Scale: Good     Standing balance support: Bilateral upper extremity supported;During functional activity Standing balance-Leahy Scale: Fair Standing balance  comment: fair static standing, appears more confident with BUE support during mobility                             Pertinent Vitals/Pain Pain Assessment: No/denies pain    Home Living Family/patient expects to be discharged to:: Private residence Living Arrangements: Spouse/significant other Available Help at Discharge: Family;Available 24 hours/day Type of Home: House Home Access: Stairs to enter   [employer] of Steps: 1+1 at front without rails, 4-5 on back with railings Home Layout: Two level;Able to live on main level with bedroom/bathroom;Laundry or work area in [place]: Environmental consultant - 2 wheels;Cane - single point;Shower seat;Walker - 4 wheels;Wheelchair - manual;Grab bars - tub/shower;Grab bars - toilet      Prior Function Level of Independence: Independent with assistive device(s)         Comments: Use of cane vs walker for mobility, Able to complete ADLs without assist. Wife has been completing laundry in basement area recently     Hand Dominance   Dominant Hand: Right    Extremity/Trunk Assessment   Upper Extremity Assessment Upper Extremity Assessment: Overall WFL for tasks assessed    Lower Extremity Assessment Lower Extremity Assessment: Generalized weakness    Cervical / Trunk Assessment Cervical / Trunk Assessment: Normal  Communication   Communication: No difficulties  Cognition Arousal/Alertness: Awake/alert Behavior During Therapy: WFL for tasks assessed/performed Overall Cognitive Status: Within Functional Limits for tasks assessed  General Comments General comments (skin integrity, edema, etc.): Wife present and supportive    Exercises     Assessment/Plan    PT Assessment Patient needs continued PT services  PT Problem List Decreased strength;Decreased activity tolerance;Decreased balance;Decreased mobility;Decreased knowledge of use of DME        PT Treatment Interventions DME instruction;Gait training;Stair training;Functional mobility training;Therapeutic activities;Therapeutic exercise;Patient/family education    PT Goals (Current goals can be found in the Care Plan section)  Acute Rehab PT Goals Patient Stated Goal: go home today    Frequency Min 3X/week   Barriers to discharge        Co-evaluation               AM-PAC PT "6 Clicks" Mobility  Outcome Measure Help needed turning from your back to your side while in a flat bed without using bedrails?: None Help needed moving from lying on your back to sitting on the side of a flat bed without using bedrails?: A Little Help needed moving to and from a bed to a chair (including a wheelchair)?: A Little Help needed standing up from a chair using your arms (e.g., wheelchair or bedside chair)?: A Little Help needed to walk in hospital room?: A Little Help needed climbing 3-5 steps with a railing? : A Little 6 Click Score: 19    End of Session Equipment Utilized During Treatment: Gait belt Activity Tolerance: Patient tolerated treatment well;Patient limited by fatigue Patient left: in chair;with call bell/phone within reach;with family/visitor present Nurse Communication: Mobility status PT Visit Diagnosis: Difficulty in walking, not elsewhere classified (R26.2);Unsteadiness on feet (R26.81)    Time: [id number] PT Time Calculation (min) (ACUTE ONLY): 17 min   Charges:   PT Evaluation $PT Eval Low Complexity: 1 Low          [place] Pager [phone] Office [phone]   Lashawnda Hancox 07/03/2021, 2:10 PM

## 2021-07-05 ENCOUNTER — Other Ambulatory Visit: Payer: Self-pay | Admitting: *Deleted

## 2021-07-05 ENCOUNTER — Telehealth: Payer: Self-pay | Admitting: *Deleted

## 2021-07-05 ENCOUNTER — Other Ambulatory Visit: Payer: Self-pay

## 2021-07-05 ENCOUNTER — Inpatient Hospital Stay: Payer: PPO

## 2021-07-05 ENCOUNTER — Encounter: Payer: Self-pay | Admitting: *Deleted

## 2021-07-05 DIAGNOSIS — B955 Unspecified streptococcus as the cause of diseases classified elsewhere: Secondary | ICD-10-CM | POA: Diagnosis present

## 2021-07-05 DIAGNOSIS — E1122 Type 2 diabetes mellitus with diabetic chronic kidney disease: Secondary | ICD-10-CM | POA: Diagnosis present

## 2021-07-05 DIAGNOSIS — Z9989 Dependence on other enabling machines and devices: Secondary | ICD-10-CM | POA: Diagnosis not present

## 2021-07-05 DIAGNOSIS — I248 Other forms of acute ischemic heart disease: Secondary | ICD-10-CM | POA: Diagnosis not present

## 2021-07-05 DIAGNOSIS — D696 Thrombocytopenia, unspecified: Secondary | ICD-10-CM | POA: Diagnosis not present

## 2021-07-05 DIAGNOSIS — D62 Acute posthemorrhagic anemia: Secondary | ICD-10-CM | POA: Diagnosis present

## 2021-07-05 DIAGNOSIS — R918 Other nonspecific abnormal finding of lung field: Secondary | ICD-10-CM | POA: Diagnosis not present

## 2021-07-05 DIAGNOSIS — E871 Hypo-osmolality and hyponatremia: Secondary | ICD-10-CM | POA: Diagnosis present

## 2021-07-05 DIAGNOSIS — I714 Abdominal aortic aneurysm, without rupture: Secondary | ICD-10-CM | POA: Diagnosis present

## 2021-07-05 DIAGNOSIS — Z66 Do not resuscitate: Secondary | ICD-10-CM | POA: Diagnosis present

## 2021-07-05 DIAGNOSIS — Z953 Presence of xenogenic heart valve: Secondary | ICD-10-CM | POA: Diagnosis not present

## 2021-07-05 DIAGNOSIS — D649 Anemia, unspecified: Secondary | ICD-10-CM

## 2021-07-05 DIAGNOSIS — C946 Myelodysplastic disease, not classified: Secondary | ICD-10-CM | POA: Diagnosis present

## 2021-07-05 DIAGNOSIS — R5383 Other fatigue: Secondary | ICD-10-CM | POA: Diagnosis not present

## 2021-07-05 DIAGNOSIS — I214 Non-ST elevation (NSTEMI) myocardial infarction: Secondary | ICD-10-CM | POA: Diagnosis not present

## 2021-07-05 DIAGNOSIS — D46Z Other myelodysplastic syndromes: Secondary | ICD-10-CM | POA: Diagnosis not present

## 2021-07-05 DIAGNOSIS — K921 Melena: Secondary | ICD-10-CM | POA: Diagnosis not present

## 2021-07-05 DIAGNOSIS — Z95828 Presence of other vascular implants and grafts: Secondary | ICD-10-CM | POA: Diagnosis not present

## 2021-07-05 DIAGNOSIS — R319 Hematuria, unspecified: Secondary | ICD-10-CM | POA: Diagnosis not present

## 2021-07-05 DIAGNOSIS — D61818 Other pancytopenia: Secondary | ICD-10-CM | POA: Diagnosis present

## 2021-07-05 DIAGNOSIS — J90 Pleural effusion, not elsewhere classified: Secondary | ICD-10-CM | POA: Diagnosis not present

## 2021-07-05 DIAGNOSIS — R0789 Other chest pain: Secondary | ICD-10-CM | POA: Diagnosis present

## 2021-07-05 DIAGNOSIS — G4733 Obstructive sleep apnea (adult) (pediatric): Secondary | ICD-10-CM | POA: Diagnosis not present

## 2021-07-05 DIAGNOSIS — Z951 Presence of aortocoronary bypass graft: Secondary | ICD-10-CM | POA: Diagnosis not present

## 2021-07-05 DIAGNOSIS — D6959 Other secondary thrombocytopenia: Secondary | ICD-10-CM | POA: Diagnosis present

## 2021-07-05 DIAGNOSIS — I10 Essential (primary) hypertension: Secondary | ICD-10-CM | POA: Diagnosis not present

## 2021-07-05 DIAGNOSIS — T80211A Bloodstream infection due to central venous catheter, initial encounter: Secondary | ICD-10-CM | POA: Diagnosis present

## 2021-07-05 DIAGNOSIS — N182 Chronic kidney disease, stage 2 (mild): Secondary | ICD-10-CM | POA: Diagnosis present

## 2021-07-05 DIAGNOSIS — R079 Chest pain, unspecified: Secondary | ICD-10-CM | POA: Diagnosis not present

## 2021-07-05 DIAGNOSIS — J810 Acute pulmonary edema: Secondary | ICD-10-CM | POA: Diagnosis present

## 2021-07-05 DIAGNOSIS — D469 Myelodysplastic syndrome, unspecified: Secondary | ICD-10-CM | POA: Diagnosis not present

## 2021-07-05 DIAGNOSIS — N029 Recurrent and persistent hematuria with unspecified morphologic changes: Secondary | ICD-10-CM | POA: Diagnosis present

## 2021-07-05 DIAGNOSIS — I471 Supraventricular tachycardia: Secondary | ICD-10-CM | POA: Diagnosis present

## 2021-07-05 DIAGNOSIS — I129 Hypertensive chronic kidney disease with stage 1 through stage 4 chronic kidney disease, or unspecified chronic kidney disease: Secondary | ICD-10-CM | POA: Diagnosis present

## 2021-07-05 DIAGNOSIS — I21A1 Myocardial infarction type 2: Secondary | ICD-10-CM | POA: Diagnosis present

## 2021-07-05 DIAGNOSIS — R509 Fever, unspecified: Secondary | ICD-10-CM | POA: Diagnosis not present

## 2021-07-05 DIAGNOSIS — Z809 Family history of malignant neoplasm, unspecified: Secondary | ICD-10-CM | POA: Diagnosis not present

## 2021-07-05 DIAGNOSIS — R7881 Bacteremia: Secondary | ICD-10-CM | POA: Diagnosis not present

## 2021-07-05 DIAGNOSIS — J189 Pneumonia, unspecified organism: Secondary | ICD-10-CM | POA: Diagnosis not present

## 2021-07-05 DIAGNOSIS — Z8249 Family history of ischemic heart disease and other diseases of the circulatory system: Secondary | ICD-10-CM | POA: Diagnosis not present

## 2021-07-05 DIAGNOSIS — D638 Anemia in other chronic diseases classified elsewhere: Secondary | ICD-10-CM | POA: Diagnosis present

## 2021-07-05 DIAGNOSIS — E669 Obesity, unspecified: Secondary | ICD-10-CM | POA: Diagnosis present

## 2021-07-05 LAB — PREPARE PLATELET PHERESIS: Unit division: 0

## 2021-07-05 LAB — CBC WITH DIFFERENTIAL (CANCER CENTER ONLY)
Abs Immature Granulocytes: 0.07 10*3/uL (ref 0.00–0.07)
Basophils Absolute: 0 10*3/uL (ref 0.0–0.1)
Basophils Relative: 0 %
Eosinophils Absolute: 0 10*3/uL (ref 0.0–0.5)
Eosinophils Relative: 0 %
HCT: 21.4 % — ABNORMAL LOW (ref 39.0–52.0)
Hemoglobin: 7.4 g/dL — ABNORMAL LOW (ref 13.0–17.0)
Immature Granulocytes: 4 %
Lymphocytes Relative: 9 %
Lymphs Abs: 0.2 10*3/uL — ABNORMAL LOW (ref 0.7–4.0)
MCH: 29.6 pg (ref 26.0–34.0)
MCHC: 34.6 g/dL (ref 30.0–36.0)
MCV: 85.6 fL (ref 80.0–100.0)
Monocytes Absolute: 0.1 10*3/uL (ref 0.1–1.0)
Monocytes Relative: 4 %
Neutro Abs: 1.6 10*3/uL — ABNORMAL LOW (ref 1.7–7.7)
Neutrophils Relative %: 83 %
Platelet Count: 6 10*3/uL — CL (ref 150–400)
RBC: 2.5 MIL/uL — ABNORMAL LOW (ref 4.22–5.81)
RDW: 15.5 % (ref 11.5–15.5)
WBC Count: 1.9 10*3/uL — ABNORMAL LOW (ref 4.0–10.5)
nRBC: 5.9 % — ABNORMAL HIGH (ref 0.0–0.2)

## 2021-07-05 LAB — COMPREHENSIVE METABOLIC PANEL
ALT: 42 U/L (ref 0–44)
AST: 36 U/L (ref 15–41)
Albumin: 3.7 g/dL (ref 3.5–5.0)
Alkaline Phosphatase: 69 U/L (ref 38–126)
Anion gap: 7 (ref 5–15)
BUN: 20 mg/dL (ref 8–23)
CO2: 22 mmol/L (ref 22–32)
Calcium: 9 mg/dL (ref 8.9–10.3)
Chloride: 95 mmol/L — ABNORMAL LOW (ref 98–111)
Creatinine, Ser: 1.1 mg/dL (ref 0.61–1.24)
GFR, Estimated: >60 mL/min (ref >60)
Glucose, Bld: 125 mg/dL — ABNORMAL HIGH (ref 70–99)
Potassium: 3.7 mmol/L (ref 3.5–5.1)
Sodium: 124 mmol/L — ABNORMAL LOW (ref 135–145)
Total Bilirubin: 1 mg/dL (ref 0.3–1.2)
Total Protein: 7.1 g/dL (ref 6.5–8.1)

## 2021-07-05 LAB — BPAM PLATELET PHERESIS
Blood Product Expiration Date: 202207262359
ISSUE DATE / TIME: 202207230852
Unit Type and Rh: 6200

## 2021-07-05 LAB — SAMPLE TO BLOOD BANK

## 2021-07-05 MED ORDER — HEPARIN SOD (PORK) LOCK FLUSH 100 UNIT/ML IV SOLN
500.0000 [IU] | Freq: Every day | INTRAVENOUS | Status: AC | PRN
  Administered 2021-07-05: 500 [IU]
  Filled 2021-07-05: qty 5

## 2021-07-05 MED ORDER — SODIUM CHLORIDE 0.9% IV SOLUTION
250.0000 mL | Freq: Once | INTRAVENOUS | Status: AC
  Administered 2021-07-05: 250 mL via INTRAVENOUS
  Filled 2021-07-05: qty 250

## 2021-07-05 MED ORDER — SODIUM CHLORIDE 0.9% FLUSH
10.0000 mL | INTRAVENOUS | Status: AC | PRN
  Administered 2021-07-05: 10 mL
  Filled 2021-07-05: qty 10

## 2021-07-05 NOTE — Patient Instructions (Signed)
Blood Transfusion, Adult, Care After This sheet gives you information about how to care for yourself after your procedure. Your doctor may also give you more specific instructions. If youhave problems or questions, contact your doctor. What can I expect after the procedure? After the procedure, it is common to have: Bruising and soreness at the IV site. A fever or chills on the day of the procedure. This may be your body's response to the new blood cells received. A headache. Follow these instructions at home: Insertion site care     Follow instructions from your doctor about how to take care of your insertion site. This is where an IV tube was put into your vein. Make sure you: Wash your hands with soap and water before and after you change your bandage (dressing). If you cannot use soap and water, use hand sanitizer. Change your bandage as told by your doctor. Check your insertion site every day for signs of infection. Check for: Redness, swelling, or pain. Bleeding from the site. Warmth. Pus or a bad smell. General instructions Take over-the-counter and prescription medicines only as told by your doctor. Rest as told by your doctor. Go back to your normal activities as told by your doctor. Keep all follow-up visits as told by your doctor. This is important. Contact a doctor if: You have itching or red, swollen areas of skin (hives). You feel worried or nervous (anxious). You feel weak after doing your normal activities. You have redness, swelling, warmth, or pain around the insertion site. You have blood coming from the insertion site, and the blood does not stop with pressure. You have pus or a bad smell coming from the insertion site. Get help right away if: You have signs of a serious reaction. This may be coming from an allergy or the body's defense system (immune system). Signs include: Trouble breathing or shortness of breath. Swelling of the face or feeling warm  (flushed). Fever or chills. Head, chest, or back pain. Dark pee (urine) or blood in the pee. Widespread rash. Fast heartbeat. Feeling dizzy or light-headed. You may receive your blood transfusion in an outpatient setting. If so, youwill be told whom to contact to report any reactions. These symptoms may be an emergency. Do not wait to see if the symptoms will go away. Get medical help right away. Call your local emergency services (911 in the [place] Do not drive yourself to the hospital. Summary Bruising and soreness at the IV site are common. Check your insertion site every day for signs of infection. Rest as told by your doctor. Go back to your normal activities as told by your doctor. Get help right away if you have signs of a serious reaction. This information is not intended to replace advice given to you by your health care provider. Make sure you discuss any questions you have with your healthcare provider. Document Revised: 05/23/2019 Document Reviewed: 05/23/2019 [employer]vier Patient Education  2022 [employer]  

## 2021-07-05 NOTE — Progress Notes (Signed)
Patient took 650 mg of Tylenol and 25 mg of Benadryl this morning at home at 0900.

## 2021-07-05 NOTE — Telephone Encounter (Signed)
Theodore Jenkins in lab called to let us know that patients platelets are 7,000.  Hgb 7.4.  Orders received for platelets today.  Will recheck labs on Thursday

## 2021-07-05 NOTE — Progress Notes (Signed)
cb

## 2021-07-06 LAB — PREPARE PLATELET PHERESIS: Unit division: 0

## 2021-07-06 LAB — BPAM PLATELET PHERESIS
Blood Product Expiration Date: 202207272359
ISSUE DATE / TIME: 202207251132
Unit Type and Rh: 5100

## 2021-07-07 ENCOUNTER — Inpatient Hospital Stay (HOSPITAL_COMMUNITY)
Admission: EM | Admit: 2021-07-07 | Discharge: 2021-07-14 | DRG: 840 | Disposition: A | Discharge status: Home / Self Care | Payer: PPO | Attending: Internal Medicine | Admitting: Internal Medicine

## 2021-07-07 ENCOUNTER — Other Ambulatory Visit (HOSPITAL_BASED_OUTPATIENT_CLINIC_OR_DEPARTMENT_OTHER): Payer: Self-pay

## 2021-07-07 ENCOUNTER — Inpatient Hospital Stay (HOSPITAL_COMMUNITY): Payer: PPO

## 2021-07-07 ENCOUNTER — Other Ambulatory Visit: Payer: Self-pay

## 2021-07-07 ENCOUNTER — Emergency Department (HOSPITAL_COMMUNITY): Payer: PPO

## 2021-07-07 ENCOUNTER — Encounter (HOSPITAL_COMMUNITY): Payer: Self-pay | Admitting: Hematology & Oncology

## 2021-07-07 ENCOUNTER — Encounter (HOSPITAL_COMMUNITY): Payer: Self-pay | Admitting: *Deleted

## 2021-07-07 ENCOUNTER — Telehealth: Payer: Self-pay | Admitting: *Deleted

## 2021-07-07 DIAGNOSIS — Z951 Presence of aortocoronary bypass graft: Secondary | ICD-10-CM | POA: Diagnosis not present

## 2021-07-07 DIAGNOSIS — R7881 Bacteremia: Secondary | ICD-10-CM

## 2021-07-07 DIAGNOSIS — T80211A Bloodstream infection due to central venous catheter, initial encounter: Secondary | ICD-10-CM | POA: Diagnosis present

## 2021-07-07 DIAGNOSIS — I129 Hypertensive chronic kidney disease with stage 1 through stage 4 chronic kidney disease, or unspecified chronic kidney disease: Secondary | ICD-10-CM | POA: Diagnosis not present

## 2021-07-07 DIAGNOSIS — D61818 Other pancytopenia: Secondary | ICD-10-CM | POA: Diagnosis present

## 2021-07-07 DIAGNOSIS — I714 Abdominal aortic aneurysm, without rupture: Secondary | ICD-10-CM | POA: Diagnosis not present

## 2021-07-07 DIAGNOSIS — R195 Other fecal abnormalities: Secondary | ICD-10-CM | POA: Diagnosis present

## 2021-07-07 DIAGNOSIS — Z809 Family history of malignant neoplasm, unspecified: Secondary | ICD-10-CM | POA: Diagnosis not present

## 2021-07-07 DIAGNOSIS — Z8673 Personal history of transient ischemic attack (TIA), and cerebral infarction without residual deficits: Secondary | ICD-10-CM

## 2021-07-07 DIAGNOSIS — C946 Myelodysplastic disease, not classified: Secondary | ICD-10-CM | POA: Diagnosis present

## 2021-07-07 DIAGNOSIS — E669 Obesity, unspecified: Secondary | ICD-10-CM | POA: Diagnosis present

## 2021-07-07 DIAGNOSIS — I447 Left bundle-branch block, unspecified: Secondary | ICD-10-CM | POA: Diagnosis present

## 2021-07-07 DIAGNOSIS — E871 Hypo-osmolality and hyponatremia: Secondary | ICD-10-CM | POA: Diagnosis present

## 2021-07-07 DIAGNOSIS — B955 Unspecified streptococcus as the cause of diseases classified elsewhere: Secondary | ICD-10-CM | POA: Diagnosis present

## 2021-07-07 DIAGNOSIS — I440 Atrioventricular block, first degree: Secondary | ICD-10-CM | POA: Diagnosis present

## 2021-07-07 DIAGNOSIS — R0789 Other chest pain: Secondary | ICD-10-CM

## 2021-07-07 DIAGNOSIS — Z66 Do not resuscitate: Secondary | ICD-10-CM | POA: Diagnosis present

## 2021-07-07 DIAGNOSIS — I214 Non-ST elevation (NSTEMI) myocardial infarction: Secondary | ICD-10-CM

## 2021-07-07 DIAGNOSIS — I2510 Atherosclerotic heart disease of native coronary artery without angina pectoris: Secondary | ICD-10-CM | POA: Diagnosis present

## 2021-07-07 DIAGNOSIS — D649 Anemia, unspecified: Secondary | ICD-10-CM | POA: Diagnosis present

## 2021-07-07 DIAGNOSIS — Z9989 Dependence on other enabling machines and devices: Secondary | ICD-10-CM | POA: Diagnosis not present

## 2021-07-07 DIAGNOSIS — G4733 Obstructive sleep apnea (adult) (pediatric): Secondary | ICD-10-CM

## 2021-07-07 DIAGNOSIS — Z6829 Body mass index (BMI) 29.0-29.9, adult: Secondary | ICD-10-CM

## 2021-07-07 DIAGNOSIS — Z955 Presence of coronary angioplasty implant and graft: Secondary | ICD-10-CM

## 2021-07-07 DIAGNOSIS — I248 Other forms of acute ischemic heart disease: Secondary | ICD-10-CM | POA: Diagnosis not present

## 2021-07-07 DIAGNOSIS — D6959 Other secondary thrombocytopenia: Secondary | ICD-10-CM | POA: Diagnosis not present

## 2021-07-07 DIAGNOSIS — Z953 Presence of xenogenic heart valve: Secondary | ICD-10-CM | POA: Diagnosis not present

## 2021-07-07 DIAGNOSIS — N182 Chronic kidney disease, stage 2 (mild): Secondary | ICD-10-CM | POA: Diagnosis present

## 2021-07-07 DIAGNOSIS — D62 Acute posthemorrhagic anemia: Secondary | ICD-10-CM | POA: Diagnosis present

## 2021-07-07 DIAGNOSIS — I471 Supraventricular tachycardia: Secondary | ICD-10-CM | POA: Diagnosis present

## 2021-07-07 DIAGNOSIS — J810 Acute pulmonary edema: Secondary | ICD-10-CM | POA: Diagnosis present

## 2021-07-07 DIAGNOSIS — Z95828 Presence of other vascular implants and grafts: Secondary | ICD-10-CM | POA: Diagnosis not present

## 2021-07-07 DIAGNOSIS — I21A1 Myocardial infarction type 2: Secondary | ICD-10-CM | POA: Diagnosis present

## 2021-07-07 DIAGNOSIS — K219 Gastro-esophageal reflux disease without esophagitis: Secondary | ICD-10-CM | POA: Diagnosis present

## 2021-07-07 DIAGNOSIS — Z885 Allergy status to narcotic agent status: Secondary | ICD-10-CM

## 2021-07-07 DIAGNOSIS — R509 Fever, unspecified: Secondary | ICD-10-CM | POA: Diagnosis not present

## 2021-07-07 DIAGNOSIS — E8770 Fluid overload, unspecified: Secondary | ICD-10-CM | POA: Diagnosis present

## 2021-07-07 DIAGNOSIS — R7989 Other specified abnormal findings of blood chemistry: Secondary | ICD-10-CM | POA: Diagnosis not present

## 2021-07-07 DIAGNOSIS — D638 Anemia in other chronic diseases classified elsewhere: Secondary | ICD-10-CM | POA: Diagnosis present

## 2021-07-07 DIAGNOSIS — R918 Other nonspecific abnormal finding of lung field: Secondary | ICD-10-CM | POA: Diagnosis not present

## 2021-07-07 DIAGNOSIS — Z8249 Family history of ischemic heart disease and other diseases of the circulatory system: Secondary | ICD-10-CM

## 2021-07-07 DIAGNOSIS — E876 Hypokalemia: Secondary | ICD-10-CM | POA: Diagnosis not present

## 2021-07-07 DIAGNOSIS — D46Z Other myelodysplastic syndromes: Secondary | ICD-10-CM | POA: Diagnosis present

## 2021-07-07 DIAGNOSIS — K921 Melena: Secondary | ICD-10-CM | POA: Diagnosis not present

## 2021-07-07 DIAGNOSIS — R233 Spontaneous ecchymoses: Secondary | ICD-10-CM | POA: Diagnosis present

## 2021-07-07 DIAGNOSIS — I10 Essential (primary) hypertension: Secondary | ICD-10-CM | POA: Diagnosis present

## 2021-07-07 DIAGNOSIS — D696 Thrombocytopenia, unspecified: Secondary | ICD-10-CM | POA: Diagnosis not present

## 2021-07-07 DIAGNOSIS — N029 Recurrent and persistent hematuria with unspecified morphologic changes: Secondary | ICD-10-CM | POA: Diagnosis present

## 2021-07-07 DIAGNOSIS — J90 Pleural effusion, not elsewhere classified: Secondary | ICD-10-CM | POA: Diagnosis not present

## 2021-07-07 DIAGNOSIS — K2270 Barrett's esophagus without dysplasia: Secondary | ICD-10-CM | POA: Diagnosis present

## 2021-07-07 DIAGNOSIS — D469 Myelodysplastic syndrome, unspecified: Secondary | ICD-10-CM

## 2021-07-07 DIAGNOSIS — R040 Epistaxis: Secondary | ICD-10-CM | POA: Diagnosis present

## 2021-07-07 DIAGNOSIS — E1122 Type 2 diabetes mellitus with diabetic chronic kidney disease: Secondary | ICD-10-CM | POA: Diagnosis not present

## 2021-07-07 DIAGNOSIS — Z79899 Other long term (current) drug therapy: Secondary | ICD-10-CM

## 2021-07-07 DIAGNOSIS — R079 Chest pain, unspecified: Secondary | ICD-10-CM | POA: Diagnosis present

## 2021-07-07 DIAGNOSIS — R5383 Other fatigue: Secondary | ICD-10-CM | POA: Diagnosis not present

## 2021-07-07 DIAGNOSIS — R319 Hematuria, unspecified: Secondary | ICD-10-CM | POA: Diagnosis not present

## 2021-07-07 DIAGNOSIS — J189 Pneumonia, unspecified organism: Secondary | ICD-10-CM | POA: Diagnosis not present

## 2021-07-07 DIAGNOSIS — E785 Hyperlipidemia, unspecified: Secondary | ICD-10-CM | POA: Diagnosis present

## 2021-07-07 DIAGNOSIS — Z87891 Personal history of nicotine dependence: Secondary | ICD-10-CM

## 2021-07-07 LAB — COMPREHENSIVE METABOLIC PANEL
ALT: 39 U/L (ref 0–44)
AST: 36 U/L (ref 15–41)
Albumin: 3.1 g/dL — ABNORMAL LOW (ref 3.5–5.0)
Alkaline Phosphatase: 55 U/L (ref 38–126)
Anion gap: 8 (ref 5–15)
BUN: 17 mg/dL (ref 8–23)
CO2: 21 mmol/L — ABNORMAL LOW (ref 22–32)
Calcium: 8.2 mg/dL — ABNORMAL LOW (ref 8.9–10.3)
Chloride: 96 mmol/L — ABNORMAL LOW (ref 98–111)
Creatinine, Ser: 1.03 mg/dL (ref 0.61–1.24)
GFR, Estimated: >60 mL/min (ref >60)
Glucose, Bld: 140 mg/dL — ABNORMAL HIGH (ref 70–99)
Potassium: 3.7 mmol/L (ref 3.5–5.1)
Sodium: 125 mmol/L — ABNORMAL LOW (ref 135–145)
Total Bilirubin: 1.1 mg/dL (ref 0.3–1.2)
Total Protein: 6.8 g/dL (ref 6.5–8.1)

## 2021-07-07 LAB — URINALYSIS, ROUTINE W REFLEX MICROSCOPIC
Bacteria, UA: NONE SEEN
Bilirubin Urine: NEGATIVE
Glucose, UA: NEGATIVE mg/dL
Ketones, ur: NEGATIVE mg/dL
Leukocytes,Ua: NEGATIVE
Nitrite: NEGATIVE
Protein, ur: 30 mg/dL — AB
RBC / HPF: >50 RBC/hpf — ABNORMAL HIGH (ref 0–5)
Specific Gravity, Urine: 1.017 (ref 1.005–1.030)
pH: 6 (ref 5.0–8.0)

## 2021-07-07 LAB — CBC WITH DIFFERENTIAL/PLATELET
Abs Immature Granulocytes: 0 10*3/uL (ref 0.00–0.07)
Basophils Absolute: 0 10*3/uL (ref 0.0–0.1)
Basophils Relative: 0 %
Blasts: 2 %
Eosinophils Absolute: 0 10*3/uL (ref 0.0–0.5)
Eosinophils Relative: 0 %
HCT: 13 % — ABNORMAL LOW (ref 39.0–52.0)
Hemoglobin: 4.3 g/dL — CL (ref 13.0–17.0)
Lymphocytes Relative: 11 %
Lymphs Abs: 0.2 10*3/uL — ABNORMAL LOW (ref 0.7–4.0)
MCH: 29.3 pg (ref 26.0–34.0)
MCHC: 33.1 g/dL (ref 30.0–36.0)
MCV: 88.4 fL (ref 80.0–100.0)
Monocytes Absolute: 0.1 10*3/uL (ref 0.1–1.0)
Monocytes Relative: 5 %
Neutro Abs: 1.1 10*3/uL — ABNORMAL LOW (ref 1.7–7.7)
Neutrophils Relative %: 82 %
Platelets: 6 10*3/uL — CL (ref 150–400)
RBC: 1.47 MIL/uL — ABNORMAL LOW (ref 4.22–5.81)
RDW: 16.4 % — ABNORMAL HIGH (ref 11.5–15.5)
WBC: 1.4 10*3/uL — CL (ref 4.0–10.5)
nRBC: 3.6 % — ABNORMAL HIGH (ref 0.0–0.2)

## 2021-07-07 LAB — TROPONIN I (HIGH SENSITIVITY)
Troponin I (High Sensitivity): 446 ng/L (ref <18)
Troponin I (High Sensitivity): 483 ng/L (ref <18)
Troponin I (High Sensitivity): 486 ng/L (ref <18)
Troponin I (High Sensitivity): 499 ng/L (ref <18)

## 2021-07-07 LAB — BRAIN NATRIURETIC PEPTIDE: B Natriuretic Peptide: 825.3 pg/mL — ABNORMAL HIGH (ref 0.0–100.0)

## 2021-07-07 LAB — GLUCOSE, CAPILLARY: Glucose-Capillary: 141 mg/dL — ABNORMAL HIGH (ref 70–99)

## 2021-07-07 LAB — PREPARE RBC (CROSSMATCH)

## 2021-07-07 LAB — CBG MONITORING, ED: Glucose-Capillary: 134 mg/dL — ABNORMAL HIGH (ref 70–99)

## 2021-07-07 MED ORDER — VANCOMYCIN HCL 2000 MG/400ML IV SOLN
2000.0000 mg | Freq: Once | INTRAVENOUS | Status: AC
  Administered 2021-07-07: 2000 mg via INTRAVENOUS
  Filled 2021-07-07: qty 400

## 2021-07-07 MED ORDER — ACETAMINOPHEN 500 MG PO TABS
1000.0000 mg | ORAL_TABLET | Freq: Three times a day (TID) | ORAL | Status: DC | PRN
  Administered 2021-07-08 – 2021-07-13 (×2): 1000 mg via ORAL
  Filled 2021-07-07 (×2): qty 2

## 2021-07-07 MED ORDER — ONDANSETRON HCL 4 MG/2ML IJ SOLN: 4.0000 mg | Freq: Four times a day (QID) | INTRAMUSCULAR | Status: DC | PRN

## 2021-07-07 MED ORDER — ROSUVASTATIN CALCIUM 10 MG PO TABS
10.0000 mg | ORAL_TABLET | Freq: Every day | ORAL | Status: DC
  Administered 2021-07-07 – 2021-07-08 (×2): 10 mg via ORAL
  Filled 2021-07-07 (×2): qty 1

## 2021-07-07 MED ORDER — NITROGLYCERIN 0.4 MG SL SUBL: 0.4000 mg | SUBLINGUAL_TABLET | SUBLINGUAL | Status: DC | PRN

## 2021-07-07 MED ORDER — SODIUM CHLORIDE 0.9 % IV SOLN
2.0000 g | Freq: Once | INTRAVENOUS | Status: AC
  Administered 2021-07-07: 2 g via INTRAVENOUS
  Filled 2021-07-07: qty 2

## 2021-07-07 MED ORDER — ACETAMINOPHEN 500 MG PO TABS
1000.0000 mg | ORAL_TABLET | Freq: Once | ORAL | Status: AC
  Administered 2021-07-07: 1000 mg via ORAL
  Filled 2021-07-07: qty 2

## 2021-07-07 MED ORDER — SODIUM CHLORIDE 0.9% IV SOLUTION: Freq: Once | INTRAVENOUS | Status: AC

## 2021-07-07 MED ORDER — AMINOCAPROIC ACID 500 MG PO TABS
1000.0000 mg | ORAL_TABLET | Freq: Three times a day (TID) | ORAL | Status: DC
  Administered 2021-07-07 – 2021-07-14 (×21): 1000 mg via ORAL
  Filled 2021-07-07 (×23): qty 2

## 2021-07-07 MED ORDER — FUROSEMIDE 10 MG/ML IJ SOLN
40.0000 mg | Freq: Once | INTRAMUSCULAR | Status: AC
  Administered 2021-07-07: 40 mg via INTRAVENOUS
  Filled 2021-07-07: qty 4

## 2021-07-07 MED ORDER — LORATADINE 10 MG PO TABS
10.0000 mg | ORAL_TABLET | Freq: Every morning | ORAL | Status: DC
  Administered 2021-07-08 – 2021-07-14 (×7): 10 mg via ORAL
  Filled 2021-07-07 (×8): qty 1

## 2021-07-07 MED ORDER — GABAPENTIN 300 MG PO CAPS
600.0000 mg | ORAL_CAPSULE | Freq: Every day | ORAL | Status: DC
  Administered 2021-07-07 – 2021-07-13 (×7): 600 mg via ORAL
  Filled 2021-07-07 (×7): qty 2

## 2021-07-07 MED ORDER — SODIUM CHLORIDE 0.9 % IV SOLN
10.0000 mL/h | Freq: Once | INTRAVENOUS | Status: AC
  Administered 2021-07-07: 10 mL/h via INTRAVENOUS

## 2021-07-07 MED ORDER — FLUCONAZOLE 100 MG PO TABS
100.0000 mg | ORAL_TABLET | Freq: Every day | ORAL | Status: DC
  Administered 2021-07-08 – 2021-07-14 (×7): 100 mg via ORAL
  Filled 2021-07-07 (×8): qty 1

## 2021-07-07 MED ORDER — LORAZEPAM 0.5 MG PO TABS: 0.5000 mg | ORAL_TABLET | Freq: Four times a day (QID) | ORAL | Status: DC | PRN

## 2021-07-07 MED ORDER — FAMCICLOVIR 500 MG PO TABS
500.0000 mg | ORAL_TABLET | Freq: Every day | ORAL | Status: DC
  Administered 2021-07-08 – 2021-07-14 (×7): 500 mg via ORAL
  Filled 2021-07-07 (×7): qty 1

## 2021-07-07 MED ORDER — CHLORHEXIDINE GLUCONATE CLOTH 2 % EX PADS
6.0000 | MEDICATED_PAD | Freq: Every day | CUTANEOUS | Status: DC
  Administered 2021-07-08 – 2021-07-14 (×7): 6 via TOPICAL

## 2021-07-07 MED ORDER — DIPHENHYDRAMINE HCL 25 MG PO CAPS
25.0000 mg | ORAL_CAPSULE | Freq: Once | ORAL | Status: AC
  Administered 2021-07-07: 25 mg via ORAL
  Filled 2021-07-07: qty 1

## 2021-07-07 MED ORDER — METOPROLOL TARTRATE 25 MG PO TABS
12.5000 mg | ORAL_TABLET | Freq: Two times a day (BID) | ORAL | Status: DC
  Administered 2021-07-07 – 2021-07-14 (×14): 12.5 mg via ORAL
  Filled 2021-07-07 (×14): qty 1

## 2021-07-07 MED ORDER — PANTOPRAZOLE SODIUM 40 MG PO TBEC
40.0000 mg | DELAYED_RELEASE_TABLET | Freq: Every day | ORAL | Status: DC
  Administered 2021-07-07 – 2021-07-14 (×8): 40 mg via ORAL
  Filled 2021-07-07 (×8): qty 1

## 2021-07-07 MED ORDER — ADULT MULTIVITAMIN W/MINERALS CH
1.0000 | ORAL_TABLET | Freq: Every morning | ORAL | Status: DC
  Administered 2021-07-08 – 2021-07-14 (×7): 1 via ORAL
  Filled 2021-07-07 (×7): qty 1

## 2021-07-07 MED ORDER — SODIUM CHLORIDE 0.9% FLUSH
10.0000 mL | Freq: Two times a day (BID) | INTRAVENOUS | Status: DC
  Administered 2021-07-08 (×2): 10 mL
  Administered 2021-07-09 – 2021-07-10 (×2): 20 mL
  Administered 2021-07-12: 10 mL

## 2021-07-07 MED ORDER — SODIUM CHLORIDE 0.9% FLUSH
10.0000 mL | INTRAVENOUS | Status: DC | PRN
  Administered 2021-07-14: 10 mL

## 2021-07-07 MED ORDER — FLUTICASONE PROPIONATE 50 MCG/ACT NA SUSP
2.0000 | Freq: Every day | NASAL | Status: DC
  Administered 2021-07-09 – 2021-07-14 (×6): 2 via NASAL
  Filled 2021-07-07: qty 16

## 2021-07-07 MED ORDER — ONDANSETRON HCL 4 MG PO TABS: 4.0000 mg | ORAL_TABLET | Freq: Four times a day (QID) | ORAL | Status: DC | PRN

## 2021-07-07 MED ORDER — INSULIN ASPART 100 UNIT/ML IJ SOLN
0.0000 [IU] | Freq: Three times a day (TID) | INTRAMUSCULAR | Status: DC
  Administered 2021-07-07: 2 [IU] via SUBCUTANEOUS
  Administered 2021-07-08: 3 [IU] via SUBCUTANEOUS
  Administered 2021-07-08 – 2021-07-09 (×2): 2 [IU] via SUBCUTANEOUS
  Administered 2021-07-09: 8 [IU] via SUBCUTANEOUS
  Administered 2021-07-10: 2 [IU] via SUBCUTANEOUS
  Administered 2021-07-10 (×2): 3 [IU] via SUBCUTANEOUS
  Administered 2021-07-11: 5 [IU] via SUBCUTANEOUS
  Administered 2021-07-11: 2 [IU] via SUBCUTANEOUS
  Administered 2021-07-11 – 2021-07-12 (×2): 3 [IU] via SUBCUTANEOUS
  Administered 2021-07-12: 8 [IU] via SUBCUTANEOUS
  Administered 2021-07-12 – 2021-07-13 (×2): 2 [IU] via SUBCUTANEOUS
  Administered 2021-07-13: 8 [IU] via SUBCUTANEOUS
  Administered 2021-07-13: 5 [IU] via SUBCUTANEOUS
  Administered 2021-07-14: 15 [IU] via SUBCUTANEOUS
  Administered 2021-07-14: 2 [IU] via SUBCUTANEOUS

## 2021-07-07 NOTE — Progress Notes (Signed)
A consult was received from an ED physician for vancomycin per pharmacy dosing.  The patient's profile has been reviewed for ht/wt/allergies/indication/available labs.   A one time order has been placed for vancomycin 2 gm & cefepime 2 gm.    Further antibiotics/pharmacy consults should be ordered by admitting physician if indicated.                       Thank you,  Eudelia Bunch, Pharm.D 07/07/2021 1:07 PM

## 2021-07-07 NOTE — H&P (Signed)
History and Physical    Theodore Jenkins [id number] DOB: July 17, 1946 DOA: 07/07/2021  PCP: Lezlie Octave, PA-C   Patient coming from: Home  I have personally briefly reviewed patient's old medical records in [place]  Chief Complaint: Hematuria  HPI: Theodore Jenkins is a 75 y.o. male with medical history significant for myelodysplastic syndrome, history of coronary artery disease status post CABG, diabetes mellitus, hypertension, aortic stenosis status post aortic valve replacement who was brought into the ER by his wife for evaluation of episodes of hematuria, chest pressure, epistaxis as well as bleeding from his gums. Patient was recently discharged from the hospital on 07/23 after he was treated for severe epistaxis related to severe thrombocytopenia. He was transfused 5 packs of platelets during the hospitalization and also received packed RBCs. He complains of chest heaviness but denies having any radiation to his arms, neck or back.  He denies having any nausea, no vomiting, no diaphoresis, no palpitations or shortness of breath. His stools are black in color but he denies having any hematemesis or hematochezia. According to his wife he had a low-grade fever today with a T-max of 100.1. He denies having any dysuria, no frequency, nocturia. He feels fatigued and weak but denies having any abdominal pain, no leg swelling, no headache, no blurred vision, no focal deficits. Labs show sodium 125, potassium 3.7, chloride 96, bicarb 21, glucose 140, BUN 17, creatinine 1.03, creatinine 8.2, alkaline phosphatase 55, albumin 3.1, AST 36, ALT 39, total protein 6.8, BNP 825, troponin 446, white count 9.4, hemoglobin 4.3, hematocrit 13.0, MCV 88.4, RDW 16.4, platelet count 6 Chest x-ray reviewed by me shows right middle lobe opacity, nonspecific and may be right middle lobe pneumonia.  No new cardiopulmonary abnormality. Twelve-lead EKG shows sinus rhythm with first-degree AV block and  left axis deviation    ED Course: Patient is a 75 year old male who presents to the ER for evaluation of hematuria and heaviness in his chest. He has a history of myelodysplastic syndrome and was recently discharged from the hospital after he was admitted for severe epistaxis.  He received multiple units of packed RBC and platelets. He has an underlying history of coronary artery disease and his initial troponin is elevated at 446 with no acute EKG changes. He has severe pancytopenia, hemoglobin today is 4.3g/dl  compared to 7.4 g/dl, 2 days ago. Platelet count is 6000 down from 11,000, 4 days ago. He will be admitted to the hospital for further evaluation.   Review of Systems: As per HPI otherwise all other systems reviewed and negative.    Past Medical History:  Diagnosis Date   Aortic stenosis    Echo (08/2017): Severe AS with mean gradient of 42 mmHg.   Barrett's esophagus    CAD (coronary artery disease) 06/06/2011   Remote stent to the LAD in 2005 // Last cath in 2010 showing diffuse CAD, small PD that is occluded and fills with left to right collaterals, 60-70% 1st OM, with normal EF. Managed medically // Myoview 3/18:  Normal perfusion. LVEF 55% with normal wall motion. This is a low risk study.   Complication of anesthesia    Diabetes mellitus    ED (erectile dysfunction)    Family history of adverse reaction to anesthesia 10/23/2017   sister had history of malignant hyperthermia when she was 108, she is 73 years old now   GERD (gastroesophageal reflux disease)    Goals of care, counseling/discussion 12/22/2020   Hyperlipidemia  Hypertension    IHD (ischemic heart disease)    prior stenting of the LAD in December of 2005; with last cath in 2010 showing diffuse CAD with normal LV function. He is managed medically.    Malignant hyperthermia    sister had reaction concerning for malignant hyperthermia (~ 1988)   MDS (myelodysplastic syndrome), high grade ([place]) 12/22/2020    Neuromuscular disorder ([place]) 10/23/2017   lumbar 4 disc in back causing nerve pain, on gabapentin   Obesity    Obstructive sleep apnea on CPAP    PONV (postoperative nausea and vomiting)    S/P aortic valve replacement with bioprosthetic valve 10/26/2017   25 mm [facility] Ease bovine pericardial tissue valve   S/P coronary artery stent placement 2005   LAD    Past Surgical History:  Procedure Laterality Date   AORTIC VALVE REPLACEMENT N/A 10/26/2017   Procedure: AORTIC VALVE REPLACEMENT (AVR), using Magna Ease 25;  Surgeon: Rexene Alberts, MD;  Location: [place];  Service: Open Heart Surgery;  Laterality: N/A;   CARDIAC CATHETERIZATION  2010   NORMAL LV FUNCTION. DIFFUSE CORONARY DISEASE WITH THE DISTAL PORTION OF THE SMALL POSTERIOR DESCENDING VESSEL OCCLUDED AND FILLING WITH THE RIGHT COLLATERALS, 60-70% STENOSIS IN THE FIRST OBTUSE MARGINAL VESSEL AND DIFFUSE SMALL VESSEL DISEASE   CAROTID ENDARTERECTOMY     CIRCUMCISION  1985   CORONARY ARTERY BYPASS GRAFT N/A 10/26/2017   Procedure: CORONARY ARTERY BYPASS GRAFTING (CABG) x two , using left internal mammary artery and right leg greater saphenous vein harvested endoscopically;  Surgeon: Rexene Alberts, MD;  Location: [place];  Service: Open Heart Surgery;  Laterality: N/A;   CORONARY STENT PLACEMENT  2005   IR IMAGING GUIDED PORT INSERTION  12/28/2020   RIGHT/LEFT HEART CATH AND CORONARY ANGIOGRAPHY N/A 08/25/2017   Procedure: RIGHT/LEFT HEART CATH AND CORONARY ANGIOGRAPHY;  Surgeon: Nelva Bush, MD;  Location: [place] [facility];  Service: Cardiovascular;  Laterality: N/A;   rotator cuff surgery Right    TEE WITHOUT CARDIOVERSION N/A 10/26/2017   Procedure: TRANSESOPHAGEAL ECHOCARDIOGRAM (TEE);  Surgeon: Rexene Alberts, MD;  Location: [place];  Service: Open Heart Surgery;  Laterality: N/A;   TEE WITHOUT CARDIOVERSION N/A 05/01/2020   Procedure: TRANSESOPHAGEAL ECHOCARDIOGRAM (TEE);  Surgeon: Buford Dresser, MD;   Location: [facility] ENDOSCOPY;  Service: Cardiovascular;  Laterality: N/A;     reports that he quit smoking about 28 years ago. His smoking use included cigarettes. He has a 20.00 pack-year smoking history. He has never used smokeless tobacco. He reports current alcohol use of about 1.0 standard drink of alcohol per week. He reports that he does not use drugs.  Allergies  Allergen Reactions   Oxycodone Nausea And Vomiting    Family History  Problem Relation Age of Onset   Cancer Mother    Heart attack Father 51   CAD Brother       Prior to Admission medications   Medication Sig Start Date End Date Taking? Authorizing Provider  acetaminophen (TYLENOL) 500 MG tablet Take 1,000 mg by mouth every 8 (eight) hours as needed for mild pain (or headaches).    [provider]  aminocaproic acid (AMICAR) 500 MG tablet Take 2 tablets (1,000 mg total) by mouth every 8 (eight) hours. 07/03/21 10/01/21  Bonnielee Haff, MD  amoxicillin (AMOXIL) 500 MG capsule Take 2,000 mg by mouth See admin instructions. Take 2,000 mg by mouth one hour prior to dental appointments    [provider]  chlorhexidine (  PERIDEX) 0.12 % solution Use as directed 15 mLs in the mouth or throat 2 (two) times daily. 01/25/21   Volanda Napoleon, MD  ciprofloxacin (CIPRO) 500 MG tablet Take 1 tablet (500 mg total) by mouth 2 (two) times daily. 06/08/21   Volanda Napoleon, MD  famciclovir (FAMVIR) 500 MG tablet Take 1 tablet (500 mg total) by mouth daily. 07/03/21 10/01/21  Bonnielee Haff, MD  fluconazole (DIFLUCAN) 100 MG tablet Take 1 tablet (100 mg total) by mouth daily. 07/03/21 10/01/21  Bonnielee Haff, MD  fluticasone (FLONASE) 50 MCG/ACT nasal spray Place 2 sprays into both nostrils in the morning.    [provider]  gabapentin (NEURONTIN) 300 MG capsule Take 600 mg by mouth at bedtime.     [provider]  lidocaine-prilocaine (EMLA) cream Apply to affected area once 01/25/21   Volanda Napoleon, MD   loratadine (CLARITIN) 10 MG tablet Take 10 mg by mouth every morning.    [provider]  LORazepam (ATIVAN) 0.5 MG tablet Take 1 tablet (0.5 mg total) by mouth every 6 (six) hours as needed (Nausea or vomiting). 01/25/21   Volanda Napoleon, MD  metoprolol succinate (TOPROL-XL) 50 MG 24 hr tablet Take 50 mg by mouth at bedtime.    [provider]  Multiple Vitamin (MULTIVITAMIN WITH MINERALS) TABS tablet Take 1 tablet by mouth every morning.    [provider]  mupirocin ointment (BACTROBAN) 2 % Place into the nose 2 (two) times daily. 07/03/21   Bonnielee Haff, MD  nitroGLYCERIN (NITROSTAT) 0.4 MG SL tablet Place 1 tablet (0.4 mg total) under the tongue every 5 (five) minutes as needed for chest pain. 04/19/19 06/28/21  Burnell Blanks, MD  omeprazole (PRILOSEC) 20 MG capsule Take 20 mg by mouth daily at 6 PM.    [provider]  ondansetron (ZOFRAN) 8 MG tablet Take 1 tablet (8 mg total) by mouth 2 (two) times daily as needed for refractory nausea / vomiting. Start on the third day after last day of chemotherapy. 01/25/21   Volanda Napoleon, MD  [facility] VERIO test strip 1 each by Other route daily. 12/29/17   [provider]  oxymetazoline (AFRIN) 0.05 % nasal spray Place 1 spray into both nostrils 2 (two) times daily. 07/03/21   Bonnielee Haff, MD  polyethylene glycol (MIRALAX / GLYCOLAX) 17 g packet Take 17 g by mouth 2 (two) times daily. 07/03/21   Bonnielee Haff, MD  prochlorperazine (COMPAZINE) 10 MG tablet Take 1 tablet (10 mg total) by mouth every 6 (six) hours as needed (Nausea or vomiting). 01/25/21   Volanda Napoleon, MD  rosuvastatin (CRESTOR) 10 MG tablet Take 10 mg by mouth at bedtime.    [provider]  senna (SENOKOT) 8.6 MG TABS tablet Take 1 tablet (8.6 mg total) by mouth 2 (two) times daily. 07/03/21   Bonnielee Haff, MD  sitaGLIPtin-metformin (JANUMET) 50-1000 MG tablet Take 1 tablet by mouth 2 (two) times daily with a  meal.  12/19/16   [provider]  sodium chloride (OCEAN) 0.65 % SOLN nasal spray Place 2 sprays into both nostrils every 2 (two) hours. 07/03/21   Bonnielee Haff, MD    Physical Exam: Vitals:   07/07/21 1200 07/07/21 1434 07/07/21 1438 07/07/21 1450  BP: 115/61 126/65 126/65 118/63  Pulse: 87 94 94 92  Resp: '18 16 16 20  '$ Temp:  98.2 F (36.8 C) 98.2 F (36.8 C) 98.7 F (37.1 C)  TempSrc:  Oral  Oral Oral  SpO2: 99%  96% 96%     Vitals:   07/07/21 1200 07/07/21 1434 07/07/21 1438 07/07/21 1450  BP: 115/61 126/65 126/65 118/63  Pulse: 87 94 94 92  Resp: '18 16 16 20  '$ Temp:  98.2 F (36.8 C) 98.2 F (36.8 C) 98.7 F (37.1 C)  TempSrc:  Oral Oral Oral  SpO2: 99%  96% 96%      Constitutional: Alert and oriented x 3 . Not in any apparent distress.  Generalized pallor.  Acutely ill-appearing. HEENT:      Head: Normocephalic and atraumatic.         Eyes: PERLA, EOMI, Conjunctivae pallor Sclera is non-icteric.       Mouth/Throat: Mucous membranes are moist.       Neck: Supple with no signs of meningismus. Cardiovascular: Regular rate and rhythm. No murmurs, gallops, or rubs. 2+ symmetrical distal pulses are present . No JVD. No LE edema Respiratory: Respiratory effort normal .Lungs sounds clear bilaterally. No wheezes, crackles, or rhonchi.  Gastrointestinal: Soft, non tender, and non distended with positive bowel sounds.  Genitourinary: No CVA tenderness. Musculoskeletal: Nontender with normal range of motion in all extremities. No cyanosis, or erythema of extremities. Neurologic:  Face is symmetric. Moving all extremities. No gross focal neurologic deficits . Skin: Skin is warm, dry.  Ecchymosis on both forearms, petechial rash in the lower extremities Psychiatric: Mood and affect are normal    Labs on Admission: I have personally reviewed following labs and imaging studies  CBC: Recent Labs  Lab 07/01/21 0600 07/01/21 1955 07/02/21 0508 07/03/21 0512  07/05/21 1030 07/07/21 1106  WBC 1.7*  --  2.7* 2.3* 1.9* 1.4*  NEUTROABS 1.5*  --  2.1 2.0 1.6* 1.1*  HGB 6.9* 6.1* 5.9* 7.7* 7.4* 4.3*  HCT 20.3* 17.6* 17.0* 22.3* 21.4* 13.0*  MCV 83.9  --  84.6 84.8 85.6 88.4  PLT 9*  --  23* 11* 6* 6*   Basic Metabolic Panel: Recent Labs  Lab 07/01/21 0600 07/02/21 0630 07/03/21 0512 07/05/21 1030 07/07/21 1106  NA 129* 125* 125* 124* 125*  K 3.6 3.6 3.5 3.7 3.7  CL 96* 95* 94* 95* 96*  CO2 '22 22 23 22 '$ 21*  GLUCOSE 162* 208* 175* 125* 140*  BUN 19 25* '23 20 17  '$ CREATININE 0.98 1.06 1.15 1.10 1.03  CALCIUM 8.4* 7.8* 8.0* 9.0 8.2*  MG 1.8  --   --   --   --   PHOS 2.8  --   --   --   --    GFR: Estimated Creatinine Clearance: 73.2 mL/min (by C-G formula based on SCr of 1.03 mg/dL). Liver Function Tests: Recent Labs  Lab 07/01/21 0600 07/02/21 0630 07/03/21 0512 07/05/21 1030 07/07/21 1106  AST 32 88* 62* 36 36  ALT 41 53* 50* 42 39  ALKPHOS 49 62 62 69 55  BILITOT 0.9 1.3* 1.4* 1.0 1.1  PROT 6.6 6.3* 6.4* 7.1 6.8  ALBUMIN 3.2* 2.9* 3.1* 3.7 3.1*   No results for input(s): LIPASE, AMYLASE in the last 168 hours. No results for input(s): AMMONIA in the last 168 hours. Coagulation Profile: No results for input(s): INR, PROTIME in the last 168 hours. Cardiac Enzymes: No results for input(s): CKTOTAL, CKMB, CKMBINDEX, TROPONINI in the last 168 hours. BNP (last 3 results) Recent Labs    12/09/20 1629  PROBNP 415*   HbA1C: No results for input(s): HGBA1C in the last 72 hours. CBG: Recent Labs  Lab 07/02/21  1150 07/02/21 1629 07/02/21 2209 07/03/21 0739 07/03/21 1230  GLUCAP 191* 150* 185* 157* 188*   Lipid Profile: No results for input(s): CHOL, HDL, LDLCALC, TRIG, CHOLHDL, LDLDIRECT in the last 72 hours. Thyroid Function Tests: No results for input(s): TSH, T4TOTAL, FREET4, T3FREE, THYROIDAB in the last 72 hours. Anemia Panel: No results for input(s): VITAMINB12, FOLATE, FERRITIN, TIBC, IRON, RETICCTPCT in the  last 72 hours. Urine analysis:    Component Value Date/Time   COLORURINE STRAW (A) 12/10/2020 1743   APPEARANCEUR CLEAR 12/10/2020 1743   LABSPEC 1.005 12/10/2020 1743   PHURINE 6.0 12/10/2020 1743   GLUCOSEU NEGATIVE 12/10/2020 1743   HGBUR NEGATIVE 12/10/2020 1743   BILIRUBINUR NEGATIVE 12/10/2020 1743   KETONESUR NEGATIVE 12/10/2020 1743   PROTEINUR NEGATIVE 12/10/2020 1743   NITRITE NEGATIVE 12/10/2020 1743   LEUKOCYTESUR NEGATIVE 12/10/2020 1743    Radiological Exams on Admission: DG Chest 2 View  Result Date: 07/07/2021 CLINICAL DATA:  75 year old male with chest pressure and fatigue for 1 week. Pancytopenia. EXAM: CHEST - 2 VIEW COMPARISON:  Chest radiographs 07/01/2021 and earlier. FINDINGS: PA and lateral views today. Stable right chest power port. Prior sternotomy and cardiac valve replacement. Mediastinal contours are stable and within normal limits. Regressed pulmonary interstitial opacity from earlier this month. The left lung now appears clear. No pneumothorax or layering pleural fluid. However, there is indistinct right middle lobe opacity on both views, which persists from earlier this month. No acute osseous abnormality identified. Negative visible bowel gas pattern. IMPRESSION: 1. Confluent but indistinct right middle lobe opacity not improved from 6 days ago. This is nonspecific, but consider right middle lobe pneumonia. 2. Otherwise pulmonary interstitial edema appears resolved from earlier this month. No new cardiopulmonary abnormality. Electronically Signed   By: Genevie Ann M.D.   On: 07/07/2021 10:27     Assessment/Plan Principal Problem:   Chest pain Active Problems:   Essential hypertension   S/P aortic valve replacement with bioprosthetic valve    S/P CABG x 2   Hyponatremia   Pancytopenia (H[facility])   MDS (myelodysplastic syndrome), high grade ([facility])   OSA on CPAP   Thrombocytopenia ([facility])   Acute blood loss anemia (ABLA)      Chest pain Concerning for  possible non-ST elevation MI secondary to demand ischemia from severe anemia Patient has a history of coronary artery disease and is status post CABG x 2 Twelve-lead EKG reviewed by me shows an incomplete left bundle branch block with nonspecific ST-T wave changes Will cycle cardiac enzymes Patient is unable to receive aspirin or anticoagulation due to severe anemia requiring blood transfusion Will obtain 2D echocardiogram to rule out regional wall motion abnormality We will transfuse 3 units of packed RBC for symptomatic anemia Continue statins and reduced dose of beta-blocker to 12.5 mg twice daily     Myelodysplastic syndrome with pancytopenia Severe anemia/thrombocytopenia with acute blood loss anemia secondary to hematuria, epistaxis and mucosal bleeding from his gums Patient noted to have a hemoglobin of 4.3g/dl down from 7.4g/dl, 2 days ago He also has platelet count of 6000 We will transfuse 3 units of packed RBC as well as 2 packs of platelets  Will consult hematology/oncology Dr. Marin Olp consulted      Gross hematuria Most likely secondary to severe thrombocytopenia Monitor closely     HTN Continue low-dose beta-blocker Monitor blood pressure closely    DM2 Maintain consistent carbohydrate diet Sliding scale insulin for glycemic control        DVT prophylaxis: SCD  Code Status: DO NOT RESUSCITATE Family Communication: Greater than 50% of time was spent discussing patient's condition and plan of care with him and his wife at the bedside.  All questions and concerns have been addressed.  They verbalized understanding and agree with the plan.  CODE STATUS was discussed and patient is a DO NOT RESUSCITATE Disposition Plan: Back to previous home environment Consults called: Cardiology/oncology Status: At the time of admission, it appears that the appropriate admission status for this patient is inpatient. This is judged to be reasonable and necessary medicine to  ensure the patient's safety given the presenting symptoms, physical exam findings and initial radiographic and laboratory data in the context of their comorbid conditions. Patient requires inpatient status due to high intensity of service, high risk for further deterioration and high frequency of surveillance required.    Collier Bullock MD [facility]     07/07/2021, 3:01 PM

## 2021-07-07 NOTE — ED Triage Notes (Signed)
Pt reports chest pressure since yesterday. No SOB. He reports bleeding from urethra this morning. Also had bleeding from nose and urethra on Sunday, was seen at [facility]. Not on blood thinners.   Hx of bone marrow cancer, has been receiving platelets.

## 2021-07-07 NOTE — ED Provider Notes (Signed)
[facility] DEPT Provider Note   CSN: [id number] Arrival date & time: 07/07/21  [id number]     History Chief Complaint  Patient presents with   Chest Pain   blood from urethra    Theodore Jenkins is a 75 y.o. male.  HPI     75 year old male with history of aortic stenosis, status post aortic valve replacement with bioprosthetic valve coronary artery disease, diabetes, hypertension, hyperlipidemia, ischemic heart disease, OSA, myelodysplastic syndrome, severe thrombocytopenia, CKD stage II, endocarditis 2021 who presents with concern for chest pain and hematuria.  He was recently admitted to the hospital from 7 18-7 23 due to severe epistaxis with platelet count of 7, as well as gross hematuria.  Today, he presents with concern for heaviness of the chest similar to what he had had during his hospitalization, as well as hematuria and bleeding from his urethra.  Reports that last night, he has began to develop a sensation of chest heaviness.  Denies any radiation to the arms, neck or back.  Reports a heaviness directly over his chest, which has been constant.  He denies specific shortness of breath, reports that he has had some shortness of breath related to his recent epistaxis and [employer]'s, however his wife reports that he did seem breathless last night.  No orthopnea or increasing leg swelling.  Reports that the heaviness that he is experiencing feels similar to how he had felt while he was in the hospital related to his pulmonary edema.  Last night, he felt that he could not urinate, however this morning was able to urinate and describes it as a dark yellow color, and that he has had some blood oozing from his urethra unrelated to when he urinates.  He now feels he is able to urinate and empty his bladder does not feel the urge to urinate at this time.  Reports he had a temperature last night of 100.1, but no other fevers.  Denies cough, dysuria, abdominal pain or  back pain.  Has had dark stool for many months.   Past Medical History:  Diagnosis Date   Aortic stenosis    Echo (08/2017): Severe AS with mean gradient of 42 mmHg.   Barrett's esophagus    CAD (coronary artery disease) 06/06/2011   Remote stent to the LAD in 2005 // Last cath in 2010 showing diffuse CAD, small PD that is occluded and fills with left to right collaterals, 60-70% 1st OM, with normal EF. Managed medically // Myoview 3/18:  Normal perfusion. LVEF 55% with normal wall motion. This is a low risk study.   Complication of anesthesia    Diabetes mellitus    ED (erectile dysfunction)    Family history of adverse reaction to anesthesia 10/23/2017   sister had history of malignant hyperthermia when she was 70, she is 73 years old now   GERD (gastroesophageal reflux disease)    Goals of care, counseling/discussion 12/22/2020   Hyperlipidemia    Hypertension    IHD (ischemic heart disease)    prior stenting of the LAD in December of 2005; with last cath in 2010 showing diffuse CAD with normal LV function. He is managed medically.    Malignant hyperthermia    sister had reaction concerning for malignant hyperthermia (~ 1988)   MDS (myelodysplastic syndrome), high grade ([place]) 12/22/2020   Neuromuscular disorder ([place]) 10/23/2017   lumbar 4 disc in back causing nerve pain, on gabapentin   Obesity    Obstructive sleep  apnea on CPAP    PONV (postoperative nausea and vomiting)    S/P aortic valve replacement with bioprosthetic valve 10/26/2017   25 mm [facility] Ease bovine pericardial tissue valve   S/P coronary artery stent placement 2005   LAD    Patient Active Problem List   Diagnosis Date Noted   Chest pain 07/07/2021   Acute blood loss anemia (ABLA) 07/07/2021   Pressure injury of skin 06/29/2021   Thrombocytopenia ([place]) 06/28/2021   Stage 3 chronic kidney disease ([place]) 06/27/2021   MDS (myelodysplastic syndrome), high grade ([place]) 12/22/2020   Goals of care,  counseling/discussion 12/22/2020   Pancytopenia ([facility]) 12/10/2020   AKI (acute kidney injury) ([place]) 12/10/2020   Acute bacterial endocarditis    Hyponatremia 04/30/2020   Bacteremia 04/29/2020   S/P AVR (aortic valve replacement) 02/15/2018   S/P aortic valve replacement with bioprosthetic valve  10/26/2017   S/P CABG x 2 10/26/2017   Type 2 diabetes mellitus with complication, without long-term current use of insulin ([place])    Accelerating angina ([place]) 08/18/2017   OSA on CPAP 01/06/2017   Carotid artery disease, unspecified laterality ([place]) 08/08/2016   Abdominal aortic aneurysm without rupture ([place]) 12/22/2015   Exertional chest pain 10/03/2014   Aortic valve stenosis 05/03/2014   ED (erectile dysfunction) 03/20/2012   Coronary artery disease involving native coronary artery of native heart without angina pectoris 06/06/2011   Hyperlipidemia LDL goal <70 06/06/2011   Essential hypertension 06/06/2011   Obesities, morbid ([place]) 06/06/2011    Past Surgical History:  Procedure Laterality Date   AORTIC VALVE REPLACEMENT N/A 10/26/2017   Procedure: AORTIC VALVE REPLACEMENT (AVR), using Magna Ease 25;  Surgeon: Rexene Alberts, MD;  Location: [facility];  Service: Open Heart Surgery;  Laterality: N/A;   CARDIAC CATHETERIZATION  2010   NORMAL LV FUNCTION. DIFFUSE CORONARY DISEASE WITH THE DISTAL PORTION OF THE SMALL POSTERIOR DESCENDING VESSEL OCCLUDED AND FILLING WITH THE RIGHT COLLATERALS, 60-70% STENOSIS IN THE FIRST OBTUSE MARGINAL VESSEL AND DIFFUSE SMALL VESSEL DISEASE   CAROTID ENDARTERECTOMY     CIRCUMCISION  1985   CORONARY ARTERY BYPASS GRAFT N/A 10/26/2017   Procedure: CORONARY ARTERY BYPASS GRAFTING (CABG) x two , using left internal mammary artery and right leg greater saphenous vein harvested endoscopically;  Surgeon: Rexene Alberts, MD;  Location: [facility];  Service: Open Heart Surgery;  Laterality: N/A;   CORONARY STENT PLACEMENT  2005   IR IMAGING GUIDED PORT INSERTION   12/28/2020   RIGHT/LEFT HEART CATH AND CORONARY ANGIOGRAPHY N/A 08/25/2017   Procedure: RIGHT/LEFT HEART CATH AND CORONARY ANGIOGRAPHY;  Surgeon: Nelva Bush, MD;  Location: [facility];  Service: Cardiovascular;  Laterality: N/A;   rotator cuff surgery Right    TEE WITHOUT CARDIOVERSION N/A 10/26/2017   Procedure: TRANSESOPHAGEAL ECHOCARDIOGRAM (TEE);  Surgeon: Rexene Alberts, MD;  Location: [facility];  Service: Open Heart Surgery;  Laterality: N/A;   TEE WITHOUT CARDIOVERSION N/A 05/01/2020   Procedure: TRANSESOPHAGEAL ECHOCARDIOGRAM (TEE);  Surgeon: Buford Dresser, MD;  [facility] ENDOSCOPY;  Service: Cardiovascular;  Laterality: N/A;       Family History  Problem Relation Age of Onset   Cancer Mother    Heart attack Father 65   CAD Brother     Social History   Tobacco Use   Smoking status: Former    Packs/day: 1.00    Years: 20.00    Pack years: 20.00    Types: Cigarettes    Quit date: 12/12/1992  Years since quitting: 28.5   Smokeless tobacco: Never  Vaping Use   Vaping Use: Never used  Substance Use Topics   Alcohol use: Yes    Alcohol/week: 1.0 standard drink    Types: 1 Cans of beer per week   Drug use: No    Home Medications Prior to Admission medications   Medication Sig Start Date End Date Taking? Authorizing Provider  acetaminophen (TYLENOL) 500 MG tablet Take 1,000 mg by mouth every 8 (eight) hours as needed for mild pain (or headaches).   Yes [provider]  aminocaproic acid (AMICAR) 500 MG tablet Take 2 tablets (1,000 mg total) by mouth every 8 (eight) hours. 07/03/21 10/01/21 Yes Bonnielee Haff, MD  amoxicillin (AMOXIL) 500 MG capsule Take 2,000 mg by mouth See admin instructions. Take 2,000 mg by mouth one hour prior to dental appointments   Yes [provider]  chlorhexidine (PERIDEX) 0.12 % solution Use as directed 15 mLs in the mouth or throat 2 (two) times daily. Patient taking differently: Use as directed 15 mLs  in the mouth or throat at bedtime. 01/25/21  Yes Ennever, Rudell Cobb, MD  ciprofloxacin (CIPRO) 500 MG tablet Take 1 tablet (500 mg total) by mouth 2 (two) times daily. 06/08/21  Yes Volanda Napoleon, MD  famciclovir (FAMVIR) 500 MG tablet Take 1 tablet (500 mg total) by mouth daily. 07/03/21 10/01/21 Yes Bonnielee Haff, MD  fluconazole (DIFLUCAN) 100 MG tablet Take 1 tablet (100 mg total) by mouth daily. 07/03/21 10/01/21 Yes Bonnielee Haff, MD  fluticasone [facility]) 50 MCG/ACT nasal spray Place 2 sprays into both nostrils daily as needed for allergies.   Yes [provider]  gabapentin (NEURONTIN) 300 MG capsule Take 600 mg by mouth at bedtime.    Yes [provider]  lidocaine-prilocaine (EMLA) cream Apply to affected area once Patient taking differently: Apply 1 application topically as needed (access port). Apply to affected area once 01/25/21  Yes Ennever, Rudell Cobb, MD  loratadine (CLARITIN) 10 MG tablet Take 10 mg by mouth every morning.   Yes [provider]  LORazepam (ATIVAN) 0.5 MG tablet Take 1 tablet (0.5 mg total) by mouth every 6 (six) hours as needed (Nausea or vomiting). 01/25/21  Yes Volanda Napoleon, MD  metoprolol succinate (TOPROL-XL) 50 MG 24 hr tablet Take 50 mg by mouth at bedtime.   Yes [provider]  Multiple Vitamin (MULTIVITAMIN WITH MINERALS) TABS tablet Take 1 tablet by mouth every morning.   Yes [provider]  mupirocin ointment (BACTROBAN) 2 % Place into the nose 2 (two) times daily. 07/03/21  Yes Bonnielee Haff, MD  nitroGLYCERIN (NITROSTAT) 0.4 MG SL tablet Place 0.4 mg under the tongue every 5 (five) minutes as needed for chest pain.   Yes [provider]  omeprazole (PRILOSEC) 20 MG capsule Take 20 mg by mouth daily at 6 PM.   Yes [provider]  ondansetron (ZOFRAN) 8 MG tablet Take 1 tablet (8 mg total) by mouth 2 (two) times daily as needed for refractory nausea / vomiting. Start on the third day after  last day of chemotherapy. 01/25/21  Yes Ennever, Rudell Cobb, MD  oxymetazoline (AFRIN) 0.05 % nasal spray Place 1 spray into both nostrils 2 (two) times daily. 07/03/21  Yes Bonnielee Haff, MD  polyethylene glycol (MIRALAX / GLYCOLAX) 17 g packet Take 17 g by mouth 2 (two) times daily. Patient taking differently: Take 17 g by mouth daily as needed for moderate constipation. 07/03/21  Yes Bonnielee Haff, MD  prochlorperazine (COMPAZINE) 10 MG tablet Take 1 tablet (10 mg total) by mouth every 6 (six) hours as needed (Nausea or vomiting). 01/25/21  Yes Volanda Napoleon, MD  rosuvastatin (CRESTOR) 10 MG tablet Take 10 mg by mouth at bedtime.   Yes [provider]  sitaGLIPtin-metformin (JANUMET) 50-1000 MG tablet Take 1 tablet by mouth 2 (two) times daily with a meal.  12/19/16  Yes [provider]  sodium chloride (OCEAN) 0.65 % SOLN nasal spray Place 2 sprays into both nostrils every 2 (two) hours. 07/03/21  Yes Bonnielee Haff, MD  [facility] VERIO test strip 1 each by Other route daily. 12/29/17   [provider]  senna (SENOKOT) 8.6 MG TABS tablet Take 1 tablet (8.6 mg total) by mouth 2 (two) times daily. 07/03/21   Bonnielee Haff, MD    Allergies    Oxycodone  Review of Systems   Review of Systems  Constitutional:  Positive for activity change and fatigue. Negative for fever (100.1).  Respiratory:  Negative for cough and shortness of breath.   Cardiovascular:  Positive for chest pain.  Gastrointestinal:  Negative for abdominal pain, constipation, diarrhea, nausea and vomiting.  Genitourinary:  Positive for difficulty urinating and hematuria.  Musculoskeletal:  Negative for back pain.  Skin:  Negative for rash.  Neurological:  Negative for syncope and headaches.   Physical Exam Updated Vital Signs BP 115/69 (BP Location: Left Arm)   Pulse 85   Temp 98.5 F (36.9 C) (Oral)   Resp 16   SpO2 98%   Physical Exam Vitals and nursing note reviewed.  Constitutional:       General: He is not in acute distress.    Appearance: He is well-developed. He is not diaphoretic.  HENT:     Head: Normocephalic and atraumatic.  Eyes:     Conjunctiva/sclera: Conjunctivae normal.  Cardiovascular:     Rate and Rhythm: Normal rate and regular rhythm.     Heart sounds: Normal heart sounds. No murmur heard.   No friction rub. No gallop.  Pulmonary:     Effort: Pulmonary effort is normal. No respiratory distress.     Breath sounds: Normal breath sounds. No wheezing or rales.  Abdominal:     General: There is no distension.     Palpations: Abdomen is soft.     Tenderness: There is no abdominal tenderness. There is no guarding.  Musculoskeletal:     Cervical back: Normal range of motion.  Skin:    General: Skin is warm and dry.  Neurological:     Mental Status: He is alert and oriented to person, place, and time.    ED Results / Procedures / Treatments   Labs (all labs ordered are listed, but only abnormal results are displayed) Labs Reviewed  CBC WITH DIFFERENTIAL/PLATELET - Abnormal; Notable for the following components:      Result Value   WBC 1.4 (*)    RBC 1.47 (*)    Hemoglobin 4.3 (*)    HCT 13.0 (*)    RDW 16.4 (*)    Platelets 6 (*)    nRBC 3.6 (*)    Neutro Abs 1.1 (*)    Lymphs Abs 0.2 (*)    All other components within normal limits  COMPREHENSIVE METABOLIC PANEL - Abnormal; Notable for the following components:   Sodium 125 (*)    Chloride 96 (*)    CO2 21 (*)    Glucose, Bld 140 (*)  Calcium 8.2 (*)    Albumin 3.1 (*)    All other components within normal limits  BRAIN NATRIURETIC PEPTIDE - Abnormal; Notable for the following components:   B Natriuretic Peptide 825.3 (*)    All other components within normal limits  URINALYSIS, ROUTINE W REFLEX MICROSCOPIC - Abnormal; Notable for the following components:   APPearance HAZY (*)    Hgb urine dipstick LARGE (*)    Protein, ur 30 (*)    RBC / HPF >50 (*)    All other components within  normal limits  CBG MONITORING, ED - Abnormal; Notable for the following components:   Glucose-Capillary 134 (*)    All other components within normal limits  TROPONIN I (HIGH SENSITIVITY) - Abnormal; Notable for the following components:   Troponin I (High Sensitivity) 446 (*)    All other components within normal limits  TROPONIN I (HIGH SENSITIVITY) - Abnormal; Notable for the following components:   Troponin I (High Sensitivity) 483 (*)    All other components within normal limits  CULTURE, BLOOD (ROUTINE X 2)  CULTURE, BLOOD (ROUTINE X 2)  CBC  BASIC METABOLIC PANEL  TYPE AND SCREEN  PREPARE RBC (CROSSMATCH)  PREPARE PLATELET PHERESIS  TROPONIN I (HIGH SENSITIVITY)    EKG EKG Interpretation  Date/Time:  Wednesday July 07 2021 09:35:11 EDT Ventricular Rate:  89 PR Interval:  238 QRS Duration: 112 QT Interval:  416 QTC Calculation: 506 R Axis:   -52 Text Interpretation: Sinus rhythm with 1st degree A-V block Left axis deviation Incomplete left bundle branch block Nonspecific ST and T wave abnormality Prolonged QT Abnormal ECG Since prior ECG, rate has slowed Confirmed by Gareth Morgan [phone]) on 07/07/2021 10:39:42 AM  Radiology DG Chest 2 View  Result Date: 07/07/2021 CLINICAL DATA:  75 year old male with chest pressure and fatigue for 1 week. Pancytopenia. EXAM: CHEST - 2 VIEW COMPARISON:  Chest radiographs 07/01/2021 and earlier. FINDINGS: PA and lateral views today. Stable right chest power port. Prior sternotomy and cardiac valve replacement. Mediastinal contours are stable and within normal limits. Regressed pulmonary interstitial opacity from earlier this month. The left lung now appears clear. No pneumothorax or layering pleural fluid. However, there is indistinct right middle lobe opacity on both views, which persists from earlier this month. No acute osseous abnormality identified. Negative visible bowel gas pattern. IMPRESSION: 1. Confluent but indistinct right  middle lobe opacity not improved from 6 days ago. This is nonspecific, but consider right middle lobe pneumonia. 2. Otherwise pulmonary interstitial edema appears resolved from earlier this month. No new cardiopulmonary abnormality. Electronically Signed   By: Genevie Ann M.D.   On: 07/07/2021 10:27   CT CHEST WO CONTRAST  Result Date: 07/07/2021 CLINICAL DATA:  Pneumonia EXAM: CT CHEST WITHOUT CONTRAST TECHNIQUE: Multidetector CT imaging of the chest was performed following the standard protocol without IV contrast. COMPARISON:  CT chest abdomen pelvis, 12/11/2020 FINDINGS: Cardiovascular: Right chest port catheter. Aortic atherosclerosis. Aortic valve prosthesis. Cardiomegaly. Extensive 3 vessel coronary artery calcifications and/or stents. No pericardial effusion. Mediastinum/Nodes: Newly enlarged right hilar, subcarinal, pretracheal, and high right paratracheal lymph nodes, largest right paratracheal node measuring 1.4 x 1.2 cm (series 2, image 27). Thyroid gland, trachea, and esophagus demonstrate no significant findings. Lungs/Pleura: Small bilateral pleural effusions and associated atelectasis or consolidation. Interlobular septal thickening throughout the lungs. There is heterogeneous airspace opacity and consolidation of the lateral segment right middle lobe (series 4, image 75). There are numerous new bilateral pulmonary nodules of varying sizes,  for example a 1.7 x 1.5 cm nodule of the left apex (series 4, image 20), a 0.7 cm nodule of the right upper lobe (series 4, image 44), and a 1.4 x 1.3 cm nodule of the peripheral left upper lobe (series 4, image 43). Upper Abdomen: No acute abnormality. Musculoskeletal: No chest wall mass or suspicious bone lesions identified. IMPRESSION: 1. There is heterogeneous airspace opacity and consolidation of the lateral segment right middle lobe , consistent with infection or aspiration. 2. There are numerous new bilateral pulmonary nodules of varying sizes, which are  generally nonspecific, possibly infectious or inflammatory in nature although concerning for pulmonary metastatic disease. 3. Newly enlarged mediastinal lymph nodes, which may be reactive to infection, although as above, malignancy is difficult to exclude. 4. Small bilateral pleural effusions and associated atelectasis or consolidation. 5. Interlobular septal thickening throughout the lungs, consistent with pulmonary edema. 6. Cardiomegaly and coronary artery disease. Aortic Atherosclerosis (ICD10-I70.0). Electronically Signed   By: Eddie Candle M.D.   On: 07/07/2021 15:50    Procedures .Critical Care  Date/Time: 07/07/2021 6:06 PM Performed by: Gareth Morgan, MD Authorized by: Gareth Morgan, MD   Critical care provider statement:    Critical care time (minutes):  90   Critical care was time spent personally by me on the following activities:  Discussions with consultants, evaluation of patient's response to treatment, examination of patient, ordering and performing treatments and interventions, ordering and review of laboratory studies, ordering and review of radiographic studies, pulse oximetry, re-evaluation of patient's condition, obtaining history from patient or surrogate and review of old charts   Medications Ordered in ED Medications  0.9 %  sodium chloride infusion (Manually program via Guardrails IV Fluids) (has no administration in time range)  acetaminophen (TYLENOL) tablet 1,000 mg (has no administration in time range)  famciclovir [facility]) tablet 500 mg (has no administration in time range)  fluconazole (DIFLUCAN) tablet 100 mg (has no administration in time range)  nitroGLYCERIN (NITROSTAT) SL tablet 0.4 mg (has no administration in time range)  rosuvastatin (CRESTOR) tablet 10 mg (has no administration in time range)  LORazepam (ATIVAN) tablet 0.5 mg (has no administration in time range)  pantoprazole (PROTONIX) EC tablet 40 mg (has no administration in time range)   aminocaproic acid (AMICAR) tablet 1,000 mg (has no administration in time range)  gabapentin (NEURONTIN) capsule 600 mg (has no administration in time range)  multivitamin with minerals tablet 1 tablet (has no administration in time range)  fluticasone (FLONASE) 50 MCG/ACT nasal spray 2 spray (has no administration in time range)  loratadine (CLARITIN) tablet 10 mg (has no administration in time range)  metoprolol tartrate (LOPRESSOR) tablet 12.5 mg (has no administration in time range)  ondansetron (ZOFRAN) tablet 4 mg (has no administration in time range)    Or  ondansetron (ZOFRAN) injection 4 mg (has no administration in time range)  insulin aspart (novoLOG) injection 0-15 Units (has no administration in time range)  0.9 %  sodium chloride infusion (10 mL/hr Intravenous New Bag/Given 07/07/21 1446)  ceFEPIme (MAXIPIME) 2 g in sodium chloride 0.9 % 100 mL IVPB (0 g Intravenous Stopped 07/07/21 1537)  furosemide (LASIX) injection 40 mg (40 mg Intravenous Given 07/07/21 1451)  vancomycin (VANCOREADY) IVPB 2000 mg/400 mL (2,000 mg Intravenous New Bag/Given 07/07/21 1537)  diphenhydrAMINE (BENADRYL) capsule 25 mg (25 mg Oral Given 07/07/21 1427)  acetaminophen (TYLENOL) tablet 1,000 mg (1,000 mg Oral Given 07/07/21 1427)    ED Course  I have reviewed the triage vital  signs and the nursing notes.  Pertinent labs & imaging results that were available during my care of the patient were reviewed by me and considered in my medical decision making (see chart for details).    MDM Rules/Calculators/A&P                            75 year old male with history of aortic stenosis, status post aortic valve replacement with bioprosthetic valve coronary artery disease, diabetes, hypertension, hyperlipidemia, ischemic heart disease, OSA, myelodysplastic syndrome, severe thrombocytopenia, CKD stage II, endocarditis 2021 who presents with concern for chest pain and hematuria.  Labs significant for  leukopenia and neutropenia, anemia with hemoglobin of 4.3, thrombocytopenia with platelets of 6000.  Discussed risks and benefits of transfusion, and ordered 3 units of PRBCs and 2 units of platelets.  Discussed his pancytopenia with Dr. Marin Olp who will see him in the hospital.  Given concern for possible pneumonia on chest x-ray (despite not having symptoms) temperature elevated at 100.1 at home with inability to complete rectal temp due to lab abnormalities at this time, leukopenia, will empirically cover with vancomycin and cefepime for possible infection.  EKG shows an incomplete left bundle branch block. Troponin 446, concerning for Type II NSTEMI. Given his thrombocytopenia and anemia he is not a candidate for heparin/aspirin. Discussed with Dr. Tamala Julian who reports Cardiology will see him within the next 24 hr.  He is currently without obstructive symptoms, have UA and postvoid pending.     Final Clinical Impression(s) / ED Diagnoses Final diagnoses:  Chest pain, unspecified type  NSTEMI (non-ST elevated myocardial infarction) (HCC)  Anemia, unspecified type  Thrombocytopenia ([place])  Other pancytopenia ([place])    Rx / DC Orders ED Discharge Orders     None        Gareth Morgan, MD 07/07/21 1807

## 2021-07-07 NOTE — Plan of Care (Signed)
  Problem: Education: Goal: Knowledge of General Education information will improve Description Including pain rating scale, medication(s)/side effects and non-pharmacologic comfort measures Outcome: Progressing   Problem: Health Behavior/Discharge Planning: Goal: Ability to manage health-related needs will improve Outcome: Progressing   

## 2021-07-07 NOTE — Plan of Care (Signed)
  Problem: Education: Goal: Knowledge of General Education information will improve Description: Including pain rating scale, medication(s)/side effects and non-pharmacologic comfort measures 07/07/2021 1923 by Zadie Rhine, RN Outcome: Progressing 07/07/2021 1923 by Zadie Rhine, RN Outcome: Progressing   Problem: Health Behavior/Discharge Planning: Goal: Ability to manage health-related needs will improve 07/07/2021 1923 by Zadie Rhine, RN Outcome: Progressing 07/07/2021 1923 by Zadie Rhine, RN Outcome: Progressing   Problem: Clinical Measurements: Goal: Ability to maintain clinical measurements within normal limits will improve Outcome: Progressing

## 2021-07-07 NOTE — ED Notes (Signed)
Post void bladder scan shows 35-45 mL volume.

## 2021-07-07 NOTE — Telephone Encounter (Signed)
Call received from patient's wife stating that pt had a "rough night and is bleeding from his urinary tract this morning."  Dr. Marin Olp notified and order received for pt to go to the [facility] ER now.  Pt.'s wife states that she will take pt to the [facility] ER now.

## 2021-07-07 NOTE — ED Notes (Signed)
Troponin 446. ED Provider and primary nurse notified and aware. Received from lab.

## 2021-07-08 ENCOUNTER — Inpatient Hospital Stay: Payer: PPO

## 2021-07-08 ENCOUNTER — Inpatient Hospital Stay (HOSPITAL_COMMUNITY): Payer: PPO

## 2021-07-08 DIAGNOSIS — R0789 Other chest pain: Secondary | ICD-10-CM | POA: Diagnosis not present

## 2021-07-08 DIAGNOSIS — R509 Fever, unspecified: Secondary | ICD-10-CM

## 2021-07-08 DIAGNOSIS — R079 Chest pain, unspecified: Secondary | ICD-10-CM

## 2021-07-08 DIAGNOSIS — Z9989 Dependence on other enabling machines and devices: Secondary | ICD-10-CM

## 2021-07-08 DIAGNOSIS — R319 Hematuria, unspecified: Secondary | ICD-10-CM | POA: Diagnosis not present

## 2021-07-08 DIAGNOSIS — K921 Melena: Secondary | ICD-10-CM

## 2021-07-08 DIAGNOSIS — Z953 Presence of xenogenic heart valve: Secondary | ICD-10-CM

## 2021-07-08 DIAGNOSIS — I10 Essential (primary) hypertension: Secondary | ICD-10-CM | POA: Diagnosis not present

## 2021-07-08 DIAGNOSIS — G4733 Obstructive sleep apnea (adult) (pediatric): Secondary | ICD-10-CM

## 2021-07-08 DIAGNOSIS — D62 Acute posthemorrhagic anemia: Secondary | ICD-10-CM | POA: Diagnosis not present

## 2021-07-08 DIAGNOSIS — D469 Myelodysplastic syndrome, unspecified: Secondary | ICD-10-CM

## 2021-07-08 DIAGNOSIS — R7881 Bacteremia: Secondary | ICD-10-CM

## 2021-07-08 DIAGNOSIS — D696 Thrombocytopenia, unspecified: Secondary | ICD-10-CM

## 2021-07-08 DIAGNOSIS — Z951 Presence of aortocoronary bypass graft: Secondary | ICD-10-CM

## 2021-07-08 DIAGNOSIS — D61818 Other pancytopenia: Secondary | ICD-10-CM

## 2021-07-08 LAB — BLOOD CULTURE ID PANEL (REFLEXED) - BCID2

## 2021-07-08 LAB — ECHOCARDIOGRAM COMPLETE
AR max vel: 1.79 cm2
AV Area VTI: 1.92 cm2
AV Area mean vel: 1.54 cm2
AV Mean grad: 13 mmHg
AV Peak grad: 19.4 mmHg
Ao pk vel: 2.2 m/s
Area-P 1/2: 3.93 cm2
Height: 69 in
S' Lateral: 3.5 cm
Single Plane A4C EF: 58.2 %
Weight: 3180.8 oz

## 2021-07-08 LAB — CBC WITH DIFFERENTIAL/PLATELET
Abs Immature Granulocytes: 0 10*3/uL (ref 0.00–0.07)
Band Neutrophils: 5 %
Basophils Absolute: 0 10*3/uL (ref 0.0–0.1)
Basophils Relative: 0 %
Eosinophils Absolute: 0 10*3/uL (ref 0.0–0.5)
Eosinophils Relative: 0 %
HCT: 23.9 % — ABNORMAL LOW (ref 39.0–52.0)
Hemoglobin: 8.2 g/dL — ABNORMAL LOW (ref 13.0–17.0)
Lymphocytes Relative: 22 %
Lymphs Abs: 0.2 10*3/uL — ABNORMAL LOW (ref 0.7–4.0)
MCH: 29.7 pg (ref 26.0–34.0)
MCHC: 34.3 g/dL (ref 30.0–36.0)
MCV: 86.6 fL (ref 80.0–100.0)
Monocytes Absolute: 0 10*3/uL — ABNORMAL LOW (ref 0.1–1.0)
Monocytes Relative: 2 %
Neutro Abs: 0.7 10*3/uL — ABNORMAL LOW (ref 1.7–7.7)
Neutrophils Relative %: 71 %
Platelets: 12 10*3/uL — CL (ref 150–400)
RBC: 2.76 MIL/uL — ABNORMAL LOW (ref 4.22–5.81)
RDW: 16.1 % — ABNORMAL HIGH (ref 11.5–15.5)
WBC: 0.9 10*3/uL — CL (ref 4.0–10.5)
nRBC: 7.4 % — ABNORMAL HIGH (ref 0.0–0.2)

## 2021-07-08 LAB — CBC
HCT: 24.7 % — ABNORMAL LOW (ref 39.0–52.0)
Hemoglobin: 8.5 g/dL — ABNORMAL LOW (ref 13.0–17.0)
MCH: 29.2 pg (ref 26.0–34.0)
MCHC: 34.4 g/dL (ref 30.0–36.0)
MCV: 84.9 fL (ref 80.0–100.0)
Platelets: 8 10*3/uL — CL (ref 150–400)
RBC: 2.91 MIL/uL — ABNORMAL LOW (ref 4.22–5.81)
RDW: 15.8 % — ABNORMAL HIGH (ref 11.5–15.5)
WBC: 1.1 10*3/uL — CL (ref 4.0–10.5)
nRBC: 7.5 % — ABNORMAL HIGH (ref 0.0–0.2)

## 2021-07-08 LAB — PROTIME-INR
INR: 1.2 (ref 0.8–1.2)
Prothrombin Time: 15.3 seconds — ABNORMAL HIGH (ref 11.4–15.2)

## 2021-07-08 LAB — COMPREHENSIVE METABOLIC PANEL
ALT: 39 U/L (ref 0–44)
AST: 38 U/L (ref 15–41)
Albumin: 3.4 g/dL — ABNORMAL LOW (ref 3.5–5.0)
Alkaline Phosphatase: 54 U/L (ref 38–126)
Anion gap: 9 (ref 5–15)
BUN: 19 mg/dL (ref 8–23)
CO2: 22 mmol/L (ref 22–32)
Calcium: 8.4 mg/dL — ABNORMAL LOW (ref 8.9–10.3)
Chloride: 93 mmol/L — ABNORMAL LOW (ref 98–111)
Creatinine, Ser: 0.96 mg/dL (ref 0.61–1.24)
GFR, Estimated: >60 mL/min (ref >60)
Glucose, Bld: 195 mg/dL — ABNORMAL HIGH (ref 70–99)
Potassium: 3.4 mmol/L — ABNORMAL LOW (ref 3.5–5.1)
Sodium: 124 mmol/L — ABNORMAL LOW (ref 135–145)
Total Bilirubin: 1 mg/dL (ref 0.3–1.2)
Total Protein: 7 g/dL (ref 6.5–8.1)

## 2021-07-08 LAB — BASIC METABOLIC PANEL
Anion gap: 10 (ref 5–15)
BUN: 18 mg/dL (ref 8–23)
CO2: 21 mmol/L — ABNORMAL LOW (ref 22–32)
Calcium: 8.4 mg/dL — ABNORMAL LOW (ref 8.9–10.3)
Chloride: 96 mmol/L — ABNORMAL LOW (ref 98–111)
Creatinine, Ser: 0.9 mg/dL (ref 0.61–1.24)
GFR, Estimated: >60 mL/min (ref >60)
Glucose, Bld: 146 mg/dL — ABNORMAL HIGH (ref 70–99)
Potassium: 3.5 mmol/L (ref 3.5–5.1)
Sodium: 127 mmol/L — ABNORMAL LOW (ref 135–145)

## 2021-07-08 LAB — GLUCOSE, CAPILLARY
Glucose-Capillary: 142 mg/dL — ABNORMAL HIGH (ref 70–99)
Glucose-Capillary: 182 mg/dL — ABNORMAL HIGH (ref 70–99)
Glucose-Capillary: 108 mg/dL — ABNORMAL HIGH (ref 70–99)
Glucose-Capillary: 131 mg/dL — ABNORMAL HIGH (ref 70–99)

## 2021-07-08 LAB — PHOSPHORUS: Phosphorus: 4 mg/dL (ref 2.5–4.6)

## 2021-07-08 LAB — MAGNESIUM: Magnesium: 1.9 mg/dL (ref 1.7–2.4)

## 2021-07-08 LAB — APTT: aPTT: 35 seconds (ref 24–36)

## 2021-07-08 MED ORDER — SODIUM CHLORIDE 0.9 % IV SOLN
2.0000 g | INTRAVENOUS | Status: DC
  Administered 2021-07-08 – 2021-07-14 (×7): 2 g via INTRAVENOUS
  Filled 2021-07-08: qty 2
  Filled 2021-07-08 (×4): qty 20
  Filled 2021-07-08 (×2): qty 2

## 2021-07-08 MED ORDER — AMINOCAPROIC ACID SOLUTION 5% (50 MG/ML)
10.0000 mL | ORAL | Status: DC
  Administered 2021-07-08 – 2021-07-09 (×10): 10 mL via ORAL
  Filled 2021-07-08: qty 100

## 2021-07-08 MED ORDER — POLYETHYLENE GLYCOL 3350 17 G PO PACK
17.0000 g | PACK | Freq: Every day | ORAL | Status: DC
  Administered 2021-07-08 – 2021-07-14 (×7): 17 g via ORAL
  Filled 2021-07-08 (×7): qty 1

## 2021-07-08 MED ORDER — POTASSIUM CHLORIDE CRYS ER 20 MEQ PO TBCR
40.0000 meq | EXTENDED_RELEASE_TABLET | Freq: Two times a day (BID) | ORAL | Status: AC
  Administered 2021-07-08 – 2021-07-09 (×2): 40 meq via ORAL
  Filled 2021-07-08 (×2): qty 2

## 2021-07-08 MED ORDER — ENSURE MAX PROTEIN PO LIQD
11.0000 [oz_av] | Freq: Every day | ORAL | Status: DC
  Filled 2021-07-08: qty 330

## 2021-07-08 NOTE — Consult Note (Signed)
Referral MD  Reason for Referral: Severe myelodysplasia with hematuria  Chief Complaint  Patient presents with   Chest Pain   blood from urethra  : I was bleeding in the urine.  HPI: [name] is well-known to me.  He is very nice 75 year old white male.  He has high-grade myelodysplasia.  He was recently hospitalized and actually discharged from the hospital past Saturday.  Had a bone marrow biopsy done last week.  Bone marrow report shows that he is to has no real change in the myelodysplasia.  He does have an increase in blasts.  The problem is that he has been on treatment for about 7 months now.  He just has not responded to treatment.  He has been on Vidaza.  We did add venetoclax.  He subsequently began to have hematuria yesterday.  His wife called the office.  He was very weak.  Had Lutathera temperature.  We had to go to the ER.  In the ER, his CBC showed white cell count of 1.4.  Hemoglobin 4.3.  Platelet count 6000.  It is unfortunate that he did receive platelets just 2 days ago.  I do worry that he is developing alloimmunization to platelet transfusions.  He is on Amicar at home but only was taking it once a day instead of 3 times a day.  He has had a decreased appetite.  He has had some melena.  He has had no cough..  A CT scan done when he came in.  This did show some adenopathy in the mediastinum.  He has some pulmonary nodules.  I cannot imagine that he has an underlying malignancy.  If he did, he would not be a candidate for any therapy.  As such, I would not pursue any additional studies on this.  There is no lab work back yet today.  I see that he is a DO NOT RESUSCITATE.  I totally agree with this.  Overall, I would say his performance status is probably ECOG 2.    Past Medical History:  Diagnosis Date   Aortic stenosis    Echo (08/2017): Severe AS with mean gradient of 42 mmHg.   Barrett's esophagus    CAD (coronary artery disease) 06/06/2011   Remote stent  to the LAD in 2005 // Last cath in 2010 showing diffuse CAD, small PD that is occluded and fills with left to right collaterals, 60-70% 1st OM, with normal EF. Managed medically // Myoview 3/18:  Normal perfusion. LVEF 55% with normal wall motion. This is a low risk study.   Complication of anesthesia    Diabetes mellitus    ED (erectile dysfunction)    Family history of adverse reaction to anesthesia 10/23/2017   sister had history of malignant hyperthermia when she was 22, she is 73 years old now   GERD (gastroesophageal reflux disease)    Goals of care, counseling/discussion 12/22/2020   Hyperlipidemia    Hypertension    IHD (ischemic heart disease)    prior stenting of the LAD in December of 2005; with last cath in 2010 showing diffuse CAD with normal LV function. He is managed medically.    Malignant hyperthermia    sister had reaction concerning for malignant hyperthermia (~ 1988)   MDS (myelodysplastic syndrome), high grade ([place]) 12/22/2020   Neuromuscular disorder ([place]) 10/23/2017   lumbar 4 disc in back causing nerve pain, on gabapentin   Obesity    Obstructive sleep apnea on CPAP    PONV (  postoperative nausea and vomiting)    S/P aortic valve replacement with bioprosthetic valve 10/26/2017   25 mm [facility] Ease bovine pericardial tissue valve   S/P coronary artery stent placement 2005   LAD  :   Past Surgical History:  Procedure Laterality Date   AORTIC VALVE REPLACEMENT N/A 10/26/2017   Procedure: AORTIC VALVE REPLACEMENT (AVR), using Magna Ease 25;  Surgeon: Rexene Alberts, MD;  Location: [place];  Service: Open Heart Surgery;  Laterality: N/A;   CARDIAC CATHETERIZATION  2010   NORMAL LV FUNCTION. DIFFUSE CORONARY DISEASE WITH THE DISTAL PORTION OF THE SMALL POSTERIOR DESCENDING VESSEL OCCLUDED AND FILLING WITH THE RIGHT COLLATERALS, 60-70% STENOSIS IN THE FIRST OBTUSE MARGINAL VESSEL AND DIFFUSE SMALL VESSEL DISEASE   CAROTID ENDARTERECTOMY     CIRCUMCISION  1985    CORONARY ARTERY BYPASS GRAFT N/A 10/26/2017   Procedure: CORONARY ARTERY BYPASS GRAFTING (CABG) x two , using left internal mammary artery and right leg greater saphenous vein harvested endoscopically;  Surgeon: Rexene Alberts, MD;  Location: [place];  Service: Open Heart Surgery;  Laterality: N/A;   CORONARY STENT PLACEMENT  2005   IR IMAGING GUIDED PORT INSERTION  12/28/2020   RIGHT/LEFT HEART CATH AND CORONARY ANGIOGRAPHY N/A 08/25/2017   Procedure: RIGHT/LEFT HEART CATH AND CORONARY ANGIOGRAPHY;  Surgeon: Nelva Bush, MD;  Location: [place] CV LAB;  Service: Cardiovascular;  Laterality: N/A;   rotator cuff surgery Right    TEE WITHOUT CARDIOVERSION N/A 10/26/2017   Procedure: TRANSESOPHAGEAL ECHOCARDIOGRAM (TEE);  Surgeon: Rexene Alberts, MD;  Location: [place];  Service: Open Heart Surgery;  Laterality: N/A;   TEE WITHOUT CARDIOVERSION N/A 05/01/2020   Procedure: TRANSESOPHAGEAL ECHOCARDIOGRAM (TEE);  Surgeon: Buford Dresser, MD;  Location: [facility] ENDOSCOPY;  Service: Cardiovascular;  Laterality: N/A;  :   Current Facility-Administered Medications:    acetaminophen (TYLENOL) tablet 1,000 mg, 1,000 mg, Oral, Q8H PRN, Agbata, Tochukwu, MD, 1,000 mg at 07/08/21 0041   aminocaproic acid (AMICAR) oral solution 50 mg/mL (5%), 100 ml, 10 mL, Oral, Q1H, Brennley Curtice, Rudell Cobb, MD   aminocaproic acid (AMICAR) tablet 1,000 mg, 1,000 mg, Oral, Q8H, Agbata, Tochukwu, MD, 1,000 mg at 07/08/21 2482   Chlorhexidine Gluconate Cloth 2 % PADS 6 each, 6 each, Topical, Daily, Agbata, Tochukwu, MD   famciclovir (FAMVIR) tablet 500 mg, 500 mg, Oral, Daily, Agbata, Tochukwu, MD   fluconazole (DIFLUCAN) tablet 100 mg, 100 mg, Oral, Daily, Agbata, Tochukwu, MD   fluticasone (FLONASE) 50 MCG/ACT nasal spray 2 spray, 2 spray, Each Nare, Daily, Agbata, Tochukwu, MD   gabapentin (NEURONTIN) capsule 600 mg, 600 mg, Oral, QHS, Agbata, Tochukwu, MD, 600 mg at 07/07/21 2201   insulin aspart (novoLOG) injection 0-15  Units, 0-15 Units, Subcutaneous, TID WC, Agbata, Tochukwu, MD, 2 Units at 07/07/21 1850   loratadine (CLARITIN) tablet 10 mg, 10 mg, Oral, q morning, Agbata, Tochukwu, MD   LORazepam (ATIVAN) tablet 0.5 mg, 0.5 mg, Oral, Q6H PRN, Agbata, Tochukwu, MD   metoprolol tartrate (LOPRESSOR) tablet 12.5 mg, 12.5 mg, Oral, BID, Agbata, Tochukwu, MD, 12.5 mg at 07/07/21 2202   multivitamin with minerals tablet 1 tablet, 1 tablet, Oral, q morning, Agbata, Tochukwu, MD   nitroGLYCERIN (NITROSTAT) SL tablet 0.4 mg, 0.4 mg, Sublingual, Q5 min PRN, Agbata, Tochukwu, MD   ondansetron (ZOFRAN) tablet 4 mg, 4 mg, Oral, Q6H PRN **OR** ondansetron (ZOFRAN) injection 4 mg, 4 mg, Intravenous, Q6H PRN, Agbata, Tochukwu, MD   pantoprazole (PROTONIX) EC tablet 40 mg, 40 mg, Oral,  Daily, Agbata, Tochukwu, MD, 40 mg at 07/07/21 1851   rosuvastatin (CRESTOR) tablet 10 mg, 10 mg, Oral, QHS, Agbata, Tochukwu, MD, 10 mg at 07/07/21 2201   sodium chloride flush (NS) 0.9 % injection 10-40 mL, 10-40 mL, Intracatheter, Q12H, Agbata, Tochukwu, MD, 10 mL at 07/08/21 0042   sodium chloride flush (NS) 0.9 % injection 10-40 mL, 10-40 mL, Intracatheter, PRN, Agbata, Tochukwu, MD  Facility-Administered Medications Ordered in Other Encounters:    sodium chloride flush (NS) 0.9 % injection 10 mL, 10 mL, Intracatheter, PRN, Volanda Napoleon, MD, 10 mL at 04/14/21 1614:   aminocaproic acid  10 mL Oral Q1H   aminocaproic acid  1,000 mg Oral Q8H   Chlorhexidine Gluconate Cloth  6 each Topical Daily   famciclovir  500 mg Oral Daily   fluconazole  100 mg Oral Daily   fluticasone  2 spray Each Nare Daily   gabapentin  600 mg Oral QHS   insulin aspart  0-15 Units Subcutaneous TID WC   loratadine  10 mg Oral q morning   metoprolol tartrate  12.5 mg Oral BID   multivitamin with minerals  1 tablet Oral q morning   pantoprazole  40 mg Oral Daily   rosuvastatin  10 mg Oral QHS   sodium chloride flush  10-40 mL Intracatheter Q12H   :   Allergies  Allergen Reactions   Oxycodone Nausea And Vomiting  :   Family History  Problem Relation Age of Onset   Cancer Mother    Heart attack Father 72   CAD Brother   :   Social History   Socioeconomic History   Marital status: Married    Spouse name: Not on file   Number of children: 3   Years of education: Not on file   Highest education level: Not on file  Occupational History   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  Tobacco Use   Smoking status: Former    Packs/day: 1.00    Years: 20.00    Pack years: 20.00    Types: Cigarettes    Quit date: 12/12/1992    Years since quitting: 28.5   Smokeless tobacco: Never  Vaping Use   Vaping Use: Never used  Substance and Sexual Activity   Alcohol use: Yes    Alcohol/week: 1.0 standard drink    Types: 1 Cans of beer per week   Drug use: No   Sexual activity: Not Currently  Other Topics Concern   Not on file  Social History Narrative   Not on file   Social Determinants of Health   Financial Resource Strain: Not on file  Food Insecurity: Not on file  Transportation Needs: Not on file  Physical Activity: Not on file  Stress: Not on file  Social Connections: Not on file  Intimate Partner Violence: Not on file  :  Review of Systems  Constitutional:  Positive for fever and malaise/fatigue.  HENT: Negative.    Eyes: Negative.  Negative for photophobia.  Respiratory: Negative.    Cardiovascular:  Positive for chest pain.  Gastrointestinal:  Positive for blood in stool.  Genitourinary:  Positive for hematuria.  Musculoskeletal: Negative.   Skin: Negative.   Neurological:  Positive for weakness.  Endo/Heme/Allergies:  Bruises/bleeds easily.  Psychiatric/Behavioral: Negative.      Exam:   Physical Exam Vitals reviewed.  HENT:     Head: Normocephalic and atraumatic.  Eyes:     Pupils: Pupils are equal, round, and reactive to light.  Cardiovascular:  Rate and Rhythm: Normal rate and  regular rhythm.     Heart sounds: Normal heart sounds.  Pulmonary:     Effort: Pulmonary effort is normal.     Breath sounds: Normal breath sounds.  Abdominal:     General: Bowel sounds are normal.     Palpations: Abdomen is soft.  Musculoskeletal:        General: No tenderness or deformity. Normal range of motion.     Cervical back: Normal range of motion.  Lymphadenopathy:     Cervical: No cervical adenopathy.  Skin:    General: Skin is warm and dry.     Findings: Ecchymosis present. No erythema or rash.  Neurological:     Mental Status: He is alert and oriented to person, place, and time.  Psychiatric:        Behavior: Behavior normal.        Thought Content: Thought content normal.        Judgment: Judgment normal.   Patient Vitals for the past 24 hrs:  BP Temp Temp src Pulse Resp SpO2 Height Weight  07/08/21 0534 112/63 98.8 F (37.1 C) Oral 82 19 94 % -- --  07/08/21 0509 117/62 98.8 F (37.1 C) Oral 85 19 94 % -- --  07/08/21 0322 114/60 98.7 F (37.1 C) Oral 84 19 92 % -- --  07/08/21 0102 112/60 100.2 F (37.9 C) Oral 93 20 95 % -- --  07/08/21 0047 -- -- -- -- 20 -- -- --  07/08/21 0034 115/65 100.2 F (37.9 C) Oral 93 20 91 % -- --  07/07/21 2311 94/77 99.7 F (37.6 C) Oral 94 18 94 % -- --  07/07/21 2033 (!) 124/59 98.8 F (37.1 C) Oral 95 18 98 % -- --  07/07/21 2013 128/62 98.6 F (37 C) Oral 95 20 100 % -- --  07/07/21 1918 -- -- -- -- -- -- 5' 9"  (1.753 m) 198 lb 12.8 oz (90.2 kg)  07/07/21 1715 -- 98.5 F (36.9 C) Oral 85 -- 98 % -- --  07/07/21 1706 115/69 -- Oral 84 16 98 % -- --  07/07/21 1630 117/61 98.5 F (36.9 C) -- 87 18 97 % -- --  07/07/21 1530 (!) 118/59 98.5 F (36.9 C) -- 88 (!) 23 98 % -- --  07/07/21 1500 124/76 98.5 F (36.9 C) -- 95 20 99 % -- --  07/07/21 1450 118/63 98.7 F (37.1 C) Oral 92 20 96 % -- --  07/07/21 1438 126/65 98.2 F (36.8 C) Oral 94 16 96 % -- --  07/07/21 1434 126/65 98.2 F (36.8 C) Oral 94 16 -- -- --   07/07/21 1200 115/61 -- -- 87 18 99 % -- --  07/07/21 1100 123/62 -- -- 92 18 100 % -- --  07/07/21 0928 (!) 111/55 98.9 F (37.2 C) Oral 100 16 100 % -- --      Recent Labs    07/05/21 1030 07/07/21 1106  WBC 1.9* 1.4*  HGB 7.4* 4.3*  HCT 21.4* 13.0*  PLT 6* 6*    Recent Labs    07/05/21 1030 07/07/21 1106  NA 124* 125*  K 3.7 3.7  CL 95* 96*  CO2 22 21*  GLUCOSE 125* 140*  BUN 20 17  CREATININE 1.10 1.03  CALCIUM 9.0 8.2*    Blood smear review: None  Pathology: None    Assessment and Plan: Mr. Jeanbaptiste is a 75 year old white male.  He has high-grade myelodysplasia.  So far, he has had very little response to chemotherapy.  He now comes back in with bleeding.  He might be alloimmunized platelets.  We will have to see what his platelet count is today.  He understands that he is in a very tough situation.  I think the only other option that we would have his decitabine.  He is not a candidate for any high intensity therapy.  Surprisingly, I do not think there is any molecular markers or molecular mutations and we can utilize for treatment.  Again, we will have to see what his labs look like.  I will give him some Amicar mouth rinse.  He does have this large hematoma on the right side of his lower lip.  Unfortunately, myelodysplasia is such a very difficult disease to treat.  I think we are clearly seeing this with Mr. Berwick.  I know that he is a DO NOT RESUSCITATE.  We may have to think about Hospice for him, depending on how things go with this admission.  I know he will get outstanding care from all the staff up on [address].  Lattie Haw, MD  Psalms 46:10

## 2021-07-08 NOTE — Consult Note (Signed)
[facility] for Infectious Disease       Reason for Consult:bacteremia    Referring Physician: Dr. Alfredia Ferguson  Principal Problem:   Chest pain Active Problems:   Essential hypertension   S/P aortic valve replacement with bioprosthetic valve    S/P CABG x 2   Hyponatremia   Pancytopenia (HCC)   MDS (myelodysplastic syndrome), high grade (HCC)   OSA on CPAP   Thrombocytopenia (HCC)   Acute blood loss anemia (ABLA)    aminocaproic acid  10 mL Oral Q1H   aminocaproic acid  1,000 mg Oral Q8H   Chlorhexidine Gluconate Cloth  6 each Topical Daily   famciclovir  500 mg Oral Daily   fluconazole  100 mg Oral Daily   fluticasone  2 spray Each Nare Daily   gabapentin  600 mg Oral QHS   insulin aspart  0-15 Units Subcutaneous TID WC   loratadine  10 mg Oral q morning   metoprolol tartrate  12.5 mg Oral BID   multivitamin with minerals  1 tablet Oral q morning   pantoprazole  40 mg Oral Daily   polyethylene glycol  17 g Oral Daily   Ensure Max Protein  11 oz Oral Daily   rosuvastatin  10 mg Oral QHS   sodium chloride flush  10-40 mL Intracatheter Q12H    Recommendations: Continue ceftriaxone Consider line removal  Assessment: He has Strep bacteremia with a prosthetic aortic valve.  He was previously treated for presumed endocarditis due to thickening of the valve.  It is unclear if he again has endocarditis of the valve.  He is not a good candidate for a TEE with his pancytopenia but no overt concerns on his TTE in regards to the valves.  Will monitor the culture and see if it is the same species.   Also with a port-a-cath in place since January and concern for a line infection.  Depending on prognosis and what the plan is going forward (hospice/palliative efforts), will need to consider port removal.    Antibiotics: ceftriaxone  HPI: Theodore Jenkins is a 75 y.o. male with high-grade myelodysplagia not responsive to treatment who came in with hematuria.  He is has been afebrile  but blood cultures were sent on admission and now is positive for GPC in both sets and BCID with Strep species.  He previously had Strep infantarius in blood cultures about 1 year ago and TEE was concerning for thickening of his prosthetic aortic valve and he was treated with ceftriaxone for 6 weeks and gentamicin for 2 weeks and follow up blood cultures remained negative.  He is pancytopenic.  TTE without any concerns for vegetation but with severe hypokinesis of the septum.     Review of Systems:  Constitutional: negative for fevers and chills Gastrointestinal: negative for diarrhea Integument/breast: negative for rash All other systems reviewed and are negative    Past Medical History:  Diagnosis Date   Aortic stenosis    Echo (08/2017): Severe AS with mean gradient of 42 mmHg.   Barrett's esophagus    CAD (coronary artery disease) 06/06/2011   Remote stent to the LAD in 2005 // Last cath in 2010 showing diffuse CAD, small PD that is occluded and fills with left to right collaterals, 60-70% 1st OM, with normal EF. Managed medically // Myoview 3/18:  Normal perfusion. LVEF 55% with normal wall motion. This is a low risk study.   Complication of anesthesia    Diabetes mellitus  ED (erectile dysfunction)    Family history of adverse reaction to anesthesia 10/23/2017   sister had history of malignant hyperthermia when she was 37, she is 73 years old now   GERD (gastroesophageal reflux disease)    Goals of care, counseling/discussion 12/22/2020   Hyperlipidemia    Hypertension    IHD (ischemic heart disease)    prior stenting of the LAD in December of 2005; with last cath in 2010 showing diffuse CAD with normal LV function. He is managed medically.    Malignant hyperthermia    sister had reaction concerning for malignant hyperthermia (~ 1988)   MDS (myelodysplastic syndrome), high grade ([place]) 12/22/2020   Neuromuscular disorder ([place]) 10/23/2017   lumbar 4 disc in back causing nerve pain,  on gabapentin   Obesity    Obstructive sleep apnea on CPAP    PONV (postoperative nausea and vomiting)    S/P aortic valve replacement with bioprosthetic valve 10/26/2017   25 mm [facility] Ease bovine pericardial tissue valve   S/P coronary artery stent placement 2005   LAD    Social History   Tobacco Use   Smoking status: Former    Packs/day: 1.00    Years: 20.00    Pack years: 20.00    Types: Cigarettes    Quit date: 12/12/1992    Years since quitting: 28.5   Smokeless tobacco: Never  Vaping Use   Vaping Use: Never used  Substance Use Topics   Alcohol use: Yes    Alcohol/week: 1.0 standard drink    Types: 1 Cans of beer per week   Drug use: No    Family History  Problem Relation Age of Onset   Cancer Mother    Heart attack Father 29   CAD Brother     Allergies  Allergen Reactions   Oxycodone Nausea And Vomiting    Physical Exam: Constitutional: in no apparent distress  Vitals:   07/08/21 1006 07/08/21 1230  BP: 110/60 116/66  Pulse: 80 92  Resp: 16 16  Temp: 98.8 F (37.1 C) 98 F (36.7 C)  SpO2: 100%    EYES: anicteric ENMT: no thrush Chest: right chesp port-a-cath, no erythema Cardiovascular: Cor RRR Respiratory: clear; Musculoskeletal: no pedal edema noted Skin: negatives: no rash Neuro: non-focal  Lab Results  Component Value Date   WBC 0.9 (LL) 07/08/2021   HGB 8.2 (L) 07/08/2021   HCT 23.9 (L) 07/08/2021   MCV 86.6 07/08/2021   PLT 12 (LL) 07/08/2021    Lab Results  Component Value Date   CREATININE 0.96 07/08/2021   BUN 19 07/08/2021   NA 124 (L) 07/08/2021   K 3.4 (L) 07/08/2021   CL 93 (L) 07/08/2021   CO2 22 07/08/2021    Lab Results  Component Value Date   ALT 39 07/08/2021   AST 38 07/08/2021   ALKPHOS 54 07/08/2021     Microbiology: Recent Results (from the past 240 hour(s))  Blood culture (routine x 2)     Status: None (Preliminary result)   Collection Time: 07/07/21 12:40 PM   Specimen: BLOOD  Result Value  Ref Range Status   Specimen Description   Final    BLOOD RIGHT ANTECUBITAL Performed at [facility], [place] [address] [place], [place] [address]    Special Requests   Final    BOTTLES DRAWN AEROBIC AND ANAEROBIC Blood Culture adequate volume Performed at [place] [address], [place], [place] [address]    Culture  Setup Time   Final    GRAM POSITIVE COCCI IN CHAINS IN BOTH AEROBIC AND ANAEROBIC BOTTLES CRITICAL VALUE NOTED.  VALUE IS CONSISTENT WITH PREVIOUSLY REPORTED AND CALLED VALUE.    Culture   Final    NO GROWTH < 24 HOURS Performed at [facility], [place] [address] [place], [place] [address]    Report Status PENDING  Incomplete  Blood culture (routine x 2)     Status: None (Preliminary result)   Collection Time: 07/07/21  1:20 PM   Specimen: BLOOD  Result Value Ref Range Status   Specimen Description   Final    BLOOD LEFT ANTECUBITAL Performed at [facility] [address], [place], [place] [address]    Special Requests   Final    BOTTLES DRAWN AEROBIC AND ANAEROBIC Blood Culture results may not be optimal due to an excessive volume of blood received in culture bottles Performed at [facility] [address] [place], [place] [address]    Culture  Setup Time   Final    GRAM POSITIVE COCCI IN CHAINS IN BOTH AEROBIC AND ANAEROBIC BOTTLES CRITICAL RESULT CALLED TO, READ BACK BY AND VERIFIED WITH: PHARMD MARY TUCKER 07/08/21'@10'$ :56 BY TW Performed at [facility], [place] [address], [place], [place] [address]    Culture GRAM POSITIVE COCCI IN CHAINS  Final   Report Status PENDING  Incomplete  Blood Culture ID Panel (Reflexed)     Status: Abnormal   Collection Time: 07/07/21  1:20 PM  Result Value Ref Range Status   Enterococcus faecalis NOT DETECTED NOT DETECTED Final   Enterococcus Faecium NOT DETECTED NOT DETECTED Final   Listeria monocytogenes NOT DETECTED NOT DETECTED  Final   Staphylococcus species NOT DETECTED NOT DETECTED Final   Staphylococcus aureus (BCID) NOT DETECTED NOT DETECTED Final   Staphylococcus epidermidis NOT DETECTED NOT DETECTED Final   Staphylococcus lugdunensis NOT DETECTED NOT DETECTED Final   Streptococcus species DETECTED (A) NOT DETECTED Final    Comment: Not Enterococcus species, Streptococcus agalactiae, Streptococcus pyogenes, or Streptococcus pneumoniae. CRITICAL RESULT CALLED TO, READ BACK BY AND VERIFIED WITH: PHARMD MARY TUCKER 07/08/21'@10'$ :55 BY TW    Streptococcus agalactiae NOT DETECTED NOT DETECTED Final   Streptococcus pneumoniae NOT DETECTED NOT DETECTED Final   Streptococcus pyogenes NOT DETECTED NOT DETECTED Final   A.calcoaceticus-baumannii NOT DETECTED NOT DETECTED Final   Bacteroides fragilis NOT DETECTED NOT DETECTED Final   Enterobacterales NOT DETECTED NOT DETECTED Final   Enterobacter cloacae complex NOT DETECTED NOT DETECTED Final   Escherichia coli NOT DETECTED NOT DETECTED Final   Klebsiella aerogenes NOT DETECTED NOT DETECTED Final   Klebsiella oxytoca NOT DETECTED NOT DETECTED Final   Klebsiella pneumoniae NOT DETECTED NOT DETECTED Final   Proteus species NOT DETECTED NOT DETECTED Final   Salmonella species NOT DETECTED NOT DETECTED Final   Serratia marcescens NOT DETECTED NOT DETECTED Final   Haemophilus influenzae NOT DETECTED NOT DETECTED Final   Neisseria meningitidis NOT DETECTED NOT DETECTED Final   Pseudomonas aeruginosa NOT DETECTED NOT DETECTED Final   Stenotrophomonas maltophilia NOT DETECTED NOT DETECTED Final   Candida albicans NOT DETECTED NOT DETECTED Final   Candida auris NOT DETECTED NOT DETECTED Final   Candida glabrata NOT DETECTED NOT DETECTED Final   Candida krusei NOT DETECTED NOT DETECTED Final   Candida parapsilosis NOT DETECTED NOT DETECTED Final   Candida tropicalis NOT DETECTED NOT DETECTED Final   Cryptococcus neoformans/gattii NOT DETECTED NOT DETECTED Final  Comment:  Performed at [facility], [place] [address], [place], [place] [address]    Tyneshia Stivers W [place] Coker, [place] for Infectious Disease [facility] [email or web address] 07/08/2021, 3:35 PM

## 2021-07-08 NOTE — Progress Notes (Signed)
  Echocardiogram 2D Echocardiogram has been performed.  Theodore Jenkins 07/08/2021, 1:30 PM

## 2021-07-08 NOTE — TOC Progression Note (Signed)
Transition of Care [facility]) - Progression Note    Patient Details  Name: Theodore Jenkins MRN: [id number] Date of Birth: Jun 24, 1946  [facility]) CM/SW Contact  Purcell Mouton, RN Phone Number: 07/08/2021, 9:20 AM  Clinical Narrative:    Pt from home with spouse. TOC team will continue to follow for discharge needs.   Expected Discharge Plan: Home/Self Care Barriers to Discharge: No Barriers Identified  Expected Discharge Plan and Services Expected Discharge Plan: Home/Self Care       Living arrangements for the past 2 months: Single Family Home                                       Social Determinants of Health (SDOH) Interventions    Readmission Risk Interventions Readmission Risk Prevention Plan 04/30/2020  Post Dischage Appt Complete  Transportation Screening Complete  Some recent data might be hidden

## 2021-07-08 NOTE — Progress Notes (Signed)
PROGRESS NOTE    Theodore Jenkins  [id number] DOB: 1946/11/17 DOA: 07/07/2021 PCP: Lezlie Octave, PA-C   Brief Narrative:  The patient is a 75 year old Caucasian overweight male with a past medical history significant for but not limited to myelodysplastic syndrome, history of CAD status post CABG, history of diabetes mellitus type 2, hypertension, history of aortic stenosis status post aortic valve replacement as well as a history of Streptococcus bacteremia with presumed endocarditis last year of the prosthetic valve who presented to the ED for episodes of hematuria, chest pressure, epistaxis as well as bleeding from his gums.  He was recently discharged from the hospital on 07/03/2021 after he was treated for severe epistaxis related to severe thrombocytopenia.  At that time he transfused 5 packs of platelets during that hospitalization received packed RBCs.  Subsequently started developing chest heaviness denied any radiation to his arms or neck.  He denied any nausea or vomiting no diaphoresis.  His stools are black in color but denies any hematemesis or hematochezia.  He was admitted for the evaluation of his hematuria and heaviness in his chest and oncology and cardiology were also consulted.  He has a history of severe myelodysplastic syndrome and recently discharged for epistaxis.  During his hospitalization he was found to have a hemoglobin of 4.3 and a platelet count down to 6000 so he was transfused 3 units of PRBCs and 2 packs of platelets.  Medical oncology evaluated and feel he has had very little response to chemotherapy and they recommended giving the patient some Amicar mouth rinse and pending this hospitalization they may discuss hospice with him.  Cardiology was consulted for his chest heaviness and chest pain likely in the setting of demand ischemia from low hemoglobin.  Patient was given IV Lasix yesterday with improvement in his symptoms.  Subsequent work-up found that the  patient has a Streptococcus bacteremic and given his history of streptococcal bacteremia with presumed endocarditis ID was consulted and patient was placed on IV ceftriaxone.  Assessment & Plan:   Principal Problem:   Chest pain Active Problems:   Essential hypertension   S/P aortic valve replacement with bioprosthetic valve    S/P CABG x 2   Hyponatremia   Pancytopenia (HCC)   MDS (myelodysplastic syndrome), high grade (HCC)   OSA on CPAP   Thrombocytopenia (HCC)   Acute blood loss anemia (ABLA)   Bacteremia due to Gram-positive bacteria  Chest pain with Dyspena  -Concerning for possible Type 2 non-ST elevation MI secondary to demand ischemia from severe anemia -Patient has a history of coronary artery disease and is status post CABG x 2 -Twelve-lead EKG reviewed by me shows an incomplete left bundle branch block with nonspecific ST-T wave changes -Will cycle cardiac enzymes -Patient is unable to receive aspirin or anticoagulation due to severe anemia requiring blood transfusion -Will obtain 2D echocardiogram to rule out regional wall motion abnormality; ECHO as below shows "There is severe hypokinesis of the septum that is out of proportion to postoperative septal movement. This  appears new compared with prior. The findings possibly suggest progression of disease proximal to LIMA-LAD  insertion. Left ventricular ejection fraction, by estimation, is 50 to 55%. The left ventricle has low normal function. The left ventricle demonstrates regional wall motion abnormalities (see scoring  diagram/findings for description). There is mild concentric left ventricular hypertrophy."  -We will transfuse 3 units of packed RBC for symptomatic anemia; Given Lasix with improvement  -Continue statins and reduced dose of beta-blocker  to 12.5 mg twice daily -GI evaluated and they feel that given his significant hematuria and severe anemia as well as severe thrombocytopenia he would not be a good candidate  for interventional measures and they evaluated and felt that his chest pain was mainly noticeable when he is laying down and did not occur with walk around.  They feel that his symptoms only happened after he received blood and after he was given IV Lasix yesterday he had not any further chest discomfort -Cardiology evaluated EKG and they saw significant ST depression in the setting of severe anemia -Cardiology feels patient had demand ischemia in the setting of pulmonary edema as well as severe anemia with hemoglobin 4.3 and recommending continue metoprolol tartrate but avoiding heparin, aspirin or other antiplatelet given severe thrombocytopenia -Cardiology recommended goal-directed medical therapy with Crestor and metoprolol but because the patient had a recent AKI he is off his ACE inhibitor   Myelodysplastic Syndrome with Pancytopenia -Severe anemia/thrombocytopenia with acute blood loss anemia secondary to hematuria, epistaxis and mucosal bleeding from his gums -Patient noted to have a hemoglobin of 4.3g/dl down from 7.4g/dl, 2 days ago -He also has platelet count of 6000 -Repeat labs today show that he patient has a WBC of 0.9, hemoglobin/hematocrit of 8.2/23.9, and platelet count of 12 now -We will transfuse 3 units of packed RBC as well as 2 packs of platelets  -Will consult hematology/oncology and Dr. Marin Olp consulted  -Pending patient's clinical course patient may be hospice appropriate and medical oncology does not feel that he has been appropriately responding to the chemotherapy  Streptococcus Bacteremia -Blood Cx x2 showed GPC Streptococcus Species -Has a Hx of Streptococcus Bacteremia and Endocarditis (Has a Prosthetic Aortic Valve) -ECHO Done but he is not a good candidate for TEE Given Pancytopenia -He has a Port-A-Cath in place and this has been placed since January and there is concern for line infection and ID recommending considering line removal and depending on prognosis  and what the plan is going forward if he chooses aggressive measures and not hospice he will need to consider port removal -ID has started the patient IV ceftriaxone and will repeat blood cultures times 2 in the morning   Gross Hematuria -Most likely secondary to severe thrombocytopenia -Monitor closely as it is improving    HTN -Continue low-dose beta-blocker -Continue to monitor blood pressure closely -Continue to monitor blood pressures per protocol -Last blood pressure reading was  Hypokalemia -Patient's Potassium is now 3.4 -Replete with p.o. potassium chloride mEQ twice daily x2 doses -Continue monitor and replete as necessary -Mag level was 1.9 -Repeat CMP in the a.m.  DM2 -Maintain consistent carbohydrate diet -Continue with sliding scale insulin for glycemic control -CBG been ranging from 108-182  Hyponatremia -Patient's Na+ went from 127 -> 124 -Continue to Monitor and Trend -Repeat CMP in the AM   DVT prophylaxis: SCDs Code Status: FULL CODE  Family Communication: Discussed with Wife at bedside  Disposition Plan: Pending further clinical Improvement   Status is: Inpatient  Remains inpatient appropriate because:IV treatments appropriate due to intensity of illness or inability to take PO and Inpatient level of care appropriate due to severity of illness  Dispo: The patient is from: Home              Anticipated d/c is to: Home              Patient currently is not medically stable to d/c.   Difficult to place patient No  Consultants:  Cardiology Medical Oncology ID  Procedures:  ECHOCARDIOGRAM  IMPRESSIONS     1. There is severe hypokinesis of the septum that is out of proportion to  postoperative septal movement. This appears new compared with prior. The  findings possibly suggest progression of disease proximal to LIMA-LAD  insertion. Left ventricular ejection   fraction, by estimation, is 50 to 55%. The left ventricle has low normal  function.  The left ventricle demonstrates regional wall motion  abnormalities (see scoring diagram/findings for description). There is  mild concentric left ventricular hypertrophy.  Indeterminate diastolic filling due to E-A fusion.   2. Right ventricular systolic function is normal. The right ventricular  size is normal. There is normal pulmonary artery systolic pressure. The  estimated right ventricular systolic pressure is 123XX123 mmHg.   3. The mitral valve is grossly normal. Mild mitral valve regurgitation.  No evidence of mitral stenosis.   4. 25 mm Magna Ease Pericardial bioprosthesis in the aortic position.  Vmax 2.2 m/s, MG 12 mmHG, EOA 1.92 cm2, DI 0.56. The leaflets appear  thickened but there is no increase in gradients or parameters to suggest  stenosis. The aortic valve has been  repaired/replaced. Aortic valve regurgitation is not visualized. Procedure  Date: 10/26/2017.   5. The inferior vena cava is normal in size with greater than 50%  respiratory variability, suggesting right atrial pressure of 3 mmHg.   Comparison(s): Changes from prior study are noted.   FINDINGS   Left Ventricle: There is severe hypokinesis of the septum that is out of  proportion to postoperative septal movement. This appears new compared  with prior. The findings possibly suggest progression of disease proximal  to LIMA-LAD insertion. Left  ventricular ejection fraction, by estimation, is 50 to 55%. The left  ventricle has low normal function. The left ventricle demonstrates  regional wall motion abnormalities. The left ventricular internal cavity  size was normal in size. There is mild  concentric left ventricular hypertrophy. Abnormal (paradoxical) septal  motion consistent with post-operative status. Indeterminate diastolic  filling due to E-A fusion.      LV Wall Scoring:  The mid and distal anterior septum and inferior septum are hypokinetic.   Right Ventricle: The right ventricular size is  normal. No increase in  right ventricular wall thickness. Right ventricular systolic function is  normal. There is normal pulmonary artery systolic pressure. The tricuspid  regurgitant velocity is 2.86 m/s, and   with an assumed right atrial pressure of 3 mmHg, the estimated right  ventricular systolic pressure is 123XX123 mmHg.   Left Atrium: Left atrial size was normal in size.   Right Atrium: Right atrial size was normal in size. Prominent Chiari  network.   Pericardium: There is no evidence of pericardial effusion.   Mitral Valve: The mitral valve is grossly normal. Mild mitral valve  regurgitation. No evidence of mitral valve stenosis.   Tricuspid Valve: The tricuspid valve is grossly normal. Tricuspid valve  regurgitation is trivial. No evidence of tricuspid stenosis.   Aortic Valve: 25 mm Magna Ease Pericardial bioprosthesis in the aortic  position. Vmax 2.2 m/s, MG 12 mmHG, EOA 1.92 cm2, DI 0.56. The leaflets  appear thickened but there is no increase in gradients or parameters to  suggest stenosis. The aortic valve has  been repaired/replaced. Aortic valve regurgitation is not visualized.  Aortic valve mean gradient measures 13.0 mmHg. Aortic valve peak gradient  measures 19.4 mmHg. Aortic valve area, by VTI measures 1.92 cm. There is  a 25 mm [facility] Ease bovine  valve present in the aortic position.   Pulmonic Valve: The pulmonic valve was grossly normal. Pulmonic valve  regurgitation is trivial. No evidence of pulmonic stenosis.   Aorta: The aortic root and ascending aorta are structurally normal, with  no evidence of dilitation.   Venous: The inferior vena cava is normal in size with greater than 50%  respiratory variability, suggesting right atrial pressure of 3 mmHg.   IAS/Shunts: The atrial septum is grossly normal.      LEFT VENTRICLE  PLAX 2D  LVIDd:         4.90 cm      Diastology  LVIDs:         3.50 cm      LV e' medial:    6.53 cm/s  LV PW:          1.30 cm      LV E/e' medial:  16.4  LV IVS:        1.20 cm      LV e' lateral:   10.40 cm/s  LVOT diam:     2.10 cm      LV E/e' lateral: 10.3  LV SV:         76  LV SV Index:   37  LVOT Area:     3.46 cm     LV Volumes (MOD)  LV vol d, MOD A4C: 120.0 ml  LV vol s, MOD A4C: 50.2 ml  LV SV MOD A4C:     120.0 ml   RIGHT VENTRICLE  RV Basal diam:  3.20 cm  RV S prime:     9.03 cm/s  TAPSE (M-mode): 2.0 cm   LEFT ATRIUM             Index       RIGHT ATRIUM           Index  LA diam:        4.60 cm 2.23 cm/m  RA Area:     15.30 cm  LA Vol (A2C):   78.4 ml 38.04 ml/m RA Volume:   36.30 ml  17.62 ml/m  LA Vol (A4C):   53.1 ml 25.77 ml/m  LA Biplane Vol: 65.3 ml 31.69 ml/m   AORTIC VALVE  AV Area (Vmax):    1.79 cm  AV Area (Vmean):   1.54 cm  AV Area (VTI):     1.92 cm  AV Vmax:           220.33 cm/s  AV Vmean:          166.000 cm/s  AV VTI:            0.396 m  AV Peak Grad:      19.4 mmHg  AV Mean Grad:      13.0 mmHg  LVOT Vmax:         114.00 cm/s  LVOT Vmean:        73.700 cm/s  LVOT VTI:          0.220 m  LVOT/AV VTI ratio: 0.56     AORTA  Ao Root diam: 2.90 cm   MITRAL VALVE                TRICUSPID VALVE  MV Area (PHT): 3.93 cm     TR Peak grad:   32.7 mmHg  MV Decel Time: 193 msec     TR Vmax:  286.00 cm/s  MV E velocity: 107.00 cm/s  MV A velocity: 32.50 cm/s   SHUNTS  MV E/A ratio:  3.29         Systemic VTI:  0.22 m                              Systemic Diam: 2.10 cm   Antimicrobials:  Anti-infectives (From admission, onward)    Start     Dose/Rate Route Frequency Ordered Stop   07/08/21 1315  cefTRIAXone (ROCEPHIN) 2 g in sodium chloride 0.9 % 100 mL IVPB        2 g 200 mL/hr over 30 Minutes Intravenous Every 24 hours 07/08/21 1221     07/08/21 1000  famciclovir (FAMVIR) tablet 500 mg        500 mg Oral Daily 07/07/21 1521     07/08/21 1000  fluconazole (DIFLUCAN) tablet 100 mg        100 mg Oral Daily 07/07/21 1521     07/07/21 1330   vancomycin (VANCOREADY) IVPB 2000 mg/400 mL        2,000 mg 200 mL/hr over 120 Minutes Intravenous  Once 07/07/21 1256 07/07/21 1737   07/07/21 1300  ceFEPIme (MAXIPIME) 2 g in sodium chloride 0.9 % 100 mL IVPB        2 g 200 mL/hr over 30 Minutes Intravenous  Once 07/07/21 1255 07/07/21 1537        Subjective: Seen and examined at bedside and thinks his hematuria is doing a bit better.  No longer short of breath.  Is received 3 units of PRBCs and 2 packs of platelets.  Denies any chest pain or short of breath.  Bleeding seems to have subsided.  No other concerns or complaints at this time.  Objective: Vitals:   07/08/21 0839 07/08/21 0951 07/08/21 1006 07/08/21 1230  BP: 119/65 129/72 110/60 116/66  Pulse: 83 91 80 92  Resp: '18 18 16 16  '$ Temp: 98.7 F (37.1 C) 97.7 F (36.5 C) 98.8 F (37.1 C) 98 F (36.7 C)  TempSrc: Oral Oral Oral Oral  SpO2:   100%   Weight:      Height:        Intake/Output Summary (Last 24 hours) at 07/08/2021 1651 Last data filed at 07/08/2021 1300 Gross per 24 hour  Intake 2079.14 ml  Output 2615 ml  Net -535.86 ml   Filed Weights   07/07/21 1918  Weight: 90.2 kg   Examination: Physical Exam:  Constitutional: WN/WD elderly overweight chronically ill-appearing Caucasian male, NAD and appears calm  Eyes: Lids and conjunctivae slightly pale, sclerae anicteric  ENMT: External Ears, Nose appear normal. Grossly normal hearing.  Neck: Appears normal, supple, no cervical masses, normal ROM, no appreciable thyromegaly; no JVD Respiratory: Diminished to auscultation bilaterally, no wheezing, rales, rhonchi or crackles. Normal respiratory effort and patient is not tachypenic. No accessory muscle use.  Unlabored breathing Cardiovascular: RRR, no murmurs / rubs / gallops. S1 and S2 auscultated.  Slight lower extremity edema Abdomen: Soft, non-tender, non-distended. Bowel sounds positive.  GU: Deferred. Musculoskeletal: No clubbing / cyanosis of  digits/nails. No joint deformity upper and lower extremities.  Skin: No rashes, lesions, ulcers on limited skin evaluation. No induration; Warm and dry.  Neurologic: CN 2-12 grossly intact with no focal deficits. Romberg sign and cerebellar reflexes not assessed.  Psychiatric: Normal judgment and insight. Alert and oriented x 3. Normal mood and appropriate affect.  Data Reviewed: I have personally reviewed following labs and imaging studies  CBC: Recent Labs  Lab 07/02/21 0508 07/03/21 0512 07/05/21 1030 07/07/21 1106 07/08/21 0839 07/08/21 1138  WBC 2.7* 2.3* 1.9* 1.4* 1.1* 0.9*  NEUTROABS 2.1 2.0 1.6* 1.1*  --  0.7*  HGB 5.9* 7.7* 7.4* 4.3* 8.5* 8.2*  HCT 17.0* 22.3* 21.4* 13.0* 24.7* 23.9*  MCV 84.6 84.8 85.6 88.4 84.9 86.6  PLT 23* 11* 6* 6* 8* 12*   Basic Metabolic Panel: Recent Labs  Lab 07/03/21 0512 07/05/21 1030 07/07/21 1106 07/08/21 0839 07/08/21 1138  NA 125* 124* 125* 127* 124*  K 3.5 3.7 3.7 3.5 3.4*  CL 94* 95* 96* 96* 93*  CO2 23 22 21* 21* 22  GLUCOSE 175* 125* 140* 146* 195*  BUN '23 20 17 18 19  '$ CREATININE 1.15 1.10 1.03 0.90 0.96  CALCIUM 8.0* 9.0 8.2* 8.4* 8.4*  MG  --   --   --   --  1.9  PHOS  --   --   --   --  4.0   GFR: Estimated Creatinine Clearance: 77.2 mL/min (by C-G formula based on SCr of 0.96 mg/dL). Liver Function Tests: Recent Labs  Lab 07/02/21 0630 07/03/21 0512 07/05/21 1030 07/07/21 1106 07/08/21 1138  AST 88* 62* 36 36 38  ALT 53* 50* 42 39 39  ALKPHOS 62 62 69 55 54  BILITOT 1.3* 1.4* 1.0 1.1 1.0  PROT 6.3* 6.4* 7.1 6.8 7.0  ALBUMIN 2.9* 3.1* 3.7 3.1* 3.4*   No results for input(s): LIPASE, AMYLASE in the last 168 hours. No results for input(s): AMMONIA in the last 168 hours. Coagulation Profile: Recent Labs  Lab 07/08/21 1138  INR 1.2   Cardiac Enzymes: No results for input(s): CKTOTAL, CKMB, CKMBINDEX, TROPONINI in the last 168 hours. BNP (last 3 results) Recent Labs    12/09/20 1629  PROBNP 415*    HbA1C: No results for input(s): HGBA1C in the last 72 hours. CBG: Recent Labs  Lab 07/03/21 1230 07/07/21 1642 07/07/21 2205 07/08/21 0836 07/08/21 1233  GLUCAP 188* 134* 141* 142* 182*   Lipid Profile: No results for input(s): CHOL, HDL, LDLCALC, TRIG, CHOLHDL, LDLDIRECT in the last 72 hours. Thyroid Function Tests: No results for input(s): TSH, T4TOTAL, FREET4, T3FREE, THYROIDAB in the last 72 hours. Anemia Panel: No results for input(s): VITAMINB12, FOLATE, FERRITIN, TIBC, IRON, RETICCTPCT in the last 72 hours. Sepsis Labs: No results for input(s): PROCALCITON, LATICACIDVEN in the last 168 hours.  Recent Results (from the past 240 hour(s))  Blood culture (routine x 2)     Status: None (Preliminary result)   Collection Time: 07/07/21 12:40 PM   Specimen: BLOOD  Result Value Ref Range Status   Specimen Description   Final    BLOOD RIGHT ANTECUBITAL Performed at [facility] [address], [place], [place] [address]    Special Requests   Final    BOTTLES DRAWN AEROBIC AND ANAEROBIC Blood Culture adequate volume Performed at [facility] [address], [place], [place] [address]    Culture  Setup Time   Final    GRAM POSITIVE COCCI IN CHAINS IN BOTH AEROBIC AND ANAEROBIC BOTTLES CRITICAL VALUE NOTED.  VALUE IS CONSISTENT WITH PREVIOUSLY REPORTED AND CALLED VALUE.    Culture   Final    NO GROWTH < 24 HOURS Performed at [facility], [place] [address] [place], [place] [address]    Report Status PENDING  Incomplete  Blood  culture (routine x 2)     Status: None (Preliminary result)   Collection Time: 07/07/21  1:20 PM   Specimen: BLOOD  Result Value Ref Range Status   Specimen Description   Final    BLOOD LEFT ANTECUBITAL Performed at [facility] [address], [place], [place] [address]    Special Requests   Final    BOTTLES DRAWN AEROBIC AND ANAEROBIC Blood Culture results may not be optimal due  to an excessive volume of blood received in culture bottles Performed at [facility] [address] [place], [place] [address]    Culture  Setup Time   Final    GRAM POSITIVE COCCI IN CHAINS IN BOTH AEROBIC AND ANAEROBIC BOTTLES CRITICAL RESULT CALLED TO, READ BACK BY AND VERIFIED WITH: PHARMD MARY TUCKER 07/08/21'@10'$ :56 BY TW Performed at [facility], [place] [address], [place], [place] [address]    Culture GRAM POSITIVE COCCI IN CHAINS  Final   Report Status PENDING  Incomplete  Blood Culture ID Panel (Reflexed)     Status: Abnormal   Collection Time: 07/07/21  1:20 PM  Result Value Ref Range Status   Enterococcus faecalis NOT DETECTED NOT DETECTED Final   Enterococcus Faecium NOT DETECTED NOT DETECTED Final   Listeria monocytogenes NOT DETECTED NOT DETECTED Final   Staphylococcus species NOT DETECTED NOT DETECTED Final   Staphylococcus aureus (BCID) NOT DETECTED NOT DETECTED Final   Staphylococcus epidermidis NOT DETECTED NOT DETECTED Final   Staphylococcus lugdunensis NOT DETECTED NOT DETECTED Final   Streptococcus species DETECTED (A) NOT DETECTED Final    Comment: Not Enterococcus species, Streptococcus agalactiae, Streptococcus pyogenes, or Streptococcus pneumoniae. CRITICAL RESULT CALLED TO, READ BACK BY AND VERIFIED WITH: PHARMD MARY TUCKER 07/08/21'@10'$ :55 BY TW    Streptococcus agalactiae NOT DETECTED NOT DETECTED Final   Streptococcus pneumoniae NOT DETECTED NOT DETECTED Final   Streptococcus pyogenes NOT DETECTED NOT DETECTED Final   A.calcoaceticus-baumannii NOT DETECTED NOT DETECTED Final   Bacteroides fragilis NOT DETECTED NOT DETECTED Final   Enterobacterales NOT DETECTED NOT DETECTED Final   Enterobacter cloacae complex NOT DETECTED NOT DETECTED Final   Escherichia coli NOT DETECTED NOT DETECTED Final   Klebsiella aerogenes NOT DETECTED NOT DETECTED Final   Klebsiella oxytoca NOT DETECTED NOT DETECTED Final   Klebsiella pneumoniae NOT  DETECTED NOT DETECTED Final   Proteus species NOT DETECTED NOT DETECTED Final   Salmonella species NOT DETECTED NOT DETECTED Final   Serratia marcescens NOT DETECTED NOT DETECTED Final   Haemophilus influenzae NOT DETECTED NOT DETECTED Final   Neisseria meningitidis NOT DETECTED NOT DETECTED Final   Pseudomonas aeruginosa NOT DETECTED NOT DETECTED Final   Stenotrophomonas maltophilia NOT DETECTED NOT DETECTED Final   Candida albicans NOT DETECTED NOT DETECTED Final   Candida auris NOT DETECTED NOT DETECTED Final   Candida glabrata NOT DETECTED NOT DETECTED Final   Candida krusei NOT DETECTED NOT DETECTED Final   Candida parapsilosis NOT DETECTED NOT DETECTED Final   Candida tropicalis NOT DETECTED NOT DETECTED Final   Cryptococcus neoformans/gattii NOT DETECTED NOT DETECTED Final    Comment: Performed at [facility], [address], [place], [place] [address]    RN Pressure Injury Documentation:     Estimated body mass index is 29.36 kg/m as calculated from the following:   Height as of this encounter: '5\' 9"'$  (1.753 m).   Weight as of this encounter: 90.2 kg.  Malnutrition Type:  Nutrition Problem: Increased nutrient needs Etiology:  chronic illness, cancer and cancer related treatments   Malnutrition Characteristics:  Signs/Symptoms: estimated needs   Nutrition Interventions:  Interventions: Premier Protein, MVI   Radiology Studies: DG Chest 2 View  Result Date: 07/07/2021 CLINICAL DATA:  75 year old male with chest pressure and fatigue for 1 week. Pancytopenia. EXAM: CHEST - 2 VIEW COMPARISON:  Chest radiographs 07/01/2021 and earlier. FINDINGS: PA and lateral views today. Stable right chest power port. Prior sternotomy and cardiac valve replacement. Mediastinal contours are stable and within normal limits. Regressed pulmonary interstitial opacity from earlier this month. The left lung now appears clear. No pneumothorax or layering pleural fluid. However, there is  indistinct right middle lobe opacity on both views, which persists from earlier this month. No acute osseous abnormality identified. Negative visible bowel gas pattern. IMPRESSION: 1. Confluent but indistinct right middle lobe opacity not improved from 6 days ago. This is nonspecific, but consider right middle lobe pneumonia. 2. Otherwise pulmonary interstitial edema appears resolved from earlier this month. No new cardiopulmonary abnormality. Electronically Signed   By: Genevie Ann M.D.   On: 07/07/2021 10:27   CT CHEST WO CONTRAST  Result Date: 07/07/2021 CLINICAL DATA:  Pneumonia EXAM: CT CHEST WITHOUT CONTRAST TECHNIQUE: Multidetector CT imaging of the chest was performed following the standard protocol without IV contrast. COMPARISON:  CT chest abdomen pelvis, 12/11/2020 FINDINGS: Cardiovascular: Right chest port catheter. Aortic atherosclerosis. Aortic valve prosthesis. Cardiomegaly. Extensive 3 vessel coronary artery calcifications and/or stents. No pericardial effusion. Mediastinum/Nodes: Newly enlarged right hilar, subcarinal, pretracheal, and high right paratracheal lymph nodes, largest right paratracheal node measuring 1.4 x 1.2 cm (series 2, image 27). Thyroid gland, trachea, and esophagus demonstrate no significant findings. Lungs/Pleura: Small bilateral pleural effusions and associated atelectasis or consolidation. Interlobular septal thickening throughout the lungs. There is heterogeneous airspace opacity and consolidation of the lateral segment right middle lobe (series 4, image 75). There are numerous new bilateral pulmonary nodules of varying sizes, for example a 1.7 x 1.5 cm nodule of the left apex (series 4, image 20), a 0.7 cm nodule of the right upper lobe (series 4, image 44), and a 1.4 x 1.3 cm nodule of the peripheral left upper lobe (series 4, image 43). Upper Abdomen: No acute abnormality. Musculoskeletal: No chest wall mass or suspicious bone lesions identified. IMPRESSION: 1. There is  heterogeneous airspace opacity and consolidation of the lateral segment right middle lobe , consistent with infection or aspiration. 2. There are numerous new bilateral pulmonary nodules of varying sizes, which are generally nonspecific, possibly infectious or inflammatory in nature although concerning for pulmonary metastatic disease. 3. Newly enlarged mediastinal lymph nodes, which may be reactive to infection, although as above, malignancy is difficult to exclude. 4. Small bilateral pleural effusions and associated atelectasis or consolidation. 5. Interlobular septal thickening throughout the lungs, consistent with pulmonary edema. 6. Cardiomegaly and coronary artery disease. Aortic Atherosclerosis (ICD10-I70.0). Electronically Signed   By: Eddie Candle M.D.   On: 07/07/2021 15:50   ECHOCARDIOGRAM COMPLETE  Result Date: 07/08/2021    ECHOCARDIOGRAM REPORT   Patient Name:   Theodore Jenkins [facility] Date of Exam: 07/08/2021 Medical Rec #:  [id number]   Height:       69.0 in Accession #:    [id number]  Weight:       198.8 lb Date of Birth:  04-Aug-1946  BSA:          2.061 m Patient Age:    63 years    BP:  137/78 mmHg Patient Gender: M           HR:           88 bpm. Exam Location:  Inpatient Procedure: 2D Echo, Cardiac Doppler and Color Doppler Indications:    Chest pain  History:        Patient has prior history of Echocardiogram examinations, most                 recent 12/11/2020. CAD, Prior CABG, Stroke and Carotid Disease,                 Signs/Symptoms:Chest Pain and Hematuria; Risk                 Factors:Hypertension, Diabetes, Dyslipidemia and Obesity.  Sonographer:    Dustin Flock Referring Phys: [id number] [place] AGBATA IMPRESSIONS  1. There is severe hypokinesis of the septum that is out of proportion to postoperative septal movement. This appears new compared with prior. The findings possibly suggest progression of disease proximal to LIMA-LAD insertion. Left ventricular ejection  fraction, by  estimation, is 50 to 55%. The left ventricle has low normal function. The left ventricle demonstrates regional wall motion abnormalities (see scoring diagram/findings for description). There is mild concentric left ventricular hypertrophy. Indeterminate diastolic filling due to E-A fusion.  2. Right ventricular systolic function is normal. The right ventricular size is normal. There is normal pulmonary artery systolic pressure. The estimated right ventricular systolic pressure is 123XX123 mmHg.  3. The mitral valve is grossly normal. Mild mitral valve regurgitation. No evidence of mitral stenosis.  4. 25 mm Magna Ease Pericardial bioprosthesis in the aortic position. Vmax 2.2 m/s, MG 12 mmHG, EOA 1.92 cm2, DI 0.56. The leaflets appear thickened but there is no increase in gradients or parameters to suggest stenosis. The aortic valve has been repaired/replaced. Aortic valve regurgitation is not visualized. Procedure Date: 10/26/2017.  5. The inferior vena cava is normal in size with greater than 50% respiratory variability, suggesting right atrial pressure of 3 mmHg. Comparison(s): Changes from prior study are noted. FINDINGS  Left Ventricle: There is severe hypokinesis of the septum that is out of proportion to postoperative septal movement. This appears new compared with prior. The findings possibly suggest progression of disease proximal to LIMA-LAD insertion. Left ventricular ejection fraction, by estimation, is 50 to 55%. The left ventricle has low normal function. The left ventricle demonstrates regional wall motion abnormalities. The left ventricular internal cavity size was normal in size. There is mild concentric left ventricular hypertrophy. Abnormal (paradoxical) septal motion consistent with post-operative status. Indeterminate diastolic filling due to E-A fusion.  LV Wall Scoring: The mid and distal anterior septum and inferior septum are hypokinetic. Right Ventricle: The right ventricular size is normal. No  increase in right ventricular wall thickness. Right ventricular systolic function is normal. There is normal pulmonary artery systolic pressure. The tricuspid regurgitant velocity is 2.86 m/s, and  with an assumed right atrial pressure of 3 mmHg, the estimated right ventricular systolic pressure is 123XX123 mmHg. Left Atrium: Left atrial size was normal in size. Right Atrium: Right atrial size was normal in size. Prominent Chiari network. Pericardium: There is no evidence of pericardial effusion. Mitral Valve: The mitral valve is grossly normal. Mild mitral valve regurgitation. No evidence of mitral valve stenosis. Tricuspid Valve: The tricuspid valve is grossly normal. Tricuspid valve regurgitation is trivial. No evidence of tricuspid stenosis. Aortic Valve: 25 mm Magna Ease Pericardial bioprosthesis in the aortic position. Vmax 2.2 m/s,  MG 12 mmHG, EOA 1.92 cm2, DI 0.56. The leaflets appear thickened but there is no increase in gradients or parameters to suggest stenosis. The aortic valve has been repaired/replaced. Aortic valve regurgitation is not visualized. Aortic valve mean gradient measures 13.0 mmHg. Aortic valve peak gradient measures 19.4 mmHg. Aortic valve area, by VTI measures 1.92 cm. There is a 25 mm [employer] Ease bovine valve present in the aortic position. Pulmonic Valve: The pulmonic valve was grossly normal. Pulmonic valve regurgitation is trivial. No evidence of pulmonic stenosis. Aorta: The aortic root and ascending aorta are structurally normal, with no evidence of dilitation. Venous: The inferior vena cava is normal in size with greater than 50% respiratory variability, suggesting right atrial pressure of 3 mmHg. IAS/Shunts: The atrial septum is grossly normal.  LEFT VENTRICLE PLAX 2D LVIDd:         4.90 cm      Diastology LVIDs:         3.50 cm      LV e' medial:    6.53 cm/s LV PW:         1.30 cm      LV E/e' medial:  16.4 LV IVS:        1.20 cm      LV e' lateral:   10.40 cm/s LVOT diam:      2.10 cm      LV E/e' lateral: 10.3 LV SV:         76 LV SV Index:   37 LVOT Area:     3.46 cm  LV Volumes (MOD) LV vol d, MOD A4C: 120.0 ml LV vol s, MOD A4C: 50.2 ml LV SV MOD A4C:     120.0 ml RIGHT VENTRICLE RV Basal diam:  3.20 cm RV S prime:     9.03 cm/s TAPSE (M-mode): 2.0 cm LEFT ATRIUM             Index       RIGHT ATRIUM           Index LA diam:        4.60 cm 2.23 cm/m  RA Area:     15.30 cm LA Vol (A2C):   78.4 ml 38.04 ml/m RA Volume:   36.30 ml  17.62 ml/m LA Vol (A4C):   53.1 ml 25.77 ml/m LA Biplane Vol: 65.3 ml 31.69 ml/m  AORTIC VALVE AV Area (Vmax):    1.79 cm AV Area (Vmean):   1.54 cm AV Area (VTI):     1.92 cm AV Vmax:           220.33 cm/s AV Vmean:          166.000 cm/s AV VTI:            0.396 m AV Peak Grad:      19.4 mmHg AV Mean Grad:      13.0 mmHg LVOT Vmax:         114.00 cm/s LVOT Vmean:        73.700 cm/s LVOT VTI:          0.220 m LVOT/AV VTI ratio: 0.56  AORTA Ao Root diam: 2.90 cm MITRAL VALVE                TRICUSPID VALVE MV Area (PHT): 3.93 cm     TR Peak grad:   32.7 mmHg MV Decel Time: 193 msec     TR Vmax:        286.00 cm/s MV  E velocity: 107.00 cm/s MV A velocity: 32.50 cm/s   SHUNTS MV E/A ratio:  3.29         Systemic VTI:  0.22 m                             Systemic Diam: 2.10 cm Eleonore Chiquito MD Electronically signed by Eleonore Chiquito MD Signature Date/Time: 07/08/2021/2:25:30 PM    Final     Scheduled Meds:  aminocaproic acid  10 mL Oral Q1H   aminocaproic acid  1,000 mg Oral Q8H   Chlorhexidine Gluconate Cloth  6 each Topical Daily   famciclovir  500 mg Oral Daily   fluconazole  100 mg Oral Daily   fluticasone  2 spray Each Nare Daily   gabapentin  600 mg Oral QHS   insulin aspart  0-15 Units Subcutaneous TID WC   loratadine  10 mg Oral q morning   metoprolol tartrate  12.5 mg Oral BID   multivitamin with minerals  1 tablet Oral q morning   pantoprazole  40 mg Oral Daily   polyethylene glycol  17 g Oral Daily   Ensure Max Protein  11 oz  Oral Daily   rosuvastatin  10 mg Oral QHS   sodium chloride flush  10-40 mL Intracatheter Q12H   Continuous Infusions:  cefTRIAXone (ROCEPHIN)  IV 2 g (07/08/21 1356)    LOS: 1 day   Kerney Elbe, DO [facility] PAGER is on AMION  If 7PM-7AM, please contact night-coverage [email or web address]

## 2021-07-08 NOTE — Consult Note (Addendum)
Cardiology Consultation:   Patient ID: Theodore Jenkins MRN: [id number]; DOB: 04/19/1946  Admit date: 07/07/2021 Date of Consult: 07/08/2021  PCP:  Lezlie Octave, [place] [facility] Cardiologist:  Lauree Chandler, MD      Patient Profile:   Theodore Jenkins is a 75 y.o. male with a hx of CAD s/p 2v CABG (LIMA-LAD, SVG-OM) 2018, bioprosthetic AVR at the time of bypass, history of CVA, carotid artery disease s/p R CEA and eventual occlusion of the right carotid artery, AAA, HTN, HLD, DM II and significant myelodysplastic disorder who is being seen 07/08/2021 for the evaluation of chest pain at the request of Dr. Alfredia Ferguson.  History of Present Illness:   Theodore Jenkins is a 75 year old male with past medical history of CAD s/p 2v CABG (LIMA-LAD, SVG-OM) 2018, bioprosthetic AVR at the time of bypass, history of CVA, carotid artery disease s/p R CEA and eventual occlusion of the right carotid artery, AAA, HTN, HLD, DM II and significant myelodysplastic disorder.  Patient had a remote PCI of LAD in 2005.  Cardiac catheterization in 2018 showed multivessel disease, he underwent two-vessel CABG and AVR.  He was admitted in May 2021 with fever, malaise and blood culture grew gram-positive cocci.  TEE on 05/01/2020 with significant thickening of the one of the bioprosthetic leaflet suggestive of endocarditis.  He was treated with IV antibiotic.  Repeat echocardiogram in August 2021 showed EF 55%, moderate LVH, mild MR, thickening of the wall of the leaflet of bioprosthetic valve but no aortic insufficiency or aortic stenosis.  Lab work in December 2021 revealed a pancytopenia and he was diagnosed with myelodysplastic disorder and has been followed by Dr. Marin Olp of oncology service.  Repeat echocardiogram on 12/11/2020 showed EF 55 to 60%, trivial MR, normal functional bioprosthetic AVR.  Aspirin stopped by hematology.  Unfortunately, according to oncology note, his myelodysplastic disorder has  been resistant to treatment.    He was seen in the ED on 06/26/2021 with significant nosebleed.  He was readmitted to the hospital on 67/17/2022 with gross hematuria.  Urology was consulted, it was felt hematuria was the consequence of his severe thrombocytopenia related to myelodysplastic disorder.  Hemoglobin was also low around 7.  Platelet was around 7000.  He received multiple platelet transfusion and red blood cell transfusion.  Bone marrow biopsy was obtained by hematology service in the outpatient setting.  Patient was eventually discharged on 7/23.  Prior to the discharge, shortly after blood transfusion, he started having intermittent episode of chest tightness and inability to take a deep breath.  Symptoms continue to recur after discharge and is worse when laying down and not affected by physical activity.  Eventually, he returned back to the hospital on 07/07/2021 with complaint of chest heaviness, shortness of breath, and persistent hematuria.  On arrival, his BNP was elevated 825, serial troponin was elevated at 446 --> 483--> 486-->499.  He was severely anemic with hemoglobin of 4.3.  White blood cell count 1.4.  Platelet 6000.  Blood culture was obtained.  Large amount of hemoglobin was detected on urine dipstick.  CT of the chest showed consolidation and opacity in the right middle lobe consistent with infection or aspiration, numerous bilateral pulmonary nodules of varying size, newly enlarged mediastinal lymph nodes, small bilateral pleural effusion with interlobar septal thickening throughout the lungs consistent with pulmonary edema.  Patient was given 40 mg IV Lasix.   Cardiology service consulted for chest discomfort.   Past Medical History:  Diagnosis Date   Aortic stenosis    Echo (08/2017): Severe AS with mean gradient of 42 mmHg.   Barrett's esophagus    CAD (coronary artery disease) 06/06/2011   Remote stent to the LAD in 2005 // Last cath in 2010 showing diffuse CAD, small PD  that is occluded and fills with left to right collaterals, 60-70% 1st OM, with normal EF. Managed medically // Myoview 3/18:  Normal perfusion. LVEF 55% with normal wall motion. This is a low risk study.   Complication of anesthesia    Diabetes mellitus    ED (erectile dysfunction)    Family history of adverse reaction to anesthesia 10/23/2017   sister had history of malignant hyperthermia when she was 68, she is 73 years old now   GERD (gastroesophageal reflux disease)    Goals of care, counseling/discussion 12/22/2020   Hyperlipidemia    Hypertension    IHD (ischemic heart disease)    prior stenting of the LAD in December of 2005; with last cath in 2010 showing diffuse CAD with normal LV function. He is managed medically.    Malignant hyperthermia    sister had reaction concerning for malignant hyperthermia (~ 1988)   MDS (myelodysplastic syndrome), high grade ([place]) 12/22/2020   Neuromuscular disorder ([place]) 10/23/2017   lumbar 4 disc in back causing nerve pain, on gabapentin   Obesity    Obstructive sleep apnea on CPAP    PONV (postoperative nausea and vomiting)    S/P aortic valve replacement with bioprosthetic valve 10/26/2017   25 mm [facility] Ease bovine pericardial tissue valve   S/P coronary artery stent placement 2005   LAD    Past Surgical History:  Procedure Laterality Date   AORTIC VALVE REPLACEMENT N/A 10/26/2017   Procedure: AORTIC VALVE REPLACEMENT (AVR), using Magna Ease 25;  Surgeon: Rexene Alberts, MD;  Location: [facility];  Service: Open Heart Surgery;  Laterality: N/A;   CARDIAC CATHETERIZATION  2010   NORMAL LV FUNCTION. DIFFUSE CORONARY DISEASE WITH THE DISTAL PORTION OF THE SMALL POSTERIOR DESCENDING VESSEL OCCLUDED AND FILLING WITH THE RIGHT COLLATERALS, 60-70% STENOSIS IN THE FIRST OBTUSE MARGINAL VESSEL AND DIFFUSE SMALL VESSEL DISEASE   CAROTID ENDARTERECTOMY     CIRCUMCISION  1985   CORONARY ARTERY BYPASS GRAFT N/A 10/26/2017   Procedure: CORONARY ARTERY  BYPASS GRAFTING (CABG) x two , using left internal mammary artery and right leg greater saphenous vein harvested endoscopically;  Surgeon: Rexene Alberts, MD;  Location: [facility];  Service: Open Heart Surgery;  Laterality: N/A;   CORONARY STENT PLACEMENT  2005   IR IMAGING GUIDED PORT INSERTION  12/28/2020   RIGHT/LEFT HEART CATH AND CORONARY ANGIOGRAPHY N/A 08/25/2017   Procedure: RIGHT/LEFT HEART CATH AND CORONARY ANGIOGRAPHY;  Surgeon: Nelva Bush, MD;  Location: [facility];  Service: Cardiovascular;  Laterality: N/A;   rotator cuff surgery Right    TEE WITHOUT CARDIOVERSION N/A 10/26/2017   Procedure: TRANSESOPHAGEAL ECHOCARDIOGRAM (TEE);  Surgeon: Rexene Alberts, MD;  Location: [facility];  Service: Open Heart Surgery;  Laterality: N/A;   TEE WITHOUT CARDIOVERSION N/A 05/01/2020   Procedure: TRANSESOPHAGEAL ECHOCARDIOGRAM (TEE);  Surgeon: Buford Dresser, MD;  Location: [facility] ENDOSCOPY;  Service: Cardiovascular;  Laterality: N/A;     Home Medications:  Prior to Admission medications   Medication Sig Start Date End Date Taking? Authorizing Provider  acetaminophen (TYLENOL) 500 MG tablet Take 1,000 mg by mouth every 8 (eight) hours as needed for mild pain (or headaches).  Yes [provider]  aminocaproic acid (AMICAR) 500 MG tablet Take 2 tablets (1,000 mg total) by mouth every 8 (eight) hours. 07/03/21 10/01/21 Yes Bonnielee Haff, MD  amoxicillin (AMOXIL) 500 MG capsule Take 2,000 mg by mouth See admin instructions. Take 2,000 mg by mouth one hour prior to dental appointments   Yes [provider]  chlorhexidine (PERIDEX) 0.12 % solution Use as directed 15 mLs in the mouth or throat 2 (two) times daily. Patient taking differently: Use as directed 15 mLs in the mouth or throat at bedtime. 01/25/21  Yes Ennever, Rudell Cobb, MD  ciprofloxacin (CIPRO) 500 MG tablet Take 1 tablet (500 mg total) by mouth 2 (two) times daily. 06/08/21  Yes Volanda Napoleon, MD  famciclovir  (FAMVIR) 500 MG tablet Take 1 tablet (500 mg total) by mouth daily. 07/03/21 10/01/21 Yes Bonnielee Haff, MD  fluconazole (DIFLUCAN) 100 MG tablet Take 1 tablet (100 mg total) by mouth daily. 07/03/21 10/01/21 Yes Bonnielee Haff, MD  fluticasone [facility]) 50 MCG/ACT nasal spray Place 2 sprays into both nostrils daily as needed for allergies.   Yes [provider]  gabapentin (NEURONTIN) 300 MG capsule Take 600 mg by mouth at bedtime.    Yes [provider]  lidocaine-prilocaine (EMLA) cream Apply to affected area once Patient taking differently: Apply 1 application topically as needed (access port). Apply to affected area once 01/25/21  Yes Ennever, Rudell Cobb, MD  loratadine (CLARITIN) 10 MG tablet Take 10 mg by mouth every morning.   Yes [provider]  LORazepam (ATIVAN) 0.5 MG tablet Take 1 tablet (0.5 mg total) by mouth every 6 (six) hours as needed (Nausea or vomiting). 01/25/21  Yes Volanda Napoleon, MD  metoprolol succinate (TOPROL-XL) 50 MG 24 hr tablet Take 50 mg by mouth at bedtime.   Yes [provider]  Multiple Vitamin (MULTIVITAMIN WITH MINERALS) TABS tablet Take 1 tablet by mouth every morning.   Yes [provider]  mupirocin ointment (BACTROBAN) 2 % Place into the nose 2 (two) times daily. 07/03/21  Yes Bonnielee Haff, MD  nitroGLYCERIN (NITROSTAT) 0.4 MG SL tablet Place 0.4 mg under the tongue every 5 (five) minutes as needed for chest pain.   Yes [provider]  omeprazole (PRILOSEC) 20 MG capsule Take 20 mg by mouth daily at 6 PM.   Yes [provider]  ondansetron (ZOFRAN) 8 MG tablet Take 1 tablet (8 mg total) by mouth 2 (two) times daily as needed for refractory nausea / vomiting. Start on the third day after last day of chemotherapy. 01/25/21  Yes Ennever, Rudell Cobb, MD  oxymetazoline (AFRIN) 0.05 % nasal spray Place 1 spray into both nostrils 2 (two) times daily. 07/03/21  Yes Bonnielee Haff, MD  polyethylene glycol  (MIRALAX / GLYCOLAX) 17 g packet Take 17 g by mouth 2 (two) times daily. Patient taking differently: Take 17 g by mouth daily as needed for moderate constipation. 07/03/21  Yes Bonnielee Haff, MD  prochlorperazine (COMPAZINE) 10 MG tablet Take 1 tablet (10 mg total) by mouth every 6 (six) hours as needed (Nausea or vomiting). 01/25/21  Yes Volanda Napoleon, MD  rosuvastatin (CRESTOR) 10 MG tablet Take 10 mg by mouth at bedtime.   Yes [provider]  sitaGLIPtin-metformin (JANUMET) 50-1000 MG tablet Take 1 tablet by mouth 2 (two) times daily with a meal.  12/19/16  Yes [provider]  sodium chloride (OCEAN) 0.65 % SOLN nasal spray Place 2 sprays into both nostrils  every 2 (two) hours. 07/03/21  Yes Bonnielee Haff, MD  [facility] VERIO test strip 1 each by Other route daily. 12/29/17   [provider]  senna (SENOKOT) 8.6 MG TABS tablet Take 1 tablet (8.6 mg total) by mouth 2 (two) times daily. 07/03/21   Bonnielee Haff, MD    Inpatient Medications: Scheduled Meds:  aminocaproic acid  10 mL Oral Q1H   aminocaproic acid  1,000 mg Oral Q8H   Chlorhexidine Gluconate Cloth  6 each Topical Daily   famciclovir  500 mg Oral Daily   fluconazole  100 mg Oral Daily   fluticasone  2 spray Each Nare Daily   gabapentin  600 mg Oral QHS   insulin aspart  0-15 Units Subcutaneous TID WC   loratadine  10 mg Oral q morning   metoprolol tartrate  12.5 mg Oral BID   multivitamin with minerals  1 tablet Oral q morning   pantoprazole  40 mg Oral Daily   rosuvastatin  10 mg Oral QHS   sodium chloride flush  10-40 mL Intracatheter Q12H   Continuous Infusions:  PRN Meds: acetaminophen, LORazepam, nitroGLYCERIN, ondansetron **OR** ondansetron (ZOFRAN) IV, sodium chloride flush  Allergies:    Allergies  Allergen Reactions   Oxycodone Nausea And Vomiting    Social History:   Social History   Socioeconomic History   Marital status: Married    Spouse name: Not on file   Number of  children: 3   Years of education: Not on file   Highest education level: Not on file  Occupational History   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, forensic  Tobacco Use   Smoking status: Former    Packs/day: 1.00    Years: 20.00    Pack years: 20.00    Types: Cigarettes    Quit date: 12/12/1992    Years since quitting: 28.5   Smokeless tobacco: Never  Vaping Use   Vaping Use: Never used  Substance and Sexual Activity   Alcohol use: Yes    Alcohol/week: 1.0 standard drink    Types: 1 Cans of beer per week   Drug use: No   Sexual activity: Not Currently  Other Topics Concern   Not on file  Social History Narrative   Not on file   Social Determinants of Health   Financial Resource Strain: Not on file  Food Insecurity: Not on file  Transportation Needs: Not on file  Physical Activity: Not on file  Stress: Not on file  Social Connections: Not on file  Intimate Partner Violence: Not on file    Family History:    Family History  Problem Relation Age of Onset   Cancer Mother    Heart attack Father 61   CAD Brother      ROS:  Please see the history of present illness.   All other ROS reviewed and negative.     Physical Exam/Data:   Vitals:   07/08/21 0322 07/08/21 0509 07/08/21 0534 07/08/21 0839  BP: 114/60 117/62 112/63 119/65  Pulse: 84 85 82 83  Resp: _0 Temp: 98.7 F (37.1 C) 98.8 F (37.1 C) 98.8 F (37.1 C) 98.7 F (37.1 C)  TempSrc: Oral Oral Oral Oral  SpO2: 92% 94% 94%   Weight:      Height:        Intake/Output Summary (Last 24 hours) at 07/08/2021 0900 Last data filed at 07/08/2021 0839 Gross per 24 hour  Intake 1765.47 ml  Output 3000 ml  Net -1234.53 ml   Last 3 Weights 07/07/2021 06/28/2021 06/08/2021  Weight (lbs) 198 lb 12.8 oz 206 lb 2.1 oz 213 lb  Weight (kg) 90.175 kg 93.5 kg 96.616 kg     Body mass index is 29.36 kg/m.  General:  Well nourished, well developed, in no acute distress HEENT: normal Lymph: no  adenopathy Neck: no JVD Endocrine:  No thryomegaly Vascular: No carotid bruits; FA pulses 2+ bilaterally without bruits  Cardiac:  normal S1, S2; RRR; no murmur  Lungs:  clear to auscultation bilaterally, no wheezing, rhonchi or rales  Abd: soft, nontender, no hepatomegaly  Ext: no edema Musculoskeletal:  No deformities, BUE and BLE strength normal and equal Skin: warm and dry  Neuro:  CNs 2-12 intact, no focal abnormalities noted Psych:  Normal affect   EKG:  The EKG was personally reviewed and demonstrates: Normal sinus rhythm with minimal ST depression in V3 through V5 Telemetry:  Telemetry was personally reviewed and demonstrates: Normal sinus rhythm, no significant ventricular ectopy  Relevant CV Studies:  Echo 12/11/2020  1. Left ventricular ejection fraction, by estimation, is 55 to 60%. The  left ventricle has normal function. The left ventricle has no regional  wall motion abnormalities. There is moderate concentric left ventricular  hypertrophy. Left ventricular  diastolic parameters are consistent with Grade II diastolic dysfunction  (pseudonormalization). Elevated left atrial pressure.   2. Right ventricular systolic function is normal. The right ventricular  size is normal. There is normal pulmonary artery systolic pressure.   3. Left atrial size was mildly dilated.   4. Right atrial size was mildly dilated.   5. The mitral valve is normal in structure. Trivial mitral valve  regurgitation.   6. The aortic valve has been repaired/replaced. There is mild thickening  of the aortic valve. Aortic valve regurgitation is not visualized. No  aortic stenosis is present. There is a 25 mm Magna bioprosthetic valve  present in the aortic position.  Procedure Date: 2018. Echo findings are consistent with normal structure  and function of the aortic valve prosthesis. Aortic valve mean gradient  measures 8.7 mmHg. Aortic valve Vmax measures 2.05 m/s.   Comparison(s): No  significant change from prior study. Prior images  reviewed side by side.   Laboratory Data:  High Sensitivity Troponin:   Recent Labs  Lab 07/07/21 1106 07/07/21 1321 07/07/21 1711 07/07/21 1909  TROPONINIHS 446* 483* 486* 499*     Chemistry Recent Labs  Lab 07/03/21 0512 07/05/21 1030 07/07/21 1106  NA 125* 124* 125*  K 3.5 3.7 3.7  CL 94* 95* 96*  CO2 23 22 21*  GLUCOSE 175* 125* 140*  BUN _0 CREATININE 1.15 1.10 1.03  CALCIUM 8.0* 9.0 8.2*  GFRNONAA >60 >60 >60  ANIONGAP _1 Recent Labs  Lab 07/03/21 0512 07/05/21 1030 07/07/21 1106  PROT 6.4* 7.1 6.8  ALBUMIN 3.1* 3.7 3.1*  AST 62* 36 36  ALT 50* 42 39  ALKPHOS 62 69 55  BILITOT 1.4* 1.0 1.1   Hematology Recent Labs  Lab 07/03/21 0512 07/05/21 1030 07/07/21 1106  WBC 2.3* 1.9* 1.4*  RBC 2.63* 2.50* 1.47*  HGB 7.7* 7.4* 4.3*  HCT 22.3* 21.4* 13.0*  MCV 84.8 85.6 88.4  MCH 29.3 29.6 29.3  MCHC 34.5 34.6 33.1  RDW 15.1 15.5 16.4*  PLT 11* 6* 6*   BNP Recent Labs  Lab 07/07/21 1106  BNP 825.3*    DDimer No results for input(s):  DDIMER in the last 168 hours.   Radiology/Studies:  DG Chest 2 View  Result Date: 07/07/2021 CLINICAL DATA:  75 year old male with chest pressure and fatigue for 1 week. Pancytopenia. EXAM: CHEST - 2 VIEW COMPARISON:  Chest radiographs 07/01/2021 and earlier. FINDINGS: PA and lateral views today. Stable right chest power port. Prior sternotomy and cardiac valve replacement. Mediastinal contours are stable and within normal limits. Regressed pulmonary interstitial opacity from earlier this month. The left lung now appears clear. No pneumothorax or layering pleural fluid. However, there is indistinct right middle lobe opacity on both views, which persists from earlier this month. No acute osseous abnormality identified. Negative visible bowel gas pattern. IMPRESSION: 1. Confluent but indistinct right middle lobe opacity not improved from 6 days ago. This is  nonspecific, but consider right middle lobe pneumonia. 2. Otherwise pulmonary interstitial edema appears resolved from earlier this month. No new cardiopulmonary abnormality. Electronically Signed   By: Genevie Ann M.D.   On: 07/07/2021 10:27   CT CHEST WO CONTRAST  Result Date: 07/07/2021 CLINICAL DATA:  Pneumonia EXAM: CT CHEST WITHOUT CONTRAST TECHNIQUE: Multidetector CT imaging of the chest was performed following the standard protocol without IV contrast. COMPARISON:  CT chest abdomen pelvis, 12/11/2020 FINDINGS: Cardiovascular: Right chest port catheter. Aortic atherosclerosis. Aortic valve prosthesis. Cardiomegaly. Extensive 3 vessel coronary artery calcifications and/or stents. No pericardial effusion. Mediastinum/Nodes: Newly enlarged right hilar, subcarinal, pretracheal, and high right paratracheal lymph nodes, largest right paratracheal node measuring 1.4 x 1.2 cm (series 2, image 27). Thyroid gland, trachea, and esophagus demonstrate no significant findings. Lungs/Pleura: Small bilateral pleural effusions and associated atelectasis or consolidation. Interlobular septal thickening throughout the lungs. There is heterogeneous airspace opacity and consolidation of the lateral segment right middle lobe (series 4, image 75). There are numerous new bilateral pulmonary nodules of varying sizes, for example a 1.7 x 1.5 cm nodule of the left apex (series 4, image 20), a 0.7 cm nodule of the right upper lobe (series 4, image 44), and a 1.4 x 1.3 cm nodule of the peripheral left upper lobe (series 4, image 43). Upper Abdomen: No acute abnormality. Musculoskeletal: No chest wall mass or suspicious bone lesions identified. IMPRESSION: 1. There is heterogeneous airspace opacity and consolidation of the lateral segment right middle lobe , consistent with infection or aspiration. 2. There are numerous new bilateral pulmonary nodules of varying sizes, which are generally nonspecific, possibly infectious or inflammatory  in nature although concerning for pulmonary metastatic disease. 3. Newly enlarged mediastinal lymph nodes, which may be reactive to infection, although as above, malignancy is difficult to exclude. 4. Small bilateral pleural effusions and associated atelectasis or consolidation. 5. Interlobular septal thickening throughout the lungs, consistent with pulmonary edema. 6. Cardiomegaly and coronary artery disease. Aortic Atherosclerosis (ICD10-I70.0). Electronically Signed   By: Eddie Candle M.D.   On: 07/07/2021 15:50     Assessment and Plan:   Chest discomfort: In the setting of volume overload and severe anemia.  -With significant hematuria, severe anemia and severe thrombocytopenia, patient would not be good interventional candidate. -His chest pain and was mainly noticeable when laying down and does not occur with walking around.  Symptom only started after he received blood transfusion during the last hospitalization.  CT of the chest revealed pulmonary edema.  He was given 40 mg of IV Lasix yesterday. -Since he was given IV Lasix, he has not had any further chest discomfort.  Internal medicine service is ordering an echocardiogram to assess wall motion abnormality,  as long as EF is normal and that there is no significant wall motion abnormality, I would not recommend any further work-up. -EKG from last admission showed sinus tachycardia with significant ST depression, this is in the setting of severe anemia.  On arrival during this admission, he also had a minimal ST depression from V3 through V5.  Now he has underwent blood and platelet transfusion, I will repeat EKG, I suspect that ST depression has improved.   -Serial troponin was elevated in the 400s, however remained flat on multiple rechecks.  Likely demand ischemia in the setting of pulmonary edema and severe anemia with hemoglobin of 4.3 .  No IV heparin.  Continue on metoprolol tartrate  Severe thrombocytopenia with hematuria  -Received 3  units of packed red blood cell and 1 unit of platelet transfusion.  Severe anemia: Related to #2 and myelodysplastic disorder  -Recurrent hematuria in the setting of severe thrombocytopenia  Myelodysplastic disorder: Followed by oncology service   -According to oncology note, patient has had very little response to chemotherapy so far.  CAD s/p CABG x2 in 2018: See #1.  Not on aspirin given the severe thrombocytopenia and high risk for bleeding.  History of bioprosthetic AVR: Stable on last echocardiogram.  Pending repeat echo  History of CVA  History of carotid artery disease with occluded right ICA  AAA  Hypertension: Continue on metoprolol.  Home lisinopril was discontinued during last admission due to AKI  Hyperlipidemia: Continue on Crestor  DM2    Risk Assessment/Risk Scores:     HEAR Score (for undifferentiated chest pain):  HEAR Score: 5  New York Heart Association (NYHA) Functional Class NYHA Class III        For questions or updates, please contact [facility] Please consult [email or web address] for contact info under    Signed, Almyra Deforest, [place]  07/08/2021 9:00 AM  Personally seen and examined. Agree with above.  75 year old with coronary disease status post CABG in 2018 with bioprosthetic aortic valve replacement at that time secondary to severe aortic stenosis with prior history of right carotid endarterectomy status post stroke with eventual occlusion of the right carotid, AAA hypertension hyperlipidemia and diabetes with significant myelodysplastic disorder, thrombocytopenia/pancytopenia seen for the evaluation of chest pain.  Recent significant epistaxis, hematuria, with recent admission blood transfusion and platelet transfusion with recent discharge on 7/23.  Prior to the discharge she started having some intermittent episodes of chest tightness with a feeling like he could not take in a deep breath.  They recurred after discharge and was worse with laying  down but not affected by any sort of increase in heart rate or physical activity.  He came in with chest heaviness shortness of breath hematuria and his troponins were flat and elevated in the 480 range.  His hemoglobin was 4.3.  CT of the chest showed right middle lobe consolidation aspiration pulmonary nodules mediastinal lymph nodes.  He was given 40 mg of IV Lasix and transfused blood.  After receiving IV Lasix, he reports that his chest discomfort had improved/resolved.  EKG personally reviewed from prior admission showed sinus tachycardia with significant ST depressions in the setting of severe anemia.  Here he had minimal ST depressions from lateral limb leads.  On exam, CABG scar noted, pale, alert and oriented x3, carotid endarterectomy scar noted.  Regular rate.  Petechiae noted.  Some ecchymoses    Assessment and plan:  Chest discomfort - Thankfully improved after dose of Lasix.  Likely related to severe anemia,  high-output, demand ischemia.  Flat elevated troponin is consistent with this.  Not true type I acute coronary syndrome. 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 -Await echocardiogram to evaluate overall pump function. -EKG also compatible with demand ischemia in the setting of anemia.  Continue with goal-directed medical therapy, Crestor for instance, metoprolol.  He is off of ACE inhibitor because of recent AKI.  Most recent ECG with improved ST segment depression.  No further cardiac testing warranted.  Telemetry shows no adverse arrhythmias.  Continue to try to maintain hemoglobin greater than 8.  Will follow up with ECHO. Discussed with he and wife. We will go ahead and sign off.  Please let us know if we can be of further assistance.

## 2021-07-08 NOTE — Progress Notes (Signed)
PHARMACY - PHYSICIAN COMMUNICATION CRITICAL VALUE ALERT - BLOOD CULTURE IDENTIFICATION (BCID)  Theodore Jenkins is an 75 y.o. male who presented to [facility] on 07/07/2021 with a chief complaint of hematuria  Assessment:  PMH significant for myelodysplastic syndrome, aortic valve replacement (bioprosthetic). 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   7/27 BCx: 4/4 Streptococcus species  Name of physician (or Provider) Contacted: Dr. Alfredia Ferguson  Current antibiotics: None. Given vancomycin + cefepime x1 dose on 7/27  Changes to prescribed antibiotics recommended: Started ceftriaxone 2 g IV q24h per protocol.   Results for orders placed or performed during the hospital encounter of 07/07/21  Blood Culture ID Panel (Reflexed) (Collected: 07/07/2021  1:20 PM)  Result Value Ref Range   Enterococcus faecalis NOT DETECTED NOT DETECTED   Enterococcus Faecium NOT DETECTED NOT DETECTED   Listeria monocytogenes NOT DETECTED NOT DETECTED   Staphylococcus species NOT DETECTED NOT DETECTED   Staphylococcus aureus (BCID) NOT DETECTED NOT DETECTED   Staphylococcus epidermidis NOT DETECTED NOT DETECTED   Staphylococcus lugdunensis NOT DETECTED NOT DETECTED   Streptococcus species DETECTED (A) NOT DETECTED   Streptococcus agalactiae NOT DETECTED NOT DETECTED   Streptococcus pneumoniae NOT DETECTED NOT DETECTED   Streptococcus pyogenes NOT DETECTED NOT DETECTED   A.calcoaceticus-baumannii NOT DETECTED NOT DETECTED   Bacteroides fragilis NOT DETECTED NOT DETECTED   Enterobacterales NOT DETECTED NOT DETECTED   Enterobacter cloacae complex NOT DETECTED NOT DETECTED   Escherichia coli NOT DETECTED NOT DETECTED   Klebsiella aerogenes NOT DETECTED NOT DETECTED   Klebsiella oxytoca NOT DETECTED NOT DETECTED   Klebsiella pneumoniae NOT DETECTED NOT DETECTED   Proteus species NOT DETECTED NOT DETECTED   Salmonella species NOT DETECTED NOT DETECTED   Serratia marcescens NOT DETECTED NOT DETECTED   Haemophilus  influenzae NOT DETECTED NOT DETECTED   Neisseria meningitidis NOT DETECTED NOT DETECTED   Pseudomonas aeruginosa NOT DETECTED NOT DETECTED   Stenotrophomonas maltophilia NOT DETECTED NOT DETECTED   Candida albicans NOT DETECTED NOT DETECTED   Candida auris NOT DETECTED NOT DETECTED   Candida glabrata NOT DETECTED NOT DETECTED   Candida krusei NOT DETECTED NOT DETECTED   Candida parapsilosis NOT DETECTED NOT DETECTED   Candida tropicalis NOT DETECTED NOT DETECTED   Cryptococcus neoformans/gattii NOT DETECTED NOT DETECTED    Lenis Noon, PharmD 07/08/2021  12:23 PM

## 2021-07-08 NOTE — Progress Notes (Signed)
Initial Nutrition Assessment  DOCUMENTATION CODES:   Not applicable  INTERVENTION:  - will order Ensure Max once/day, each supplement provides 150 kcal and 30 grams protein.    NUTRITION DIAGNOSIS:   Increased nutrient needs related to chronic illness, cancer and cancer related treatments as evidenced by estimated needs.  GOAL:   Patient will meet greater than or equal to 90% of their needs  MONITOR:   PO intake, Supplement acceptance, Labs, Weight trends  REASON FOR ASSESSMENT:   Malnutrition Screening Tool  ASSESSMENT:   75 y.o. male with medical history significant of myelodysplastic syndrome, CAD s/p CABG, DM, HTN, aortic stenosis s/p aortic valve replacement. He presented to the ED with his wife d/t recent hematuria, chest pressure, epistaxis, and bleeding gums. He was discharged from the hospital on 7/23 after treatment for severe epistaxis d/t severe thrombocytopenia.  Patient was seen by another [facility] RD on 7/19. Today he consumed 80% of breakfast and 95% of lunch.   Weight yesterday was 199 lb and weight on 6/28 was 212 lb. This indicates 13 lb weight loss (6% body weight) in the past 1 month; significant for time frame.  Patient unavailable. Unable to complete NFPE.  Per notes: - chest pain with concern for NSTEMI 2/2 demand ischemia d/t severe anemia - myelodysplastic syndrome with associated pancytopenia--followed by Heme Onc for treatment - gross hematuria    Labs reviewed; CBGs: 142 and 182 mg/dl, Na: 124 mmol/l, K: 3.4 mmol/l, Cl: 93 mmol/l, Ca: 8.4 mg/dl. Medications reviewed; 40 mg IV lasix x1 dose 7/27, sliding scale novolog, 1 tablet multivitamin with minerals/day, 40 mg oral protein, 17 g miralax/day.    NUTRITION - FOCUSED PHYSICAL EXAM:  Unable to complete at this time.  Diet Order:   Diet Order             Diet Carb Modified Fluid consistency: Thin; Room service appropriate? Yes  Diet effective now                    EDUCATION NEEDS:   No education needs have been identified at this time  Skin:  Skin Assessment: Reviewed RN Assessment  Last BM:  7/25  Height:   Ht Readings from Last 1 Encounters:  07/07/21 '5\' 9"'$  (1.753 m)    Weight:   Wt Readings from Last 1 Encounters:  07/07/21 90.2 kg      Estimated Nutritional Needs:  Kcal:  2000-2200 kcal Protein:  105-115 grams Fluid:  >/= 2.2 L/day      Jarome Matin, MS, RD, LDN, CNSC Inpatient Clinical Dietitian RD pager # available in [facility]  After hours/weekend pager # available in [facility]

## 2021-07-08 NOTE — Plan of Care (Signed)

## 2021-07-09 ENCOUNTER — Other Ambulatory Visit (HOSPITAL_COMMUNITY): Payer: Self-pay

## 2021-07-09 ENCOUNTER — Encounter (HOSPITAL_COMMUNITY): Payer: Self-pay | Admitting: Hematology & Oncology

## 2021-07-09 DIAGNOSIS — D46Z Other myelodysplastic syndromes: Secondary | ICD-10-CM | POA: Diagnosis not present

## 2021-07-09 DIAGNOSIS — D469 Myelodysplastic syndrome, unspecified: Secondary | ICD-10-CM | POA: Diagnosis not present

## 2021-07-09 DIAGNOSIS — R7881 Bacteremia: Secondary | ICD-10-CM | POA: Diagnosis not present

## 2021-07-09 DIAGNOSIS — D649 Anemia, unspecified: Secondary | ICD-10-CM | POA: Diagnosis not present

## 2021-07-09 DIAGNOSIS — R319 Hematuria, unspecified: Secondary | ICD-10-CM | POA: Diagnosis not present

## 2021-07-09 LAB — CBC WITH DIFFERENTIAL/PLATELET
Abs Immature Granulocytes: 0.1 10*3/uL — ABNORMAL HIGH (ref 0.00–0.07)
Band Neutrophils: 7 %
Basophils Absolute: 0 10*3/uL (ref 0.0–0.1)
Basophils Relative: 0 %
Blasts: 8 %
Eosinophils Absolute: 0 10*3/uL (ref 0.0–0.5)
Eosinophils Relative: 0 %
HCT: 23.5 % — ABNORMAL LOW (ref 39.0–52.0)
Hemoglobin: 8 g/dL — ABNORMAL LOW (ref 13.0–17.0)
Lymphocytes Relative: 33 %
Lymphs Abs: 0.2 10*3/uL — ABNORMAL LOW (ref 0.7–4.0)
MCH: 29 pg (ref 26.0–34.0)
MCHC: 34 g/dL (ref 30.0–36.0)
MCV: 85.1 fL (ref 80.0–100.0)
Metamyelocytes Relative: 4 %
Monocytes Absolute: 0 10*3/uL — ABNORMAL LOW (ref 0.1–1.0)
Monocytes Relative: 4 %
Myelocytes: 5 %
Neutro Abs: 0.3 10*3/uL — CL (ref 1.7–7.7)
Neutrophils Relative %: 39 %
Platelets: 10 10*3/uL — CL (ref 150–400)
RBC: 2.76 MIL/uL — ABNORMAL LOW (ref 4.22–5.81)
RDW: 16 % — ABNORMAL HIGH (ref 11.5–15.5)
WBC: 0.7 10*3/uL — CL (ref 4.0–10.5)
nRBC: 5.5 % — ABNORMAL HIGH (ref 0.0–0.2)

## 2021-07-09 LAB — TYPE AND SCREEN
ABO/RH(D): A POS
Antibody Screen: NEGATIVE
Unit division: 0
Unit division: 0
Unit division: 0

## 2021-07-09 LAB — COMPREHENSIVE METABOLIC PANEL
ALT: 44 U/L (ref 0–44)
AST: 44 U/L — ABNORMAL HIGH (ref 15–41)
Albumin: 3.1 g/dL — ABNORMAL LOW (ref 3.5–5.0)
Alkaline Phosphatase: 58 U/L (ref 38–126)
Anion gap: 6 (ref 5–15)
BUN: 15 mg/dL (ref 8–23)
CO2: 23 mmol/L (ref 22–32)
Calcium: 8.3 mg/dL — ABNORMAL LOW (ref 8.9–10.3)
Chloride: 99 mmol/L (ref 98–111)
Creatinine, Ser: 0.88 mg/dL (ref 0.61–1.24)
GFR, Estimated: >60 mL/min (ref >60)
Glucose, Bld: 130 mg/dL — ABNORMAL HIGH (ref 70–99)
Potassium: 3.8 mmol/L (ref 3.5–5.1)
Sodium: 128 mmol/L — ABNORMAL LOW (ref 135–145)
Total Bilirubin: 0.8 mg/dL (ref 0.3–1.2)
Total Protein: 6.9 g/dL (ref 6.5–8.1)

## 2021-07-09 LAB — BPAM PLATELET PHERESIS
Blood Product Expiration Date: 202207312359
Blood Product Expiration Date: 202207312359
ISSUE DATE / TIME: 202207280515
ISSUE DATE / TIME: 202207280936
Unit Type and Rh: 5100
Unit Type and Rh: 6200

## 2021-07-09 LAB — PREPARE PLATELET PHERESIS
Unit division: 0
Unit division: 0

## 2021-07-09 LAB — BPAM RBC
Blood Product Expiration Date: 202208092359
Blood Product Expiration Date: 202208112359
Blood Product Expiration Date: 202208112359
ISSUE DATE / TIME: 202207271418
ISSUE DATE / TIME: 202207272014
ISSUE DATE / TIME: 202207280041
Unit Type and Rh: 6200
Unit Type and Rh: 6200
Unit Type and Rh: 6200

## 2021-07-09 LAB — LACTATE DEHYDROGENASE: LDH: 218 U/L — ABNORMAL HIGH (ref 98–192)

## 2021-07-09 LAB — SURGICAL PATHOLOGY

## 2021-07-09 LAB — GLUCOSE, CAPILLARY
Glucose-Capillary: 133 mg/dL — ABNORMAL HIGH (ref 70–99)
Glucose-Capillary: 136 mg/dL — ABNORMAL HIGH (ref 70–99)
Glucose-Capillary: 251 mg/dL — ABNORMAL HIGH (ref 70–99)
Glucose-Capillary: 118 mg/dL — ABNORMAL HIGH (ref 70–99)
Glucose-Capillary: 167 mg/dL — ABNORMAL HIGH (ref 70–99)

## 2021-07-09 LAB — PREALBUMIN: Prealbumin: 11 mg/dL — ABNORMAL LOW (ref 18–38)

## 2021-07-09 LAB — PHOSPHORUS: Phosphorus: 3.2 mg/dL (ref 2.5–4.6)

## 2021-07-09 LAB — MAGNESIUM: Magnesium: 1.9 mg/dL (ref 1.7–2.4)

## 2021-07-09 MED ORDER — AMINOCAPROIC ACID SOLUTION 5% (50 MG/ML)
10.0000 mL | Freq: Four times a day (QID) | ORAL | Status: DC
  Administered 2021-07-09 – 2021-07-14 (×20): 10 mL via ORAL
  Filled 2021-07-09 (×6): qty 100

## 2021-07-09 MED ORDER — EPOETIN ALFA 40000 UNIT/ML IJ SOLN
40000.0000 [IU] | Freq: Once | INTRAMUSCULAR | Status: AC
  Administered 2021-07-09: 40000 [IU] via SUBCUTANEOUS
  Filled 2021-07-09: qty 1

## 2021-07-09 MED ORDER — ENSURE MAX PROTEIN PO LIQD
11.0000 [oz_av] | Freq: Two times a day (BID) | ORAL | Status: DC
  Administered 2021-07-09 – 2021-07-13 (×8): 11 [oz_av] via ORAL
  Administered 2021-07-14: 8 [oz_av] via ORAL
  Filled 2021-07-09 (×13): qty 330

## 2021-07-09 MED ORDER — ROMIPLOSTIM 250 MCG ~~LOC~~ SOLR
2.0000 ug/kg | Freq: Once | SUBCUTANEOUS | Status: AC
  Administered 2021-07-09: 180 ug via SUBCUTANEOUS
  Filled 2021-07-09: qty 0.36

## 2021-07-09 NOTE — Progress Notes (Signed)
PROGRESS NOTE    POLICARPIO CHAND  [id number] DOB: October 01, 1946 DOA: 07/07/2021 PCP: Lezlie Octave, PA-C   Brief Narrative:  The patient is a 75 year old Caucasian overweight male with a past medical history significant for but not limited to myelodysplastic syndrome, history of CAD status post CABG, history of diabetes mellitus type 2, hypertension, history of aortic stenosis status post aortic valve replacement as well as a history of Streptococcus bacteremia with presumed endocarditis last year of the prosthetic valve who presented to the ED for episodes of hematuria, chest pressure, epistaxis as well as bleeding from his gums.  He was recently discharged from the hospital on 07/03/2021 after he was treated for severe epistaxis related to severe thrombocytopenia.  At that time he transfused 5 packs of platelets during that hospitalization received packed RBCs.  Subsequently started developing chest heaviness denied any radiation to his arms or neck.  He denied any nausea or vomiting no diaphoresis.  His stools are black in color but denies any hematemesis or hematochezia.  He was admitted for the evaluation of his hematuria and heaviness in his chest and oncology and cardiology were also consulted.  He has a history of severe myelodysplastic syndrome and recently discharged for epistaxis.  During his hospitalization he was found to have a hemoglobin of 4.3 and a platelet count down to 6000 so he was transfused 3 units of PRBCs and 2 packs of platelets.  Medical oncology evaluated and feel he has had very little response to chemotherapy and they recommended giving the patient some Amicar mouth rinse and pending this hospitalization they may discuss hospice with him.  Cardiology was consulted for his chest heaviness and chest pain likely in the setting of demand ischemia from low hemoglobin.  Patient was given IV Lasix yesterday with improvement in his symptoms.  Subsequent work-up found that the  patient has a Streptococcus bacteremic and given his history of streptococcal bacteremia with presumed endocarditis ID was consulted and patient was placed on IV ceftriaxone.  Because his platelet count was 10,000 today hematology evaluated and recommending some Nplate as well as Aransep.  They are recommending decreasing the amount of Amicar mouth rinse.  Cardiology evaluating and his echo did show some hypokinesis of the septum which is more prominent than his prior echo.  He is not a candidate at this time and no longer having chest pain and they are recommending continue to treat medically.  Patient does have a Streptococcus bacteremia and there seems to be relief of obstruction of the anterior cusp of the wall thickening.  Unfortunately patient cannot have a TEE secondary to severe thrombocytopenia and bleeding and cardiology may recommend continuing antibiotic treatments.  ID recommending 6 weeks of IV antibiotics and removal of his current Port-A-Cath.  We will continue IV ceftriaxone for now and can narrow to cefazolin based on sensitivities.  We will consult palliative care for further goals of care discussion  Assessment & Plan:   Principal Problem:   Chest pain Active Problems:   Essential hypertension   S/P aortic valve replacement with bioprosthetic valve    S/P CABG x 2   Hyponatremia   Pancytopenia (HCC)   MDS (myelodysplastic syndrome), high grade (HCC)   OSA on CPAP   Thrombocytopenia (HCC)   Acute blood loss anemia (ABLA)   Bacteremia due to Gram-positive bacteria  Chest pain with Dyspena  -Concerning for possible Type 2 non-ST elevation MI secondary to demand ischemia from severe anemia -Patient has a history of  coronary artery disease and is status post CABG x 2 -Twelve-lead EKG reviewed by me shows an incomplete left bundle branch block with nonspecific ST-T wave changes -Will cycle cardiac enzymes -Patient is unable to receive aspirin or anticoagulation due to severe  anemia requiring blood transfusion -Will obtain 2D echocardiogram to rule out regional wall motion abnormality; ECHO as below shows "There is severe hypokinesis of the septum that is out of proportion to postoperative septal movement. This  appears new compared with prior. The findings possibly suggest progression of disease proximal to LIMA-LAD  insertion. Left ventricular ejection fraction, by estimation, is 50 to 55%. The left ventricle has low normal function. The left ventricle demonstrates regional wall motion abnormalities (see scoring  diagram/findings for description). There is mild concentric left ventricular hypertrophy."  -We will transfuse 3 units of packed RBC for symptomatic anemia; Given Lasix with improvement  -Continue statins and reduced dose of beta-blocker to 12.5 mg twice daily -GI evaluated and they feel that given his significant hematuria and severe anemia as well as severe thrombocytopenia he would not be a good candidate for interventional measures and they evaluated and felt that his chest pain was mainly noticeable when he is laying down and did not occur with walk around.  They feel that his symptoms only happened after he received blood and after he was given IV Lasix yesterday he had not any further chest discomfort -Cardiology evaluated EKG and they saw significant ST depression in the setting of severe anemia -Cardiology feels patient had demand ischemia in the setting of pulmonary edema as well as severe anemia with hemoglobin 4.3 and recommending continue metoprolol tartrate but avoiding heparin, aspirin or other antiplatelet given severe thrombocytopenia -Cardiology recommended goal-directed medical therapy with Crestor and metoprolol but because the patient had a recent AKI he is off his ACE inhibitor -Not a Candidate for For Invasive Studies; No longer having Chest Pain and EF is only minimally reduced    Myelodysplastic Syndrome with Pancytopenia -Severe  anemia/thrombocytopenia with acute blood loss anemia secondary to hematuria, epistaxis and mucosal bleeding from his gums -Patient noted to have a hemoglobin of 4.3g/dl down from 7.4g/dl, 2 days ago -He also has platelet count of 6000 -Repeat labs yesterday show that he patient has a WBC of 0.9, hemoglobin/hematocrit of 8.2/23.9, and platelet count of 12 now -Now WBC is 0.7, Hgb/Hct is 8.0/23.5, and Platelet Count is 10 -We will transfuse 3 units of packed RBC as well as 2 packs of platelets  -Will consult hematology/oncology and Dr. Marin Olp consulted  -Pending patient's clinical course patient may be hospice appropriate and medical oncology does not feel that he has been appropriately responding to the chemotherapy -Oncology Trying Nplate and Aransep  -Consult Palliative for further GOC Discussion   Streptococcus Bacteremia -Blood Cx x2 showed GPC Streptococcus Species -Has a Hx of Streptococcus Bacteremia and Endocarditis (Has a Prosthetic Aortic Valve) -ECHO Done but he is not a good candidate for TEE Given Pancytopenia -He has a Port-A-Cath in place and this has been placed since January and there is concern for line infection and ID recommending considering line removal and depending on prognosis and what the plan is going forward if he chooses aggressive measures and not hospice he will need to consider port removal -ID has started the patient IV ceftriaxone and Repeat Blood Cx done -ID recommending that if the patient wants aggressive care that he will need 6 weeks of IV Abx and removal of his Port-a-Cath and continuing IV  Ceftriaxone for now   Gross Hematuria -Most likely secondary to severe thrombocytopenia -Monitor closely as it is improving    HTN -Continue low-dose beta-blocker -Continue to monitor blood pressure closely -Continue to monitor blood pressures per protocol -Last blood pressure reading was 121/62  Hypokalemia -Patient's Potassium is now 3.8 -Continue monitor  and replete as necessary -Mag level was 1.9 -Repeat CMP in the a.m.  DM2 -Maintain consistent carbohydrate diet -Continue with sliding scale insulin for glycemic control -CBG been ranging from 118-251; Glucose on CMP was 130  Hyponatremia -Patient's Na+ went from 127 -> 124 -> 128 -Continue to Monitor and Trend -Repeat CMP in the AM   DVT prophylaxis: SCDs Code Status: FULL CODE  Family Communication: Discussed with Wife at bedside  Disposition Plan: Pending further clinical Improvement   Status is: Inpatient  Remains inpatient appropriate because:IV treatments appropriate due to intensity of illness or inability to take PO and Inpatient level of care appropriate due to severity of illness  Dispo: The patient is from: Home              Anticipated d/c is to: Home              Patient currently is not medically stable to d/c.   Difficult to place patient No  Consultants:  Cardiology Medical Oncology ID Palliative Care Medicine   Procedures:  ECHOCARDIOGRAM  IMPRESSIONS     1. There is severe hypokinesis of the septum that is out of proportion to  postoperative septal movement. This appears new compared with prior. The  findings possibly suggest progression of disease proximal to LIMA-LAD  insertion. Left ventricular ejection   fraction, by estimation, is 50 to 55%. The left ventricle has low normal  function. The left ventricle demonstrates regional wall motion  abnormalities (see scoring diagram/findings for description). There is  mild concentric left ventricular hypertrophy.  Indeterminate diastolic filling due to E-A fusion.   2. Right ventricular systolic function is normal. The right ventricular  size is normal. There is normal pulmonary artery systolic pressure. The  estimated right ventricular systolic pressure is 123XX123 mmHg.   3. The mitral valve is grossly normal. Mild mitral valve regurgitation.  No evidence of mitral stenosis.   4. 25 mm Magna Ease  Pericardial bioprosthesis in the aortic position.  Vmax 2.2 m/s, MG 12 mmHG, EOA 1.92 cm2, DI 0.56. The leaflets appear  thickened but there is no increase in gradients or parameters to suggest  stenosis. The aortic valve has been  repaired/replaced. Aortic valve regurgitation is not visualized. Procedure  Date: 10/26/2017.   5. The inferior vena cava is normal in size with greater than 50%  respiratory variability, suggesting right atrial pressure of 3 mmHg.   Comparison(s): Changes from prior study are noted.   FINDINGS   Left Ventricle: There is severe hypokinesis of the septum that is out of  proportion to postoperative septal movement. This appears new compared  with prior. The findings possibly suggest progression of disease proximal  to LIMA-LAD insertion. Left  ventricular ejection fraction, by estimation, is 50 to 55%. The left  ventricle has low normal function. The left ventricle demonstrates  regional wall motion abnormalities. The left ventricular internal cavity  size was normal in size. There is mild  concentric left ventricular hypertrophy. Abnormal (paradoxical) septal  motion consistent with post-operative status. Indeterminate diastolic  filling due to E-A fusion.      LV Wall Scoring:  The mid and distal anterior septum  and inferior septum are hypokinetic.   Right Ventricle: The right ventricular size is normal. No increase in  right ventricular wall thickness. Right ventricular systolic function is  normal. There is normal pulmonary artery systolic pressure. The tricuspid  regurgitant velocity is 2.86 m/s, and   with an assumed right atrial pressure of 3 mmHg, the estimated right  ventricular systolic pressure is 123XX123 mmHg.   Left Atrium: Left atrial size was normal in size.   Right Atrium: Right atrial size was normal in size. Prominent Chiari  network.   Pericardium: There is no evidence of pericardial effusion.   Mitral Valve: The mitral valve is  grossly normal. Mild mitral valve  regurgitation. No evidence of mitral valve stenosis.   Tricuspid Valve: The tricuspid valve is grossly normal. Tricuspid valve  regurgitation is trivial. No evidence of tricuspid stenosis.   Aortic Valve: 25 mm Magna Ease Pericardial bioprosthesis in the aortic  position. Vmax 2.2 m/s, MG 12 mmHG, EOA 1.92 cm2, DI 0.56. The leaflets  appear thickened but there is no increase in gradients or parameters to  suggest stenosis. The aortic valve has  been repaired/replaced. Aortic valve regurgitation is not visualized.  Aortic valve mean gradient measures 13.0 mmHg. Aortic valve peak gradient  measures 19.4 mmHg. Aortic valve area, by VTI measures 1.92 cm. There is  a 25 mm [employer] Ease bovine  valve present in the aortic position.   Pulmonic Valve: The pulmonic valve was grossly normal. Pulmonic valve  regurgitation is trivial. No evidence of pulmonic stenosis.   Aorta: The aortic root and ascending aorta are structurally normal, with  no evidence of dilitation.   Venous: The inferior vena cava is normal in size with greater than 50%  respiratory variability, suggesting right atrial pressure of 3 mmHg.   IAS/Shunts: The atrial septum is grossly normal.      LEFT VENTRICLE  PLAX 2D  LVIDd:         4.90 cm      Diastology  LVIDs:         3.50 cm      LV e' medial:    6.53 cm/s  LV PW:         1.30 cm      LV E/e' medial:  16.4  LV IVS:        1.20 cm      LV e' lateral:   10.40 cm/s  LVOT diam:     2.10 cm      LV E/e' lateral: 10.3  LV SV:         76  LV SV Index:   37  LVOT Area:     3.46 cm     LV Volumes (MOD)  LV vol d, MOD A4C: 120.0 ml  LV vol s, MOD A4C: 50.2 ml  LV SV MOD A4C:     120.0 ml   RIGHT VENTRICLE  RV Basal diam:  3.20 cm  RV S prime:     9.03 cm/s  TAPSE (M-mode): 2.0 cm   LEFT ATRIUM             Index       RIGHT ATRIUM           Index  LA diam:        4.60 cm 2.23 cm/m  RA Area:     15.30 cm  LA Vol (A2C):    78.4 ml 38.04 ml/m RA Volume:   36.30 ml  17.62 ml/m  LA Vol (A4C):   53.1 ml 25.77 ml/m  LA Biplane Vol: 65.3 ml 31.69 ml/m   AORTIC VALVE  AV Area (Vmax):    1.79 cm  AV Area (Vmean):   1.54 cm  AV Area (VTI):     1.92 cm  AV Vmax:           220.33 cm/s  AV Vmean:          166.000 cm/s  AV VTI:            0.396 m  AV Peak Grad:      19.4 mmHg  AV Mean Grad:      13.0 mmHg  LVOT Vmax:         114.00 cm/s  LVOT Vmean:        73.700 cm/s  LVOT VTI:          0.220 m  LVOT/AV VTI ratio: 0.56     AORTA  Ao Root diam: 2.90 cm   MITRAL VALVE                TRICUSPID VALVE  MV Area (PHT): 3.93 cm     TR Peak grad:   32.7 mmHg  MV Decel Time: 193 msec     TR Vmax:        286.00 cm/s  MV E velocity: 107.00 cm/s  MV A velocity: 32.50 cm/s   SHUNTS  MV E/A ratio:  3.29         Systemic VTI:  0.22 m                              Systemic Diam: 2.10 cm   Antimicrobials:  Anti-infectives (From admission, onward)    Start     Dose/Rate Route Frequency Ordered Stop   07/08/21 1315  cefTRIAXone (ROCEPHIN) 2 g in sodium chloride 0.9 % 100 mL IVPB        2 g 200 mL/hr over 30 Minutes Intravenous Every 24 hours 07/08/21 1221     07/08/21 1000  famciclovir (FAMVIR) tablet 500 mg        500 mg Oral Daily 07/07/21 1521     07/08/21 1000  fluconazole (DIFLUCAN) tablet 100 mg        100 mg Oral Daily 07/07/21 1521     07/07/21 1330  vancomycin (VANCOREADY) IVPB 2000 mg/400 mL        2,000 mg 200 mL/hr over 120 Minutes Intravenous  Once 07/07/21 1256 07/07/21 1737   07/07/21 1300  ceFEPIme (MAXIPIME) 2 g in sodium chloride 0.9 % 100 mL IVPB        2 g 200 mL/hr over 30 Minutes Intravenous  Once 07/07/21 1255 07/07/21 1537        Subjective: Seen and examined at bedside and t has not had any further bleeding but his platelet count is on the lower side still.  Oncology will be trying Nplate and EPO. No lightheadedness or dizziness. Will likely need his port removed and will continue IV  Abx for now.   Objective: Vitals:   07/08/21 1230 07/08/21 2120 07/09/21 0519 07/09/21 1317  BP: 116/66 127/66 114/73 121/62  Pulse: 92 87 89 89  Resp: '16 18 17 18  '$ Temp: 98 F (36.7 C) 98.2 F (36.8 C) 98.6 F (37 C) 98.4 F (36.9 C)  TempSrc: Oral Oral Oral Oral  SpO2: 98% 98% 98% 100%  Weight:  Height:        Intake/Output Summary (Last 24 hours) at 07/09/2021 1731 Last data filed at 07/09/2021 1604 Gross per 24 hour  Intake 1180 ml  Output 2975 ml  Net -1795 ml    Filed Weights   07/07/21 1918  Weight: 90.2 kg   Examination: Physical Exam:  Constitutional: WN/WD alert overweight chronically ill-appearing Caucasian male currently in no acute distress appears calm sitting in chair bedside Eyes: Lids and conjunctivae normal, sclerae anicteric  ENMT: External Ears, Nose appear normal. Grossly normal hearing.  Neck: Appears normal, supple, no cervical masses, normal ROM, no appreciable thyromegaly; no JVD Respiratory: Diminished to auscultation bilaterally, no wheezing, rales, rhonchi or crackles. Normal respiratory effort and patient is not tachypenic. No accessory muscle use. Unlabored Breathing  Cardiovascular: RRR, no murmurs / rubs / gallops. S1 and S2 auscultated. Slight Lower Extremity edema Abdomen: Soft, non-tender, non-distended. Bowel sounds positive.  GU: Deferred. Musculoskeletal: No clubbing / cyanosis of digits/nails. No joint deformity upper and lower extremities.  Skin: No rashes, lesions, ulcers on a limited skin evaluation but has bruising and ecchymosis diffusely. No induration; Warm and dry.  Neurologic: CN 2-12 grossly intact with no focal deficits. Romberg sign and cerebellar reflexes not assessed.  Psychiatric: Normal judgment and insight. Alert and oriented x 3. Normal mood and appropriate affect.   Data Reviewed: I have personally reviewed following labs and imaging studies  CBC: Recent Labs  Lab 07/03/21 0512 07/05/21 1030  07/07/21 1106 07/08/21 0839 07/08/21 1138 07/09/21 0506  WBC 2.3* 1.9* 1.4* 1.1* 0.9* 0.7*  NEUTROABS 2.0 1.6* 1.1*  --  0.7* 0.3*  HGB 7.7* 7.4* 4.3* 8.5* 8.2* 8.0*  HCT 22.3* 21.4* 13.0* 24.7* 23.9* 23.5*  MCV 84.8 85.6 88.4 84.9 86.6 85.1  PLT 11* 6* 6* 8* 12* 10*    Basic Metabolic Panel: Recent Labs  Lab 07/05/21 1030 07/07/21 1106 07/08/21 0839 07/08/21 1138 07/09/21 0506  NA 124* 125* 127* 124* 128*  K 3.7 3.7 3.5 3.4* 3.8  CL 95* 96* 96* 93* 99  CO2 22 21* 21* 22 23  GLUCOSE 125* 140* 146* 195* 130*  BUN '20 17 18 19 15  '$ CREATININE 1.10 1.03 0.90 0.96 0.88  CALCIUM 9.0 8.2* 8.4* 8.4* 8.3*  MG  --   --   --  1.9 1.9  PHOS  --   --   --  4.0 3.2    GFR: Estimated Creatinine Clearance: 84.2 mL/min (by C-G formula based on SCr of 0.88 mg/dL). Liver Function Tests: Recent Labs  Lab 07/03/21 0512 07/05/21 1030 07/07/21 1106 07/08/21 1138 07/09/21 0506  AST 62* 36 36 38 44*  ALT 50* 42 39 39 44  ALKPHOS 62 69 55 54 58  BILITOT 1.4* 1.0 1.1 1.0 0.8  PROT 6.4* 7.1 6.8 7.0 6.9  ALBUMIN 3.1* 3.7 3.1* 3.4* 3.1*    No results for input(s): LIPASE, AMYLASE in the last 168 hours. No results for input(s): AMMONIA in the last 168 hours. Coagulation Profile: Recent Labs  Lab 07/08/21 1138  INR 1.2    Cardiac Enzymes: No results for input(s): CKTOTAL, CKMB, CKMBINDEX, TROPONINI in the last 168 hours. BNP (last 3 results) Recent Labs    12/09/20 1629  PROBNP 415*    HbA1C: No results for input(s): HGBA1C in the last 72 hours. CBG: Recent Labs  Lab 07/08/21 2140 07/09/21 0527 07/09/21 0717 07/09/21 1104 07/09/21 1640  GLUCAP 131* 133* 136* 251* 118*    Lipid Profile: No results  for input(s): CHOL, HDL, LDLCALC, TRIG, CHOLHDL, LDLDIRECT in the last 72 hours. Thyroid Function Tests: No results for input(s): TSH, T4TOTAL, FREET4, T3FREE, THYROIDAB in the last 72 hours. Anemia Panel: No results for input(s): VITAMINB12, FOLATE, FERRITIN, TIBC,  IRON, RETICCTPCT in the last 72 hours. Sepsis Labs: No results for input(s): PROCALCITON, LATICACIDVEN in the last 168 hours.  Recent Results (from the past 240 hour(s))  Blood culture (routine x 2)     Status: None (Preliminary result)   Collection Time: 07/07/21 12:40 PM   Specimen: BLOOD  Result Value Ref Range Status   Specimen Description   Final    BLOOD RIGHT ANTECUBITAL Performed at [facility] [address] [place], [place] [address]    Special Requests   Final    BOTTLES DRAWN AEROBIC AND ANAEROBIC Blood Culture adequate volume Performed at [facility] [address], [place], [place] [address]    Culture  Setup Time   Final    GRAM POSITIVE COCCI IN CHAINS IN BOTH AEROBIC AND ANAEROBIC BOTTLES CRITICAL VALUE NOTED.  VALUE IS CONSISTENT WITH PREVIOUSLY REPORTED AND CALLED VALUE.    Culture   Final    GRAM POSITIVE COCCI IDENTIFICATION TO FOLLOW Performed at [facility], [place] [address] [place], [place] [address]    Report Status PENDING  Incomplete  Blood culture (routine x 2)     Status: None (Preliminary result)   Collection Time: 07/07/21  1:20 PM   Specimen: BLOOD  Result Value Ref Range Status   Specimen Description   Final    BLOOD LEFT ANTECUBITAL Performed at [facility] [address], [place], [place] [address]    Special Requests   Final    BOTTLES DRAWN AEROBIC AND ANAEROBIC Blood Culture results may not be optimal due to an excessive volume of blood received in culture bottles Performed at [facility] [address] [place], [place] [address]    Culture  Setup Time   Final    GRAM POSITIVE COCCI IN CHAINS IN BOTH AEROBIC AND ANAEROBIC BOTTLES CRITICAL RESULT CALLED TO, READ BACK BY AND VERIFIED WITH: PHARMD MARY TUCKER 07/08/21'@10'$ :56 BY TW    Culture   Final    GRAM POSITIVE COCCI IDENTIFICATION TO FOLLOW Performed at [facility], [place] [address], [place], [place] [address]    Report Status PENDING  Incomplete  Blood Culture ID Panel (Reflexed)     Status: Abnormal   Collection Time: 07/07/21  1:20 PM  Result Value Ref Range Status   Enterococcus faecalis NOT DETECTED NOT DETECTED Final   Enterococcus Faecium NOT DETECTED NOT DETECTED Final   Listeria monocytogenes NOT DETECTED NOT DETECTED Final   Staphylococcus species NOT DETECTED NOT DETECTED Final   Staphylococcus aureus (BCID) NOT DETECTED NOT DETECTED Final   Staphylococcus epidermidis NOT DETECTED NOT DETECTED Final   Staphylococcus lugdunensis NOT DETECTED NOT DETECTED Final   Streptococcus species DETECTED (A) NOT DETECTED Final    Comment: Not Enterococcus species, Streptococcus agalactiae, Streptococcus pyogenes, or Streptococcus pneumoniae. CRITICAL RESULT CALLED TO, READ BACK BY AND VERIFIED WITH: PHARMD MARY TUCKER 07/08/21'@10'$ :55 BY TW    Streptococcus agalactiae NOT DETECTED NOT DETECTED Final   Streptococcus pneumoniae NOT DETECTED NOT DETECTED Final   Streptococcus pyogenes NOT DETECTED NOT DETECTED Final   A.calcoaceticus-baumannii NOT DETECTED NOT DETECTED Final   Bacteroides fragilis NOT DETECTED NOT DETECTED Final   Enterobacterales NOT DETECTED NOT DETECTED Final  Enterobacter cloacae complex NOT DETECTED NOT DETECTED Final   Escherichia coli NOT DETECTED NOT DETECTED Final   Klebsiella aerogenes NOT DETECTED NOT DETECTED Final   Klebsiella oxytoca NOT DETECTED NOT DETECTED Final   Klebsiella pneumoniae NOT DETECTED NOT DETECTED Final   Proteus species NOT DETECTED NOT DETECTED Final   Salmonella species NOT DETECTED NOT DETECTED Final   Serratia marcescens NOT DETECTED NOT DETECTED Final   Haemophilus influenzae NOT DETECTED NOT DETECTED Final   Neisseria meningitidis NOT DETECTED NOT DETECTED Final   Pseudomonas aeruginosa NOT DETECTED NOT DETECTED Final   Stenotrophomonas maltophilia NOT DETECTED NOT DETECTED Final   Candida albicans NOT DETECTED  NOT DETECTED Final   Candida auris NOT DETECTED NOT DETECTED Final   Candida glabrata NOT DETECTED NOT DETECTED Final   Candida krusei NOT DETECTED NOT DETECTED Final   Candida parapsilosis NOT DETECTED NOT DETECTED Final   Candida tropicalis NOT DETECTED NOT DETECTED Final   Cryptococcus neoformans/gattii NOT DETECTED NOT DETECTED Final    Comment: Performed at [facility], [place] [address] [place], [place] [address]     RN Pressure Injury Documentation:     Estimated body mass index is 29.36 kg/m as calculated from the following:   Height as of this encounter: '5\' 9"'$  (1.753 m).   Weight as of this encounter: 90.2 kg.  Malnutrition Type:  Nutrition Problem: Increased nutrient needs Etiology: chronic illness, cancer and cancer related treatments   Malnutrition Characteristics:  Signs/Symptoms: estimated needs   Nutrition Interventions:  Interventions: Premier Protein, MVI   Radiology Studies: ECHOCARDIOGRAM COMPLETE  Result Date: 07/08/2021    ECHOCARDIOGRAM REPORT   Patient Name:   CHARVIS GHOLAR [facility] Date of Exam: 07/08/2021 Medical Rec #:  [id number]   Height:       69.0 in Accession #:    [id number]  Weight:       198.8 lb Date of Birth:  21-Oct-1946  BSA:          2.061 m Patient Age:    70 years    BP:           137/78 mmHg Patient Gender: M           HR:           88 bpm. Exam Location:  Inpatient Procedure: 2D Echo, Cardiac Doppler and Color Doppler Indications:    Chest pain  History:        Patient has prior history of Echocardiogram examinations, most                 recent 12/11/2020. CAD, Prior CABG, Stroke and Carotid Disease,                 Signs/Symptoms:Chest Pain and Hematuria; Risk                 Factors:Hypertension, Diabetes, Dyslipidemia and Obesity.  Sonographer:    Dustin Flock Referring Phys: [id number] [place] AGBATA IMPRESSIONS  1. There is severe hypokinesis of the septum that is out of proportion to postoperative septal movement. This appears new  compared with prior. The findings possibly suggest progression of disease proximal to LIMA-LAD insertion. Left ventricular ejection  fraction, by estimation, is 50 to 55%. The left ventricle has low normal function. The left ventricle demonstrates regional wall motion abnormalities (see scoring diagram/findings for description). There is mild concentric left ventricular hypertrophy. Indeterminate diastolic filling due to E-A fusion.  2. Right ventricular systolic function is normal. The right ventricular  size is normal. There is normal pulmonary artery systolic pressure. The estimated right ventricular systolic pressure is 123XX123 mmHg.  3. The mitral valve is grossly normal. Mild mitral valve regurgitation. No evidence of mitral stenosis.  4. 25 mm Magna Ease Pericardial bioprosthesis in the aortic position. Vmax 2.2 m/s, MG 12 mmHG, EOA 1.92 cm2, DI 0.56. The leaflets appear thickened but there is no increase in gradients or parameters to suggest stenosis. The aortic valve has been repaired/replaced. Aortic valve regurgitation is not visualized. Procedure Date: 10/26/2017.  5. The inferior vena cava is normal in size with greater than 50% respiratory variability, suggesting right atrial pressure of 3 mmHg. Comparison(s): Changes from prior study are noted. FINDINGS  Left Ventricle: There is severe hypokinesis of the septum that is out of proportion to postoperative septal movement. This appears new compared with prior. The findings possibly suggest progression of disease proximal to LIMA-LAD insertion. Left ventricular ejection fraction, by estimation, is 50 to 55%. The left ventricle has low normal function. The left ventricle demonstrates regional wall motion abnormalities. The left ventricular internal cavity size was normal in size. There is mild concentric left ventricular hypertrophy. Abnormal (paradoxical) septal motion consistent with post-operative status. Indeterminate diastolic filling due to E-A fusion.   LV Wall Scoring: The mid and distal anterior septum and inferior septum are hypokinetic. Right Ventricle: The right ventricular size is normal. No increase in right ventricular wall thickness. Right ventricular systolic function is normal. There is normal pulmonary artery systolic pressure. The tricuspid regurgitant velocity is 2.86 m/s, and  with an assumed right atrial pressure of 3 mmHg, the estimated right ventricular systolic pressure is 123XX123 mmHg. Left Atrium: Left atrial size was normal in size. Right Atrium: Right atrial size was normal in size. Prominent Chiari network. Pericardium: There is no evidence of pericardial effusion. Mitral Valve: The mitral valve is grossly normal. Mild mitral valve regurgitation. No evidence of mitral valve stenosis. Tricuspid Valve: The tricuspid valve is grossly normal. Tricuspid valve regurgitation is trivial. No evidence of tricuspid stenosis. Aortic Valve: 25 mm Magna Ease Pericardial bioprosthesis in the aortic position. Vmax 2.2 m/s, MG 12 mmHG, EOA 1.92 cm2, DI 0.56. The leaflets appear thickened but there is no increase in gradients or parameters to suggest stenosis. The aortic valve has been repaired/replaced. Aortic valve regurgitation is not visualized. Aortic valve mean gradient measures 13.0 mmHg. Aortic valve peak gradient measures 19.4 mmHg. Aortic valve area, by VTI measures 1.92 cm. There is a 25 mm [employer] Ease bovine valve present in the aortic position. Pulmonic Valve: The pulmonic valve was grossly normal. Pulmonic valve regurgitation is trivial. No evidence of pulmonic stenosis. Aorta: The aortic root and ascending aorta are structurally normal, with no evidence of dilitation. Venous: The inferior vena cava is normal in size with greater than 50% respiratory variability, suggesting right atrial pressure of 3 mmHg. IAS/Shunts: The atrial septum is grossly normal.  LEFT VENTRICLE PLAX 2D LVIDd:         4.90 cm      Diastology LVIDs:         3.50 cm       LV e' medial:    6.53 cm/s LV PW:         1.30 cm      LV E/e' medial:  16.4 LV IVS:        1.20 cm      LV e' lateral:   10.40 cm/s LVOT diam:     2.10 cm  LV E/e' lateral: 10.3 LV SV:         76 LV SV Index:   37 LVOT Area:     3.46 cm  LV Volumes (MOD) LV vol d, MOD A4C: 120.0 ml LV vol s, MOD A4C: 50.2 ml LV SV MOD A4C:     120.0 ml RIGHT VENTRICLE RV Basal diam:  3.20 cm RV S prime:     9.03 cm/s TAPSE (M-mode): 2.0 cm LEFT ATRIUM             Index       RIGHT ATRIUM           Index LA diam:        4.60 cm 2.23 cm/m  RA Area:     15.30 cm LA Vol (A2C):   78.4 ml 38.04 ml/m RA Volume:   36.30 ml  17.62 ml/m LA Vol (A4C):   53.1 ml 25.77 ml/m LA Biplane Vol: 65.3 ml 31.69 ml/m  AORTIC VALVE AV Area (Vmax):    1.79 cm AV Area (Vmean):   1.54 cm AV Area (VTI):     1.92 cm AV Vmax:           220.33 cm/s AV Vmean:          166.000 cm/s AV VTI:            0.396 m AV Peak Grad:      19.4 mmHg AV Mean Grad:      13.0 mmHg LVOT Vmax:         114.00 cm/s LVOT Vmean:        73.700 cm/s LVOT VTI:          0.220 m LVOT/AV VTI ratio: 0.56  AORTA Ao Root diam: 2.90 cm MITRAL VALVE                TRICUSPID VALVE MV Area (PHT): 3.93 cm     TR Peak grad:   32.7 mmHg MV Decel Time: 193 msec     TR Vmax:        286.00 cm/s MV E velocity: 107.00 cm/s MV A velocity: 32.50 cm/s   SHUNTS MV E/A ratio:  3.29         Systemic VTI:  0.22 m                             Systemic Diam: 2.10 cm Eleonore Chiquito MD Electronically signed by Eleonore Chiquito MD Signature Date/Time: 07/08/2021/2:25:30 PM    Final     Scheduled Meds:  aminocaproic acid  10 mL Oral Q6H   aminocaproic acid  1,000 mg Oral Q8H   Chlorhexidine Gluconate Cloth  6 each Topical Daily   famciclovir  500 mg Oral Daily   fluconazole  100 mg Oral Daily   fluticasone  2 spray Each Nare Daily   gabapentin  600 mg Oral QHS   insulin aspart  0-15 Units Subcutaneous TID WC   loratadine  10 mg Oral q morning   metoprolol tartrate  12.5 mg Oral BID    multivitamin with minerals  1 tablet Oral q morning   pantoprazole  40 mg Oral Daily   polyethylene glycol  17 g Oral Daily   Ensure Max Protein  11 oz Oral BID   sodium chloride flush  10-40 mL Intracatheter Q12H   Continuous Infusions:  cefTRIAXone (ROCEPHIN)  IV 2 g (07/09/21 1311)    LOS: 2  days   Kerney Elbe, DO [facility] PAGER is on AMION  If 7PM-7AM, please contact night-coverage [email or web address]

## 2021-07-09 NOTE — Progress Notes (Signed)
    [facility] for Infectious [facility]   Reason for visit: Follow up on bacteremia  Interval History: remains afebrile, WBC 0.7.  No acute events. Cotninued severe thrombocytopenia.   Physical Exam: Constitutional:  Vitals:   07/09/21 0519 07/09/21 1317  BP: 114/73 121/62  Pulse: 89 89  Resp: 17 18  Temp: 98.6 F (37 C) 98.4 F (36.9 C)  SpO2: 98% 100%   patient appears in NAD  Impression/Plan: bacteremia.  Concern for endocarditis but not a candidate for TEE with pancytopenia.   If aggressive care warranted/desired, he will need 6 weeks of IV antibiotics and removal of his current port-a-cath.   Will continue with ceftriaxone for now, can narrow to penicillin based on sensitivities. Dr. Gale Journey available over the weekend if needed, otherwise Dr. Juleen China will follow on Monday.

## 2021-07-09 NOTE — Progress Notes (Signed)
Looks like he is growing gram-positive cocci in chains in the blood.  I suspect this might be Streptococcus.  We will have to await the results of the identification.  I think he is on Rocephin for this.  We will have to see what the sensitivities are.  His prealbumin is only 11.  This is quite troublesome.  I talked to he and his wife about this this morning.  I told him once the prealbumin gets below 10, then the body tends to slow down very quickly.  I think 1 problem with this bacteremia is the fact that he does have a I think prosthetic aortic valve.  I am not sure what the bacteria was when he had the endocarditis.  I have her cardiology is following this closely.  He had a echocardiogram done yesterday.  He has severe hypokinesis of the septum.  I think that the valves look okay.  His labs today show white cell count 0.7.  Hemoglobin 8.  Platelet count 10,000.  I will try him on some Nplate.  We will see if this might not help a little bit.  I will also try him on some Aranesp/Procrit.  His last erythropoietin level was only 27.  We can see if this may not get his hemoglobin up a little bit.  I know this is a very challenging situation.  I know he really has not responded to systemic therapy to date.  Again, the prealbumin is quite troublesome for me.  Having bacteremia also is a real problem.  I sincerely hope that this is not going to affect his prostatic heart valve.  He is not febrile.  This hopefully is a decent indicator.  The eschar on his lower lip is certainly much better.  I will decrease the frequency of the Amicar mouth rinse.  I probably will have to give him a dose of platelets today just to help maintain some form of hemostasis.  His PT and PTT are all okay.  Again, this is incredibly challenging.  This is the dark side of what myelodysplasia can do.  I do appreciate the outstanding care he is getting from the incredibly compassionate and hard-working staff up on [address].  I would really liberalize his diet now.  I think it is more poor for him to get calories and then to limit his cholesterol and salt intake.  I told his wife to bring food in for him.   Lattie Haw, MD  Psalm 24:8

## 2021-07-09 NOTE — Plan of Care (Signed)

## 2021-07-09 NOTE — Progress Notes (Addendum)
Progress Note  Patient Name: Theodore Jenkins Date of Encounter: 07/09/2021  [facility] Cardiologist: Lauree Chandler, MD   Subjective   Denies any CP or SOB. Aware of the new diagnosis of bacteremia.   Inpatient Medications    Scheduled Meds:  aminocaproic acid  10 mL Oral Q6H   aminocaproic acid  1,000 mg Oral Q8H   Chlorhexidine Gluconate Cloth  6 each Topical Daily   epoetin alfa  40,000 Units Subcutaneous Once   famciclovir  500 mg Oral Daily   fluconazole  100 mg Oral Daily   fluticasone  2 spray Each Nare Daily   gabapentin  600 mg Oral QHS   insulin aspart  0-15 Units Subcutaneous TID WC   loratadine  10 mg Oral q morning   metoprolol tartrate  12.5 mg Oral BID   multivitamin with minerals  1 tablet Oral q morning   pantoprazole  40 mg Oral Daily   polyethylene glycol  17 g Oral Daily   potassium chloride  40 mEq Oral BID   Ensure Max Protein  11 oz Oral BID   romiPLOStim  2 mcg/kg Subcutaneous Once   sodium chloride flush  10-40 mL Intracatheter Q12H   Continuous Infusions:  cefTRIAXone (ROCEPHIN)  IV 2 g (07/08/21 1356)   PRN Meds: acetaminophen, LORazepam, nitroGLYCERIN, ondansetron **OR** ondansetron (ZOFRAN) IV, sodium chloride flush   Vital Signs    Vitals:   07/08/21 1006 07/08/21 1230 07/08/21 2120 07/09/21 0519  BP: 110/60 116/66 127/66 114/73  Pulse: 80 92 87 89  Resp: '16 16 18 17  '$ Temp: 98.8 F (37.1 C) 98 F (36.7 C) 98.2 F (36.8 C) 98.6 F (37 C)  TempSrc: Oral Oral Oral Oral  SpO2: 100% 98% 98% 98%  Weight:      Height:        Intake/Output Summary (Last 24 hours) at 07/09/2021 0758 Last data filed at 07/09/2021 0234 Gross per 24 hour  Intake 1744 ml  Output 3440 ml  Net -1696 ml   Last 3 Weights 07/07/2021 06/28/2021 06/08/2021  Weight (lbs) 198 lb 12.8 oz 206 lb 2.1 oz 213 lb  Weight (kg) 90.175 kg 93.5 kg 96.616 kg      Telemetry    NSR without significant ST-T wave changes - Personally Reviewed  ECG    NSR without  significant ST-T wave changes 7/28 - Personally Reviewed  Physical Exam   GEN: No acute distress.   Neck: No JVD Cardiac: RRR, no murmurs, rubs, or gallops.  Respiratory: Clear to auscultation bilaterally. GI: Soft, nontender, non-distended  MS: No edema; No deformity. Neuro:  Nonfocal  Psych: Normal affect   Labs    High Sensitivity Troponin:   Recent Labs  Lab 07/07/21 1106 07/07/21 1321 07/07/21 1711 07/07/21 1909  TROPONINIHS 446* 483* 486* 499*      Chemistry Recent Labs  Lab 07/07/21 1106 07/08/21 0839 07/08/21 1138 07/09/21 0506  NA 125* 127* 124* 128*  K 3.7 3.5 3.4* 3.8  CL 96* 96* 93* 99  CO2 21* 21* 22 23  GLUCOSE 140* 146* 195* 130*  BUN '17 18 19 15  '$ CREATININE 1.03 0.90 0.96 0.88  CALCIUM 8.2* 8.4* 8.4* 8.3*  PROT 6.8  --  7.0 6.9  ALBUMIN 3.1*  --  3.4* 3.1*  AST 36  --  38 44*  ALT 39  --  39 44  ALKPHOS 55  --  54 58  BILITOT 1.1  --  1.0 0.8  GFRNONAA >  60 >60 >60 >60  ANIONGAP '8 10 9 6     '$ Hematology Recent Labs  Lab 07/08/21 0839 07/08/21 1138 07/09/21 0506  WBC 1.1* 0.9* 0.7*  RBC 2.91* 2.76* 2.76*  HGB 8.5* 8.2* 8.0*  HCT 24.7* 23.9* 23.5*  MCV 84.9 86.6 85.1  MCH 29.2 29.7 29.0  MCHC 34.4 34.3 34.0  RDW 15.8* 16.1* 16.0*  PLT 8* 12* 10*    BNP Recent Labs  Lab 07/07/21 1106  BNP 825.3*     DDimer No results for input(s): DDIMER in the last 168 hours.   Radiology    DG Chest 2 View  Result Date: 07/07/2021 CLINICAL DATA:  75 year old male with chest pressure and fatigue for 1 week. Pancytopenia. EXAM: CHEST - 2 VIEW COMPARISON:  Chest radiographs 07/01/2021 and earlier. FINDINGS: PA and lateral views today. Stable right chest power port. Prior sternotomy and cardiac valve replacement. Mediastinal contours are stable and within normal limits. Regressed pulmonary interstitial opacity from earlier this month. The left lung now appears clear. No pneumothorax or layering pleural fluid. However, there is indistinct right  middle lobe opacity on both views, which persists from earlier this month. No acute osseous abnormality identified. Negative visible bowel gas pattern. IMPRESSION: 1. Confluent but indistinct right middle lobe opacity not improved from 6 days ago. This is nonspecific, but consider right middle lobe pneumonia. 2. Otherwise pulmonary interstitial edema appears resolved from earlier this month. No new cardiopulmonary abnormality. Electronically Signed   By: Genevie Ann M.D.   On: 07/07/2021 10:27   CT CHEST WO CONTRAST  Result Date: 07/07/2021 CLINICAL DATA:  Pneumonia EXAM: CT CHEST WITHOUT CONTRAST TECHNIQUE: Multidetector CT imaging of the chest was performed following the standard protocol without IV contrast. COMPARISON:  CT chest abdomen pelvis, 12/11/2020 FINDINGS: Cardiovascular: Right chest port catheter. Aortic atherosclerosis. Aortic valve prosthesis. Cardiomegaly. Extensive 3 vessel coronary artery calcifications and/or stents. No pericardial effusion. Mediastinum/Nodes: Newly enlarged right hilar, subcarinal, pretracheal, and high right paratracheal lymph nodes, largest right paratracheal node measuring 1.4 x 1.2 cm (series 2, image 27). Thyroid gland, trachea, and esophagus demonstrate no significant findings. Lungs/Pleura: Small bilateral pleural effusions and associated atelectasis or consolidation. Interlobular septal thickening throughout the lungs. There is heterogeneous airspace opacity and consolidation of the lateral segment right middle lobe (series 4, image 75). There are numerous new bilateral pulmonary nodules of varying sizes, for example a 1.7 x 1.5 cm nodule of the left apex (series 4, image 20), a 0.7 cm nodule of the right upper lobe (series 4, image 44), and a 1.4 x 1.3 cm nodule of the peripheral left upper lobe (series 4, image 43). Upper Abdomen: No acute abnormality. Musculoskeletal: No chest wall mass or suspicious bone lesions identified. IMPRESSION: 1. There is heterogeneous  airspace opacity and consolidation of the lateral segment right middle lobe , consistent with infection or aspiration. 2. There are numerous new bilateral pulmonary nodules of varying sizes, which are generally nonspecific, possibly infectious or inflammatory in nature although concerning for pulmonary metastatic disease. 3. Newly enlarged mediastinal lymph nodes, which may be reactive to infection, although as above, malignancy is difficult to exclude. 4. Small bilateral pleural effusions and associated atelectasis or consolidation. 5. Interlobular septal thickening throughout the lungs, consistent with pulmonary edema. 6. Cardiomegaly and coronary artery disease. Aortic Atherosclerosis (ICD10-I70.0). Electronically Signed   By: Eddie Candle M.D.   On: 07/07/2021 15:50   ECHOCARDIOGRAM COMPLETE  Result Date: 07/08/2021    ECHOCARDIOGRAM REPORT   Patient Name:  Theodore Jenkins [facility] Date of Exam: 07/08/2021 Medical Rec #:  [id number]   Height:       69.0 in Accession #:    [id number]  Weight:       198.8 lb Date of Birth:  1946-06-17  BSA:          2.061 m Patient Age:    53 years    BP:           137/78 mmHg Patient Gender: M           HR:           88 bpm. Exam Location:  Inpatient Procedure: 2D Echo, Cardiac Doppler and Color Doppler Indications:    Chest pain  History:        Patient has prior history of Echocardiogram examinations, most                 recent 12/11/2020. CAD, Prior CABG, Stroke and Carotid Disease,                 Signs/Symptoms:Chest Pain and Hematuria; Risk                 Factors:Hypertension, Diabetes, Dyslipidemia and Obesity.  Sonographer:    Dustin Flock Referring Phys: [id number] [place] AGBATA IMPRESSIONS  1. There is severe hypokinesis of the septum that is out of proportion to postoperative septal movement. This appears new compared with prior. The findings possibly suggest progression of disease proximal to LIMA-LAD insertion. Left ventricular ejection  fraction, by estimation, is 50 to  55%. The left ventricle has low normal function. The left ventricle demonstrates regional wall motion abnormalities (see scoring diagram/findings for description). There is mild concentric left ventricular hypertrophy. Indeterminate diastolic filling due to E-A fusion.  2. Right ventricular systolic function is normal. The right ventricular size is normal. There is normal pulmonary artery systolic pressure. The estimated right ventricular systolic pressure is 123XX123 mmHg.  3. The mitral valve is grossly normal. Mild mitral valve regurgitation. No evidence of mitral stenosis.  4. 25 mm Magna Ease Pericardial bioprosthesis in the aortic position. Vmax 2.2 m/s, MG 12 mmHG, EOA 1.92 cm2, DI 0.56. The leaflets appear thickened but there is no increase in gradients or parameters to suggest stenosis. The aortic valve has been repaired/replaced. Aortic valve regurgitation is not visualized. Procedure Date: 10/26/2017.  5. The inferior vena cava is normal in size with greater than 50% respiratory variability, suggesting right atrial pressure of 3 mmHg. Comparison(s): Changes from prior study are noted. FINDINGS  Left Ventricle: There is severe hypokinesis of the septum that is out of proportion to postoperative septal movement. This appears new compared with prior. The findings possibly suggest progression of disease proximal to LIMA-LAD insertion. Left ventricular ejection fraction, by estimation, is 50 to 55%. The left ventricle has low normal function. The left ventricle demonstrates regional wall motion abnormalities. The left ventricular internal cavity size was normal in size. There is mild concentric left ventricular hypertrophy. Abnormal (paradoxical) septal motion consistent with post-operative status. Indeterminate diastolic filling due to E-A fusion.  LV Wall Scoring: The mid and distal anterior septum and inferior septum are hypokinetic. Right Ventricle: The right ventricular size is normal. No increase in right  ventricular wall thickness. Right ventricular systolic function is normal. There is normal pulmonary artery systolic pressure. The tricuspid regurgitant velocity is 2.86 m/s, and  with an assumed right atrial pressure of 3 mmHg, the estimated right ventricular systolic pressure is 123XX123 mmHg. Left Atrium:  Left atrial size was normal in size. Right Atrium: Right atrial size was normal in size. Prominent Chiari network. Pericardium: There is no evidence of pericardial effusion. Mitral Valve: The mitral valve is grossly normal. Mild mitral valve regurgitation. No evidence of mitral valve stenosis. Tricuspid Valve: The tricuspid valve is grossly normal. Tricuspid valve regurgitation is trivial. No evidence of tricuspid stenosis. Aortic Valve: 25 mm Magna Ease Pericardial bioprosthesis in the aortic position. Vmax 2.2 m/s, MG 12 mmHG, EOA 1.92 cm2, DI 0.56. The leaflets appear thickened but there is no increase in gradients or parameters to suggest stenosis. The aortic valve has been repaired/replaced. Aortic valve regurgitation is not visualized. Aortic valve mean gradient measures 13.0 mmHg. Aortic valve peak gradient measures 19.4 mmHg. Aortic valve area, by VTI measures 1.92 cm. There is a 25 mm [employer] Ease bovine valve present in the aortic position. Pulmonic Valve: The pulmonic valve was grossly normal. Pulmonic valve regurgitation is trivial. No evidence of pulmonic stenosis. Aorta: The aortic root and ascending aorta are structurally normal, with no evidence of dilitation. Venous: The inferior vena cava is normal in size with greater than 50% respiratory variability, suggesting right atrial pressure of 3 mmHg. IAS/Shunts: The atrial septum is grossly normal.  LEFT VENTRICLE PLAX 2D LVIDd:         4.90 cm      Diastology LVIDs:         3.50 cm      LV e' medial:    6.53 cm/s LV PW:         1.30 cm      LV E/e' medial:  16.4 LV IVS:        1.20 cm      LV e' lateral:   10.40 cm/s LVOT diam:     2.10 cm       LV E/e' lateral: 10.3 LV SV:         76 LV SV Index:   37 LVOT Area:     3.46 cm  LV Volumes (MOD) LV vol d, MOD A4C: 120.0 ml LV vol s, MOD A4C: 50.2 ml LV SV MOD A4C:     120.0 ml RIGHT VENTRICLE RV Basal diam:  3.20 cm RV S prime:     9.03 cm/s TAPSE (M-mode): 2.0 cm LEFT ATRIUM             Index       RIGHT ATRIUM           Index LA diam:        4.60 cm 2.23 cm/m  RA Area:     15.30 cm LA Vol (A2C):   78.4 ml 38.04 ml/m RA Volume:   36.30 ml  17.62 ml/m LA Vol (A4C):   53.1 ml 25.77 ml/m LA Biplane Vol: 65.3 ml 31.69 ml/m  AORTIC VALVE AV Area (Vmax):    1.79 cm AV Area (Vmean):   1.54 cm AV Area (VTI):     1.92 cm AV Vmax:           220.33 cm/s AV Vmean:          166.000 cm/s AV VTI:            0.396 m AV Peak Grad:      19.4 mmHg AV Mean Grad:      13.0 mmHg LVOT Vmax:         114.00 cm/s LVOT Vmean:        73.700 cm/s LVOT VTI:  0.220 m LVOT/AV VTI ratio: 0.56  AORTA Ao Root diam: 2.90 cm MITRAL VALVE                TRICUSPID VALVE MV Area (PHT): 3.93 cm     TR Peak grad:   32.7 mmHg MV Decel Time: 193 msec     TR Vmax:        286.00 cm/s MV E velocity: 107.00 cm/s MV A velocity: 32.50 cm/s   SHUNTS MV E/A ratio:  3.29         Systemic VTI:  0.22 m                             Systemic Diam: 2.10 cm Eleonore Chiquito MD Electronically signed by Eleonore Chiquito MD Signature Date/Time: 07/08/2021/2:25:30 PM    Final     Cardiac Studies   Echo 07/08/2021 . There is severe hypokinesis of the septum that is out of proportion to  postoperative septal movement. This appears new compared with prior. The  findings possibly suggest progression of disease proximal to LIMA-LAD  insertion. Left ventricular ejection   fraction, by estimation, is 50 to 55%. The left ventricle has low normal  function. The left ventricle demonstrates regional wall motion  abnormalities (see scoring diagram/findings for description). There is  mild concentric left ventricular hypertrophy.  Indeterminate diastolic  filling due to E-A fusion.   2. Right ventricular systolic function is normal. The right ventricular  size is normal. There is normal pulmonary artery systolic pressure. The  estimated right ventricular systolic pressure is 123XX123 mmHg.   3. The mitral valve is grossly normal. Mild mitral valve regurgitation.  No evidence of mitral stenosis.   4. 25 mm Magna Ease Pericardial bioprosthesis in the aortic position.  Vmax 2.2 m/s, MG 12 mmHG, EOA 1.92 cm2, DI 0.56. The leaflets appear  thickened but there is no increase in gradients or parameters to suggest  stenosis. The aortic valve has been  repaired/replaced. Aortic valve regurgitation is not visualized. Procedure  Date: 10/26/2017.   5. The inferior vena cava is normal in size with greater than 50%  respiratory variability, suggesting right atrial pressure of 3 mmHg.   Comparison(s): Changes from prior study are noted.   Patient Profile     75 y.o. male with PMH of CAD s/p 2v CABG (LIMA-LAD, SVG-OM) 2018, bioprosthetic AVR at the time of bypass, history of CVA, carotid artery disease s/p R CEA and eventual occlusion of the right carotid artery, AAA, HTN, HLD, DM II and significant myelodysplastic disorder presented with chest pain. Symptom is more noticeable laying down when compare to physical exertion. Improved after diuresis. Echo showed septal hypokinesis. Blood culture   Assessment & Plan    Chest pain  - symptom occurred in the setting of volume overload and severe anemia  - no further chest pain after diuresis  - ST changes in the anterior leads improved with blood and platelet transfusion  - serial trop in the 400s range on multiple check, suspect demand ischemia in the setting pulmonary edema  - MD to review echo image, EF 50-55%, severe hypokinesis of septum that is out of proportion to postoperative septal movement, finding possibly suggest progression of disease prox to LIMA-LAD insertion. RVSP 35.7 mmHg, mild MR. Patient is  not a candidate for invasive study given unable to tolerate antiplatelet therapy. Likely medical management.  Bacteremia: blood culture grew streptococcus species  - previous blood  culture from 1 year ago (May 2021) also grew strep species. TEE at the time revealed thickened bioprosthetic valve leaflet. Treated with IV abx. Repeat echo in Aug 2021 continue to show thickening of the wall of the leaflet of bioprosthetic valve but no aortic insufficiency or aortic stenosis  - not a candidate for TEE again given thrombocytopenia. Agree with Dr. Linus Salmons of infectious disease, continue abx and observe  Severe thrombocytopenia with hematuria       -Received 3 units of packed red blood cell and 1 unit of platelet transfusion.   Severe anemia: Related to #2 and myelodysplastic disorder       -Recurrent hematuria in the setting of severe thrombocytopenia   Myelodysplastic disorder: Followed by oncology service       -According to oncology note, patient has had very little response to chemotherapy so far.  CAD s/p CABG x2 in 2018: See #1.  Not on aspirin given the severe thrombocytopenia and high risk for bleeding.   History of bioprosthetic AVR: Stable on last echocardiogram.    History of CVA   History of carotid artery disease with occluded right ICA   AAA   Hypertension: Continue on metoprolol.  Home lisinopril was discontinued during last admission due to AKI   Hyperlipidemia: Continue on Crestor   DM2      For questions or updates, please contact [place] Please consult [email or web address] for contact info under        Signed, Almyra Deforest, [place]  07/09/2021, 7:58 AM    Personally seen and examined. Agree with above.  75 year old with myelodysplastic disorder, pancytopenia, CAD post CABG in 2018x2 with bioprosthetic AVR, strep species positive blood culture.  Denies any chest pain shortness of breath.  On exam petechiae noted.  Heart regular rate and rhythm with 2/6 systolic  murmur.  Troponin flat at 480.  Serum sodium 128 creatinine 0.88 White count 0.7 hemoglobin 8.0 platelet count 10  Assessment and plan:  Chest pain - Prior episode occurred in the setting of severe anemia with volume overload.  Received IV Lasix and felt much better.  No further chest pain.  Serial troponins have been in the 400-500 range on multiple checks, flat.  This does not look like acute coronary syndrome.  Elevated troponin is secondary to demand ischemia in the setting of current concomitant illness. -Echocardiogram shows EF approximately 50% with hypokinesis of the septum more prominent than on prior.  Echo report suggested possible disease LIMA to LAD insertion perhaps.  Obviously not an invasive candidate at this time and he is no longer having chest pain and his ejection fraction is only minimally reduced.  Continue to treat with medical management to the best of our abilities.  Bacteremia - Strep species, ID involved, last year had thickening of his aortic valve, bioprosthesis, gradients are the same as previous on echocardiogram personally reviewed.  Thickening is noted.  Seems to be leaflet restriction of the anterior cusp with thickening.  Unfortunately, cannot have a transesophageal echocardiogram/TEE secondary to severe thrombocytopenia and risk of bleeding.  Previously last year he was treated with 6 weeks of ceftriaxone and gentamicin.  ID currently following and recommendations may be to continue with IV antibiotics for another 6 weeks.  He is not a surgical candidate.  Severe thrombocytopenia, hematuria, pancytopenia - 3 units of packed red blood cells 1 units of platelets during this admission. -Secondary to myelodysplastic disorder.  Dr. Marin Olp with oncology hematology on board.  Candee Furbish, MD

## 2021-07-10 DIAGNOSIS — D649 Anemia, unspecified: Secondary | ICD-10-CM | POA: Diagnosis not present

## 2021-07-10 DIAGNOSIS — D46Z Other myelodysplastic syndromes: Secondary | ICD-10-CM

## 2021-07-10 DIAGNOSIS — D696 Thrombocytopenia, unspecified: Secondary | ICD-10-CM | POA: Diagnosis not present

## 2021-07-10 DIAGNOSIS — R079 Chest pain, unspecified: Secondary | ICD-10-CM | POA: Diagnosis not present

## 2021-07-10 DIAGNOSIS — I214 Non-ST elevation (NSTEMI) myocardial infarction: Secondary | ICD-10-CM

## 2021-07-10 DIAGNOSIS — D469 Myelodysplastic syndrome, unspecified: Secondary | ICD-10-CM | POA: Diagnosis not present

## 2021-07-10 DIAGNOSIS — R7881 Bacteremia: Secondary | ICD-10-CM | POA: Diagnosis not present

## 2021-07-10 LAB — MAGNESIUM: Magnesium: 2 mg/dL (ref 1.7–2.4)

## 2021-07-10 LAB — COMPREHENSIVE METABOLIC PANEL
ALT: 63 U/L — ABNORMAL HIGH (ref 0–44)
AST: 62 U/L — ABNORMAL HIGH (ref 15–41)
Albumin: 3.2 g/dL — ABNORMAL LOW (ref 3.5–5.0)
Alkaline Phosphatase: 61 U/L (ref 38–126)
Anion gap: 6 (ref 5–15)
BUN: 18 mg/dL (ref 8–23)
CO2: 23 mmol/L (ref 22–32)
Calcium: 8.3 mg/dL — ABNORMAL LOW (ref 8.9–10.3)
Chloride: 97 mmol/L — ABNORMAL LOW (ref 98–111)
Creatinine, Ser: 0.82 mg/dL (ref 0.61–1.24)
GFR, Estimated: >60 mL/min (ref >60)
Glucose, Bld: 133 mg/dL — ABNORMAL HIGH (ref 70–99)
Potassium: 4 mmol/L (ref 3.5–5.1)
Sodium: 126 mmol/L — ABNORMAL LOW (ref 135–145)
Total Bilirubin: 0.9 mg/dL (ref 0.3–1.2)
Total Protein: 6.9 g/dL (ref 6.5–8.1)

## 2021-07-10 LAB — GLUCOSE, CAPILLARY
Glucose-Capillary: 138 mg/dL — ABNORMAL HIGH (ref 70–99)
Glucose-Capillary: 147 mg/dL — ABNORMAL HIGH (ref 70–99)
Glucose-Capillary: 161 mg/dL — ABNORMAL HIGH (ref 70–99)
Glucose-Capillary: 152 mg/dL — ABNORMAL HIGH (ref 70–99)
Glucose-Capillary: 234 mg/dL — ABNORMAL HIGH (ref 70–99)

## 2021-07-10 LAB — CBC WITH DIFFERENTIAL/PLATELET
Abs Immature Granulocytes: 0.05 10*3/uL (ref 0.00–0.07)
Basophils Absolute: 0 10*3/uL (ref 0.0–0.1)
Basophils Relative: 0 %
Eosinophils Absolute: 0 10*3/uL (ref 0.0–0.5)
Eosinophils Relative: 0 %
HCT: 22.6 % — ABNORMAL LOW (ref 39.0–52.0)
Hemoglobin: 7.7 g/dL — ABNORMAL LOW (ref 13.0–17.0)
Immature Granulocytes: 7 %
Lymphocytes Relative: 35 %
Lymphs Abs: 0.3 10*3/uL — ABNORMAL LOW (ref 0.7–4.0)
MCH: 29.6 pg (ref 26.0–34.0)
MCHC: 34.1 g/dL (ref 30.0–36.0)
MCV: 86.9 fL (ref 80.0–100.0)
Monocytes Absolute: 0.1 10*3/uL (ref 0.1–1.0)
Monocytes Relative: 8 %
Neutro Abs: 0.4 10*3/uL — CL (ref 1.7–7.7)
Neutrophils Relative %: 50 %
Platelets: 11 10*3/uL — CL (ref 150–400)
RBC: 2.6 MIL/uL — ABNORMAL LOW (ref 4.22–5.81)
RDW: 16.4 % — ABNORMAL HIGH (ref 11.5–15.5)
WBC: 0.8 10*3/uL — CL (ref 4.0–10.5)
nRBC: 5.2 % — ABNORMAL HIGH (ref 0.0–0.2)

## 2021-07-10 LAB — PHOSPHORUS: Phosphorus: 3.8 mg/dL (ref 2.5–4.6)

## 2021-07-10 NOTE — Plan of Care (Signed)

## 2021-07-10 NOTE — Evaluation (Signed)
Physical Therapy Evaluation Patient Details Name: Theodore Jenkins MRN: [id number] DOB: Dec 10, 1946 Today's Date: 07/10/2021   History of Present Illness  Pt is 75 yo male who presented on 07/07/21 with chest pain and found to have demand NSTEMI in setting of anemia and volume overload.  Pt with hx of significant myelodysplastic disorder requring blood transfusions, platelet transfusions, and chemo.  Pt with recent admission (last week) with epistasis and thrombocytopenia. Additional hx of CAD s/p CABG x2, bioprosthetic AVR, CVA, R CEA, AAA, HTN, HLD, and DM2.  Clinical Impression  Pt admitted with above diagnosis. At baseline, pt ambulated with AD - distance varied based on lab values/when he had transfusions.  He has support from wife and children and DME at home.  Today, pt ambulated 120' with RW and min guard - reports feeling better than he has in a week.  Not recommending therapy follow up at this time due to pt/wife report difficulty making appointments when he has so many other appointments for his MDS that aren't always predictable.  They have HEPs at home from other PT sessions.  Pt currently with functional limitations due to the deficits listed below (see PT Problem List). Pt will benefit from skilled PT to increase their independence and safety with mobility to allow discharge to the venue listed below.       Follow Up Recommendations No PT follow up    Equipment Recommendations  None recommended by PT    Recommendations for Other Services       Precautions / Restrictions Precautions Precautions: Fall Precaution Comments: Low platelets, WBC Restrictions Weight Bearing Restrictions: No      Mobility  Bed Mobility               General bed mobility comments: Up in chair at arrival    Transfers Overall transfer level: Needs assistance Equipment used: Rolling walker (2 wheeled) Transfers: Sit to/from Stand Sit to Stand: Min guard         General transfer comment:  min guard for sfaety  Ambulation/Gait Ambulation/Gait assistance: Min guard Gait Distance (Feet): 120 Feet Assistive device: Rolling walker (2 wheeled) Gait Pattern/deviations: Step-through pattern;Decreased stride length Gait velocity: decrased   General Gait Details: Min guard for safety with low platelets; cues for RW proximity (pt typically uses rollator); slow but steady speed  Science writer    Modified Rankin (Stroke Patients Only)       Balance Overall balance assessment: Needs assistance Sitting-balance support: No upper extremity supported;Feet supported Sitting balance-Leahy Scale: Good     Standing balance support: Bilateral upper extremity supported;During functional activity;No upper extremity supported Standing balance-Leahy Scale: Fair Standing balance comment: RW to ambulate but could static stand without AD                             Pertinent Vitals/Pain Pain Assessment: No/denies pain    Home Living Family/patient expects to be discharged to:: Private residence Living Arrangements: Spouse/significant other Available Help at Discharge: Family;Available 24 hours/day Type of Home: House Home Access: Stairs to enter   [employer] of Steps: 1+1 at front without rails, 4-5 on back with railings Home Layout: Two level;Able to live on main level with bedroom/bathroom;Laundry or work area in [place]: Environmental consultant - 2 wheels;Cane - single point;Shower seat;Walker - 4 wheels;Wheelchair - manual;Grab bars - tub/shower;Grab bars - toilet  Prior Function           Comments: Use of cane vs walker for mobility (could ambulate in community after blood transfusions), Able to complete ADLs without assist. Wife has been completing laundry in basement area recently.  Pt had several falls in June when lab values were low but also long as blood count is ok he is able to mobilize.  Pt had started some  outpt PT at [facility] but was having difficulty getting to appointments with his weakness and all of his other appointment. s     Hand Dominance   Dominant Hand: Right    Extremity/Trunk Assessment   Upper Extremity Assessment Upper Extremity Assessment: Overall WFL for tasks assessed    Lower Extremity Assessment Lower Extremity Assessment: Overall WFL for tasks assessed (MMT at least 3/5 throughout but did not MMT further due to low platelets)    Cervical / Trunk Assessment Cervical / Trunk Assessment: Normal  Communication   Communication: No difficulties  Cognition Arousal/Alertness: Awake/alert Behavior During Therapy: WFL for tasks assessed/performed Overall Cognitive Status: Within Functional Limits for tasks assessed                                        General Comments General comments (skin integrity, edema, etc.): Pt's wife and daughter present.  Discussed PT role and POC with pt and family.  Discussed recommendation for home with family.  Also, talked about HHPT or outpt PT - they report with all of their other appointments for infusions/treatments that planning more appointments is really difficult and pt is not homebound.  Pt has HEPs at home and T-band from prior therapy.  They have access to outpt PT and aware they can request order if needed.  DIscussed HEP of AROM exercises in sitting and sit to stand to strengthen.  VSS during session    Exercises     Assessment/Plan    PT Assessment Patient needs continued PT services  PT Problem List Decreased strength;Decreased activity tolerance;Decreased balance;Decreased mobility;Decreased knowledge of use of DME;Cardiopulmonary status limiting activity       PT Treatment Interventions DME instruction;Gait training;Stair training;Functional mobility training;Therapeutic activities;Therapeutic exercise;Patient/family education;Balance training    PT Goals (Current goals can be found in the Care  Plan section)  Acute Rehab PT Goals Patient Stated Goal: return home when able PT Goal Formulation: With patient/family Time For Goal Achievement: 07/24/21 Potential to Achieve Goals: Good    Frequency Min 3X/week   Barriers to discharge        Co-evaluation               AM-PAC PT "6 Clicks" Mobility  Outcome Measure Help needed turning from your back to your side while in a flat bed without using bedrails?: None Help needed moving from lying on your back to sitting on the side of a flat bed without using bedrails?: A Little Help needed moving to and from a bed to a chair (including a wheelchair)?: A Little Help needed standing up from a chair using your arms (e.g., wheelchair or bedside chair)?: A Little Help needed to walk in hospital room?: A Little Help needed climbing 3-5 steps with a railing? : A Little 6 Click Score: 19    End of Session Equipment Utilized During Treatment: Gait belt Activity Tolerance: Patient tolerated treatment well Patient left: in chair;with call bell/phone within reach;with family/visitor present  Nurse Communication: Mobility status PT Visit Diagnosis: Difficulty in walking, not elsewhere classified (R26.2);Muscle weakness (generalized) (M62.81)    Time: [id number] PT Time Calculation (min) (ACUTE ONLY): 21 min   Charges:   PT Evaluation $PT Eval Low Complexity: 1 Low          Slyvia Lartigue, PT Acute Rehab Services Pager [phone] [facility] [phone]   Karlton Lemon 07/10/2021, 12:11 PM

## 2021-07-10 NOTE — Progress Notes (Signed)
PROGRESS NOTE    Theodore Jenkins  [id number] DOB: 02-03-1946 DOA: 07/07/2021 PCP: Lezlie Octave, PA-C   Brief Narrative:  The patient is a 75 year old Caucasian overweight male with a past medical history significant for but not limited to myelodysplastic syndrome, history of CAD status post CABG, history of diabetes mellitus type 2, hypertension, history of aortic stenosis status post aortic valve replacement as well as a history of Streptococcus bacteremia with presumed endocarditis last year of the prosthetic valve who presented to the ED for episodes of hematuria, chest pressure, epistaxis as well as bleeding from his gums.  He was recently discharged from the hospital on 07/03/2021 after he was treated for severe epistaxis related to severe thrombocytopenia.  At that time he transfused 5 packs of platelets during that hospitalization received packed RBCs.  Subsequently started developing chest heaviness denied any radiation to his arms or neck.  He denied any nausea or vomiting no diaphoresis.  His stools are black in color but denies any hematemesis or hematochezia.  He was admitted for the evaluation of his hematuria and heaviness in his chest and oncology and cardiology were also consulted.  He has a history of severe myelodysplastic syndrome and recently discharged for epistaxis.  During his hospitalization he was found to have a hemoglobin of 4.3 and a platelet count down to 6000 so he was transfused 3 units of PRBCs and 2 packs of platelets.  Medical oncology evaluated and feel he has had very little response to chemotherapy and they recommended giving the patient some Amicar mouth rinse and pending this hospitalization they may discuss hospice with him.  Cardiology was consulted for his chest heaviness and chest pain likely in the setting of demand ischemia from low hemoglobin.  Patient was given IV Lasix yesterday with improvement in his symptoms.  Subsequent work-up found that the  patient has a Streptococcus bacteremic and given his history of streptococcal bacteremia with presumed endocarditis ID was consulted and patient was placed on IV ceftriaxone.  Because his platelet count was 10,000 on 07/09/21 Hematology evaluated and recommending some Nplate as well as Aransep.  They are recommending decreasing the amount of Amicar mouth rinse.  Cardiology evaluating and his echo did show some hypokinesis of the septum which is more prominent than his prior echo.  He is not a candidate at this time and no longer having chest pain and they are recommending continue to treat medically.  Patient does have a Streptococcus bacteremia (GPC with Organism ID pending) and there seems to be relief of obstruction of the anterior cusp of the wall thickening.  Unfortunately patient cannot have a TEE secondary to severe thrombocytopenia and bleeding and cardiology may recommend continuing antibiotic treatments.  ID recommending 6 weeks of IV antibiotics and removal of his current Port-A-Cath but Heme/Onc and Patient don't want Port-A-Cath Removed.  We will continue IV ceftriaxone for now and can narrow to cefazolin based on sensitivities when resulted.  We will consult palliative care for further goals of care discussion  On 07/10/21 his Platelet Count went to 11 but he has no bleeding and Onc recommending holding off on transfusion and continuing to monitor his Blood Counts over the weekend.   Assessment & Plan:   Principal Problem:   Chest pain Active Problems:   Essential hypertension   S/P aortic valve replacement with bioprosthetic valve    S/P CABG x 2   Hyponatremia   Pancytopenia (HCC)   MDS (myelodysplastic syndrome), high grade ([place])  OSA on CPAP   Thrombocytopenia (HCC)   Acute blood loss anemia ([facility])   Bacteremia due to Gram-positive bacteria   MDS (myelodysplastic syndrome) (HCC)   Non-ST elevation (NSTEMI) myocardial infarction [facility])  Chest pain with Dyspena,  improved -Concerning for Type 2 non-ST elevation MI secondary to demand ischemia from severe anemia -Patient has a history of coronary artery disease and is status post CABG x 2 -Twelve-lead EKG reviewed by me shows an incomplete left bundle branch block with nonspecific ST-T wave changes -Will cycle cardiac enzymes -Patient is unable to receive aspirin or anticoagulation due to severe anemia requiring blood transfusion -Will obtain 2D echocardiogram to rule out regional wall motion abnormality; ECHO as below shows "There is severe hypokinesis of the septum that is out of proportion to postoperative septal movement. This  appears new compared with prior. The findings possibly suggest progression of disease proximal to LIMA-LAD  insertion. Left ventricular ejection fraction, by estimation, is 50 to 55%. The left ventricle has low normal function. The left ventricle demonstrates regional wall motion abnormalities (see scoring  diagram/findings for description). There is mild concentric left ventricular hypertrophy."  -We will transfuse 3 units of packed RBC for symptomatic anemia; Given Lasix with improvement  -Continue statins and reduced dose of beta-blocker to 12.5 mg twice daily -GI evaluated and they feel that given his significant hematuria and severe anemia as well as severe thrombocytopenia he would not be a good candidate for interventional measures and they evaluated and felt that his chest pain was mainly noticeable when he is laying down and did not occur with walk around.  They feel that his symptoms only happened after he received blood and after he was given IV Lasix yesterday he had not any further chest discomfort -Cardiology evaluated EKG and they saw significant ST depression in the setting of severe anemia -Cardiology feels patient had demand ischemia in the setting of pulmonary edema as well as severe anemia with hemoglobin 4.3 and recommending continue metoprolol tartrate but avoiding  heparin, aspirin or other antiplatelet given severe thrombocytopenia -Cardiology recommended goal-directed medical therapy with Crestor and metoprolol but because the patient had a recent AKI he is off his ACE inhibitor -Not a Candidate for For Invasive Studies; No longer having Chest Pain and EF is only minimally reduced    Myelodysplastic Syndrome with Pancytopenia -Severe anemia/thrombocytopenia with acute blood loss anemia secondary to hematuria, epistaxis and mucosal bleeding from his gums -Patient noted to have a hemoglobin of 4.3g/dl down from 7.4g/dl, 2 days ago -He also has platelet count of 6000 -Now WBC is 0.8, Hgb/Hct is 7.7/22.6, and Platelet Count is 11 -We will transfuse 3 units of packed RBC as well as 2 packs of platelets  -Will consult hematology/oncology and Dr. Marin Olp consulted  -Pending patient's clinical course patient may be hospice appropriate and medical oncology does not feel that he has been appropriately responding to the chemotherapy -Oncology Trying Nplate and Aransep  -Consult Palliative for further GOC Discussion  -PT/OT recommending no Follow up  Streptococcus Bacteremia -Blood Cx x2 showed GPC Streptococcus Species with Organism Identification pending  -Has a Hx of Streptococcus Bacteremia and Endocarditis (Has a Prosthetic Aortic Valve) -ECHO Done but he is not a good candidate for TEE Given Pancytopenia -He has a Port-A-Cath in place and this has been placed since January and there is concern for line infection and ID recommending considering line removal and depending on prognosis and what the plan is going forward if he chooses aggressive  measures and not hospice he will need to consider port removal -ID has started the patient IV ceftriaxone and Repeat Blood Cx done -ID recommending that if the patient wants aggressive care that he will need 6 weeks of IV Abx and removal of his Port-a-Cath and continuing IV Ceftriaxone for now but Patient and Onc do not  want Port-A-Cath removal. Will discuss with ID once Sensitivities have resulted.   Gross Hematuria, improved  -Most likely secondary to severe thrombocytopenia -Monitor closely as it is improving    HTN -Continue low-dose beta-blocker -Continue to monitor blood pressure closely -Continue to monitor blood pressures per protocol -Last blood pressure reading was 129/67  Hypokalemia -Patient's Potassium is now 4.0 -Continue monitor and replete as necessary -Mag level was 2.0 -Repeat CMP in the a.m.  DM2 -Maintain consistent carbohydrate diet -Continue with sliding scale insulin for glycemic control -CBG been ranging from 138-161 on last 3 CBGs; Glucose on CMP was 133  Hyponatremia -Patient's Na+ went from 127 -> 124 -> 128 -> 126 -Continue to Monitor and Trend -Repeat CMP in the AM   DVT prophylaxis: SCDs Code Status: FULL CODE  Family Communication: Discussed with Wife and Daughter at bedside  Disposition Plan: Pending further clinical Improvement   Status is: Inpatient  Remains inpatient appropriate because:IV treatments appropriate due to intensity of illness or inability to take PO and Inpatient level of care appropriate due to severity of illness  Dispo: The patient is from: Home              Anticipated d/c is to: Home              Patient currently is not medically stable to d/c.   Difficult to place patient No  Consultants:  Cardiology Medical Oncology ID Palliative Care Medicine   Procedures:  ECHOCARDIOGRAM  IMPRESSIONS     1. There is severe hypokinesis of the septum that is out of proportion to  postoperative septal movement. This appears new compared with prior. The  findings possibly suggest progression of disease proximal to LIMA-LAD  insertion. Left ventricular ejection   fraction, by estimation, is 50 to 55%. The left ventricle has low normal  function. The left ventricle demonstrates regional wall motion  abnormalities (see scoring  diagram/findings for description). There is  mild concentric left ventricular hypertrophy.  Indeterminate diastolic filling due to E-A fusion.   2. Right ventricular systolic function is normal. The right ventricular  size is normal. There is normal pulmonary artery systolic pressure. The  estimated right ventricular systolic pressure is 123XX123 mmHg.   3. The mitral valve is grossly normal. Mild mitral valve regurgitation.  No evidence of mitral stenosis.   4. 25 mm Magna Ease Pericardial bioprosthesis in the aortic position.  Vmax 2.2 m/s, MG 12 mmHG, EOA 1.92 cm2, DI 0.56. The leaflets appear  thickened but there is no increase in gradients or parameters to suggest  stenosis. The aortic valve has been  repaired/replaced. Aortic valve regurgitation is not visualized. Procedure  Date: 10/26/2017.   5. The inferior vena cava is normal in size with greater than 50%  respiratory variability, suggesting right atrial pressure of 3 mmHg.   Comparison(s): Changes from prior study are noted.   FINDINGS   Left Ventricle: There is severe hypokinesis of the septum that is out of  proportion to postoperative septal movement. This appears new compared  with prior. The findings possibly suggest progression of disease proximal  to LIMA-LAD insertion. Left  ventricular  ejection fraction, by estimation, is 50 to 55%. The left  ventricle has low normal function. The left ventricle demonstrates  regional wall motion abnormalities. The left ventricular internal cavity  size was normal in size. There is mild  concentric left ventricular hypertrophy. Abnormal (paradoxical) septal  motion consistent with post-operative status. Indeterminate diastolic  filling due to E-A fusion.      LV Wall Scoring:  The mid and distal anterior septum and inferior septum are hypokinetic.   Right Ventricle: The right ventricular size is normal. No increase in  right ventricular wall thickness. Right ventricular systolic  function is  normal. There is normal pulmonary artery systolic pressure. The tricuspid  regurgitant velocity is 2.86 m/s, and   with an assumed right atrial pressure of 3 mmHg, the estimated right  ventricular systolic pressure is 123XX123 mmHg.   Left Atrium: Left atrial size was normal in size.   Right Atrium: Right atrial size was normal in size. Prominent Chiari  network.   Pericardium: There is no evidence of pericardial effusion.   Mitral Valve: The mitral valve is grossly normal. Mild mitral valve  regurgitation. No evidence of mitral valve stenosis.   Tricuspid Valve: The tricuspid valve is grossly normal. Tricuspid valve  regurgitation is trivial. No evidence of tricuspid stenosis.   Aortic Valve: 25 mm Magna Ease Pericardial bioprosthesis in the aortic  position. Vmax 2.2 m/s, MG 12 mmHG, EOA 1.92 cm2, DI 0.56. The leaflets  appear thickened but there is no increase in gradients or parameters to  suggest stenosis. The aortic valve has  been repaired/replaced. Aortic valve regurgitation is not visualized.  Aortic valve mean gradient measures 13.0 mmHg. Aortic valve peak gradient  measures 19.4 mmHg. Aortic valve area, by VTI measures 1.92 cm. There is  a 25 mm [employer] Ease bovine  valve present in the aortic position.   Pulmonic Valve: The pulmonic valve was grossly normal. Pulmonic valve  regurgitation is trivial. No evidence of pulmonic stenosis.   Aorta: The aortic root and ascending aorta are structurally normal, with  no evidence of dilitation.   Venous: The inferior vena cava is normal in size with greater than 50%  respiratory variability, suggesting right atrial pressure of 3 mmHg.   IAS/Shunts: The atrial septum is grossly normal.      LEFT VENTRICLE  PLAX 2D  LVIDd:         4.90 cm      Diastology  LVIDs:         3.50 cm      LV e' medial:    6.53 cm/s  LV PW:         1.30 cm      LV E/e' medial:  16.4  LV IVS:        1.20 cm      LV e' lateral:    10.40 cm/s  LVOT diam:     2.10 cm      LV E/e' lateral: 10.3  LV SV:         76  LV SV Index:   37  LVOT Area:     3.46 cm     LV Volumes (MOD)  LV vol d, MOD A4C: 120.0 ml  LV vol s, MOD A4C: 50.2 ml  LV SV MOD A4C:     120.0 ml   RIGHT VENTRICLE  RV Basal diam:  3.20 cm  RV S prime:     9.03 cm/s  TAPSE (M-mode): 2.0 cm  LEFT ATRIUM             Index       RIGHT ATRIUM           Index  LA diam:        4.60 cm 2.23 cm/m  RA Area:     15.30 cm  LA Vol (A2C):   78.4 ml 38.04 ml/m RA Volume:   36.30 ml  17.62 ml/m  LA Vol (A4C):   53.1 ml 25.77 ml/m  LA Biplane Vol: 65.3 ml 31.69 ml/m   AORTIC VALVE  AV Area (Vmax):    1.79 cm  AV Area (Vmean):   1.54 cm  AV Area (VTI):     1.92 cm  AV Vmax:           220.33 cm/s  AV Vmean:          166.000 cm/s  AV VTI:            0.396 m  AV Peak Grad:      19.4 mmHg  AV Mean Grad:      13.0 mmHg  LVOT Vmax:         114.00 cm/s  LVOT Vmean:        73.700 cm/s  LVOT VTI:          0.220 m  LVOT/AV VTI ratio: 0.56     AORTA  Ao Root diam: 2.90 cm   MITRAL VALVE                TRICUSPID VALVE  MV Area (PHT): 3.93 cm     TR Peak grad:   32.7 mmHg  MV Decel Time: 193 msec     TR Vmax:        286.00 cm/s  MV E velocity: 107.00 cm/s  MV A velocity: 32.50 cm/s   SHUNTS  MV E/A ratio:  3.29         Systemic VTI:  0.22 m                              Systemic Diam: 2.10 cm   Antimicrobials:  Anti-infectives (From admission, onward)    Start     Dose/Rate Route Frequency Ordered Stop   07/08/21 1315  cefTRIAXone (ROCEPHIN) 2 g in sodium chloride 0.9 % 100 mL IVPB        2 g 200 mL/hr over 30 Minutes Intravenous Every 24 hours 07/08/21 1221     07/08/21 1000  famciclovir (FAMVIR) tablet 500 mg        500 mg Oral Daily 07/07/21 1521     07/08/21 1000  fluconazole (DIFLUCAN) tablet 100 mg        100 mg Oral Daily 07/07/21 1521     07/07/21 1330  vancomycin (VANCOREADY) IVPB 2000 mg/400 mL        2,000 mg 200 mL/hr over 120  Minutes Intravenous  Once 07/07/21 1256 07/07/21 1737   07/07/21 1300  ceFEPIme (MAXIPIME) 2 g in sodium chloride 0.9 % 100 mL IVPB        2 g 200 mL/hr over 30 Minutes Intravenous  Once 07/07/21 1255 07/07/21 1537        Subjective: Seen and examined at bedside and he was doing okay and denied any complaints or chest pain.  No shortness of breath.  Bleeding has resolved.  Feels better.  PT OT recommending no follow-up.  No other  concerns or complaints at this time.   Objective: Vitals:   07/09/21 1317 07/09/21 2100 07/10/21 0650 07/10/21 1243  BP: 121/62 123/71 117/66 129/67  Pulse: 89 91 92 90  Resp: 18   17  Temp: 98.4 F (36.9 C) 98.2 F (36.8 C) 98.4 F (36.9 C) 97.6 F (36.4 C)  TempSrc: Oral Oral Oral Oral  SpO2: 100% 98% 94% 100%  Weight:      Height:        Intake/Output Summary (Last 24 hours) at 07/10/2021 1458 Last data filed at 07/10/2021 1417 Gross per 24 hour  Intake 1080 ml  Output 2225 ml  Net -1145 ml    Filed Weights   07/07/21 1918  Weight: 90.2 kg   Examination: Physical Exam:  Constitutional: WN/WD overweight chronically ill appearing Caucasian male in NAD and appears calm and comfortable Eyes: Lids and conjunctivae normal, sclerae anicteric  ENMT: External Ears, Nose appear normal. Grossly normal hearing. Neck: Appears normal, supple, no cervical masses, normal ROM, no appreciable thyromegaly; no JVD Respiratory: Diminished to auscultation bilaterally, no wheezing, rales, rhonchi or crackles. Normal respiratory effort and patient is not tachypenic. No accessory muscle use. Unlabored breathing  Cardiovascular: RRR, no murmurs / rubs / gallops. S1 and S2 auscultated. Slight Lower extremity edema Abdomen: Soft, non-tender, Distended 2/2 to body habitus. Bowel sounds positive.  GU: Deferred. Musculoskeletal: No clubbing / cyanosis of digits/nails. No joint deformity upper and lower extremities.  Skin: No rashes, lesions, ulcers but has diffuse  bruising and ecchymosis in the Upper Extremities. No induration; Warm and dry.  Neurologic: CN 2-12 grossly intact with no focal deficits. Romberg sign and cerebellar reflexes not assessed.  Psychiatric: Normal judgment and insight. Alert and oriented x 3. Normal mood and appropriate affect.   Data Reviewed: I have personally reviewed following labs and imaging studies  CBC: Recent Labs  Lab 07/05/21 1030 07/07/21 1106 07/08/21 0839 07/08/21 1138 07/09/21 0506 07/10/21 0316  WBC 1.9* 1.4* 1.1* 0.9* 0.7* 0.8*  NEUTROABS 1.6* 1.1*  --  0.7* 0.3* 0.4*  HGB 7.4* 4.3* 8.5* 8.2* 8.0* 7.7*  HCT 21.4* 13.0* 24.7* 23.9* 23.5* 22.6*  MCV 85.6 88.4 84.9 86.6 85.1 86.9  PLT 6* 6* 8* 12* 10* 11*    Basic Metabolic Panel: Recent Labs  Lab 07/07/21 1106 07/08/21 0839 07/08/21 1138 07/09/21 0506 07/10/21 0316  NA 125* 127* 124* 128* 126*  K 3.7 3.5 3.4* 3.8 4.0  CL 96* 96* 93* 99 97*  CO2 21* 21* '22 23 23  '$ GLUCOSE 140* 146* 195* 130* 133*  BUN '17 18 19 15 18  '$ CREATININE 1.03 0.90 0.96 0.88 0.82  CALCIUM 8.2* 8.4* 8.4* 8.3* 8.3*  MG  --   --  1.9 1.9 2.0  PHOS  --   --  4.0 3.2 3.8    GFR: Estimated Creatinine Clearance: 90.4 mL/min (by C-G formula based on SCr of 0.82 mg/dL). Liver Function Tests: Recent Labs  Lab 07/05/21 1030 07/07/21 1106 07/08/21 1138 07/09/21 0506 07/10/21 0316  AST 36 36 38 44* 62*  ALT 42 39 39 44 63*  ALKPHOS 69 55 54 58 61  BILITOT 1.0 1.1 1.0 0.8 0.9  PROT 7.1 6.8 7.0 6.9 6.9  ALBUMIN 3.7 3.1* 3.4* 3.1* 3.2*    No results for input(s): LIPASE, AMYLASE in the last 168 hours. No results for input(s): AMMONIA in the last 168 hours. Coagulation Profile: Recent Labs  Lab 07/08/21 1138  INR 1.2    Cardiac Enzymes: No  results for input(s): CKTOTAL, CKMB, CKMBINDEX, TROPONINI in the last 168 hours. BNP (last 3 results) Recent Labs    12/09/20 1629  PROBNP 415*    HbA1C: No results for input(s): HGBA1C in the last 72  hours. CBG: Recent Labs  Lab 07/09/21 1640 07/09/21 2233 07/10/21 0608 07/10/21 0743 07/10/21 1141  GLUCAP 118* 167* 138* 147* 161*    Lipid Profile: No results for input(s): CHOL, HDL, LDLCALC, TRIG, CHOLHDL, LDLDIRECT in the last 72 hours. Thyroid Function Tests: No results for input(s): TSH, T4TOTAL, FREET4, T3FREE, THYROIDAB in the last 72 hours. Anemia Panel: No results for input(s): VITAMINB12, FOLATE, FERRITIN, TIBC, IRON, RETICCTPCT in the last 72 hours. Sepsis Labs: No results for input(s): PROCALCITON, LATICACIDVEN in the last 168 hours.  Recent Results (from the past 240 hour(s))  Blood culture (routine x 2)     Status: None (Preliminary result)   Collection Time: 07/07/21 12:40 PM   Specimen: BLOOD  Result Value Ref Range Status   Specimen Description   Final    BLOOD RIGHT ANTECUBITAL Performed at [facility] [address] [place], [place] [address]    Special Requests   Final    BOTTLES DRAWN AEROBIC AND ANAEROBIC Blood Culture adequate volume Performed at [facility] [address] [place], [place] [address]    Culture  Setup Time   Final    GRAM POSITIVE COCCI IN CHAINS IN BOTH AEROBIC AND ANAEROBIC BOTTLES CRITICAL VALUE NOTED.  VALUE IS CONSISTENT WITH PREVIOUSLY REPORTED AND CALLED VALUE.    Culture   Final    GRAM POSITIVE COCCI IDENTIFICATION TO FOLLOW Performed at [facility], [place] [address] [place], [place] [address]    Report Status PENDING  Incomplete  Blood culture (routine x 2)     Status: None (Preliminary result)   Collection Time: 07/07/21  1:20 PM   Specimen: BLOOD  Result Value Ref Range Status   Specimen Description   Final    BLOOD LEFT ANTECUBITAL Performed at [facility] [address], [place], [place] [address]    Special Requests   Final    BOTTLES DRAWN AEROBIC AND ANAEROBIC Blood Culture results may not be optimal due to an excessive volume of blood  received in culture bottles Performed at [facility] [address] [place], [place] [address]    Culture  Setup Time   Final    GRAM POSITIVE COCCI IN CHAINS IN BOTH AEROBIC AND ANAEROBIC BOTTLES CRITICAL RESULT CALLED TO, READ BACK BY AND VERIFIED WITH: PHARMD MARY TUCKER 07/08/21'@10'$ :56 BY TW    Culture   Final    GRAM POSITIVE COCCI IDENTIFICATION TO FOLLOW Performed at [facility], [place] [address], [place], [place] [address]    Report Status PENDING  Incomplete  Blood Culture ID Panel (Reflexed)     Status: Abnormal   Collection Time: 07/07/21  1:20 PM  Result Value Ref Range Status   Enterococcus faecalis NOT DETECTED NOT DETECTED Final   Enterococcus Faecium NOT DETECTED NOT DETECTED Final   Listeria monocytogenes NOT DETECTED NOT DETECTED Final   Staphylococcus species NOT DETECTED NOT DETECTED Final   Staphylococcus aureus (BCID) NOT DETECTED NOT DETECTED Final   Staphylococcus epidermidis NOT DETECTED NOT DETECTED Final   Staphylococcus lugdunensis NOT DETECTED NOT DETECTED Final   Streptococcus species DETECTED (A) NOT DETECTED Final    Comment: Not Enterococcus species, Streptococcus agalactiae, Streptococcus pyogenes, or Streptococcus pneumoniae. CRITICAL RESULT  CALLED TO, READ BACK BY AND VERIFIED WITH: PHARMD MARY TUCKER 07/08/21'@10'$ :55 BY TW    Streptococcus agalactiae NOT DETECTED NOT DETECTED Final   Streptococcus pneumoniae NOT DETECTED NOT DETECTED Final   Streptococcus pyogenes NOT DETECTED NOT DETECTED Final   A.calcoaceticus-baumannii NOT DETECTED NOT DETECTED Final   Bacteroides fragilis NOT DETECTED NOT DETECTED Final   Enterobacterales NOT DETECTED NOT DETECTED Final   Enterobacter cloacae complex NOT DETECTED NOT DETECTED Final   Escherichia coli NOT DETECTED NOT DETECTED Final   Klebsiella aerogenes NOT DETECTED NOT DETECTED Final   Klebsiella oxytoca NOT DETECTED NOT DETECTED Final   Klebsiella pneumoniae NOT DETECTED NOT  DETECTED Final   Proteus species NOT DETECTED NOT DETECTED Final   Salmonella species NOT DETECTED NOT DETECTED Final   Serratia marcescens NOT DETECTED NOT DETECTED Final   Haemophilus influenzae NOT DETECTED NOT DETECTED Final   Neisseria meningitidis NOT DETECTED NOT DETECTED Final   Pseudomonas aeruginosa NOT DETECTED NOT DETECTED Final   Stenotrophomonas maltophilia NOT DETECTED NOT DETECTED Final   Candida albicans NOT DETECTED NOT DETECTED Final   Candida auris NOT DETECTED NOT DETECTED Final   Candida glabrata NOT DETECTED NOT DETECTED Final   Candida krusei NOT DETECTED NOT DETECTED Final   Candida parapsilosis NOT DETECTED NOT DETECTED Final   Candida tropicalis NOT DETECTED NOT DETECTED Final   Cryptococcus neoformans/gattii NOT DETECTED NOT DETECTED Final    Comment: Performed at [facility], [address] [place], [place] [address]     RN Pressure Injury Documentation:     Estimated body mass index is 29.36 kg/m as calculated from the following:   Height as of this encounter: '5\' 9"'$  (1.753 m).   Weight as of this encounter: 90.2 kg.  Malnutrition Type:  Nutrition Problem: Increased nutrient needs Etiology: chronic illness, cancer and cancer related treatments   Malnutrition Characteristics:  Signs/Symptoms: estimated needs   Nutrition Interventions:  Interventions: Premier Protein, MVI   Radiology Studies: No results found.  Scheduled Meds:  aminocaproic acid  10 mL Oral Q6H   aminocaproic acid  1,000 mg Oral Q8H   Chlorhexidine Gluconate Cloth  6 each Topical Daily   famciclovir  500 mg Oral Daily   fluconazole  100 mg Oral Daily   fluticasone  2 spray Each Nare Daily   gabapentin  600 mg Oral QHS   insulin aspart  0-15 Units Subcutaneous TID WC   loratadine  10 mg Oral q morning   metoprolol tartrate  12.5 mg Oral BID   multivitamin with minerals  1 tablet Oral q morning   pantoprazole  40 mg Oral Daily   polyethylene glycol  17 g Oral  Daily   Ensure Max Protein  11 oz Oral BID   sodium chloride flush  10-40 mL Intracatheter Q12H   Continuous Infusions:  cefTRIAXone (ROCEPHIN)  IV 2 g (07/10/21 1237)    LOS: 3 days   Kerney Elbe, DO [facility] is on AMION  If 7PM-7AM, please contact night-coverage [email or web address]

## 2021-07-10 NOTE — Progress Notes (Addendum)
Progress Note  Patient Name: Theodore Jenkins Date of Encounter: 07/10/2021  [facility] Cardiologist: Lauree Chandler, MD   Subjective   Denies any CP or SOB. Notes that we are doing a lot of things to him and not sure if his body will be able to handle everything.  Inpatient Medications    Scheduled Meds:  aminocaproic acid  10 mL Oral Q6H   aminocaproic acid  1,000 mg Oral Q8H   Chlorhexidine Gluconate Cloth  6 each Topical Daily   famciclovir  500 mg Oral Daily   fluconazole  100 mg Oral Daily   fluticasone  2 spray Each Nare Daily   gabapentin  600 mg Oral QHS   insulin aspart  0-15 Units Subcutaneous TID WC   loratadine  10 mg Oral q morning   metoprolol tartrate  12.5 mg Oral BID   multivitamin with minerals  1 tablet Oral q morning   pantoprazole  40 mg Oral Daily   polyethylene glycol  17 g Oral Daily   Ensure Max Protein  11 oz Oral BID   sodium chloride flush  10-40 mL Intracatheter Q12H   Continuous Infusions:  cefTRIAXone (ROCEPHIN)  IV 2 g (07/09/21 1311)   PRN Meds: acetaminophen, LORazepam, nitroGLYCERIN, ondansetron **OR** ondansetron (ZOFRAN) IV, sodium chloride flush   Vital Signs    Vitals:   07/09/21 0519 07/09/21 1317 07/09/21 2100 07/10/21 0650  BP: 114/73 121/62 123/71 117/66  Pulse: 89 89 91 92  Resp: 17 18    Temp: 98.6 F (37 C) 98.4 F (36.9 C) 98.2 F (36.8 C) 98.4 F (36.9 C)  TempSrc: Oral Oral Oral Oral  SpO2: 98% 100% 98% 94%  Weight:      Height:        Intake/Output Summary (Last 24 hours) at 07/10/2021 0819 Last data filed at 07/10/2021 0513 Gross per 24 hour  Intake 1420 ml  Output 2225 ml  Net -805 ml   Last 3 Weights 07/07/2021 06/28/2021 06/08/2021  Weight (lbs) 198 lb 12.8 oz 206 lb 2.1 oz 213 lb  Weight (kg) 90.175 kg 93.5 kg 96.616 kg      Telemetry    NSR  - Personally Reviewed  ECG    07/09/21 SR 1st HB iRBBB - Personally Reviewed  Physical Exam   GEN: No acute distress.   Neck: No JVD Cardiac:  RRR, Systoloic crescendo and subtle diastolic crescendo noted Respiratory: Clear to auscultation bilaterally. GI: Soft, nontender, non-distended  MS: No edema; No deformity. Neuro:  Nonfocal  Psych: Normal affect   Labs    High Sensitivity Troponin:   Recent Labs  Lab 07/07/21 1106 07/07/21 1321 07/07/21 1711 07/07/21 1909  TROPONINIHS 446* 483* 486* 499*      Chemistry Recent Labs  Lab 07/08/21 1138 07/09/21 0506 07/10/21 0316  NA 124* 128* 126*  K 3.4* 3.8 4.0  CL 93* 99 97*  CO2 '22 23 23  '$ GLUCOSE 195* 130* 133*  BUN '19 15 18  '$ CREATININE 0.96 0.88 0.82  CALCIUM 8.4* 8.3* 8.3*  PROT 7.0 6.9 6.9  ALBUMIN 3.4* 3.1* 3.2*  AST 38 44* 62*  ALT 39 44 63*  ALKPHOS 54 58 61  BILITOT 1.0 0.8 0.9  GFRNONAA >60 >60 >60  ANIONGAP '9 6 6     '$ Hematology Recent Labs  Lab 07/08/21 1138 07/09/21 0506 07/10/21 0316  WBC 0.9* 0.7* 0.8*  RBC 2.76* 2.76* 2.60*  HGB 8.2* 8.0* 7.7*  HCT 23.9* 23.5* 22.6*  MCV 86.6 85.1 86.9  MCH 29.7 29.0 29.6  MCHC 34.3 34.0 34.1  RDW 16.1* 16.0* 16.4*  PLT 12* 10* 11*    BNP Recent Labs  Lab 07/07/21 1106  BNP 825.3*     DDimer No results for input(s): DDIMER in the last 168 hours.   Radiology    ECHOCARDIOGRAM COMPLETE  Result Date: 07/08/2021    ECHOCARDIOGRAM REPORT   Patient Name:   [facility]od[facility] Date of Exam: 07/08/2021 Medical Rec #:  [id number]   Height:       69.0 in Accession #:    [id number]  Weight:       198.8 lb Date of Birth:  07-07-1946  BSA:          2.061 m Patient Age:    75 years    BP:           137/78 mmHg Patient Gender: M           HR:           88 bpm. Exam Location:  Inpatient Procedure: 2D Echo, Cardiac Doppler and Color Doppler Indications:    Chest pain  History:        Patient has prior history of Echocardiogram examinations, most                 recent 12/11/2020. CAD, Prior CABG, Stroke and Carotid Disease,                 Signs/Symptoms:Chest Pain and Hematuria; Risk                  Factors:Hypertension, Diabetes, Dyslipidemia and Obesity.  Sonographer:    Dustin Flock Referring Phys: [id number] [place] AGBATA IMPRESSIONS  1. There is severe hypokinesis of the septum that is out of proportion to postoperative septal movement. This appears new compared with prior. The findings possibly suggest progression of disease proximal to LIMA-LAD insertion. Left ventricular ejection  fraction, by estimation, is 50 to 55%. The left ventricle has low normal function. The left ventricle demonstrates regional wall motion abnormalities (see scoring diagram/findings for description). There is mild concentric left ventricular hypertrophy. Indeterminate diastolic filling due to E-A fusion.  2. Right ventricular systolic function is normal. The right ventricular size is normal. There is normal pulmonary artery systolic pressure. The estimated right ventricular systolic pressure is 123XX123 mmHg.  3. The mitral valve is grossly normal. Mild mitral valve regurgitation. No evidence of mitral stenosis.  4. 25 mm Magna Ease Pericardial bioprosthesis in the aortic position. Vmax 2.2 m/s, MG 12 mmHG, EOA 1.92 cm2, DI 0.56. The leaflets appear thickened but there is no increase in gradients or parameters to suggest stenosis. The aortic valve has been repaired/replaced. Aortic valve regurgitation is not visualized. Procedure Date: 10/26/2017.  5. The inferior vena cava is normal in size with greater than 50% respiratory variability, suggesting right atrial pressure of 3 mmHg. Comparison(s): Changes from prior study are noted. FINDINGS  Left Ventricle: There is severe hypokinesis of the septum that is out of proportion to postoperative septal movement. This appears new compared with prior. The findings possibly suggest progression of disease proximal to LIMA-LAD insertion. Left ventricular ejection fraction, by estimation, is 50 to 55%. The left ventricle has low normal function. The left ventricle demonstrates regional wall  motion abnormalities. The left ventricular internal cavity size was normal in size. There is mild concentric left ventricular hypertrophy. Abnormal (paradoxical) septal motion consistent with post-operative status. Indeterminate diastolic filling due  to E-A fusion.  LV Wall Scoring: The mid and distal anterior septum and inferior septum are hypokinetic. Right Ventricle: The right ventricular size is normal. No increase in right ventricular wall thickness. Right ventricular systolic function is normal. There is normal pulmonary artery systolic pressure. The tricuspid regurgitant velocity is 2.86 m/s, and  with an assumed right atrial pressure of 3 mmHg, the estimated right ventricular systolic pressure is 123XX123 mmHg. Left Atrium: Left atrial size was normal in size. Right Atrium: Right atrial size was normal in size. Prominent Chiari network. Pericardium: There is no evidence of pericardial effusion. Mitral Valve: The mitral valve is grossly normal. Mild mitral valve regurgitation. No evidence of mitral valve stenosis. Tricuspid Valve: The tricuspid valve is grossly normal. Tricuspid valve regurgitation is trivial. No evidence of tricuspid stenosis. Aortic Valve: 25 mm Magna Ease Pericardial bioprosthesis in the aortic position. Vmax 2.2 m/s, MG 12 mmHG, EOA 1.92 cm2, DI 0.56. The leaflets appear thickened but there is no increase in gradients or parameters to suggest stenosis. The aortic valve has been repaired/replaced. Aortic valve regurgitation is not visualized. Aortic valve mean gradient measures 13.0 mmHg. Aortic valve peak gradient measures 19.4 mmHg. Aortic valve area, by VTI measures 1.92 cm. There is a 25 mm [employer] Ease bovine valve present in the aortic position. Pulmonic Valve: The pulmonic valve was grossly normal. Pulmonic valve regurgitation is trivial. No evidence of pulmonic stenosis. Aorta: The aortic root and ascending aorta are structurally normal, with no evidence of dilitation. Venous:  The inferior vena cava is normal in size with greater than 50% respiratory variability, suggesting right atrial pressure of 3 mmHg. IAS/Shunts: The atrial septum is grossly normal.  LEFT VENTRICLE PLAX 2D LVIDd:         4.90 cm      Diastology LVIDs:         3.50 cm      LV e' medial:    6.53 cm/s LV PW:         1.30 cm      LV E/e' medial:  16.4 LV IVS:        1.20 cm      LV e' lateral:   10.40 cm/s LVOT diam:     2.10 cm      LV E/e' lateral: 10.3 LV SV:         76 LV SV Index:   37 LVOT Area:     3.46 cm  LV Volumes (MOD) LV vol d, MOD A4C: 120.0 ml LV vol s, MOD A4C: 50.2 ml LV SV MOD A4C:     120.0 ml RIGHT VENTRICLE RV Basal diam:  3.20 cm RV S prime:     9.03 cm/s TAPSE (M-mode): 2.0 cm LEFT ATRIUM             Index       RIGHT ATRIUM           Index LA diam:        4.60 cm 2.23 cm/m  RA Area:     15.30 cm LA Vol (A2C):   78.4 ml 38.04 ml/m RA Volume:   36.30 ml  17.62 ml/m LA Vol (A4C):   53.1 ml 25.77 ml/m LA Biplane Vol: 65.3 ml 31.69 ml/m  AORTIC VALVE AV Area (Vmax):    1.79 cm AV Area (Vmean):   1.54 cm AV Area (VTI):     1.92 cm AV Vmax:  220.33 cm/s AV Vmean:          166.000 cm/s AV VTI:            0.396 m AV Peak Grad:      19.4 mmHg AV Mean Grad:      13.0 mmHg LVOT Vmax:         114.00 cm/s LVOT Vmean:        73.700 cm/s LVOT VTI:          0.220 m LVOT/AV VTI ratio: 0.56  AORTA Ao Root diam: 2.90 cm MITRAL VALVE                TRICUSPID VALVE MV Area (PHT): 3.93 cm     TR Peak grad:   32.7 mmHg MV Decel Time: 193 msec     TR Vmax:        286.00 cm/s MV E velocity: 107.00 cm/s MV A velocity: 32.50 cm/s   SHUNTS MV E/A ratio:  3.29         Systemic VTI:  0.22 m                             Systemic Diam: 2.10 cm Eleonore Chiquito MD Electronically signed by Eleonore Chiquito MD Signature Date/Time: 07/08/2021/2:25:30 PM    Final     Cardiac Studies   Echo 07/08/2021 . There is severe hypokinesis of the septum that is out of proportion to  postoperative septal movement. This appears  new compared with prior. The  findings possibly suggest progression of disease proximal to LIMA-LAD  insertion. Left ventricular ejection   fraction, by estimation, is 50 to 55%. The left ventricle has low normal  function. The left ventricle demonstrates regional wall motion  abnormalities (see scoring diagram/findings for description). There is  mild concentric left ventricular hypertrophy.  Indeterminate diastolic filling due to E-A fusion.   2. Right ventricular systolic function is normal. The right ventricular  size is normal. There is normal pulmonary artery systolic pressure. The  estimated right ventricular systolic pressure is 123XX123 mmHg.   3. The mitral valve is grossly normal. Mild mitral valve regurgitation.  No evidence of mitral stenosis.   4. 25 mm Magna Ease Pericardial bioprosthesis in the aortic position.  Vmax 2.2 m/s, MG 12 mmHG, EOA 1.92 cm2, DI 0.56. The leaflets appear  thickened but there is no increase in gradients or parameters to suggest  stenosis. The aortic valve has been  repaired/replaced. Aortic valve regurgitation is not visualized. Procedure  Date: 10/26/2017.   5. The inferior vena cava is normal in size with greater than 50%  respiratory variability, suggesting right atrial pressure of 3 mmHg.   Comparison(s): Changes from prior study are noted.   Patient Profile     75 y.o. male with PMH of CAD s/p 2v CABG (LIMA-LAD, SVG-OM) 2018, bioprosthetic AVR at the time of bypass, history of CVA, carotid artery disease s/p R CEA and eventual occlusion of the right carotid artery, AAA, HTN, HLD, DM II and significant myelodysplastic disorder presented with chest pain. Symptom is more noticeable laying down when compare to physical exertion. Improved after diuresis. Echo showed septal hypokinesis. Blood culture   Assessment & Plan    Demand NSTEMI in the setting of anemia and volume overload, hx of CAD s/p CABG 2018 and CAS and HLD, prior stroke  - ST changes  in the anterior leads improved with blood and platelet transfusion  -  with new LAD territory WMA; limited options in the setting of MDS             - continue crestor             - continue BB for above and HTN with DM  Bacteremia: streptococcus species  - given heart murmur and s/p SAVR TEE would normally be appropriate  - not a candidate for TEE again given thrombocytopenia.  .             - continue abx and observe  Severe thrombocytopenia with hematuria and anemia 2/2 MDS       -Received 3 units of packed red blood cell and 1 unit of platelet transfusion; no plans for transfusion for 07/10/21   Overall planned for conservative mgmt in the setting of significant MDS.  Will follow peripherally 07/11/21 unless new changes occur (07/11/21 Addendum): no events overnight. VT listen on telemetry is actually short runs of SVT.  Occasional PVCs.  No significant change to therapy.  For questions or updates, please contact [place] Please consult [email or web address] for contact info under        Signed, Werner Lean, MD  07/10/2021, 8:19 AM

## 2021-07-10 NOTE — Progress Notes (Signed)
Overall, Theodore Jenkins had a relatively decent day.  He ate little bit better.  He did not have any bleeding.  His hemoglobin is 8.  His platelet count is 11,000.  We will not transfuse today.  The white cell count is still quite low at 0.8.  His BUN is 18 and creatinine 0.82.  His LFTs are minimally elevated.  There is still no identification for the gram-positive cocci in his blood.  I do not believe that the Port-A-Cath needs to come out.  This is incredibly important for him.  He has not had any temperatures.  I will think that IV antibiotic should be adequate to be able to get rid of the infection.  I know that cardiology is following.  I totally agree that there is no surgical intervention here.  I think the "big picture" is that he has this severe myelodysplasia.  This is what will determine his prognosis.  Hopefully, the bacteremia can be treated with oral antibiotics.  I did give him some Nplate and Procrit.  Hopefully, this will help stabilize his blood counts little bit.  His vital signs are all pretty stable.  His temperature is 98.4.  Pulse 92.  Blood pressure 117/66.  His lungs sound clear.  His oral exam shows the hematoma on the lower lip to be improved.  There is no intraoral hematoma or petechia.  Cardiac exam is regular rate and rhythm.  Abdomen is soft.  Again, we will see how his blood counts trend over the weekend.  Since he is not bleeding, I would hold off on transfusing him.  Hopefully the ID for the Streptococcus will be available.  Again, I would NOT remove the Port-A-Cath.  This is incredibly important for him and his quality of life.  I do appreciate the outstanding care he is getting from all staff on [facility].  I appreciate all of their hard work.   Lattie Haw, MD  Philippians 4:13

## 2021-07-10 NOTE — Progress Notes (Signed)
OT Cancellation Note  Patient Details Name: Theodore Jenkins MRN: [id number] DOB: 28-Jun-1946   Cancelled Treatment:    Reason Eval/Treat Not Completed: Fatigue/lethargy limiting ability to participate. Patient declined therapy - reports just getting back to bed after being up most of the day. Will f/u as able.  Kawhi Diebold L Jamai Dolce 07/10/2021, 3:02 PM

## 2021-07-11 LAB — GLUCOSE, CAPILLARY
Glucose-Capillary: 182 mg/dL — ABNORMAL HIGH (ref 70–99)
Glucose-Capillary: 237 mg/dL — ABNORMAL HIGH (ref 70–99)
Glucose-Capillary: 128 mg/dL — ABNORMAL HIGH (ref 70–99)
Glucose-Capillary: 153 mg/dL — ABNORMAL HIGH (ref 70–99)

## 2021-07-11 LAB — CBC WITH DIFFERENTIAL/PLATELET
Abs Immature Granulocytes: 0.23 10*3/uL — ABNORMAL HIGH (ref 0.00–0.07)
Basophils Absolute: 0 10*3/uL (ref 0.0–0.1)
Basophils Relative: 0 %
Eosinophils Absolute: 0 10*3/uL (ref 0.0–0.5)
Eosinophils Relative: 0 %
HCT: 22.7 % — ABNORMAL LOW (ref 39.0–52.0)
Hemoglobin: 7.6 g/dL — ABNORMAL LOW (ref 13.0–17.0)
Immature Granulocytes: 27 %
Lymphocytes Relative: 32 %
Lymphs Abs: 0.3 10*3/uL — ABNORMAL LOW (ref 0.7–4.0)
MCH: 29.8 pg (ref 26.0–34.0)
MCHC: 33.5 g/dL (ref 30.0–36.0)
MCV: 89 fL (ref 80.0–100.0)
Monocytes Absolute: 0.1 10*3/uL (ref 0.1–1.0)
Monocytes Relative: 11 %
Neutro Abs: 0.3 10*3/uL — CL (ref 1.7–7.7)
Neutrophils Relative %: 30 %
Platelets: 13 10*3/uL — CL (ref 150–400)
RBC: 2.55 MIL/uL — ABNORMAL LOW (ref 4.22–5.81)
RDW: 16.5 % — ABNORMAL HIGH (ref 11.5–15.5)
WBC: 0.8 10*3/uL — CL (ref 4.0–10.5)
nRBC: 8.3 % — ABNORMAL HIGH (ref 0.0–0.2)

## 2021-07-11 LAB — MAGNESIUM: Magnesium: 2.1 mg/dL (ref 1.7–2.4)

## 2021-07-11 LAB — COMPREHENSIVE METABOLIC PANEL
ALT: 60 U/L — ABNORMAL HIGH (ref 0–44)
AST: 48 U/L — ABNORMAL HIGH (ref 15–41)
Albumin: 3.2 g/dL — ABNORMAL LOW (ref 3.5–5.0)
Alkaline Phosphatase: 60 U/L (ref 38–126)
Anion gap: 7 (ref 5–15)
BUN: 16 mg/dL (ref 8–23)
CO2: 23 mmol/L (ref 22–32)
Calcium: 8.3 mg/dL — ABNORMAL LOW (ref 8.9–10.3)
Chloride: 98 mmol/L (ref 98–111)
Creatinine, Ser: 1 mg/dL (ref 0.61–1.24)
GFR, Estimated: >60 mL/min (ref >60)
Glucose, Bld: 143 mg/dL — ABNORMAL HIGH (ref 70–99)
Potassium: 3.7 mmol/L (ref 3.5–5.1)
Sodium: 128 mmol/L — ABNORMAL LOW (ref 135–145)
Total Bilirubin: 0.7 mg/dL (ref 0.3–1.2)
Total Protein: 7 g/dL (ref 6.5–8.1)

## 2021-07-11 LAB — PHOSPHORUS: Phosphorus: 4 mg/dL (ref 2.5–4.6)

## 2021-07-11 NOTE — Progress Notes (Signed)
OT Cancellation Note  Patient Details Name: RAMEER BRUCKMAN MRN: [id number] DOB: [birth year]   Cancelled Treatment:    Reason Eval/Treat Not Completed: OT screened, no needs identified, will sign off. Patient reports no OT needs. Able to perform functional mobility and ADLs. Reports ambulating in hall with spouse.   Ellie Spickler L Dianara Smullen 07/11/2021, 2:03 PM

## 2021-07-11 NOTE — Progress Notes (Signed)
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  He was recently discharged from the hospital on 07/03/2021 after he was treated for severe epistaxis related to severe thrombocytopenia.  At that time he transfused 5 packs of platelets during that hospitalization received packed RBCs.  Subsequently started developing chest heaviness denied any radiation to his arms or neck.  He denied any nausea or vomiting no diaphoresis.  His stools are black in color but denies any hematemesis or hematochezia.  He was admitted for the evaluation of his hematuria and heaviness in his chest and oncology and cardiology were also consulted.  He has a history of severe myelodysplastic syndrome and recently discharged for epistaxis.  During his hospitalization he was found to have a hemoglobin of 4.3 and a platelet count down to 6000 so he was transfused 3 units of PRBCs and 2 packs of platelets.  Medical oncology evaluated and feel he has had very little response to chemotherapy and they recommended giving the patient some Amicar mouth rinse and pending this hospitalization they may discuss hospice with him.  Cardiology was consulted for his chest heaviness and chest pain likely in the setting of demand ischemia from low hemoglobin.  Patient was given IV Lasix yesterday with improvement in his symptoms.  Subsequent work-up found that the  patient has a Streptococcus bacteremic and given his history of streptococcal bacteremia with presumed endocarditis ID was consulted and patient was placed on IV ceftriaxone.  Because his platelet count was 10,000 on 07/09/21 Hematology evaluated and recommending some Nplate as well as Aransep.  They are recommending decreasing the amount of Amicar mouth rinse.  Cardiology evaluating and his echo did show some hypokinesis of the septum which is more prominent than his prior echo.  He is not a candidate at this time and no longer having chest pain and they are recommending continue to treat medically.  Patient does have a Streptococcus bacteremia (GPC with Organism ID pending) and there seems to be relief of obstruction of the anterior cusp of the wall thickening.  Unfortunately patient cannot have a TEE secondary to severe thrombocytopenia and bleeding and cardiology may recommend continuing antibiotic treatments.  ID recommending 6 weeks of IV antibiotics and removal of his current Port-A-Cath but Heme/Onc and Patient don't want Port-A-Cath Removed.  We will continue IV ceftriaxone for now and can narrow to cefazolin based on sensitivities when resulted.  We will consult palliative care for further goals of care discussion  On 07/10/21 his Platelet Count went to 11 but he has no bleeding and Onc recommending holding off on transfusion and continuing to monitor his Blood Counts over the weekend. Today his Platelet Count was 13 and Cxs are still pending.   Assessment & Plan:   Principal Problem:   Chest pain Active Problems:   Essential hypertension   S/P aortic valve replacement with bioprosthetic valve    S/P CABG x 2   Hyponatremia  Pancytopenia (HCC)   MDS (myelodysplastic syndrome), high grade (HCC)   OSA on CPAP   Thrombocytopenia (HCC)   Acute blood loss anemia (ABLA)   Bacteremia due to Gram-positive bacteria   MDS (myelodysplastic syndrome) (HCC)   Non-ST elevation (NSTEMI) myocardial  infarction [facility])  Chest pain with Dyspena, improved -Concerning for Type 2 non-ST elevation MI secondary to demand ischemia from severe anemia -Patient has a history of coronary artery disease and is status post CABG x 2 -Twelve-lead EKG reviewed by me shows an incomplete left bundle branch block with nonspecific ST-T wave changes -Will cycle cardiac enzymes -Patient is unable to receive aspirin or anticoagulation due to severe anemia requiring blood transfusion -Will obtain 2D echocardiogram to rule out regional wall motion abnormality; ECHO as below shows "There is severe hypokinesis of the septum that is out of proportion to postoperative septal movement. This  appears new compared with prior. The findings possibly suggest progression of disease proximal to LIMA-LAD  insertion. Left ventricular ejection fraction, by estimation, is 50 to 55%. The left ventricle has low normal function. The left ventricle demonstrates regional wall motion abnormalities (see scoring  diagram/findings for description). There is mild concentric left ventricular hypertrophy."  -We will transfuse 3 units of packed RBC for symptomatic anemia; Given Lasix with improvement  -Continue statins and reduced dose of beta-blocker to 12.5 mg twice daily -GI evaluated and they feel that given his significant hematuria and severe anemia as well as severe thrombocytopenia he would not be a good candidate for interventional measures and they evaluated and felt that his chest pain was mainly noticeable when he is laying down and did not occur with walk around.  They feel that his symptoms only happened after he received blood and after he was given IV Lasix yesterday he had not any further chest discomfort -Cardiology evaluated EKG and they saw significant ST depression in the setting of severe anemia -Cardiology feels patient had demand ischemia in the setting of pulmonary edema as well as severe anemia with hemoglobin 4.3 and  recommending continue metoprolol tartrate but avoiding heparin, aspirin or other antiplatelet given severe thrombocytopenia -Cardiology recommended goal-directed medical therapy with Crestor and metoprolol but because the patient had a recent AKI he is off his ACE inhibitor -Not a Candidate for For Invasive Studies; No longer having Chest Pain and EF is only minimally reduced    Myelodysplastic Syndrome with Pancytopenia -Severe anemia/thrombocytopenia with acute blood loss anemia secondary to hematuria, epistaxis and mucosal bleeding from his gums -Patient noted to have a hemoglobin of 4.3g/dl down from 7.4g/dl, 2 days ago -He also has platelet count of 6000 -Now WBC is 0.8, Hgb/Hct is 7.6/22.7, and Platelet Count is 13 -We will transfuse 3 units of packed RBC as well as 2 packs of platelets  -Will consult hematology/oncology and Dr. Marin Olp consulted  -Pending patient's clinical course patient may be hospice appropriate and medical oncology does not feel that he has been appropriately responding to the chemotherapy -Oncology Trying Nplate and Aransep  -Consult Palliative for further GOC Discussion  -PT/OT recommending no Follow up  Streptococcus Bacteremia -Blood Cx x2 showed GPC Streptococcus Species with Organism Identification pending  -Has a Hx of Streptococcus Bacteremia and Endocarditis (Has a Prosthetic Aortic Valve) -ECHO Done but he is not a good candidate for TEE Given Pancytopenia -He has a Port-A-Cath in place and this has been placed since January and there is concern for line infection and ID recommending considering line removal and depending on  prognosis and what the plan is going forward if he chooses aggressive measures and not hospice he will need to consider port removal -ID has started the patient IV ceftriaxone and Repeat Blood Cx showing NGTD at 1 Day  -ID recommending that if the patient wants aggressive care that he will need 6 weeks of IV Abx and removal of his  Port-a-Cath and continuing IV Ceftriaxone for now but Patient and Onc do not want Port-A-Cath removal. Will discuss with ID once Sensitivities have resulted.   Gross Hematuria, improved  -Most likely secondary to severe thrombocytopenia -Monitor closely as it is improving    HTN -Continue low-dose beta-blocker -Continue to monitor blood pressure closely -Continue to monitor blood pressures per protocol -Last blood pressure reading was 119/72  Hypokalemia -Patient's Potassium is now 3.7 -Continue monitor and replete as necessary -Mag level was 2.0 -Repeat CMP in the a.m.  DM2 -Maintain consistent carbohydrate diet -Continue with sliding scale insulin for glycemic control -CBG been ranging from 138-234 on last 3 CBGs; Glucose on CMP was 143  Hyponatremia -Patient's Na+ went from 127 -> 124 -> 128 -> 126 -> 128 -Continue to Monitor and Trend -Repeat CMP in the AM   DVT prophylaxis: SCDs Code Status: FULL CODE  Family Communication: Discussed with Wife and Daughter at bedside  Disposition Plan: Pending further clinical Improvement   Status is: Inpatient  Remains inpatient appropriate because:IV treatments appropriate due to intensity of illness or inability to take PO and Inpatient level of care appropriate due to severity of illness  Dispo: The patient is from: Home              Anticipated d/c is to: Home              Patient currently is not medically stable to d/c.   Difficult to place patient No  Consultants:  Cardiology Medical Oncology ID Palliative Care Medicine   Procedures:  ECHOCARDIOGRAM  IMPRESSIONS     1. There is severe hypokinesis of the septum that is out of proportion to  postoperative septal movement. This appears new compared with prior. The  findings possibly suggest progression of disease proximal to LIMA-LAD  insertion. Left ventricular ejection   fraction, by estimation, is 50 to 55%. The left ventricle has low normal  function. The left  ventricle demonstrates regional wall motion  abnormalities (see scoring diagram/findings for description). There is  mild concentric left ventricular hypertrophy.  Indeterminate diastolic filling due to E-A fusion.   2. Right ventricular systolic function is normal. The right ventricular  size is normal. There is normal pulmonary artery systolic pressure. The  estimated right ventricular systolic pressure is 123XX123 mmHg.   3. The mitral valve is grossly normal. Mild mitral valve regurgitation.  No evidence of mitral stenosis.   4. 25 mm Magna Ease Pericardial bioprosthesis in the aortic position.  Vmax 2.2 m/s, MG 12 mmHG, EOA 1.92 cm2, DI 0.56. The leaflets appear  thickened but there is no increase in gradients or parameters to suggest  stenosis. The aortic valve has been  repaired/replaced. Aortic valve regurgitation is not visualized. Procedure  Date: 10/26/2017.   5. The inferior vena cava is normal in size with greater than 50%  respiratory variability, suggesting right atrial pressure of 3 mmHg.   Comparison(s): Changes from prior study are noted.   FINDINGS   Left Ventricle: There is severe hypokinesis of the septum that is out of  proportion to postoperative septal movement. This appears new  compared  with prior. The findings possibly suggest progression of disease proximal  to LIMA-LAD insertion. Left  ventricular ejection fraction, by estimation, is 50 to 55%. The left  ventricle has low normal function. The left ventricle demonstrates  regional wall motion abnormalities. The left ventricular internal cavity  size was normal in size. There is mild  concentric left ventricular hypertrophy. Abnormal (paradoxical) septal  motion consistent with post-operative status. Indeterminate diastolic  filling due to E-A fusion.      LV Wall Scoring:  The mid and distal anterior septum and inferior septum are hypokinetic.   Right Ventricle: The right ventricular size is normal. No  increase in  right ventricular wall thickness. Right ventricular systolic function is  normal. There is normal pulmonary artery systolic pressure. The tricuspid  regurgitant velocity is 2.86 m/s, and   with an assumed right atrial pressure of 3 mmHg, the estimated right  ventricular systolic pressure is 123XX123 mmHg.   Left Atrium: Left atrial size was normal in size.   Right Atrium: Right atrial size was normal in size. Prominent Chiari  network.   Pericardium: There is no evidence of pericardial effusion.   Mitral Valve: The mitral valve is grossly normal. Mild mitral valve  regurgitation. No evidence of mitral valve stenosis.   Tricuspid Valve: The tricuspid valve is grossly normal. Tricuspid valve  regurgitation is trivial. No evidence of tricuspid stenosis.   Aortic Valve: 25 mm Magna Ease Pericardial bioprosthesis in the aortic  position. Vmax 2.2 m/s, MG 12 mmHG, EOA 1.92 cm2, DI 0.56. The leaflets  appear thickened but there is no increase in gradients or parameters to  suggest stenosis. The aortic valve has  been repaired/replaced. Aortic valve regurgitation is not visualized.  Aortic valve mean gradient measures 13.0 mmHg. Aortic valve peak gradient  measures 19.4 mmHg. Aortic valve area, by VTI measures 1.92 cm. There is  a 25 mm [employer] Ease bovine  valve present in the aortic position.   Pulmonic Valve: The pulmonic valve was grossly normal. Pulmonic valve  regurgitation is trivial. No evidence of pulmonic stenosis.   Aorta: The aortic root and ascending aorta are structurally normal, with  no evidence of dilitation.   Venous: The inferior vena cava is normal in size with greater than 50%  respiratory variability, suggesting right atrial pressure of 3 mmHg.   IAS/Shunts: The atrial septum is grossly normal.      LEFT VENTRICLE  PLAX 2D  LVIDd:         4.90 cm      Diastology  LVIDs:         3.50 cm      LV e' medial:    6.53 cm/s  LV PW:         1.30 cm       LV E/e' medial:  16.4  LV IVS:        1.20 cm      LV e' lateral:   10.40 cm/s  LVOT diam:     2.10 cm      LV E/e' lateral: 10.3  LV SV:         76  LV SV Index:   37  LVOT Area:     3.46 cm     LV Volumes (MOD)  LV vol d, MOD A4C: 120.0 ml  LV vol s, MOD A4C: 50.2 ml  LV SV MOD A4C:     120.0 ml   RIGHT VENTRICLE  RV Basal  diam:  3.20 cm  RV S prime:     9.03 cm/s  TAPSE (M-mode): 2.0 cm   LEFT ATRIUM             Index       RIGHT ATRIUM           Index  LA diam:        4.60 cm 2.23 cm/m  RA Area:     15.30 cm  LA Vol (A2C):   78.4 ml 38.04 ml/m RA Volume:   36.30 ml  17.62 ml/m  LA Vol (A4C):   53.1 ml 25.77 ml/m  LA Biplane Vol: 65.3 ml 31.69 ml/m   AORTIC VALVE  AV Area (Vmax):    1.79 cm  AV Area (Vmean):   1.54 cm  AV Area (VTI):     1.92 cm  AV Vmax:           220.33 cm/s  AV Vmean:          166.000 cm/s  AV VTI:            0.396 m  AV Peak Grad:      19.4 mmHg  AV Mean Grad:      13.0 mmHg  LVOT Vmax:         114.00 cm/s  LVOT Vmean:        73.700 cm/s  LVOT VTI:          0.220 m  LVOT/AV VTI ratio: 0.56     AORTA  Ao Root diam: 2.90 cm   MITRAL VALVE                TRICUSPID VALVE  MV Area (PHT): 3.93 cm     TR Peak grad:   32.7 mmHg  MV Decel Time: 193 msec     TR Vmax:        286.00 cm/s  MV E velocity: 107.00 cm/s  MV A velocity: 32.50 cm/s   SHUNTS  MV E/A ratio:  3.29         Systemic VTI:  0.22 m                              Systemic Diam: 2.10 cm   Antimicrobials:  Anti-infectives (From admission, onward)    Start     Dose/Rate Route Frequency Ordered Stop   07/08/21 1315  cefTRIAXone (ROCEPHIN) 2 g in sodium chloride 0.9 % 100 mL IVPB        2 g 200 mL/hr over 30 Minutes Intravenous Every 24 hours 07/08/21 1221     07/08/21 1000  famciclovir (FAMVIR) tablet 500 mg        500 mg Oral Daily 07/07/21 1521     07/08/21 1000  fluconazole (DIFLUCAN) tablet 100 mg        100 mg Oral Daily 07/07/21 1521     07/07/21 1330  vancomycin  (VANCOREADY) IVPB 2000 mg/400 mL        2,000 mg 200 mL/hr over 120 Minutes Intravenous  Once 07/07/21 1256 07/07/21 1737   07/07/21 1300  ceFEPIme (MAXIPIME) 2 g in sodium chloride 0.9 % 100 mL IVPB        2 g 200 mL/hr over 30 Minutes Intravenous  Once 07/07/21 1255 07/07/21 1537        Subjective: Seen and examined at bedside and he was doing ok. Wanting to go home. Denied any CP  or SOB. No further bleeding. No lightheadedness or dizziness. No other concerns or complaints at this time.   Objective: Vitals:   07/10/21 0650 07/10/21 1243 07/10/21 1959 07/11/21 0413  BP: 117/66 129/67 (!) 120/57 119/72  Pulse: 92 90 97 87  Resp:  '17 19 18  '$ Temp: 98.4 F (36.9 C) 97.6 F (36.4 C) 98.4 F (36.9 C) 98.4 F (36.9 C)  TempSrc: Oral Oral Oral Oral  SpO2: 94% 100% 100% 95%  Weight:      Height:        Intake/Output Summary (Last 24 hours) at 07/11/2021 [id number] Last data filed at 07/11/2021 0116 Gross per 24 hour  Intake 840 ml  Output 1900 ml  Net -1060 ml    Filed Weights   07/07/21 1918  Weight: 90.2 kg   Examination: Physical Exam:  Constitutional: WN/WD overweight chronically ill appearing Caucasian male in NAD appears calm and comfortable sitting in the chair  Eyes: Lids and conjunctivae normal, sclerae anicteric  ENMT: External Ears, Nose appear normal. Grossly normal hearing  Neck: Appears normal, supple, no cervical masses, normal ROM, no appreciable thyromegaly; no JVD Respiratory: Diminished to auscultation bilaterally, no wheezing, rales, rhonchi or crackles. Normal respiratory effort and patient is not tachypenic. No accessory muscle use. Unlabored breathing  Cardiovascular: RRR, no murmurs / rubs / gallops. S1 and S2 auscultated. No appreciable LE edema Abdomen: Soft, non-tender,  Distended 2/2 to body habitus. Bowel sounds positive.  GU: Deferred. Musculoskeletal: No clubbing / cyanosis of digits/nails. No joint deformity upper and lower extremities.  Skin: No  rashes, lesions, ulcers but has diffused bruising and ecchymosis in the Upper Extremities. No induration; Warm and dry.  Neurologic: CN 2-12 grossly intact with no focal deficits. Romberg sign and cerebellar reflexes not assessed.  Psychiatric: Normal judgment and insight. Alert and oriented x 3. Normal mood and appropriate affect.   Data Reviewed: I have personally reviewed following labs and imaging studies  CBC: Recent Labs  Lab 07/07/21 1106 07/08/21 0839 07/08/21 1138 07/09/21 0506 07/10/21 0316 07/11/21 0355  WBC 1.4* 1.1* 0.9* 0.7* 0.8* 0.8*  NEUTROABS 1.1*  --  0.7* 0.3* 0.4* 0.3*  HGB 4.3* 8.5* 8.2* 8.0* 7.7* 7.6*  HCT 13.0* 24.7* 23.9* 23.5* 22.6* 22.7*  MCV 88.4 84.9 86.6 85.1 86.9 89.0  PLT 6* 8* 12* 10* 11* 13*    Basic Metabolic Panel: Recent Labs  Lab 07/08/21 0839 07/08/21 1138 07/09/21 0506 07/10/21 0316 07/11/21 0355  NA 127* 124* 128* 126* 128*  K 3.5 3.4* 3.8 4.0 3.7  CL 96* 93* 99 97* 98  CO2 21* '22 23 23 23  '$ GLUCOSE 146* 195* 130* 133* 143*  BUN '18 19 15 18 16  '$ CREATININE 0.90 0.96 0.88 0.82 1.00  CALCIUM 8.4* 8.4* 8.3* 8.3* 8.3*  MG  --  1.9 1.9 2.0 2.1  PHOS  --  4.0 3.2 3.8 4.0    GFR: Estimated Creatinine Clearance: 74.1 mL/min (by C-G formula based on SCr of 1 mg/dL). Liver Function Tests: Recent Labs  Lab 07/07/21 1106 07/08/21 1138 07/09/21 0506 07/10/21 0316 07/11/21 0355  AST 36 38 44* 62* 48*  ALT 39 39 44 63* 60*  ALKPHOS 55 54 58 61 60  BILITOT 1.1 1.0 0.8 0.9 0.7  PROT 6.8 7.0 6.9 6.9 7.0  ALBUMIN 3.1* 3.4* 3.1* 3.2* 3.2*    No results for input(s): LIPASE, AMYLASE in the last 168 hours. No results for input(s): AMMONIA in the last 168 hours. Coagulation Profile: Recent  Labs  Lab 07/08/21 1138  INR 1.2    Cardiac Enzymes: No results for input(s): CKTOTAL, CKMB, CKMBINDEX, TROPONINI in the last 168 hours. BNP (last 3 results) Recent Labs    12/09/20 1629  PROBNP 415*    HbA1C: No results for input(s):  HGBA1C in the last 72 hours. CBG: Recent Labs  Lab 07/10/21 0743 07/10/21 1141 07/10/21 1622 07/10/21 2003 07/11/21 0858  GLUCAP 147* 161* 152* 234* 182*    Lipid Profile: No results for input(s): CHOL, HDL, LDLCALC, TRIG, CHOLHDL, LDLDIRECT in the last 72 hours. Thyroid Function Tests: No results for input(s): TSH, T4TOTAL, FREET4, T3FREE, THYROIDAB in the last 72 hours. Anemia Panel: No results for input(s): VITAMINB12, FOLATE, FERRITIN, TIBC, IRON, RETICCTPCT in the last 72 hours. Sepsis Labs: No results for input(s): PROCALCITON, LATICACIDVEN in the last 168 hours.  Recent Results (from the past 240 hour(s))  Blood culture (routine x 2)     Status: None (Preliminary result)   Collection Time: 07/07/21 12:40 PM   Specimen: BLOOD  Result Value Ref Range Status   Specimen Description   Final    BLOOD RIGHT ANTECUBITAL Performed at [facility] [address] [place], [place] [address]    Special Requests   Final    BOTTLES DRAWN AEROBIC AND ANAEROBIC Blood Culture adequate volume Performed at [facility] [address], [place], [place] [address]    Culture  Setup Time   Final    GRAM POSITIVE COCCI IN CHAINS IN BOTH AEROBIC AND ANAEROBIC BOTTLES CRITICAL VALUE NOTED.  VALUE IS CONSISTENT WITH PREVIOUSLY REPORTED AND CALLED VALUE.    Culture   Final    GRAM POSITIVE COCCI IDENTIFICATION TO FOLLOW Performed at [facility], [place] [address] [place], [place] [address]    Report Status PENDING  Incomplete  Blood culture (routine x 2)     Status: None (Preliminary result)   Collection Time: 07/07/21  1:20 PM   Specimen: BLOOD  Result Value Ref Range Status   Specimen Description   Final    BLOOD LEFT ANTECUBITAL Performed at [facility] [address], [place], [place] [address]    Special Requests   Final    BOTTLES DRAWN AEROBIC AND ANAEROBIC Blood Culture results may not be optimal due to an  excessive volume of blood received in culture bottles Performed at [facility] [address] [place], [place] [address]    Culture  Setup Time   Final    GRAM POSITIVE COCCI IN CHAINS IN BOTH AEROBIC AND ANAEROBIC BOTTLES CRITICAL RESULT CALLED TO, READ BACK BY AND VERIFIED WITH: PHARMD MARY TUCKER 07/08/21'@10'$ :39 BY TW    Culture   Final    GRAM POSITIVE COCCI IDENTIFICATION TO FOLLOW Performed at [facility], [place] [address] [place],  [address]    Report Status PENDING  Incomplete  Blood Culture ID Panel (Reflexed)     Status: Abnormal   Collection Time: 07/07/21  1:20 PM  Result Value Ref Range Status   Enterococcus faecalis NOT DETECTED NOT DETECTED Final   Enterococcus Faecium NOT DETECTED NOT DETECTED Final   Listeria monocytogenes NOT DETECTED NOT DETECTED Final   Staphylococcus species NOT DETECTED NOT DETECTED Final   Staphylococcus aureus (BCID) NOT DETECTED NOT DETECTED Final   Staphylococcus epidermidis NOT DETECTED NOT DETECTED Final   Staphylococcus lugdunensis NOT DETECTED NOT DETECTED Final   Streptococcus species DETECTED (A) NOT DETECTED Final  Comment: Not Enterococcus species, Streptococcus agalactiae, Streptococcus pyogenes, or Streptococcus pneumoniae. CRITICAL RESULT CALLED TO, READ BACK BY AND VERIFIED WITH: PHARMD MARY TUCKER 07/08/21'@10'$ :55 BY TW    Streptococcus agalactiae NOT DETECTED NOT DETECTED Final   Streptococcus pneumoniae NOT DETECTED NOT DETECTED Final   Streptococcus pyogenes NOT DETECTED NOT DETECTED Final   A.calcoaceticus-baumannii NOT DETECTED NOT DETECTED Final   Bacteroides fragilis NOT DETECTED NOT DETECTED Final   Enterobacterales NOT DETECTED NOT DETECTED Final   Enterobacter cloacae complex NOT DETECTED NOT DETECTED Final   Escherichia coli NOT DETECTED NOT DETECTED Final   Klebsiella aerogenes NOT DETECTED NOT DETECTED Final   Klebsiella oxytoca NOT DETECTED NOT DETECTED Final   Klebsiella  pneumoniae NOT DETECTED NOT DETECTED Final   Proteus species NOT DETECTED NOT DETECTED Final   Salmonella species NOT DETECTED NOT DETECTED Final   Serratia marcescens NOT DETECTED NOT DETECTED Final   Haemophilus influenzae NOT DETECTED NOT DETECTED Final   Neisseria meningitidis NOT DETECTED NOT DETECTED Final   Pseudomonas aeruginosa NOT DETECTED NOT DETECTED Final   Stenotrophomonas maltophilia NOT DETECTED NOT DETECTED Final   Candida albicans NOT DETECTED NOT DETECTED Final   Candida auris NOT DETECTED NOT DETECTED Final   Candida glabrata NOT DETECTED NOT DETECTED Final   Candida krusei NOT DETECTED NOT DETECTED Final   Candida parapsilosis NOT DETECTED NOT DETECTED Final   Candida tropicalis NOT DETECTED NOT DETECTED Final   Cryptococcus neoformans/gattii NOT DETECTED NOT DETECTED Final    Comment: Performed at [facility], [address] [place], [place] [address]  Culture, blood (routine x 2)     Status: None (Preliminary result)   Collection Time: 07/09/21  5:24 AM   Specimen: BLOOD  Result Value Ref Range Status   Specimen Description   Final    BLOOD RIGHT ARM Performed at [facility] [address], [place], [place] [address]    Special Requests   Final    BOTTLES DRAWN AEROBIC ONLY Blood Culture results may not be optimal due to an excessive volume of blood received in culture bottles Performed at [facility] [address] [place], [place] [address]    Culture   Final    NO GROWTH 1 DAY Performed at [facility], [place] [address], [place], [place] [address]    Report Status PENDING  Incomplete  Culture, blood (routine x 2)     Status: None (Preliminary result)   Collection Time: 07/09/21  5:24 AM   Specimen: BLOOD RIGHT HAND  Result Value Ref Range Status   Specimen Description   Final    BLOOD RIGHT HAND Performed at [facility] [address] [place], [place] [address]    Special  Requests   Final    BOTTLES DRAWN AEROBIC ONLY Blood Culture adequate volume Performed at [facility] [address], [place], [place] [address]    Culture   Final    NO GROWTH 1 DAY Performed at [facility], [place] [address] [place], [place] [address]    Report Status PENDING  Incomplete     RN Pressure Injury Documentation:     Estimated body mass index is 29.36 kg/m as calculated from the following:   Height as of this encounter: '5\' 9"'$  (1.753 m).   Weight as of this encounter: 90.2 kg.  Malnutrition Type:  Nutrition Problem: Increased nutrient needs Etiology: chronic illness, cancer and cancer related treatments  Malnutrition Characteristics:  Signs/Symptoms: estimated needs   Nutrition Interventions:  Interventions: Premier Protein, MVI   Radiology Studies: No results found.  Scheduled Meds:  aminocaproic acid  10 mL Oral Q6H   aminocaproic acid  1,000 mg Oral Q8H   Chlorhexidine Gluconate Cloth  6 each Topical Daily   famciclovir  500 mg Oral Daily   fluconazole  100 mg Oral Daily   fluticasone  2 spray Each Nare Daily   gabapentin  600 mg Oral QHS   insulin aspart  0-15 Units Subcutaneous TID WC   loratadine  10 mg Oral q morning   metoprolol tartrate  12.5 mg Oral BID   multivitamin with minerals  1 tablet Oral q morning   pantoprazole  40 mg Oral Daily   polyethylene glycol  17 g Oral Daily   Ensure Max Protein  11 oz Oral BID   sodium chloride flush  10-40 mL Intracatheter Q12H   Continuous Infusions:  cefTRIAXone (ROCEPHIN)  IV 2 g (07/10/21 1237)    LOS: 4 days   Kerney Elbe, DO [facility] PAGER is on AMION  If 7PM-7AM, please contact night-coverage [email or web address]

## 2021-07-12 DIAGNOSIS — R7881 Bacteremia: Secondary | ICD-10-CM | POA: Diagnosis not present

## 2021-07-12 DIAGNOSIS — D696 Thrombocytopenia, unspecified: Secondary | ICD-10-CM | POA: Diagnosis not present

## 2021-07-12 DIAGNOSIS — D649 Anemia, unspecified: Secondary | ICD-10-CM | POA: Diagnosis not present

## 2021-07-12 DIAGNOSIS — D469 Myelodysplastic syndrome, unspecified: Secondary | ICD-10-CM | POA: Diagnosis not present

## 2021-07-12 DIAGNOSIS — R079 Chest pain, unspecified: Secondary | ICD-10-CM | POA: Diagnosis not present

## 2021-07-12 LAB — COMPREHENSIVE METABOLIC PANEL
ALT: 53 U/L — ABNORMAL HIGH (ref 0–44)
AST: 39 U/L (ref 15–41)
Albumin: 3.2 g/dL — ABNORMAL LOW (ref 3.5–5.0)
Alkaline Phosphatase: 55 U/L (ref 38–126)
Anion gap: 10 (ref 5–15)
BUN: 18 mg/dL (ref 8–23)
CO2: 23 mmol/L (ref 22–32)
Calcium: 8.9 mg/dL (ref 8.9–10.3)
Chloride: 99 mmol/L (ref 98–111)
Creatinine, Ser: 0.86 mg/dL (ref 0.61–1.24)
GFR, Estimated: >60 mL/min (ref >60)
Glucose, Bld: 147 mg/dL — ABNORMAL HIGH (ref 70–99)
Potassium: 3.9 mmol/L (ref 3.5–5.1)
Sodium: 132 mmol/L — ABNORMAL LOW (ref 135–145)
Total Bilirubin: 0.6 mg/dL (ref 0.3–1.2)
Total Protein: 7 g/dL (ref 6.5–8.1)

## 2021-07-12 LAB — CBC WITH DIFFERENTIAL/PLATELET
Abs Immature Granulocytes: 0.32 10*3/uL — ABNORMAL HIGH (ref 0.00–0.07)
Basophils Absolute: 0 10*3/uL (ref 0.0–0.1)
Basophils Relative: 0 %
Eosinophils Absolute: 0 10*3/uL (ref 0.0–0.5)
Eosinophils Relative: 0 %
HCT: 22.2 % — ABNORMAL LOW (ref 39.0–52.0)
Hemoglobin: 7.3 g/dL — ABNORMAL LOW (ref 13.0–17.0)
Immature Granulocytes: 29 %
Lymphocytes Relative: 34 %
Lymphs Abs: 0.4 10*3/uL — ABNORMAL LOW (ref 0.7–4.0)
MCH: 29.2 pg (ref 26.0–34.0)
MCHC: 32.9 g/dL (ref 30.0–36.0)
MCV: 88.8 fL (ref 80.0–100.0)
Monocytes Absolute: 0.1 10*3/uL (ref 0.1–1.0)
Monocytes Relative: 10 %
Neutro Abs: 0.3 10*3/uL — CL (ref 1.7–7.7)
Neutrophils Relative %: 27 %
Platelets: 13 10*3/uL — CL (ref 150–400)
RBC: 2.5 MIL/uL — ABNORMAL LOW (ref 4.22–5.81)
RDW: 16.6 % — ABNORMAL HIGH (ref 11.5–15.5)
WBC: 1.1 10*3/uL — CL (ref 4.0–10.5)
nRBC: 10.1 % — ABNORMAL HIGH (ref 0.0–0.2)

## 2021-07-12 LAB — GLUCOSE, CAPILLARY
Glucose-Capillary: 145 mg/dL — ABNORMAL HIGH (ref 70–99)
Glucose-Capillary: 266 mg/dL — ABNORMAL HIGH (ref 70–99)
Glucose-Capillary: 154 mg/dL — ABNORMAL HIGH (ref 70–99)
Glucose-Capillary: 193 mg/dL — ABNORMAL HIGH (ref 70–99)

## 2021-07-12 LAB — MAGNESIUM: Magnesium: 2 mg/dL (ref 1.7–2.4)

## 2021-07-12 LAB — PHOSPHORUS: Phosphorus: 4.2 mg/dL (ref 2.5–4.6)

## 2021-07-12 MED ORDER — EPOETIN ALFA 40000 UNIT/ML IJ SOLN: 40000.0000 [IU] | Freq: Once | INTRAMUSCULAR | Status: DC

## 2021-07-12 MED ORDER — EPOETIN ALFA 20000 UNIT/ML IJ SOLN
40000.0000 [IU] | Freq: Once | INTRAMUSCULAR | Status: AC
  Administered 2021-07-12: 40000 [IU] via SUBCUTANEOUS
  Filled 2021-07-12: qty 2
  Filled 2021-07-12: qty 1

## 2021-07-12 NOTE — Progress Notes (Signed)
[facility] for Infectious [facility]  Date of Admission:  07/07/2021           Reason for visit: Follow up on bacteremia  Current antibiotics: Ceftriaxone    ASSESSMENT:    Strep bacteremia: final organism ID and susceptibilities pending.  He had Strep infantarius PVE in May 2021 (colonoscopy at that time was clean since part of the [place] family).  Repeat blood cultures are NGTD.  Plan is to retain Port for now.  TTE without obvious vegetation but would need TEE given prosthetic valve to r/o IE.  Unfortunately, his counts prohibit this for now.  He was unable to tolerate gentamicin previously as ideally this would be added for PVE treatment.  PLAN:    Continue ceftriaxone for now Await ID and sensitivities as may be able to narrow to PCN Not an aminoglycoside candidate so will plan for beta lactam monotherapy for at least 6 weeks Will follow   Principal Problem:   Chest pain Active Problems:   Essential hypertension   S/P aortic valve replacement with bioprosthetic valve    S/P CABG x 2   Hyponatremia   Pancytopenia (HCC)   MDS (myelodysplastic syndrome), high grade (HCC)   OSA on CPAP   Thrombocytopenia (HCC)   Acute blood loss anemia (ABLA)   Bacteremia due to Gram-positive bacteria   MDS (myelodysplastic syndrome) (HCC)   Non-ST elevation (NSTEMI) myocardial infarction (HCC)    MEDICATIONS:    Scheduled Meds:  aminocaproic acid  10 mL Oral Q6H   aminocaproic acid  1,000 mg Oral Q8H   Chlorhexidine Gluconate Cloth  6 each Topical Daily   epoetin alfa  40,000 Units Subcutaneous Once   famciclovir  500 mg Oral Daily   fluconazole  100 mg Oral Daily   fluticasone  2 spray Each Nare Daily   gabapentin  600 mg Oral QHS   insulin aspart  0-15 Units Subcutaneous TID WC   loratadine  10 mg Oral q morning   metoprolol tartrate  12.5 mg Oral BID   multivitamin with minerals  1 tablet Oral q morning   pantoprazole  40 mg Oral Daily   polyethylene glycol  17 g  Oral Daily   Ensure Max Protein  11 oz Oral BID   sodium chloride flush  10-40 mL Intracatheter Q12H   Continuous Infusions:  cefTRIAXone (ROCEPHIN)  IV 2 g (07/11/21 1250)   PRN Meds:.acetaminophen, LORazepam, nitroGLYCERIN, ondansetron **OR** ondansetron (ZOFRAN) IV, sodium chloride flush  SUBJECTIVE:   24 hour events:  No acute events Afebrile WBC 1.1, ANC 0.3 Plt 13    No fevers, no bleeding, no issues with his port a cath.  No other new complaints.  Review of Systems  All other systems reviewed and are negative.    OBJECTIVE:   Blood pressure 121/65, pulse 90, temperature 98.2 F (36.8 C), temperature source Oral, resp. rate 20, height '5\' 9"'$  (1.753 m), weight 90.2 kg, SpO2 97 %. Body mass index is 29.36 kg/m.  Physical Exam Constitutional:      General: He is not in acute distress.    Appearance: Normal appearance.  HENT:     Head: Normocephalic and atraumatic.  Cardiovascular:     Comments: Right sided Port in place.  No warmth, erythema.  Pulmonary:     Effort: Pulmonary effort is normal. No respiratory distress.  Skin:    General: Skin is warm and dry.     Findings: No rash.  Neurological:  General: No focal deficit present.     Mental Status: He is alert and oriented to person, place, and time.     Lab Results: Lab Results  Component Value Date   WBC 1.1 (LL) 07/12/2021   HGB 7.3 (L) 07/12/2021   HCT 22.2 (L) 07/12/2021   MCV 88.8 07/12/2021   PLT 13 (LL) 07/12/2021    Lab Results  Component Value Date   NA 132 (L) 07/12/2021   K 3.9 07/12/2021   CO2 23 07/12/2021   GLUCOSE 147 (H) 07/12/2021   BUN 18 07/12/2021   CREATININE 0.86 07/12/2021   CALCIUM 8.9 07/12/2021   GFRNONAA >60 07/12/2021   GFRAA 56 (L) 12/09/2020    Lab Results  Component Value Date   ALT 53 (H) 07/12/2021   AST 39 07/12/2021   ALKPHOS 55 07/12/2021   BILITOT 0.6 07/12/2021    No results found for: CRP  No results found for: ESRSEDRATE   I have  reviewed the micro and lab results in Epic.  Imaging: No results found.   Imaging independently reviewed in Epic.    Raynelle [facility] for [facility] [phone] pager 07/12/2021, 11:27 AM  I spent greater than 35 minutes with the patient including greater than 50% of time in face to face counsel of the patient and in coordination of their care.

## 2021-07-12 NOTE — Progress Notes (Signed)
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  He was recently discharged from the hospital on 07/03/2021 after he was treated for severe epistaxis related to severe thrombocytopenia.  At that time he transfused 5 packs of platelets during that hospitalization received packed RBCs.  Subsequently started developing chest heaviness denied any radiation to his arms or neck.  He denied any nausea or vomiting no diaphoresis.  His stools are black in color but denies any hematemesis or hematochezia.  He was admitted for the evaluation of his hematuria and heaviness in his chest and oncology and cardiology were also consulted.  He has a history of severe myelodysplastic syndrome and recently discharged for epistaxis.  During his hospitalization he was found to have a hemoglobin of 4.3 and a platelet count down to 6000 so he was transfused 3 units of PRBCs and 2 packs of platelets.  Medical oncology evaluated and feel he has had very little response to chemotherapy and they recommended giving the patient some Amicar mouth rinse and pending this hospitalization they may discuss hospice with him.  Cardiology was consulted for his chest heaviness and chest pain likely in the setting of demand ischemia from low hemoglobin.  Patient was given IV Lasix yesterday with improvement in his symptoms.  Subsequent work-up found that the  patient has a Streptococcus bacteremic and given his history of streptococcal bacteremia with presumed endocarditis ID was consulted and patient was placed on IV ceftriaxone.  Because his platelet count was 10,000 on 07/09/21 Hematology evaluated and recommending some Nplate as well as Aransep.  They are recommending decreasing the amount of Amicar mouth rinse.  Cardiology evaluating and his echo did show some hypokinesis of the septum which is more prominent than his prior echo.  He is not a candidate at this time and no longer having chest pain and they are recommending continue to treat medically.  Patient does have a Streptococcus bacteremia (GPC with Organism ID pending) and there seems to be relief of obstruction of the anterior cusp of the wall thickening.  Unfortunately patient cannot have a TEE secondary to severe thrombocytopenia and bleeding and cardiology may recommend continuing antibiotic treatments.  ID recommending 6 weeks of IV antibiotics and removal of his current Port-A-Cath but Heme/Onc and Patient don't want Port-A-Cath Removed.  We will continue IV ceftriaxone for now and can narrow to cefazolin based on sensitivities when resulted.  We will consult palliative care for further goals of care discussion but this is still pending.   On 07/10/21 his Platelet Count went to 11 but he has no bleeding and Onc recommending holding off on transfusion and continuing to monitor his Blood Counts over the weekend. Yesterday and Today his Platelet Count was 13 and Cxs are still pending and I called the micro lab and sensitivities are still pending and should be hopefully resulted tomorrow and if they are not they may need to send to [facility] for final identification.  Assessment & Plan:   Principal Problem:   Chest pain Active Problems:   Essential hypertension   S/P aortic valve replacement with bioprosthetic valve    S/P CABG x 2   Hyponatremia   Pancytopenia (HCC)   MDS (myelodysplastic  syndrome), high grade (HCC)   OSA on CPAP   Thrombocytopenia (HCC)   Acute blood loss anemia (ABLA)   Bacteremia due to Gram-positive bacteria   MDS (myelodysplastic syndrome) (HCC)   Non-ST elevation (NSTEMI) myocardial infarction (HCC)  Chest pain with Dyspena, improved -Concerning for Type 2 non-ST elevation MI secondary to demand ischemia from severe anemia -Patient has a history of coronary artery disease and is status post CABG x 2 -Twelve-lead EKG reviewed by me shows an incomplete left bundle branch block with nonspecific ST-T wave changes -Will cycle cardiac enzymes -Patient is unable to receive aspirin or anticoagulation due to severe anemia requiring blood transfusion -Will obtain 2D echocardiogram to rule out regional wall motion abnormality; ECHO as below shows "There is severe hypokinesis of the septum that is out of proportion to postoperative septal movement. This  appears new compared with prior. The findings possibly suggest progression of disease proximal to LIMA-LAD  insertion. Left ventricular ejection fraction, by estimation, is 50 to 55%. The left ventricle has low normal function. The left ventricle demonstrates regional wall motion abnormalities (see scoring  diagram/findings for description). There is mild concentric left ventricular hypertrophy."  -We will transfuse 3 units of packed RBC for symptomatic anemia; Given Lasix with improvement  -Continue statins and reduced dose of beta-blocker to 12.5 mg twice daily -GI evaluated and they feel that given his significant hematuria and severe anemia as well as severe thrombocytopenia he would not be a good candidate for interventional measures and they evaluated and felt that his chest pain was mainly noticeable when he is laying down and did not occur with walk around.  They feel that his symptoms only happened after he received blood and after he was given IV Lasix yesterday he had not any further chest  discomfort -Cardiology evaluated EKG and they saw significant ST depression in the setting of severe anemia -Cardiology feels patient had demand ischemia in the setting of pulmonary edema as well as severe anemia with hemoglobin 4.3 and recommending continue metoprolol tartrate but avoiding heparin, aspirin or other antiplatelet given severe thrombocytopenia -Cardiology recommended goal-directed medical therapy with Crestor and metoprolol but because the patient had a recent AKI he is off his ACE inhibitor -Not a Candidate for For Invasive Studies; No longer having Chest Pain and EF is only minimally reduced    Myelodysplastic Syndrome with Pancytopenia -Severe anemia/thrombocytopenia with acute blood loss anemia secondary to hematuria, epistaxis and mucosal bleeding from his gums -Patient noted to have a hemoglobin of 4.3g/dl down from 7.4g/dl, 2 days ago -He also has platelet count of 6000 -Now WBC is 1.1, Hgb/Hct is 7.3/22.2, and Platelet Count is 13 -He is status posttransfusion of 3 units of packed RBC as well as 2 packs of platelets  -Will consult Hematology/Oncology and Dr. Marin Olp consulted  -Pending patient's clinical course patient may be hospice appropriate and medical oncology does not feel that he has been appropriately responding to the chemotherapy -Oncology Trying Nplate and Aransep and this was given on 07/09/2021: Now oncology recommending holding off on transfusions -Consult Palliative for further GOC Discussion and this is still pending to be happening -PT/OT recommending no Follow up  Streptococcus Bacteremia -Blood Cx x2 showed GPC Streptococcus Species with  Organism Identification pending  -Has a Hx of Streptococcus Bacteremia and Endocarditis (Has a Prosthetic Aortic Valve) -ECHO Done but he is not a good candidate for TEE Given Pancytopenia -He has a Port-A-Cath in place and this has been placed since January and there is concern for line infection and ID recommending  considering line removal and depending on prognosis and what the plan is going forward if he chooses aggressive measures and not hospice he will need to consider port removal -ID has started the patient IV ceftriaxone and Repeat Blood Cx showing NGTD at 1 Day  -ID recommending that if the patient wants aggressive care that he will need 6 weeks of IV Abx and removal of his Port-a-Cath and continuing IV Ceftriaxone for now but Patient and Onc do not want Port-A-Cath removal. Will discuss with ID once Sensitivities have resulted and they are still pending.  I spoke with micro and they said that hopefully should be resulted tomorrow and if not we will need to stop it out and send it down to [facility] for final identification -ID recommends continuing the same with IV ceftriaxone and await sensitivities so that they may be little narrow to penicillin; patient is not a aminoglycoside candidate given that he had gentamicin previously and so there is no plan for beta-lactam monotherapy for least 6 weeks and then probably start p.o. after   Gross Hematuria, improved  -Most likely secondary to severe thrombocytopenia -Monitor closely as it is improving    HTN -Continue low-dose beta-blocker -Continue to monitor blood pressure closely -Continue to monitor blood pressures per protocol -Last blood pressure reading was 134/64  Abnormal LFTs  -Was mild and slightly elevated improving now as patient AST has gone from 18 and is now 39 and ALT is now gone from 63 is now 53 -Continue to monitor and trend hepatic function panel and if necessary will obtain a right upper quadrant ultrasound as well as an acute hepatitis panel -Repeat CMP in the a.m.  Hypokalemia -Patient's Potassium is now 3.9 -Continue monitor and replete as necessary -Mag level was 2.0 -Repeat CMP in the a.m.  DM2 -Maintain consistent carbohydrate diet -Continue with sliding scale insulin for glycemic control -CBG been ranging from 145-266 on  last 3 CBGs; Glucose on CMP was 147  Hyponatremia -Patient's Na+ went from 127 -> 124 -> 128 -> 126 -> 128 and is now 132 -Continue to Monitor and Trend -Repeat CMP in the AM   DVT prophylaxis: SCDs Code Status: FULL CODE  Family Communication: Discussed with Wife and Daughter at bedside  Disposition Plan: Pending further clinical Improvement   Status is: Inpatient  Remains inpatient appropriate because:IV treatments appropriate due to intensity of illness or inability to take PO and Inpatient level of care appropriate due to severity of illness  Dispo: The patient is from: Home              Anticipated d/c is to: Home              Patient currently is not medically stable to d/c.   Difficult to place patient No  Consultants:  Cardiology Medical Oncology ID Palliative Care Medicine   Procedures:  ECHOCARDIOGRAM  IMPRESSIONS     1. There is severe hypokinesis of the septum that is out of proportion to  postoperative septal movement. This appears new compared with prior. The  findings possibly suggest progression of disease proximal to LIMA-LAD  insertion. Left ventricular ejection   fraction, by estimation, is  50 to 55%. The left ventricle has low normal  function. The left ventricle demonstrates regional wall motion  abnormalities (see scoring diagram/findings for description). There is  mild concentric left ventricular hypertrophy.  Indeterminate diastolic filling due to E-A fusion.   2. Right ventricular systolic function is normal. The right ventricular  size is normal. There is normal pulmonary artery systolic pressure. The  estimated right ventricular systolic pressure is 123XX123 mmHg.   3. The mitral valve is grossly normal. Mild mitral valve regurgitation.  No evidence of mitral stenosis.   4. 25 mm Magna Ease Pericardial bioprosthesis in the aortic position.  Vmax 2.2 m/s, MG 12 mmHG, EOA 1.92 cm2, DI 0.56. The leaflets appear  thickened but there is no increase  in gradients or parameters to suggest  stenosis. The aortic valve has been  repaired/replaced. Aortic valve regurgitation is not visualized. Procedure  Date: 10/26/2017.   5. The inferior vena cava is normal in size with greater than 50%  respiratory variability, suggesting right atrial pressure of 3 mmHg.   Comparison(s): Changes from prior study are noted.   FINDINGS   Left Ventricle: There is severe hypokinesis of the septum that is out of  proportion to postoperative septal movement. This appears new compared  with prior. The findings possibly suggest progression of disease proximal  to LIMA-LAD insertion. Left  ventricular ejection fraction, by estimation, is 50 to 55%. The left  ventricle has low normal function. The left ventricle demonstrates  regional wall motion abnormalities. The left ventricular internal cavity  size was normal in size. There is mild  concentric left ventricular hypertrophy. Abnormal (paradoxical) septal  motion consistent with post-operative status. Indeterminate diastolic  filling due to E-A fusion.      LV Wall Scoring:  The mid and distal anterior septum and inferior septum are hypokinetic.   Right Ventricle: The right ventricular size is normal. No increase in  right ventricular wall thickness. Right ventricular systolic function is  normal. There is normal pulmonary artery systolic pressure. The tricuspid  regurgitant velocity is 2.86 m/s, and   with an assumed right atrial pressure of 3 mmHg, the estimated right  ventricular systolic pressure is 123XX123 mmHg.   Left Atrium: Left atrial size was normal in size.   Right Atrium: Right atrial size was normal in size. Prominent Chiari  network.   Pericardium: There is no evidence of pericardial effusion.   Mitral Valve: The mitral valve is grossly normal. Mild mitral valve  regurgitation. No evidence of mitral valve stenosis.   Tricuspid Valve: The tricuspid valve is grossly normal. Tricuspid  valve  regurgitation is trivial. No evidence of tricuspid stenosis.   Aortic Valve: 25 mm Magna Ease Pericardial bioprosthesis in the aortic  position. Vmax 2.2 m/s, MG 12 mmHG, EOA 1.92 cm2, DI 0.56. The leaflets  appear thickened but there is no increase in gradients or parameters to  suggest stenosis. The aortic valve has  been repaired/replaced. Aortic valve regurgitation is not visualized.  Aortic valve mean gradient measures 13.0 mmHg. Aortic valve peak gradient  measures 19.4 mmHg. Aortic valve area, by VTI measures 1.92 cm. There is  a 25 mm [employer] Ease bovine  valve present in the aortic position.   Pulmonic Valve: The pulmonic valve was grossly normal. Pulmonic valve  regurgitation is trivial. No evidence of pulmonic stenosis.   Aorta: The aortic root and ascending aorta are structurally normal, with  no evidence of dilitation.   Venous: The inferior vena cava  is normal in size with greater than 50%  respiratory variability, suggesting right atrial pressure of 3 mmHg.   IAS/Shunts: The atrial septum is grossly normal.      LEFT VENTRICLE  PLAX 2D  LVIDd:         4.90 cm      Diastology  LVIDs:         3.50 cm      LV e' medial:    6.53 cm/s  LV PW:         1.30 cm      LV E/e' medial:  16.4  LV IVS:        1.20 cm      LV e' lateral:   10.40 cm/s  LVOT diam:     2.10 cm      LV E/e' lateral: 10.3  LV SV:         76  LV SV Index:   37  LVOT Area:     3.46 cm     LV Volumes (MOD)  LV vol d, MOD A4C: 120.0 ml  LV vol s, MOD A4C: 50.2 ml  LV SV MOD A4C:     120.0 ml   RIGHT VENTRICLE  RV Basal diam:  3.20 cm  RV S prime:     9.03 cm/s  TAPSE (M-mode): 2.0 cm   LEFT ATRIUM             Index       RIGHT ATRIUM           Index  LA diam:        4.60 cm 2.23 cm/m  RA Area:     15.30 cm  LA Vol (A2C):   78.4 ml 38.04 ml/m RA Volume:   36.30 ml  17.62 ml/m  LA Vol (A4C):   53.1 ml 25.77 ml/m  LA Biplane Vol: 65.3 ml 31.69 ml/m   AORTIC VALVE  AV Area  (Vmax):    1.79 cm  AV Area (Vmean):   1.54 cm  AV Area (VTI):     1.92 cm  AV Vmax:           220.33 cm/s  AV Vmean:          166.000 cm/s  AV VTI:            0.396 m  AV Peak Grad:      19.4 mmHg  AV Mean Grad:      13.0 mmHg  LVOT Vmax:         114.00 cm/s  LVOT Vmean:        73.700 cm/s  LVOT VTI:          0.220 m  LVOT/AV VTI ratio: 0.56     AORTA  Ao Root diam: 2.90 cm   MITRAL VALVE                TRICUSPID VALVE  MV Area (PHT): 3.93 cm     TR Peak grad:   32.7 mmHg  MV Decel Time: 193 msec     TR Vmax:        286.00 cm/s  MV E velocity: 107.00 cm/s  MV A velocity: 32.50 cm/s   SHUNTS  MV E/A ratio:  3.29         Systemic VTI:  0.22 m  Systemic Diam: 2.10 cm   Antimicrobials:  Anti-infectives (From admission, onward)    Start     Dose/Rate Route Frequency Ordered Stop   07/08/21 1315  cefTRIAXone (ROCEPHIN) 2 g in sodium chloride 0.9 % 100 mL IVPB        2 g 200 mL/hr over 30 Minutes Intravenous Every 24 hours 07/08/21 1221     07/08/21 1000  famciclovir (FAMVIR) tablet 500 mg        500 mg Oral Daily 07/07/21 1521     07/08/21 1000  fluconazole (DIFLUCAN) tablet 100 mg        100 mg Oral Daily 07/07/21 1521     07/07/21 1330  vancomycin (VANCOREADY) IVPB 2000 mg/400 mL        2,000 mg 200 mL/hr over 120 Minutes Intravenous  Once 07/07/21 1256 07/07/21 1737   07/07/21 1300  ceFEPIme (MAXIPIME) 2 g in sodium chloride 0.9 % 100 mL IVPB        2 g 200 mL/hr over 30 Minutes Intravenous  Once 07/07/21 1255 07/07/21 1537        Subjective: Seen and examined at bedside and he had no complaints and had no further bleeding.  States that he is doing fairly well and feels good.  No nausea or vomiting.  Wants to go home.  Denies any chest pain or shortness breath.  No other concerns or complaints at this time.  Objective: Vitals:   07/11/21 1221 07/11/21 2043 07/12/21 0552 07/12/21 1357  BP: 127/63 126/73 121/65 134/64  Pulse: 93 98 90 98   Resp: '17 18 20 '$ (!) 23  Temp: 99 F (37.2 C) 98.6 F (37 C) 98.2 F (36.8 C) 98.6 F (37 C)  TempSrc: Oral Oral Oral Oral  SpO2: 98% 99% 97% 98%  Weight:      Height:        Intake/Output Summary (Last 24 hours) at 07/12/2021 1713 Last data filed at 07/12/2021 1300 Gross per 24 hour  Intake 480 ml  Output 2350 ml  Net -1870 ml    Filed Weights   07/07/21 1918  Weight: 90.2 kg   Examination: Physical Exam:  Constitutional: WN/WD overweight chronically ill appearing Caucasian male in NAD and appears calm and comfortable Eyes: Lids and conjunctivae normal, sclerae anicteric  ENMT: External Ears, Nose appear normal. Grossly normal hearing.  Neck: Appears normal, supple, no cervical masses, normal ROM, no appreciable thyromegaly; no JVD Respiratory: Diminished to auscultation bilaterally, no wheezing, rales, rhonchi or crackles. Normal respiratory effort and patient is not tachypenic. No accessory muscle use. Unlabored breathing  Cardiovascular: RRR, no murmurs / rubs / gallops. S1 and S2 auscultated. No extremity edema. Abdomen: Soft, non-tender, Distended 2/2 to body habitus. Bowel sounds positive.  GU: Deferred. Musculoskeletal: No clubbing / cyanosis of digits/nails. No joint deformity upper and lower extremities.   Skin: No rashes, lesions, ulcers but has bruising and ecchymosis in the Upper Extremities. No induration; Warm and dry.  Neurologic: CN 2-12 grossly intact with no focal deficits. Romberg sign and cerebellar reflexes not assessed.  Psychiatric: Normal judgment and insight. Alert and oriented x 3. Normal mood and appropriate affect.   Data Reviewed: I have personally reviewed following labs and imaging studies  CBC: Recent Labs  Lab 07/08/21 1138 07/09/21 0506 07/10/21 0316 07/11/21 0355 07/12/21 0400  WBC 0.9* 0.7* 0.8* 0.8* 1.1*  NEUTROABS 0.7* 0.3* 0.4* 0.3* 0.3*  HGB 8.2* 8.0* 7.7* 7.6* 7.3*  HCT 23.9* 23.5* 22.6* 22.7* 22.2*  MCV 86.6 85.1 86.9 89.0  88.8  PLT 12* 10* 11* 13* 13*    Basic Metabolic Panel: Recent Labs  Lab 07/08/21 1138 07/09/21 0506 07/10/21 0316 07/11/21 0355 07/12/21 0400  NA 124* 128* 126* 128* 132*  K 3.4* 3.8 4.0 3.7 3.9  CL 93* 99 97* 98 99  CO2 '22 23 23 23 23  '$ GLUCOSE 195* 130* 133* 143* 147*  BUN '19 15 18 16 18  '$ CREATININE 0.96 0.88 0.82 1.00 0.86  CALCIUM 8.4* 8.3* 8.3* 8.3* 8.9  MG 1.9 1.9 2.0 2.1 2.0  PHOS 4.0 3.2 3.8 4.0 4.2    GFR: Estimated Creatinine Clearance: 86.2 mL/min (by C-G formula based on SCr of 0.86 mg/dL). Liver Function Tests: Recent Labs  Lab 07/08/21 1138 07/09/21 0506 07/10/21 0316 07/11/21 0355 07/12/21 0400  AST 38 44* 62* 48* 39  ALT 39 44 63* 60* 53*  ALKPHOS 54 58 61 60 55  BILITOT 1.0 0.8 0.9 0.7 0.6  PROT 7.0 6.9 6.9 7.0 7.0  ALBUMIN 3.4* 3.1* 3.2* 3.2* 3.2*    No results for input(s): LIPASE, AMYLASE in the last 168 hours. No results for input(s): AMMONIA in the last 168 hours. Coagulation Profile: Recent Labs  Lab 07/08/21 1138  INR 1.2    Cardiac Enzymes: No results for input(s): CKTOTAL, CKMB, CKMBINDEX, TROPONINI in the last 168 hours. BNP (last 3 results) Recent Labs    12/09/20 1629  PROBNP 415*    HbA1C: No results for input(s): HGBA1C in the last 72 hours. CBG: Recent Labs  Lab 07/11/21 1606 07/11/21 2044 07/12/21 0812 07/12/21 1216 07/12/21 1647  GLUCAP 128* 153* 145* 266* 154*    Lipid Profile: No results for input(s): CHOL, HDL, LDLCALC, TRIG, CHOLHDL, LDLDIRECT in the last 72 hours. Thyroid Function Tests: No results for input(s): TSH, T4TOTAL, FREET4, T3FREE, THYROIDAB in the last 72 hours. Anemia Panel: No results for input(s): VITAMINB12, FOLATE, FERRITIN, TIBC, IRON, RETICCTPCT in the last 72 hours. Sepsis Labs: No results for input(s): PROCALCITON, LATICACIDVEN in the last 168 hours.  Recent Results (from the past 240 hour(s))  Blood culture (routine x 2)     Status: None (Preliminary result)   Collection  Time: 07/07/21 12:40 PM   Specimen: BLOOD  Result Value Ref Range Status   Specimen Description   Final    BLOOD RIGHT ANTECUBITAL Performed at [facility] [address] [place], [place] [address]    Special Requests   Final    BOTTLES DRAWN AEROBIC AND ANAEROBIC Blood Culture adequate volume Performed at [facility] [address], [place], [place] [address]    Culture  Setup Time   Final    GRAM POSITIVE COCCI IN CHAINS IN BOTH AEROBIC AND ANAEROBIC BOTTLES CRITICAL VALUE NOTED.  VALUE IS CONSISTENT WITH PREVIOUSLY REPORTED AND CALLED VALUE.    Culture   Final    GRAM POSITIVE COCCI CULTURE REINCUBATED FOR BETTER GROWTH Performed at [facility], [place] [address] [place], [place] [address]    Report Status PENDING  Incomplete  Blood culture (routine x 2)     Status: None (Preliminary result)   Collection Time: 07/07/21  1:20 PM   Specimen: BLOOD  Result Value Ref Range Status   Specimen Description   Final    BLOOD LEFT ANTECUBITAL Performed at [facility] [address] [place], [place] [address]    Special Requests   Final    BOTTLES DRAWN AEROBIC AND ANAEROBIC  Blood Culture results may not be optimal due to an excessive volume of blood received in culture bottles Performed at [facility] [address], [place], [place] [address]    Culture  Setup Time   Final    GRAM POSITIVE COCCI IN CHAINS IN BOTH AEROBIC AND ANAEROBIC BOTTLES CRITICAL RESULT CALLED TO, READ BACK BY AND VERIFIED WITH: PHARMD MARY TUCKER 07/08/21'@10'$ :56 BY TW    Culture   Final    GRAM POSITIVE COCCI CULTURE REINCUBATED FOR BETTER GROWTH Performed at [facility], [place] [address], [place], [place] [address]    Report Status PENDING  Incomplete  Blood Culture ID Panel (Reflexed)     Status: Abnormal   Collection Time: 07/07/21  1:20 PM  Result Value Ref Range Status   Enterococcus faecalis NOT DETECTED  NOT DETECTED Final   Enterococcus Faecium NOT DETECTED NOT DETECTED Final   Listeria monocytogenes NOT DETECTED NOT DETECTED Final   Staphylococcus species NOT DETECTED NOT DETECTED Final   Staphylococcus aureus (BCID) NOT DETECTED NOT DETECTED Final   Staphylococcus epidermidis NOT DETECTED NOT DETECTED Final   Staphylococcus lugdunensis NOT DETECTED NOT DETECTED Final   Streptococcus species DETECTED (A) NOT DETECTED Final    Comment: Not Enterococcus species, Streptococcus agalactiae, Streptococcus pyogenes, or Streptococcus pneumoniae. CRITICAL RESULT CALLED TO, READ BACK BY AND VERIFIED WITH: PHARMD MARY TUCKER 07/08/21'@10'$ :55 BY TW    Streptococcus agalactiae NOT DETECTED NOT DETECTED Final   Streptococcus pneumoniae NOT DETECTED NOT DETECTED Final   Streptococcus pyogenes NOT DETECTED NOT DETECTED Final   A.calcoaceticus-baumannii NOT DETECTED NOT DETECTED Final   Bacteroides fragilis NOT DETECTED NOT DETECTED Final   Enterobacterales NOT DETECTED NOT DETECTED Final   Enterobacter cloacae complex NOT DETECTED NOT DETECTED Final   Escherichia coli NOT DETECTED NOT DETECTED Final   Klebsiella aerogenes NOT DETECTED NOT DETECTED Final   Klebsiella oxytoca NOT DETECTED NOT DETECTED Final   Klebsiella pneumoniae NOT DETECTED NOT DETECTED Final   Proteus species NOT DETECTED NOT DETECTED Final   Salmonella species NOT DETECTED NOT DETECTED Final   Serratia marcescens NOT DETECTED NOT DETECTED Final   Haemophilus influenzae NOT DETECTED NOT DETECTED Final   Neisseria meningitidis NOT DETECTED NOT DETECTED Final   Pseudomonas aeruginosa NOT DETECTED NOT DETECTED Final   Stenotrophomonas maltophilia NOT DETECTED NOT DETECTED Final   Candida albicans NOT DETECTED NOT DETECTED Final   Candida auris NOT DETECTED NOT DETECTED Final   Candida glabrata NOT DETECTED NOT DETECTED Final   Candida krusei NOT DETECTED NOT DETECTED Final   Candida parapsilosis NOT DETECTED NOT DETECTED Final    Candida tropicalis NOT DETECTED NOT DETECTED Final   Cryptococcus neoformans/gattii NOT DETECTED NOT DETECTED Final    Comment: Performed at [facility], [address] [place], [place] [address]  Culture, blood (routine x 2)     Status: None (Preliminary result)   Collection Time: 07/09/21  5:24 AM   Specimen: BLOOD  Result Value Ref Range Status   Specimen Description   Final    BLOOD RIGHT ARM Performed at [facility] [address] [place], [place] [address]    Special Requests   Final    BOTTLES DRAWN AEROBIC ONLY Blood Culture results may not be optimal due to an excessive volume of blood received in culture bottles Performed at [facility] [address] [place], [place] [address]    Culture   Final    NO GROWTH 3  DAYS Performed at [facility], [place] [address], [place], [place] [address]    Report Status PENDING  Incomplete  Culture, blood (routine x 2)     Status: None (Preliminary result)   Collection Time: 07/09/21  5:24 AM   Specimen: BLOOD RIGHT HAND  Result Value Ref Range Status   Specimen Description   Final    BLOOD RIGHT HAND Performed at [facility] [address] [place], [place] [address]    Special Requests   Final    BOTTLES DRAWN AEROBIC ONLY Blood Culture adequate volume Performed at [facility] [address], [place], [place] [address]    Culture   Final    NO GROWTH 3 DAYS Performed at [facility], [place] [address], [place], [place] [address]    Report Status PENDING  Incomplete     RN Pressure Injury Documentation:     Estimated body mass index is 29.36 kg/m as calculated from the following:   Height as of this encounter: '5\' 9"'$  (1.753 m).   Weight as of this encounter: 90.2 kg.  Malnutrition Type:  Nutrition Problem: Increased nutrient needs Etiology: chronic illness, cancer and cancer related treatments   Malnutrition  Characteristics:  Signs/Symptoms: estimated needs   Nutrition Interventions:  Interventions: Premier Protein, MVI   Radiology Studies: No results found.  Scheduled Meds:  aminocaproic acid  10 mL Oral Q6H   aminocaproic acid  1,000 mg Oral Q8H   Chlorhexidine Gluconate Cloth  6 each Topical Daily   epoetin alfa  40,000 Units Subcutaneous Once   famciclovir  500 mg Oral Daily   fluconazole  100 mg Oral Daily   fluticasone  2 spray Each Nare Daily   gabapentin  600 mg Oral QHS   insulin aspart  0-15 Units Subcutaneous TID WC   loratadine  10 mg Oral q morning   metoprolol tartrate  12.5 mg Oral BID   multivitamin with minerals  1 tablet Oral q morning   pantoprazole  40 mg Oral Daily   polyethylene glycol  17 g Oral Daily   Ensure Max Protein  11 oz Oral BID   sodium chloride flush  10-40 mL Intracatheter Q12H   Continuous Infusions:  cefTRIAXone (ROCEPHIN)  IV 2 g (07/12/21 1310)    LOS: 5 days   Kerney Elbe, DO [facility] PAGER is on AMION  If 7PM-7AM, please contact night-coverage [email or web address]

## 2021-07-12 NOTE — Progress Notes (Addendum)
Progress Note  Patient Name: Theodore Jenkins Date of Encounter: 07/12/2021  [facility] Cardiologist: Lauree Chandler, MD   Subjective   No chest pain or SOB up in chair  Inpatient Medications    Scheduled Meds:  aminocaproic acid  10 mL Oral Q6H   aminocaproic acid  1,000 mg Oral Q8H   Chlorhexidine Gluconate Cloth  6 each Topical Daily   [START ON 07/16/2021] epoetin alfa  40,000 Units Subcutaneous Once   famciclovir  500 mg Oral Daily   fluconazole  100 mg Oral Daily   fluticasone  2 spray Each Nare Daily   gabapentin  600 mg Oral QHS   insulin aspart  0-15 Units Subcutaneous TID WC   loratadine  10 mg Oral q morning   metoprolol tartrate  12.5 mg Oral BID   multivitamin with minerals  1 tablet Oral q morning   pantoprazole  40 mg Oral Daily   polyethylene glycol  17 g Oral Daily   Ensure Max Protein  11 oz Oral BID   sodium chloride flush  10-40 mL Intracatheter Q12H   Continuous Infusions:  cefTRIAXone (ROCEPHIN)  IV 2 g (07/11/21 1250)   PRN Meds: acetaminophen, LORazepam, nitroGLYCERIN, ondansetron **OR** ondansetron (ZOFRAN) IV, sodium chloride flush   Vital Signs    Vitals:   07/11/21 0413 07/11/21 1221 07/11/21 2043 07/12/21 0552  BP: 119/72 127/63 126/73 121/65  Pulse: 87 93 98 90  Resp: '18 17 18 20  '$ Temp: 98.4 F (36.9 C) 99 F (37.2 C) 98.6 F (37 C) 98.2 F (36.8 C)  TempSrc: Oral Oral Oral Oral  SpO2: 95% 98% 99% 97%  Weight:      Height:        Intake/Output Summary (Last 24 hours) at 07/12/2021 0843 Last data filed at 07/12/2021 0500 Gross per 24 hour  Intake 100 ml  Output 2450 ml  Net -2350 ml   Last 3 Weights 07/07/2021 06/28/2021 06/08/2021  Weight (lbs) 198 lb 12.8 oz 206 lb 2.1 oz 213 lb  Weight (kg) 90.175 kg 93.5 kg 96.616 kg      Telemetry    SR with occ PVC - Personally Reviewed  ECG    No new - Personally Reviewed  Physical Exam   GEN: No acute distress.   Neck: No JVD Cardiac: RRR, no murmurs, rubs, or gallops.   Respiratory: Clear to auscultation bilaterally. GI: Soft, nontender, non-distended  MS: No edema; No deformity. Neuro:  Nonfocal  Psych: Normal affect   Labs    High Sensitivity Troponin:   Recent Labs  Lab 07/07/21 1106 07/07/21 1321 07/07/21 1711 07/07/21 1909  TROPONINIHS 446* 483* 486* 499*      Chemistry Recent Labs  Lab 07/10/21 0316 07/11/21 0355 07/12/21 0400  NA 126* 128* 132*  K 4.0 3.7 3.9  CL 97* 98 99  CO2 '23 23 23  '$ GLUCOSE 133* 143* 147*  BUN '18 16 18  '$ CREATININE 0.82 1.00 0.86  CALCIUM 8.3* 8.3* 8.9  PROT 6.9 7.0 7.0  ALBUMIN 3.2* 3.2* 3.2*  AST 62* 48* 39  ALT 63* 60* 53*  ALKPHOS 61 60 55  BILITOT 0.9 0.7 0.6  GFRNONAA >60 >60 >60  ANIONGAP '6 7 10     '$ Hematology Recent Labs  Lab 07/10/21 0316 07/11/21 0355 07/12/21 0400  WBC 0.8* 0.8* 1.1*  RBC 2.60* 2.55* 2.50*  HGB 7.7* 7.6* 7.3*  HCT 22.6* 22.7* 22.2*  MCV 86.9 89.0 88.8  MCH 29.6 29.8 29.2  MCHC 34.1 33.5 32.9  RDW 16.4* 16.5* 16.6*  PLT 11* 13* 13*    BNP Recent Labs  Lab 07/07/21 1106  BNP 825.3*     DDimer No results for input(s): DDIMER in the last 168 hours.   Radiology    No results found.  Cardiac Studies   Echo 07/08/2021 . There is severe hypokinesis of the septum that is out of proportion to  postoperative septal movement. This appears new compared with prior. The  findings possibly suggest progression of disease proximal to LIMA-LAD  insertion. Left ventricular ejection   fraction, by estimation, is 50 to 55%. The left ventricle has low normal  function. The left ventricle demonstrates regional wall motion  abnormalities (see scoring diagram/findings for description). There is  mild concentric left ventricular hypertrophy.  Indeterminate diastolic filling due to E-A fusion.   2. Right ventricular systolic function is normal. The right ventricular  size is normal. There is normal pulmonary artery systolic pressure. The  estimated right ventricular  systolic pressure is 123XX123 mmHg.   3. The mitral valve is grossly normal. Mild mitral valve regurgitation.  No evidence of mitral stenosis.   4. 25 mm Magna Ease Pericardial bioprosthesis in the aortic position.  Vmax 2.2 m/s, MG 12 mmHG, EOA 1.92 cm2, DI 0.56. The leaflets appear  thickened but there is no increase in gradients or parameters to suggest  stenosis. The aortic valve has been  repaired/replaced. Aortic valve regurgitation is not visualized. Procedure  Date: 10/26/2017.   5. The inferior vena cava is normal in size with greater than 50%  respiratory variability, suggesting right atrial pressure of 3 mmHg.   Comparison(s): Changes from prior study are noted.  Patient Profile     75 y.o. male with PMH of CAD s/p 2v CABG (LIMA-LAD, SVG-OM) 2018, bioprosthetic AVR at the time of bypass, history of CVA, carotid artery disease s/p R CEA and eventual occlusion of the right carotid artery, AAA, HTN, HLD, DM II and significant myelodysplastic disorder presented with chest pain. Symptom is more noticeable laying down when compare to physical exertion. Improved after diuresis. Echo showed septal hypokinesis. Blood culture  Assessment & Plan    Demand NSTEMI in setting of anemia and volume overload, -hx of CAD with prior stents then CABG in 2018 LIMA to dLAD, SVG to 1st OM of LCX. with severe AS with AV Replacement with [facility] Ease Pericardial tissue valve 25 mm (stable on this echo) -ST changes in ant leads improved with blood and platelet transfusion -new LAD territory WMA, limited options in setting of MDA  Bacteremia- streptococcus --heart murmur and s/p SAVR -TEE would be approp but with thrombocytopenia not a cadidate -ABX per IM/ID  CAD see above on BB and Crestor  Severe Thrombocytopenia/myelodysplastic syndrome with pancytopenia --per onc --has rec'd 3 units of PRBCs and 1 unit of plt.  --hgb 7.3 today WBC 1.1   AKI recently off ACE/ARB per IM now Cr 0.86    Hyponatremia/hypokalemia per IM      For questions or updates, please contact [facility] Please consult [email or web address] for contact info under        Signed, Cecilie Kicks, NP  07/12/2021, 8:44 AM    Patient seen and examined with Cecilie Kicks, NP.  Agree as above, with the following exceptions and changes as noted below.  Patient seen today sitting in the bedside chair, wife at the bedside.  He has an excellent understanding of his current medical condition and has  no active cardiovascular concerns.  We discussed limitations from a hematologic standpoint for proceeding with TEE, they feel this has been explained well to them previously and have no additional questions.. Gen: NAD, CV: RRR Lungs: No increased work of breathing, Extrem: Warm, well perfused, no edema, Neuro/Psych: alert and oriented x 3, normal mood and affect. All available labs, radiology testing, previous records reviewed.  Continued management per primary team, hematology oncology, and infectious diseases.  No updated recommendations from a cardiovascular perspective at this time.   In light of that, cardiology will sign off, however do not hesitate to contact our service during the patient's hospital stay if we can be of any further assistance.     Elouise Munroe, MD 07/12/21 12:59 PM

## 2021-07-12 NOTE — Progress Notes (Signed)
Patient's critical lab WBC-1.1, Platelet-13, Neutro 0.3 consistent with previous values.

## 2021-07-12 NOTE — Progress Notes (Signed)
Mr. Egnew had a good weekend.  He has not been transfused for several days.  Hopefully this is a indicator that things might be helping with respect to his bone marrow.  We still do not have the identification for the Streptococcus.  As such, I do not know what kind or how long he will need antibiotics.  Has been afebrile.  He has had no bleeding.  He has had bowel movements.  He has had no hematuria.  His labs show white cell count of 1.1.  Hemoglobin 7.5.  Platelet count 13,000.  His appetite is doing better.  He has had no nausea or vomiting.  He is getting some physical therapy.  I think this is fantastic.  His vital signs are temperature 98.2.  Pulse 90.  Blood pressure 121/65.  His oral exam shows resolution of the hematoma on the lower lip.  There is no intraoral lesions.  Lungs are clear.  Cardiac exam slightly tachycardic.  He has an occasional extra beat.  He has a 2/6 systolic murmur.  Abdomen is soft.  He has good bowel sounds.  There is no fluid wave.  There is no palpable liver or spleen tip.  Extremity shows no clubbing, cyanosis or edema.  Again, we will hold off on his transfusions today.  We will have to see what the identification is of this Streptococcus in his blood.  Again I do not see any reason that his Port-A-Cath has to come out.  Hopefully, he will be able to go home soon.  I appreciate the outstanding care he is getting from all the staff on [address].  Lattie Haw, MD  [place] 11:24

## 2021-07-13 DIAGNOSIS — D649 Anemia, unspecified: Secondary | ICD-10-CM | POA: Diagnosis not present

## 2021-07-13 DIAGNOSIS — D696 Thrombocytopenia, unspecified: Secondary | ICD-10-CM | POA: Diagnosis not present

## 2021-07-13 DIAGNOSIS — D469 Myelodysplastic syndrome, unspecified: Secondary | ICD-10-CM | POA: Diagnosis not present

## 2021-07-13 LAB — CBC WITH DIFFERENTIAL/PLATELET
Abs Immature Granulocytes: 0.24 10*3/uL — ABNORMAL HIGH (ref 0.00–0.07)
Basophils Absolute: 0 10*3/uL (ref 0.0–0.1)
Basophils Relative: 0 %
Eosinophils Absolute: 0 10*3/uL (ref 0.0–0.5)
Eosinophils Relative: 0 %
HCT: 20.9 % — ABNORMAL LOW (ref 39.0–52.0)
Hemoglobin: 6.8 g/dL — CL (ref 13.0–17.0)
Immature Granulocytes: 23 %
Lymphocytes Relative: 31 %
Lymphs Abs: 0.3 10*3/uL — ABNORMAL LOW (ref 0.7–4.0)
MCH: 29.3 pg (ref 26.0–34.0)
MCHC: 32.5 g/dL (ref 30.0–36.0)
MCV: 90.1 fL (ref 80.0–100.0)
Monocytes Absolute: 0.2 10*3/uL (ref 0.1–1.0)
Monocytes Relative: 17 %
Neutro Abs: 0.3 10*3/uL — CL (ref 1.7–7.7)
Neutrophils Relative %: 29 %
Platelets: 19 10*3/uL — CL (ref 150–400)
RBC: 2.32 MIL/uL — ABNORMAL LOW (ref 4.22–5.81)
RDW: 17.2 % — ABNORMAL HIGH (ref 11.5–15.5)
WBC: 1 10*3/uL — CL (ref 4.0–10.5)
nRBC: 10.7 % — ABNORMAL HIGH (ref 0.0–0.2)

## 2021-07-13 LAB — COMPREHENSIVE METABOLIC PANEL
ALT: 44 U/L (ref 0–44)
AST: 34 U/L (ref 15–41)
Albumin: 3.2 g/dL — ABNORMAL LOW (ref 3.5–5.0)
Alkaline Phosphatase: 59 U/L (ref 38–126)
Anion gap: 6 (ref 5–15)
BUN: 18 mg/dL (ref 8–23)
CO2: 24 mmol/L (ref 22–32)
Calcium: 8.5 mg/dL — ABNORMAL LOW (ref 8.9–10.3)
Chloride: 101 mmol/L (ref 98–111)
Creatinine, Ser: 0.92 mg/dL (ref 0.61–1.24)
GFR, Estimated: >60 mL/min (ref >60)
Glucose, Bld: 155 mg/dL — ABNORMAL HIGH (ref 70–99)
Potassium: 3.7 mmol/L (ref 3.5–5.1)
Sodium: 131 mmol/L — ABNORMAL LOW (ref 135–145)
Total Bilirubin: 0.7 mg/dL (ref 0.3–1.2)
Total Protein: 7.1 g/dL (ref 6.5–8.1)

## 2021-07-13 LAB — MAGNESIUM: Magnesium: 2.1 mg/dL (ref 1.7–2.4)

## 2021-07-13 LAB — GLUCOSE, CAPILLARY
Glucose-Capillary: 143 mg/dL — ABNORMAL HIGH (ref 70–99)
Glucose-Capillary: 278 mg/dL — ABNORMAL HIGH (ref 70–99)
Glucose-Capillary: 205 mg/dL — ABNORMAL HIGH (ref 70–99)
Glucose-Capillary: 181 mg/dL — ABNORMAL HIGH (ref 70–99)

## 2021-07-13 LAB — HEMOGLOBIN AND HEMATOCRIT, BLOOD
HCT: 25.7 % — ABNORMAL LOW (ref 39.0–52.0)
Hemoglobin: 8.4 g/dL — ABNORMAL LOW (ref 13.0–17.0)

## 2021-07-13 LAB — PHOSPHORUS: Phosphorus: 3.8 mg/dL (ref 2.5–4.6)

## 2021-07-13 LAB — PREPARE RBC (CROSSMATCH)

## 2021-07-13 MED ORDER — SODIUM CHLORIDE 0.9% IV SOLUTION: Freq: Once | INTRAVENOUS | Status: DC

## 2021-07-13 NOTE — Progress Notes (Signed)
PROGRESS NOTE    Theodore Jenkins  [id number] DOB: May 12, 1946 DOA: 07/07/2021 PCP: Lezlie Octave, PA-C   Brief Narrative:  The patient is a 75 year old Caucasian overweight male with a past medical history significant for but not limited to myelodysplastic syndrome, history of CAD status post CABG, history of diabetes mellitus type 2, hypertension, history of aortic stenosis status post aortic valve replacement as well as a history of Streptococcus bacteremia with presumed endocarditis last year of the prosthetic valve who presented to the ED for episodes of hematuria, chest pressure, epistaxis as well as bleeding from his gums.  He was recently discharged from the hospital on 07/03/2021 after he was treated for severe epistaxis related to severe thrombocytopenia.  At that time he transfused 5 packs of platelets during that hospitalization received packed RBCs.  Subsequently started developing chest heaviness denied any radiation to his arms or neck.  He denied any nausea or vomiting no diaphoresis.  His stools are black in color but denies any hematemesis or hematochezia.  He was admitted for the evaluation of his hematuria and heaviness in his chest and oncology and cardiology were also consulted.  He has a history of severe myelodysplastic syndrome and recently discharged for epistaxis.  During his hospitalization he was found to have a hemoglobin of 4.3 and a platelet count down to 6000 so he was transfused 3 units of PRBCs and 2 packs of platelets.  Medical oncology evaluated and feel he has had very little response to chemotherapy and they recommended giving the patient some Amicar mouth rinse and pending this hospitalization they may discuss hospice with him.  Cardiology was consulted for his chest heaviness and chest pain likely in the setting of demand ischemia from low hemoglobin.  Patient was given IV Lasix yesterday with improvement in his symptoms.  Subsequent work-up found that the  patient has a Streptococcus bacteremic and given his history of streptococcal bacteremia with presumed endocarditis ID was consulted and patient was placed on IV ceftriaxone.  Because his platelet count was 10,000 on 07/09/21 Hematology evaluated and recommending some Nplate as well as Aransep.  They are recommending decreasing the amount of Amicar mouth rinse.  Cardiology evaluating and his echo did show some hypokinesis of the septum which is more prominent than his prior echo.  He is not a candidate at this time and no longer having chest pain and they are recommending continue to treat medically.  Patient does have a Streptococcus bacteremia (GPC with Organism ID pending) and there seems to be relief of obstruction of the anterior cusp of the wall thickening.  Unfortunately patient cannot have a TEE secondary to severe thrombocytopenia and bleeding and cardiology may recommend continuing antibiotic treatments.  ID recommending 6 weeks of IV antibiotics and removal of his current Port-A-Cath but Heme/Onc and Patient don't want Port-A-Cath Removed.  We will continue IV ceftriaxone for now and can narrow to cefazolin based on sensitivities when resulted.  We will consult palliative care for further goals of care discussion but this is still pending.   On 07/10/21 his Platelet Count went to 11 but he has no bleeding and Onc recommending holding off on transfusion and continuing to monitor his Blood Counts over the weekend.  Last few days his Platelet Count was 13 and today was 19. Cxs are still pending and I called the micro lab and sensitivities are still pending and they are replating the cultures and likely going to be sending to [facility].  I discussed  the case with ID who will likely send the patient home with IV ceftriaxone regardless of cultures still pending and they are recommending Discharging Home 07/14/21.  Assessment & Plan:   Principal Problem:   Chest pain Active Problems:   Essential  hypertension   S/P aortic valve replacement with bioprosthetic valve    S/P CABG x 2   Hyponatremia   Pancytopenia (HCC)   MDS (myelodysplastic syndrome), high grade (HCC)   OSA on CPAP   Thrombocytopenia (HCC)   Acute blood loss anemia (ABLA)   Bacteremia due to Gram-positive bacteria   MDS (myelodysplastic syndrome) (HCC)   Non-ST elevation (NSTEMI) myocardial infarction (HCC)  Chest pain with Dyspena, improved -Concerning for Type 2 non-ST elevation MI secondary to demand ischemia from severe anemia -Patient has a history of coronary artery disease and is status post CABG x 2 -Twelve-lead EKG reviewed by me shows an incomplete left bundle branch block with nonspecific ST-T wave changes -Will cycle cardiac enzymes -Patient is unable to receive aspirin or anticoagulation due to severe anemia requiring blood transfusion -Will obtain 2D echocardiogram to rule out regional wall motion abnormality; ECHO as below shows "There is severe hypokinesis of the septum that is out of proportion to postoperative septal movement. This  appears new compared with prior. The findings possibly suggest progression of disease proximal to LIMA-LAD  insertion. Left ventricular ejection fraction, by estimation, is 50 to 55%. The left ventricle has low normal function. The left ventricle demonstrates regional wall motion abnormalities (see scoring  diagram/findings for description). There is mild concentric left ventricular hypertrophy."  -We will transfuse 3 units of packed RBC for symptomatic anemia; Given Lasix with improvement  -Continue statins and reduced dose of beta-blocker to 12.5 mg twice daily -GI evaluated and they feel that given his significant hematuria and severe anemia as well as severe thrombocytopenia he would not be a good candidate for interventional measures and they evaluated and felt that his chest pain was mainly noticeable when he is laying down and did not occur with walk around.  They feel  that his symptoms only happened after he received blood and after he was given IV Lasix yesterday he had not any further chest discomfort -Cardiology evaluated EKG and they saw significant ST depression in the setting of severe anemia -Cardiology feels patient had demand ischemia in the setting of pulmonary edema as well as severe anemia with hemoglobin 4.3 and recommending continue metoprolol tartrate but avoiding heparin, aspirin or other antiplatelet given severe thrombocytopenia -Cardiology recommended goal-directed medical therapy with Crestor and metoprolol but because the patient had a recent AKI he is off his ACE inhibitor -Not a Candidate for For Invasive Studies; No longer having Chest Pain and EF is only minimally reduced    Myelodysplastic Syndrome with Pancytopenia -Severe anemia/thrombocytopenia with acute blood loss anemia secondary to hematuria, epistaxis and mucosal bleeding from his gums -Patient noted to have a hemoglobin of 4.3g/dl down from 7.4g/dl, 2 days ago -He also has platelet count of 6000 on Admission -Now WBC is 1.0, Hgb/Hct is 6.8/20.9, and Platelet Count is 19 -He is status posttransfusion of 3 units of packed RBC as well as 2 packs of platelets; He will get another 1 unit of pRBC's and repeat Hgb/Hct was 8.4/25.7 -Will consult Hematology/Oncology and Dr. Marin Olp consulted  -Pending patient's clinical course patient may be hospice appropriate and medical oncology does not feel that he has been appropriately responding to the chemotherapy -Oncology Trying Nplate and Aransep and this  was given on 07/09/2021: Now oncology recommending holding off on transfusions -Consult Palliative for further GOC Discussion and this is still pending to be happening -PT/OT recommending no Follow up  Streptococcus Bacteremia -Blood Cx x2 showed GPC Streptococcus Species with Organism Identification pending  -Has a Hx of Streptococcus Bacteremia and Endocarditis (Has a Prosthetic Aortic  Valve) -ECHO Done but he is not a good candidate for TEE Given Pancytopenia -He has a Port-A-Cath in place and this has been placed since January and there is concern for line infection and ID recommending considering line removal and depending on prognosis and what the plan is going forward if he chooses aggressive measures and not hospice he will need to consider port removal -ID has started the patient IV ceftriaxone and Repeat Blood Cx showing NGTD at 1 Day  -ID recommending that if the patient wants aggressive care that he will need 6 weeks of IV Abx and removal of his Port-a-Cath and continuing IV Ceftriaxone for now but Patient and Onc do not want Port-A-Cath removal. Will discuss with ID once Sensitivities have resulted and they are still pending.  I spoke with micro and they are replacing the cultures and sending the cultures to [facility] tomorrow; ID now recommending to discontinue IV ceftriaxone through the port and they will arrange home health for antibiotic management and anticipate discharge is 07/14/2021 per my discussion with Dr. Juleen China -ID recommends continuing the same with IV ceftriaxone and await sensitivities so that they may be little narrow to penicillin; patient is not a aminoglycoside candidate given that he had gentamicin previously and so there is no plan for beta-lactam monotherapy for least 6 weeks and then probably start p.o. after   Gross Hematuria, improved  -Most likely secondary to severe thrombocytopenia -Monitor closely as it is improving    HTN -Continue low-dose beta-blocker -Continue to monitor blood pressure closely -Continue to monitor blood pressures per protocol -Last blood pressure reading was 122/60  Abnormal LFTs  -Improved.  Now AST is 34 and ALT is 44 -Continue to monitor and trend hepatic function panel and if necessary will obtain a right upper quadrant ultrasound as well as an acute hepatitis panel -Repeat CMP in the a.m.  Hypokalemia -Patient's  Potassium is now 3.7 -Continue monitor and replete as necessary -Mag level was 2.1 -Repeat CMP in the a.m.  DM2 -Maintain consistent carbohydrate diet -Continue with sliding scale insulin for glycemic control -CBG been ranging from 143-278 on last 3 CBGs; Glucose on CMP was 155  Hyponatremia -Patient's Na+ went from 127 -> 124 -> 128 -> 126 -> 128 -> 132 -> 131 -Continue to Monitor and Trend -Repeat CMP in the AM   DVT prophylaxis: SCDs Code Status: FULL CODE  Family Communication: Discussed with Wife at bedside  Disposition Plan: Pending further clinical Improvement   Status is: Inpatient  Remains inpatient appropriate because:IV treatments appropriate due to intensity of illness or inability to take PO and Inpatient level of care appropriate due to severity of illness  Dispo: The patient is from: Home              Anticipated d/c is to: Home              Patient currently is not medically stable to d/c.   Difficult to place patient No  Consultants:  Cardiology Medical Oncology ID Palliative Care Medicine   Procedures:  ECHOCARDIOGRAM  IMPRESSIONS     1. There is severe hypokinesis of the septum that is  out of proportion to  postoperative septal movement. This appears new compared with prior. The  findings possibly suggest progression of disease proximal to LIMA-LAD  insertion. Left ventricular ejection   fraction, by estimation, is 50 to 55%. The left ventricle has low normal  function. The left ventricle demonstrates regional wall motion  abnormalities (see scoring diagram/findings for description). There is  mild concentric left ventricular hypertrophy.  Indeterminate diastolic filling due to E-A fusion.   2. Right ventricular systolic function is normal. The right ventricular  size is normal. There is normal pulmonary artery systolic pressure. The  estimated right ventricular systolic pressure is 123XX123 mmHg.   3. The mitral valve is grossly normal. Mild mitral  valve regurgitation.  No evidence of mitral stenosis.   4. 25 mm Magna Ease Pericardial bioprosthesis in the aortic position.  Vmax 2.2 m/s, MG 12 mmHG, EOA 1.92 cm2, DI 0.56. The leaflets appear  thickened but there is no increase in gradients or parameters to suggest  stenosis. The aortic valve has been  repaired/replaced. Aortic valve regurgitation is not visualized. Procedure  Date: 10/26/2017.   5. The inferior vena cava is normal in size with greater than 50%  respiratory variability, suggesting right atrial pressure of 3 mmHg.   Comparison(s): Changes from prior study are noted.   FINDINGS   Left Ventricle: There is severe hypokinesis of the septum that is out of  proportion to postoperative septal movement. This appears new compared  with prior. The findings possibly suggest progression of disease proximal  to LIMA-LAD insertion. Left  ventricular ejection fraction, by estimation, is 50 to 55%. The left  ventricle has low normal function. The left ventricle demonstrates  regional wall motion abnormalities. The left ventricular internal cavity  size was normal in size. There is mild  concentric left ventricular hypertrophy. Abnormal (paradoxical) septal  motion consistent with post-operative status. Indeterminate diastolic  filling due to E-A fusion.      LV Wall Scoring:  The mid and distal anterior septum and inferior septum are hypokinetic.   Right Ventricle: The right ventricular size is normal. No increase in  right ventricular wall thickness. Right ventricular systolic function is  normal. There is normal pulmonary artery systolic pressure. The tricuspid  regurgitant velocity is 2.86 m/s, and   with an assumed right atrial pressure of 3 mmHg, the estimated right  ventricular systolic pressure is 123XX123 mmHg.   Left Atrium: Left atrial size was normal in size.   Right Atrium: Right atrial size was normal in size. Prominent Chiari  network.   Pericardium: There is  no evidence of pericardial effusion.   Mitral Valve: The mitral valve is grossly normal. Mild mitral valve  regurgitation. No evidence of mitral valve stenosis.   Tricuspid Valve: The tricuspid valve is grossly normal. Tricuspid valve  regurgitation is trivial. No evidence of tricuspid stenosis.   Aortic Valve: 25 mm Magna Ease Pericardial bioprosthesis in the aortic  position. Vmax 2.2 m/s, MG 12 mmHG, EOA 1.92 cm2, DI 0.56. The leaflets  appear thickened but there is no increase in gradients or parameters to  suggest stenosis. The aortic valve has  been repaired/replaced. Aortic valve regurgitation is not visualized.  Aortic valve mean gradient measures 13.0 mmHg. Aortic valve peak gradient  measures 19.4 mmHg. Aortic valve area, by VTI measures 1.92 cm. There is  a 25 mm [employer] Ease bovine  valve present in the aortic position.   Pulmonic Valve: The pulmonic valve was grossly normal.  Pulmonic valve  regurgitation is trivial. No evidence of pulmonic stenosis.   Aorta: The aortic root and ascending aorta are structurally normal, with  no evidence of dilitation.   Venous: The inferior vena cava is normal in size with greater than 50%  respiratory variability, suggesting right atrial pressure of 3 mmHg.   IAS/Shunts: The atrial septum is grossly normal.      LEFT VENTRICLE  PLAX 2D  LVIDd:         4.90 cm      Diastology  LVIDs:         3.50 cm      LV e' medial:    6.53 cm/s  LV PW:         1.30 cm      LV E/e' medial:  16.4  LV IVS:        1.20 cm      LV e' lateral:   10.40 cm/s  LVOT diam:     2.10 cm      LV E/e' lateral: 10.3  LV SV:         76  LV SV Index:   37  LVOT Area:     3.46 cm     LV Volumes (MOD)  LV vol d, MOD A4C: 120.0 ml  LV vol s, MOD A4C: 50.2 ml  LV SV MOD A4C:     120.0 ml   RIGHT VENTRICLE  RV Basal diam:  3.20 cm  RV S prime:     9.03 cm/s  TAPSE (M-mode): 2.0 cm   LEFT ATRIUM             Index       RIGHT ATRIUM           Index   LA diam:        4.60 cm 2.23 cm/m  RA Area:     15.30 cm  LA Vol (A2C):   78.4 ml 38.04 ml/m RA Volume:   36.30 ml  17.62 ml/m  LA Vol (A4C):   53.1 ml 25.77 ml/m  LA Biplane Vol: 65.3 ml 31.69 ml/m   AORTIC VALVE  AV Area (Vmax):    1.79 cm  AV Area (Vmean):   1.54 cm  AV Area (VTI):     1.92 cm  AV Vmax:           220.33 cm/s  AV Vmean:          166.000 cm/s  AV VTI:            0.396 m  AV Peak Grad:      19.4 mmHg  AV Mean Grad:      13.0 mmHg  LVOT Vmax:         114.00 cm/s  LVOT Vmean:        73.700 cm/s  LVOT VTI:          0.220 m  LVOT/AV VTI ratio: 0.56     AORTA  Ao Root diam: 2.90 cm   MITRAL VALVE                TRICUSPID VALVE  MV Area (PHT): 3.93 cm     TR Peak grad:   32.7 mmHg  MV Decel Time: 193 msec     TR Vmax:        286.00 cm/s  MV E velocity: 107.00 cm/s  MV A velocity: 32.50 cm/s   SHUNTS  MV E/A ratio:  3.29  Systemic VTI:  0.22 m                              Systemic Diam: 2.10 cm   Antimicrobials:  Anti-infectives (From admission, onward)    Start     Dose/Rate Route Frequency Ordered Stop   07/08/21 1315  cefTRIAXone (ROCEPHIN) 2 g in sodium chloride 0.9 % 100 mL IVPB        2 g 200 mL/hr over 30 Minutes Intravenous Every 24 hours 07/08/21 1221     07/08/21 1000  famciclovir (FAMVIR) tablet 500 mg        500 mg Oral Daily 07/07/21 1521     07/08/21 1000  fluconazole (DIFLUCAN) tablet 100 mg        100 mg Oral Daily 07/07/21 1521     07/07/21 1330  vancomycin (VANCOREADY) IVPB 2000 mg/400 mL        2,000 mg 200 mL/hr over 120 Minutes Intravenous  Once 07/07/21 1256 07/07/21 1737   07/07/21 1300  ceFEPIme (MAXIPIME) 2 g in sodium chloride 0.9 % 100 mL IVPB        2 g 200 mL/hr over 30 Minutes Intravenous  Once 07/07/21 1255 07/07/21 1537        Subjective: Seen and examined at bedside and he felt weaker today and states that he started getting weak yesterday.  No nausea or vomiting.  Denies lightheadedness or dizziness.   Bleeding is resolved.  No other concerns or complaints at this time.  Anticipating discharging home in the next 24 hours pending ID clearance  Objective: Vitals:   07/13/21 0517 07/13/21 0801 07/13/21 0816 07/13/21 1115  BP: 121/66 118/68 (!) 128/58 122/61  Pulse: 92 93 86 89  Resp: '20 20 18 18  '$ Temp: 98.8 F (37.1 C) 98.5 F (36.9 C) 97.8 F (36.6 C) 98.1 F (36.7 C)  TempSrc: Oral Oral Oral Oral  SpO2: 96% 97% 98% 98%  Weight:      Height:        Intake/Output Summary (Last 24 hours) at 07/13/2021 1638 Last data filed at 07/13/2021 1115 Gross per 24 hour  Intake 325 ml  Output 1250 ml  Net -925 ml    Filed Weights   07/07/21 1918  Weight: 90.2 kg   Examination: Physical Exam:  Constitutional: WN/WD overweight chronically ill-appearing Caucasian male, in no acute distress appears calm and comfortable sitting in a chair bedside  Eyes: Lids and conjunctivae normal, sclerae anicteric  ENMT: External Ears, Nose appear normal. Grossly normal hearing.  Neck: Appears normal, supple, no cervical masses, normal ROM, no appreciable thyromegaly; no JVD Respiratory: Diminished to auscultation bilaterally, no wheezing, rales, rhonchi or crackles. Normal respiratory effort and patient is not tachypenic. No accessory muscle use.  Unlabored breathing Cardiovascular: RRR, no murmurs / rubs / gallops. S1 and S2 auscultated. No extremity edema.  Abdomen: Soft, non-tender, distended secondary body habitus. Bowel sounds positive.  GU: Deferred. Musculoskeletal: No clubbing / cyanosis of digits/nails. No joint deformity upper and lower extremities.  Skin: No rashes, lesions, ulcers but has bruising and ecchymosis in upper extremities. No induration; Warm and dry.  Neurologic: CN 2-12 grossly intact with no focal deficits. Romberg sign and cerebellar reflexes not assessed.  Psychiatric: Normal judgment and insight. Alert and oriented x 3. Normal mood and appropriate affect.   Data Reviewed: I  have personally reviewed following labs and imaging studies  CBC: Recent Labs  Lab  07/09/21 0506 07/10/21 0316 07/11/21 0355 07/12/21 0400 07/13/21 0405 07/13/21 1301  WBC 0.7* 0.8* 0.8* 1.1* 1.0*  --   NEUTROABS 0.3* 0.4* 0.3* 0.3* 0.3*  --   HGB 8.0* 7.7* 7.6* 7.3* 6.8* 8.4*  HCT 23.5* 22.6* 22.7* 22.2* 20.9* 25.7*  MCV 85.1 86.9 89.0 88.8 90.1  --   PLT 10* 11* 13* 13* 19*  --     Basic Metabolic Panel: Recent Labs  Lab 07/09/21 0506 07/10/21 0316 07/11/21 0355 07/12/21 0400 07/13/21 0405  NA 128* 126* 128* 132* 131*  K 3.8 4.0 3.7 3.9 3.7  CL 99 97* 98 99 101  CO2 '23 23 23 23 24  '$ GLUCOSE 130* 133* 143* 147* 155*  BUN '15 18 16 18 18  '$ CREATININE 0.88 0.82 1.00 0.86 0.92  CALCIUM 8.3* 8.3* 8.3* 8.9 8.5*  MG 1.9 2.0 2.1 2.0 2.1  PHOS 3.2 3.8 4.0 4.2 3.8    GFR: Estimated Creatinine Clearance: 80.6 mL/min (by C-G formula based on SCr of 0.92 mg/dL). Liver Function Tests: Recent Labs  Lab 07/09/21 0506 07/10/21 0316 07/11/21 0355 07/12/21 0400 07/13/21 0405  AST 44* 62* 48* 39 34  ALT 44 63* 60* 53* 44  ALKPHOS 58 61 60 55 59  BILITOT 0.8 0.9 0.7 0.6 0.7  PROT 6.9 6.9 7.0 7.0 7.1  ALBUMIN 3.1* 3.2* 3.2* 3.2* 3.2*    No results for input(s): LIPASE, AMYLASE in the last 168 hours. No results for input(s): AMMONIA in the last 168 hours. Coagulation Profile: Recent Labs  Lab 07/08/21 1138  INR 1.2    Cardiac Enzymes: No results for input(s): CKTOTAL, CKMB, CKMBINDEX, TROPONINI in the last 168 hours. BNP (last 3 results) Recent Labs    12/09/20 1629  PROBNP 415*    HbA1C: No results for input(s): HGBA1C in the last 72 hours. CBG: Recent Labs  Lab 07/12/21 1216 07/12/21 1647 07/12/21 2005 07/13/21 0800 07/13/21 1113  GLUCAP 266* 154* 193* 143* 278*    Lipid Profile: No results for input(s): CHOL, HDL, LDLCALC, TRIG, CHOLHDL, LDLDIRECT in the last 72 hours. Thyroid Function Tests: No results for input(s): TSH, T4TOTAL, FREET4, T3FREE,  THYROIDAB in the last 72 hours. Anemia Panel: No results for input(s): VITAMINB12, FOLATE, FERRITIN, TIBC, IRON, RETICCTPCT in the last 72 hours. Sepsis Labs: No results for input(s): PROCALCITON, LATICACIDVEN in the last 168 hours.  Recent Results (from the past 240 hour(s))  Blood culture (routine x 2)     Status: None (Preliminary result)   Collection Time: 07/07/21 12:40 PM   Specimen: BLOOD  Result Value Ref Range Status   Specimen Description   Final    BLOOD RIGHT ANTECUBITAL Performed at [facility] [address] [place], [place] [address]    Special Requests   Final    BOTTLES DRAWN AEROBIC AND ANAEROBIC Blood Culture adequate volume Performed at [facility] [address], [place], [place] [address]    Culture  Setup Time   Final    GRAM POSITIVE COCCI IN CHAINS IN BOTH AEROBIC AND ANAEROBIC BOTTLES CRITICAL VALUE NOTED.  VALUE IS CONSISTENT WITH PREVIOUSLY REPORTED AND CALLED VALUE.    Culture   Final    GRAM POSITIVE COCCI CULTURE REINCUBATED FOR BETTER GROWTH Performed at [facility], [place] [address] [place], [place] [address]    Report Status PENDING  Incomplete  Blood culture (routine x 2)     Status: None (Preliminary result)   Collection Time: 07/07/21  1:20 PM   Specimen: BLOOD  Result Value Ref Range Status   Specimen Description   Final    BLOOD LEFT ANTECUBITAL Performed at [facility] [address] [place], [place] [address]    Special Requests   Final    BOTTLES DRAWN AEROBIC AND ANAEROBIC Blood Culture results may not be optimal due to an excessive volume of blood received in culture bottles Performed at [facility] [address] [place], [place] [address]    Culture  Setup Time   Final    GRAM POSITIVE COCCI IN CHAINS IN BOTH AEROBIC AND ANAEROBIC BOTTLES CRITICAL RESULT CALLED TO, READ BACK BY AND VERIFIED WITH: PHARMD MARY TUCKER 07/08/21'@10'$ :72 BY  TW    Culture   Final    GRAM POSITIVE COCCI CULTURE REINCUBATED FOR BETTER GROWTH Performed at [facility], [place] [address] [place], [place] [address]    Report Status PENDING  Incomplete  Blood Culture ID Panel (Reflexed)     Status: Abnormal   Collection Time: 07/07/21  1:20 PM  Result Value Ref Range Status   Enterococcus faecalis NOT DETECTED NOT DETECTED Final   Enterococcus Faecium NOT DETECTED NOT DETECTED Final   Listeria monocytogenes NOT DETECTED NOT DETECTED Final   Staphylococcus species NOT DETECTED NOT DETECTED Final   Staphylococcus aureus (BCID) NOT DETECTED NOT DETECTED Final   Staphylococcus epidermidis NOT DETECTED NOT DETECTED Final   Staphylococcus lugdunensis NOT DETECTED NOT DETECTED Final   Streptococcus species DETECTED (A) NOT DETECTED Final    Comment: Not Enterococcus species, Streptococcus agalactiae, Streptococcus pyogenes, or Streptococcus pneumoniae. CRITICAL RESULT CALLED TO, READ BACK BY AND VERIFIED WITH: PHARMD MARY TUCKER 07/08/21'@10'$ :55 BY TW    Streptococcus agalactiae NOT DETECTED NOT DETECTED Final   Streptococcus pneumoniae NOT DETECTED NOT DETECTED Final   Streptococcus pyogenes NOT DETECTED NOT DETECTED Final   A.calcoaceticus-baumannii NOT DETECTED NOT DETECTED Final   Bacteroides fragilis NOT DETECTED NOT DETECTED Final   Enterobacterales NOT DETECTED NOT DETECTED Final   Enterobacter cloacae complex NOT DETECTED NOT DETECTED Final   Escherichia coli NOT DETECTED NOT DETECTED Final   Klebsiella aerogenes NOT DETECTED NOT DETECTED Final   Klebsiella oxytoca NOT DETECTED NOT DETECTED Final   Klebsiella pneumoniae NOT DETECTED NOT DETECTED Final   Proteus species NOT DETECTED NOT DETECTED Final   Salmonella species NOT DETECTED NOT DETECTED Final   Serratia marcescens NOT DETECTED NOT DETECTED Final   Haemophilus influenzae NOT DETECTED NOT DETECTED Final   Neisseria meningitidis NOT DETECTED NOT DETECTED Final   Pseudomonas  aeruginosa NOT DETECTED NOT DETECTED Final   Stenotrophomonas maltophilia NOT DETECTED NOT DETECTED Final   Candida albicans NOT DETECTED NOT DETECTED Final   Candida auris NOT DETECTED NOT DETECTED Final   Candida glabrata NOT DETECTED NOT DETECTED Final   Candida krusei NOT DETECTED NOT DETECTED Final   Candida parapsilosis NOT DETECTED NOT DETECTED Final   Candida tropicalis NOT DETECTED NOT DETECTED Final   Cryptococcus neoformans/gattii NOT DETECTED NOT DETECTED Final    Comment: Performed at [facility], [address] [place], [place] [address]  Culture, blood (routine x 2)     Status: None (Preliminary result)   Collection Time: 07/09/21  5:24 AM   Specimen: BLOOD  Result Value Ref Range Status   Specimen Description   Final    BLOOD RIGHT ARM Performed at [place] [address], [place], [place] [address]    Special Requests  Final    BOTTLES DRAWN AEROBIC ONLY Blood Culture results may not be optimal due to an excessive volume of blood received in culture bottles Performed at [place] [address], [place], [place] [address]    Culture   Final    NO GROWTH 4 DAYS Performed at [facility], [place] [address] [place], [place] [address]    Report Status PENDING  Incomplete  Culture, blood (routine x 2)     Status: None (Preliminary result)   Collection Time: 07/09/21  5:24 AM   Specimen: BLOOD RIGHT HAND  Result Value Ref Range Status   Specimen Description   Final    BLOOD RIGHT HAND Performed at [place] [address] [place], [place] [address]    Special Requests   Final    BOTTLES DRAWN AEROBIC ONLY Blood Culture adequate volume Performed at [place] [address], [place], [place] [address]    Culture   Final    NO GROWTH 4 DAYS Performed at [facility], [place] [address] [place], [place] [address]    Report Status PENDING  Incomplete     RN  Pressure Injury Documentation:     Estimated body mass index is 29.36 kg/m as calculated from the following:   Height as of this encounter: '5\' 9"'$  (1.753 m).   Weight as of this encounter: 90.2 kg.  Malnutrition Type:  Nutrition Problem: Increased nutrient needs Etiology: chronic illness, cancer and cancer related treatments   Malnutrition Characteristics:  Signs/Symptoms: estimated needs   Nutrition Interventions:  Interventions: Premier Protein, MVI   Radiology Studies: No results found.  Scheduled Meds:  sodium chloride   Intravenous Once   aminocaproic acid  10 mL Oral Q6H   aminocaproic acid  1,000 mg Oral Q8H   Chlorhexidine Gluconate Cloth  6 each Topical Daily   famciclovir  500 mg Oral Daily   fluconazole  100 mg Oral Daily   fluticasone  2 spray Each Nare Daily   gabapentin  600 mg Oral QHS   insulin aspart  0-15 Units Subcutaneous TID WC   loratadine  10 mg Oral q morning   metoprolol tartrate  12.5 mg Oral BID   multivitamin with minerals  1 tablet Oral q morning   pantoprazole  40 mg Oral Daily   polyethylene glycol  17 g Oral Daily   Ensure Max Protein  11 oz Oral BID   sodium chloride flush  10-40 mL Intracatheter Q12H   Continuous Infusions:  cefTRIAXone (ROCEPHIN)  IV 2 g (07/13/21 1203)    LOS: 6 days   Kerney Elbe, DO [facility] PAGER is on AMION  If 7PM-7AM, please contact night-coverage [email or web address]

## 2021-07-13 NOTE — Progress Notes (Signed)
Theodore Jenkins is a little more fatigued today.  His hemoglobin is down to 6.8.  He will have to give 1 unit of blood.  Infectious Disease is following quite closely.  There is still now ID on the Strep bacteremia.  He is currently on Rocephin right now.  He has had no fever.  His platelet count is 19,000.  This is somewhat encouraging.  He did get a dose of Procrit yesterday.  He is doing some physical therapy.  He felt more fatigued yesterday.  He says he might go home today.  His white count is 1000.  Hemoglobin 6.8.  Platelet count 19,000.  His sodium is 131.  Potassium 3.7.  BUN is 18 and creatinine 0.92.  Calcium is 8.5 with an albumin of 3.2.  There really is no change in his physical exam.  There is complete healing of the hematoma that was on the lower lip.  He actually does look quite good.  I think the real hold-up is going to be antibiotics for this Strep bacteremia.  Again, Infectious Diseases is following quite closely.  I know that they will always, with a excellent game plan for him for outpatient antibiotics.  He will get 1 unit of blood today.  I know that he has had outstanding care from all staff up on [address].  I appreciate all of their hard work.  Theodore Haw, MD  Theodore Jenkins [phone]

## 2021-07-13 NOTE — Progress Notes (Signed)
PHARMACY CONSULT NOTE FOR:  OUTPATIENT  PARENTERAL ANTIBIOTIC THERAPY (OPAT)  Indication: endocarditis Regimen: ceftriaxone 2 g IV q24h End date: 08/20/2021  IV antibiotic discharge orders are pended. To discharging provider:  please sign these orders via discharge navigator,  Select New Orders & click on the button choice - Manage This Unsigned Work.     Thank you for allowing pharmacy to be a part of this patient's care.  Levonne Spiller 07/13/2021, 12:13 PM

## 2021-07-13 NOTE — Progress Notes (Signed)
PT Cancellation Note  Patient Details Name: Theodore Jenkins MRN: [id number] DOB: Feb 14, 1946   Cancelled Treatment:    Reason Eval/Treat Not Completed: Medical issues which prohibited therapy, getting blood  will check back in AM.   Claretha Cooper 07/13/2021, 4:21 PM. Tresa Endo PT Acute Rehabilitation Services Pager [phone] Office [phone]

## 2021-07-13 NOTE — Consult Note (Signed)
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  He is followed closely by Dr. Marin Olp with oncology. I met with Theodore Jenkins and his wife to discuss goals and to introduce the concept of hospice care.  Today he is feeling significantly better-after large volume transfusion and initiation of IV antibiotics for possible bacteremia. He has a fair functional status and is able to ambulate with minimal assistance and take care of his ADLs although his endurance low and he is very weak most of the time.  Theodore Jenkins and his wife are very informed and engaged with his care. They understand the big picture of where this illness is going and have largely already done planning around this. For now his hope is to be able to maximize his current condition and return home-he desires transfusion as long as his body can tolerate them and based on Dr. Antonieta Pert recommendations.  We discussed his code status. I have placed a DNR order, but he desires full scope appropriate medical treatment for his condition.  At this time he does not have pain or difficult to management symptoms. He has mouth pain and gum bleeding but the amicar mouth wash is helping this.  Recommendations: DNR-updated in [place] Further management per Dr. Marin Olp.  Theodore Hacker, DO Palliative Medicine  Time:50 min Greater than 50%  of this time was spent counseling and coordinating care related to the above assessment and plan.

## 2021-07-14 DIAGNOSIS — D61818 Other pancytopenia: Secondary | ICD-10-CM | POA: Diagnosis not present

## 2021-07-14 DIAGNOSIS — R7881 Bacteremia: Secondary | ICD-10-CM | POA: Diagnosis not present

## 2021-07-14 DIAGNOSIS — I248 Other forms of acute ischemic heart disease: Secondary | ICD-10-CM

## 2021-07-14 DIAGNOSIS — D469 Myelodysplastic syndrome, unspecified: Secondary | ICD-10-CM | POA: Diagnosis not present

## 2021-07-14 DIAGNOSIS — D696 Thrombocytopenia, unspecified: Secondary | ICD-10-CM | POA: Diagnosis not present

## 2021-07-14 DIAGNOSIS — D649 Anemia, unspecified: Secondary | ICD-10-CM | POA: Diagnosis not present

## 2021-07-14 LAB — CBC WITH DIFFERENTIAL/PLATELET
Abs Immature Granulocytes: 0.43 10*3/uL — ABNORMAL HIGH (ref 0.00–0.07)
Basophils Absolute: 0 10*3/uL (ref 0.0–0.1)
Basophils Relative: 0 %
Eosinophils Absolute: 0 10*3/uL (ref 0.0–0.5)
Eosinophils Relative: 0 %
HCT: 24.1 % — ABNORMAL LOW (ref 39.0–52.0)
Hemoglobin: 8.2 g/dL — ABNORMAL LOW (ref 13.0–17.0)
Immature Granulocytes: 29 %
Lymphocytes Relative: 27 %
Lymphs Abs: 0.4 10*3/uL — ABNORMAL LOW (ref 0.7–4.0)
MCH: 30.9 pg (ref 26.0–34.0)
MCHC: 34 g/dL (ref 30.0–36.0)
MCV: 90.9 fL (ref 80.0–100.0)
Monocytes Absolute: 0.2 10*3/uL (ref 0.1–1.0)
Monocytes Relative: 14 %
Neutro Abs: 0.5 10*3/uL — ABNORMAL LOW (ref 1.7–7.7)
Neutrophils Relative %: 30 %
Platelets: 20 10*3/uL — CL (ref 150–400)
RBC: 2.65 MIL/uL — ABNORMAL LOW (ref 4.22–5.81)
RDW: 17.3 % — ABNORMAL HIGH (ref 11.5–15.5)
WBC: 1.5 10*3/uL — ABNORMAL LOW (ref 4.0–10.5)
nRBC: 9.3 % — ABNORMAL HIGH (ref 0.0–0.2)

## 2021-07-14 LAB — PHOSPHORUS: Phosphorus: 3.7 mg/dL (ref 2.5–4.6)

## 2021-07-14 LAB — TYPE AND SCREEN
ABO/RH(D): A POS
Antibody Screen: NEGATIVE
Unit division: 0

## 2021-07-14 LAB — COMPREHENSIVE METABOLIC PANEL
ALT: 38 U/L (ref 0–44)
AST: 31 U/L (ref 15–41)
Albumin: 3.2 g/dL — ABNORMAL LOW (ref 3.5–5.0)
Alkaline Phosphatase: 59 U/L (ref 38–126)
Anion gap: 9 (ref 5–15)
BUN: 16 mg/dL (ref 8–23)
CO2: 23 mmol/L (ref 22–32)
Calcium: 8.7 mg/dL — ABNORMAL LOW (ref 8.9–10.3)
Chloride: 99 mmol/L (ref 98–111)
Creatinine, Ser: 0.88 mg/dL (ref 0.61–1.24)
GFR, Estimated: >60 mL/min (ref >60)
Glucose, Bld: 151 mg/dL — ABNORMAL HIGH (ref 70–99)
Potassium: 3.8 mmol/L (ref 3.5–5.1)
Sodium: 131 mmol/L — ABNORMAL LOW (ref 135–145)
Total Bilirubin: 0.6 mg/dL (ref 0.3–1.2)
Total Protein: 7.2 g/dL (ref 6.5–8.1)

## 2021-07-14 LAB — GLUCOSE, CAPILLARY
Glucose-Capillary: 127 mg/dL — ABNORMAL HIGH (ref 70–99)
Glucose-Capillary: 353 mg/dL — ABNORMAL HIGH (ref 70–99)
Glucose-Capillary: 187 mg/dL — ABNORMAL HIGH (ref 70–99)

## 2021-07-14 LAB — CULTURE, BLOOD (ROUTINE X 2)
Culture: NO GROWTH
Culture: NO GROWTH
Special Requests: ADEQUATE

## 2021-07-14 LAB — BPAM RBC
Blood Product Expiration Date: 202208212359
ISSUE DATE / TIME: 202208020749
Unit Type and Rh: 6200

## 2021-07-14 LAB — MAGNESIUM: Magnesium: 2 mg/dL (ref 1.7–2.4)

## 2021-07-14 MED ORDER — ENSURE MAX PROTEIN PO LIQD: 11.0000 [oz_av] | Freq: Two times a day (BID) | ORAL | Status: DC

## 2021-07-14 MED ORDER — SODIUM CHLORIDE 0.9 % IV SOLN: 2.0000 g | INTRAVENOUS | 38 refills | Status: DC

## 2021-07-14 MED ORDER — POLYETHYLENE GLYCOL 3350 17 G PO PACK: 17.0000 g | PACK | Freq: Every day | ORAL | Status: AC | PRN

## 2021-07-14 MED ORDER — CEFTRIAXONE IV (FOR PTA / DISCHARGE USE ONLY): 2.0000 g | INTRAVENOUS | 0 refills | Status: DC

## 2021-07-14 MED ORDER — HEPARIN SOD (PORK) LOCK FLUSH 100 UNIT/ML IV SOLN
500.0000 [IU] | INTRAVENOUS | Status: AC | PRN
  Administered 2021-07-14: 500 [IU]

## 2021-07-14 MED ORDER — NITROGLYCERIN 0.4 MG SL SUBL: 0.4000 mg | SUBLINGUAL_TABLET | SUBLINGUAL | 6 refills | Status: DC | PRN

## 2021-07-14 NOTE — Progress Notes (Signed)
Physical Therapy Treatment Patient Details Name: BONNIE RICCELLI MRN: [id number] DOB: Aug 16, 1946 Today's Date: 07/14/2021    History of Present Illness Pt is 75 yo male who presented on 07/07/21 with chest pain and found to have demand NSTEMI in setting of anemia and volume overload.  Pt with hx of significant myelodysplastic disorder requring blood transfusions, platelet transfusions, and chemo.  Pt with recent admission (last week) with epistasis and thrombocytopenia. Additional hx of CAD s/p CABG x2, bioprosthetic AVR, CVA, R CEA, AAA, HTN, HLD, and DM2.    PT Comments    Pt demonstrating good progress.  HE has good understanding of HEP and has been ambulating with wife in hallway.  Demonstrated steady balance with RW but does require rest breaks.  VSS during ambulation.  Continue to progress as able, but from PT standpoint safe to d/c home with family when medically ready.     Follow Up Recommendations  No PT follow up     Equipment Recommendations  None recommended by PT    Recommendations for Other Services       Precautions / Restrictions Precautions Precautions: Fall Precaution Comments: Low platelets, WBC    Mobility  Bed Mobility Overal bed mobility: Independent             General bed mobility comments: Up in chair at arrival    Transfers Overall transfer level: Needs assistance Equipment used: Rolling walker (2 wheeled) Transfers: Sit to/from Stand Sit to Stand: Supervision         General transfer comment: Performed x 3  Ambulation/Gait Ambulation/Gait assistance: Supervision Gait Distance (Feet): 150 Feet Assistive device: Rolling walker (2 wheeled) Gait Pattern/deviations: Step-through pattern;Decreased stride length Gait velocity: decreased   General Gait Details: Supervision for safety; took 1 standing rest break; has been ambulating in hallway with wife   Stairs             Wheelchair Mobility    Modified Rankin (Stroke Patients  Only)       Balance Overall balance assessment: Needs assistance Sitting-balance support: No upper extremity supported;Feet supported Sitting balance-Leahy Scale: Good     Standing balance support: Bilateral upper extremity supported;During functional activity;No upper extremity supported Standing balance-Leahy Scale: Fair Standing balance comment: RW to ambulate; could static stand without AD; required min guard with dynamic balance without AD                            Cognition Arousal/Alertness: Awake/alert Behavior During Therapy: WFL for tasks assessed/performed Overall Cognitive Status: Within Functional Limits for tasks assessed                                 General Comments: Pleasant and motivated      Exercises Other Exercises Other Exercises: Sit to stand x 3, heel raises x 5 with min guard, pt verbalizing has HEP at home with T band    General Comments        Pertinent Vitals/Pain Pain Assessment: No/denies pain    Home Living                      Prior Function            PT Goals (current goals can now be found in the care plan section) Acute Rehab PT Goals Patient Stated Goal: return home when able PT Goal Formulation: With  patient/family Time For Goal Achievement: 07/24/21 Potential to Achieve Goals: Good Progress towards PT goals: Progressing toward goals    Frequency    Min 3X/week      PT Plan Current plan remains appropriate    Co-evaluation              AM-PAC PT "6 Clicks" Mobility   Outcome Measure  Help needed turning from your back to your side while in a flat bed without using bedrails?: None Help needed moving from lying on your back to sitting on the side of a flat bed without using bedrails?: None Help needed moving to and from a bed to a chair (including a wheelchair)?: None Help needed standing up from a chair using your arms (e.g., wheelchair or bedside chair)?: None Help  needed to walk in hospital room?: A Little Help needed climbing 3-5 steps with a railing? : A Little 6 Click Score: 22    End of Session Equipment Utilized During Treatment: Gait belt Activity Tolerance: Patient tolerated treatment well Patient left: in chair;with call bell/phone within reach;with family/visitor present Nurse Communication: Mobility status PT Visit Diagnosis: Difficulty in walking, not elsewhere classified (R26.2);Muscle weakness (generalized) (M62.81)     Time: [id number] PT Time Calculation (min) (ACUTE ONLY): 14 min  Charges:  $Gait Training: 8-22 mins                     Abran Richard, PT Acute Rehab Services Pager [phone] [facility] 07/14/2021, 11:16 AM

## 2021-07-14 NOTE — Progress Notes (Signed)
Patient discharged home with wife.  IV removed - WNL.  Port re-accessed and left intact by IV team to receive IV abx at home.  Reviewed AVS and medications, instructed to follow up with MD.  Pt and wife verbalize understanding.  No questions at this time. Patient assisted off unit via [place] by staff in NAD.

## 2021-07-14 NOTE — Progress Notes (Signed)
Hopefully, [name] will go home today.  His lab work looks pretty good.  He was transfused I think yesterday.  His white cell count is 1.5.  Hemoglobin 8.2.  Platelet count 20,000.  Not had to give him platelets for quite a while.  This is helpful and hopefully is a indicator that his bone marrow is working a bit better.  I do think that treating this bacteremia is going to be quite helpful also.  I am not sure as to why it is taking so long to get the identification of this Streptococcus.  His follow-up blood cultures on 07/09/2021 to not show any growth.  His Port-A-Cath is working.  His Port-A-Cath is fine to keep in.  He has good appetite.  He has had no nausea or vomiting.  There is no bleeding.  He is doing some physical therapy.  Again, hopefully he will be able to go home today.  His vital signs are temperature 97.9.  Pulse 89.  Blood pressure 118/73.  His head and neck exam shows no mucositis.  There is no oral hematomas or petechia.  The hematoma on his lower lip has completely resolved.  His lungs are clear.  Cardiac exam regular rate and rhythm.  Abdomen is soft.  He has good bowel sounds.  There is no fluid wave.  Extremities shows no clubbing, cyanosis or edema.  Neurological exam shows no focal neurological deficits.  Again, hopefully [name] will go home.  I do think that we do have the opportunity to treat.  I would treat his myelodysplasia with decitabine.  I think this would be a reasonable option for him.  I think he would be able to tolerated.  I think the 1 issue would be to try to coordinate his treatment with respect to any antibiotics as getting for this bacteremia.  We will plan to get him back to the office next Monday for lab work.  I would then hopefully plan to get treatment started following Monday.  I appreciate the outstanding care he is got from all the staff up on 4 W.  I know they worked incredibly hard to get him better.   Lattie Haw, MD  Psalm  23:4

## 2021-07-14 NOTE — Progress Notes (Signed)
[place] for Infectious Disease  Date of Admission:  07/07/2021           Reason for visit: Follow up on bacteremia  Current antibiotics: Ceftriaxone   ASSESSMENT:    Strep bacteremia: Final organism ID and susceptibilities are still pending.  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  Colonoscopy at that time was unremarkable since this was part of the strep bovis family.  His TTE this admission was without obvious vegetations.  Unfortunately, his blood counts currently prohibit obtaining a TEE to further evaluate for PVE.  Based on this, plan is to presumptively treat for involvement of his valve.  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     PLAN:    Continue ceftriaxone 2 g daily Will follow identification and susceptibilities as an outpatient See OP AT note below.  We will sign off for now please call with questions.   Diagnosis: PVE  Culture Result: Strep spp  Allergies  Allergen Reactions   Oxycodone Nausea And Vomiting    OPAT Orders Discharge antibiotics to be given via PICC line Discharge antibiotics: Per pharmacy protocol Ceftriaxone 2 gm daily  Duration: 6 weeks End Date: 08/20/21  [facility] Care Per Protocol:  Home health RN for IV administration and teaching; PICC line care and labs.    Labs weekly while on IV antibiotics: _x_ CBC with differential __ BMP _x_ CMP __ CRP __ ESR __ Vancomycin trough __ CK  LABS CAN BE OBTAINED WEEKLY AT HIS ONCOLOGY APPOINTMENTS IF ABLE TO DO SO  __ Please pull PIC at completion of IV antibiotics _x_ Please leave PIC in place until doctor has seen patient or been notified (Patient will receive abx through PORT which will be subsequently maintained by oncology)  Fax weekly labs to [phone]  Clinic Follow Up Appt: 09/02/2021 @ 1115am with Dr  Juleen China    Principal Problem:   Chest pain Active Problems:   Essential hypertension   S/P aortic valve replacement with bioprosthetic valve    S/P CABG x 2   Hyponatremia   Pancytopenia (HCC)   MDS (myelodysplastic syndrome), high grade (HCC)   OSA on CPAP   Thrombocytopenia (HCC)   Acute blood loss anemia (ABLA)   Bacteremia due to Gram-positive bacteria   MDS (myelodysplastic syndrome) (HCC)   Non-ST elevation (NSTEMI) myocardial infarction ([place])    MEDICATIONS:    Scheduled Meds:  sodium chloride   Intravenous Once   aminocaproic acid  10 mL Oral Q6H   aminocaproic acid  1,000 mg Oral Q8H   Chlorhexidine Gluconate Cloth  6 each Topical Daily   famciclovir  500 mg Oral Daily   fluconazole  100 mg Oral Daily   fluticasone  2 spray Each Nare Daily   gabapentin  600 mg Oral QHS   insulin aspart  0-15 Units Subcutaneous TID WC   loratadine  10 mg Oral q morning   metoprolol tartrate  12.5 mg Oral BID   multivitamin with minerals  1 tablet Oral q morning   pantoprazole  40 mg Oral Daily   polyethylene glycol  17 g Oral Daily   Ensure Max Protein  11 oz Oral BID   sodium chloride flush  10-40 mL Intracatheter Q12H   Continuous Infusions:  cefTRIAXone (ROCEPHIN)  IV 2 g (07/13/21 1203)   PRN Meds:.acetaminophen, LORazepam,  nitroGLYCERIN, ondansetron **OR** ondansetron (ZOFRAN) IV, sodium chloride flush  SUBJECTIVE:   24 hour events:  No acute events noted overnight Labs today show stable/improved blood counts CMP unremarkable Repeat blood cultures 07/09/2021 finalized no growth 07/07/2021 cultures still pending identification and have been read incubated for better growth.  Prelim read as possible strep salivarius OPAT orders pending with ceftriaxone x6 weeks through 08/20/2021 via PORT  Patient seen this morning with his wife at the bedside.  He is hoping to go home today.  He denies any fevers or chills.  No chest pain or shortness of breath.  He feels comfortable  with antibiotic plan.  Review of Systems  All other systems reviewed and are negative.    OBJECTIVE:   Blood pressure 118/73, pulse 89, temperature 97.9 F (36.6 C), temperature source Oral, resp. rate 18, height 5' 9"  (1.753 m), weight 90.2 kg, SpO2 97 %. Body mass index is 29.36 kg/m.  Physical Exam Constitutional:      General: He is not in acute distress.    Appearance: Normal appearance.  HENT:     Head: Normocephalic and atraumatic.  Cardiovascular:     Comments: Right sided chest port in place.  Pulmonary:     Effort: Pulmonary effort is normal. No respiratory distress.  Neurological:     General: No focal deficit present.     Mental Status: He is alert and oriented to person, place, and time.  Psychiatric:        Mood and Affect: Mood normal.        Behavior: Behavior normal.     Lab Results: Lab Results  Component Value Date   WBC 1.5 (L) 07/14/2021   HGB 8.2 (L) 07/14/2021   HCT 24.1 (L) 07/14/2021   MCV 90.9 07/14/2021   PLT 20 (LL) 07/14/2021    Lab Results  Component Value Date   NA 131 (L) 07/14/2021   K 3.8 07/14/2021   CO2 23 07/14/2021   GLUCOSE 151 (H) 07/14/2021   BUN 16 07/14/2021   CREATININE 0.88 07/14/2021   CALCIUM 8.7 (L) 07/14/2021   GFRNONAA >60 07/14/2021   GFRAA 56 (L) 12/09/2020    Lab Results  Component Value Date   ALT 38 07/14/2021   AST 31 07/14/2021   ALKPHOS 59 07/14/2021   BILITOT 0.6 07/14/2021    No results found for: CRP  No results found for: ESRSEDRATE   I have reviewed the micro and lab results in Epic.  Imaging: No results found.   Imaging independently reviewed in Epic.    Raynelle Highland for Infectious Disease [facility] [phone] pager 07/14/2021, 9:07 AM  I spent greater than 35 minutes with the patient including greater than 50% of time in face to face counsel of the patient and in coordination of their care.

## 2021-07-15 ENCOUNTER — Other Ambulatory Visit (HOSPITAL_COMMUNITY): Payer: Self-pay

## 2021-07-15 ENCOUNTER — Telehealth: Payer: Self-pay | Admitting: *Deleted

## 2021-07-15 ENCOUNTER — Encounter: Payer: Self-pay | Admitting: Hematology & Oncology

## 2021-07-15 ENCOUNTER — Ambulatory Visit: Payer: PPO

## 2021-07-15 ENCOUNTER — Other Ambulatory Visit: Payer: Self-pay | Admitting: Hematology & Oncology

## 2021-07-15 DIAGNOSIS — D46Z Other myelodysplastic syndromes: Secondary | ICD-10-CM

## 2021-07-15 DIAGNOSIS — R7881 Bacteremia: Secondary | ICD-10-CM | POA: Diagnosis not present

## 2021-07-15 NOTE — Telephone Encounter (Signed)
Per [facility] staff message - called and spoke to patient's wife and gave upcoming appointments - confirmed

## 2021-07-15 NOTE — Discharge Summary (Signed)
Discharge Summary  Theodore Jenkins IRC:[id number] DOB: [birth year]  PCP: Lezlie Octave, PA-C  Admit date: 07/07/2021 Discharge date: 07/14/2021  Time spent: 25 minutes  Recommendations for Outpatient Follow-up:  IV Rocephin 2 g daily until 9/9. Home health will follow for IV line care. Patient will follow-up with infectious disease as outpatient Patient previously been on Cipro.  This to be discontinued  Discharge Diagnoses:  Active Hospital Problems   Diagnosis Date Noted   Chest pain 07/07/2021   MDS (myelodysplastic syndrome) (HCC)    Non-ST elevation (NSTEMI) myocardial infarction ([place])    Bacteremia due to Gram-positive bacteria    Acute blood loss anemia (ABLA) 07/07/2021   Thrombocytopenia ([place]) 06/28/2021   MDS (myelodysplastic syndrome), high grade ([place]) 12/22/2020   Pancytopenia ([place]) 12/10/2020   Hyponatremia 04/30/2020   S/P aortic valve replacement with bioprosthetic valve  10/26/2017   S/P CABG x 2 10/26/2017   OSA on CPAP 01/06/2017   Essential hypertension 06/06/2011    Resolved Hospital Problems  No resolved problems to display.    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  Return back to the emergency room on 7/27 with complaints of chest heaviness as well as hematuria and bleeding from his urethra.  On  admission, found to have a hemoglobin of 4.3 (had been 7.42 days prior) platelet count of 6000 and BNP of 825.  Patient admitted to the hospital service with consultation to oncology.  Patient transfused 3 units packed red blood cells and 2 packed platelets  Hospital Course:  Principal Problem:   Chest pain with history of aortic valve replacement with bioprosthetic valve and CABG: Cardiology consulted and chest pain felt to be secondary to demand ischemia from low hemoglobin.  Patient given dose of IV Lasix, from likely high-output heart failure from low hemoglobin.  2D echo noting severe hypokinesis of the septum.  Cardiology recommended goal-directed medical therapy with Crestor metoprolol but because the patient had recent AKI, off his ACE inhibitor.  Not a candidate for invasive studies Active Problems:   Essential hypertension: Pressure stable   S/P aortic valve replacement with bioprosthetic valve    S/P CABG x 2   Hyponatremia   Pancytopenia (HCC)   MDS (myelodysplastic syndrome), high grade ([place]) with pancytopenia: Seen by oncology.  Felt to have very little response to chemotherapy and recommendation to give patient Amicar mouth rinse and to discuss hospice care.  Oncology tried Nplate and Aranesp given 7/29.  Palliative care consulted and plan made as patient confirmed to be DNR with home-based palliative care consult and hoping to maximize current condition.  He desires transfusion as long as his body can tolerate.    OSA on CPAP: Continue on CPAP    Bacteremia due to Gram-positive bacteria: During patient's work-up, he had blood cultures that grew out gram-positive cocci with Streptococcus species.  Patient has a previous history  of Streptococcus bacteremia and endocarditis.  Echo done but not a good candidate for TEE given pancytopenia.  Had Port-A-Cath placed in has been there since January.  I did recommend that if he wanted aggressive care 6 weeks of IV antibiotics and removal of  Port-A-Cath.  Patient oncology did not want Port-A-Cath removal.  Plan is for sending patient home with IV Rocephin 2 g daily and they will follow-up as outpatient for identification and susceptibilities.   Procedures: Echocardiogram noting severe hypokinesis of the septum, ejection fraction 50-55% Transfusion of 3 units packed red blood cells and 2 packs of platelets on admission, followed by repeat transfusion of another unit of packed red blood cells.  Consultations: Infectious disease Oncology Cardiology Palliative care Gastroenterology  Discharge Exam: BP 118/73 (BP Location: Right Arm)   Pulse 89   Temp 97.9 F (36.6 C) (Oral)   Resp 18   Ht 5' 9"  (1.753 m)   Wt 90.2 kg   SpO2 97%   BMI 29.36 kg/m   General: Alert and oriented x3, no acute distress Cardiovascular: Regular rate and rhythm, S1-S2 Respiratory: Clear to auscultation bilaterally  Discharge Instructions You were cared for by a hospitalist during your hospital stay. If you have any questions about your discharge medications or the care you received while you were in the hospital after you are discharged, you can call the unit and asked to speak with the hospitalist on call if the hospitalist that took care of you is not available. Once you are discharged, your primary care physician will handle any further medical issues. Please note that NO REFILLS for any discharge medications will be authorized once you are discharged, as it is imperative that you return to your primary care physician (or establish a relationship with a primary care physician if you do not have one) for your aftercare needs so that they can reassess your need for medications and monitor your lab values.  Discharge Instructions     [facility] pharmacist to adjust dose for Vancomycin, Aminoglycosides and other anti-infective therapies as requested by physician.   Complete by: As directed    [facility] to provide Cath Flo  65m   Complete by: As directed    Administer for PICC line occlusion and as ordered by physician for other access device issues.   Anaphylaxis Kit: Provided to treat any anaphylactic reaction to the medication being provided to the patient if First Dose or when requested by physician   Complete by: As directed    Epinephrine 120mml vial / amp: Administer 0.57m75m0.57ml52mubcutaneously once for moderate to severe anaphylaxis, nurse to call physician and pharmacy when reaction occurs and call 911 if needed for immediate care   Diphenhydramine 50mg40mIV vial: Administer 25-50mg 157mM PRN for first dose reaction, rash, itching, mild reaction, nurse to call physician and pharmacy when reaction occurs   Sodium Chloride 0.9% NS 500ml I4mdminister if needed for hypovolemic blood pressure drop or as ordered by physician after call to physician with anaphylactic reaction   Change dressing on IV access line weekly and PRN   Complete by: As directed    Diet - low sodium heart healthy   Complete by: As directed    Flush IV access with Sodium Chloride 0.9% and Heparin 10 units/ml or 100 units/ml   Complete by: As directed    Home infusion instructions - [facility]   Complete by: As directed    Instructions: Flush IV access with  Sodium Chloride 0.9% and Heparin 10units/ml or 100units/ml   Change dressing on IV access line: Weekly and PRN   Instructions Cath Flo 26m: Administer for PICC Line occlusion and as ordered by physician for other access device   [facility] pharmacist to adjust dose for: Vancomycin, Aminoglycosides and other anti-infective therapies as requested by physician   Increase activity slowly   Complete by: As directed    Method of administration may be changed at the discretion of home infusion pharmacist based upon assessment of the patient and/or caregiver's ability to self-administer the medication ordered   Complete by: As directed    Outpatient Parenteral  Antibiotic Therapy Information Antibiotic: Ceftriaxone (Rocephin) IVPB; Indications for use: endocarditis; End Date: 08/20/2021   Complete by: As directed    Antibiotic: Ceftriaxone (Rocephin) IVPB   Indications for use: endocarditis   End Date: 08/20/2021      Allergies as of 07/14/2021       Reactions   Oxycodone Nausea And Vomiting        Medication List     STOP taking these medications    ciprofloxacin 500 MG tablet Commonly known as: Cipro       TAKE these medications    acetaminophen 500 MG tablet Commonly known as: TYLENOL Take 1,000 mg by mouth every 8 (eight) hours as needed for mild pain (or headaches).   aminocaproic acid 500 MG tablet Commonly known as: AMICAR Take 2 tablets (1,000 mg total) by mouth every 8 (eight) hours.   amoxicillin 500 MG capsule Commonly known as: AMOXIL Take 2,000 mg by mouth See admin instructions. Take 2,000 mg by mouth one hour prior to dental appointments   cefTRIAXone  IVPB Commonly known as: ROCEPHIN Inject 2 g into the vein daily. Indication:  endocarditis First Dose: No Last Day of Therapy:  08/20/2021 Labs - Once weekly:  CBC/D and BMP, Labs - Every other week:  ESR and CRP Method of administration: IV Push Method of administration may be changed at the discretion of home infusion pharmacist based upon assessment of the patient and/or caregiver's ability to self-administer the medication ordered.   cefTRIAXone 2 g in sodium chloride 0.9 % 100 mL Inject 2 g into the vein daily.   chlorhexidine 0.12 % solution Commonly known as: Peridex Use as directed 15 mLs in the mouth or throat 2 (two) times daily. What changed: when to take this   Ensure Max Protein Liqd Take 330 mLs (11 oz total) by mouth 2 (two) times daily.   famciclovir 500 MG tablet Commonly known as: FAMVIR Take 1 tablet (500 mg total) by mouth daily.   fluconazole 100 MG tablet Commonly known as: DIFLUCAN Take 1 tablet (100 mg total) by mouth daily.    fluticasone 50 MCG/ACT nasal spray Commonly known as: FLONASE Place 2 sprays into both nostrils daily as needed for allergies.   gabapentin 300 MG capsule Commonly known as: NEURONTIN Take 600 mg by mouth at bedtime.   lidocaine-prilocaine cream Commonly known as: EMLA Apply to affected area once What changed:  how much to take how to take this when to take this reasons to take this   loratadine 10 MG tablet Commonly known as: CLARITIN Take 10 mg by mouth every morning.   LORazepam 0.5 MG tablet Commonly known as: Ativan Take 1 tablet (0.5 mg total) by mouth every 6 (six) hours as needed (Nausea or vomiting).   metoprolol succinate 50 MG 24 hr tablet Commonly known as: TOPROL-XL Take  50 mg by mouth at bedtime.   multivitamin with minerals Tabs tablet Take 1 tablet by mouth every morning.   mupirocin ointment 2 % Commonly known as: BACTROBAN Place into the nose 2 (two) times daily.   nitroGLYCERIN 0.4 MG SL tablet Commonly known as: NITROSTAT Place 1 tablet (0.4 mg total) under the tongue every 5 (five) minutes as needed for chest pain.   omeprazole 20 MG capsule Commonly known as: PRILOSEC Take 20 mg by mouth daily at 6 PM.   ondansetron 8 MG tablet Commonly known as: Zofran Take 1 tablet (8 mg total) by mouth 2 (two) times daily as needed for refractory nausea / vomiting. Start on the third day after last day of chemotherapy.   OneTouch Verio test strip Generic drug: glucose blood 1 each by Other route daily.   oxymetazoline 0.05 % nasal spray Commonly known as: AFRIN Place 1 spray into both nostrils 2 (two) times daily.   polyethylene glycol 17 g packet Commonly known as: MIRALAX / GLYCOLAX Take 17 g by mouth daily as needed for moderate constipation.   prochlorperazine 10 MG tablet Commonly known as: COMPAZINE Take 1 tablet (10 mg total) by mouth every 6 (six) hours as needed (Nausea or vomiting).   rosuvastatin 10 MG tablet Commonly known as:  CRESTOR Take 10 mg by mouth at bedtime.   senna 8.6 MG Tabs tablet Commonly known as: SENOKOT Take 1 tablet (8.6 mg total) by mouth 2 (two) times daily.   sitaGLIPtin-metformin 50-1000 MG tablet Commonly known as: JANUMET Take 1 tablet by mouth 2 (two) times daily with a meal.   sodium chloride 0.65 % Soln nasal spray Commonly known as: OCEAN Place 2 sprays into both nostrils every 2 (two) hours.               Discharge Care Instructions  (From admission, onward)           Start     Ordered   07/14/21 0000  Change dressing on IV access line weekly and PRN  (Home infusion instructions - [facility] )        07/14/21 1332           Allergies  Allergen Reactions   Oxycodone Nausea And Vomiting    Follow-up Information     [facility].   Specialty: [place] Why: will follow you at home with RN Contact information: [address] [place] [id number] [phone]                  The results of significant diagnostics from this hospitalization (including imaging, microbiology, ancillary and laboratory) are listed below for reference.    Significant Diagnostic Studies: DG Chest 1 View  Result Date: 07/01/2021 CLINICAL DATA:  Shortness of breath. History of myelodysplastic syndrome. EXAM: CHEST  1 VIEW COMPARISON:  Most recent radiograph 12/10/2020, CT 12/11/2020 FINDINGS: Right chest port in place. Post median sternotomy with aortic valve replacement. Similar cardiomegaly. Patchy airspace opacity in the right infrahilar lung. Mild interstitial in septal thickening suggestive of pulmonary edema. No pleural fluid or pneumothorax. IMPRESSION: 1. Cardiomegaly with mild interstitial thickening, suspicious for pulmonary edema. 2. Patchy airspace opacity in the right infrahilar lung, may be pneumonia or asymmetric alveolar edema. Electronically Signed   By: Keith Rake M.D.   On: 07/01/2021 19:47    DG Chest 2 View  Result Date: 07/07/2021 CLINICAL DATA:  75 year old male with chest pressure and fatigue  for 1 week. Pancytopenia. EXAM: CHEST - 2 VIEW COMPARISON:  Chest radiographs 07/01/2021 and earlier. FINDINGS: PA and lateral views today. Stable right chest power port. Prior sternotomy and cardiac valve replacement. Mediastinal contours are stable and within normal limits. Regressed pulmonary interstitial opacity from earlier this month. The left lung now appears clear. No pneumothorax or layering pleural fluid. However, there is indistinct right middle lobe opacity on both views, which persists from earlier this month. No acute osseous abnormality identified. Negative visible bowel gas pattern. IMPRESSION: 1. Confluent but indistinct right middle lobe opacity not improved from 6 days ago. This is nonspecific, but consider right middle lobe pneumonia. 2. Otherwise pulmonary interstitial edema appears resolved from earlier this month. No new cardiopulmonary abnormality. Electronically Signed   By: Genevie Ann M.D.   On: 07/07/2021 10:27   CT CHEST WO CONTRAST  Result Date: 07/07/2021 CLINICAL DATA:  Pneumonia EXAM: CT CHEST WITHOUT CONTRAST TECHNIQUE: Multidetector CT imaging of the chest was performed following the standard protocol without IV contrast. COMPARISON:  CT chest abdomen pelvis, 12/11/2020 FINDINGS: Cardiovascular: Right chest port catheter. Aortic atherosclerosis. Aortic valve prosthesis. Cardiomegaly. Extensive 3 vessel coronary artery calcifications and/or stents. No pericardial effusion. Mediastinum/Nodes: Newly enlarged right hilar, subcarinal, pretracheal, and high right paratracheal lymph nodes, largest right paratracheal node measuring 1.4 x 1.2 cm (series 2, image 27). Thyroid gland, trachea, and esophagus demonstrate no significant findings. Lungs/Pleura: Small bilateral pleural effusions and associated atelectasis or consolidation. Interlobular septal thickening throughout the  lungs. There is heterogeneous airspace opacity and consolidation of the lateral segment right middle lobe (series 4, image 75). There are numerous new bilateral pulmonary nodules of varying sizes, for example a 1.7 x 1.5 cm nodule of the left apex (series 4, image 20), a 0.7 cm nodule of the right upper lobe (series 4, image 44), and a 1.4 x 1.3 cm nodule of the peripheral left upper lobe (series 4, image 43). Upper Abdomen: No acute abnormality. Musculoskeletal: No chest wall mass or suspicious bone lesions identified. IMPRESSION: 1. There is heterogeneous airspace opacity and consolidation of the lateral segment right middle lobe , consistent with infection or aspiration. 2. There are numerous new bilateral pulmonary nodules of varying sizes, which are generally nonspecific, possibly infectious or inflammatory in nature although concerning for pulmonary metastatic disease. 3. Newly enlarged mediastinal lymph nodes, which may be reactive to infection, although as above, malignancy is difficult to exclude. 4. Small bilateral pleural effusions and associated atelectasis or consolidation. 5. Interlobular septal thickening throughout the lungs, consistent with pulmonary edema. 6. Cardiomegaly and coronary artery disease. Aortic Atherosclerosis (ICD10-I70.0). Electronically Signed   By: Eddie Candle M.D.   On: 07/07/2021 15:50   CT BIOPSY  Result Date: 07/01/2021 INDICATION: 75 year old male with pancytopenia. EXAM: CT GUIDED BONE MARROW ASPIRATION AND CORE BIOPSY MEDICATIONS: None. ANESTHESIA/SEDATION: Moderate (conscious) sedation was employed during this procedure. A total of to milligrams versed and 100 micrograms fentanyl were administered intravenously. The patient's level of consciousness and vital signs were monitored continuously by radiology nursing throughout the procedure under my direct supervision. Total monitored sedation time: 20 minutes FLUOROSCOPY TIME:  CT dose (total DLP): 196 mGy cm.  COMPLICATIONS: None immediate. Estimated blood loss: <5 mL PROCEDURE: Informed written consent was obtained from the patient after a thorough discussion of the procedural risks, benefits and alternatives. All questions were addressed. Maximal Sterile Barrier Technique was utilized including caps, mask, sterile gowns, sterile gloves, sterile drape, hand hygiene and skin antiseptic. A timeout was performed  prior to the initiation of the procedure. The patient was positioned prone and non-contrast localization CT was performed of the pelvis to demonstrate the iliac marrow spaces. Maximal barrier sterile technique utilized including caps, mask, sterile gowns, sterile gloves, large sterile drape, hand hygiene, and chlorhexidine prep. Under sterile conditions and local anesthesia, an 11 gauge coaxial bone biopsy needle was advanced into the right iliac marrow space. Needle position was confirmed with CT imaging. Initially, bone marrow aspiration was performed. Next, the 11 gauge outer cannula was utilized to obtain a right iliac bone marrow core biopsy. Needle was removed. Hemostasis was obtained with compression. The patient tolerated the procedure well. Samples were prepared with the cytotechnologist. IMPRESSION: Successful CT-guided bone marrow aspiration and biopsy. Electronically Signed   By: Michaelle Birks MD   On: 07/01/2021 14:16   CT BONE MARROW BIOPSY & ASPIRATION  Result Date: 07/02/2021 : CT-GUIDED BONE MARROW ASPIRATION AND CORE BIOPSY Duplicate imaging order.  See same day dictation. Electronically Signed   By: Michaelle Birks MD   On: 07/02/2021 17:23   ECHOCARDIOGRAM COMPLETE  Result Date: 07/08/2021    ECHOCARDIOGRAM REPORT   Patient Name:   Theodore Jenkins [facility] Date of Exam: 07/08/2021 Medical Rec #:  [id number]   Height:       69.0 in Accession #:    [id number]  Weight:       198.8 lb Date of Birth:  [birth year]  BSA:          2.061 m Patient Age:    31 years    BP:           137/78 mmHg Patient Gender: M            HR:           88 bpm. Exam Location:  Inpatient Procedure: 2D Echo, Cardiac Doppler and Color Doppler Indications:    Chest pain  History:        Patient has prior history of Echocardiogram examinations, most                 recent 12/11/2020. CAD, Prior CABG, Stroke and Carotid Disease,                 Signs/Symptoms:Chest Pain and Hematuria; Risk                 Factors:Hypertension, Diabetes, Dyslipidemia and Obesity.  Sonographer:    Dustin Flock Referring Phys: [id number] TOCHUKWU AGBATA IMPRESSIONS  1. There is severe hypokinesis of the septum that is out of proportion to postoperative septal movement. This appears new compared with prior. The findings possibly suggest progression of disease proximal to LIMA-LAD insertion. Left ventricular ejection  fraction, by estimation, is 50 to 55%. The left ventricle has low normal function. The left ventricle demonstrates regional wall motion abnormalities (see scoring diagram/findings for description). There is mild concentric left ventricular hypertrophy. Indeterminate diastolic filling due to E-A fusion.  2. Right ventricular systolic function is normal. The right ventricular size is normal. There is normal pulmonary artery systolic pressure. The estimated right ventricular systolic pressure is 73.4 mmHg.  3. The mitral valve is grossly normal. Mild mitral valve regurgitation. No evidence of mitral stenosis.  4. 25 mm Magna Ease Pericardial bioprosthesis in the aortic position. Vmax 2.2 m/s, MG 12 mmHG, EOA 1.92 cm2, DI 0.56. The leaflets appear thickened but there is no increase in gradients or parameters to suggest stenosis. The aortic valve has been repaired/replaced. Aortic valve regurgitation is not  visualized. Procedure Date: 10/26/2017.  5. The inferior vena cava is normal in size with greater than 50% respiratory variability, suggesting right atrial pressure of 3 mmHg. Comparison(s): Changes from prior study are noted. FINDINGS  Left Ventricle: There is  severe hypokinesis of the septum that is out of proportion to postoperative septal movement. This appears new compared with prior. The findings possibly suggest progression of disease proximal to LIMA-LAD insertion. Left ventricular ejection fraction, by estimation, is 50 to 55%. The left ventricle has low normal function. The left ventricle demonstrates regional wall motion abnormalities. The left ventricular internal cavity size was normal in size. There is mild concentric left ventricular hypertrophy. Abnormal (paradoxical) septal motion consistent with post-operative status. Indeterminate diastolic filling due to E-A fusion.  LV Wall Scoring: The mid and distal anterior septum and inferior septum are hypokinetic. Right Ventricle: The right ventricular size is normal. No increase in right ventricular wall thickness. Right ventricular systolic function is normal. There is normal pulmonary artery systolic pressure. The tricuspid regurgitant velocity is 2.86 m/s, and  with an assumed right atrial pressure of 3 mmHg, the estimated right ventricular systolic pressure is 44.8 mmHg. Left Atrium: Left atrial size was normal in size. Right Atrium: Right atrial size was normal in size. Prominent Chiari network. Pericardium: There is no evidence of pericardial effusion. Mitral Valve: The mitral valve is grossly normal. Mild mitral valve regurgitation. No evidence of mitral valve stenosis. Tricuspid Valve: The tricuspid valve is grossly normal. Tricuspid valve regurgitation is trivial. No evidence of tricuspid stenosis. Aortic Valve: 25 mm Magna Ease Pericardial bioprosthesis in the aortic position. Vmax 2.2 m/s, MG 12 mmHG, EOA 1.92 cm2, DI 0.56. The leaflets appear thickened but there is no increase in gradients or parameters to suggest stenosis. The aortic valve has been repaired/replaced. Aortic valve regurgitation is not visualized. Aortic valve mean gradient measures 13.0 mmHg. Aortic valve peak gradient measures 19.4  mmHg. Aortic valve area, by VTI measures 1.92 cm. There is a 25 mm [employer] Ease bovine valve present in the aortic position. Pulmonic Valve: The pulmonic valve was grossly normal. Pulmonic valve regurgitation is trivial. No evidence of pulmonic stenosis. Aorta: The aortic root and ascending aorta are structurally normal, with no evidence of dilitation. Venous: The inferior vena cava is normal in size with greater than 50% respiratory variability, suggesting right atrial pressure of 3 mmHg. IAS/Shunts: The atrial septum is grossly normal.  LEFT VENTRICLE PLAX 2D LVIDd:         4.90 cm      Diastology LVIDs:         3.50 cm      LV e' medial:    6.53 cm/s LV PW:         1.30 cm      LV E/e' medial:  16.4 LV IVS:        1.20 cm      LV e' lateral:   10.40 cm/s LVOT diam:     2.10 cm      LV E/e' lateral: 10.3 LV SV:         76 LV SV Index:   37 LVOT Area:     3.46 cm  LV Volumes (MOD) LV vol d, MOD A4C: 120.0 ml LV vol s, MOD A4C: 50.2 ml LV SV MOD A4C:     120.0 ml RIGHT VENTRICLE RV Basal diam:  3.20 cm RV S prime:     9.03 cm/s TAPSE (M-mode): 2.0 cm LEFT ATRIUM  Index       RIGHT ATRIUM           Index LA diam:        4.60 cm 2.23 cm/m  RA Area:     15.30 cm LA Vol (A2C):   78.4 ml 38.04 ml/m RA Volume:   36.30 ml  17.62 ml/m LA Vol (A4C):   53.1 ml 25.77 ml/m LA Biplane Vol: 65.3 ml 31.69 ml/m  AORTIC VALVE AV Area (Vmax):    1.79 cm AV Area (Vmean):   1.54 cm AV Area (VTI):     1.92 cm AV Vmax:           220.33 cm/s AV Vmean:          166.000 cm/s AV VTI:            0.396 m AV Peak Grad:      19.4 mmHg AV Mean Grad:      13.0 mmHg LVOT Vmax:         114.00 cm/s LVOT Vmean:        73.700 cm/s LVOT VTI:          0.220 m LVOT/AV VTI ratio: 0.56  AORTA Ao Root diam: 2.90 cm MITRAL VALVE                TRICUSPID VALVE MV Area (PHT): 3.93 cm     TR Peak grad:   32.7 mmHg MV Decel Time: 193 msec     TR Vmax:        286.00 cm/s MV E velocity: 107.00 cm/s MV A velocity: 32.50 cm/s   SHUNTS MV  E/A ratio:  3.29         Systemic VTI:  0.22 m                             Systemic Diam: 2.10 cm Eleonore Chiquito MD Electronically signed by Eleonore Chiquito MD Signature Date/Time: 07/08/2021/2:25:30 PM    Final     Microbiology: Recent Results (from the past 240 hour(s))  Blood culture (routine x 2)     Status: None (Preliminary result)   Collection Time: 07/07/21 12:40 PM   Specimen: BLOOD  Result Value Ref Range Status   Specimen Description   Final    BLOOD RIGHT ANTECUBITAL Performed at [facility], [place] [address] [place], [place] [address]    Special Requests   Final    BOTTLES DRAWN AEROBIC AND ANAEROBIC Blood Culture adequate volume Performed at [place] [address] [place], [place] [address]    Culture  Setup Time   Final    GRAM POSITIVE COCCI IN CHAINS IN BOTH AEROBIC AND ANAEROBIC BOTTLES CRITICAL VALUE NOTED.  VALUE IS CONSISTENT WITH PREVIOUSLY REPORTED AND CALLED VALUE.    Culture   Final    GRAM POSITIVE COCCI SEND TO [facility] FOR ID AND SENSITIVITY Performed at [facility], [place] [address], [place], [place] [address]    Report Status PENDING  Incomplete  Blood culture (routine x 2)     Status: None (Preliminary result)   Collection Time: 07/07/21  1:20 PM   Specimen: BLOOD  Result Value Ref Range Status   Specimen Description   Final    BLOOD LEFT ANTECUBITAL Performed at [place] [address], [place], [place] [address]    Special Requests   Final    BOTTLES DRAWN AEROBIC  AND ANAEROBIC Blood Culture results may not be optimal due to an excessive volume of blood received in culture bottles Performed at [place] [address] [place], [place] [address]    Culture  Setup Time   Final    GRAM POSITIVE COCCI IN CHAINS IN BOTH AEROBIC AND ANAEROBIC BOTTLES CRITICAL RESULT CALLED TO, READ BACK BY AND VERIFIED WITH: PHARMD MARY TUCKER 07/08/21@10 :56 BY TW     Culture   Final    GRAM POSITIVE COCCI SEND TO [facility] FOR ID AND SENSITIVITY Performed at [facility], [place] [address] [place], [place] [address]    Report Status PENDING  Incomplete  Blood Culture ID Panel (Reflexed)     Status: Abnormal   Collection Time: 07/07/21  1:20 PM  Result Value Ref Range Status   Enterococcus faecalis NOT DETECTED NOT DETECTED Final   Enterococcus Faecium NOT DETECTED NOT DETECTED Final   Listeria monocytogenes NOT DETECTED NOT DETECTED Final   Staphylococcus species NOT DETECTED NOT DETECTED Final   Staphylococcus aureus (BCID) NOT DETECTED NOT DETECTED Final   Staphylococcus epidermidis NOT DETECTED NOT DETECTED Final   Staphylococcus lugdunensis NOT DETECTED NOT DETECTED Final   Streptococcus species DETECTED (A) NOT DETECTED Final    Comment: Not Enterococcus species, Streptococcus agalactiae, Streptococcus pyogenes, or Streptococcus pneumoniae. CRITICAL RESULT CALLED TO, READ BACK BY AND VERIFIED WITH: PHARMD MARY TUCKER 07/08/21@10 :55 BY TW    Streptococcus agalactiae NOT DETECTED NOT DETECTED Final   Streptococcus pneumoniae NOT DETECTED NOT DETECTED Final   Streptococcus pyogenes NOT DETECTED NOT DETECTED Final   A.calcoaceticus-baumannii NOT DETECTED NOT DETECTED Final   Bacteroides fragilis NOT DETECTED NOT DETECTED Final   Enterobacterales NOT DETECTED NOT DETECTED Final   Enterobacter cloacae complex NOT DETECTED NOT DETECTED Final   Escherichia coli NOT DETECTED NOT DETECTED Final   Klebsiella aerogenes NOT DETECTED NOT DETECTED Final   Klebsiella oxytoca NOT DETECTED NOT DETECTED Final   Klebsiella pneumoniae NOT DETECTED NOT DETECTED Final   Proteus species NOT DETECTED NOT DETECTED Final   Salmonella species NOT DETECTED NOT DETECTED Final   Serratia marcescens NOT DETECTED NOT DETECTED Final   Haemophilus influenzae NOT DETECTED NOT DETECTED Final   Neisseria meningitidis NOT DETECTED NOT DETECTED Final   Pseudomonas aeruginosa  NOT DETECTED NOT DETECTED Final   Stenotrophomonas maltophilia NOT DETECTED NOT DETECTED Final   Candida albicans NOT DETECTED NOT DETECTED Final   Candida auris NOT DETECTED NOT DETECTED Final   Candida glabrata NOT DETECTED NOT DETECTED Final   Candida krusei NOT DETECTED NOT DETECTED Final   Candida parapsilosis NOT DETECTED NOT DETECTED Final   Candida tropicalis NOT DETECTED NOT DETECTED Final   Cryptococcus neoformans/gattii NOT DETECTED NOT DETECTED Final    Comment: Performed at [facility], [address] [place], [place] [address]  Culture, blood (routine x 2)     Status: None   Collection Time: 07/09/21  5:24 AM   Specimen: BLOOD  Result Value Ref Range Status   Specimen Description   Final    BLOOD RIGHT ARM Performed at [place] [address], [place], [place] [address]    Special Requests   Final    BOTTLES DRAWN AEROBIC ONLY Blood Culture results may not be optimal due to an excessive volume of blood received in culture bottles Performed at [place] [address] [place], [place] [address]    Culture   Final    NO  GROWTH 5 DAYS Performed at [facility], [place] [address] [place], [place] [address]    Report Status 07/14/2021 FINAL  Final  Culture, blood (routine x 2)     Status: None   Collection Time: 07/09/21  5:24 AM   Specimen: BLOOD RIGHT HAND  Result Value Ref Range Status   Specimen Description   Final    BLOOD RIGHT HAND Performed at [place] [address] [place], [place] [address]    Special Requests   Final    BOTTLES DRAWN AEROBIC ONLY Blood Culture adequate volume Performed at [place] [address] [place], [place] [address]    Culture   Final    NO GROWTH 5 DAYS Performed at [facility], [place] [address] [place], [place] [address]    Report Status 07/14/2021 FINAL  Final     Labs: Basic Metabolic Panel: Recent Labs   Lab 07/10/21 0316 07/11/21 0355 07/12/21 0400 07/13/21 0405 07/14/21 0442  NA 126* 128* 132* 131* 131*  K 4.0 3.7 3.9 3.7 3.8  CL 97* 98 99 101 99  CO2 23 23 23 24 23   GLUCOSE 133* 143* 147* 155* 151*  BUN 18 16 18 18 16   CREATININE 0.82 1.00 0.86 0.92 0.88  CALCIUM 8.3* 8.3* 8.9 8.5* 8.7*  MG 2.0 2.1 2.0 2.1 2.0  PHOS 3.8 4.0 4.2 3.8 3.7   Liver Function Tests: Recent Labs  Lab 07/10/21 0316 07/11/21 0355 07/12/21 0400 07/13/21 0405 07/14/21 0442  AST 62* 48* 39 34 31  ALT 63* 60* 53* 44 38  ALKPHOS 61 60 55 59 59  BILITOT 0.9 0.7 0.6 0.7 0.6  PROT 6.9 7.0 7.0 7.1 7.2  ALBUMIN 3.2* 3.2* 3.2* 3.2* 3.2*   No results for input(s): LIPASE, AMYLASE in the last 168 hours. No results for input(s): AMMONIA in the last 168 hours. CBC: Recent Labs  Lab 07/10/21 0316 07/11/21 0355 07/12/21 0400 07/13/21 0405 07/13/21 1301 07/14/21 0442  WBC 0.8* 0.8* 1.1* 1.0*  --  1.5*  NEUTROABS 0.4* 0.3* 0.3* 0.3*  --  0.5*  HGB 7.7* 7.6* 7.3* 6.8* 8.4* 8.2*  HCT 22.6* 22.7* 22.2* 20.9* 25.7* 24.1*  MCV 86.9 89.0 88.8 90.1  --  90.9  PLT 11* 13* 13* 19*  --  20*   Cardiac Enzymes: No results for input(s): CKTOTAL, CKMB, CKMBINDEX, TROPONINI in the last 168 hours. BNP: BNP (last 3 results) Recent Labs    07/07/21 1106  BNP 825.3*    ProBNP (last 3 results) Recent Labs    12/09/20 1629  PROBNP 415*    CBG: Recent Labs  Lab 07/13/21 1651 07/13/21 2047 07/14/21 0727 07/14/21 1200 07/14/21 1616  GLUCAP 205* 181* 127* 353* 187*       Signed:  Annita Brod, MD [facility] 07/15/2021, 5:31 PM

## 2021-07-16 ENCOUNTER — Other Ambulatory Visit: Payer: Self-pay | Admitting: Family

## 2021-07-16 ENCOUNTER — Other Ambulatory Visit: Payer: Self-pay | Admitting: *Deleted

## 2021-07-16 ENCOUNTER — Encounter: Payer: Self-pay | Admitting: Hematology & Oncology

## 2021-07-16 ENCOUNTER — Other Ambulatory Visit (HOSPITAL_COMMUNITY): Payer: Self-pay

## 2021-07-16 DIAGNOSIS — E785 Hyperlipidemia, unspecified: Secondary | ICD-10-CM | POA: Diagnosis not present

## 2021-07-16 DIAGNOSIS — R5383 Other fatigue: Secondary | ICD-10-CM

## 2021-07-16 DIAGNOSIS — I10 Essential (primary) hypertension: Secondary | ICD-10-CM | POA: Diagnosis not present

## 2021-07-16 DIAGNOSIS — D46Z Other myelodysplastic syndromes: Secondary | ICD-10-CM

## 2021-07-16 DIAGNOSIS — I2510 Atherosclerotic heart disease of native coronary artery without angina pectoris: Secondary | ICD-10-CM | POA: Diagnosis not present

## 2021-07-16 DIAGNOSIS — B954 Other streptococcus as the cause of diseases classified elsewhere: Secondary | ICD-10-CM | POA: Diagnosis not present

## 2021-07-16 DIAGNOSIS — Z792 Long term (current) use of antibiotics: Secondary | ICD-10-CM | POA: Diagnosis not present

## 2021-07-16 DIAGNOSIS — R7881 Bacteremia: Secondary | ICD-10-CM | POA: Diagnosis not present

## 2021-07-16 DIAGNOSIS — J181 Lobar pneumonia, unspecified organism: Secondary | ICD-10-CM | POA: Diagnosis not present

## 2021-07-16 DIAGNOSIS — E119 Type 2 diabetes mellitus without complications: Secondary | ICD-10-CM | POA: Diagnosis not present

## 2021-07-16 DIAGNOSIS — Z6829 Body mass index (BMI) 29.0-29.9, adult: Secondary | ICD-10-CM | POA: Diagnosis not present

## 2021-07-16 DIAGNOSIS — J189 Pneumonia, unspecified organism: Secondary | ICD-10-CM | POA: Diagnosis not present

## 2021-07-16 DIAGNOSIS — D692 Other nonthrombocytopenic purpura: Secondary | ICD-10-CM | POA: Diagnosis not present

## 2021-07-16 DIAGNOSIS — Z452 Encounter for adjustment and management of vascular access device: Secondary | ICD-10-CM | POA: Diagnosis not present

## 2021-07-16 DIAGNOSIS — Z952 Presence of prosthetic heart valve: Secondary | ICD-10-CM | POA: Diagnosis not present

## 2021-07-16 DIAGNOSIS — D630 Anemia in neoplastic disease: Secondary | ICD-10-CM | POA: Diagnosis not present

## 2021-07-16 DIAGNOSIS — Z87891 Personal history of nicotine dependence: Secondary | ICD-10-CM | POA: Diagnosis not present

## 2021-07-16 DIAGNOSIS — D62 Acute posthemorrhagic anemia: Secondary | ICD-10-CM | POA: Diagnosis not present

## 2021-07-16 DIAGNOSIS — D469 Myelodysplastic syndrome, unspecified: Secondary | ICD-10-CM | POA: Diagnosis not present

## 2021-07-16 DIAGNOSIS — G4733 Obstructive sleep apnea (adult) (pediatric): Secondary | ICD-10-CM | POA: Diagnosis not present

## 2021-07-16 DIAGNOSIS — R531 Weakness: Secondary | ICD-10-CM

## 2021-07-16 DIAGNOSIS — B9689 Other specified bacterial agents as the cause of diseases classified elsewhere: Secondary | ICD-10-CM | POA: Diagnosis not present

## 2021-07-16 DIAGNOSIS — I440 Atrioventricular block, first degree: Secondary | ICD-10-CM | POA: Diagnosis not present

## 2021-07-16 DIAGNOSIS — N529 Male erectile dysfunction, unspecified: Secondary | ICD-10-CM | POA: Diagnosis not present

## 2021-07-16 DIAGNOSIS — K219 Gastro-esophageal reflux disease without esophagitis: Secondary | ICD-10-CM | POA: Diagnosis not present

## 2021-07-16 DIAGNOSIS — Z951 Presence of aortocoronary bypass graft: Secondary | ICD-10-CM | POA: Diagnosis not present

## 2021-07-16 DIAGNOSIS — E669 Obesity, unspecified: Secondary | ICD-10-CM | POA: Diagnosis not present

## 2021-07-16 DIAGNOSIS — I214 Non-ST elevation (NSTEMI) myocardial infarction: Secondary | ICD-10-CM | POA: Diagnosis not present

## 2021-07-19 ENCOUNTER — Other Ambulatory Visit: Payer: Self-pay | Admitting: *Deleted

## 2021-07-19 ENCOUNTER — Other Ambulatory Visit: Payer: Self-pay

## 2021-07-19 ENCOUNTER — Inpatient Hospital Stay: Payer: PPO | Attending: Hematology & Oncology

## 2021-07-19 ENCOUNTER — Telehealth: Payer: Self-pay | Admitting: *Deleted

## 2021-07-19 ENCOUNTER — Other Ambulatory Visit (HOSPITAL_BASED_OUTPATIENT_CLINIC_OR_DEPARTMENT_OTHER): Payer: Self-pay

## 2021-07-19 ENCOUNTER — Encounter: Payer: Self-pay | Admitting: Hematology & Oncology

## 2021-07-19 ENCOUNTER — Other Ambulatory Visit: Payer: Self-pay | Admitting: Hematology & Oncology

## 2021-07-19 ENCOUNTER — Inpatient Hospital Stay: Payer: PPO

## 2021-07-19 ENCOUNTER — Other Ambulatory Visit: Payer: Self-pay | Admitting: Family

## 2021-07-19 DIAGNOSIS — R531 Weakness: Secondary | ICD-10-CM | POA: Insufficient documentation

## 2021-07-19 DIAGNOSIS — D46Z Other myelodysplastic syndromes: Secondary | ICD-10-CM

## 2021-07-19 DIAGNOSIS — R5383 Other fatigue: Secondary | ICD-10-CM

## 2021-07-19 DIAGNOSIS — D649 Anemia, unspecified: Secondary | ICD-10-CM | POA: Insufficient documentation

## 2021-07-19 LAB — MISC LABCORP TEST (SEND OUT)
LabCorp test name: 182261
LabCorp test name: 182261
Labcorp test code: 182261
Labcorp test code: 182261

## 2021-07-19 LAB — CBC WITH DIFFERENTIAL (CANCER CENTER ONLY)
Abs Immature Granulocytes: 0.1 10*3/uL — ABNORMAL HIGH (ref 0.00–0.07)
Band Neutrophils: 2 %
Basophils Absolute: 0 10*3/uL (ref 0.0–0.1)
Basophils Relative: 0 %
Blasts: 7 %
Eosinophils Absolute: 0 10*3/uL (ref 0.0–0.5)
Eosinophils Relative: 0 %
HCT: 19.3 % — ABNORMAL LOW (ref 39.0–52.0)
Hemoglobin: 6.3 g/dL — CL (ref 13.0–17.0)
Lymphocytes Relative: 29 %
Lymphs Abs: 0.7 10*3/uL (ref 0.7–4.0)
MCH: 30.6 pg (ref 26.0–34.0)
MCHC: 32.6 g/dL (ref 30.0–36.0)
MCV: 93.7 fL (ref 80.0–100.0)
Metamyelocytes Relative: 3 %
Monocytes Absolute: 0 10*3/uL — ABNORMAL LOW (ref 0.1–1.0)
Monocytes Relative: 2 %
Myelocytes: 2 %
Neutro Abs: 1.4 10*3/uL — ABNORMAL LOW (ref 1.7–7.7)
Neutrophils Relative %: 55 %
Platelet Count: 18 10*3/uL — ABNORMAL LOW (ref 150–400)
RBC: 2.06 MIL/uL — ABNORMAL LOW (ref 4.22–5.81)
RDW: 19.9 % — ABNORMAL HIGH (ref 11.5–15.5)
WBC Count: 2.4 10*3/uL — ABNORMAL LOW (ref 4.0–10.5)
nRBC: 4 /100 WBC — ABNORMAL HIGH
nRBC: 5.5 % — ABNORMAL HIGH (ref 0.0–0.2)

## 2021-07-19 LAB — CMP (CANCER CENTER ONLY)
ALT: 18 U/L (ref 0–44)
AST: 21 U/L (ref 15–41)
Albumin: 3.5 g/dL (ref 3.5–5.0)
Alkaline Phosphatase: 67 U/L (ref 38–126)
Anion gap: 9 (ref 5–15)
BUN: 20 mg/dL (ref 8–23)
CO2: 22 mmol/L (ref 22–32)
Calcium: 9.2 mg/dL (ref 8.9–10.3)
Chloride: 95 mmol/L — ABNORMAL LOW (ref 98–111)
Creatinine: 1 mg/dL (ref 0.61–1.24)
GFR, Estimated: >60 mL/min (ref >60)
Glucose, Bld: 115 mg/dL — ABNORMAL HIGH (ref 70–99)
Potassium: 4 mmol/L (ref 3.5–5.1)
Sodium: 126 mmol/L — ABNORMAL LOW (ref 135–145)
Total Bilirubin: 0.6 mg/dL (ref 0.3–1.2)
Total Protein: 7.1 g/dL (ref 6.5–8.1)

## 2021-07-19 LAB — SEDIMENTATION RATE: Sed Rate: 78 mm/hr — ABNORMAL HIGH (ref 0–16)

## 2021-07-19 LAB — C-REACTIVE PROTEIN: CRP: 7.6 mg/dL — ABNORMAL HIGH (ref <1.0)

## 2021-07-19 LAB — LACTATE DEHYDROGENASE: LDH: 249 U/L — ABNORMAL HIGH (ref 98–192)

## 2021-07-19 LAB — SAMPLE TO BLOOD BANK

## 2021-07-19 LAB — PREPARE RBC (CROSSMATCH)

## 2021-07-19 MED ORDER — AMINOCAPROIC ACID 500 MG PO TABS: 1000.0000 mg | ORAL_TABLET | Freq: Two times a day (BID) | ORAL | 2 refills | Status: DC

## 2021-07-19 MED ORDER — TRANEXAMIC ACID 650 MG PO TABS
1300.0000 mg | ORAL_TABLET | Freq: Two times a day (BID) | ORAL | 1 refills | Status: DC
Start: 2021-07-19 — End: 2021-07-29
  Filled 2021-07-19: qty 120, 30d supply, fill #0

## 2021-07-19 MED ORDER — TRANEXAMIC ACID 650 MG PO TABS
ORAL_TABLET | ORAL | 0 refills | Status: DC
  Filled 2021-07-19: qty 60, 30d supply, fill #0

## 2021-07-19 MED ORDER — STERILE WATER FOR INJECTION IJ SOLN
10.0000 mL | Freq: Two times a day (BID) | ORAL | 2 refills | Status: DC
  Filled 2021-07-19: qty 300, 15d supply, fill #0

## 2021-07-19 MED ORDER — AMINOCAPROIC ACID 500 MG PO TABS
1000.0000 mg | ORAL_TABLET | Freq: Two times a day (BID) | ORAL | 2 refills | Status: DC
  Filled 2021-07-19: qty 10, 3d supply, fill #0

## 2021-07-19 NOTE — Progress Notes (Signed)
Pt.'s wife approached me and asked if Dr. Marin Olp would like for [facility] Jenkins to continue taking Amicar and if so if there is a generic form that he can prescribe since the brand name is so expensive. Dr. Marin Olp said that is ok for the patient to take 1000 mg once a day instead of three times a day. I called the pharmacy downstairs to find out if they have the generic and they said they have aminocaproic acid solution bottles of 236.5 ml with a 0.25 g / ml concentration. Pt said that she will call her insurance and find out if the generic brand is covered and will get back to [place] in regards changing the prescription.

## 2021-07-19 NOTE — Telephone Encounter (Signed)
Dr. Marin Olp notified of HGB-6.3 and platelets-18.  Orders received for pt to get one unit of blood today per Dr Marin Olp.

## 2021-07-19 NOTE — Patient Instructions (Signed)
https://www.redcrossblood.org/donate-blood/blood-donation-process/what-happens-to-donated-blood/blood-transfusions/types-of-blood-transfusions.html"> https://www.hematology.org/education/patients/blood-basics/blood-safety-and-matching"> https://www.nhlbi.nih.gov/health-topics/blood-transfusion">  Blood Transfusion, Adult A blood transfusion is a procedure in which you receive blood or a type of blood cell (blood component) through an IV. You may need a blood transfusion when your blood level is low. This may result from a bleeding disorder, illness, injury, or surgery. The blood may come from a donor. You may also be able to donate blood for yourself (autologous blood donation) before a planned surgery. The blood given in a transfusion is made up of different blood components. You may receive: Red blood cells. These carry oxygen to the cells in the body. Platelets. These help your blood to clot. Plasma. This is the liquid part of your blood. It carries proteins and other substances throughout the body. White blood cells. These help you fight infections. If you have hemophilia or another clotting disorder, you may also receive othertypes of blood products. Tell a health care provider about: Any blood disorders you have. Any previous reactions you have had during a blood transfusion. Any allergies you have. All medicines you are taking, including vitamins, herbs, eye drops, creams, and over-the-counter medicines. Any surgeries you have had. Any medical conditions you have, including any recent fever or cold symptoms. Whether you are pregnant or may be pregnant. What are the risks? Generally, this is a safe procedure. However, problems may occur. The most common problems include: A mild allergic reaction, such as red, swollen areas of skin (hives) and itching. Fever or chills. This may be the body's response to new blood cells received. This may occur during or up to 4 hours after the  transfusion. More serious problems may include: Transfusion-associated circulatory overload (TACO), or too much fluid in the lungs. This may cause breathing problems. A serious allergic reaction, such as difficulty breathing or swelling around the face and lips. Transfusion-related acute lung injury (TRALI), which causes breathing difficulty and low oxygen in the blood. This can occur within hours of the transfusion or several days later. Iron overload. This can happen after receiving many blood transfusions over a period of time. Infection or virus being transmitted. This is rare because donated blood is carefully tested before it is given. Hemolytic transfusion reaction. This is rare. It happens when your body's defense system (immune system)tries to attack the new blood cells. Symptoms may include fever, chills, nausea, low blood pressure, and low back or chest pain. Transfusion-associated graft-versus-host disease (TAGVHD). This is rare. It happens when donated cells attack your body's healthy tissues. What happens before the procedure? Medicines Ask your health care provider about: Changing or stopping your regular medicines. This is especially important if you are taking diabetes medicines or blood thinners. Taking medicines such as aspirin and ibuprofen. These medicines can thin your blood. Do not take these medicines unless your health care provider tells you to take them. Taking over-the-counter medicines, vitamins, herbs, and supplements. General instructions Follow instructions from your health care provider about eating and drinking restrictions. You will have a blood test to determine your blood type. This is necessary to know what kind of blood your body will accept and to match it to the donor blood. If you are going to have a planned surgery, you may be able to do an autologous blood donation. This may be done in case you need to have a transfusion. You will have your temperature,  blood pressure, and pulse monitored before the transfusion. If you have had an allergic reaction to a transfusion in the past, you may be given   medicine to help prevent a reaction. This medicine may be given to you by mouth (orally) or through an IV. Set aside time for the blood transfusion. This procedure generally takes 1-4 hours to complete. What happens during the procedure?  An IV will be inserted into one of your veins. The bag of donated blood will be attached to your IV. The blood will then enter through your vein. Your temperature, blood pressure, and pulse will be monitored regularly during the transfusion. This monitoring is done to detect early signs of a transfusion reaction. Tell your nurse right away if you have any of these symptoms during the transfusion: Shortness of breath or trouble breathing. Chest or back pain. Fever or chills. Hives or itching. If you have any signs or symptoms of a reaction, your transfusion will be stopped and you may be given medicine. When the transfusion is complete, your IV will be removed. Pressure may be applied to the IV site for a few minutes. A bandage (dressing)will be applied. The procedure may vary among health care providers and hospitals. What happens after the procedure? Your temperature, blood pressure, pulse, breathing rate, and blood oxygen level will be monitored until you leave the hospital or clinic. Your blood may be tested to see how you are responding to the transfusion. You may be warmed with fluids or blankets to maintain a normal body temperature. If you receive your blood transfusion in an outpatient setting, you will be told whom to contact to report any reactions. Where to find more information For more information on blood transfusions, visit the American Red Cross: redcross.org Summary A blood transfusion is a procedure in which you receive blood or a type of blood cell (blood component) through an IV. The blood you  receive may come from a donor or be donated by yourself (autologous blood donation) before a planned surgery. The blood given in a transfusion is made up of different blood components. You may receive red blood cells, platelets, plasma, or white blood cells depending on the condition treated. Your temperature, blood pressure, and pulse will be monitored before, during, and after the transfusion. After the transfusion, your blood may be tested to see how your body has responded. This information is not intended to replace advice given to you by your health care provider. Make sure you discuss any questions you have with your healthcare provider. Document Revised: 10/03/2019 Document Reviewed: 05/23/2019 Elsevier Patient Education  2022 [employer]vier Inc.  

## 2021-07-19 NOTE — Patient Instructions (Signed)
Implanted Port Home Guide An implanted port is a device that is placed under the skin. It is usually placed in the chest. The device can be used to give IV medicine, to take blood, or for dialysis. You may have an implanted port if: You need IV medicine that would be irritating to the small veins in your hands or arms. You need IV medicines, such as antibiotics, for a long period of time. You need IV nutrition for a long period of time. You need dialysis. When you have a port, your health care provider can choose to use the port instead of veins in your arms for these procedures. You may have fewer limitations when using a port than you would if you used other types of long-term IVs, and you will likely be able to return to normal activities afteryour incision heals. An implanted port has two main parts: Reservoir. The reservoir is the part where a needle is inserted to give medicines or draw blood. The reservoir is round. After it is placed, it appears as a small, raised area under your skin. Catheter. The catheter is a thin, flexible tube that connects the reservoir to a vein. Medicine that is inserted into the reservoir goes into the catheter and then into the vein. How is my port accessed? To access your port: A numbing cream may be placed on the skin over the port site. Your health care provider will put on a mask and sterile gloves. The skin over your port will be cleaned carefully with a germ-killing soap and allowed to dry. Your health care provider will gently pinch the port and insert a needle into it. Your health care provider will check for a blood return to make sure the port is in the vein and is not clogged. If your port needs to remain accessed to get medicine continuously (constant infusion), your health care provider will place a clear bandage (dressing) over the needle site. The dressing and needle will need to be changed every week, or as told by your health care provider. What  is flushing? Flushing helps keep the port from getting clogged. Follow instructions from your health care provider about how and when to flush the port. Ports are usually flushed with saline solution or a medicine called heparin. The need for flushing will depend on how the port is used: If the port is only used from time to time to give medicines or draw blood, the port may need to be flushed: Before and after medicines have been given. Before and after blood has been drawn. As part of routine maintenance. Flushing may be recommended every 4-6 weeks. If a constant infusion is running, the port may not need to be flushed. Throw away any syringes in a disposal container that is meant for sharp items (sharps container). You can buy a sharps container from a pharmacy, or you can make one by using an empty hard plastic bottle with a cover. How long will my port stay implanted? The port can stay in for as long as your health care provider thinks it is needed. When it is time for the port to come out, a surgery will be done to remove it. The surgery will be similar to the procedure that was done to putthe port in. Follow these instructions at home:  Flush your port as told by your health care provider. If you need an infusion over several days, follow instructions from your health care provider about how to take   care of your port site. Make sure you: Wash your hands with soap and water before you change your dressing. If soap and water are not available, use alcohol-based hand sanitizer. Change your dressing as told by your health care provider. Place any used dressings or infusion bags into a plastic bag. Throw that bag in the trash. Keep the dressing that covers the needle clean and dry. Do not get it wet. Do not use scissors or sharp objects near the tube. Keep the tube clamped, unless it is being used. Check your port site every day for signs of infection. Check for: Redness, swelling, or  pain. Fluid or blood. Pus or a bad smell. Protect the skin around the port site. Avoid wearing bra straps that rub or irritate the site. Protect the skin around your port from seat belts. Place a soft pad over your chest if needed. Bathe or shower as told by your health care provider. The site may get wet as long as you are not actively receiving an infusion. Return to your normal activities as told by your health care provider. Ask your health care provider what activities are safe for you. Carry a medical alert card or wear a medical alert bracelet at all times. This will let health care providers know that you have an implanted port in case of an emergency. Get help right away if: You have redness, swelling, or pain at the port site. You have fluid or blood coming from your port site. You have pus or a bad smell coming from the port site. You have a fever. Summary Implanted ports are usually placed in the chest for long-term IV access. Follow instructions from your health care provider about flushing the port and changing bandages (dressings). Take care of the area around your port by avoiding clothing that puts pressure on the area, and by watching for signs of infection. Protect the skin around your port from seat belts. Place a soft pad over your chest if needed. Get help right away if you have a fever or you have redness, swelling, pain, drainage, or a bad smell at the port site. This information is not intended to replace advice given to you by your health care provider. Make sure you discuss any questions you have with your healthcare provider. Document Revised: 04/13/2020 Document Reviewed: 04/13/2020 Elsevier Patient Education  2022 [employer]  

## 2021-07-20 ENCOUNTER — Other Ambulatory Visit (HOSPITAL_COMMUNITY): Payer: Self-pay

## 2021-07-20 LAB — BPAM RBC
Blood Product Expiration Date: 202209012359
ISSUE DATE / TIME: 202208081331
Unit Type and Rh: 6200

## 2021-07-20 LAB — TYPE AND SCREEN
ABO/RH(D): A POS
Antibody Screen: NEGATIVE
Unit division: 0

## 2021-07-21 ENCOUNTER — Other Ambulatory Visit (HOSPITAL_COMMUNITY): Payer: Self-pay

## 2021-07-21 ENCOUNTER — Other Ambulatory Visit (HOSPITAL_BASED_OUTPATIENT_CLINIC_OR_DEPARTMENT_OTHER): Payer: Self-pay

## 2021-07-22 ENCOUNTER — Other Ambulatory Visit: Payer: Self-pay | Admitting: Hematology & Oncology

## 2021-07-22 NOTE — Progress Notes (Signed)
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     A cycle is every 28 days:     Azacitidine   **Always confirm dose/schedule in your pharmacy ordering system**  REASON: Disease Progression PRIOR TREATMENT: [id number]: Azacitidine 75 mg/m2 Subcutaneously Days 1-7 q28 Days TREATMENT RESPONSE: Stable Disease (SD)  START OFF PATHWAY REGIMEN - MDS   [id number]:Decitabine:   A cycle is every 28 days:     Decitabine   **Always confirm dose/schedule in your pharmacy ordering system**  Patient Characteristics: Higher-Risk (IPSS-R Score > 3.5), Second Line, Not a Transplant Candidate WHO Disease Classification: MDS-EB2 Bone Marrow Blasts (percent): > 10% Cytogenetic Category: Very Good Platelets (x 10^9/L): < 50 Absolute Neutrophil Count (x 10^9/L): < 0.8 Line of Therapy: Second Line IPSS-R Risk Category: High IPSS-R Risk Score: 5.5 Check here if patient's risk score was calculated prior to the International Prognostic Scoring System-Revised (IPSS-R): false Hemoglobin (g/dl): 8 to < 10 Patient Characteristics: Not a Transplant Candidate Intent of Therapy: Non-Curative / Palliative Intent, Discussed with Patient

## 2021-07-24 ENCOUNTER — Emergency Department (HOSPITAL_COMMUNITY): Payer: PPO

## 2021-07-24 ENCOUNTER — Other Ambulatory Visit: Payer: Self-pay

## 2021-07-24 ENCOUNTER — Inpatient Hospital Stay (HOSPITAL_COMMUNITY)
Admission: EM | Admit: 2021-07-24 | Discharge: 2021-07-29 | DRG: 871 | Disposition: A | Discharge status: Hospice | Payer: PPO | Attending: Internal Medicine | Admitting: Internal Medicine

## 2021-07-24 DIAGNOSIS — Z66 Do not resuscitate: Secondary | ICD-10-CM | POA: Diagnosis present

## 2021-07-24 DIAGNOSIS — R627 Adult failure to thrive: Secondary | ICD-10-CM | POA: Diagnosis present

## 2021-07-24 DIAGNOSIS — J9601 Acute respiratory failure with hypoxia: Secondary | ICD-10-CM | POA: Diagnosis present

## 2021-07-24 DIAGNOSIS — J188 Other pneumonia, unspecified organism: Secondary | ICD-10-CM

## 2021-07-24 DIAGNOSIS — D6959 Other secondary thrombocytopenia: Secondary | ICD-10-CM | POA: Diagnosis present

## 2021-07-24 DIAGNOSIS — Z955 Presence of coronary angioplasty implant and graft: Secondary | ICD-10-CM

## 2021-07-24 DIAGNOSIS — R000 Tachycardia, unspecified: Secondary | ICD-10-CM | POA: Diagnosis not present

## 2021-07-24 DIAGNOSIS — J189 Pneumonia, unspecified organism: Secondary | ICD-10-CM | POA: Diagnosis present

## 2021-07-24 DIAGNOSIS — Z952 Presence of prosthetic heart valve: Secondary | ICD-10-CM | POA: Diagnosis not present

## 2021-07-24 DIAGNOSIS — D61818 Other pancytopenia: Secondary | ICD-10-CM | POA: Diagnosis not present

## 2021-07-24 DIAGNOSIS — I252 Old myocardial infarction: Secondary | ICD-10-CM

## 2021-07-24 DIAGNOSIS — K922 Gastrointestinal hemorrhage, unspecified: Secondary | ICD-10-CM | POA: Diagnosis not present

## 2021-07-24 DIAGNOSIS — R531 Weakness: Secondary | ICD-10-CM | POA: Diagnosis not present

## 2021-07-24 DIAGNOSIS — D696 Thrombocytopenia, unspecified: Secondary | ICD-10-CM | POA: Diagnosis not present

## 2021-07-24 DIAGNOSIS — Z9221 Personal history of antineoplastic chemotherapy: Secondary | ICD-10-CM

## 2021-07-24 DIAGNOSIS — R0602 Shortness of breath: Secondary | ICD-10-CM | POA: Diagnosis not present

## 2021-07-24 DIAGNOSIS — R7881 Bacteremia: Secondary | ICD-10-CM | POA: Diagnosis not present

## 2021-07-24 DIAGNOSIS — K5900 Constipation, unspecified: Secondary | ICD-10-CM | POA: Diagnosis present

## 2021-07-24 DIAGNOSIS — R0902 Hypoxemia: Secondary | ICD-10-CM | POA: Diagnosis not present

## 2021-07-24 DIAGNOSIS — K921 Melena: Secondary | ICD-10-CM | POA: Diagnosis not present

## 2021-07-24 DIAGNOSIS — D469 Myelodysplastic syndrome, unspecified: Secondary | ICD-10-CM

## 2021-07-24 DIAGNOSIS — Z515 Encounter for palliative care: Secondary | ICD-10-CM | POA: Diagnosis not present

## 2021-07-24 DIAGNOSIS — Z953 Presence of xenogenic heart valve: Secondary | ICD-10-CM | POA: Diagnosis not present

## 2021-07-24 DIAGNOSIS — R6520 Severe sepsis without septic shock: Secondary | ICD-10-CM | POA: Diagnosis not present

## 2021-07-24 DIAGNOSIS — Z87891 Personal history of nicotine dependence: Secondary | ICD-10-CM | POA: Diagnosis not present

## 2021-07-24 DIAGNOSIS — D46Z Other myelodysplastic syndromes: Secondary | ICD-10-CM | POA: Diagnosis not present

## 2021-07-24 DIAGNOSIS — Z8711 Personal history of peptic ulcer disease: Secondary | ICD-10-CM

## 2021-07-24 DIAGNOSIS — I21A1 Myocardial infarction type 2: Secondary | ICD-10-CM | POA: Diagnosis not present

## 2021-07-24 DIAGNOSIS — K2270 Barrett's esophagus without dysplasia: Secondary | ICD-10-CM | POA: Diagnosis not present

## 2021-07-24 DIAGNOSIS — E119 Type 2 diabetes mellitus without complications: Secondary | ICD-10-CM | POA: Diagnosis not present

## 2021-07-24 DIAGNOSIS — Z7189 Other specified counseling: Secondary | ICD-10-CM | POA: Diagnosis not present

## 2021-07-24 DIAGNOSIS — Z20822 Contact with and (suspected) exposure to covid-19: Secondary | ICD-10-CM | POA: Diagnosis present

## 2021-07-24 DIAGNOSIS — I330 Acute and subacute infective endocarditis: Secondary | ICD-10-CM | POA: Diagnosis not present

## 2021-07-24 DIAGNOSIS — I2510 Atherosclerotic heart disease of native coronary artery without angina pectoris: Secondary | ICD-10-CM | POA: Diagnosis not present

## 2021-07-24 DIAGNOSIS — J9600 Acute respiratory failure, unspecified whether with hypoxia or hypercapnia: Secondary | ICD-10-CM | POA: Diagnosis not present

## 2021-07-24 DIAGNOSIS — B952 Enterococcus as the cause of diseases classified elsewhere: Secondary | ICD-10-CM | POA: Diagnosis present

## 2021-07-24 DIAGNOSIS — A419 Sepsis, unspecified organism: Principal | ICD-10-CM

## 2021-07-24 DIAGNOSIS — D638 Anemia in other chronic diseases classified elsewhere: Secondary | ICD-10-CM | POA: Diagnosis present

## 2021-07-24 DIAGNOSIS — K30 Functional dyspepsia: Secondary | ICD-10-CM | POA: Diagnosis present

## 2021-07-24 DIAGNOSIS — Z79899 Other long term (current) drug therapy: Secondary | ICD-10-CM

## 2021-07-24 DIAGNOSIS — I499 Cardiac arrhythmia, unspecified: Secondary | ICD-10-CM | POA: Diagnosis not present

## 2021-07-24 DIAGNOSIS — Z7984 Long term (current) use of oral hypoglycemic drugs: Secondary | ICD-10-CM

## 2021-07-24 DIAGNOSIS — E872 Acidosis: Secondary | ICD-10-CM | POA: Diagnosis present

## 2021-07-24 DIAGNOSIS — Z885 Allergy status to narcotic agent status: Secondary | ICD-10-CM

## 2021-07-24 DIAGNOSIS — E871 Hypo-osmolality and hyponatremia: Secondary | ICD-10-CM | POA: Diagnosis present

## 2021-07-24 DIAGNOSIS — Z951 Presence of aortocoronary bypass graft: Secondary | ICD-10-CM

## 2021-07-24 DIAGNOSIS — Z8249 Family history of ischemic heart disease and other diseases of the circulatory system: Secondary | ICD-10-CM

## 2021-07-24 DIAGNOSIS — F32A Depression, unspecified: Secondary | ICD-10-CM | POA: Diagnosis not present

## 2021-07-24 DIAGNOSIS — I10 Essential (primary) hypertension: Secondary | ICD-10-CM

## 2021-07-24 DIAGNOSIS — D649 Anemia, unspecified: Secondary | ICD-10-CM

## 2021-07-24 LAB — LACTIC ACID, PLASMA
Lactic Acid, Venous: 3.1 mmol/L (ref 0.5–1.9)
Lactic Acid, Venous: 4.7 mmol/L (ref 0.5–1.9)

## 2021-07-24 LAB — CBC WITH DIFFERENTIAL/PLATELET
Abs Immature Granulocytes: 0.27 10*3/uL — ABNORMAL HIGH (ref 0.00–0.07)
Basophils Absolute: 0 10*3/uL (ref 0.0–0.1)
Basophils Relative: 0 %
Eosinophils Absolute: 0 10*3/uL (ref 0.0–0.5)
Eosinophils Relative: 0 %
HCT: 17.4 % — ABNORMAL LOW (ref 39.0–52.0)
Hemoglobin: 5.7 g/dL — CL (ref 13.0–17.0)
Immature Granulocytes: 10 %
Lymphocytes Relative: 13 %
Lymphs Abs: 0.4 10*3/uL — ABNORMAL LOW (ref 0.7–4.0)
MCH: 31.5 pg (ref 26.0–34.0)
MCHC: 32.8 g/dL (ref 30.0–36.0)
MCV: 96.1 fL (ref 80.0–100.0)
Monocytes Absolute: 0.8 10*3/uL (ref 0.1–1.0)
Monocytes Relative: 28 %
Neutro Abs: 1.4 10*3/uL — ABNORMAL LOW (ref 1.7–7.7)
Neutrophils Relative %: 49 %
Platelets: 19 10*3/uL — CL (ref 150–400)
RBC: 1.81 MIL/uL — ABNORMAL LOW (ref 4.22–5.81)
RDW: 20.8 % — ABNORMAL HIGH (ref 11.5–15.5)
WBC: 2.8 10*3/uL — ABNORMAL LOW (ref 4.0–10.5)
nRBC: 6.1 % — ABNORMAL HIGH (ref 0.0–0.2)

## 2021-07-24 LAB — COMPREHENSIVE METABOLIC PANEL
ALT: 17 U/L (ref 0–44)
AST: 28 U/L (ref 15–41)
Albumin: 2.9 g/dL — ABNORMAL LOW (ref 3.5–5.0)
Alkaline Phosphatase: 65 U/L (ref 38–126)
Anion gap: 10 (ref 5–15)
BUN: 31 mg/dL — ABNORMAL HIGH (ref 8–23)
CO2: 18 mmol/L — ABNORMAL LOW (ref 22–32)
Calcium: 8.3 mg/dL — ABNORMAL LOW (ref 8.9–10.3)
Chloride: 97 mmol/L — ABNORMAL LOW (ref 98–111)
Creatinine, Ser: 1.18 mg/dL (ref 0.61–1.24)
GFR, Estimated: >60 mL/min (ref >60)
Glucose, Bld: 169 mg/dL — ABNORMAL HIGH (ref 70–99)
Potassium: 4.1 mmol/L (ref 3.5–5.1)
Sodium: 125 mmol/L — ABNORMAL LOW (ref 135–145)
Total Bilirubin: 1 mg/dL (ref 0.3–1.2)
Total Protein: 6.8 g/dL (ref 6.5–8.1)

## 2021-07-24 LAB — TROPONIN I (HIGH SENSITIVITY)
Troponin I (High Sensitivity): 55 ng/L — ABNORMAL HIGH (ref <18)
Troponin I (High Sensitivity): 70 ng/L — ABNORMAL HIGH (ref <18)

## 2021-07-24 LAB — CBG MONITORING, ED
Glucose-Capillary: 172 mg/dL — ABNORMAL HIGH (ref 70–99)
Glucose-Capillary: 178 mg/dL — ABNORMAL HIGH (ref 70–99)
Glucose-Capillary: 191 mg/dL — ABNORMAL HIGH (ref 70–99)

## 2021-07-24 LAB — PROTIME-INR
INR: 1.3 — ABNORMAL HIGH (ref 0.8–1.2)
Prothrombin Time: 15.7 seconds — ABNORMAL HIGH (ref 11.4–15.2)

## 2021-07-24 LAB — SARS CORONAVIRUS 2 (TAT 6-24 HRS): SARS Coronavirus 2: NEGATIVE

## 2021-07-24 LAB — MRSA NEXT GEN BY PCR, NASAL: MRSA by PCR Next Gen: NOT DETECTED

## 2021-07-24 LAB — GLUCOSE, CAPILLARY: Glucose-Capillary: 169 mg/dL — ABNORMAL HIGH (ref 70–99)

## 2021-07-24 LAB — POC OCCULT BLOOD, ED: Fecal Occult Bld: POSITIVE — AB

## 2021-07-24 LAB — BRAIN NATRIURETIC PEPTIDE: B Natriuretic Peptide: 1085.3 pg/mL — ABNORMAL HIGH (ref 0.0–100.0)

## 2021-07-24 LAB — PREPARE RBC (CROSSMATCH)

## 2021-07-24 LAB — APTT: aPTT: 39 seconds — ABNORMAL HIGH (ref 24–36)

## 2021-07-24 MED ORDER — SODIUM CHLORIDE 0.9% IV SOLUTION: Freq: Once | INTRAVENOUS | Status: AC

## 2021-07-24 MED ORDER — VALACYCLOVIR HCL 500 MG PO TABS
1000.0000 mg | ORAL_TABLET | Freq: Every day | ORAL | Status: DC
Start: 2021-07-24 — End: 2021-07-30
  Administered 2021-07-24 – 2021-07-29 (×6): 1000 mg via ORAL
  Filled 2021-07-24 (×6): qty 2

## 2021-07-24 MED ORDER — FLUCONAZOLE 100 MG PO TABS
100.0000 mg | ORAL_TABLET | Freq: Every day | ORAL | Status: DC
  Administered 2021-07-24 – 2021-07-29 (×6): 100 mg via ORAL
  Filled 2021-07-24 (×6): qty 1

## 2021-07-24 MED ORDER — SODIUM CHLORIDE 0.9% FLUSH
10.0000 mL | Freq: Two times a day (BID) | INTRAVENOUS | Status: DC
  Administered 2021-07-25 – 2021-07-29 (×7): 10 mL

## 2021-07-24 MED ORDER — PANTOPRAZOLE INFUSION (NEW) - SIMPLE MED
8.0000 mg/h | INTRAVENOUS | Status: AC
  Administered 2021-07-24 – 2021-07-26 (×5): 8 mg/h via INTRAVENOUS
  Filled 2021-07-24 (×2): qty 100
  Filled 2021-07-24: qty 80
  Filled 2021-07-24: qty 100
  Filled 2021-07-24 (×4): qty 80

## 2021-07-24 MED ORDER — CHLORHEXIDINE GLUCONATE CLOTH 2 % EX PADS
6.0000 | MEDICATED_PAD | Freq: Every day | CUTANEOUS | Status: DC
  Administered 2021-07-24 – 2021-07-29 (×6): 6 via TOPICAL

## 2021-07-24 MED ORDER — SODIUM CHLORIDE 0.9% FLUSH: 10.0000 mL | INTRAVENOUS | Status: DC | PRN

## 2021-07-24 MED ORDER — FLUTICASONE PROPIONATE 50 MCG/ACT NA SUSP: 2.0000 | Freq: Every day | NASAL | Status: DC | PRN

## 2021-07-24 MED ORDER — PANTOPRAZOLE SODIUM 40 MG IV SOLR
40.0000 mg | Freq: Once | INTRAVENOUS | Status: DC
  Filled 2021-07-24: qty 40

## 2021-07-24 MED ORDER — CHLORHEXIDINE GLUCONATE 0.12 % MT SOLN
15.0000 mL | Freq: Two times a day (BID) | OROMUCOSAL | Status: DC
  Administered 2021-07-24 – 2021-07-29 (×10): 15 mL via OROMUCOSAL
  Filled 2021-07-24 (×11): qty 15

## 2021-07-24 MED ORDER — VANCOMYCIN HCL 1750 MG/350ML IV SOLN
1750.0000 mg | INTRAVENOUS | Status: DC
  Administered 2021-07-24 – 2021-07-28 (×5): 1750 mg via INTRAVENOUS
  Filled 2021-07-24 (×5): qty 350

## 2021-07-24 MED ORDER — SODIUM CHLORIDE 0.9 % IV SOLN
2.0000 g | Freq: Once | INTRAVENOUS | Status: AC
  Administered 2021-07-24: 2 g via INTRAVENOUS
  Filled 2021-07-24: qty 2

## 2021-07-24 MED ORDER — SODIUM CHLORIDE 0.9 % IV SOLN: INTRAVENOUS | Status: DC

## 2021-07-24 MED ORDER — SODIUM CHLORIDE 0.9 % IV SOLN
2.0000 g | Freq: Three times a day (TID) | INTRAVENOUS | Status: DC
  Administered 2021-07-24 – 2021-07-28 (×10): 2 g via INTRAVENOUS
  Filled 2021-07-24 (×12): qty 2

## 2021-07-24 MED ORDER — ROSUVASTATIN CALCIUM 5 MG PO TABS
10.0000 mg | ORAL_TABLET | Freq: Every day | ORAL | Status: DC
  Administered 2021-07-24 – 2021-07-28 (×5): 10 mg via ORAL
  Filled 2021-07-24 (×5): qty 2

## 2021-07-24 MED ORDER — INSULIN ASPART 100 UNIT/ML IJ SOLN
0.0000 [IU] | INTRAMUSCULAR | Status: DC
  Administered 2021-07-24 – 2021-07-25 (×5): 3 [IU] via SUBCUTANEOUS
  Administered 2021-07-25: 2 [IU] via SUBCUTANEOUS
  Administered 2021-07-25: 3 [IU] via SUBCUTANEOUS
  Administered 2021-07-25: 2 [IU] via SUBCUTANEOUS
  Administered 2021-07-25 – 2021-07-26 (×3): 3 [IU] via SUBCUTANEOUS
  Administered 2021-07-26: 5 [IU] via SUBCUTANEOUS
  Administered 2021-07-26: 3 [IU] via SUBCUTANEOUS
  Administered 2021-07-26: 5 [IU] via SUBCUTANEOUS
  Administered 2021-07-26: 3 [IU] via SUBCUTANEOUS
  Administered 2021-07-27: 5 [IU] via SUBCUTANEOUS
  Administered 2021-07-27 (×2): 3 [IU] via SUBCUTANEOUS
  Administered 2021-07-27: 2 [IU] via SUBCUTANEOUS
  Administered 2021-07-27 – 2021-07-28 (×6): 3 [IU] via SUBCUTANEOUS
  Administered 2021-07-28 (×2): 5 [IU] via SUBCUTANEOUS
  Administered 2021-07-29: 8 [IU] via SUBCUTANEOUS
  Administered 2021-07-29 (×2): 3 [IU] via SUBCUTANEOUS
  Administered 2021-07-29: 8 [IU] via SUBCUTANEOUS
  Administered 2021-07-29: 5 [IU] via SUBCUTANEOUS

## 2021-07-24 MED ORDER — PANTOPRAZOLE 80MG IVPB - SIMPLE MED
80.0000 mg | Freq: Once | INTRAVENOUS | Status: AC
  Administered 2021-07-24: 80 mg via INTRAVENOUS
  Filled 2021-07-24: qty 80

## 2021-07-24 MED ORDER — LORATADINE 10 MG PO TABS
10.0000 mg | ORAL_TABLET | Freq: Every morning | ORAL | Status: DC
  Administered 2021-07-24 – 2021-07-29 (×6): 10 mg via ORAL
  Filled 2021-07-24 (×6): qty 1

## 2021-07-24 MED ORDER — ENSURE MAX PROTEIN PO LIQD
11.0000 [oz_av] | Freq: Two times a day (BID) | ORAL | Status: DC
  Administered 2021-07-24 – 2021-07-29 (×7): 11 [oz_av] via ORAL
  Filled 2021-07-24 (×14): qty 330

## 2021-07-24 MED ORDER — OXYMETAZOLINE HCL 0.05 % NA SOLN
1.0000 | Freq: Two times a day (BID) | NASAL | Status: AC
  Administered 2021-07-24 – 2021-07-26 (×6): 1 via NASAL
  Filled 2021-07-24: qty 30

## 2021-07-24 MED ORDER — ACETAMINOPHEN 325 MG PO TABS
650.0000 mg | ORAL_TABLET | Freq: Four times a day (QID) | ORAL | Status: DC | PRN
  Administered 2021-07-26: 650 mg via ORAL
  Filled 2021-07-24: qty 2

## 2021-07-24 MED ORDER — GABAPENTIN 300 MG PO CAPS
600.0000 mg | ORAL_CAPSULE | Freq: Every day | ORAL | Status: DC
  Administered 2021-07-24 – 2021-07-28 (×5): 600 mg via ORAL
  Filled 2021-07-24 (×5): qty 2

## 2021-07-24 MED ORDER — VANCOMYCIN HCL 1500 MG/300ML IV SOLN
1500.0000 mg | Freq: Once | INTRAVENOUS | Status: DC
  Filled 2021-07-24: qty 300

## 2021-07-24 MED ORDER — SALINE SPRAY 0.65 % NA SOLN
2.0000 | NASAL | Status: DC
  Administered 2021-07-25 – 2021-07-29 (×39): 2 via NASAL
  Filled 2021-07-24: qty 44

## 2021-07-24 MED ORDER — ADULT MULTIVITAMIN W/MINERALS CH
1.0000 | ORAL_TABLET | Freq: Every morning | ORAL | Status: DC
  Administered 2021-07-24 – 2021-07-28 (×5): 1 via ORAL
  Filled 2021-07-24 (×5): qty 1

## 2021-07-24 MED ORDER — PANTOPRAZOLE SODIUM 40 MG IV SOLR
40.0000 mg | Freq: Two times a day (BID) | INTRAVENOUS | Status: DC
  Administered 2021-07-27 – 2021-07-28 (×2): 40 mg via INTRAVENOUS
  Filled 2021-07-24 (×2): qty 40

## 2021-07-24 MED ORDER — ACETAMINOPHEN 650 MG RE SUPP: 650.0000 mg | Freq: Four times a day (QID) | RECTAL | Status: DC | PRN

## 2021-07-24 MED ORDER — MUPIROCIN 2 % EX OINT: TOPICAL_OINTMENT | Freq: Two times a day (BID) | CUTANEOUS | Status: DC

## 2021-07-24 NOTE — Consult Note (Addendum)
Attending physician's note   I have taken an interval history, reviewed the chart and examined the patient. I agree with the Advanced Practitioner's note, impression, and recommendations as outlined.   75 year old male with medical history as outlined below, including severe MDS currently on chemotherapy, presenting with SOB and chest pain.  Admission H/H 5.7/17.4. Hgb has been hovering 6-7, with intermittent PRBC transfusions, along with intermittent PLT transfusions for severe thrombocytopenia.  He states he has been having dark stool since starting chemotherapy several months ago.  Otherwise no recent changes from that baseline.  - No plan for endoscopic evaluation at this juncture. - Agree with treating with high-dose IV PPI for now. - Blood products per Oncology service - GI service will remain available as needed.  Please do not hesitate to contact the inpatient service if new questions or concerns should arise  Cephas Darby, FACG [phone] office           Consultation  Referring Provider: TRH/ Amery MD Primary Care Physician:  Lezlie Octave, PA-C Primary Gastroenterologist: Dr. Doylene Canning [facility]  Reason for Consultation: Heme positive stool, anemia in setting of severe myelodysplastic syndrome, high-grade on chemo  HPI: Theodore Jenkins is a 75 y.o. male who is being admitted through the emergency room today after presenting with 1 day history of shortness of breath and chest pain. Patient had a NSTEMI last month, currently felt related to anemia. Patient has been undergoing chemotherapy for high-grade myelodysplastic syndrome and last had chemo on 06/24/2021. Initial labs revealed lactate of 3.1 WBC 2.8/hemoglobin 5.7/platelets 19,000 Troponin 55 Pro time 15.7/INR 1.3 BUN 31/creatinine 1.18 BNP 1085 LFTs within normal limits Chest x-ray shows worsening right lower lobe pneumonia  Reviewing his labs-hemoglobin was 6.3 on 07/19/2021,.   He has required intermittent transfusions. June 2022 hemoglobin 6.8-6.1 range April 2022 hemoglobin 7-7.4 range  Patient had undergone colonoscopy in June 2021 at [facility] for surveillance with history of polyps.  Was noted to have pandiverticulosis and otherwise normal exam. 's history of Barrett's esophagus, it looks as if he was supposed to have another EGD in 2020 but I do not see that this occurred.  Last EGD 2017 per notes in [facility]. Patient has history of previous endocarditis. Aortic stenosis status post bioprosthetic valve replacement 2018 Adult onset diabetes mellitus, hypertension, ischemic heart disease with EF of 50 to 55% and remote coronary stent in 2005.  Patient states that he stays on omeprazole at home which controls any heartburn or indigestion.  No complaints of dysphagia or odynophagia.  He says at the time of last colonoscopy decision was made that he probably did not need to undergo further surveillance EGDs. Patient says he has had black sort of sticky stools ever since he started chemotherapy several months ago.  He says he does have issues with constipation, uses MiraLAX as needed and sometimes this will cause diarrhea.  He has not noticed any bright red blood in his stool.  No complaints of abdominal pain, no nausea or vomiting. He is not on any aspirin or NSAIDs, no iron Rx. Did have 1 episode of small-volume hemoptysis today after he arrived in the ER, says this was about the size of a quarter.  That was the first time this had occurred.   Past Medical History:  Diagnosis Date   Aortic stenosis    Echo (08/2017): Severe AS with mean gradient of 42 mmHg.   Barrett's esophagus    CAD (coronary artery  disease) 06/06/2011   Remote stent to the LAD in 2005 // Last cath in 2010 showing diffuse CAD, small PD that is occluded and fills with left to right collaterals, 60-70% 1st OM, with normal EF. Managed medically // Myoview 3/18:  Normal perfusion. LVEF 55%  with normal wall motion. This is a low risk study.   Complication of anesthesia    Diabetes mellitus    ED (erectile dysfunction)    Family history of adverse reaction to anesthesia 10/23/2017   sister had history of malignant hyperthermia when she was 41, she is 73 years old now   GERD (gastroesophageal reflux disease)    Goals of care, counseling/discussion 12/22/2020   Hyperlipidemia    Hypertension    IHD (ischemic heart disease)    prior stenting of the LAD in December of 2005; with last cath in 2010 showing diffuse CAD with normal LV function. He is managed medically.    Malignant hyperthermia    sister had reaction concerning for malignant hyperthermia (~ 1988)   MDS (myelodysplastic syndrome), high grade ([place]) 12/22/2020   Neuromuscular disorder ([place]) 10/23/2017   lumbar 4 disc in back causing nerve pain, on gabapentin   Obesity    Obstructive sleep apnea on CPAP    PONV (postoperative nausea and vomiting)    S/P aortic valve replacement with bioprosthetic valve 10/26/2017   25 mm [facility] Ease bovine pericardial tissue valve   S/P coronary artery stent placement 2005   LAD    Past Surgical History:  Procedure Laterality Date   AORTIC VALVE REPLACEMENT N/A 10/26/2017   Procedure: AORTIC VALVE REPLACEMENT (AVR), using Magna Ease 25;  Surgeon: Rexene Alberts, MD;  Location: [place];  Service: Open Heart Surgery;  Laterality: N/A;   CARDIAC CATHETERIZATION  2010   NORMAL LV FUNCTION. DIFFUSE CORONARY DISEASE WITH THE DISTAL PORTION OF THE SMALL POSTERIOR DESCENDING VESSEL OCCLUDED AND FILLING WITH THE RIGHT COLLATERALS, 60-70% STENOSIS IN THE FIRST OBTUSE MARGINAL VESSEL AND DIFFUSE SMALL VESSEL DISEASE   CAROTID ENDARTERECTOMY     CIRCUMCISION  1985   CORONARY ARTERY BYPASS GRAFT N/A 10/26/2017   Procedure: CORONARY ARTERY BYPASS GRAFTING (CABG) x two , using left internal mammary artery and right leg greater saphenous vein harvested endoscopically;  Surgeon: Rexene Alberts, MD;  Location: [place];  Service: Open Heart Surgery;  Laterality: N/A;   CORONARY STENT PLACEMENT  2005   IR IMAGING GUIDED PORT INSERTION  12/28/2020   RIGHT/LEFT HEART CATH AND CORONARY ANGIOGRAPHY N/A 08/25/2017   Procedure: RIGHT/LEFT HEART CATH AND CORONARY ANGIOGRAPHY;  Surgeon: Nelva Bush, MD;  Location: [place] CV LAB;  Service: Cardiovascular;  Laterality: N/A;   rotator cuff surgery Right    TEE WITHOUT CARDIOVERSION N/A 10/26/2017   Procedure: TRANSESOPHAGEAL ECHOCARDIOGRAM (TEE);  Surgeon: Rexene Alberts, MD;  Location: [place];  Service: Open Heart Surgery;  Laterality: N/A;   TEE WITHOUT CARDIOVERSION N/A 05/01/2020   Procedure: TRANSESOPHAGEAL ECHOCARDIOGRAM (TEE);  Surgeon: Buford Dresser, MD;  Location: [facility] ENDOSCOPY;  Service: Cardiovascular;  Laterality: N/A;    Prior to Admission medications   Medication Sig Start Date End Date Taking? Authorizing Provider  acetaminophen (TYLENOL) 500 MG tablet Take 1,000 mg by mouth every 8 (eight) hours as needed for mild pain (or headaches).   Yes [provider]  amoxicillin (AMOXIL) 500 MG capsule Take 2,000 mg by mouth daily as needed (one hour prior to dental appointments).   Yes [provider]  cefTRIAXone (ROCEPHIN)  IVPB Inject 2 g into the vein daily. Indication:  endocarditis First Dose: No Last Day of Therapy:  08/20/2021 Labs - Once weekly:  CBC/D and BMP, Labs - Every other week:  ESR and CRP Method of administration: IV Push Method of administration may be changed at the discretion of home infusion pharmacist based upon assessment of the patient and/or caregiver's ability to self-administer the medication ordered. 07/14/21 08/21/21 Yes Annita Brod, MD  chlorhexidine (PERIDEX) 0.12 % solution Use as directed 15 mLs in the mouth or throat 2 (two) times daily. Patient taking differently: Use as directed 15 mLs in the mouth or throat 2 (two) times daily as needed (dry mouth or  throat). 01/25/21  Yes Volanda Napoleon, MD  Ensure Max Protein (ENSURE MAX PROTEIN) LIQD Take 330 mLs (11 oz total) by mouth 2 (two) times daily. Patient taking differently: Take 11 oz by mouth daily. 07/14/21  Yes Annita Brod, MD  famciclovir (FAMVIR) 500 MG tablet Take 1 tablet (500 mg total) by mouth daily. 07/03/21 10/01/21 Yes Bonnielee Haff, MD  fluconazole (DIFLUCAN) 100 MG tablet Take 1 tablet (100 mg total) by mouth daily. 07/03/21 10/01/21 Yes Bonnielee Haff, MD  fluticasone [facility]) 50 MCG/ACT nasal spray Place 2 sprays into both nostrils daily as needed for allergies.   Yes [provider]  gabapentin (NEURONTIN) 300 MG capsule Take 600 mg by mouth at bedtime.    Yes [provider]  loratadine (CLARITIN) 10 MG tablet Take 10 mg by mouth every morning.   Yes [provider]  metoprolol succinate (TOPROL-XL) 50 MG 24 hr tablet Take 50 mg by mouth at bedtime.   Yes [provider]  Multiple Vitamin (MULTIVITAMIN WITH MINERALS) TABS tablet Take 1 tablet by mouth every morning.   Yes [provider]  nitroGLYCERIN (NITROSTAT) 0.4 MG SL tablet Place 1 tablet (0.4 mg total) under the tongue every 5 (five) minutes as needed for chest pain. 07/14/21 10/12/21 Yes Annita Brod, MD  omeprazole (PRILOSEC) 20 MG capsule Take 20 mg by mouth at bedtime.   Yes [provider]  oxymetazoline (AFRIN) 0.05 % nasal spray Place 1 spray into both nostrils 2 (two) times daily. 07/03/21  Yes Bonnielee Haff, MD  polyethylene glycol (MIRALAX / GLYCOLAX) 17 g packet Take 17 g by mouth daily as needed for moderate constipation. 07/14/21  Yes Annita Brod, MD  rosuvastatin (CRESTOR) 10 MG tablet Take 10 mg by mouth at bedtime.   Yes [provider]  senna (SENOKOT) 8.6 MG TABS tablet Take 1 tablet (8.6 mg total) by mouth 2 (two) times daily. Patient taking differently: Take 1 tablet by mouth 2 (two) times daily as needed for mild  constipation. 07/03/21  Yes Bonnielee Haff, MD  sitaGLIPtin-metformin (JANUMET) 50-1000 MG tablet Take 1 tablet by mouth 2 (two) times daily with a meal.  12/19/16  Yes [provider]  sodium chloride (OCEAN) 0.65 % SOLN nasal spray Place 2 sprays into both nostrils every 2 (two) hours. 07/03/21  Yes Bonnielee Haff, MD  tranexamic acid (LYSTEDA) 650 MG TABS tablet Take 2 tablets (1,300 mg total) by mouth 2 (two) times daily. 07/19/21  Yes Volanda Napoleon, MD  cefTRIAXone 2 g in sodium chloride 0.9 % 100 mL Inject 2 g into the vein daily. Patient not taking: No sig reported 07/15/21   Annita Brod, MD  mupirocin ointment (BACTROBAN) 2 % Place into the nose 2 (two) times daily. Patient not taking: Reported  on 07/24/2021 07/03/21   Bonnielee Haff, MD  [facility] VERIO test strip 1 each by Other route daily. 12/29/17   [provider]  prochlorperazine (COMPAZINE) 10 MG tablet Take 1 tablet (10 mg total) by mouth every 6 (six) hours as needed (Nausea or vomiting). 01/25/21 07/22/21  Volanda Napoleon, MD    Current Facility-Administered Medications  Medication Dose Route Frequency Provider Last Rate Last Admin   0.9 %  sodium chloride infusion (Manually program via Guardrails IV Fluids)   Intravenous Once Nolberto Hanlon, MD       0.9 %  sodium chloride infusion   Intravenous Continuous Nolberto Hanlon, MD       acetaminophen (TYLENOL) tablet 650 mg  650 mg Oral Q6H PRN Nolberto Hanlon, MD       Or   acetaminophen (TYLENOL) suppository 650 mg  650 mg Rectal Q6H PRN Nolberto Hanlon, MD       ceFEPIme (MAXIPIME) 2 g in sodium chloride 0.9 % 100 mL IVPB  2 g Intravenous Q8H Godfrey Pick, MD       chlorhexidine (PERIDEX) 0.12 % solution 15 mL  15 mL Mouth/Throat BID Nolberto Hanlon, MD       Chlorhexidine Gluconate Cloth 2 % PADS 6 each  6 each Topical Daily Nolberto Hanlon, MD       fluconazole (DIFLUCAN) tablet 100 mg  100 mg Oral Daily Nolberto Hanlon, MD       fluticasone (FLONASE) 50 MCG/ACT nasal spray 2  spray  2 spray Each Nare Daily PRN Nolberto Hanlon, MD       gabapentin (NEURONTIN) capsule 600 mg  600 mg Oral QHS Nolberto Hanlon, MD       insulin aspart (novoLOG) injection 0-15 Units  0-15 Units Subcutaneous Q4H Nolberto Hanlon, MD       loratadine (CLARITIN) tablet 10 mg  10 mg Oral q morning Nolberto Hanlon, MD   10 mg at 07/24/21 1126   multivitamin with minerals tablet 1 tablet  1 tablet Oral q morning Nolberto Hanlon, MD   1 tablet at 07/24/21 1126   mupirocin ointment (BACTROBAN) 2 %   Nasal BID Nolberto Hanlon, MD       oxymetazoline (AFRIN) 0.05 % nasal spray 1 spray  1 spray Each Nare BID Nolberto Hanlon, MD   1 spray at 07/24/21 1126   pantoprazole (PROTONIX) 80 mg /NS 100 mL IVPB  80 mg Intravenous Once Nolberto Hanlon, MD       pantoprazole (PROTONIX) injection 40 mg  40 mg Intravenous Once Godfrey Pick, MD       Derrill Memo ON 07/27/2021] pantoprazole (PROTONIX) injection 40 mg  40 mg Intravenous Q12H Nolberto Hanlon, MD       pantoprozole (PROTONIX) 80 mg /NS 100 mL infusion  8 mg/hr Intravenous Continuous Nolberto Hanlon, MD       protein supplement (ENSURE MAX) liquid  11 oz Oral BID Nolberto Hanlon, MD       rosuvastatin (CRESTOR) tablet 10 mg  10 mg Oral QHS Nolberto Hanlon, MD       sodium chloride (OCEAN) 0.65 % nasal spray 2 spray  2 spray Each Nare Q2H Amery, Sahar, MD       sodium chloride flush (NS) 0.9 % injection 10-40 mL  10-40 mL Intracatheter Q12H Amery, Sahar, MD       sodium chloride flush (NS) 0.9 % injection 10-40 mL  10-40 mL Intracatheter PRN Nolberto Hanlon, MD       valACYclovir (VALTREX) tablet 1,000  mg  1,000 mg Oral Daily Nolberto Hanlon, MD       vancomycin (VANCOREADY) IVPB 1750 mg/350 mL  1,750 mg Intravenous Q24H Nolberto Hanlon, MD 175 mL/hr at 07/24/21 0908 1,750 mg at 07/24/21 0908   Current Outpatient Medications  Medication Sig Dispense Refill   acetaminophen (TYLENOL) 500 MG tablet Take 1,000 mg by mouth every 8 (eight) hours as needed for mild pain (or headaches).     amoxicillin (AMOXIL)  500 MG capsule Take 2,000 mg by mouth daily as needed (one hour prior to dental appointments).     cefTRIAXone (ROCEPHIN) IVPB Inject 2 g into the vein daily. Indication:  endocarditis First Dose: No Last Day of Therapy:  08/20/2021 Labs - Once weekly:  CBC/D and BMP, Labs - Every other week:  ESR and CRP Method of administration: IV Push Method of administration may be changed at the discretion of home infusion pharmacist based upon assessment of the patient and/or caregiver's ability to self-administer the medication ordered. 38 Units 0   chlorhexidine (PERIDEX) 0.12 % solution Use as directed 15 mLs in the mouth or throat 2 (two) times daily. (Patient taking differently: Use as directed 15 mLs in the mouth or throat 2 (two) times daily as needed (dry mouth or throat).) 450 mL 3   Ensure Max Protein (ENSURE MAX PROTEIN) LIQD Take 330 mLs (11 oz total) by mouth 2 (two) times daily. (Patient taking differently: Take 11 oz by mouth daily.)     famciclovir (FAMVIR) 500 MG tablet Take 1 tablet (500 mg total) by mouth daily. 30 tablet 2   fluconazole (DIFLUCAN) 100 MG tablet Take 1 tablet (100 mg total) by mouth daily. 30 tablet 2   fluticasone (FLONASE) 50 MCG/ACT nasal spray Place 2 sprays into both nostrils daily as needed for allergies.     gabapentin (NEURONTIN) 300 MG capsule Take 600 mg by mouth at bedtime.      loratadine (CLARITIN) 10 MG tablet Take 10 mg by mouth every morning.     metoprolol succinate (TOPROL-XL) 50 MG 24 hr tablet Take 50 mg by mouth at bedtime.     Multiple Vitamin (MULTIVITAMIN WITH MINERALS) TABS tablet Take 1 tablet by mouth every morning.     nitroGLYCERIN (NITROSTAT) 0.4 MG SL tablet Place 1 tablet (0.4 mg total) under the tongue every 5 (five) minutes as needed for chest pain. 25 tablet 6   omeprazole (PRILOSEC) 20 MG capsule Take 20 mg by mouth at bedtime.     oxymetazoline (AFRIN) 0.05 % nasal spray Place 1 spray into both nostrils 2 (two) times daily. 30 mL 0    polyethylene glycol (MIRALAX / GLYCOLAX) 17 g packet Take 17 g by mouth daily as needed for moderate constipation.     rosuvastatin (CRESTOR) 10 MG tablet Take 10 mg by mouth at bedtime.     senna (SENOKOT) 8.6 MG TABS tablet Take 1 tablet (8.6 mg total) by mouth 2 (two) times daily. (Patient taking differently: Take 1 tablet by mouth 2 (two) times daily as needed for mild constipation.) 120 tablet 0   sitaGLIPtin-metformin (JANUMET) 50-1000 MG tablet Take 1 tablet by mouth 2 (two) times daily with a meal.      sodium chloride (OCEAN) 0.65 % SOLN nasal spray Place 2 sprays into both nostrils every 2 (two) hours.  0   tranexamic acid (LYSTEDA) 650 MG TABS tablet Take 2 tablets (1,300 mg total) by mouth 2 (two) times daily. 120 tablet 1   cefTRIAXone 2  g in sodium chloride 0.9 % 100 mL Inject 2 g into the vein daily. (Patient not taking: No sig reported) 1 Bag 38   mupirocin ointment (BACTROBAN) 2 % Place into the nose 2 (two) times daily. (Patient not taking: Reported on 07/24/2021) 22 g 0   ONETOUCH VERIO test strip 1 each by Other route daily.  3    Allergies as of 07/24/2021 - Review Complete 07/24/2021  Allergen Reaction Noted   Oxycodone Nausea And Vomiting 10/27/2017    Family History  Problem Relation Age of Onset   Cancer Mother    Heart attack Father 24   CAD Brother     Social History   Socioeconomic History   Marital status: Married    Spouse name: Not on file   Number of children: 3   Years of education: Not on file   Highest education level: Not on file  Occupational History   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  Tobacco Use   Smoking status: Former    Packs/day: 1.00    Years: 20.00    Pack years: 20.00    Types: Cigarettes    Quit date: 12/12/1992    Years since quitting: 28.6   Smokeless tobacco: Never  Vaping Use   Vaping Use: Never used  Substance and Sexual Activity   Alcohol use: Yes    Alcohol/week: 1.0 standard drink    Types: 1 Cans of beer  per week   Drug use: No   Sexual activity: Not Currently  Other Topics Concern   Not on file  Social History Narrative   Not on file   Social Determinants of Health   Financial Resource Strain: Not on file  Food Insecurity: Not on file  Transportation Needs: Not on file  Physical Activity: Not on file  Stress: Not on file  Social Connections: Not on file  Intimate Partner Violence: Not on file    Review of Systems: Pertinent positive and negative review of systems were noted in the above HPI section.  All other review of systems was otherwise negative.   Physical Exam: Vital signs in last 24 hours: Temp:  [97.8 F (36.6 C)-98.7 F (37.1 C)] 97.9 F (36.6 C) (08/13 1100) Pulse Rate:  [96-104] 98 (08/13 1100) Resp:  [18-29] 19 (08/13 1100) BP: (90-107)/(51-71) 107/70 (08/13 1100) SpO2:  [92 %-100 %] 92 % (08/13 1100) Weight:  [89.8 kg] 89.8 kg (08/13 0348)   General:   Alert,  Well-developed, chronically ill-appearing older white male pleasant and cooperative in NAD Head:  Normocephalic and atraumatic. Eyes:  Sclera clear, no icterus.   Conjunctiva pale Ears:  Normal auditory acuity. Nose:  No deformity, discharge,  or lesions. Mouth:  No deformity or lesions.   Neck:  Supple; no masses or thyromegaly. Lungs: Scattered rhonchi right upper lung . Heart:  Regular rate and rhythm; systolic murmur Abdomen:  Soft,nontender, BS active,nonpalp mass or hsm.   Rectal: Not done, documented heme positive today Msk:  Symmetrical without gross deformities. . Pulses:  Normal pulses noted. Extremities:  Without clubbing or edema. Neurologic:  Alert and  oriented x4;  grossly normal neurologically. Skin:  Intact without significant lesions or rashes.. Psych:  Alert and cooperative. Normal mood and affect.  Intake/Output from previous day: No intake/output data recorded. Intake/Output this shift: No intake/output data recorded.  Lab Results: Recent Labs    07/24/21 0535  WBC  2.8*  HGB 5.7*  HCT 17.4*  PLT 19*   BMET Recent Labs  07/24/21 0535  NA 125*  K 4.1  CL 97*  CO2 18*  GLUCOSE 169*  BUN 31*  CREATININE 1.18  CALCIUM 8.3*   LFT Recent Labs    07/24/21 0535  PROT 6.8  ALBUMIN 2.9*  AST 28  ALT 17  ALKPHOS 65  BILITOT 1.0   PT/INR Recent Labs    07/24/21 0535  LABPROT 15.7*  INR 1.3*   Hepatitis Panel No results for input(s): HEPBSAG, HCVAB, HEPAIGM, HEPBIGM in the last 72 hours.   IMPRESSION:  #57  75 year old white male with severe myelodysplastic syndrome/high-grade undergoing chemotherapy with chronic pancytopenia over the past many months.  Admitted through the emergency room this morning with complaints of shortness of breath and had 1 episode of small-volume hemoptysis  Chest x-ray shows a right lower lobe pneumonia Patient meets SIRS criteria with elevated lactate, and tachycardia  Has been started on IV antibiotics COVID screen pending  #2 chronic severe anemia with baseline hemoglobin in the 6-7 range over the past months requiring intermittent transfusions and platelet transfusions. Hemoglobin today is not any lower than he has had intermittently over the past couple of months. He does have heme positive stool, says his stools have been dark ever since he started chemo  It is not clear that he is having an active GI bleed, he may have a component of chronic GI blood loss possibly related to chemotherapy.  #3 severe thrombocytopenia #4 history of Barrett's esophagus last EGD 2017 #5 pandiverticulosis last colonoscopy 05/2020 #6 status post bioprosthetic aortic valve replacement 7.  Adult onset diabetes mellitus 8.  Coronary artery disease status post remote stent 9.  Prior history of endocarditis  Plan; continue IV Protonix twice daily Full liquid diet Transfusions as per oncology suggested trying to get his hemoglobin in the 9-10 range. Receiving platelet transfusion currently Patient does not need any  urgent endoscopic evaluation .  If EGD to be entertained would need platelet count above 35,000,  We will follow and can consider EGD later in this admission once his cardiopulmonary status has been optimized.     Amy Esterwood PA-C 07/24/2021, 11:45 AM

## 2021-07-24 NOTE — ED Notes (Signed)
Patient moved to hospital bed for comfort 

## 2021-07-24 NOTE — ED Notes (Signed)
IV team RN at bedside for IV placement for Protonix gtt.

## 2021-07-24 NOTE — ED Triage Notes (Signed)
BIB EMS on 2L [place] for comfort, pt reports weakness since Tuesday after transfusion on Monday. Chest heaviness starting last night with SOB. 96% on RA. Hx of MDS Cx, port accessed upon arrival due to abx treatment for unknwn infection.

## 2021-07-24 NOTE — Sepsis Progress Note (Signed)
Secure chat sent to bedside RN asking if 3rd lactic was going to be done

## 2021-07-24 NOTE — H&P (Signed)
History and Physical    Theodore Jenkins MMN:[id number] DOB: [birth year] DOA: 07/24/2021  PCP: Lezlie Octave, PA-C  Patient coming from: home   Chief Complaint: weakness and sob  HPI: Theodore Jenkins is a 75 y.o. 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  Return back to the emergency room on 7/27 with complaints of chest heaviness as well as hematuria and bleeding from his urethra, now presents with weakness and sob.   Patient reports couple of days of weakness and shortness of breath.  He reports being diagnosed with pneumonia recently and was sent home with antibiotics.  Last night he woke up with coughing spells and shortness of breath describes as a concrete block sitting on his chest.  No chest pain.  He knew his blood work was low due to feeling weak and short of breath.  Denies any chills, fever, dizziness.  He does report coughing up some blood this AM during his coughing spell.  He also reports black color stool that is Mushy.  In the ER he was found with hemoglobin of 5.7.  COVID testing is pending.  Stool occult done by ER is positive.  ED Course: Afebrile, blood pressure 90/52, tachycardic with heart rate of 102, respiratory rate as high as 30.  Satting 95% on room air.  Was given bolus of fluid by ER.  Sodium 125, creatinine 1.18, BUN 31, LFTs normal, BNP 1, 085.3, troponin elevated to 70, WBC 2.8, hemoglobin 5.7, hematocrit 17.4, platelets 19. Cxr reviewed personally, opacity/denisity of RL.   Review of Systems: All systems reviewed and otherwise negative.    Past Medical History:   Diagnosis Date   Aortic stenosis    Echo (08/2017): Severe AS with mean gradient of 42 mmHg.   Barrett's esophagus    CAD (coronary artery disease) 06/06/2011   Remote stent to the LAD in 2005 // Last cath in 2010 showing diffuse CAD, small PD that is occluded and fills with left to right collaterals, 60-70% 1st OM, with normal EF. Managed medically // Myoview 3/18:  Normal perfusion. LVEF 55% with normal wall motion. This is a low risk study.   Complication of anesthesia    Diabetes mellitus    ED (erectile dysfunction)    Family history of adverse reaction to anesthesia 10/23/2017   sister had history of malignant hyperthermia when she was 26, she is 73 years old now   GERD (gastroesophageal reflux disease)    Goals of care, counseling/discussion 12/22/2020   Hyperlipidemia    Hypertension    IHD (ischemic heart disease)    prior stenting of the LAD in December of 2005; with last cath in 2010 showing diffuse CAD with normal LV function. He is managed medically.    Malignant hyperthermia    sister had reaction concerning for malignant hyperthermia (~ 1988)   MDS (myelodysplastic syndrome), high grade ([place]) 12/22/2020   Neuromuscular disorder ([place]) 10/23/2017   lumbar 4 disc in back causing nerve pain, on gabapentin   Obesity    Obstructive sleep apnea on CPAP    PONV (postoperative nausea and vomiting)    S/P aortic valve replacement with bioprosthetic valve 10/26/2017  25 mm [facility] Ease bovine pericardial tissue valve   S/P coronary artery stent placement 2005   LAD    Past Surgical History:  Procedure Laterality Date   AORTIC VALVE REPLACEMENT N/A 10/26/2017   Procedure: AORTIC VALVE REPLACEMENT (AVR), using Magna Ease 25;  Surgeon: Rexene Alberts, MD;  Location: [facility];  Service: Open Heart Surgery;  Laterality: N/A;   CARDIAC CATHETERIZATION  2010   NORMAL LV FUNCTION. DIFFUSE CORONARY DISEASE WITH THE DISTAL PORTION OF THE SMALL POSTERIOR DESCENDING VESSEL OCCLUDED  AND FILLING WITH THE RIGHT COLLATERALS, 60-70% STENOSIS IN THE FIRST OBTUSE MARGINAL VESSEL AND DIFFUSE SMALL VESSEL DISEASE   CAROTID ENDARTERECTOMY     CIRCUMCISION  1985   CORONARY ARTERY BYPASS GRAFT N/A 10/26/2017   Procedure: CORONARY ARTERY BYPASS GRAFTING (CABG) x two , using left internal mammary artery and right leg greater saphenous vein harvested endoscopically;  Surgeon: Rexene Alberts, MD;  Location: [facility];  Service: Open Heart Surgery;  Laterality: N/A;   CORONARY STENT PLACEMENT  2005   IR IMAGING GUIDED PORT INSERTION  12/28/2020   RIGHT/LEFT HEART CATH AND CORONARY ANGIOGRAPHY N/A 08/25/2017   Procedure: RIGHT/LEFT HEART CATH AND CORONARY ANGIOGRAPHY;  Surgeon: Nelva Bush, MD;  Location: [facility];  Service: Cardiovascular;  Laterality: N/A;   rotator cuff surgery Right    TEE WITHOUT CARDIOVERSION N/A 10/26/2017   Procedure: TRANSESOPHAGEAL ECHOCARDIOGRAM (TEE);  Surgeon: Rexene Alberts, MD;  Location: [facility];  Service: Open Heart Surgery;  Laterality: N/A;   TEE WITHOUT CARDIOVERSION N/A 05/01/2020   Procedure: TRANSESOPHAGEAL ECHOCARDIOGRAM (TEE);  Surgeon: Buford Dresser, MD;  [facility];  Service: Cardiovascular;  Laterality: N/A;     reports that he quit smoking about 28 years ago. His smoking use included cigarettes. He has a 20.00 pack-year smoking history. He has never used smokeless tobacco. He reports current alcohol use of about 1.0 standard drink per week. He reports that he does not use drugs.  Allergies  Allergen Reactions   Oxycodone Nausea And Vomiting    Family History  Problem Relation Age of Onset   Cancer Mother    Heart attack Father 64   CAD Brother      Prior to Admission medications   Medication Sig Start Date End Date Taking? Authorizing Provider  acetaminophen (TYLENOL) 500 MG tablet Take 1,000 mg by mouth every 8 (eight) hours as needed for mild pain (or headaches).    [provider]   amoxicillin (AMOXIL) 500 MG capsule Take 2,000 mg by mouth See admin instructions. Take 2,000 mg by mouth one hour prior to dental appointments    [provider]  cefTRIAXone (ROCEPHIN) IVPB Inject 2 g into the vein daily. Indication:  endocarditis First Dose: No Last Day of Therapy:  08/20/2021 Labs - Once weekly:  CBC/D and BMP, Labs - Every other week:  ESR and CRP Method of administration: IV Push Method of administration may be changed at the discretion of home infusion pharmacist based upon assessment of the patient and/or caregiver's ability to self-administer the medication ordered. 07/14/21 08/21/21  Annita Brod, MD  cefTRIAXone 2 g in sodium chloride 0.9 % 100 mL Inject 2 g into the vein daily. 07/15/21   Annita Brod, MD  chlorhexidine (PERIDEX) 0.12 % solution Use as directed 15 mLs in the mouth or throat 2 (two) times daily. 01/25/21   Volanda Napoleon, MD  Ensure Max Protein (ENSURE MAX PROTEIN) LIQD Take 330 mLs (  11 oz total) by mouth 2 (two) times daily. 07/14/21   Annita Brod, MD  famciclovir (FAMVIR) 500 MG tablet Take 1 tablet (500 mg total) by mouth daily. 07/03/21 10/01/21  Bonnielee Haff, MD  fluconazole (DIFLUCAN) 100 MG tablet Take 1 tablet (100 mg total) by mouth daily. 07/03/21 10/01/21  Bonnielee Haff, MD  fluticasone (FLONASE) 50 MCG/ACT nasal spray Place 2 sprays into both nostrils daily as needed for allergies.    [provider]  gabapentin (NEURONTIN) 300 MG capsule Take 600 mg by mouth at bedtime.     [provider]  loratadine (CLARITIN) 10 MG tablet Take 10 mg by mouth every morning.    [provider]  metoprolol succinate (TOPROL-XL) 50 MG 24 hr tablet Take 50 mg by mouth at bedtime.    [provider]  Multiple Vitamin (MULTIVITAMIN WITH MINERALS) TABS tablet Take 1 tablet by mouth every morning.    [provider]  mupirocin ointment (BACTROBAN) 2 % Place into the nose 2 (two) times daily.  07/03/21   Bonnielee Haff, MD  nitroGLYCERIN (NITROSTAT) 0.4 MG SL tablet Place 1 tablet (0.4 mg total) under the tongue every 5 (five) minutes as needed for chest pain. 07/14/21 10/12/21  Annita Brod, MD  omeprazole (PRILOSEC) 20 MG capsule Take 20 mg by mouth daily at 6 PM.    [provider]  [facility] VERIO test strip 1 each by Other route daily. 12/29/17   [provider]  oxymetazoline (AFRIN) 0.05 % nasal spray Place 1 spray into both nostrils 2 (two) times daily. 07/03/21   Bonnielee Haff, MD  polyethylene glycol (MIRALAX / GLYCOLAX) 17 g packet Take 17 g by mouth daily as needed for moderate constipation. 07/14/21   Annita Brod, MD  rosuvastatin (CRESTOR) 10 MG tablet Take 10 mg by mouth at bedtime.    [provider]  senna (SENOKOT) 8.6 MG TABS tablet Take 1 tablet (8.6 mg total) by mouth 2 (two) times daily. 07/03/21   Bonnielee Haff, MD  sitaGLIPtin-metformin (JANUMET) 50-1000 MG tablet Take 1 tablet by mouth 2 (two) times daily with a meal.  12/19/16   [provider]  sodium chloride (OCEAN) 0.65 % SOLN nasal spray Place 2 sprays into both nostrils every 2 (two) hours. 07/03/21   Bonnielee Haff, MD  tranexamic acid (LYSTEDA) 650 MG TABS tablet Take 2 tablets (1,300 mg total) by mouth 2 (two) times daily. 07/19/21   Volanda Napoleon, MD  prochlorperazine (COMPAZINE) 10 MG tablet Take 1 tablet (10 mg total) by mouth every 6 (six) hours as needed (Nausea or vomiting). 01/25/21 07/22/21  Volanda Napoleon, MD    Physical Exam: Vitals:   07/24/21 0807 07/24/21 0830 07/24/21 0901 07/24/21 0915  BP: (!) 92/54 (!) 100/58 (!) 90/51 (!) 90/52  Pulse: 97 (!) 102 (!) 102 100  Resp: (!) 24 (!) 23 20 18   Temp:  98.7 F (37.1 C) 97.8 F (36.6 C) 97.9 F (36.6 C)  TempSrc:  Oral Oral Oral  SpO2: 98% 99%  95%  Weight:      Height:        Constitutional: NAD, calm, comfortable Vitals:   07/24/21 0807 07/24/21 0830 07/24/21 0901 07/24/21 0915  BP: (!)  92/54 (!) 100/58 (!) 90/51 (!) 90/52  Pulse: 97 (!) 102 (!) 102 100  Resp: (!) 24 (!) 23 20 18   Temp:  98.7 F (37.1 C) 97.8 F (36.6 C) 97.9 F (36.6 C)  TempSrc:  Oral Oral Oral  SpO2: 98% 99%  95%  Weight:      Height:       Eyes: PERRL, lids and conjunctivae pale ENMT: Mucous membranes are dry  Neck: normal, supple, no masses Respiratory: clear to auscultation bilaterally, no wheezing, no crackles. Normal respiratory effort. No accessory muscle use.  Cardiovascular: Regular rate and rhythm, no murmurs / rubs / gallops. No extremity edema. 2+ pedal pulses.  Abdomen: no tenderness, no masses palpated. No Bowel sounds positive.  Musculoskeletal: no clubbing / cyanosis.  Skin: color pale Neurologic: CN 2-12 grossly intact.  Psychiatric: Normal judgment and insight. Alert and oriented x 3. Normal mood.    Labs on Admission: I have personally reviewed following labs and imaging studies  CBC: Recent Labs  Lab 07/19/21 1058 07/24/21 0535  WBC 2.4* 2.8*  NEUTROABS 1.4* 1.4*  HGB 6.3* 5.7*  HCT 19.3* 17.4*  MCV 93.7 96.1  PLT 18* 19*   Basic Metabolic Panel: Recent Labs  Lab 07/19/21 1058 07/24/21 0535  NA 126* 125*  K 4.0 4.1  CL 95* 97*  CO2 22 18*  GLUCOSE 115* 169*  BUN 20 31*  CREATININE 1.00 1.18  CALCIUM 9.2 8.3*   GFR: Estimated Creatinine Clearance: 62.7 mL/min (by C-G formula based on SCr of 1.18 mg/dL). Liver Function Tests: Recent Labs  Lab 07/19/21 1058 07/24/21 0535  AST 21 28  ALT 18 17  ALKPHOS 67 65  BILITOT 0.6 1.0  PROT 7.1 6.8  ALBUMIN 3.5 2.9*   No results for input(s): LIPASE, AMYLASE in the last 168 hours. No results for input(s): AMMONIA in the last 168 hours. Coagulation Profile: Recent Labs  Lab 07/24/21 0535  INR 1.3*   Cardiac Enzymes: No results for input(s): CKTOTAL, CKMB, CKMBINDEX, TROPONINI in the last 168 hours. BNP (last 3 results) Recent Labs    12/09/20 1629  PROBNP 415*   HbA1C: No results for input(s):  HGBA1C in the last 72 hours. CBG: No results for input(s): GLUCAP in the last 168 hours. Lipid Profile: No results for input(s): CHOL, HDL, LDLCALC, TRIG, CHOLHDL, LDLDIRECT in the last 72 hours. Thyroid Function Tests: No results for input(s): TSH, T4TOTAL, FREET4, T3FREE, THYROIDAB in the last 72 hours. Anemia Panel: No results for input(s): VITAMINB12, FOLATE, FERRITIN, TIBC, IRON, RETICCTPCT in the last 72 hours. Urine analysis:    Component Value Date/Time   COLORURINE YELLOW 07/07/2021 1321   APPEARANCEUR HAZY (A) 07/07/2021 1321   LABSPEC 1.017 07/07/2021 1321   PHURINE 6.0 07/07/2021 1321   GLUCOSEU NEGATIVE 07/07/2021 1321   HGBUR LARGE (A) 07/07/2021 1321   BILIRUBINUR NEGATIVE 07/07/2021 1321   KETONESUR NEGATIVE 07/07/2021 1321   PROTEINUR 30 (A) 07/07/2021 1321   NITRITE NEGATIVE 07/07/2021 1321   LEUKOCYTESUR NEGATIVE 07/07/2021 1321    Radiological Exams on Admission: DG Chest Port 1 View  Result Date: 07/24/2021 CLINICAL DATA:  Questionable sepsis. EXAM: PORTABLE CHEST 1 VIEW COMPARISON:  06/07/2021 FINDINGS: Sternotomy wires overlie normal cardiac silhouette. Port in the anterior chest wall with tip in distal SVC. RIGHT lower lobe pneumonia again demonstrated. Increased in density of fluid over the horizontal fissure. IMPRESSION: Increase in density of RIGHT lower pneumonia with increased fluid in the horizontal fissure. Electronically Signed   By: Suzy Bouchard M.D.   On: 07/24/2021 05:29    EKG: reviewed personally, sinus tachycardia, LVH, nonspecific ST changes, ILBBB  Assessment/Plan Active Problems:   Sepsis ([place])   1.Sepsis-due to pneumonia Patient meets criteria for sepsis due  to increased lactate of 3.1, respiratory rate> 20, immunocompromise, tachycardic Chest x-ray with increase density of right lower lobe and increased fluid in the horizontal fissure.  Will continue cefepime and vancomycin started in the ER. Consult pharmacy for vancomycin  management/dosing  2. GIB- Patient found with hemoglobin of 5.7, melanotic stool.+stool occult in Er.  Er ordered 2 units PRBC, will continue.  IV PPI GI consulted-[place], Dr. Bryan Lemma will see pt today Monitor h/h   3.  MDS (myelodysplastic syndrome), high grade ([place]) with pancytopenia Per previous discharge summary oncology felt very little response to chemotherapy and recommendation to give patient Amicar mouth rinse and to discuss hospice care.  It appears palliative care home-based was consulted at time of discharge earlier this month.  Patient was made DNR. Oncology consulted today.  Oncology recommends transfuse 1 unit PLT due to GIB.   4.h/xo AVR with bioprosthetic valve H/xo bacteremia GM +.  TEE not obtained due to pancytopenia. Was sent home in August with Rocephin 2g daily with f/u as outpt for susceptibilities Bcx drawn here  May consider ID consult if bcx +    5.HTN- due to anemia and GIB, with bp on low side, will hold bp meds   6.  Elevated troponin-likely due to demand ischemia from GI bleed/sepsis  7.DM- on Janumet, will hold Ck fs RISS   DVT prophylaxis: scd  Code Status: dnr  Family Communication: wife at bedside  Disposition Plan: back to previous life  Consults called: oncology, GI Admission status: inpatient as patient requires more than 2 midnight stays due to severity of illness   Nolberto Hanlon MD [facility]   If 7PM-7AM, please contact night-coverage [email or web address] Password [facility]  07/24/2021, 9:18 AM

## 2021-07-24 NOTE — Progress Notes (Signed)
Pharmacy Antibiotic Note  Theodore Jenkins is a 75 y.o. male admitted on 07/24/2021 with sepsis  Scr 1.18  Height: '5\' 9"'$  (175.3 cm) Weight: 89.8 kg (198 lb) IBW/kg (Calculated) : 70.7  Temp (24hrs), Avg:98.7 F (37.1 C), Min:98.7 F (37.1 C), Max:98.7 F (37.1 C)  Recent Labs  Lab 07/19/21 1058 07/24/21 0535  WBC 2.4* 2.8*  CREATININE 1.00 1.18  LATICACIDVEN  --  3.1*    Estimated Creatinine Clearance: 62.7 mL/min (by C-G formula based on SCr of 1.18 mg/dL).    Allergies  Allergen Reactions   Oxycodone Nausea And Vomiting   Plan: Vanc 1750 q24h Cefepime 2 g q8h Monitor renal fx cx vanc lvls prn  Barth Kirks, PharmD, BCCCP Clinical Pharmacist [phone]  Please check AMION for all [place] numbers  07/24/2021 7:56 AM

## 2021-07-24 NOTE — ED Notes (Signed)
IV Team RN at bedside for IV placement.

## 2021-07-24 NOTE — Progress Notes (Signed)
[facility] following sepsis 

## 2021-07-24 NOTE — Consult Note (Signed)
Reason for the request:    Pancytopenia  HPI: I was asked by Dr. Kurtis Bushman  to evaluate Theodore Jenkins for the evaluation of myelodysplastic syndrome and pancytopenia.  He is a 75 year old man followed by Dr. Marin Olp with multiple comorbid conditions including coronary artery disease as well as myelodysplastic syndrome.  He is currently on decitabine.  He completed cycle 5 was 7 days on June 24, 2021.  He presented with symptoms of shortness of breath, difficulty breathing as well as hemoptysis.  In the emergency department he was found to have hemoglobin of 5.7 white cell count of 2.8 and a blood count of 19.  Hemoccult testing was positive for blood.  Chest x-ray showed possibility of pneumonia.  Clinically, he reports feeling fatigue but comfortable at rest.  He denies any hematochezia or melena.      Past Medical History:  Diagnosis Date   Aortic stenosis    Echo (08/2017): Severe AS with mean gradient of 42 mmHg.   Barrett's esophagus    CAD (coronary artery disease) 06/06/2011   Remote stent to the LAD in 2005 // Last cath in 2010 showing diffuse CAD, small PD that is occluded and fills with left to right collaterals, 60-70% 1st OM, with normal EF. Managed medically // Myoview 3/18:  Normal perfusion. LVEF 55% with normal wall motion. This is a low risk study.   Complication of anesthesia    Diabetes mellitus    ED (erectile dysfunction)    Family history of adverse reaction to anesthesia 10/23/2017   sister had history of malignant hyperthermia when she was 56, she is 73 years old now   GERD (gastroesophageal reflux disease)    Goals of care, counseling/discussion 12/22/2020   Hyperlipidemia    Hypertension    IHD (ischemic heart disease)    prior stenting of the LAD in December of 2005; with last cath in 2010 showing diffuse CAD with normal LV function. He is managed medically.    Malignant hyperthermia    sister had reaction concerning for malignant hyperthermia (~ 1988)   MDS  (myelodysplastic syndrome), high grade ([place]) 12/22/2020   Neuromuscular disorder ([place]) 10/23/2017   lumbar 4 disc in back causing nerve pain, on gabapentin   Obesity    Obstructive sleep apnea on CPAP    PONV (postoperative nausea and vomiting)    S/P aortic valve replacement with bioprosthetic valve 10/26/2017   25 mm [facility] Ease bovine pericardial tissue valve   S/P coronary artery stent placement 2005   LAD  :   Past Surgical History:  Procedure Laterality Date   AORTIC VALVE REPLACEMENT N/A 10/26/2017   Procedure: AORTIC VALVE REPLACEMENT (AVR), using Magna Ease 25;  Surgeon: Rexene Alberts, MD;  Location: [place];  Service: Open Heart Surgery;  Laterality: N/A;   CARDIAC CATHETERIZATION  2010   NORMAL LV FUNCTION. DIFFUSE CORONARY DISEASE WITH THE DISTAL PORTION OF THE SMALL POSTERIOR DESCENDING VESSEL OCCLUDED AND FILLING WITH THE RIGHT COLLATERALS, 60-70% STENOSIS IN THE FIRST OBTUSE MARGINAL VESSEL AND DIFFUSE SMALL VESSEL DISEASE   CAROTID ENDARTERECTOMY     CIRCUMCISION  1985   CORONARY ARTERY BYPASS GRAFT N/A 10/26/2017   Procedure: CORONARY ARTERY BYPASS GRAFTING (CABG) x two , using left internal mammary artery and right leg greater saphenous vein harvested endoscopically;  Surgeon: Rexene Alberts, MD;  Location: [place];  Service: Open Heart Surgery;  Laterality: N/A;   CORONARY STENT PLACEMENT  2005   IR IMAGING GUIDED PORT INSERTION  12/28/2020   RIGHT/LEFT HEART CATH AND CORONARY ANGIOGRAPHY N/A 08/25/2017   Procedure: RIGHT/LEFT HEART CATH AND CORONARY ANGIOGRAPHY;  Surgeon: Nelva Bush, MD;  Location: [place] [facility];  Service: Cardiovascular;  Laterality: N/A;   rotator cuff surgery Right    TEE WITHOUT CARDIOVERSION N/A 10/26/2017   Procedure: TRANSESOPHAGEAL ECHOCARDIOGRAM (TEE);  Surgeon: Rexene Alberts, MD;  Location: [place];  Service: Open Heart Surgery;  Laterality: N/A;   TEE WITHOUT CARDIOVERSION N/A 05/01/2020   Procedure: TRANSESOPHAGEAL  ECHOCARDIOGRAM (TEE);  Surgeon: Buford Dresser, MD;  Location: [facility] ENDOSCOPY;  Service: Cardiovascular;  Laterality: N/A;  :   Current Facility-Administered Medications:    0.9 %  sodium chloride infusion (Manually program via Guardrails IV Fluids), , Intravenous, Once, Kurtis Bushman, Sahar, MD   0.9 %  sodium chloride infusion, , Intravenous, Continuous, Kurtis Bushman, Sahar, MD   acetaminophen (TYLENOL) tablet 650 mg, 650 mg, Oral, Q6H PRN **OR** acetaminophen (TYLENOL) suppository 650 mg, 650 mg, Rectal, Q6H PRN, Kurtis Bushman, Sahar, MD   ceFEPIme (MAXIPIME) 2 g in sodium chloride 0.9 % 100 mL IVPB, 2 g, Intravenous, Q8H, Dixon, Ryan, MD   chlorhexidine (PERIDEX) 0.12 % solution 15 mL, 15 mL, Mouth/Throat, BID, Kurtis Bushman, Sahar, MD   Chlorhexidine Gluconate Cloth 2 % PADS 6 each, 6 each, Topical, Daily, Kurtis Bushman, Sahar, MD   fluconazole (DIFLUCAN) tablet 100 mg, 100 mg, Oral, Daily, Kurtis Bushman, Sahar, MD   fluticasone (FLONASE) 50 MCG/ACT nasal spray 2 spray, 2 spray, Each Nare, Daily PRN, Nolberto Hanlon, MD   gabapentin (NEURONTIN) capsule 600 mg, 600 mg, Oral, QHS, Amery, Sahar, MD   loratadine (CLARITIN) tablet 10 mg, 10 mg, Oral, q morning, Kurtis Bushman, Sahar, MD   multivitamin with minerals tablet 1 tablet, 1 tablet, Oral, q morning, Amery, Sahar, MD   mupirocin ointment (BACTROBAN) 2 %, , Nasal, BID, Kurtis Bushman, Sahar, MD   oxymetazoline (AFRIN) 0.05 % nasal spray 1 spray, 1 spray, Each Nare, BID, Kurtis Bushman, Sahar, MD   pantoprazole (PROTONIX) injection 40 mg, 40 mg, Intravenous, Once, Godfrey Pick, MD   protein supplement (ENSURE MAX) liquid, 11 oz, Oral, BID, Kurtis Bushman, Sahar, MD   rosuvastatin (CRESTOR) tablet 10 mg, 10 mg, Oral, QHS, Amery, Sahar, MD   sodium chloride (OCEAN) 0.65 % nasal spray 2 spray, 2 spray, Each Nare, Q2H, Amery, Sahar, MD   sodium chloride flush (NS) 0.9 % injection 10-40 mL, 10-40 mL, Intracatheter, Q12H, Amery, Sahar, MD   sodium chloride flush (NS) 0.9 % injection 10-40 mL, 10-40 mL, Intracatheter, PRN,  Kurtis Bushman, Sahar, MD   valACYclovir (VALTREX) tablet 1,000 mg, 1,000 mg, Oral, Daily, Amery, Sahar, MD   vancomycin (VANCOREADY) IVPB 1750 mg/350 mL, 1,750 mg, Intravenous, Q24H, Amery, Sahar, MD, Last Rate: 175 mL/hr at 07/24/21 0908, 1,750 mg at 07/24/21 0908  Current Outpatient Medications:    acetaminophen (TYLENOL) 500 MG tablet, Take 1,000 mg by mouth every 8 (eight) hours as needed for mild pain (or headaches)., Disp: , Rfl:    amoxicillin (AMOXIL) 500 MG capsule, Take 2,000 mg by mouth See admin instructions. Take 2,000 mg by mouth one hour prior to dental appointments, Disp: , Rfl:    cefTRIAXone (ROCEPHIN) IVPB, Inject 2 g into the vein daily. Indication:  endocarditis First Dose: No Last Day of Therapy:  08/20/2021 Labs - Once weekly:  CBC/D and BMP, Labs - Every other week:  ESR and CRP Method of administration: IV Push Method of administration may be changed at the discretion of home infusion pharmacist based upon assessment of the  patient and/or caregiver's ability to self-administer the medication ordered., Disp: 38 Units, Rfl: 0   cefTRIAXone 2 g in sodium chloride 0.9 % 100 mL, Inject 2 g into the vein daily., Disp: 1 Bag, Rfl: 38   chlorhexidine (PERIDEX) 0.12 % solution, Use as directed 15 mLs in the mouth or throat 2 (two) times daily., Disp: 450 mL, Rfl: 3   Ensure Max Protein (ENSURE MAX PROTEIN) LIQD, Take 330 mLs (11 oz total) by mouth 2 (two) times daily., Disp: , Rfl:    famciclovir (FAMVIR) 500 MG tablet, Take 1 tablet (500 mg total) by mouth daily., Disp: 30 tablet, Rfl: 2   fluconazole (DIFLUCAN) 100 MG tablet, Take 1 tablet (100 mg total) by mouth daily., Disp: 30 tablet, Rfl: 2   fluticasone (FLONASE) 50 MCG/ACT nasal spray, Place 2 sprays into both nostrils daily as needed for allergies., Disp: , Rfl:    gabapentin (NEURONTIN) 300 MG capsule, Take 600 mg by mouth at bedtime. , Disp: , Rfl:    loratadine (CLARITIN) 10 MG tablet, Take 10 mg by mouth every morning., Disp: , Rfl:     metoprolol succinate (TOPROL-XL) 50 MG 24 hr tablet, Take 50 mg by mouth at bedtime., Disp: , Rfl:    Multiple Vitamin (MULTIVITAMIN WITH MINERALS) TABS tablet, Take 1 tablet by mouth every morning., Disp: , Rfl:    mupirocin ointment (BACTROBAN) 2 %, Place into the nose 2 (two) times daily., Disp: 22 g, Rfl: 0   nitroGLYCERIN (NITROSTAT) 0.4 MG SL tablet, Place 1 tablet (0.4 mg total) under the tongue every 5 (five) minutes as needed for chest pain., Disp: 25 tablet, Rfl: 6   omeprazole (PRILOSEC) 20 MG capsule, Take 20 mg by mouth daily at 6 PM., Disp: , Rfl:    ONETOUCH VERIO test strip, 1 each by Other route daily., Disp: , Rfl: 3   oxymetazoline (AFRIN) 0.05 % nasal spray, Place 1 spray into both nostrils 2 (two) times daily., Disp: 30 mL, Rfl: 0   polyethylene glycol (MIRALAX / GLYCOLAX) 17 g packet, Take 17 g by mouth daily as needed for moderate constipation., Disp: , Rfl:    rosuvastatin (CRESTOR) 10 MG tablet, Take 10 mg by mouth at bedtime., Disp: , Rfl:    senna (SENOKOT) 8.6 MG TABS tablet, Take 1 tablet (8.6 mg total) by mouth 2 (two) times daily., Disp: 120 tablet, Rfl: 0   sitaGLIPtin-metformin (JANUMET) 50-1000 MG tablet, Take 1 tablet by mouth 2 (two) times daily with a meal. , Disp: , Rfl:    sodium chloride (OCEAN) 0.65 % SOLN nasal spray, Place 2 sprays into both nostrils every 2 (two) hours., Disp: , Rfl: 0   tranexamic acid (LYSTEDA) 650 MG TABS tablet, Take 2 tablets (1,300 mg total) by mouth 2 (two) times daily., Disp: 120 tablet, Rfl: 1:   Allergies  Allergen Reactions   Oxycodone Nausea And Vomiting  :   Family History  Problem Relation Age of Onset   Cancer Mother    Heart attack Father 46   CAD Brother   :   Social History   Socioeconomic History   Marital status: Married    Spouse name: Not on file   Number of children: 3   Years of education: Not on file   Highest education level: Not on file  Occupational History   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  Tobacco Use   Smoking status: Former    Packs/day: 1.00    Years: 20.00  Pack years: 20.00    Types: Cigarettes    Quit date: 12/12/1992    Years since quitting: 28.6   Smokeless tobacco: Never  Vaping Use   Vaping Use: Never used  Substance and Sexual Activity   Alcohol use: Yes    Alcohol/week: 1.0 standard drink    Types: 1 Cans of beer per week   Drug use: No   Sexual activity: Not Currently  Other Topics Concern   Not on file  Social History Narrative   Not on file   Social Determinants of Health   Financial Resource Strain: Not on file  Food Insecurity: Not on file  Transportation Needs: Not on file  Physical Activity: Not on file  Stress: Not on file  Social Connections: Not on file  Intimate Partner Violence: Not on file  :  Pertinent items are noted in HPI.  Exam: Blood pressure (!) 90/52, pulse 100, temperature 97.9 F (36.6 C), temperature source Oral, resp. rate 18, height 5' 9" (1.753 m), weight 198 lb (89.8 kg), SpO2 95 %. ECOG 2 General appearance: Pale appearing gentleman without distress.  Chronically ill-appearing. Head: atraumatic without any abnormalities. Eyes: conjunctivae/corneas clear. PERRL.  Sclera anicteric. Throat: lips, mucosa, and tongue normal; without oral thrush or ulcers. Resp: clear to auscultation bilaterally without rhonchi, wheezes or dullness to percussion. Cardio: regular rate and rhythm, S1, S2 normal, no murmur, click, rub or gallop GI: soft, non-tender; bowel sounds normal; no masses,  no organomegaly Skin: Skin color, texture, turgor normal. No rashes or lesions Lymph nodes: Cervical, supraclavicular, and axillary nodes normal. Neurologic: Grossly normal without any motor, sensory or deep tendon reflexes. Musculoskeletal: No joint deformity or effusion.   Recent Labs    07/24/21 0535  WBC 2.8*  HGB 5.7*  HCT 17.4*  PLT 19*    Recent Labs    07/24/21 0535  NA 125*  K 4.1  CL 97*  CO2  18*  GLUCOSE 169*  BUN 31*  CREATININE 1.18  CALCIUM 8.3*      Assessment and Plan:   75 year old with:  1.  Myelodysplastic syndrome currently on decitabine as well as supportive transfusion.  Last cycle of treatment given in July 2022.  Treatment certainly will be on hold till his acute illness is resolved.  2.  Anemia: Combination of myelodysplastic syndrome and possible GI bleeding.  I recommend supportive transfusion to get his hemoglobin close to 10 given his coronary disease.  Per the primary team.  3.  Thrombocytopenia: Related to MDS.  Given the Hemoccult positive testing.  I recommended transfusion of 1 unit of platelets to get his platelets close to 50 to decrease risk of bleeding.  Consider GI consult.  4.  Presumed sepsis and pneumonia: I agree with antibiotics and management per the primary team.  5.  Follow-up: We will continue to follow along during his hospitalization.  Dr. Marin Olp will see on Monday if he is still hospitalized.  80  minutes were dedicated to this visit.  50% of the time was face-to-face and the time was spent on reviewing laboratory data, imaging studies, and addressing complication related to his condition and future plan of care discussion.

## 2021-07-24 NOTE — ED Notes (Signed)
Oncologist at bedside. 

## 2021-07-24 NOTE — ED Provider Notes (Signed)
[facility] EMERGENCY DEPARTMENT Provider Note  CSN: [id number] Arrival date & time: 07/24/21 4827  Chief Complaint(s) Weakness and Shortness of Breath  HPI Theodore Jenkins is a 75 y.o. male with a past medical history listed below including MDS resistant to chemo who presents to the emergency department with 1 day of generalized fatigue and shortness of breath with associated chest pain.  Tonight the chest pain and shortness of breath awoke him from sleep.  Improved by sitting up.  Worsen when lying down.  Pain is described as pressure.  Nonradiating. Similar to prior presentation type II NSTEMI related to anemia requiring blood transfusion last month.  No known fevers.  No coughing or congestion.  No nausea or vomiting.  No abdominal pain.  Wife reports that he looks more pale than yesterday. No known bleeding.  Patient is currently being treated for bacteremia with IV Rocephin.  The history is provided by the patient and the spouse.  Shortness of Breath  Past Medical History Past Medical History:  Diagnosis Date   Aortic stenosis    Echo (08/2017): Severe AS with mean gradient of 42 mmHg.   Barrett's esophagus    CAD (coronary artery disease) 06/06/2011   Remote stent to the LAD in 2005 // Last cath in 2010 showing diffuse CAD, small PD that is occluded and fills with left to right collaterals, 60-70% 1st OM, with normal EF. Managed medically // Myoview 3/18:  Normal perfusion. LVEF 55% with normal wall motion. This is a low risk study.   Complication of anesthesia    Diabetes mellitus    ED (erectile dysfunction)    Family history of adverse reaction to anesthesia 10/23/2017   sister had history of malignant hyperthermia when she was 16, she is 73 years old now   GERD (gastroesophageal reflux disease)    Goals of care, counseling/discussion 12/22/2020   Hyperlipidemia    Hypertension    IHD (ischemic heart disease)    prior stenting of the LAD in December of 2005;  with last cath in 2010 showing diffuse CAD with normal LV function. He is managed medically.    Malignant hyperthermia    sister had reaction concerning for malignant hyperthermia (~ 1988)   MDS (myelodysplastic syndrome), high grade ([place]) 12/22/2020   Neuromuscular disorder ([place]) 10/23/2017   lumbar 4 disc in back causing nerve pain, on gabapentin   Obesity    Obstructive sleep apnea on CPAP    PONV (postoperative nausea and vomiting)    S/P aortic valve replacement with bioprosthetic valve 10/26/2017   25 mm [facility] Ease bovine pericardial tissue valve   S/P coronary artery stent placement 2005   LAD   Patient Active Problem List   Diagnosis Date Noted   MDS (myelodysplastic syndrome) (HCC)    Non-ST elevation (NSTEMI) myocardial infarction ([place])    Bacteremia due to Gram-positive bacteria    Chest pain 07/07/2021   Acute blood loss anemia (ABLA) 07/07/2021   Pressure injury of skin 06/29/2021   Thrombocytopenia ([place]) 06/28/2021   Stage 3 chronic kidney disease ([place]) 06/27/2021   MDS (myelodysplastic syndrome), high grade ([place]) 12/22/2020   Goals of care, counseling/discussion 12/22/2020   Pancytopenia ([place]) 12/10/2020   AKI (acute kidney injury) ([place]) 12/10/2020   Acute bacterial endocarditis    Hyponatremia 04/30/2020   Bacteremia 04/29/2020   S/P AVR (aortic valve replacement) 02/15/2018   S/P aortic valve replacement with bioprosthetic valve  10/26/2017   S/P CABG x 2 10/26/2017  Type 2 diabetes mellitus with complication, without long-term current use of insulin (HCC)    Accelerating angina ([place]) 08/18/2017   OSA on CPAP 01/06/2017   Carotid artery disease, unspecified laterality ([place]) 08/08/2016   Abdominal aortic aneurysm without rupture ([place]) 12/22/2015   Exertional chest pain 10/03/2014   Aortic valve stenosis 05/03/2014   ED (erectile dysfunction) 03/20/2012   Coronary artery disease involving native coronary artery of native heart without angina pectoris  06/06/2011   Hyperlipidemia LDL goal <70 06/06/2011   Essential hypertension 06/06/2011   Obesities, morbid ([place]) 06/06/2011   Home Medication(s) Prior to Admission medications   Medication Sig Start Date End Date Taking? Authorizing Provider  acetaminophen (TYLENOL) 500 MG tablet Take 1,000 mg by mouth every 8 (eight) hours as needed for mild pain (or headaches).    [provider]  amoxicillin (AMOXIL) 500 MG capsule Take 2,000 mg by mouth See admin instructions. Take 2,000 mg by mouth one hour prior to dental appointments    [provider]  cefTRIAXone (ROCEPHIN) IVPB Inject 2 g into the vein daily. Indication:  endocarditis First Dose: No Last Day of Therapy:  08/20/2021 Labs - Once weekly:  CBC/D and BMP, Labs - Every other week:  ESR and CRP Method of administration: IV Push Method of administration may be changed at the discretion of home infusion pharmacist based upon assessment of the patient and/or caregiver's ability to self-administer the medication ordered. 07/14/21 08/21/21  Annita Brod, MD  cefTRIAXone 2 g in sodium chloride 0.9 % 100 mL Inject 2 g into the vein daily. 07/15/21   Annita Brod, MD  chlorhexidine (PERIDEX) 0.12 % solution Use as directed 15 mLs in the mouth or throat 2 (two) times daily. 01/25/21   Volanda Napoleon, MD  Ensure Max Protein (ENSURE MAX PROTEIN) LIQD Take 330 mLs (11 oz total) by mouth 2 (two) times daily. 07/14/21   Annita Brod, MD  famciclovir (FAMVIR) 500 MG tablet Take 1 tablet (500 mg total) by mouth daily. 07/03/21 10/01/21  Bonnielee Haff, MD  fluconazole (DIFLUCAN) 100 MG tablet Take 1 tablet (100 mg total) by mouth daily. 07/03/21 10/01/21  Bonnielee Haff, MD  fluticasone (FLONASE) 50 MCG/ACT nasal spray Place 2 sprays into both nostrils daily as needed for allergies.    [provider]  gabapentin (NEURONTIN) 300 MG capsule Take 600 mg by mouth at bedtime.     [provider]  loratadine  (CLARITIN) 10 MG tablet Take 10 mg by mouth every morning.    [provider]  metoprolol succinate (TOPROL-XL) 50 MG 24 hr tablet Take 50 mg by mouth at bedtime.    [provider]  Multiple Vitamin (MULTIVITAMIN WITH MINERALS) TABS tablet Take 1 tablet by mouth every morning.    [provider]  mupirocin ointment (BACTROBAN) 2 % Place into the nose 2 (two) times daily. 07/03/21   Bonnielee Haff, MD  nitroGLYCERIN (NITROSTAT) 0.4 MG SL tablet Place 1 tablet (0.4 mg total) under the tongue every 5 (five) minutes as needed for chest pain. 07/14/21 10/12/21  Annita Brod, MD  omeprazole (PRILOSEC) 20 MG capsule Take 20 mg by mouth daily at 6 PM.    [provider]  [facility] VERIO test strip 1 each by Other route daily. 12/29/17   [provider]  oxymetazoline (AFRIN) 0.05 % nasal spray Place 1 spray into both nostrils 2 (two) times daily. 07/03/21   Bonnielee Haff, MD  polyethylene glycol [facility] /  GLYCOLAX) 17 g packet Take 17 g by mouth daily as needed for moderate constipation. 07/14/21   Annita Brod, MD  rosuvastatin (CRESTOR) 10 MG tablet Take 10 mg by mouth at bedtime.    [provider]  senna (SENOKOT) 8.6 MG TABS tablet Take 1 tablet (8.6 mg total) by mouth 2 (two) times daily. 07/03/21   Bonnielee Haff, MD  sitaGLIPtin-metformin (JANUMET) 50-1000 MG tablet Take 1 tablet by mouth 2 (two) times daily with a meal.  12/19/16   [provider]  sodium chloride (OCEAN) 0.65 % SOLN nasal spray Place 2 sprays into both nostrils every 2 (two) hours. 07/03/21   Bonnielee Haff, MD  tranexamic acid (LYSTEDA) 650 MG TABS tablet Take 2 tablets (1,300 mg total) by mouth 2 (two) times daily. 07/19/21   Volanda Napoleon, MD  prochlorperazine (COMPAZINE) 10 MG tablet Take 1 tablet (10 mg total) by mouth every 6 (six) hours as needed (Nausea or vomiting). 01/25/21 07/22/21  Volanda Napoleon, MD                                                                                                                                     Past Surgical History Past Surgical History:  Procedure Laterality Date   AORTIC VALVE REPLACEMENT N/A 10/26/2017   Procedure: AORTIC VALVE REPLACEMENT (AVR), using Magna Ease 25;  Surgeon: Rexene Alberts, MD;  Location: [place];  Service: Open Heart Surgery;  Laterality: N/A;   CARDIAC CATHETERIZATION  2010   NORMAL LV FUNCTION. DIFFUSE CORONARY DISEASE WITH THE DISTAL PORTION OF THE SMALL POSTERIOR DESCENDING VESSEL OCCLUDED AND FILLING WITH THE RIGHT COLLATERALS, 60-70% STENOSIS IN THE FIRST OBTUSE MARGINAL VESSEL AND DIFFUSE SMALL VESSEL DISEASE   CAROTID ENDARTERECTOMY     CIRCUMCISION  1985   CORONARY ARTERY BYPASS GRAFT N/A 10/26/2017   Procedure: CORONARY ARTERY BYPASS GRAFTING (CABG) x two , using left internal mammary artery and right leg greater saphenous vein harvested endoscopically;  Surgeon: Rexene Alberts, MD;  Location: [facility];  Service: Open Heart Surgery;  Laterality: N/A;   CORONARY STENT PLACEMENT  2005   IR IMAGING GUIDED PORT INSERTION  12/28/2020   RIGHT/LEFT HEART CATH AND CORONARY ANGIOGRAPHY N/A 08/25/2017   Procedure: RIGHT/LEFT HEART CATH AND CORONARY ANGIOGRAPHY;  Surgeon: Nelva Bush, MD;  Location: [facility];  Service: Cardiovascular;  Laterality: N/A;   rotator cuff surgery Right    TEE WITHOUT CARDIOVERSION N/A 10/26/2017   Procedure: TRANSESOPHAGEAL ECHOCARDIOGRAM (TEE);  Surgeon: Rexene Alberts, MD;  Location: [facility];  Service: Open Heart Surgery;  Laterality: N/A;   TEE WITHOUT CARDIOVERSION N/A 05/01/2020   Procedure: TRANSESOPHAGEAL ECHOCARDIOGRAM (TEE);  Surgeon: Buford Dresser, MD;  Location: [facility] ENDOSCOPY;  Service: Cardiovascular;  Laterality: N/A;   Family History Family History  Problem Relation Age of Onset   Cancer Mother    Heart attack Father 64   CAD  Brother     Social History Social History   Tobacco Use   Smoking status: Former     Packs/day: 1.00    Years: 20.00    Pack years: 20.00    Types: Cigarettes    Quit date: 12/12/1992    Years since quitting: 28.6   Smokeless tobacco: Never  Vaping Use   Vaping Use: Never used  Substance Use Topics   Alcohol use: Yes    Alcohol/week: 1.0 standard drink    Types: 1 Cans of beer per week   Drug use: No   Allergies Oxycodone  Review of Systems Review of Systems  Respiratory:  Positive for shortness of breath.   All other systems are reviewed and are negative for acute change except as noted in the HPI  Physical Exam Vital Signs  I have reviewed the triage vital signs BP 101/61 (BP Location: Left Arm)   Pulse (!) 101   Temp 98.7 F (37.1 C) (Oral)   Resp (!) 24   Ht _0  (1.753 m)   Wt 89.8 kg   SpO2 96%   BMI 29.24 kg/m   Physical Exam Vitals reviewed.  Constitutional:      General: He is not in acute distress.    Appearance: He is well-developed. He is not diaphoretic.  HENT:     Head: Normocephalic and atraumatic.     Nose: Nose normal.  Eyes:     General: No scleral icterus.       Right eye: No discharge.        Left eye: No discharge.     Conjunctiva/sclera: Conjunctivae normal.     Pupils: Pupils are equal, round, and reactive to light.  Cardiovascular:     Rate and Rhythm: Normal rate and regular rhythm.     Heart sounds: No murmur heard.   No friction rub. No gallop.  Pulmonary:     Effort: Pulmonary effort is normal. No respiratory distress.     Breath sounds: Normal breath sounds. No stridor. No rales.  Abdominal:     General: There is no distension.     Palpations: Abdomen is soft.     Tenderness: There is no abdominal tenderness.  Musculoskeletal:        General: No tenderness.     Cervical back: Normal range of motion and neck supple.     Right lower leg: 1+ Pitting Edema present.     Left lower leg: 1+ Pitting Edema present.  Skin:    General: Skin is cool and dry.     Coloration: Skin is pale.     Findings: No erythema  or rash.  Neurological:     Mental Status: He is alert and oriented to person, place, and time.    ED Results and Treatments Labs (all labs ordered are listed, but only abnormal results are displayed) Labs Reviewed  LACTIC ACID, PLASMA - Abnormal; Notable for the following components:      Result Value   Lactic Acid, Venous 3.1 (*)    All other components within normal limits  COMPREHENSIVE METABOLIC PANEL - Abnormal; Notable for the following components:   Sodium 125 (*)    Chloride 97 (*)    CO2 18 (*)    Glucose, Bld 169 (*)    BUN 31 (*)    Calcium 8.3 (*)    Albumin 2.9 (*)    All other components within normal limits  PROTIME-INR - Abnormal; Notable for the following components:  Prothrombin Time 15.7 (*)    INR 1.3 (*)    All other components within normal limits  APTT - Abnormal; Notable for the following components:   aPTT 39 (*)    All other components within normal limits  TROPONIN I (HIGH SENSITIVITY) - Abnormal; Notable for the following components:   Troponin I (High Sensitivity) 55 (*)    All other components within normal limits  CULTURE, BLOOD (SINGLE)  CULTURE, BLOOD (SINGLE)  LACTIC ACID, PLASMA  CBC WITH DIFFERENTIAL/PLATELET  BRAIN NATRIURETIC PEPTIDE  I-STAT CHEM 8, ED  TYPE AND SCREEN  TROPONIN I (HIGH SENSITIVITY)                                                                                                                         EKG  EKG Interpretation  Date/Time:  Saturday July 24 2021 03:42:18 EDT Ventricular Rate:  102 PR Interval:  192 QRS Duration: 119 QT Interval:  357 QTC Calculation: 465 R Axis:   -26 Text Interpretation: Sinus tachycardia Incomplete left bundle branch block LVH with secondary repolarization abnormality Since last tracing rate faster Otherwise no significant change Confirmed by Addison Lank [phone]) on 07/24/2021 7:14:35 AM       Radiology DG Chest Port 1 View  Result Date: 07/24/2021 CLINICAL DATA:   Questionable sepsis. EXAM: PORTABLE CHEST 1 VIEW COMPARISON:  06/07/2021 FINDINGS: Sternotomy wires overlie normal cardiac silhouette. Port in the anterior chest wall with tip in distal SVC. RIGHT lower lobe pneumonia again demonstrated. Increased in density of fluid over the horizontal fissure. IMPRESSION: Increase in density of RIGHT lower pneumonia with increased fluid in the horizontal fissure. Electronically Signed   By: Suzy Bouchard M.D.   On: 07/24/2021 05:29    Pertinent labs & imaging results that were available during my care of the patient were reviewed by me and considered in my medical decision making (see MDM for details).  Medications Ordered in ED Medications  sodium chloride flush (NS) 0.9 % injection 10-40 mL (has no administration in time range)  sodium chloride flush (NS) 0.9 % injection 10-40 mL (has no administration in time range)  Chlorhexidine Gluconate Cloth 2 % PADS 6 each (has no administration in time range)  vancomycin (VANCOREADY) IVPB 1500 mg/300 mL (has no administration in time range)  ceFEPIme (MAXIPIME) 2 g in sodium chloride 0.9 % 100 mL IVPB (has no administration in time range)  0.9 %  sodium chloride infusion (has no administration in time range)  Procedures .1-3 Lead EKG Interpretation  Date/Time: 07/24/2021 7:36 AM Performed by: Fatima Blank, MD Authorized by: Fatima Blank, MD     Interpretation: normal     ECG rate:  102   ECG rate assessment: tachycardic     Rhythm: sinus tachycardia     Ectopy: none     Conduction: normal   .Critical Care  Date/Time: 07/24/2021 7:37 AM Performed by: Fatima Blank, MD Authorized by: Fatima Blank, MD   Critical care provider statement:    Critical care time (minutes):  45   Critical care was necessary to treat or prevent imminent or  life-threatening deterioration of the following conditions:  Sepsis   Critical care was time spent personally by me on the following activities:  Discussions with consultants, evaluation of patient's response to treatment, examination of patient, ordering and performing treatments and interventions, ordering and review of laboratory studies, ordering and review of radiographic studies, pulse oximetry, re-evaluation of patient's condition, obtaining history from patient or surrogate and review of old charts  (including critical care time)  Medical Decision Making / ED Course I have reviewed the nursing notes for this encounter and the patient's prior records (if available in EHR or on provided paperwork).  RHODES CALVERT was evaluated in Emergency Department on 07/24/2021 for the symptoms described in the history of present illness. He was evaluated in the context of the global COVID-19 pandemic, which necessitated consideration that the patient might be at risk for infection with the SARS-CoV-2 virus that causes COVID-19. Institutional protocols and algorithms that pertain to the evaluation of patients at risk for COVID-19 are in a state of rapid change based on information released by regulatory bodies including the CDC and federal and state organizations. These policies and algorithms were followed during the patient's care in the ED.     Chest pain or shortness of breath similar to recent type II NSTEMI due to anemia. EKG without acute ischemic changes or evidence of pericarditis.  Pertinent labs & imaging results that were available during my care of the patient were reviewed by me and considered in my medical decision making:  Chest x-ray notable for worsening right lower lobe pneumonia.  Lactic 3.1. Code sepsis initiated and patient started on escalated antibiotics. Currently still pending CBC.  Patient care turned over to Dr Sharma Covert from[place] should be. Patient case and results discussed in detail;  please see their note for further ED managment.   Patient will require admission.  Final Clinical Impression(s) / ED Diagnoses Final diagnoses:  Sepsis without acute organ dysfunction, due to unspecified organism [facility])     This chart was dictated using voice recognition software.  Despite best efforts to proofread,  errors can occur which can change the documentation meaning.    Fatima Blank, MD 07/24/21 [phone]

## 2021-07-24 NOTE — ED Notes (Signed)
Provider at bedside

## 2021-07-24 NOTE — ED Provider Notes (Signed)
Care of patient assumed from Dr. Leonette Monarch at 7:30 AM.  This gentleman has a history of MDS and is on chemo.  Got transfusion on Monday.  Has felt weak since Tuesday.  He had history of symptomatic anemia requiring transfusion.  He is on Rocephin.  Presents with evidence of worsening right lower lobe pneumonia.  Lactic acid is elevated.  Broad-spectrum antibiotics and sepsis protocol ordered.  Awaiting CBC to determine severity of anemia.  Transfuse as needed.  Patient will need admission. Physical Exam  BP 102/67   Pulse (!) 104   Temp 98.7 F (37.1 C) (Oral)   Resp (!) 23   Ht '5\' 9"'$  (1.753 m)   Wt 89.8 kg   SpO2 97%   BMI 29.24 kg/m   Physical Exam Constitutional:      General: He is not in acute distress.    Appearance: He is ill-appearing (Chronically).  Abdominal:     Tenderness: There is guarding.     Comments: Melanotic stool present on DRE  Skin:    Coloration: Skin is pale.  Neurological:     General: No focal deficit present.     Mental Status: He is alert and oriented to person, place, and time.  Psychiatric:        Mood and Affect: Mood normal.        Behavior: Behavior normal.    ED Course/Procedures     Procedures  MDM  Patient's hemoglobin found to be 5.7.  2 units of irradiated PRBCs were ordered.  Patient also has baseline thrombocytopenia.  He is not aware of any active bleeding.  Patient was admitted for further management.  On reassessment, patient does endorse dark stools lately.  DRE was performed and findings concerning for melena.  Hemoccult card sent to lab.  Results of Hemoccult were positive.  IV Protonix ordered.  Admitting team notified.       Godfrey Pick, MD 07/24/21 [phone]

## 2021-07-24 NOTE — ED Notes (Signed)
Patient is resting comfortably. 

## 2021-07-24 NOTE — ED Notes (Signed)
Critical results called from lab hgb 5.7, plt 19. Dr Doren Custard attending notified of these results.

## 2021-07-25 DIAGNOSIS — R6520 Severe sepsis without septic shock: Secondary | ICD-10-CM

## 2021-07-25 DIAGNOSIS — J9600 Acute respiratory failure, unspecified whether with hypoxia or hypercapnia: Secondary | ICD-10-CM

## 2021-07-25 LAB — CBC
HCT: 19.8 % — ABNORMAL LOW (ref 39.0–52.0)
Hemoglobin: 6.8 g/dL — CL (ref 13.0–17.0)
MCH: 30.2 pg (ref 26.0–34.0)
MCHC: 34.3 g/dL (ref 30.0–36.0)
MCV: 88 fL (ref 80.0–100.0)
Platelets: 22 10*3/uL — CL (ref 150–400)
RBC: 2.25 MIL/uL — ABNORMAL LOW (ref 4.22–5.81)
RDW: 18.7 % — ABNORMAL HIGH (ref 11.5–15.5)
WBC: 3 10*3/uL — ABNORMAL LOW (ref 4.0–10.5)
nRBC: 7.3 % — ABNORMAL HIGH (ref 0.0–0.2)

## 2021-07-25 LAB — GLUCOSE, CAPILLARY
Glucose-Capillary: 147 mg/dL — ABNORMAL HIGH (ref 70–99)
Glucose-Capillary: 150 mg/dL — ABNORMAL HIGH (ref 70–99)
Glucose-Capillary: 160 mg/dL — ABNORMAL HIGH (ref 70–99)
Glucose-Capillary: 162 mg/dL — ABNORMAL HIGH (ref 70–99)
Glucose-Capillary: 163 mg/dL — ABNORMAL HIGH (ref 70–99)
Glucose-Capillary: 165 mg/dL — ABNORMAL HIGH (ref 70–99)
Glucose-Capillary: 175 mg/dL — ABNORMAL HIGH (ref 70–99)

## 2021-07-25 LAB — PREPARE RBC (CROSSMATCH)

## 2021-07-25 LAB — PROTIME-INR
INR: 1.3 — ABNORMAL HIGH (ref 0.8–1.2)
Prothrombin Time: 16 seconds — ABNORMAL HIGH (ref 11.4–15.2)

## 2021-07-25 LAB — HEMOGLOBIN AND HEMATOCRIT, BLOOD
HCT: 24.2 % — ABNORMAL LOW (ref 39.0–52.0)
Hemoglobin: 8.4 g/dL — ABNORMAL LOW (ref 13.0–17.0)

## 2021-07-25 LAB — BASIC METABOLIC PANEL
Anion gap: 9 (ref 5–15)
BUN: 31 mg/dL — ABNORMAL HIGH (ref 8–23)
CO2: 16 mmol/L — ABNORMAL LOW (ref 22–32)
Calcium: 7.7 mg/dL — ABNORMAL LOW (ref 8.9–10.3)
Chloride: 100 mmol/L (ref 98–111)
Creatinine, Ser: 0.99 mg/dL (ref 0.61–1.24)
GFR, Estimated: >60 mL/min (ref >60)
Glucose, Bld: 170 mg/dL — ABNORMAL HIGH (ref 70–99)
Potassium: 3.8 mmol/L (ref 3.5–5.1)
Sodium: 125 mmol/L — ABNORMAL LOW (ref 135–145)

## 2021-07-25 LAB — PROCALCITONIN: Procalcitonin: 0.11 ng/mL

## 2021-07-25 LAB — CORTISOL-AM, BLOOD: Cortisol - AM: 30.9 ug/dL — ABNORMAL HIGH (ref 6.7–22.6)

## 2021-07-25 LAB — PREPARE PLATELET PHERESIS: Unit division: 0

## 2021-07-25 LAB — BPAM PLATELET PHERESIS
Blood Product Expiration Date: 202208152359
ISSUE DATE / TIME: 202208131215
Unit Type and Rh: 5100

## 2021-07-25 MED ORDER — FUROSEMIDE 10 MG/ML IJ SOLN
20.0000 mg | Freq: Once | INTRAMUSCULAR | Status: AC
  Administered 2021-07-25: 20 mg via INTRAVENOUS
  Filled 2021-07-25: qty 2

## 2021-07-25 MED ORDER — SODIUM CHLORIDE 0.9% IV SOLUTION: Freq: Once | INTRAVENOUS | Status: AC

## 2021-07-25 MED ORDER — FUROSEMIDE 10 MG/ML IJ SOLN
20.0000 mg | INTRAMUSCULAR | Status: AC
  Administered 2021-07-25: 20 mg via INTRAVENOUS
  Filled 2021-07-25: qty 2

## 2021-07-25 NOTE — Progress Notes (Signed)
Events noted laboratory data reviewed.  After packed red cell and platelet transfusion his hemoglobin improved to 6.8 with platelet count of 22.  No clear-cut active bleeding noted at this time.  His clinical status remains stable.  I recommend continued supportive management as you are doing.  Repeat 1 unit of packed red cell to keep his hemoglobin close to 8 would be recommended.  Platelet transfusion would be recommended only if active bleeding is noted at this time.  We will continue to follow.

## 2021-07-25 NOTE — Progress Notes (Signed)
PROGRESS NOTE    Theodore Jenkins  [id number] DOB: [birth year] DOA: 07/24/2021 PCP: Lezlie Octave, PA-C   Brief Narrative:  Theodore Jenkins is a 75 y.o. 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  Return back to the emergency room on 7/27 with complaints of chest heaviness as well as hematuria and bleeding from his urethra, now presents with weakness and sob -found to have FOBT positive stool with tarry bowel movements over the past few weeks somewhat worsening over the past few days.  Concern for possible community-acquired pneumonia at intake as well, hospitalist called for admission.    Assessment & Plan:  Sepsis-due to pneumonia Lactic acidosis -Tachycardia, leukopenia, notable right lower lobe opacification on imaging  -Lactic acidosis downtrending with IV fluids and antibiotics appropriately  -Continue cefepime, vancomycin for 5-day course  Acute symptomatic anemia secondary to presumed upper GI bleed Chronic anemia of chronic disease (MDS) - Hemoglobin 5.7 on intake after 2 units only 6.8, 2 additional units transfused this morning, 4 units total since admission -Patient unfortunately transfusion dependent at baseline given MDS, wife indicates he has had more than 40 units transfusion since his diagnosis -GI consulted -holding off on endoscopy at this time likely due to high risk procedure given thrombocytopenia -Continue PPI drip -Patient carries history of Barrett's esophagus and gastric ulcers   MDS (myelodysplastic syndrome), high grade ([place]) with pancytopenia -Follows with Dr.  Marin Olp, appreciate insight and recommendations  -Per patient and wife patient has had very poor response to previous chemotherapy  -Palliative had previously discussed with the patient, he is currently DNR. -1 pooled platelets transfused at intake per oncology recommendations   AVR with bioprosthetic valve -Recent H/xo bacteremia GM + strep species (07/07/2021) -noted not to be Enterococcus, agalactiae, pyogenes or pneumoniae - TEE not obtained at that time due to pancytopenia. - Sent home in August with Rocephin 2g daily with f/u as outpt for susceptibilities -Currently on cefepime/vancomycin as above   HTN -Holding home medications given hypotension in the setting of above    Likely type II NSTEMI/demand ischemia with minimally elevated troponin -Secondary to above -Continue transfusions/fluids as indicated -Could resume home medications once blood pressure improves   Non-insulin-dependent diabetes type 2  -Continue to hold home medications including Janumet  -Hypoglycemic protocol, sliding scale insulin ongoing   DVT prophylaxis: scd only given likely acute bleed as above Code Status: dnr  Family Communication: wife at bedside   Status is: Inpatient  Dispo: The patient is from: Home              Anticipated d/c is to: To be determined              Anticipated d/c date is: 24 to 48 hours              Patient currently not medically stable for discharge  Consultants:  GI  Procedures:  None  Antimicrobials:  Cefepime, vancomycin  Subjective: No acute issues or events overnight, hemoglobin remains low despite transfusion, patient and wife clearly anxious about further prognosis, awaiting discussion with Dr. Marin Olp and his team as well as GI for more goals of care discussion and possible intervention  Objective: Vitals:   07/24/21 2345 07/25/21 0200 07/25/21 0300 07/25/21 0441  BP: 117/78 136/74 129/79 133/72  Pulse: (!) 110 (!) 118 (!) 102 (!) 104  Resp: 20 (!) '28  19 20  '$ Temp: 98.1 F (36.7 C) 97.9 F (36.6 C)  98.1 F (36.7 C)  TempSrc: Oral Oral  Oral  SpO2: 97% 92% 95% 96%  Weight:      Height:        Intake/Output Summary (Last 24 hours) at 07/25/2021 0705 Last data filed at 07/25/2021 0600 Gross per 24 hour  Intake 3756.56 ml  Output 315 ml  Net 3441.56 ml   Filed Weights   07/24/21 0348  Weight: 89.8 kg    Examination:  General:  Pleasantly resting in bed, No acute distress. HEENT:  Normocephalic atraumatic.  Sclerae nonicteric, noninjected.  Extraocular movements intact bilaterally. Neck:  Without mass or deformity.  Trachea is midline. Lungs: Right basilar rhonchi otherwise without wheeze or rales. Heart:  Regular rate and rhythm.  Without murmurs, rubs, or gallops. Abdomen:  Soft, nontender, nondistended.  Without guarding or rebound. Extremities: Without cyanosis, clubbing, edema, or obvious deformity. Vascular:  Dorsalis pedis and posterior tibial pulses palpable bilaterally. Skin:  Warm and dry, no erythema, no ulcerations.   Data Reviewed: I have personally reviewed following labs and imaging studies  CBC: Recent Labs  Lab 07/19/21 1058 07/24/21 0535 07/25/21 0242  WBC 2.4* 2.8* 3.0*  NEUTROABS 1.4* 1.4*  --   HGB 6.3* 5.7* 6.8*  HCT 19.3* 17.4* 19.8*  MCV 93.7 96.1 88.0  PLT 18* 19* 22*   Basic Metabolic Panel: Recent Labs  Lab 07/19/21 1058 07/24/21 0535 07/25/21 0242  NA 126* 125* 125*  K 4.0 4.1 3.8  CL 95* 97* 100  CO2 22 18* 16*  GLUCOSE 115* 169* 170*  BUN 20 31* 31*  CREATININE 1.00 1.18 0.99  CALCIUM 9.2 8.3* 7.7*   GFR: Estimated Creatinine Clearance: 74.7 mL/min (by C-G formula based on SCr of 0.99 mg/dL). Liver Function Tests: Recent Labs  Lab 07/19/21 1058 07/24/21 0535  AST 21 28  ALT 18 17  ALKPHOS 67 65  BILITOT 0.6 1.0  PROT 7.1 6.8  ALBUMIN 3.5 2.9*   No results for input(s): LIPASE, AMYLASE in the last 168 hours. No results for input(s): AMMONIA in the last 168  hours. Coagulation Profile: Recent Labs  Lab 07/24/21 0535 07/25/21 0242  INR 1.3* 1.3*   Cardiac Enzymes: No results for input(s): CKTOTAL, CKMB, CKMBINDEX, TROPONINI in the last 168 hours. BNP (last 3 results) Recent Labs    12/09/20 1629  PROBNP 415*   HbA1C: No results for input(s): HGBA1C in the last 72 hours. CBG: Recent Labs  Lab 07/24/21 1209 07/24/21 1608 07/24/21 2023 07/25/21 0003 07/25/21 0438  GLUCAP 178* 191* 169* 175* 162*   Lipid Profile: No results for input(s): CHOL, HDL, LDLCALC, TRIG, CHOLHDL, LDLDIRECT in the last 72 hours. Thyroid Function Tests: No results for input(s): TSH, T4TOTAL, FREET4, T3FREE, THYROIDAB in the last 72 hours. Anemia Panel: No results for input(s): VITAMINB12, FOLATE, FERRITIN, TIBC, IRON, RETICCTPCT in the last 72 hours. Sepsis Labs: Recent Labs  Lab 07/24/21 0535 07/24/21 0805 07/25/21 0242  PROCALCITON  --   --  0.11  LATICACIDVEN 3.1* 4.7*  --     Recent Results (from the past 240 hour(s))  SARS CORONAVIRUS 2 (TAT 6-24 HRS) Nasopharyngeal Nasopharyngeal Swab     Status: None   Collection Time: 07/24/21  8:45 AM  Specimen: Nasopharyngeal Swab  Result Value Ref Range Status   SARS Coronavirus 2 NEGATIVE NEGATIVE Final    Comment: (NOTE) SARS-CoV-2 target nucleic acids are NOT DETECTED.  The SARS-CoV-2 RNA is generally detectable in upper and lower respiratory specimens during the acute phase of infection. Negative results do not preclude SARS-CoV-2 infection, do not rule out co-infections with other pathogens, and should not be used as the sole basis for treatment or other patient management decisions. Negative results must be combined with clinical observations, patient history, and epidemiological information. The expected result is Negative.  Fact Sheet for Patients: SugarRoll.be  Fact Sheet for Healthcare Providers: https://www.woods-mathews.com/  This test is  not yet approved or cleared by the [place] FDA and  has been authorized for detection and/or diagnosis of SARS-CoV-2 by FDA under an Emergency Use Authorization (EUA). This EUA will remain  in effect (meaning this test can be used) for the duration of the COVID-19 declaration under Se ction 564(b)(1) of the Act, 21 U.S.C. section 360bbb-3(b)(1), unless the authorization is terminated or revoked sooner.  Performed at [facility], [place] [address], [place], [place] [address]   MRSA Next Gen by PCR, Nasal     Status: None   Collection Time: 07/24/21  5:59 PM   Specimen: Nasal Mucosa; Nasal Swab  Result Value Ref Range Status   MRSA by PCR Next Gen NOT DETECTED NOT DETECTED Final    Comment: (NOTE) The GeneXpert MRSA Assay (FDA approved for NASAL specimens only), is one component of a comprehensive MRSA colonization surveillance program. It is not intended to diagnose MRSA infection nor to guide or monitor treatment for MRSA infections. Test performance is not FDA approved in patients less than 33 years old. Performed at [facility], [place] [address], [place],  [address]          Radiology Studies: DG Chest Port 1 View  Result Date: 07/24/2021 CLINICAL DATA:  Questionable sepsis. EXAM: PORTABLE CHEST 1 VIEW COMPARISON:  06/07/2021 FINDINGS: Sternotomy wires overlie normal cardiac silhouette. Port in the anterior chest wall with tip in distal SVC. RIGHT lower lobe pneumonia again demonstrated. Increased in density of fluid over the horizontal fissure. IMPRESSION: Increase in density of RIGHT lower pneumonia with increased fluid in the horizontal fissure. Electronically Signed   By: Suzy Bouchard M.D.   On: 07/24/2021 05:29        Scheduled Meds:  chlorhexidine  15 mL Mouth/Throat BID   Chlorhexidine Gluconate Cloth  6 each Topical Daily   fluconazole  100 mg Oral Daily   gabapentin  600 mg Oral QHS   insulin aspart  0-15 Units Subcutaneous Q4H    loratadine  10 mg Oral q morning   multivitamin with minerals  1 tablet Oral q morning   oxymetazoline  1 spray Each Nare BID   [START ON 07/27/2021] pantoprazole  40 mg Intravenous Q12H   Ensure Max Protein  11 oz Oral BID   rosuvastatin  10 mg Oral QHS   sodium chloride  2 spray Each Nare Q2H   sodium chloride flush  10-40 mL Intracatheter Q12H   valACYclovir  1,000 mg Oral Daily   Continuous Infusions:  ceFEPime (MAXIPIME) IV 2 g (07/24/21 2311)   pantoprazole 8 mg/hr (07/24/21 2309)   vancomycin Stopped (07/24/21 1200)     LOS: 1 day   Time spent: 41mn  Janis Cuffe C Unique Sillas, DO [facility]  If 7PM-7AM, please contact night-coverage [email or web address]  07/25/2021, 7:05 AM

## 2021-07-25 NOTE — Progress Notes (Signed)
Pt SOB, ST HR 115, crackle on ausculation. 88% on 4L. MD paged. New orders in to d/c IV fluid and one time order for lasix.

## 2021-07-25 NOTE — Plan of Care (Signed)
  Problem: Education: Goal: Knowledge of General Education information will improve Description: Including pain rating scale, medication(s)/side effects and non-pharmacologic comfort measures Outcome: Progressing   Problem: Health Behavior/Discharge Planning: Goal: Ability to manage health-related needs will improve Outcome: Progressing   Problem: Nutrition: Goal: Adequate nutrition will be maintained Outcome: Progressing   Problem: Activity: Goal: Risk for activity intolerance will decrease Outcome: Progressing

## 2021-07-25 NOTE — Progress Notes (Signed)
   07/25/21 0200  Assess: MEWS Score  Temp 97.9 F (36.6 C)  BP 136/74  Pulse Rate (!) 118  ECG Heart Rate (!) 117  Resp (!) 28  Level of Consciousness Alert  SpO2 92 %  O2 Device Room Air  Patient Activity (if Appropriate) In bed  O2 Flow Rate (L/min) 4 L/min  Assess: MEWS Score  MEWS Temp 0  MEWS Systolic 0  MEWS Pulse 2  MEWS RR 2  MEWS LOC 0  MEWS Score 4  MEWS Score Color Red  Assess: if the MEWS score is Yellow or Red  Were vital signs taken at a resting state? Yes  Focused Assessment No change from prior assessment  Does the patient meet 2 or more of the SIRS criteria? No  Does the patient have a confirmed or suspected source of infection? No  Early Detection of Sepsis Score *See Row Information* Low  MEWS guidelines implemented *See Row Information* Yes  Treat  MEWS Interventions Escalated (See documentation below)  Pain Scale 0-10  Pain Score 0  Take Vital Signs  Increase Vital Sign Frequency  Red: Q 1hr X 4 then Q 4hr X 4, if remains red, continue Q 4hrs  Escalate  MEWS: Escalate Red: discuss with charge nurse/RN and provider, consider discussing with RRT  Notify: Charge Nurse/RN  Name of Charge Nurse/RN Notified Amina RN  Date Charge Nurse/RN Notified 07/25/21  Time Charge Nurse/RN Notified 0200  Notify: Provider  Provider Name/Title Irene Pap MD  Date Provider Notified 07/25/21  Time Provider Notified 0200  Notification Type Page  Notification Reason Change in status  Provider response See new orders  Date of Provider Response 07/25/21  Time of Provider Response 0205  Document  Patient Outcome Stabilized after interventions  Progress note created (see row info) Yes  Pt SOB, ST HR 115, crackle on ausculation. 88% on 4L. MD paged. New orders in to d/c IV fluid and one time order for lasix.

## 2021-07-26 ENCOUNTER — Inpatient Hospital Stay: Payer: PPO | Admitting: Hematology & Oncology

## 2021-07-26 ENCOUNTER — Encounter (HOSPITAL_COMMUNITY): Payer: Self-pay | Admitting: Internal Medicine

## 2021-07-26 ENCOUNTER — Inpatient Hospital Stay: Payer: PPO

## 2021-07-26 ENCOUNTER — Ambulatory Visit: Payer: PPO

## 2021-07-26 ENCOUNTER — Other Ambulatory Visit (HOSPITAL_COMMUNITY): Payer: Self-pay

## 2021-07-26 ENCOUNTER — Other Ambulatory Visit: Payer: PPO

## 2021-07-26 ENCOUNTER — Ambulatory Visit: Payer: PPO | Admitting: Hematology & Oncology

## 2021-07-26 DIAGNOSIS — D61818 Other pancytopenia: Secondary | ICD-10-CM | POA: Diagnosis not present

## 2021-07-26 DIAGNOSIS — D46Z Other myelodysplastic syndromes: Secondary | ICD-10-CM

## 2021-07-26 DIAGNOSIS — A419 Sepsis, unspecified organism: Secondary | ICD-10-CM | POA: Diagnosis not present

## 2021-07-26 DIAGNOSIS — D649 Anemia, unspecified: Secondary | ICD-10-CM | POA: Diagnosis not present

## 2021-07-26 LAB — HEMOGLOBIN AND HEMATOCRIT, BLOOD
HCT: 28.5 % — ABNORMAL LOW (ref 39.0–52.0)
Hemoglobin: 9.6 g/dL — ABNORMAL LOW (ref 13.0–17.0)

## 2021-07-26 LAB — BASIC METABOLIC PANEL
Anion gap: 6 (ref 5–15)
BUN: 22 mg/dL (ref 8–23)
CO2: 20 mmol/L — ABNORMAL LOW (ref 22–32)
Calcium: 7.6 mg/dL — ABNORMAL LOW (ref 8.9–10.3)
Chloride: 101 mmol/L (ref 98–111)
Creatinine, Ser: 0.89 mg/dL (ref 0.61–1.24)
GFR, Estimated: >60 mL/min (ref >60)
Glucose, Bld: 164 mg/dL — ABNORMAL HIGH (ref 70–99)
Potassium: 3.1 mmol/L — ABNORMAL LOW (ref 3.5–5.1)
Sodium: 127 mmol/L — ABNORMAL LOW (ref 135–145)

## 2021-07-26 LAB — GLUCOSE, CAPILLARY
Glucose-Capillary: 159 mg/dL — ABNORMAL HIGH (ref 70–99)
Glucose-Capillary: 177 mg/dL — ABNORMAL HIGH (ref 70–99)
Glucose-Capillary: 162 mg/dL — ABNORMAL HIGH (ref 70–99)
Glucose-Capillary: 209 mg/dL — ABNORMAL HIGH (ref 70–99)
Glucose-Capillary: 207 mg/dL — ABNORMAL HIGH (ref 70–99)

## 2021-07-26 LAB — PREPARE RBC (CROSSMATCH)

## 2021-07-26 LAB — CBC
HCT: 22.5 % — ABNORMAL LOW (ref 39.0–52.0)
Hemoglobin: 7.7 g/dL — ABNORMAL LOW (ref 13.0–17.0)
MCH: 30.3 pg (ref 26.0–34.0)
MCHC: 34.2 g/dL (ref 30.0–36.0)
MCV: 88.6 fL (ref 80.0–100.0)
Platelets: 10 10*3/uL — CL (ref 150–400)
RBC: 2.54 MIL/uL — ABNORMAL LOW (ref 4.22–5.81)
RDW: 17.2 % — ABNORMAL HIGH (ref 11.5–15.5)
WBC: 1.4 10*3/uL — CL (ref 4.0–10.5)
nRBC: 10.2 % — ABNORMAL HIGH (ref 0.0–0.2)

## 2021-07-26 MED ORDER — EPOETIN ALFA 40000 UNIT/ML IJ SOLN
40000.0000 [IU] | INTRAMUSCULAR | Status: DC
  Administered 2021-07-26 – 2021-07-28 (×2): 40000 [IU] via SUBCUTANEOUS
  Filled 2021-07-26 (×2): qty 1

## 2021-07-26 MED ORDER — POTASSIUM CHLORIDE CRYS ER 20 MEQ PO TBCR
40.0000 meq | EXTENDED_RELEASE_TABLET | Freq: Two times a day (BID) | ORAL | Status: AC
  Administered 2021-07-26 (×2): 40 meq via ORAL
  Filled 2021-07-26 (×2): qty 2

## 2021-07-26 MED ORDER — SODIUM CHLORIDE 0.9% IV SOLUTION: Freq: Once | INTRAVENOUS | Status: DC

## 2021-07-26 MED ORDER — AMINOCAPROIC ACID 500 MG PO TABS
1.0000 g | ORAL_TABLET | Freq: Three times a day (TID) | ORAL | Status: DC
  Administered 2021-07-26 – 2021-07-29 (×10): 1 g via ORAL
  Filled 2021-07-26 (×13): qty 2

## 2021-07-26 MED ORDER — GUAIFENESIN-DM 100-10 MG/5ML PO SYRP
5.0000 mL | ORAL_SOLUTION | ORAL | Status: DC | PRN
  Administered 2021-07-26: 5 mL via ORAL
  Filled 2021-07-26: qty 5

## 2021-07-26 NOTE — Progress Notes (Signed)
Patient woke up coughing, coughed up a silver dollar size amount of blood. Patient states that he has coughed up blood before. Dr. [place] Crane notified via [place]. Robitussin given for cough. Will continue to monitor.

## 2021-07-26 NOTE — Evaluation (Signed)
Physical Therapy Evaluation Patient Details Name: Theodore Jenkins MRN: [id number] DOB: October 01, 1946 Today's Date: 07/26/2021   History of Present Illness  Pt is a 75 y/o male admitted secondary to worsening weakness and SOB. Found to have anemia and workup pending. Per notes, possibly secondary to GI bleed. PMH includes myelodysplastic syndrome, CAD s/p CABG, DM, OSA on CPAP, and s/p AVR.  Clinical Impression  Pt admitted secondary to problem above with deficits below. Pt with increased fatigue and SOB which limited mobility tolerance. Was able to stand and transfer to chair using RW with min guard A for safety. HR elevated to low 130s. Oxygen sats WFL on 1.5L. Recommending HHPT at d/c to address current deficits. Will continue to follow acutely.     Follow Up Recommendations Home health PT    Equipment Recommendations  3in1 (PT)    Recommendations for Other Services       Precautions / Restrictions Precautions Precautions: Fall;Other (comment) Precaution Comments: Low platelets, WBC Restrictions Weight Bearing Restrictions: No      Mobility  Bed Mobility Overal bed mobility: Needs Assistance Bed Mobility: Supine to Sit     Supine to sit: Supervision     General bed mobility comments: Supervision for safety. Increased time required.    Transfers Overall transfer level: Needs assistance Equipment used: Rolling walker (2 wheeled) Transfers: Sit to/from [facility] Sit to Stand: Min guard Stand pivot transfers: Min guard       General transfer comment: Min guard for safety to stand and transfer to chair. HR elevating to low 130s and pt with increased SOB/fatigue. Further mobility deferred. Oxygen sats WFL on 1.5L oxygen  Ambulation/Gait                Stairs            Wheelchair Mobility    Modified Rankin (Stroke Patients Only)       Balance Overall balance assessment: Needs assistance Sitting-balance support: No upper extremity  supported;Feet supported Sitting balance-Leahy Scale: Good     Standing balance support: Bilateral upper extremity supported Standing balance-Leahy Scale: Poor Standing balance comment: Reliant on BUE support                             Pertinent Vitals/Pain Pain Assessment: No/denies pain    Home Living Family/patient expects to be discharged to:: Private residence Living Arrangements: Spouse/significant other Available Help at Discharge: Family;Available 24 hours/day Type of Home: House Home Access: Stairs to enter   [employer] of Steps: 1+1 at front without rails, 4-5 on back with railings Home Layout: Two level;Able to live on main level with bedroom/bathroom Home Equipment: Gilford Rile - 2 wheels;Cane - single point;Shower seat;Walker - 4 wheels;Wheelchair - manual;Grab bars - tub/shower;Grab bars - toilet      Prior Function Level of Independence: Needs assistance   Gait / Transfers Assistance Needed: Reports he has been using WC for mobility here lately. Can ambulate short distances using RW  ADL's / Homemaking Assistance Needed: Wife assists as needed.        Hand Dominance        Extremity/Trunk Assessment   Upper Extremity Assessment Upper Extremity Assessment: Defer to OT evaluation    Lower Extremity Assessment Lower Extremity Assessment: Generalized weakness    Cervical / Trunk Assessment Cervical / Trunk Assessment: Normal  Communication   Communication: No difficulties  Cognition Arousal/Alertness: Awake/alert Behavior During Therapy: WFL for tasks assessed/performed  Overall Cognitive Status: Within Functional Limits for tasks assessed                                        General Comments General comments (skin integrity, edema, etc.): Pt's wife present at beginning of session    Exercises     Assessment/Plan    PT Assessment Patient needs continued PT services  PT Problem List Decreased  strength;Decreased activity tolerance;Decreased balance;Decreased mobility;Decreased knowledge of use of DME;Cardiopulmonary status limiting activity       PT Treatment Interventions DME instruction;Gait training;Stair training;Functional mobility training;Therapeutic activities;Therapeutic exercise;Patient/family education;Balance training    PT Goals (Current goals can be found in the Care Plan section)  Acute Rehab PT Goals Patient Stated Goal: to go home PT Goal Formulation: With patient/family Time For Goal Achievement: 08/09/21 Potential to Achieve Goals: Good    Frequency Min 3X/week   Barriers to discharge        Co-evaluation               AM-PAC PT "6 Clicks" Mobility  Outcome Measure Help needed turning from your back to your side while in a flat bed without using bedrails?: None Help needed moving from lying on your back to sitting on the side of a flat bed without using bedrails?: None Help needed moving to and from a bed to a chair (including a wheelchair)?: A Little Help needed standing up from a chair using your arms (e.g., wheelchair or bedside chair)?: A Little Help needed to walk in hospital room?: A Little Help needed climbing 3-5 steps with a railing? : A Lot 6 Click Score: 19    End of Session Equipment Utilized During Treatment: Oxygen Activity Tolerance: Patient limited by fatigue Patient left: in chair;with call bell/phone within reach;with family/visitor present Nurse Communication: Mobility status PT Visit Diagnosis: Difficulty in walking, not elsewhere classified (R26.2);Muscle weakness (generalized) (M62.81)    Time: 1140-1155 PT Time Calculation (min) (ACUTE ONLY): 15 min   Charges:   PT Evaluation $PT Eval Moderate Complexity: 1 Mod          Reuel Derby, PT, DPT  Acute Rehabilitation Services  Pager: [phone] Office: [phone]   Rudean Hitt 07/26/2021, 1:05 PM

## 2021-07-26 NOTE — Progress Notes (Signed)
Received a call from bedside RN regarding abnormal laboratory results with WBC of 1.4, hemoglobin of 7.1, platelet count of 10.  Patient is followed by hematology/oncology, recommendation to maintain hemoglobin close to 8.  Due to mild hemoptysis overnight we will transfuse 1 unit of platelets and 1 unit of PRBCs.  CBC will be repeated posttransfusion per protocol.  We will continue to closely monitor and treat as indicated.

## 2021-07-26 NOTE — Progress Notes (Addendum)
PROGRESS NOTE    TALI ZIPPRICH  [id number] DOB: 03-07-1946 DOA: 07/24/2021 PCP: Lezlie Octave, PA-C   Brief Narrative:  Theodore Jenkins is a 75 y.o. 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  Return back to the emergency room on 7/27 with complaints of chest heaviness as well as hematuria and bleeding from his urethra, now presents with weakness and sob -found to have FOBT positive stool with tarry bowel movements over the past few weeks somewhat worsening over the past few days.  Concern for possible community-acquired pneumonia at intake as well, hospitalist called for admission.    Assessment & Plan:  Sepsis-due to pneumonia, resolving Acute hypoxic respiratory failure, resolving Lactic acidosis, resolving -Tachycardia, leukopenia, notable right lower lobe opacification on imaging  - Lactic acidosis downtrending with IV fluids and antibiotics appropriately  - Continue cefepime, vancomycin for 5-day course -Cannot rule out concurrent volume overload in the setting of numerous transfusions in the past few weeks as well as during hospitalization  Acute symptomatic anemia secondary to presumed upper GI bleed Chronic anemia of chronic disease (MDS) - Hemoglobin 5.7 on intake after 2 units only 6.8, 1 additional units transfused this morning, 5 units total since admission -Patient unfortunately transfusion dependent at baseline given MDS, wife indicates he has had more than 40 units transfusion since his diagnosis -GI consulted - holding off on endoscopy at this time likely due to high risk  procedure given thrombocytopenia -Continue PPI drip -Patient carries history of Barrett's esophagus and gastric ulcers   MDS (myelodysplastic syndrome), high grade ([place]) with pancytopenia -Follows with Dr. Marin Olp, appreciate insight and recommendations  -Per patient and wife patient has had very poor response to previous chemotherapy  -Palliative had previously discussed with the patient, he is currently DNR -we will ask palliative care to follow along while inpatient. -1 pool platelets ordered this morning, 2 pool thus far since admission - 1 u PRBC this am - 5 units total since admit  AVR with bioprosthetic valve -Recent H/xo bacteremia GM + strep species (07/07/2021) -noted not to be Enterococcus, agalactiae, pyogenes or pneumoniae - TEE not obtained at that time due to pancytopenia. - Sent home in August with Rocephin 2g daily with f/u as outpt for susceptibilities -Currently on cefepime/vancomycin as above   HTN -Holding home medications given hypotension in the setting of above    Likely type II NSTEMI/demand ischemia with minimally elevated troponin -Secondary to above -Continue transfusions/fluids as indicated -Could resume home medications once blood pressure improves   Non-insulin-dependent diabetes type 2  -Continue to hold home medications including Janumet  -Hypoglycemic protocol, sliding scale insulin ongoing   DVT prophylaxis: scd only given likely acute bleed as above Code Status: dnr  Family Communication: wife at bedside   Status is: Inpatient  Dispo: The patient is from: Home              Anticipated d/c is to: To be determined              Anticipated d/c date is: 24 to 48 hours              Patient currently not medically stable for  discharge  Consultants:  GI, oncology, palliative care  Procedures:  None  Antimicrobials:  Cefepime, vancomycin  Subjective: No acute issues or events overnight other than small episode of hemoptysis measuring  approximately less than 1 tablespoon.  Patient otherwise admits to ongoing fatigue weakness and dyspnea but denies chest pain nausea vomiting diarrhea constipation  Objective: Vitals:   07/25/21 2100 07/25/21 2200 07/25/21 2354 07/26/21 0403  BP: 131/75 126/76 132/75 134/77  Pulse: (!) 111 (!) 108 (!) 109 (!) 116  Resp: '16 18 20 20  '$ Temp:   98 F (36.7 C) 98.5 F (36.9 C)  TempSrc:   Oral Oral  SpO2: 98% 97% 97% 96%  Weight:      Height:        Intake/Output Summary (Last 24 hours) at 07/26/2021 0715 Last data filed at 07/26/2021 0202 Gross per 24 hour  Intake 1400 ml  Output 3100 ml  Net -1700 ml    Filed Weights   07/24/21 0348  Weight: 89.8 kg    Examination:  General:  Pleasantly resting in bed, No acute distress. HEENT:  Normocephalic atraumatic.  Sclerae nonicteric, noninjected.  Extraocular movements intact bilaterally. Neck:  Without mass or deformity.  Trachea is midline. Lungs: Scant right basilar rhonchi otherwise without wheeze or rales. Heart:  Regular rate and rhythm.  Without murmurs, rubs, or gallops. Abdomen:  Soft, nontender, nondistended.  Without guarding or rebound. Extremities: Without cyanosis, clubbing, edema, or obvious deformity. Vascular:  Dorsalis pedis and posterior tibial pulses palpable bilaterally. Skin:  Warm and dry, no erythema, no ulcerations.   Data Reviewed: I have personally reviewed following labs and imaging studies  CBC: Recent Labs  Lab 07/19/21 1058 07/24/21 0535 07/25/21 0242 07/25/21 1944 07/26/21 0411  WBC 2.4* 2.8* 3.0*  --  1.4*  NEUTROABS 1.4* 1.4*  --   --   --   HGB 6.3* 5.7* 6.8* 8.4* 7.7*  HCT 19.3* 17.4* 19.8* 24.2* 22.5*  MCV 93.7 96.1 88.0  --  88.6  PLT 18* 19* 22*  --  10*    Basic Metabolic Panel: Recent Labs  Lab 07/19/21 1058 07/24/21 0535 07/25/21 0242 07/26/21 0411  NA 126* 125* 125* 127*  K 4.0 4.1 3.8 3.1*  CL 95* 97* 100 101  CO2 22 18* 16* 20*  GLUCOSE 115* 169* 170* 164*  BUN 20  31* 31* 22  CREATININE 1.00 1.18 0.99 0.89  CALCIUM 9.2 8.3* 7.7* 7.6*    GFR: Estimated Creatinine Clearance: 83.1 mL/min (by C-G formula based on SCr of 0.89 mg/dL). Liver Function Tests: Recent Labs  Lab 07/19/21 1058 07/24/21 0535  AST 21 28  ALT 18 17  ALKPHOS 67 65  BILITOT 0.6 1.0  PROT 7.1 6.8  ALBUMIN 3.5 2.9*    No results for input(s): LIPASE, AMYLASE in the last 168 hours. No results for input(s): AMMONIA in the last 168 hours. Coagulation Profile: Recent Labs  Lab 07/24/21 0535 07/25/21 0242  INR 1.3* 1.3*    Cardiac Enzymes: No results for input(s): CKTOTAL, CKMB, CKMBINDEX, TROPONINI in the last 168 hours. BNP (last 3 results) Recent Labs    12/09/20 1629  PROBNP 415*    HbA1C: No results for input(s): HGBA1C in the last 72 hours. CBG: Recent Labs  Lab 07/25/21 1144 07/25/21 1551 07/25/21 2003 07/25/21 2353 07/26/21 0402  GLUCAP 165* 150* 160* 147* 159*    Lipid Profile: No results for input(s): CHOL, HDL, LDLCALC, TRIG, CHOLHDL, LDLDIRECT in the last 72 hours. Thyroid Function  Tests: No results for input(s): TSH, T4TOTAL, FREET4, T3FREE, THYROIDAB in the last 72 hours. Anemia Panel: No results for input(s): VITAMINB12, FOLATE, FERRITIN, TIBC, IRON, RETICCTPCT in the last 72 hours. Sepsis Labs: Recent Labs  Lab 07/24/21 0535 07/24/21 0805 07/25/21 0242  PROCALCITON  --   --  0.11  LATICACIDVEN 3.1* 4.7*  --      Recent Results (from the past 240 hour(s))  Blood culture (routine single)     Status: None (Preliminary result)   Collection Time: 07/24/21  6:39 AM   Specimen: BLOOD  Result Value Ref Range Status   Specimen Description BLOOD SITE NOT SPECIFIED  Final   Special Requests   Final    BOTTLES DRAWN AEROBIC AND ANAEROBIC Blood Culture adequate volume   Culture   Final    NO GROWTH 2 DAYS Performed at [facility], [address] [place], [place] [address]    Report Status PENDING  Incomplete  Culture, blood  (single)     Status: None (Preliminary result)   Collection Time: 07/24/21  8:05 AM   Specimen: BLOOD  Result Value Ref Range Status   Specimen Description BLOOD LEFT ANTECUBITAL  Final   Special Requests   Final    BOTTLES DRAWN AEROBIC AND ANAEROBIC Blood Culture adequate volume   Culture   Final    NO GROWTH 2 DAYS Performed at [facility], [place] [address] [place], [place] [address]    Report Status PENDING  Incomplete  SARS CORONAVIRUS 2 (TAT 6-24 HRS) Nasopharyngeal Nasopharyngeal Swab     Status: None   Collection Time: 07/24/21  8:45 AM   Specimen: Nasopharyngeal Swab  Result Value Ref Range Status   SARS Coronavirus 2 NEGATIVE NEGATIVE Final    Comment: (NOTE) SARS-CoV-2 target nucleic acids are NOT DETECTED.  The SARS-CoV-2 RNA is generally detectable in upper and lower respiratory specimens during the acute phase of infection. Negative results do not preclude SARS-CoV-2 infection, do not rule out co-infections with other pathogens, and should not be used as the sole basis for treatment or other patient management decisions. Negative results must be combined with clinical observations, patient history, and epidemiological information. The expected result is Negative.  Fact Sheet for Patients: SugarRoll.be  Fact Sheet for Healthcare Providers: https://www.woods-mathews.com/  This test is not yet approved or cleared by the [place] FDA and  has been authorized for detection and/or diagnosis of SARS-CoV-2 by FDA under an Emergency Use Authorization (EUA). This EUA will remain  in effect (meaning this test can be used) for the duration of the COVID-19 declaration under Se ction 564(b)(1) of the Act, 21 U.S.C. section 360bbb-3(b)(1), unless the authorization is terminated or revoked sooner.  Performed at [facility], [place] [address] [place], [place] [address]   MRSA Next Gen by PCR, Nasal     Status: None    Collection Time: 07/24/21  5:59 PM   Specimen: Nasal Mucosa; Nasal Swab  Result Value Ref Range Status   MRSA by PCR Next Gen NOT DETECTED NOT DETECTED Final    Comment: (NOTE) The GeneXpert MRSA Assay (FDA approved for NASAL specimens only), is one component of a comprehensive MRSA colonization surveillance program. It is not intended to diagnose MRSA infection nor to guide or monitor treatment for MRSA infections. Test performance is not FDA approved in patients less than 83 years old. Performed at [facility], [place] [address] [place], [place] [address]  Radiology Studies: No results found.      Scheduled Meds:  sodium chloride   Intravenous Once   chlorhexidine  15 mL Mouth/Throat BID   Chlorhexidine Gluconate Cloth  6 each Topical Daily   fluconazole  100 mg Oral Daily   gabapentin  600 mg Oral QHS   insulin aspart  0-15 Units Subcutaneous Q4H   loratadine  10 mg Oral q morning   multivitamin with minerals  1 tablet Oral q morning   oxymetazoline  1 spray Each Nare BID   [START ON 07/27/2021] pantoprazole  40 mg Intravenous Q12H   potassium chloride  40 mEq Oral BID   Ensure Max Protein  11 oz Oral BID   rosuvastatin  10 mg Oral QHS   sodium chloride  2 spray Each Nare Q2H   sodium chloride flush  10-40 mL Intracatheter Q12H   valACYclovir  1,000 mg Oral Daily   Continuous Infusions:  ceFEPime (MAXIPIME) IV 2 g (07/25/21 2357)   pantoprazole 8 mg/hr (07/25/21 2244)   vancomycin 1,750 mg (07/25/21 0947)     LOS: 2 days   Time spent: 65mn  Natelie Ostrosky C Joeangel Jeanpaul, DO [facility]  If 7PM-7AM, please contact night-coverage [email or web address]  07/26/2021, 7:15 AM

## 2021-07-26 NOTE — Plan of Care (Signed)

## 2021-07-26 NOTE — Progress Notes (Signed)
Date and time results received: 07/26/21 0455  Test: WBC, Critical Value: 1.4  Test: Platelets, Critical Value: 10  Name of Provider Notified: Irene Pap, MD

## 2021-07-26 NOTE — Plan of Care (Signed)
  Problem: Education: Goal: Knowledge of General Education information will improve Description: Including pain rating scale, medication(s)/side effects and non-pharmacologic comfort measures Outcome: Progressing   Problem: Health Behavior/Discharge Planning: Goal: Ability to manage health-related needs will improve Outcome: Progressing   Problem: Clinical Measurements: Goal: Ability to maintain clinical measurements within normal limits will improve Outcome: Progressing Goal: Diagnostic test results will improve Outcome: Progressing Goal: Respiratory complications will improve Outcome: Progressing   Problem: Nutrition: Goal: Adequate nutrition will be maintained Outcome: Progressing   

## 2021-07-26 NOTE — Consult Note (Addendum)
[place]  [phone] Fax:([phone] [place]  Reason for Consultation: High-grade myelodysplasia, pancytopenia  HPI: Mr. Pons is a 75 year old male with a past medical history significant for MDS, CAD status post CABG, aortic stenosis status post valve replacement complicated with strep bacteremia causing endocarditis of the prosthetic valve with recent discharge from the hospital on 7/23.  He had epistaxis from thrombocytopenia and had multiple platelet and PRBC transfusions.  He returned to the emergency department on 7/27 with complaints of chest heaviness as well as hematuria and bleeding from his urethra.  He presented this admission with weakness and shortness of breath.  He had been diagnosed with pneumonia prior to admission and was on oral antibiotics.  He developed coughing spells and worsening shortness of breath.  He had a small episode of hemoptysis overnight.  He received 1 unit of platelets earlier today and has a unit of PRBCs currently infusing.  He denies epistaxis, hematuria, melena, hematochezia.  He reports improvement in his cough after receiving guaifenesin and dextromethorphan.  Shortness of breath is somewhat improved.  He remains on oxygen.  He is not having any fevers or chills.  Denies headaches or dizziness.  Denies chest pain.  Denies abdominal pain, nausea, vomiting.  Denies lower extremity edema.  Hematology was asked see the patient for recommendations regarding his MDS and pancytopenia.  Past Medical History:  Diagnosis Date   Aortic stenosis    Echo (08/2017): Severe AS with mean gradient of 42 mmHg.   Barrett's esophagus    CAD (coronary artery disease) 06/06/2011   Remote stent to the LAD in 2005 // Last cath in 2010 showing diffuse CAD, small PD that is occluded and fills with left to right collaterals, 60-70% 1st OM, with normal EF. Managed medically // Myoview 3/18:  Normal perfusion. LVEF 55% with  normal wall motion. This is a low risk study.   Complication of anesthesia    Diabetes mellitus    ED (erectile dysfunction)    Family history of adverse reaction to anesthesia 10/23/2017   sister had history of malignant hyperthermia when she was 32, she is 73 years old now   GERD (gastroesophageal reflux disease)    Goals of care, counseling/discussion 12/22/2020   Hyperlipidemia    Hypertension    IHD (ischemic heart disease)    prior stenting of the LAD in December of 2005; with last cath in 2010 showing diffuse CAD with normal LV function. He is managed medically.    Malignant hyperthermia    sister had reaction concerning for malignant hyperthermia (~ 1988)   MDS (myelodysplastic syndrome), high grade ([place]) 12/22/2020   Neuromuscular disorder ([place]) 10/23/2017   lumbar 4 disc in back causing nerve pain, on gabapentin   Obesity    Obstructive sleep apnea on CPAP    PONV (postoperative nausea and vomiting)    S/P aortic valve replacement with bioprosthetic valve 10/26/2017   25 mm [facility] Ease bovine pericardial tissue valve   S/P coronary artery stent placement 2005   LAD  :     Past Surgical History:  Procedure Laterality Date   AORTIC VALVE REPLACEMENT N/A 10/26/2017   Procedure: AORTIC VALVE REPLACEMENT (AVR), using Magna Ease 25;  Surgeon: Rexene Alberts, MD;  Location: [place];  Service: Open Heart Surgery;  Laterality: N/A;   CARDIAC CATHETERIZATION  2010   NORMAL LV FUNCTION. DIFFUSE CORONARY DISEASE WITH THE DISTAL PORTION OF THE SMALL POSTERIOR DESCENDING VESSEL  OCCLUDED AND FILLING WITH THE RIGHT COLLATERALS, 60-70% STENOSIS IN THE FIRST OBTUSE MARGINAL VESSEL AND DIFFUSE SMALL VESSEL DISEASE   CAROTID ENDARTERECTOMY     CIRCUMCISION  1985   CORONARY ARTERY BYPASS GRAFT N/A 10/26/2017   Procedure: CORONARY ARTERY BYPASS GRAFTING (CABG) x two , using left internal mammary artery and right leg greater saphenous vein harvested endoscopically;  Surgeon: Rexene Alberts, MD;  Location: [place];  Service: Open Heart Surgery;  Laterality: N/A;   CORONARY STENT PLACEMENT  2005   IR IMAGING GUIDED PORT INSERTION  12/28/2020   RIGHT/LEFT HEART CATH AND CORONARY ANGIOGRAPHY N/A 08/25/2017   Procedure: RIGHT/LEFT HEART CATH AND CORONARY ANGIOGRAPHY;  Surgeon: Nelva Bush, MD;  Location: [place] [facility];  Service: Cardiovascular;  Laterality: N/A;   rotator cuff surgery Right    TEE WITHOUT CARDIOVERSION N/A 10/26/2017   Procedure: TRANSESOPHAGEAL ECHOCARDIOGRAM (TEE);  Surgeon: Rexene Alberts, MD;  Location: [place];  Service: Open Heart Surgery;  Laterality: N/A;   TEE WITHOUT CARDIOVERSION N/A 05/01/2020   Procedure: TRANSESOPHAGEAL ECHOCARDIOGRAM (TEE);  Surgeon: Buford Dresser, MD;  Location: [facility];  Service: Cardiovascular;  Laterality: N/A;  :   CURRENT MEDS: Current Facility-Administered Medications  Medication Dose Route Frequency Provider Last Rate Last Admin   0.9 %  sodium chloride infusion (Manually program via Guardrails IV Fluids)   Intravenous Once [place] Crane, Carole N, DO       acetaminophen (TYLENOL) tablet 650 mg  650 mg Oral Q6H PRN Nolberto Hanlon, MD   650 mg at 07/26/21 1356   Or   acetaminophen (TYLENOL) suppository 650 mg  650 mg Rectal Q6H PRN Nolberto Hanlon, MD       ceFEPIme (MAXIPIME) 2 g in sodium chloride 0.9 % 100 mL IVPB  2 g Intravenous Q8H Godfrey Pick, MD 200 mL/hr at 07/26/21 0906 2 g at 07/26/21 0906   chlorhexidine (PERIDEX) 0.12 % solution 15 mL  15 mL Mouth/Throat BID Nolberto Hanlon, MD   15 mL at 07/26/21 0901   Chlorhexidine Gluconate Cloth 2 % PADS 6 each  6 each Topical Daily Nolberto Hanlon, MD   6 each at 07/26/21 1219   fluconazole (DIFLUCAN) tablet 100 mg  100 mg Oral Daily Nolberto Hanlon, MD   100 mg at 07/26/21 0901   fluticasone (FLONASE) 50 MCG/ACT nasal spray 2 spray  2 spray Each Nare Daily PRN Nolberto Hanlon, MD       gabapentin (NEURONTIN) capsule 600 mg  600 mg Oral QHS Nolberto Hanlon, MD   600 mg at  07/25/21 2104   guaiFENesin-dextromethorphan (ROBITUSSIN DM) 100-10 MG/5ML syrup 5 mL  5 mL Oral Q4H PRN Little Ishikawa, MD   5 mL at 07/26/21 0157   insulin aspart (novoLOG) injection 0-15 Units  0-15 Units Subcutaneous Q4H Nolberto Hanlon, MD   3 Units at 07/26/21 1206   loratadine (CLARITIN) tablet 10 mg  10 mg Oral q morning Nolberto Hanlon, MD   10 mg at 07/26/21 0901   multivitamin with minerals tablet 1 tablet  1 tablet Oral q morning Nolberto Hanlon, MD   1 tablet at 07/26/21 0901   oxymetazoline (AFRIN) 0.05 % nasal spray 1 spray  1 spray Each Nare BID Nolberto Hanlon, MD   1 spray at 07/26/21 0910   [START ON 07/27/2021] pantoprazole (PROTONIX) injection 40 mg  40 mg Intravenous Q12H Nolberto Hanlon, MD       pantoprozole (PROTONIX) 80 mg /NS 100 mL infusion  8 mg/hr Intravenous  Continuous Nolberto Hanlon, MD 10 mL/hr at 07/26/21 1352 8 mg/hr at 07/26/21 1352   potassium chloride SA (KLOR-CON) CR tablet 40 mEq  40 mEq Oral BID Kayleen Memos, DO   40 mEq at 07/26/21 0534   protein supplement (ENSURE MAX) liquid  11 oz Oral BID Little Ishikawa, MD   11 oz at 07/24/21 2139   rosuvastatin (CRESTOR) tablet 10 mg  10 mg Oral QHS Nolberto Hanlon, MD   10 mg at 07/25/21 2104   sodium chloride (OCEAN) 0.65 % nasal spray 2 spray  2 spray Each Nare Barrie Dunker, MD   2 spray at 07/26/21 1430   sodium chloride flush (NS) 0.9 % injection 10-40 mL  10-40 mL Intracatheter Q12H Nolberto Hanlon, MD   10 mL at 07/26/21 0911   sodium chloride flush (NS) 0.9 % injection 10-40 mL  10-40 mL Intracatheter PRN Nolberto Hanlon, MD       valACYclovir (VALTREX) tablet 1,000 mg  1,000 mg Oral Daily Nolberto Hanlon, MD   1,000 mg at 07/26/21 0902   vancomycin (VANCOREADY) IVPB 1750 mg/350 mL  1,750 mg Intravenous Q24H Nolberto Hanlon, MD 175 mL/hr at 07/26/21 1029 1,750 mg at 07/26/21 1029       Allergies  Allergen Reactions   Oxycodone Nausea And Vomiting  :   Family History  Problem Relation Age of Onset   Cancer Mother     Heart attack Father 86   CAD Brother   :   Social History   Socioeconomic History   Marital status: Married    Spouse name: Not on file   Number of children: 3   Years of education: Not on file   Highest education level: Not on file  Occupational History   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, forensic  Tobacco Use   Smoking status: Former    Packs/day: 1.00    Years: 20.00    Pack years: 20.00    Types: Cigarettes    Quit date: 12/12/1992    Years since quitting: 28.6   Smokeless tobacco: Never  Vaping Use   Vaping Use: Never used  Substance and Sexual Activity   Alcohol use: Yes    Alcohol/week: 1.0 standard drink    Types: 1 Cans of beer per week   Drug use: No   Sexual activity: Not Currently  Other Topics Concern   Not on file  Social History Narrative   Not on file   Social Determinants of Health   Financial Resource Strain: Not on file  Food Insecurity: Not on file  Transportation Needs: Not on file  Physical Activity: Not on file  Stress: Not on file  Social Connections: Not on file  Intimate Partner Violence: Not on file  :  REVIEW OF SYSTEMS:  A comprehensive 14 point review of systems was negative except as noted in the HPI.    Exam: Patient Vitals for the past 24 hrs:  BP Temp Temp src Pulse Resp SpO2  07/26/21 1429 124/83 98.5 F (36.9 C) -- (!) 120 (!) 22 --  07/26/21 1415 124/83 98.5 F (36.9 C) Oral -- -- 93 %  07/26/21 1406 -- 98.5 F (36.9 C) Oral -- (!) 22 98 %  07/26/21 1325 130/81 99 F (37.2 C) Oral (!) 120 (!) 30 --  07/26/21 1123 126/77 98.3 F (36.8 C) Oral (!) 124 (!) 30 95 %  07/26/21 1031 (!) 141/72 98.4 F (36.9 C) -- (!) 122 (!) 29 --  07/26/21 1030 (!)  141/72 98.4 F (36.9 C) Oral (!) 122 (!) 29 --  07/26/21 1011 136/77 98.2 F (36.8 C) Oral (!) 118 (!) 27 92 %  07/26/21 0801 137/77 98.4 F (36.9 C) Oral (!) 115 (!) 22 98 %  07/26/21 0403 134/77 98.5 F (36.9 C) Oral (!) 116 20 96 %  07/25/21 2354 132/75 98 F  (36.7 C) Oral (!) 109 20 97 %  07/25/21 2200 126/76 -- -- (!) 108 18 97 %  07/25/21 2100 131/75 -- -- (!) 111 16 98 %  07/25/21 2000 130/63 -- -- (!) 113 20 96 %  07/25/21 1900 130/63 98.3 F (36.8 C) Oral (!) 115 20 96 %  07/25/21 1800 131/67 98.6 F (37 C) Oral (!) 115 (!) 26 94 %  07/25/21 1737 123/73 98.5 F (36.9 C) Oral (!) 116 (!) 32 96 %  07/25/21 1600 126/66 98.6 F (37 C) -- (!) 113 (!) 27 95 %  07/25/21 1540 127/77 98.6 F (37 C) Oral (!) 113 (!) 28 --  07/25/21 1500 140/72 -- -- (!) 115 (!) 25 99 %    General:  well-nourished in no acute distress.   Eyes:  no scleral icterus.   ENT:  There were no oropharyngeal lesions.     Lymphatics:  Negative cervical, supraclavicular or axillary adenopathy.   Respiratory: Diminished breath sounds, no wheezes Cardiovascular:  Regular rate and rhythm, S1/S2, without murmur, rub or gallop.  There was no pedal edema.   GI:  abdomen was soft, flat, nontender, nondistended, without organomegaly.     Skin: No rashes.  Has multiple scattered ecchymoses and petechiae. Neuro exam was nonfocal.  Patient was alert and oriented.  Attention was good.   Language was appropriate.  Mood was normal without depression.  Speech was not pressured.  Thought content was not tangential.    LABS:  Lab Results  Component Value Date   WBC 1.4 (LL) 07/26/2021   HGB 7.7 (L) 07/26/2021   HCT 22.5 (L) 07/26/2021   PLT 10 (LL) 07/26/2021   GLUCOSE 164 (H) 07/26/2021   CHOL 105 05/07/2013   TRIG 110.0 05/07/2013   HDL 35.80 (L) 05/07/2013   LDLCALC 47 05/07/2013   ALT 17 07/24/2021   AST 28 07/24/2021   NA 127 (L) 07/26/2021   K 3.1 (L) 07/26/2021   CL 101 07/26/2021   CREATININE 0.89 07/26/2021   BUN 22 07/26/2021   CO2 20 (L) 07/26/2021   INR 1.3 (H) 07/25/2021   HGBA1C 6.5 (H) 06/29/2021    DG Chest 1 View  Result Date: 07/01/2021 CLINICAL DATA:  Shortness of breath. History of myelodysplastic syndrome. EXAM: CHEST  1 VIEW COMPARISON:  Most  recent radiograph 12/10/2020, CT 12/11/2020 FINDINGS: Right chest port in place. Post median sternotomy with aortic valve replacement. Similar cardiomegaly. Patchy airspace opacity in the right infrahilar lung. Mild interstitial in septal thickening suggestive of pulmonary edema. No pleural fluid or pneumothorax. IMPRESSION: 1. Cardiomegaly with mild interstitial thickening, suspicious for pulmonary edema. 2. Patchy airspace opacity in the right infrahilar lung, may be pneumonia or asymmetric alveolar edema. Electronically Signed   By: Keith Rake M.D.   On: 07/01/2021 19:47   DG Chest 2 View  Result Date: 07/07/2021 CLINICAL DATA:  75 year old male with chest pressure and fatigue for 1 week. Pancytopenia. EXAM: CHEST - 2 VIEW COMPARISON:  Chest radiographs 07/01/2021 and earlier. FINDINGS: PA and lateral views today. Stable right chest power port. Prior sternotomy and cardiac valve replacement. Mediastinal contours are  stable and within normal limits. Regressed pulmonary interstitial opacity from earlier this month. The left lung now appears clear. No pneumothorax or layering pleural fluid. However, there is indistinct right middle lobe opacity on both views, which persists from earlier this month. No acute osseous abnormality identified. Negative visible bowel gas pattern. IMPRESSION: 1. Confluent but indistinct right middle lobe opacity not improved from 6 days ago. This is nonspecific, but consider right middle lobe pneumonia. 2. Otherwise pulmonary interstitial edema appears resolved from earlier this month. No new cardiopulmonary abnormality. Electronically Signed   By: Genevie Ann M.D.   On: 07/07/2021 10:27   CT CHEST WO CONTRAST  Result Date: 07/07/2021 CLINICAL DATA:  Pneumonia EXAM: CT CHEST WITHOUT CONTRAST TECHNIQUE: Multidetector CT imaging of the chest was performed following the standard protocol without IV contrast. COMPARISON:  CT chest abdomen pelvis, 12/11/2020 FINDINGS: Cardiovascular:  Right chest port catheter. Aortic atherosclerosis. Aortic valve prosthesis. Cardiomegaly. Extensive 3 vessel coronary artery calcifications and/or stents. No pericardial effusion. Mediastinum/Nodes: Newly enlarged right hilar, subcarinal, pretracheal, and high right paratracheal lymph nodes, largest right paratracheal node measuring 1.4 x 1.2 cm (series 2, image 27). Thyroid gland, trachea, and esophagus demonstrate no significant findings. Lungs/Pleura: Small bilateral pleural effusions and associated atelectasis or consolidation. Interlobular septal thickening throughout the lungs. There is heterogeneous airspace opacity and consolidation of the lateral segment right middle lobe (series 4, image 75). There are numerous new bilateral pulmonary nodules of varying sizes, for example a 1.7 x 1.5 cm nodule of the left apex (series 4, image 20), a 0.7 cm nodule of the right upper lobe (series 4, image 44), and a 1.4 x 1.3 cm nodule of the peripheral left upper lobe (series 4, image 43). Upper Abdomen: No acute abnormality. Musculoskeletal: No chest wall mass or suspicious bone lesions identified. IMPRESSION: 1. There is heterogeneous airspace opacity and consolidation of the lateral segment right middle lobe , consistent with infection or aspiration. 2. There are numerous new bilateral pulmonary nodules of varying sizes, which are generally nonspecific, possibly infectious or inflammatory in nature although concerning for pulmonary metastatic disease. 3. Newly enlarged mediastinal lymph nodes, which may be reactive to infection, although as above, malignancy is difficult to exclude. 4. Small bilateral pleural effusions and associated atelectasis or consolidation. 5. Interlobular septal thickening throughout the lungs, consistent with pulmonary edema. 6. Cardiomegaly and coronary artery disease. Aortic Atherosclerosis (ICD10-I70.0). Electronically Signed   By: Eddie Candle M.D.   On: 07/07/2021 15:50   CT  BIOPSY  Result Date: 07/01/2021 INDICATION: 75 year old male with pancytopenia. EXAM: CT GUIDED BONE MARROW ASPIRATION AND CORE BIOPSY MEDICATIONS: None. ANESTHESIA/SEDATION: Moderate (conscious) sedation was employed during this procedure. A total of to milligrams versed and 100 micrograms fentanyl were administered intravenously. The patient's level of consciousness and vital signs were monitored continuously by radiology nursing throughout the procedure under my direct supervision. Total monitored sedation time: 20 minutes FLUOROSCOPY TIME:  CT dose (total DLP): 196 mGy cm. COMPLICATIONS: None immediate. Estimated blood loss: <5 mL PROCEDURE: Informed written consent was obtained from the patient after a thorough discussion of the procedural risks, benefits and alternatives. All questions were addressed. Maximal Sterile Barrier Technique was utilized including caps, mask, sterile gowns, sterile gloves, sterile drape, hand hygiene and skin antiseptic. A timeout was performed prior to the initiation of the procedure. The patient was positioned prone and non-contrast localization CT was performed of the pelvis to demonstrate the iliac marrow spaces. Maximal barrier sterile technique utilized including caps, mask, sterile  gowns, sterile gloves, large sterile drape, hand hygiene, and chlorhexidine prep. Under sterile conditions and local anesthesia, an 11 gauge coaxial bone biopsy needle was advanced into the right iliac marrow space. Needle position was confirmed with CT imaging. Initially, bone marrow aspiration was performed. Next, the 11 gauge outer cannula was utilized to obtain a right iliac bone marrow core biopsy. Needle was removed. Hemostasis was obtained with compression. The patient tolerated the procedure well. Samples were prepared with the cytotechnologist. IMPRESSION: Successful CT-guided bone marrow aspiration and biopsy. Electronically Signed   By: Michaelle Birks MD   On: 07/01/2021 14:16   DG  Chest Port 1 View  Result Date: 07/24/2021 CLINICAL DATA:  Questionable sepsis. EXAM: PORTABLE CHEST 1 VIEW COMPARISON:  06/07/2021 FINDINGS: Sternotomy wires overlie normal cardiac silhouette. Port in the anterior chest wall with tip in distal SVC. RIGHT lower lobe pneumonia again demonstrated. Increased in density of fluid over the horizontal fissure. IMPRESSION: Increase in density of RIGHT lower pneumonia with increased fluid in the horizontal fissure. Electronically Signed   By: Suzy Bouchard M.D.   On: 07/24/2021 05:29   CT BONE MARROW BIOPSY & ASPIRATION  Result Date: 07/02/2021 : CT-GUIDED BONE MARROW ASPIRATION AND CORE BIOPSY Duplicate imaging order.  See same day dictation. Electronically Signed   By: Michaelle Birks MD   On: 07/02/2021 17:23   ECHOCARDIOGRAM COMPLETE  Result Date: 07/08/2021    ECHOCARDIOGRAM REPORT   Patient Name:   BETHEL GAGLIO [facility] Date of Exam: 07/08/2021 Medical Rec #:  [id number]   Height:       69.0 in Accession #:    [id number]  Weight:       198.8 lb Date of Birth:  04/28/1946  BSA:          2.061 m Patient Age:    11 years    BP:           137/78 mmHg Patient Gender: M           HR:           88 bpm. Exam Location:  Inpatient Procedure: 2D Echo, Cardiac Doppler and Color Doppler Indications:    Chest pain  History:        Patient has prior history of Echocardiogram examinations, most                 recent 12/11/2020. CAD, Prior CABG, Stroke and Carotid Disease,                 Signs/Symptoms:Chest Pain and Hematuria; Risk                 Factors:Hypertension, Diabetes, Dyslipidemia and Obesity.  Sonographer:    Dustin Flock Referring Phys: [id number] TOCHUKWU AGBATA IMPRESSIONS  1. There is severe hypokinesis of the septum that is out of proportion to postoperative septal movement. This appears new compared with prior. The findings possibly suggest progression of disease proximal to LIMA-LAD insertion. Left ventricular ejection  fraction, by estimation, is 50 to 55%. The  left ventricle has low normal function. The left ventricle demonstrates regional wall motion abnormalities (see scoring diagram/findings for description). There is mild concentric left ventricular hypertrophy. Indeterminate diastolic filling due to E-A fusion.  2. Right ventricular systolic function is normal. The right ventricular size is normal. There is normal pulmonary artery systolic pressure. The estimated right ventricular systolic pressure is 80.9 mmHg.  3. The mitral valve is grossly normal. Mild mitral valve regurgitation. No evidence of mitral  stenosis.  4. 25 mm Magna Ease Pericardial bioprosthesis in the aortic position. Vmax 2.2 m/s, MG 12 mmHG, EOA 1.92 cm2, DI 0.56. The leaflets appear thickened but there is no increase in gradients or parameters to suggest stenosis. The aortic valve has been repaired/replaced. Aortic valve regurgitation is not visualized. Procedure Date: 10/26/2017.  5. The inferior vena cava is normal in size with greater than 50% respiratory variability, suggesting right atrial pressure of 3 mmHg. Comparison(s): Changes from prior study are noted. FINDINGS  Left Ventricle: There is severe hypokinesis of the septum that is out of proportion to postoperative septal movement. This appears new compared with prior. The findings possibly suggest progression of disease proximal to LIMA-LAD insertion. Left ventricular ejection fraction, by estimation, is 50 to 55%. The left ventricle has low normal function. The left ventricle demonstrates regional wall motion abnormalities. The left ventricular internal cavity size was normal in size. There is mild concentric left ventricular hypertrophy. Abnormal (paradoxical) septal motion consistent with post-operative status. Indeterminate diastolic filling due to E-A fusion.  LV Wall Scoring: The mid and distal anterior septum and inferior septum are hypokinetic. Right Ventricle: The right ventricular size is normal. No increase in right ventricular  wall thickness. Right ventricular systolic function is normal. There is normal pulmonary artery systolic pressure. The tricuspid regurgitant velocity is 2.86 m/s, and  with an assumed right atrial pressure of 3 mmHg, the estimated right ventricular systolic pressure is 45.6 mmHg. Left Atrium: Left atrial size was normal in size. Right Atrium: Right atrial size was normal in size. Prominent Chiari network. Pericardium: There is no evidence of pericardial effusion. Mitral Valve: The mitral valve is grossly normal. Mild mitral valve regurgitation. No evidence of mitral valve stenosis. Tricuspid Valve: The tricuspid valve is grossly normal. Tricuspid valve regurgitation is trivial. No evidence of tricuspid stenosis. Aortic Valve: 25 mm Magna Ease Pericardial bioprosthesis in the aortic position. Vmax 2.2 m/s, MG 12 mmHG, EOA 1.92 cm2, DI 0.56. The leaflets appear thickened but there is no increase in gradients or parameters to suggest stenosis. The aortic valve has been repaired/replaced. Aortic valve regurgitation is not visualized. Aortic valve mean gradient measures 13.0 mmHg. Aortic valve peak gradient measures 19.4 mmHg. Aortic valve area, by VTI measures 1.92 cm. There is a 25 mm [employer] Ease bovine valve present in the aortic position. Pulmonic Valve: The pulmonic valve was grossly normal. Pulmonic valve regurgitation is trivial. No evidence of pulmonic stenosis. Aorta: The aortic root and ascending aorta are structurally normal, with no evidence of dilitation. Venous: The inferior vena cava is normal in size with greater than 50% respiratory variability, suggesting right atrial pressure of 3 mmHg. IAS/Shunts: The atrial septum is grossly normal.  LEFT VENTRICLE PLAX 2D LVIDd:         4.90 cm      Diastology LVIDs:         3.50 cm      LV e' medial:    6.53 cm/s LV PW:         1.30 cm      LV E/e' medial:  16.4 LV IVS:        1.20 cm      LV e' lateral:   10.40 cm/s LVOT diam:     2.10 cm      LV E/e'  lateral: 10.3 LV SV:         76 LV SV Index:   37 LVOT Area:     3.46 cm  LV Volumes (MOD) LV vol d, MOD A4C: 120.0 ml LV vol s, MOD A4C: 50.2 ml LV SV MOD A4C:     120.0 ml RIGHT VENTRICLE RV Basal diam:  3.20 cm RV S prime:     9.03 cm/s TAPSE (M-mode): 2.0 cm LEFT ATRIUM             Index       RIGHT ATRIUM           Index LA diam:        4.60 cm 2.23 cm/m  RA Area:     15.30 cm LA Vol (A2C):   78.4 ml 38.04 ml/m RA Volume:   36.30 ml  17.62 ml/m LA Vol (A4C):   53.1 ml 25.77 ml/m LA Biplane Vol: 65.3 ml 31.69 ml/m  AORTIC VALVE AV Area (Vmax):    1.79 cm AV Area (Vmean):   1.54 cm AV Area (VTI):     1.92 cm AV Vmax:           220.33 cm/s AV Vmean:          166.000 cm/s AV VTI:            0.396 m AV Peak Grad:      19.4 mmHg AV Mean Grad:      13.0 mmHg LVOT Vmax:         114.00 cm/s LVOT Vmean:        73.700 cm/s LVOT VTI:          0.220 m LVOT/AV VTI ratio: 0.56  AORTA Ao Root diam: 2.90 cm MITRAL VALVE                TRICUSPID VALVE MV Area (PHT): 3.93 cm     TR Peak grad:   32.7 mmHg MV Decel Time: 193 msec     TR Vmax:        286.00 cm/s MV E velocity: 107.00 cm/s MV A velocity: 32.50 cm/s   SHUNTS MV E/A ratio:  3.29         Systemic VTI:  0.22 m                             Systemic Diam: 2.10 cm Eleonore Chiquito MD Electronically signed by Eleonore Chiquito MD Signature Date/Time: 07/08/2021/2:25:30 PM    Final      ASSESSMENT AND PLAN:  1.  High-grade mild dysplasia 2.  Pancytopenia 3.  Symptomatic anemia secondary to presumed upper GI bleed 4.  Pneumonia 5.  Sepsis secondary to pneumonia, resolving 6.  AVR with bioprosthetic valve 7.  Hypertension 8.  Likely type II NSTEMI/demand ischemia 9.  Non-insulin-dependent diabetes mellitus  -He has been receiving treatment with Vidaza with venetoclax added in June 2022.  He developed disease progression and plan was to switch treatment to decitabine which was scheduled to begin as an outpatient today.  We will need to reschedule the start of  decitabine until after he recovers from this hospitalization. -He has pancytopenia secondary to MDS and GI bleed.  Recommend supportive care.  Transfuse PRBCs to keep hemoglobin above 8.  Transfuse platelets to keep platelet count above 20,000 or for active bleeding.  He has been seen by GI this admission and endoscopy is not planned.  They recommend high-dose IV PPI and supportive transfusions. -Continue antibiotics per hospitalist for treatment of pneumonia/sepsis.  Mikey Bussing, DNP, AGPCNP-BC, AOCNP  ADDENDUM: I saw and examined Mr. Tullis  this morning.  I guess I should not be surprised that he is back in the hospital.  I just wish there is more that we could do for him to try to prevent him from being in the hospital.  He had been doing halfway decent.  We were to try to start him on decitabine as a second line therapy for the myelodysplasia.  I see that gastroenterology is thinking about doing an endoscopy on him.  I would have no problems with this.  He does need to have Procrit.  Hopefully this might help get his blood count up a little bit.  He does need to be on Amicar.  Again he does has very significant myelodysplasia.  I am not sure if we can do anything that could improve his blood count.  I know that he will get outstanding care from all the staff down in the Progressive Care Unit.  Lattie Haw, MD  Exodus 15:26

## 2021-07-27 ENCOUNTER — Inpatient Hospital Stay: Payer: PPO

## 2021-07-27 DIAGNOSIS — D46Z Other myelodysplastic syndromes: Secondary | ICD-10-CM | POA: Diagnosis not present

## 2021-07-27 DIAGNOSIS — A419 Sepsis, unspecified organism: Secondary | ICD-10-CM | POA: Diagnosis not present

## 2021-07-27 DIAGNOSIS — D61818 Other pancytopenia: Secondary | ICD-10-CM | POA: Diagnosis not present

## 2021-07-27 DIAGNOSIS — D649 Anemia, unspecified: Secondary | ICD-10-CM | POA: Diagnosis not present

## 2021-07-27 LAB — BASIC METABOLIC PANEL
Anion gap: 8 (ref 5–15)
BUN: 18 mg/dL (ref 8–23)
CO2: 18 mmol/L — ABNORMAL LOW (ref 22–32)
Calcium: 7.7 mg/dL — ABNORMAL LOW (ref 8.9–10.3)
Chloride: 100 mmol/L (ref 98–111)
Creatinine, Ser: 0.86 mg/dL (ref 0.61–1.24)
GFR, Estimated: >60 mL/min (ref >60)
Glucose, Bld: 172 mg/dL — ABNORMAL HIGH (ref 70–99)
Potassium: 3.5 mmol/L (ref 3.5–5.1)
Sodium: 126 mmol/L — ABNORMAL LOW (ref 135–145)

## 2021-07-27 LAB — CBC
HCT: 21.5 % — ABNORMAL LOW (ref 39.0–52.0)
Hemoglobin: 7.5 g/dL — ABNORMAL LOW (ref 13.0–17.0)
MCH: 31.3 pg (ref 26.0–34.0)
MCHC: 34.9 g/dL (ref 30.0–36.0)
MCV: 89.6 fL (ref 80.0–100.0)
Platelets: 10 10*3/uL — CL (ref 150–400)
RBC: 2.4 MIL/uL — ABNORMAL LOW (ref 4.22–5.81)
RDW: 17.3 % — ABNORMAL HIGH (ref 11.5–15.5)
WBC: 1.3 10*3/uL — CL (ref 4.0–10.5)
nRBC: 9.8 % — ABNORMAL HIGH (ref 0.0–0.2)

## 2021-07-27 LAB — PREPARE PLATELET PHERESIS: Unit division: 0

## 2021-07-27 LAB — GLUCOSE, CAPILLARY
Glucose-Capillary: 133 mg/dL — ABNORMAL HIGH (ref 70–99)
Glucose-Capillary: 161 mg/dL — ABNORMAL HIGH (ref 70–99)
Glucose-Capillary: 163 mg/dL — ABNORMAL HIGH (ref 70–99)
Glucose-Capillary: 167 mg/dL — ABNORMAL HIGH (ref 70–99)
Glucose-Capillary: 170 mg/dL — ABNORMAL HIGH (ref 70–99)
Glucose-Capillary: 199 mg/dL — ABNORMAL HIGH (ref 70–99)
Glucose-Capillary: 235 mg/dL — ABNORMAL HIGH (ref 70–99)

## 2021-07-27 LAB — URINE CULTURE: Culture: NO GROWTH

## 2021-07-27 LAB — BPAM PLATELET PHERESIS
Blood Product Expiration Date: 202208152359
ISSUE DATE / TIME: 202208150943
Unit Type and Rh: 6200

## 2021-07-27 LAB — MAGNESIUM: Magnesium: 1.8 mg/dL (ref 1.7–2.4)

## 2021-07-27 LAB — PATHOLOGIST SMEAR REVIEW

## 2021-07-27 LAB — PREPARE RBC (CROSSMATCH)

## 2021-07-27 MED ORDER — SODIUM CHLORIDE 0.9% IV SOLUTION: Freq: Once | INTRAVENOUS | Status: AC

## 2021-07-27 MED ORDER — ONDANSETRON HCL 4 MG/2ML IJ SOLN
4.0000 mg | Freq: Four times a day (QID) | INTRAMUSCULAR | Status: DC | PRN
  Administered 2021-07-27 – 2021-07-28 (×3): 4 mg via INTRAVENOUS
  Filled 2021-07-27 (×3): qty 2

## 2021-07-27 MED ORDER — MORPHINE SULFATE (PF) 2 MG/ML IV SOLN
1.0000 mg | INTRAVENOUS | Status: DC | PRN
  Administered 2021-07-27 (×2): 1 mg via INTRAVENOUS
  Administered 2021-07-27 – 2021-07-28 (×2): 2 mg via INTRAVENOUS
  Filled 2021-07-27 (×4): qty 1

## 2021-07-27 MED ORDER — MORPHINE (PF) INJECTION FOR INHALATION 10 MG/ML
10.0000 mg | RESPIRATORY_TRACT | Status: DC | PRN
  Filled 2021-07-27: qty 1

## 2021-07-27 MED ORDER — FUROSEMIDE 10 MG/ML IJ SOLN
20.0000 mg | Freq: Once | INTRAMUSCULAR | Status: AC
  Administered 2021-07-27: 20 mg via INTRAVENOUS
  Filled 2021-07-27: qty 2

## 2021-07-27 NOTE — Care Management Important Message (Signed)
Important Message  Patient Details  Name: Theodore Jenkins MRN: [id number] Date of Birth: Jun 25, 1946   Medicare Important Message Given:  Yes     Noriko Macari Montine Circle 07/27/2021, 4:15 PM

## 2021-07-27 NOTE — Progress Notes (Addendum)
OT Cancellation Note  Patient Details Name: Theodore Jenkins MRN: [id number] DOB: Jan 31, 1946   Cancelled Treatment:    Jenkins Eval/Treat Not Completed: Other (comment) Communicated with RN prior to OT attempts. Per RN, pt receiving transfusion of blood and platelets - asks to hold therapy at this time. Will follow-up again as able.   Checked in again around 930AM with RN still recommending holding therapy due to ongoing transfusion, question if active bleed as well.   Layla Maw 07/27/2021, 7:53 AM

## 2021-07-27 NOTE — Progress Notes (Signed)
Theodore Jenkins is still having issues with breathing.  He is not going to undergo a endoscopy.  I totally understand this.  I think there is certain risks that might be significant with doing an endoscopic procedure.  His main concern is just the shortness of breath.  Unsure if this has some to do with his being anemic.  He has a low platelet count.  Platelet count 10,000.  He needs to be given platelets.  He is on Amicar.  He said he has some hemoptysis this morning.  Hopefully, is not having any bleeding into the lungs.  I will try him on some morphine nebulizers.  We will see if this may help a little bit.  If so, then we can certainly do this at home.  I think that he would be a candidate for Palliative Care at home.  I think they can help provide some comfort for him.  I think this would be very important.  He does not have too much of an appetite.  There is no nausea or vomiting.  I think the problem is that he is probably becoming somewhat allo-immunized to transfusions.  This typically happens to our patients who have hematologic malignancies and required a high level of transfusion support.  He is on Amicar.  Over this might help with any bleeding.  I started him on some Procrit to see if this cannot help get his hemoglobin little bit higher.  I think if we get his hemoglobin higher, this will help provide some coagulation protection.  He and his wife both are very aware of his status.  He knows that he will not survive this.  He has always been very aware of his situation.  It is hard to say will count a timeframe where are looking at here.  I would be surprised if he made it through the month of September.  Again, will try the morphine nebulizers and see if this may not help a little bit.  I note he is getting wonderful care from the hard-working staff on [place].  Lattie Haw, MD  Psalm 68:19

## 2021-07-27 NOTE — Progress Notes (Signed)
PROGRESS NOTE    Theodore Jenkins  [id number] DOB: 27-Sep-1946 DOA: 07/24/2021 PCP: Lezlie Octave, PA-C   Brief Narrative:  Theodore Jenkins is a 75 y.o. 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  Return back to the emergency room on 7/27 with complaints of chest heaviness as well as hematuria and bleeding from his urethra, now presents with weakness and sob -found to have FOBT positive stool with tarry bowel movements over the past few weeks somewhat worsening over the past few days.  Concern for possible community-acquired pneumonia at intake as well, hospitalist called for admission.    Assessment & Plan:  Goals of Care -Lengthy discussion at bedside today with patient and wife about goals of care, awaiting further discussion with palliative care. -Given his need for daily transfusions and ongoing worsening symptoms will continue low-dose IV morphine given ongoing respiratory distress and air hunger. -Patient and wife appear to have had lengthy discussion about his prognosis in the past, willing and agreeable to further discuss palliative care and hospice given lack of clear avenue for curative treatment.  Sepsis-due to pneumonia, resolving Acute hypoxic respiratory failure, resolving Lactic acidosis, resolving -Tachycardia, leukopenia, notable right lower lobe opacification on imaging  - Lactic acidosis downtrending with IV fluids and antibiotics appropriately  - Continue cefepime, vancomycin - Blood cultures preliminary negative from this hospital stayy, repeat confirmatory testing from  previous hospitalization pending -Continue as needed Lasix given transfusions as below, hypoxia could be concurrently exacerbated by profound anemia and volume overload  Acute symptomatic anemia secondary to presumed upper GI bleed Chronic anemia of chronic disease (MDS) - Hemoglobin 5.7 on intake after 2 units only 6.8, 1 additional units transfused this morning, 6 units total since admission -appears to be requiring 1 unit PRBC daily to maintain hemoglobin at 8 -Oncology following - patient unfortunately transfusion dependent at baseline given MDS, wife indicates he has had more than 40 units transfusion since his diagnosis responding poorly to treatment as below -Goal Hgb 8; Plt 20k -GI consulted - holding off on endoscopy at this time likely due to high risk procedure given thrombocytopenia -Continue PPI drip -Patient carries history of Barrett's esophagus and gastric ulcers   MDS (myelodysplastic syndrome), high grade ([place]) with pancytopenia -Follows with Dr. Marin Olp, appreciate insight and recommendations  -Per patient and wife patient has had very poor response to previous chemotherapy  -Palliative had previously discussed with the patient, he is currently DNR -we will ask palliative care to follow along while inpatient. - 1 pool platelets ordered again this morning, 3 pool thus far since admission - 1 u PRBC this am - 6 units total since admit  AVR with bioprosthetic valve -Recent H/xo bacteremia GM + strep species (07/07/2021) -noted not to be Enterococcus, agalactiae, pyogenes or pneumoniae - TEE not obtained at that time due to pancytopenia. - Sent home in August with Rocephin 2g daily with f/u as outpt for susceptibilities -Currently on cefepime/vancomycin as above   HTN -Holding home medications given hypotension in the setting of above    Likely type II NSTEMI/demand ischemia with minimally elevated troponin -Secondary to above -Continue transfusions/fluids as  indicated -Could resume home medications  once blood pressure improves   Non-insulin-dependent diabetes type 2  -Continue to hold home medications including Janumet  -Hypoglycemic protocol, sliding scale insulin ongoing   DVT prophylaxis: scd only given likely acute bleed as above Code Status: dnr  Family Communication: wife at bedside   Status is: Inpatient  Dispo: The patient is from: Home              Anticipated d/c is to: To be determined              Anticipated d/c date is: 24 to 48 hours              Patient currently not medically stable for discharge  Consultants:  GI, oncology, palliative care  Procedures:  None  Antimicrobials:  Cefepime, vancomycin  Subjective: No acute issues or events overnight, denies nausea vomiting diarrhea constipation headache fevers chills, ongoing dyspnea at rest worse with any exertion ongoing if not somewhat worse than admission.  Objective: Vitals:   07/26/21 1732 07/26/21 1959 07/27/21 0019 07/27/21 0420  BP: (!) 123/102 121/75 112/66 114/72  Pulse: (!) 119 (!) 116 (!) 120 (!) 116  Resp:  '18 20 20  '$ Temp: 97.9 F (36.6 C) 98.5 F (36.9 C) 98.3 F (36.8 C) 98.4 F (36.9 C)  TempSrc: Oral Oral Oral Oral  SpO2: 97% 96% 99% 100%  Weight:      Height:        Intake/Output Summary (Last 24 hours) at 07/27/2021 0651 Last data filed at 07/26/2021 2125 Gross per 24 hour  Intake 521 ml  Output 1130 ml  Net -609 ml    Filed Weights   07/24/21 0348  Weight: 89.8 kg    Examination:  General:  Pleasantly resting in bed, No acute distress. HEENT:  Normocephalic atraumatic.  Sclerae nonicteric, noninjected.  Extraocular movements intact bilaterally. Neck:  Without mass or deformity.  Trachea is midline. Lungs: Bibasilar rales right greater than left without overt rhonchi or wheeze Heart:  Regular rate and rhythm.  Without murmurs, rubs, or gallops. Abdomen:  Soft, nontender, nondistended.  Without guarding or  rebound. Extremities: Without cyanosis, clubbing, edema, or obvious deformity. Vascular:  Dorsalis pedis and posterior tibial pulses palpable bilaterally. Skin:  Warm and dry, no erythema, no ulcerations.   Data Reviewed: I have personally reviewed following labs and imaging studies  CBC: Recent Labs  Lab 07/24/21 0535 07/25/21 0242 07/25/21 1944 07/26/21 0411 07/26/21 1846 07/27/21 0540  WBC 2.8* 3.0*  --  1.4*  --  1.3*  NEUTROABS 1.4*  --   --   --   --   --   HGB 5.7* 6.8* 8.4* 7.7* 9.6* 7.5*  HCT 17.4* 19.8* 24.2* 22.5* 28.5* 21.5*  MCV 96.1 88.0  --  88.6  --  89.6  PLT 19* 22*  --  10*  --  10*    Basic Metabolic Panel: Recent Labs  Lab 07/24/21 0535 07/25/21 0242 07/26/21 0411 07/27/21 0540  NA 125* 125* 127* 126*  K 4.1 3.8 3.1* 3.5  CL 97* 100 101 100  CO2 18* 16* 20* 18*  GLUCOSE 169* 170* 164* 172*  BUN 31* 31* 22 18  CREATININE 1.18 0.99 0.89 0.86  CALCIUM 8.3* 7.7* 7.6* 7.7*  MG  --   --   --  1.8    GFR: Estimated Creatinine Clearance: 86 mL/min (by C-G formula based on SCr of 0.86 mg/dL). Liver Function Tests: Recent Labs  Lab 07/24/21 0535  AST 28  ALT  17  ALKPHOS 65  BILITOT 1.0  PROT 6.8  ALBUMIN 2.9*    No results for input(s): LIPASE, AMYLASE in the last 168 hours. No results for input(s): AMMONIA in the last 168 hours. Coagulation Profile: Recent Labs  Lab 07/24/21 0535 07/25/21 0242  INR 1.3* 1.3*    Cardiac Enzymes: No results for input(s): CKTOTAL, CKMB, CKMBINDEX, TROPONINI in the last 168 hours. BNP (last 3 results) Recent Labs    12/09/20 1629  PROBNP 415*    HbA1C: No results for input(s): HGBA1C in the last 72 hours. CBG: Recent Labs  Lab 07/26/21 1128 07/26/21 1552 07/26/21 1958 07/27/21 0017 07/27/21 0418  GLUCAP 162* 209* 207* 133* 163*    Lipid Profile: No results for input(s): CHOL, HDL, LDLCALC, TRIG, CHOLHDL, LDLDIRECT in the last 72 hours. Thyroid Function Tests: No results for input(s):  TSH, T4TOTAL, FREET4, T3FREE, THYROIDAB in the last 72 hours. Anemia Panel: No results for input(s): VITAMINB12, FOLATE, FERRITIN, TIBC, IRON, RETICCTPCT in the last 72 hours. Sepsis Labs: Recent Labs  Lab 07/24/21 0535 07/24/21 0805 07/25/21 0242  PROCALCITON  --   --  0.11  LATICACIDVEN 3.1* 4.7*  --      Recent Results (from the past 240 hour(s))  Blood culture (routine single)     Status: None (Preliminary result)   Collection Time: 07/24/21  6:39 AM   Specimen: BLOOD  Result Value Ref Range Status   Specimen Description BLOOD SITE NOT SPECIFIED  Final   Special Requests   Final    BOTTLES DRAWN AEROBIC AND ANAEROBIC Blood Culture adequate volume   Culture   Final    NO GROWTH 2 DAYS Performed at [facility], [address] [place], [place] [address]    Report Status PENDING  Incomplete  Culture, blood (single)     Status: None (Preliminary result)   Collection Time: 07/24/21  8:05 AM   Specimen: BLOOD  Result Value Ref Range Status   Specimen Description BLOOD LEFT ANTECUBITAL  Final   Special Requests   Final    BOTTLES DRAWN AEROBIC AND ANAEROBIC Blood Culture adequate volume   Culture   Final    NO GROWTH 2 DAYS Performed at [facility], [place] [address], [place], [place] [address]    Report Status PENDING  Incomplete  SARS CORONAVIRUS 2 (TAT 6-24 HRS) Nasopharyngeal Nasopharyngeal Swab     Status: None   Collection Time: 07/24/21  8:45 AM   Specimen: Nasopharyngeal Swab  Result Value Ref Range Status   SARS Coronavirus 2 NEGATIVE NEGATIVE Final    Comment: (NOTE) SARS-CoV-2 target nucleic acids are NOT DETECTED.  The SARS-CoV-2 RNA is generally detectable in upper and lower respiratory specimens during the acute phase of infection. Negative results do not preclude SARS-CoV-2 infection, do not rule out co-infections with other pathogens, and should not be used as the sole basis for treatment or other patient management decisions. Negative  results must be combined with clinical observations, patient history, and epidemiological information. The expected result is Negative.  Fact Sheet for Patients: SugarRoll.be  Fact Sheet for Healthcare Providers: https://www.woods-mathews.com/  This test is not yet approved or cleared by the [place] FDA and  has been authorized for detection and/or diagnosis of SARS-CoV-2 by FDA under an Emergency Use Authorization (EUA). This EUA will remain  in effect (meaning this test can be used) for the duration of the COVID-19 declaration under Se ction 564(b)(1) of the Act, 21 U.S.C. section 360bbb-3(b)(1),  unless the authorization is terminated or revoked sooner.  Performed at [facility], [place] [address], [place], [place] [address]   MRSA Next Gen by PCR, Nasal     Status: None   Collection Time: 07/24/21  5:59 PM   Specimen: Nasal Mucosa; Nasal Swab  Result Value Ref Range Status   MRSA by PCR Next Gen NOT DETECTED NOT DETECTED Final    Comment: (NOTE) The GeneXpert MRSA Assay (FDA approved for NASAL specimens only), is one component of a comprehensive MRSA colonization surveillance program. It is not intended to diagnose MRSA infection nor to guide or monitor treatment for MRSA infections. Test performance is not FDA approved in patients less than 79 years old. Performed at [facility], [place] [address] [place], [place] [address]           Radiology Studies: No results found.      Scheduled Meds:  sodium chloride   Intravenous Once   aminocaproic acid  1 g Oral Q8H   chlorhexidine  15 mL Mouth/Throat BID   Chlorhexidine Gluconate Cloth  6 each Topical Daily   epoetin alfa  40,000 Units Subcutaneous Q48H   fluconazole  100 mg Oral Daily   gabapentin  600 mg Oral QHS   insulin aspart  0-15 Units Subcutaneous Q4H   loratadine  10 mg Oral q morning   multivitamin with minerals  1 tablet Oral q morning    pantoprazole  40 mg Intravenous Q12H   Ensure Max Protein  11 oz Oral BID   rosuvastatin  10 mg Oral QHS   sodium chloride  2 spray Each Nare Q2H   sodium chloride flush  10-40 mL Intracatheter Q12H   valACYclovir  1,000 mg Oral Daily   Continuous Infusions:  ceFEPime (MAXIPIME) IV 2 g (07/27/21 0118)   pantoprazole 8 mg/hr (07/26/21 2125)   vancomycin 1,750 mg (07/26/21 1029)     LOS: 3 days   Time spent: 18mn  Konstantine Gervasi C Cortlan Dolin, DO [facility]  If 7PM-7AM, please contact night-coverage [email or web address]  07/27/2021, 6:51 AM

## 2021-07-27 NOTE — Progress Notes (Signed)
Pharmacy Antibiotic Note  Theodore Jenkins is a 75 y.o. male admitted on 07/24/2021 with sepsis  Scr stable 0.86  Height: '5\' 9"'$  (175.3 cm) Weight: 89.8 kg (198 lb) IBW/kg (Calculated) : 70.7  Temp (24hrs), Avg:98.3 F (36.8 C), Min:97.8 F (36.6 C), Max:99 F (37.2 C)  Recent Labs  Lab 07/24/21 0535 07/24/21 0805 07/25/21 0242 07/26/21 0411 07/27/21 0540  WBC 2.8*  --  3.0* 1.4* 1.3*  CREATININE 1.18  --  0.99 0.89 0.86  LATICACIDVEN 3.1* 4.7*  --   --   --      Estimated Creatinine Clearance: 86 mL/min (by C-G formula based on SCr of 0.86 mg/dL).    Allergies  Allergen Reactions   Oxycodone Nausea And Vomiting   Cefepime 8/13>> Vanc 8/13 >  Valtrex ppx  7/27 BCx: strep spp 7/29 BCx: negative 8/13 BCx: pending 8/13 UCx - neg     Plan: Continue Vanc 1750 q24h Continue Cefepime 2 g q8h From notes it appears plan is for 5d antibiotic course- which would end 8/18.  [place] Crane, Roylene Reason, [facility] Clinical Pharmacist  07/27/2021 11:24 AM   [facility] pharmacy phone numbers are listed on [email or web address]

## 2021-07-28 ENCOUNTER — Ambulatory Visit: Payer: PPO

## 2021-07-28 DIAGNOSIS — J189 Pneumonia, unspecified organism: Secondary | ICD-10-CM | POA: Diagnosis not present

## 2021-07-28 DIAGNOSIS — Z66 Do not resuscitate: Secondary | ICD-10-CM

## 2021-07-28 DIAGNOSIS — A419 Sepsis, unspecified organism: Secondary | ICD-10-CM | POA: Diagnosis not present

## 2021-07-28 DIAGNOSIS — Z7189 Other specified counseling: Secondary | ICD-10-CM

## 2021-07-28 DIAGNOSIS — Z515 Encounter for palliative care: Secondary | ICD-10-CM

## 2021-07-28 DIAGNOSIS — D46Z Other myelodysplastic syndromes: Secondary | ICD-10-CM | POA: Diagnosis not present

## 2021-07-28 LAB — TYPE AND SCREEN
ABO/RH(D): A POS
Antibody Screen: NEGATIVE
Unit division: 0
Unit division: 0
Unit division: 0
Unit division: 0
Unit division: 0
Unit division: 0

## 2021-07-28 LAB — BPAM RBC
Blood Product Expiration Date: 202208302359
Blood Product Expiration Date: 202209072359
Blood Product Expiration Date: 202209072359
Blood Product Expiration Date: 202209072359
Blood Product Expiration Date: 202209072359
Blood Product Expiration Date: 202209082359
ISSUE DATE / TIME: 202208130854
ISSUE DATE / TIME: 202208131347
ISSUE DATE / TIME: 202208141221
ISSUE DATE / TIME: 202208141530
ISSUE DATE / TIME: 202208151357
ISSUE DATE / TIME: 202208161008
Unit Type and Rh: 5100
Unit Type and Rh: 5100
Unit Type and Rh: 6200
Unit Type and Rh: 6200
Unit Type and Rh: 6200
Unit Type and Rh: 6200

## 2021-07-28 LAB — CBC
HCT: 23.7 % — ABNORMAL LOW (ref 39.0–52.0)
Hemoglobin: 8.2 g/dL — ABNORMAL LOW (ref 13.0–17.0)
MCH: 30.7 pg (ref 26.0–34.0)
MCHC: 34.6 g/dL (ref 30.0–36.0)
MCV: 88.8 fL (ref 80.0–100.0)
Platelets: 19 10*3/uL — CL (ref 150–400)
RBC: 2.67 MIL/uL — ABNORMAL LOW (ref 4.22–5.81)
RDW: 17.7 % — ABNORMAL HIGH (ref 11.5–15.5)
WBC: 1.7 10*3/uL — ABNORMAL LOW (ref 4.0–10.5)
nRBC: 10.8 % — ABNORMAL HIGH (ref 0.0–0.2)

## 2021-07-28 LAB — PREPARE PLATELET PHERESIS: Unit division: 0

## 2021-07-28 LAB — BPAM PLATELET PHERESIS
Blood Product Expiration Date: 202208172359
ISSUE DATE / TIME: 202208160737
Unit Type and Rh: 6200

## 2021-07-28 LAB — GLUCOSE, CAPILLARY
Glucose-Capillary: 167 mg/dL — ABNORMAL HIGH (ref 70–99)
Glucose-Capillary: 157 mg/dL — ABNORMAL HIGH (ref 70–99)
Glucose-Capillary: 202 mg/dL — ABNORMAL HIGH (ref 70–99)
Glucose-Capillary: 179 mg/dL — ABNORMAL HIGH (ref 70–99)
Glucose-Capillary: 208 mg/dL — ABNORMAL HIGH (ref 70–99)

## 2021-07-28 MED ORDER — SENNOSIDES-DOCUSATE SODIUM 8.6-50 MG PO TABS
1.0000 | ORAL_TABLET | Freq: Every day | ORAL | Status: DC
  Administered 2021-07-28: 1 via ORAL
  Filled 2021-07-28: qty 1

## 2021-07-28 MED ORDER — PANTOPRAZOLE SODIUM 40 MG PO TBEC
40.0000 mg | DELAYED_RELEASE_TABLET | Freq: Two times a day (BID) | ORAL | Status: DC
  Administered 2021-07-28 – 2021-07-29 (×3): 40 mg via ORAL
  Filled 2021-07-28 (×3): qty 1

## 2021-07-28 MED ORDER — LORAZEPAM 0.5 MG PO TABS
0.5000 mg | ORAL_TABLET | ORAL | Status: DC | PRN
  Administered 2021-07-28 – 2021-07-29 (×3): 0.5 mg via ORAL
  Filled 2021-07-28 (×3): qty 1

## 2021-07-28 MED ORDER — SODIUM CHLORIDE 0.9 % IV SOLN
2.0000 g | INTRAVENOUS | Status: DC
  Administered 2021-07-28: 2 g via INTRAVENOUS
  Filled 2021-07-28 (×2): qty 20

## 2021-07-28 MED ORDER — ONDANSETRON HCL 4 MG PO TABS: 4.0000 mg | ORAL_TABLET | Freq: Four times a day (QID) | ORAL | Status: DC | PRN

## 2021-07-28 MED ORDER — MORPHINE SULFATE (CONCENTRATE) 10 MG/0.5ML PO SOLN
10.0000 mg | ORAL | Status: DC | PRN
  Administered 2021-07-28 (×3): 10 mg via ORAL
  Filled 2021-07-28 (×3): qty 0.5

## 2021-07-28 MED ORDER — MORPHINE (PF) INJECTION FOR INHALATION 10 MG/ML: 10.0000 mg | RESPIRATORY_TRACT | Status: DC | PRN

## 2021-07-28 NOTE — Plan of Care (Signed)
  Problem: Education: Goal: Knowledge of General Education information will improve Description: Including pain rating scale, medication(s)/side effects and non-pharmacologic comfort measures Outcome: Progressing   Problem: Health Behavior/Discharge Planning: Goal: Ability to manage health-related needs will improve Outcome: Progressing   Problem: Clinical Measurements: Goal: Ability to maintain clinical measurements within normal limits will improve Outcome: Progressing Goal: Will remain free from infection Outcome: Progressing Goal: Diagnostic test results will improve Outcome: Progressing Goal: Respiratory complications will improve Outcome: Progressing Goal: Cardiovascular complication will be avoided Outcome: Progressing   Problem: Activity: Goal: Risk for activity intolerance will decrease Outcome: Progressing   Problem: Nutrition: Goal: Adequate nutrition will be maintained Outcome: Progressing   Problem: Coping: Goal: Level of anxiety will decrease Outcome: Progressing   Problem: Elimination: Goal: Will not experience complications related to bowel motility Outcome: Progressing Goal: Will not experience complications related to urinary retention Outcome: Progressing   Problem: Pain Managment: Goal: General experience of comfort will improve Outcome: Progressing   Problem: Pain Managment: Goal: General experience of comfort will improve Outcome: Progressing

## 2021-07-28 NOTE — Progress Notes (Signed)
Overall, Theodore Jenkins might be feeling a little bit better.  He has had no bleeding.  His platelet count is 19,000.  His hemoglobin is 8.2.  His white cell count is 1.7.  There has been no fever.  Again, so far, cultures are all negative.  His appetite might be a little bit better.  There has been no nausea or vomiting.  He has had no diarrhea.  He is on Procrit every other day.  We are trying to see if we cannot force the bone marrow to make more red blood cells.  The vital signs show temperature 97.9.  Pulse 110.  Blood pressure 113/78.  Oxygen saturation is 100%.  His lungs sound relatively clear bilaterally.  He has good air movement bilaterally.  No wheezes are noted.  Cardiac exam tachycardic but regular.  Abdomen is soft.  Bowel sounds are present.  There is no guarding or rebound tenderness.  Strongly shows some slight edema in his legs.  Theodore Jenkins has his "pneumonia."  He is on antibiotics.  He does seem to be improving a little bit.  I think we can just get his red cell count higher, he will feel better.  I do think that morphine nebulizers would help him out.  I think this to be helpful for him at home when he gets short of breath.  I must of into the order wrong yesterday.  I reentered the order today.  Again, I do think that this is going to be helpful for him.  We are clearly in a quality of life mode.  We want comfort for him.  Having better respiratory status will improve his quality of life.  Lattie Haw, MD  Romans 15:13

## 2021-07-28 NOTE — Progress Notes (Signed)
PT Cancellation Note  Patient Details Name: Theodore Jenkins MRN: [id number] DOB: 02-24-1946   Cancelled Treatment:    Reason Eval/Treat Not Completed: Other (comment) (per Pallative team, pt now comfort care, DC acute PT. Please cancel order and re-consult if new needs arise.)   Carlene Coria 07/28/2021, 5:08 PM

## 2021-07-28 NOTE — Plan of Care (Signed)

## 2021-07-28 NOTE — Evaluation (Signed)
Occupational Therapy Evaluation Patient Details Name: Theodore Jenkins MRN: [id number] DOB: Jan 02, 1946 Today's Date: 07/28/2021    History of Present Illness Pt is a 75 y/o male admitted secondary to worsening weakness and SOB. Found to have anemia and workup pending. Per notes, possibly secondary to GI bleed. PMH includes myelodysplastic syndrome, CAD s/p CABG, DM, OSA on CPAP, and s/p AVR.   Clinical Impression   PTA, pt lives with spouse and reports functional decline in mobility due to progressive SOB/weakness. Pt remains motivated to maintain independence. Pt overall Supervision for LB ADLs and basic transfers using RW though requires frequent rest breaks due to dyspnea. Extended time spent discussing daily routine, DME needs, family support, and energy conservation (handout provided). Noted pt awaiting to speak with palliative for [place] decisions. Will continue to follow acutely to reinforce energy conservation carryover, caregiver education/support and mobility to/from bathroom (as b/r is not w/c accessible at home).   SpO2 >96% on 6 L O2. HR 120 at rest, 129 with transfer. 3/4 DOE.     Follow Up Recommendations  Home health OT;Other (comment) (vs not OT follow-up pending [place] discussion)    Equipment Recommendations  3 in 1 bedside commode    Recommendations for Other Services       Precautions / Restrictions Precautions Precautions: Fall;Other (comment) Precaution Comments: Low platelets, WBC Restrictions Weight Bearing Restrictions: No      Mobility Bed Mobility Overal bed mobility: Modified Independent Bed Mobility: Supine to Sit     Supine to sit: Modified independent (Device/Increase time)     General bed mobility comments: use of bed rail    Transfers Overall transfer level: Needs assistance Equipment used: Rolling walker (2 wheeled) Transfers: Sit to/from [facility] Sit to Stand: Supervision Stand pivot transfers: Supervision       General  transfer comment: no physical assist needed    Balance Overall balance assessment: Needs assistance Sitting-balance support: No upper extremity supported;Feet supported Sitting balance-Leahy Scale: Good     Standing balance support: Bilateral upper extremity supported Standing balance-Leahy Scale: Fair Standing balance comment: fair static standing, benefits from UE support for dynamic tasks                           ADL either performed or assessed with clinical judgement   ADL Overall ADL's : Needs assistance/impaired Eating/Feeding: Independent   Grooming: Independent;Sitting   Upper Body Bathing: Independent;Sitting   Lower Body Bathing: Supervison/ safety;Sit to/from stand   Upper Body Dressing : Independent;Sitting   Lower Body Dressing: Supervision/safety;Sit to/from stand Lower Body Dressing Details (indicate cue type and reason): able to cross B LE to reach feet. discussed energy conservation strategies with LB dressing at home Toilet Transfer: Supervision/safety;Stand-pivot;BSC;RW Toilet Transfer Details (indicate cue type and reason): simulated to recliner Toileting- Clothing Manipulation and Hygiene: Supervision/safety;Sit to/from stand         General ADL Comments: Pt limited by increasing SOB with activity requiring frequent rest breaks. Provided energy conservation handout with wife entering at end of session and participatory in education. Educated on use of BSC over toilet to ease transfers, as well as use beside bed if functional abilities worsen and unable to access bathroom     Vision Baseline Vision/History: Wears glasses Wears Glasses: At all times Patient Visual Report: No change from baseline Vision Assessment?: No apparent visual deficits     Perception     Praxis      Pertinent Vitals/Pain  Pain Assessment: No/denies pain     Hand Dominance Right   Extremity/Trunk Assessment Upper Extremity Assessment Upper Extremity  Assessment: Overall WFL for tasks assessed   Lower Extremity Assessment Lower Extremity Assessment: Defer to PT evaluation   Cervical / Trunk Assessment Cervical / Trunk Assessment: Normal   Communication Communication Communication: No difficulties   Cognition Arousal/Alertness: Awake/alert Behavior During Therapy: WFL for tasks assessed/performed Overall Cognitive Status: Within Functional Limits for tasks assessed                                 General Comments: Pleasant and motivated   General Comments  SpO2 WFL on 6 L O2, 3/4 DOE at times with minimal activity. HR 120 at rest, 129 during transfer    Exercises     Shoulder Instructions      Home Living Family/patient expects to be discharged to:: Private residence Living Arrangements: Spouse/significant other Available Help at Discharge: Family;Available 24 hours/day Type of Home: House Home Access: Stairs to enter [employer] of Steps: 1+1 at front without rails, 4-5 on back with railings   Home Layout: Two level;Able to live on main level with bedroom/bathroom     Bathroom Shower/Tub: Occupational psychologist: Standard     Home Equipment: Environmental consultant - 2 wheels;Cane - single point;Shower seat;Walker - 4 wheels;Wheelchair - manual;Grab bars - tub/shower;Grab bars - toilet          Prior Functioning/Environment Level of Independence: Needs assistance  Gait / Transfers Assistance Needed: Reports he has been using WC for mobility here lately. Can ambulate short distances using RW as wc will not fit through all doorways ADL's / Homemaking Assistance Needed: Wife assists as needed but pt able to complete ADLs without assist typically. Pt has been doing bird baths (with warm wipes similar to CHG bath) or wife assisting with covering port access and showering tasks            OT Problem List: Decreased strength;Decreased activity tolerance;Impaired balance (sitting and/or  standing);Cardiopulmonary status limiting activity      OT Treatment/Interventions: Self-care/ADL training;Therapeutic exercise;Energy conservation;DME and/or AE instruction;Therapeutic activities;Patient/family education;Balance training    OT Goals(Current goals can be found in the care plan section) Acute Rehab OT Goals Patient Stated Goal: decrease SOB, go home, speak with palliative OT Goal Formulation: With patient Time For Goal Achievement: 08/11/21 Potential to Achieve Goals: Good  OT Frequency: Min 2X/week   Barriers to D/C:            Co-evaluation              AM-PAC OT "6 Clicks" Daily Activity     Outcome Measure Help from another person eating meals?: None Help from another person taking care of personal grooming?: A Little Help from another person toileting, which includes using toliet, bedpan, or urinal?: A Little Help from another person bathing (including washing, rinsing, drying)?: A Little Help from another person to put on and taking off regular upper body clothing?: A Little Help from another person to put on and taking off regular lower body clothing?: A Little 6 Click Score: 19   End of Session Equipment Utilized During Treatment: Rolling walker;Oxygen Nurse Communication: Mobility status;Other (comment) (O2, DME needs)  Activity Tolerance: Patient tolerated treatment well Patient left: in chair;with call bell/phone within reach;with family/visitor present  OT Visit Diagnosis: Unsteadiness on feet (R26.81);Muscle weakness (generalized) (M62.81)  TimeID:[id number] OT Time Calculation (min): 54 min Charges:  OT General Charges $OT Visit: 1 Visit OT Evaluation $OT Eval Moderate Complexity: 1 Mod OT Treatments $Self Care/Home Management : 8-22 mins $Therapeutic Activity: 8-22 mins  Malachy Chamber, OTR/L Acute Rehab Services Office: [phone]   Layla Maw 07/28/2021, 8:59 AM

## 2021-07-28 NOTE — Progress Notes (Signed)
PROGRESS NOTE    Theodore Jenkins  KVQ:[id number] DOB: [birth year] DOA: 07/24/2021 PCP: Lezlie Octave, PA-C   Brief Narrative:  Theodore Jenkins is a 75 y.o. male with medical history significant of myelodysplastic syndrome currently transfusion dependent, CAD status post CABG, aortic stenosis status post valve replacement complicated with strep bacteremia causing endocarditis of the prosthetic valve. Recent multiple hospitalization.   Admitted with weakness and shortness of breath.  Recent multiple hospitalization.  He was found with thrombocytopenia, anemia and GI bleeding.  Admitted and treated symptomatically.  See goal of care discussion below.  Assessment & Plan:  Goals of Care discussion. I met with patient and his wife at the bedside today in the morning rounds.  We discussed about ongoing issues, difficulty to treat conditions.   Patient and his wife were ready to accept that he has untreatable condition and at this time he is suffering.  They were waiting to talk to palliative care.  His care was mostly guided by oncology who also recommend home hospice.   Patient is at end-of-life and will be best served by hospice.  He has enough support system at home.   I called hospice consult, notified social worker to call [facility] of [place] due to family preference.   Called and discussed with palliative care, will optimize his comfort care medications before transitioning home.   Will start patient on sublingual morphine and sublingual concentrated Ativan.   Oncology has started him on morphine nebulizers, not sure this can be done at home.    Sepsis present on admission due to bilateral pneumonia with hypoxemia: Lactic acidosis, resolving -Tachycardia, leukopenia, notable right lower lobe opacification on imaging  - Lactic acidosis downtrending with IV fluids and antibiotics appropriately  - Patient received 5 days of cefepime and vancomycin.  We will discontinue further antibiotic  therapy. --Cultures are negative so far. --Will need oxygen to go home.  Acute symptomatic anemia secondary to presumed upper GI bleed Chronic anemia of chronic disease (MDS) - Hemoglobin 5.7 on presentation.  Received total 6 units of PRBC on this hospitalization last 5 days.  Patient received total of 6 units of PRBC and 3 units of platelets on this admission. -- Currently on Procrit.  Will discontinue on discharge.   -- Goal of care discussion above, will discontinue further transfusions. --Continue Protonix, will change to oral on discharge.   MDS (myelodysplastic syndrome), high grade (HCC) with pancytopenia -Follows with Dr. Marin Olp, appreciate insight and recommendations  -Per patient and wife patient has had very poor response to previous chemotherapy  -Plan to go home with home hospice.  AVR with bioprosthetic valve -Recent H/xo bacteremia GM + strep species (07/07/2021) -noted not to be Enterococcus, agalactiae, pyogenes or pneumoniae - TEE not obtained at that time due to pancytopenia. - Sent home on 8/3 with Rocephin 2g daily until 9/9.  Patient received 5 days of cefepime and vancomycin.  Resume Rocephin.  Patient has Port-A-Cath and cannot take it.     HTN -Holding home medications given hypotension in the setting of above    Likely type II NSTEMI/demand ischemia with minimally elevated troponin -Secondary to above -Continue transfusions/fluids as indicated   Non-insulin-dependent diabetes type 2  -Continue to hold home medications including Janumet  -Hypoglycemic protocol, sliding scale insulin ongoing   DVT prophylaxis: scd only given likely acute bleed as above Code Status: dnr  Family Communication: wife at bedside   Status is: Inpatient  Dispo: The patient is from: Home  Anticipated d/c is to: Home with hospice.              Anticipated d/c date is: 24 to 48 hours              Patient currently not medically stable for discharge  Consultants:   GI, oncology, palliative care  Procedures:  None  Antimicrobials:  Cefepime, vancomycin, discontinue. Rocephin 2 g daily---  Subjective: Patient seen and examined.  Wife was at the bedside.  He was anxious, he tells me that he had had enough.  He gets short of breath on getting out of the bed to the chair.  Denies any pain.   Both husband and wife were very open to discussion about death and hospice care at home.   Objective: Vitals:   07/27/21 1958 07/27/21 2344 07/28/21 0321 07/28/21 1100  BP: 123/69 110/65 113/78 123/79  Pulse: (!) 126 (!) 110 (!) 110 (!) 113  Resp: (!) 29 14 15 19   Temp: 98.3 F (36.8 C) 98.1 F (36.7 C) 97.9 F (36.6 C) 98.7 F (37.1 C)  TempSrc: Oral Oral Oral Axillary  SpO2: 92% 94% 100% 97%  Weight:      Height:        Intake/Output Summary (Last 24 hours) at 07/28/2021 1337 Last data filed at 07/28/2021 1145 Gross per 24 hour  Intake 1914.07 ml  Output 2875 ml  Net -960.93 ml   Filed Weights   07/24/21 0348  Weight: 89.8 kg    Examination: General: Chronically sick looking.  Frail and debilitated gentleman.  Not in any distress.  He is currently on 4 L of oxygen. Cardiovascular: S1-S2 normal.  Tachycardic. Respiratory: Bilateral conducted airway sounds.  Mostly clear.  Right chest wall Port-A-Cath present. Gastrointestinal: Soft.  Nontender.  Bowel sounds present. Ext: Trace bilateral pedal edema.  No cyanosis. Neuro: Alert oriented x4.  No deficits.     Data Reviewed: I have personally reviewed following labs and imaging studies  CBC: Recent Labs  Lab 07/24/21 0535 07/25/21 0242 07/25/21 1944 07/26/21 0411 07/26/21 1846 07/27/21 0540 07/28/21 0345  WBC 2.8* 3.0*  --  1.4*  --  1.3* 1.7*  NEUTROABS 1.4*  --   --   --   --   --   --   HGB 5.7* 6.8* 8.4* 7.7* 9.6* 7.5* 8.2*  HCT 17.4* 19.8* 24.2* 22.5* 28.5* 21.5* 23.7*  MCV 96.1 88.0  --  88.6  --  89.6 88.8  PLT 19* 22*  --  10*  --  10* 19*   Basic Metabolic  Panel: Recent Labs  Lab 07/24/21 0535 07/25/21 0242 07/26/21 0411 07/27/21 0540  NA 125* 125* 127* 126*  K 4.1 3.8 3.1* 3.5  CL 97* 100 101 100  CO2 18* 16* 20* 18*  GLUCOSE 169* 170* 164* 172*  BUN 31* 31* 22 18  CREATININE 1.18 0.99 0.89 0.86  CALCIUM 8.3* 7.7* 7.6* 7.7*  MG  --   --   --  1.8   GFR: Estimated Creatinine Clearance: 86 mL/min (by C-G formula based on SCr of 0.86 mg/dL). Liver Function Tests: Recent Labs  Lab 07/24/21 0535  AST 28  ALT 17  ALKPHOS 65  BILITOT 1.0  PROT 6.8  ALBUMIN 2.9*   No results for input(s): LIPASE, AMYLASE in the last 168 hours. No results for input(s): AMMONIA in the last 168 hours. Coagulation Profile: Recent Labs  Lab 07/24/21 0535 07/25/21 0242  INR 1.3* 1.3*   Cardiac Enzymes:  No results for input(s): CKTOTAL, CKMB, CKMBINDEX, TROPONINI in the last 168 hours. BNP (last 3 results) Recent Labs    12/09/20 1629  PROBNP 415*   HbA1C: No results for input(s): HGBA1C in the last 72 hours. CBG: Recent Labs  Lab 07/27/21 2001 07/27/21 2347 07/28/21 0327 07/28/21 0802 07/28/21 1143  GLUCAP 235* 161* 167* 157* 202*   Lipid Profile: No results for input(s): CHOL, HDL, LDLCALC, TRIG, CHOLHDL, LDLDIRECT in the last 72 hours. Thyroid Function Tests: No results for input(s): TSH, T4TOTAL, FREET4, T3FREE, THYROIDAB in the last 72 hours. Anemia Panel: No results for input(s): VITAMINB12, FOLATE, FERRITIN, TIBC, IRON, RETICCTPCT in the last 72 hours. Sepsis Labs: Recent Labs  Lab 07/24/21 0535 07/24/21 0805 07/25/21 0242  PROCALCITON  --   --  0.11  LATICACIDVEN 3.1* 4.7*  --     Recent Results (from the past 240 hour(s))  Blood culture (routine single)     Status: None (Preliminary result)   Collection Time: 07/24/21  6:39 AM   Specimen: BLOOD  Result Value Ref Range Status   Specimen Description BLOOD SITE NOT SPECIFIED  Final   Special Requests   Final    BOTTLES DRAWN AEROBIC AND ANAEROBIC Blood Culture  adequate volume   Culture   Final    NO GROWTH 4 DAYS Performed at [facility], [address] [place], [place] [address]    Report Status PENDING  Incomplete  Culture, blood (single)     Status: None (Preliminary result)   Collection Time: 07/24/21  8:05 AM   Specimen: BLOOD  Result Value Ref Range Status   Specimen Description BLOOD LEFT ANTECUBITAL  Final   Special Requests   Final    BOTTLES DRAWN AEROBIC AND ANAEROBIC Blood Culture adequate volume   Culture   Final    NO GROWTH 4 DAYS Performed at [facility], [place] [address], [place], [place] [address]    Report Status PENDING  Incomplete  SARS CORONAVIRUS 2 (TAT 6-24 HRS) Nasopharyngeal Nasopharyngeal Swab     Status: None   Collection Time: 07/24/21  8:45 AM   Specimen: Nasopharyngeal Swab  Result Value Ref Range Status   SARS Coronavirus 2 NEGATIVE NEGATIVE Final    Comment: (NOTE) SARS-CoV-2 target nucleic acids are NOT DETECTED.  The SARS-CoV-2 RNA is generally detectable in upper and lower respiratory specimens during the acute phase of infection. Negative results do not preclude SARS-CoV-2 infection, do not rule out co-infections with other pathogens, and should not be used as the sole basis for treatment or other patient management decisions. Negative results must be combined with clinical observations, patient history, and epidemiological information. The expected result is Negative.  Fact Sheet for Patients: SugarRoll.be  Fact Sheet for Healthcare Providers: https://www.woods-mathews.com/  This test is not yet approved or cleared by the [place] FDA and  has been authorized for detection and/or diagnosis of SARS-CoV-2 by FDA under an Emergency Use Authorization (EUA). This EUA will remain  in effect (meaning this test can be used) for the duration of the COVID-19 declaration under Se ction 564(b)(1) of the Act, 21 U.S.C. section 360bbb-3(b)(1),  unless the authorization is terminated or revoked sooner.  Performed at [facility], [place] [address], [place], [place] [address]   MRSA Next Gen by PCR, Nasal     Status: None   Collection Time: 07/24/21  5:59 PM   Specimen: Nasal Mucosa; Nasal Swab  Result Value Ref Range Status  MRSA by PCR Next Gen NOT DETECTED NOT DETECTED Final    Comment: (NOTE) The GeneXpert MRSA Assay (FDA approved for NASAL specimens only), is one component of a comprehensive MRSA colonization surveillance program. It is not intended to diagnose MRSA infection nor to guide or monitor treatment for MRSA infections. Test performance is not FDA approved in patients less than 59 years old. Performed at [facility], [place] [address], [place], [place] [address]   Urine Culture     Status: None   Collection Time: 07/24/21  8:17 PM   Specimen: Urine, Clean Catch  Result Value Ref Range Status   Specimen Description URINE, CLEAN CATCH  Final   Special Requests NONE  Final   Culture   Final    NO GROWTH Performed at [facility], [place] [address], [place], [place] [address]    Report Status 07/27/2021 FINAL  Final          Radiology Studies: No results found.      Scheduled Meds:  sodium chloride   Intravenous Once   aminocaproic acid  1 g Oral Q8H   chlorhexidine  15 mL Mouth/Throat BID   Chlorhexidine Gluconate Cloth  6 each Topical Daily   epoetin alfa  40,000 Units Subcutaneous Q48H   fluconazole  100 mg Oral Daily   gabapentin  600 mg Oral QHS   insulin aspart  0-15 Units Subcutaneous Q4H   loratadine  10 mg Oral q morning   multivitamin with minerals  1 tablet Oral q morning   pantoprazole  40 mg Oral BID   Ensure Max Protein  11 oz Oral BID   rosuvastatin  10 mg Oral QHS   sodium chloride  2 spray Each Nare Q2H   sodium chloride flush  10-40 mL Intracatheter Q12H   valACYclovir  1,000 mg Oral Daily   Continuous Infusions:  cefTRIAXone (ROCEPHIN)  IV        LOS:  4 days   Time spent: 35 minutes  If 7PM-7AM, please contact night-coverage [email or web address]  07/28/2021, 1:37 PM

## 2021-07-29 ENCOUNTER — Ambulatory Visit: Payer: PPO

## 2021-07-29 DIAGNOSIS — D46Z Other myelodysplastic syndromes: Secondary | ICD-10-CM | POA: Diagnosis not present

## 2021-07-29 DIAGNOSIS — A419 Sepsis, unspecified organism: Secondary | ICD-10-CM | POA: Diagnosis not present

## 2021-07-29 DIAGNOSIS — R0602 Shortness of breath: Secondary | ICD-10-CM

## 2021-07-29 DIAGNOSIS — J189 Pneumonia, unspecified organism: Secondary | ICD-10-CM | POA: Diagnosis not present

## 2021-07-29 DIAGNOSIS — Z515 Encounter for palliative care: Secondary | ICD-10-CM

## 2021-07-29 LAB — CULTURE, BLOOD (SINGLE)
Culture: NO GROWTH
Culture: NO GROWTH
Special Requests: ADEQUATE
Special Requests: ADEQUATE

## 2021-07-29 LAB — PREPARE RBC (CROSSMATCH)

## 2021-07-29 LAB — GLUCOSE, CAPILLARY
Glucose-Capillary: 177 mg/dL — ABNORMAL HIGH (ref 70–99)
Glucose-Capillary: 196 mg/dL — ABNORMAL HIGH (ref 70–99)
Glucose-Capillary: 202 mg/dL — ABNORMAL HIGH (ref 70–99)
Glucose-Capillary: 271 mg/dL — ABNORMAL HIGH (ref 70–99)
Glucose-Capillary: 295 mg/dL — ABNORMAL HIGH (ref 70–99)

## 2021-07-29 LAB — CBC
HCT: 21.8 % — ABNORMAL LOW (ref 39.0–52.0)
Hemoglobin: 7.3 g/dL — ABNORMAL LOW (ref 13.0–17.0)
MCH: 30.3 pg (ref 26.0–34.0)
MCHC: 33.5 g/dL (ref 30.0–36.0)
MCV: 90.5 fL (ref 80.0–100.0)
Platelets: 16 10*3/uL — CL (ref 150–400)
RBC: 2.41 MIL/uL — ABNORMAL LOW (ref 4.22–5.81)
RDW: 17.6 % — ABNORMAL HIGH (ref 11.5–15.5)
WBC: 2.6 10*3/uL — ABNORMAL LOW (ref 4.0–10.5)
nRBC: 10 % — ABNORMAL HIGH (ref 0.0–0.2)

## 2021-07-29 MED ORDER — SENNOSIDES-DOCUSATE SODIUM 8.6-50 MG PO TABS
2.0000 | ORAL_TABLET | Freq: Two times a day (BID) | ORAL | Status: DC
  Administered 2021-07-29: 2 via ORAL
  Filled 2021-07-29: qty 2

## 2021-07-29 MED ORDER — SODIUM CHLORIDE 0.9% IV SOLUTION: Freq: Once | INTRAVENOUS | Status: AC

## 2021-07-29 MED ORDER — LORAZEPAM 1 MG PO TABS: 1.0000 mg | ORAL_TABLET | ORAL | 0 refills | Status: AC | PRN

## 2021-07-29 MED ORDER — FUROSEMIDE 10 MG/ML IJ SOLN
60.0000 mg | Freq: Once | INTRAMUSCULAR | Status: AC
  Administered 2021-07-29: 60 mg via INTRAVENOUS
  Filled 2021-07-29: qty 6

## 2021-07-29 MED ORDER — MORPHINE SULFATE (CONCENTRATE) 10 MG/0.5ML PO SOLN
5.0000 mg | ORAL | Status: DC
  Administered 2021-07-29 (×2): 5 mg via ORAL
  Filled 2021-07-29 (×2): qty 0.5

## 2021-07-29 MED ORDER — MORPHINE SULFATE (CONCENTRATE) 10 MG/0.5ML PO SOLN: 10.0000 mg | ORAL | 0 refills | Status: AC | PRN

## 2021-07-29 MED ORDER — LORAZEPAM 1 MG PO TABS
1.0000 mg | ORAL_TABLET | ORAL | Status: DC | PRN
  Administered 2021-07-29 (×2): 1 mg via ORAL
  Filled 2021-07-29 (×2): qty 1

## 2021-07-29 MED ORDER — LORAZEPAM 1 MG PO TABS: 1.0000 mg | ORAL_TABLET | Freq: Two times a day (BID) | ORAL | Status: DC

## 2021-07-29 NOTE — Discharge Summary (Signed)
Physician Discharge Summary  Theodore Jenkins DDU:[id number] DOB: 1946-10-24 DOA: 07/24/2021  PCP: Lezlie Octave, PA-C  Admit date: 07/24/2021 Discharge date: 07/29/2021  Admitted From: Home Disposition: Inpatient hospice  Recommendations for Outpatient Follow-up:  As per hospice plan  Home Health: Not applicable Equipment/Devices: Not applicable  Discharge Condition: Serious CODE STATUS: DNR/DNI Diet recommendation: Regular diet as tolerated Discharge summary: 75 y.o. male with medical history significant of myelodysplastic syndrome currently transfusion dependent, CAD status post CABG, aortic stenosis status post valve replacement complicated with strep bacteremia causing endocarditis of the prosthetic valve. Recent multiple hospitalization. admitted with weakness and shortness of breath.  He was found with thrombocytopenia, anemia and GI bleeding.  Admitted and treated symptomatically.  End-of-life Severe myelodysplastic syndrome with no therapeutic options, transfusion dependent. 7 PRBC and 3 platelets needed last 5 days. Failure to thrive Acute on chronic symptomatic anemia Aortic valve replacement with bioprosthetic valve, recent Enterococcus endocarditis Hypertension Diabetes  Patient remained in very poor clinical condition, currently with shortness of breath, dyspnea, mobility restrictions and failure to thrive.  Cancer with no therapeutic options for treatment. After careful discussion and oncology input, a comfort care approach was recommended. Patient and family agreed on comfort care and hospice. Patient will be transferred to inpatient hospice when bed is available. Currently on symptomatic treatment with benzodiazepines, morphine with good symptom control. He will not benefit with further blood transfusions all blood draws, will discontinue all. He has limited life expectancy days to weeks, further antibiotics will not benefit outcome.  Will discontinue  antibiotics on discharge. Patient will be medicated for comfort before transferring in the vehicle.   Discharge Diagnoses:  Active Problems:   Anemia   High grade myelodysplastic syndrome (HCC)   Sepsis ([place])   End of life care    Discharge Instructions  Discharge Instructions     Diet general   Complete by: As directed    Increase activity slowly   Complete by: As directed       Allergies as of 07/29/2021       Reactions   Oxycodone Nausea And Vomiting        Medication List     STOP taking these medications    amoxicillin 500 MG capsule Commonly known as: AMOXIL   cefTRIAXone  IVPB Commonly known as: ROCEPHIN   cefTRIAXone 2 g in sodium chloride 0.9 % 100 mL   chlorhexidine 0.12 % solution Commonly known as: Peridex   Ensure Max Protein Liqd   famciclovir 500 MG tablet Commonly known as: FAMVIR   fluticasone 50 MCG/ACT nasal spray Commonly known as: FLONASE   loratadine 10 MG tablet Commonly known as: CLARITIN   metoprolol succinate 50 MG 24 hr tablet Commonly known as: TOPROL-XL   multivitamin with minerals Tabs tablet   mupirocin ointment 2 % Commonly known as: BACTROBAN   nitroGLYCERIN 0.4 MG SL tablet Commonly known as: NITROSTAT   OneTouch Verio test strip Generic drug: glucose blood   oxymetazoline 0.05 % nasal spray Commonly known as: AFRIN   rosuvastatin 10 MG tablet Commonly known as: CRESTOR   sodium chloride 0.65 % Soln nasal spray Commonly known as: OCEAN   tranexamic acid 650 MG Tabs tablet Commonly known as: LYSTEDA       TAKE these medications    acetaminophen 500 MG tablet Commonly known as: TYLENOL Take 1,000 mg by mouth every 8 (eight) hours as needed for mild pain (or headaches).   fluconazole 100 MG tablet Commonly known as: DIFLUCAN  Take 1 tablet (100 mg total) by mouth daily.   gabapentin 300 MG capsule Commonly known as: NEURONTIN Take 600 mg by mouth at bedtime.   LORazepam 1 MG  tablet Commonly known as: ATIVAN Take 1 tablet (1 mg total) by mouth every 4 (four) hours as needed for anxiety or sleep.   morphine CONCENTRATE 10 MG/0.5ML Soln concentrated solution Take 0.5 mLs (10 mg total) by mouth every 2 (two) hours as needed for severe pain, shortness of breath or anxiety.   omeprazole 20 MG capsule Commonly known as: PRILOSEC Take 20 mg by mouth at bedtime.   polyethylene glycol 17 g packet Commonly known as: MIRALAX / GLYCOLAX Take 17 g by mouth daily as needed for moderate constipation.   senna 8.6 MG Tabs tablet Commonly known as: SENOKOT Take 1 tablet (8.6 mg total) by mouth 2 (two) times daily. What changed:  when to take this reasons to take this   sitaGLIPtin-metformin 50-1000 MG tablet Commonly known as: JANUMET Take 1 tablet by mouth 2 (two) times daily with a meal.        Allergies  Allergen Reactions   Oxycodone Nausea And Vomiting    Consultations: Oncology Palliative   Procedures/Studies: DG Chest 1 View  Result Date: 07/01/2021 CLINICAL DATA:  Shortness of breath. History of myelodysplastic syndrome. EXAM: CHEST  1 VIEW COMPARISON:  Most recent radiograph 12/10/2020, CT 12/11/2020 FINDINGS: Right chest port in place. Post median sternotomy with aortic valve replacement. Similar cardiomegaly. Patchy airspace opacity in the right infrahilar lung. Mild interstitial in septal thickening suggestive of pulmonary edema. No pleural fluid or pneumothorax. IMPRESSION: 1. Cardiomegaly with mild interstitial thickening, suspicious for pulmonary edema. 2. Patchy airspace opacity in the right infrahilar lung, may be pneumonia or asymmetric alveolar edema. Electronically Signed   By: Keith Rake M.D.   On: 07/01/2021 19:47   DG Chest 2 View  Result Date: 07/07/2021 CLINICAL DATA:  75 year old male with chest pressure and fatigue for 1 week. Pancytopenia. EXAM: CHEST - 2 VIEW COMPARISON:  Chest radiographs 07/01/2021 and earlier. FINDINGS:  PA and lateral views today. Stable right chest power port. Prior sternotomy and cardiac valve replacement. Mediastinal contours are stable and within normal limits. Regressed pulmonary interstitial opacity from earlier this month. The left lung now appears clear. No pneumothorax or layering pleural fluid. However, there is indistinct right middle lobe opacity on both views, which persists from earlier this month. No acute osseous abnormality identified. Negative visible bowel gas pattern. IMPRESSION: 1. Confluent but indistinct right middle lobe opacity not improved from 6 days ago. This is nonspecific, but consider right middle lobe pneumonia. 2. Otherwise pulmonary interstitial edema appears resolved from earlier this month. No new cardiopulmonary abnormality. Electronically Signed   By: Genevie Ann M.D.   On: 07/07/2021 10:27   CT CHEST WO CONTRAST  Result Date: 07/07/2021 CLINICAL DATA:  Pneumonia EXAM: CT CHEST WITHOUT CONTRAST TECHNIQUE: Multidetector CT imaging of the chest was performed following the standard protocol without IV contrast. COMPARISON:  CT chest abdomen pelvis, 12/11/2020 FINDINGS: Cardiovascular: Right chest port catheter. Aortic atherosclerosis. Aortic valve prosthesis. Cardiomegaly. Extensive 3 vessel coronary artery calcifications and/or stents. No pericardial effusion. Mediastinum/Nodes: Newly enlarged right hilar, subcarinal, pretracheal, and high right paratracheal lymph nodes, largest right paratracheal node measuring 1.4 x 1.2 cm (series 2, image 27). Thyroid gland, trachea, and esophagus demonstrate no significant findings. Lungs/Pleura: Small bilateral pleural effusions and associated atelectasis or consolidation. Interlobular septal thickening throughout the lungs. There is heterogeneous airspace  opacity and consolidation of the lateral segment right middle lobe (series 4, image 75). There are numerous new bilateral pulmonary nodules of varying sizes, for example a 1.7 x 1.5 cm  nodule of the left apex (series 4, image 20), a 0.7 cm nodule of the right upper lobe (series 4, image 44), and a 1.4 x 1.3 cm nodule of the peripheral left upper lobe (series 4, image 43). Upper Abdomen: No acute abnormality. Musculoskeletal: No chest wall mass or suspicious bone lesions identified. IMPRESSION: 1. There is heterogeneous airspace opacity and consolidation of the lateral segment right middle lobe , consistent with infection or aspiration. 2. There are numerous new bilateral pulmonary nodules of varying sizes, which are generally nonspecific, possibly infectious or inflammatory in nature although concerning for pulmonary metastatic disease. 3. Newly enlarged mediastinal lymph nodes, which may be reactive to infection, although as above, malignancy is difficult to exclude. 4. Small bilateral pleural effusions and associated atelectasis or consolidation. 5. Interlobular septal thickening throughout the lungs, consistent with pulmonary edema. 6. Cardiomegaly and coronary artery disease. Aortic Atherosclerosis (ICD10-I70.0). Electronically Signed   By: Eddie Candle M.D.   On: 07/07/2021 15:50   CT BIOPSY  Result Date: 07/01/2021 INDICATION: 74 year old male with pancytopenia. EXAM: CT GUIDED BONE MARROW ASPIRATION AND CORE BIOPSY MEDICATIONS: None. ANESTHESIA/SEDATION: Moderate (conscious) sedation was employed during this procedure. A total of to milligrams versed and 100 micrograms fentanyl were administered intravenously. The patient's level of consciousness and vital signs were monitored continuously by radiology nursing throughout the procedure under my direct supervision. Total monitored sedation time: 20 minutes FLUOROSCOPY TIME:  CT dose (total DLP): 196 mGy cm. COMPLICATIONS: None immediate. Estimated blood loss: <5 mL PROCEDURE: Informed written consent was obtained from the patient after a thorough discussion of the procedural risks, benefits and alternatives. All questions were addressed.  Maximal Sterile Barrier Technique was utilized including caps, mask, sterile gowns, sterile gloves, sterile drape, hand hygiene and skin antiseptic. A timeout was performed prior to the initiation of the procedure. The patient was positioned prone and non-contrast localization CT was performed of the pelvis to demonstrate the iliac marrow spaces. Maximal barrier sterile technique utilized including caps, mask, sterile gowns, sterile gloves, large sterile drape, hand hygiene, and chlorhexidine prep. Under sterile conditions and local anesthesia, an 11 gauge coaxial bone biopsy needle was advanced into the right iliac marrow space. Needle position was confirmed with CT imaging. Initially, bone marrow aspiration was performed. Next, the 11 gauge outer cannula was utilized to obtain a right iliac bone marrow core biopsy. Needle was removed. Hemostasis was obtained with compression. The patient tolerated the procedure well. Samples were prepared with the cytotechnologist. IMPRESSION: Successful CT-guided bone marrow aspiration and biopsy. Electronically Signed   By: Michaelle Birks MD   On: 07/01/2021 14:16   DG Chest Port 1 View  Result Date: 07/24/2021 CLINICAL DATA:  Questionable sepsis. EXAM: PORTABLE CHEST 1 VIEW COMPARISON:  06/07/2021 FINDINGS: Sternotomy wires overlie normal cardiac silhouette. Port in the anterior chest wall with tip in distal SVC. RIGHT lower lobe pneumonia again demonstrated. Increased in density of fluid over the horizontal fissure. IMPRESSION: Increase in density of RIGHT lower pneumonia with increased fluid in the horizontal fissure. Electronically Signed   By: Suzy Bouchard M.D.   On: 07/24/2021 05:29   CT BONE MARROW BIOPSY & ASPIRATION  Result Date: 07/02/2021 : CT-GUIDED BONE MARROW ASPIRATION AND CORE BIOPSY Duplicate imaging order.  See same day dictation. Electronically Signed   By: Wille Glaser  Mugweru MD   On: 07/02/2021 17:23   ECHOCARDIOGRAM COMPLETE  Result Date: 07/08/2021     ECHOCARDIOGRAM REPORT   Patient Name:   Theodore Jenkins [facility] Date of Exam: 07/08/2021 Medical Rec #:  [id number]   Height:       69.0 in Accession #:    [id number]  Weight:       198.8 lb Date of Birth:  1946-08-20  BSA:          2.061 m Patient Age:    51 years    BP:           137/78 mmHg Patient Gender: M           HR:           88 bpm. Exam Location:  Inpatient Procedure: 2D Echo, Cardiac Doppler and Color Doppler Indications:    Chest pain  History:        Patient has prior history of Echocardiogram examinations, most                 recent 12/11/2020. CAD, Prior CABG, Stroke and Carotid Disease,                 Signs/Symptoms:Chest Pain and Hematuria; Risk                 Factors:Hypertension, Diabetes, Dyslipidemia and Obesity.  Sonographer:    Dustin Flock Referring Phys: [id number] TOCHUKWU AGBATA IMPRESSIONS  1. There is severe hypokinesis of the septum that is out of proportion to postoperative septal movement. This appears new compared with prior. The findings possibly suggest progression of disease proximal to LIMA-LAD insertion. Left ventricular ejection  fraction, by estimation, is 50 to 55%. The left ventricle has low normal function. The left ventricle demonstrates regional wall motion abnormalities (see scoring diagram/findings for description). There is mild concentric left ventricular hypertrophy. Indeterminate diastolic filling due to E-A fusion.  2. Right ventricular systolic function is normal. The right ventricular size is normal. There is normal pulmonary artery systolic pressure. The estimated right ventricular systolic pressure is 45.8 mmHg.  3. The mitral valve is grossly normal. Mild mitral valve regurgitation. No evidence of mitral stenosis.  4. 25 mm Magna Ease Pericardial bioprosthesis in the aortic position. Vmax 2.2 m/s, MG 12 mmHG, EOA 1.92 cm2, DI 0.56. The leaflets appear thickened but there is no increase in gradients or parameters to suggest stenosis. The aortic valve has been  repaired/replaced. Aortic valve regurgitation is not visualized. Procedure Date: 10/26/2017.  5. The inferior vena cava is normal in size with greater than 50% respiratory variability, suggesting right atrial pressure of 3 mmHg. Comparison(s): Changes from prior study are noted. FINDINGS  Left Ventricle: There is severe hypokinesis of the septum that is out of proportion to postoperative septal movement. This appears new compared with prior. The findings possibly suggest progression of disease proximal to LIMA-LAD insertion. Left ventricular ejection fraction, by estimation, is 50 to 55%. The left ventricle has low normal function. The left ventricle demonstrates regional wall motion abnormalities. The left ventricular internal cavity size was normal in size. There is mild concentric left ventricular hypertrophy. Abnormal (paradoxical) septal motion consistent with post-operative status. Indeterminate diastolic filling due to E-A fusion.  LV Wall Scoring: The mid and distal anterior septum and inferior septum are hypokinetic. Right Ventricle: The right ventricular size is normal. No increase in right ventricular wall thickness. Right ventricular systolic function is normal. There is normal pulmonary artery systolic pressure. The tricuspid regurgitant  velocity is 2.86 m/s, and  with an assumed right atrial pressure of 3 mmHg, the estimated right ventricular systolic pressure is 28.3 mmHg. Left Atrium: Left atrial size was normal in size. Right Atrium: Right atrial size was normal in size. Prominent Chiari network. Pericardium: There is no evidence of pericardial effusion. Mitral Valve: The mitral valve is grossly normal. Mild mitral valve regurgitation. No evidence of mitral valve stenosis. Tricuspid Valve: The tricuspid valve is grossly normal. Tricuspid valve regurgitation is trivial. No evidence of tricuspid stenosis. Aortic Valve: 25 mm Magna Ease Pericardial bioprosthesis in the aortic position. Vmax 2.2 m/s, MG  12 mmHG, EOA 1.92 cm2, DI 0.56. The leaflets appear thickened but there is no increase in gradients or parameters to suggest stenosis. The aortic valve has been repaired/replaced. Aortic valve regurgitation is not visualized. Aortic valve mean gradient measures 13.0 mmHg. Aortic valve peak gradient measures 19.4 mmHg. Aortic valve area, by VTI measures 1.92 cm. There is a 25 mm [employer] Ease bovine valve present in the aortic position. Pulmonic Valve: The pulmonic valve was grossly normal. Pulmonic valve regurgitation is trivial. No evidence of pulmonic stenosis. Aorta: The aortic root and ascending aorta are structurally normal, with no evidence of dilitation. Venous: The inferior vena cava is normal in size with greater than 50% respiratory variability, suggesting right atrial pressure of 3 mmHg. IAS/Shunts: The atrial septum is grossly normal.  LEFT VENTRICLE PLAX 2D LVIDd:         4.90 cm      Diastology LVIDs:         3.50 cm      LV e' medial:    6.53 cm/s LV PW:         1.30 cm      LV E/e' medial:  16.4 LV IVS:        1.20 cm      LV e' lateral:   10.40 cm/s LVOT diam:     2.10 cm      LV E/e' lateral: 10.3 LV SV:         76 LV SV Index:   37 LVOT Area:     3.46 cm  LV Volumes (MOD) LV vol d, MOD A4C: 120.0 ml LV vol s, MOD A4C: 50.2 ml LV SV MOD A4C:     120.0 ml RIGHT VENTRICLE RV Basal diam:  3.20 cm RV S prime:     9.03 cm/s TAPSE (M-mode): 2.0 cm LEFT ATRIUM             Index       RIGHT ATRIUM           Index LA diam:        4.60 cm 2.23 cm/m  RA Area:     15.30 cm LA Vol (A2C):   78.4 ml 38.04 ml/m RA Volume:   36.30 ml  17.62 ml/m LA Vol (A4C):   53.1 ml 25.77 ml/m LA Biplane Vol: 65.3 ml 31.69 ml/m  AORTIC VALVE AV Area (Vmax):    1.79 cm AV Area (Vmean):   1.54 cm AV Area (VTI):     1.92 cm AV Vmax:           220.33 cm/s AV Vmean:          166.000 cm/s AV VTI:            0.396 m AV Peak Grad:      19.4 mmHg AV Mean Grad:      13.0  mmHg LVOT Vmax:         114.00 cm/s LVOT Vmean:         73.700 cm/s LVOT VTI:          0.220 m LVOT/AV VTI ratio: 0.56  AORTA Ao Root diam: 2.90 cm MITRAL VALVE                TRICUSPID VALVE MV Area (PHT): 3.93 cm     TR Peak grad:   32.7 mmHg MV Decel Time: 193 msec     TR Vmax:        286.00 cm/s MV E velocity: 107.00 cm/s MV A velocity: 32.50 cm/s   SHUNTS MV E/A ratio:  3.29         Systemic VTI:  0.22 m                             Systemic Diam: 2.10 cm Eleonore Chiquito MD Electronically signed by Eleonore Chiquito MD Signature Date/Time: 07/08/2021/2:25:30 PM    Final    (Echo, Carotid, EGD, Colonoscopy, ERCP)    Subjective: Patient seen and examined.  Patient thinks that he is not going to survive long.  He has some shakiness and tremors.  Family was not sure whether they can manage symptoms at home.  Decided to go to inpatient hospice which is appropriate.   Discharge Exam: Vitals:   07/29/21 1014 07/29/21 1035  BP:  (!) 127/96  Pulse:  (!) 122  Resp:  20  Temp: 97.9 F (36.6 C) 97.9 F (36.6 C)  SpO2:  93%   Vitals:   07/29/21 0746 07/29/21 1008 07/29/21 1014 07/29/21 1035  BP:  113/69  (!) 127/96  Pulse:  (!) 122  (!) 122  Resp:  (!) 29  20  Temp: 98.1 F (36.7 C) 97.6 F (36.4 C) 97.9 F (36.6 C) 97.9 F (36.6 C)  TempSrc: Oral Oral Oral Oral  SpO2:  90%  93%  Weight:      Height:       General: Chronically sick looking.  Frail and debilitated gentleman.  Not in any distress.  He is currently on SpO2: 93 % O2 Flow Rate (L/min): 10 L/min  Cardiovascular: S1-S2 normal.  Tachycardic. Respiratory: Bilateral conducted airway sounds.  Mostly clear.  Right chest wall Port-A-Cath present. Gastrointestinal: Soft.  Nontender.  Bowel sounds present. Ext: Trace bilateral pedal edema.  No cyanosis. Neuro: Alert oriented x4.  No deficits.    The results of significant diagnostics from this hospitalization (including imaging, microbiology, ancillary and laboratory) are listed below for reference.     Microbiology: Recent Results  (from the past 240 hour(s))  Blood culture (routine single)     Status: None (Preliminary result)   Collection Time: 07/24/21  6:39 AM   Specimen: BLOOD  Result Value Ref Range Status   Specimen Description BLOOD SITE NOT SPECIFIED  Final   Special Requests   Final    BOTTLES DRAWN AEROBIC AND ANAEROBIC Blood Culture adequate volume   Culture   Final    NO GROWTH 4 DAYS Performed at [facility], [address] [place], [place] [address]    Report Status PENDING  Incomplete  Culture, blood (single)     Status: None (Preliminary result)   Collection Time: 07/24/21  8:05 AM   Specimen: BLOOD  Result Value Ref Range Status   Specimen Description BLOOD LEFT ANTECUBITAL  Final   Special  Requests   Final    BOTTLES DRAWN AEROBIC AND ANAEROBIC Blood Culture adequate volume   Culture   Final    NO GROWTH 4 DAYS Performed at [facility], [place] [address], [place], [place] [address]    Report Status PENDING  Incomplete  SARS CORONAVIRUS 2 (TAT 6-24 HRS) Nasopharyngeal Nasopharyngeal Swab     Status: None   Collection Time: 07/24/21  8:45 AM   Specimen: Nasopharyngeal Swab  Result Value Ref Range Status   SARS Coronavirus 2 NEGATIVE NEGATIVE Final    Comment: (NOTE) SARS-CoV-2 target nucleic acids are NOT DETECTED.  The SARS-CoV-2 RNA is generally detectable in upper and lower respiratory specimens during the acute phase of infection. Negative results do not preclude SARS-CoV-2 infection, do not rule out co-infections with other pathogens, and should not be used as the sole basis for treatment or other patient management decisions. Negative results must be combined with clinical observations, patient history, and epidemiological information. The expected result is Negative.  Fact Sheet for Patients: SugarRoll.be  Fact Sheet for Healthcare Providers: https://www.woods-mathews.com/  This test is not yet approved or cleared by the  [place] FDA and  has been authorized for detection and/or diagnosis of SARS-CoV-2 by FDA under an Emergency Use Authorization (EUA). This EUA will remain  in effect (meaning this test can be used) for the duration of the COVID-19 declaration under Se ction 564(b)(1) of the Act, 21 U.S.C. section 360bbb-3(b)(1), unless the authorization is terminated or revoked sooner.  Performed at [facility], [place] [address] [place], [place] [address]   MRSA Next Gen by PCR, Nasal     Status: None   Collection Time: 07/24/21  5:59 PM   Specimen: Nasal Mucosa; Nasal Swab  Result Value Ref Range Status   MRSA by PCR Next Gen NOT DETECTED NOT DETECTED Final    Comment: (NOTE) The GeneXpert MRSA Assay (FDA approved for NASAL specimens only), is one component of a comprehensive MRSA colonization surveillance program. It is not intended to diagnose MRSA infection nor to guide or monitor treatment for MRSA infections. Test performance is not FDA approved in patients less than 42 years old. Performed at [facility], [place] [address] [place], [place] [address]   Urine Culture     Status: None   Collection Time: 07/24/21  8:17 PM   Specimen: Urine, Clean Catch  Result Value Ref Range Status   Specimen Description URINE, CLEAN CATCH  Final   Special Requests NONE  Final   Culture   Final    NO GROWTH Performed at [facility], [place] [address] [place], [place] [address]    Report Status 07/27/2021 FINAL  Final     Labs: BNP (last 3 results) Recent Labs    07/07/21 1106 07/24/21 0535  BNP 825.3* 0,630.1*   Basic Metabolic Panel: Recent Labs  Lab 07/24/21 0535 07/25/21 0242 07/26/21 0411 07/27/21 0540  NA 125* 125* 127* 126*  K 4.1 3.8 3.1* 3.5  CL 97* 100 101 100  CO2 18* 16* 20* 18*  GLUCOSE 169* 170* 164* 172*  BUN 31* 31* 22 18  CREATININE 1.18 0.99 0.89 0.86  CALCIUM 8.3* 7.7* 7.6* 7.7*  MG  --   --   --  1.8   Liver Function Tests: Recent Labs  Lab  07/24/21 0535  AST 28  ALT 17  ALKPHOS 65  BILITOT 1.0  PROT 6.8  ALBUMIN 2.9*   No results  for input(s): LIPASE, AMYLASE in the last 168 hours. No results for input(s): AMMONIA in the last 168 hours. CBC: Recent Labs  Lab 07/24/21 0535 07/25/21 0242 07/25/21 1944 07/26/21 0411 07/26/21 1846 07/27/21 0540 07/28/21 0345 07/29/21 0455  WBC 2.8* 3.0*  --  1.4*  --  1.3* 1.7* 2.6*  NEUTROABS 1.4*  --   --   --   --   --   --   --   HGB 5.7* 6.8*   < > 7.7* 9.6* 7.5* 8.2* 7.3*  HCT 17.4* 19.8*   < > 22.5* 28.5* 21.5* 23.7* 21.8*  MCV 96.1 88.0  --  88.6  --  89.6 88.8 90.5  PLT 19* 22*  --  10*  --  10* 19* 16*   < > = values in this interval not displayed.   Cardiac Enzymes: No results for input(s): CKTOTAL, CKMB, CKMBINDEX, TROPONINI in the last 168 hours. BNP: Invalid input(s): POCBNP CBG: Recent Labs  Lab 07/28/21 1719 07/28/21 2046 07/29/21 0006 07/29/21 0359 07/29/21 0743  GLUCAP 179* 208* 202* 177* 196*   D-Dimer No results for input(s): DDIMER in the last 72 hours. Hgb A1c No results for input(s): HGBA1C in the last 72 hours. Lipid Profile No results for input(s): CHOL, HDL, LDLCALC, TRIG, CHOLHDL, LDLDIRECT in the last 72 hours. Thyroid function studies No results for input(s): TSH, T4TOTAL, T3FREE, THYROIDAB in the last 72 hours.  Invalid input(s): FREET3 Anemia work up No results for input(s): VITAMINB12, FOLATE, FERRITIN, TIBC, IRON, RETICCTPCT in the last 72 hours. Urinalysis    Component Value Date/Time   COLORURINE YELLOW 07/07/2021 1321   APPEARANCEUR HAZY (A) 07/07/2021 1321   LABSPEC 1.017 07/07/2021 1321   PHURINE 6.0 07/07/2021 1321   GLUCOSEU NEGATIVE 07/07/2021 1321   HGBUR LARGE (A) 07/07/2021 1321   BILIRUBINUR NEGATIVE 07/07/2021 1321   KETONESUR NEGATIVE 07/07/2021 1321   PROTEINUR 30 (A) 07/07/2021 1321   NITRITE NEGATIVE 07/07/2021 1321   LEUKOCYTESUR NEGATIVE 07/07/2021 1321   Sepsis Labs Invalid input(s): PROCALCITONIN,   WBC,  LACTICIDVEN Microbiology Recent Results (from the past 240 hour(s))  Blood culture (routine single)     Status: None (Preliminary result)   Collection Time: 07/24/21  6:39 AM   Specimen: BLOOD  Result Value Ref Range Status   Specimen Description BLOOD SITE NOT SPECIFIED  Final   Special Requests   Final    BOTTLES DRAWN AEROBIC AND ANAEROBIC Blood Culture adequate volume   Culture   Final    NO GROWTH 4 DAYS Performed at [facility], [address], [place], [place] [address]    Report Status PENDING  Incomplete  Culture, blood (single)     Status: None (Preliminary result)   Collection Time: 07/24/21  8:05 AM   Specimen: BLOOD  Result Value Ref Range Status   Specimen Description BLOOD LEFT ANTECUBITAL  Final   Special Requests   Final    BOTTLES DRAWN AEROBIC AND ANAEROBIC Blood Culture adequate volume   Culture   Final    NO GROWTH 4 DAYS Performed at [facility], [place] [address] [place], [place] [address]    Report Status PENDING  Incomplete  SARS CORONAVIRUS 2 (TAT 6-24 HRS) Nasopharyngeal Nasopharyngeal Swab     Status: None   Collection Time: 07/24/21  8:45 AM   Specimen: Nasopharyngeal Swab  Result Value Ref Range Status   SARS Coronavirus 2 NEGATIVE NEGATIVE Final    Comment: (NOTE) SARS-CoV-2 target nucleic acids  are NOT DETECTED.  The SARS-CoV-2 RNA is generally detectable in upper and lower respiratory specimens during the acute phase of infection. Negative results do not preclude SARS-CoV-2 infection, do not rule out co-infections with other pathogens, and should not be used as the sole basis for treatment or other patient management decisions. Negative results must be combined with clinical observations, patient history, and epidemiological information. The expected result is Negative.  Fact Sheet for Patients: SugarRoll.be  Fact Sheet for Healthcare  Providers: https://www.woods-mathews.com/  This test is not yet approved or cleared by the [place] FDA and  has been authorized for detection and/or diagnosis of SARS-CoV-2 by FDA under an Emergency Use Authorization (EUA). This EUA will remain  in effect (meaning this test can be used) for the duration of the COVID-19 declaration under Se ction 564(b)(1) of the Act, 21 U.S.C. section 360bbb-3(b)(1), unless the authorization is terminated or revoked sooner.  Performed at [facility], [place] [address] [place], [place] [address]   MRSA Next Gen by PCR, Nasal     Status: None   Collection Time: 07/24/21  5:59 PM   Specimen: Nasal Mucosa; Nasal Swab  Result Value Ref Range Status   MRSA by PCR Next Gen NOT DETECTED NOT DETECTED Final    Comment: (NOTE) The GeneXpert MRSA Assay (FDA approved for NASAL specimens only), is one component of a comprehensive MRSA colonization surveillance program. It is not intended to diagnose MRSA infection nor to guide or monitor treatment for MRSA infections. Test performance is not FDA approved in patients less than 8 years old. Performed at [facility], [place] [address], [place], [place] [address]   Urine Culture     Status: None   Collection Time: 07/24/21  8:17 PM   Specimen: Urine, Clean Catch  Result Value Ref Range Status   Specimen Description URINE, CLEAN CATCH  Final   Special Requests NONE  Final   Culture   Final    NO GROWTH Performed at [facility], [place] [address], [place], [place] [address]    Report Status 07/27/2021 FINAL  Final     Time coordinating discharge:  35 minutes  SIGNED:   Barb Merino, MD  [facility] 07/29/2021, 11:38 AM

## 2021-07-29 NOTE — Plan of Care (Signed)
  Problem: Education: Goal: Knowledge of General Education information will improve Description: Including pain rating scale, medication(s)/side effects and non-pharmacologic comfort measures Outcome: Progressing   Problem: Clinical Measurements: Goal: Will remain free from infection Outcome: Progressing   

## 2021-07-29 NOTE — Consult Note (Signed)
Consultation Note Date: 07/29/2021   Patient Name: Theodore Jenkins  DOB: [birth year]  MRN: [id number]  Age / Sex: 75 y.o., male  PCP: Lezlie Octave, PA-C Referring Physician: Barb Merino, MD  Reason for Consultation: Establishing goals of care  HPI/Patient Profile: 75 y.o. male  with past medical history of myelodysplastic syndrome currently transfusion dependent, CAD status post CABG, aortic stenosis status post valve replacement complicated with strep bacteremia causing endocarditis of the prosthetic valve, and multiple recent hospitalizations admitted on 07/24/2021 with weakness and shortness of breath. He was found with thrombocytopenia, anemia and GI bleeding. Also diagnosed with sepsis d/t bilateral pneumonia. PMT consulted for [place] and symptom management.  Clinical Assessment and Goals of Care: I have reviewed medical records including EPIC notes, labs and imaging, received report from Dr. Sloan Leiter, assessed the patient and then met with patient and his wife  to discuss diagnosis prognosis, GOC, EOL wishes, disposition and options.  I introduced Palliative Medicine as specialized medical care for people living with serious illness. It focuses on providing relief from the symptoms and stress of a serious illness. The goal is to improve quality of life for both the patient and the family.  Patient is quite clear in his goals to go home focus on comfort and quality of life - avoid further aggressive medical intervention. He even shares he is not interested in pursuing any other transfusions.  We discuss hospice care at home vs facility. He is interested in first going home but does state he does not want to die at home so we discussed transition to facility in the near future as he declines. We discuss philosophy of hospice care and type of support provided.  Discussed symptom management. His most bothersome symptoms are shortness of breath  and anxiety - he had a panic attack last night. We discuss the use of oral morphine and oral ativan. He is agreeable to both. We offered scheduling the medications but he would like them to remain as needed as he fears he will become too sedated.   Discussed bowel movements - he is currently having regular bowel movements - had one yesterday. We discuss implementing a bowel regimen d/t increased opiate use.   Discussed with patient/family the importance of continued conversation with family and the medical providers regarding overall plan of care and treatment options, ensuring decisions are within the context of the patient's values and GOCs.    Questions and concerns were addressed. The family was encouraged to call with questions or concerns.   Primary Decision Maker PATIENT    SUMMARY OF RECOMMENDATIONS   - I spoke with hospice liaison through [facility] [place] - they should be reaching out to family to arrange care at home - shared their desire to eventually transfer to hospice facility - morphine 10 mg q 2 hr as needed oral for shortness of breath/pain - ativan 0.5 mg tablet q4hr as needed for anxiety - 2 tabs senna qhs - home with hospice once equipment ready - patient is interested in comfort measures only - does not want to pursue any future transfusions  Code Status/Advance Care Planning: DNR  Prognosis:  Weeks - months  Discharge Planning: Home with Hospice      Primary Diagnoses: Present on Admission:  Sepsis ([place])   I have reviewed the medical record, interviewed the patient and family, and examined the patient. The following aspects are pertinent.  Past Medical History:  Diagnosis Date   Aortic stenosis  Echo (08/2017): Severe AS with mean gradient of 42 mmHg.   Barrett's esophagus    CAD (coronary artery disease) 06/06/2011   Remote stent to the LAD in 2005 // Last cath in 2010 showing diffuse CAD, small PD that is occluded and fills with left to right  collaterals, 60-70% 1st OM, with normal EF. Managed medically // Myoview 3/18:  Normal perfusion. LVEF 55% with normal wall motion. This is a low risk study.   Complication of anesthesia    Diabetes mellitus    ED (erectile dysfunction)    Family history of adverse reaction to anesthesia 10/23/2017   sister had history of malignant hyperthermia when she was 80, she is 72 years old now   GERD (gastroesophageal reflux disease)    Goals of care, counseling/discussion 12/22/2020   Hyperlipidemia    Hypertension    IHD (ischemic heart disease)    prior stenting of the LAD in December of 2005; with last cath in 2010 showing diffuse CAD with normal LV function. He is managed medically.    Malignant hyperthermia    sister had reaction concerning for malignant hyperthermia (~ 1988)   MDS (myelodysplastic syndrome), high grade ([place]) 12/22/2020   Neuromuscular disorder ([place]) 10/23/2017   lumbar 4 disc in back causing nerve pain, on gabapentin   Obesity    Obstructive sleep apnea on CPAP    PONV (postoperative nausea and vomiting)    S/P aortic valve replacement with bioprosthetic valve 10/26/2017   25 mm Edwards Magna Ease bovine pericardial tissue valve   S/P coronary artery stent placement 2005   LAD   Social History   Socioeconomic History   Marital status: Married    Spouse name: Not on file   Number of children: 3   Years of education: Not on file   Highest education level: Not on file  Occupational History   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  Tobacco Use   Smoking status: Former    Packs/day: 1.00    Years: 20.00    Pack years: 20.00    Types: Cigarettes    Quit date: 12/12/1992    Years since quitting: 28.6   Smokeless tobacco: Never  Vaping Use   Vaping Use: Never used  Substance and Sexual Activity   Alcohol use: Yes    Alcohol/week: 1.0 standard drink    Types: 1 Cans of beer per week   Drug use: No   Sexual activity: Not Currently  Other Topics Concern    Not on file  Social History Narrative   Not on file   Social Determinants of Health   Financial Resource Strain: Not on file  Food Insecurity: Not on file  Transportation Needs: Not on file  Physical Activity: Not on file  Stress: Not on file  Social Connections: Not on file   Family History  Problem Relation Age of Onset   Cancer Mother    Heart attack Father 38   CAD Brother    Scheduled Meds:  sodium chloride   Intravenous Once   aminocaproic acid  1 g Oral Q8H   chlorhexidine  15 mL Mouth/Throat BID   Chlorhexidine Gluconate Cloth  6 each Topical Daily   epoetin alfa  40,000 Units Subcutaneous Q48H   fluconazole  100 mg Oral Daily   furosemide  60 mg Intravenous Once   gabapentin  600 mg Oral QHS   insulin aspart  0-15 Units Subcutaneous Q4H   loratadine  10 mg Oral q morning  pantoprazole  40 mg Oral BID   Ensure Max Protein  11 oz Oral BID   rosuvastatin  10 mg Oral QHS   senna-docusate  2 tablet Oral BID   sodium chloride  2 spray Each Nare Q2H   sodium chloride flush  10-40 mL Intracatheter Q12H   valACYclovir  1,000 mg Oral Daily   Continuous Infusions:  cefTRIAXone (ROCEPHIN)  IV Stopped (07/28/21 1614)   PRN Meds:.acetaminophen **OR** acetaminophen, fluticasone, guaiFENesin-dextromethorphan, LORazepam, morphine CONCENTRATE, ondansetron, sodium chloride flush Allergies  Allergen Reactions   Oxycodone Nausea And Vomiting   Review of Systems  Constitutional:  Positive for activity change, appetite change and fatigue.  Respiratory:  Positive for shortness of breath.   Psychiatric/Behavioral:  Positive for sleep disturbance. The patient is nervous/anxious.    Physical Exam Constitutional:      General: He is not in acute distress. Pulmonary:     Effort: Accessory muscle usage present.  Skin:    General: Skin is warm and dry.  Neurological:     Mental Status: He is alert and oriented to person, place, and time.    Vital Signs: BP 109/74 (BP  Location: Left Wrist)   Pulse (!) 111   Temp 98.1 F (36.7 C) (Oral)   Resp 19   Ht 5' 9"  (1.753 m)   Wt 89.8 kg   SpO2 99%   BMI 29.24 kg/m  Pain Scale: 0-10 POSS *See Group Information*: 1-Acceptable,Awake and alert Pain Score: 0-No pain   SpO2: SpO2: 99 % O2 Device:SpO2: 99 % O2 Flow Rate: .O2 Flow Rate (L/min): 8 L/min  IO: Intake/output summary:  Intake/Output Summary (Last 24 hours) at 07/29/2021 0801 Last data filed at 07/29/2021 0700 Gross per 24 hour  Intake 580 ml  Output 1450 ml  Net -870 ml    LBM: Last BM Date: 07/26/21 Baseline Weight: Weight: 89.8 kg Most recent weight: Weight: 89.8 kg     Palliative Assessment/Data: PPS 50%    Time Total: 80 minutes Greater than 50%  of this time was spent counseling and coordinating care related to the above assessment and plan.  Juel Burrow, DNP, AGNP-C Palliative Medicine Team [phone] Pager: [phone]

## 2021-07-29 NOTE — TOC Transition Note (Signed)
Transition of Care [facility]) - CM/SW Discharge Note   Patient Details  Name: Theodore Jenkins MRN: [id number] Date of Birth: 1946/10/26  [facility] CM/SW Contact:  Tresa Endo Phone Number: 07/29/2021, 2:23 PM   Clinical Narrative:    Patient will DC to: Hospice House  Anticipated DC date: 07/29/2021 Family notified: Pt Spouse Transport by: Corey Harold   Per MD patient ready for [facility] to [facility]. RN to call report prior to discharge [phone]). RN, patient, patient's family, and facility notified of DC. Discharge Summary and FL2 sent to facility. DC packet on chart. Ambulance transport requested for patient.   CSW will sign off for now as social work intervention is no longer needed. Please consult [place] again if new needs arise.           Patient Goals and CMS Choice        Discharge Placement                       Discharge Plan and Services                                     Social Determinants of Health (SDOH) Interventions     Readmission Risk Interventions Readmission Risk Prevention Plan 04/30/2020  Post Dischage Appt Complete  Transportation Screening Complete  Some recent data might be hidden

## 2021-07-29 NOTE — Progress Notes (Signed)
Daily Progress Note   Patient Name: Theodore Jenkins       Date: 07/29/2021 DOB: May 03, 1946  Age: 75 y.o. MRN#: [id number] Attending Physician: Barb Merino, MD Primary Care Physician: Lezlie Octave, PA-C Admit Date: 07/24/2021  Reason for Consultation/Follow-up: Establishing goals of care  Subjective: Just had BM and feeling better, feels that PRN morphine and ativan have been helpful for symptom relief  Length of Stay: 5  Current Medications: Scheduled Meds:   sodium chloride   Intravenous Once   aminocaproic acid  1 g Oral Q8H   chlorhexidine  15 mL Mouth/Throat BID   Chlorhexidine Gluconate Cloth  6 each Topical Daily   epoetin alfa  40,000 Units Subcutaneous Q48H   fluconazole  100 mg Oral Daily   gabapentin  600 mg Oral QHS   insulin aspart  0-15 Units Subcutaneous Q4H   loratadine  10 mg Oral q morning   pantoprazole  40 mg Oral BID   Ensure Max Protein  11 oz Oral BID   rosuvastatin  10 mg Oral QHS   senna-docusate  2 tablet Oral BID   sodium chloride  2 spray Each Nare Q2H   sodium chloride flush  10-40 mL Intracatheter Q12H   valACYclovir  1,000 mg Oral Daily    Continuous Infusions:  cefTRIAXone (ROCEPHIN)  IV Stopped (07/28/21 1614)    PRN Meds: acetaminophen **OR** acetaminophen, fluticasone, guaiFENesin-dextromethorphan, LORazepam, morphine CONCENTRATE, ondansetron, sodium chloride flush  Physical Exam Constitutional:      General: He is not in acute distress. Pulmonary:     Effort: Accessory muscle usage present.  Skin:    General: Skin is warm and dry.  Neurological:     Mental Status: He is alert and oriented to person, place, and time.  Psychiatric:        Mood and Affect: Mood normal.            Vital Signs: BP 113/69 (BP Location: Left Wrist)    Pulse (!) 122   Temp 97.9 F (36.6 C) (Oral)   Resp (!) 29   Ht '5\' 9"'$  (1.753 m)   Wt 89.8 kg   SpO2 90%   BMI 29.24 kg/m  SpO2: SpO2: 90 % O2 Device: O2 Device: High Flow Nasal Cannula O2 Flow Rate: O2 Flow Rate (L/min): 8 L/min  Intake/output summary:  Intake/Output Summary (Last 24 hours) at 07/29/2021 1046 Last data filed at 07/29/2021 1000 Gross per 24 hour  Intake 1060 ml  Output 1450 ml  Net -390 ml   LBM: Last BM Date: 07/26/21 Baseline Weight: Weight: 89.8 kg Most recent weight: Weight: 89.8 kg       Palliative Assessment/Data: PPS 50%    Flowsheet Rows    Flowsheet Row Most Recent Value  Intake Tab   Referral Department Hospitalist  Unit at Time of Referral Intermediate Care Unit  Palliative Care Primary Diagnosis Cancer  Date Notified 07/26/21  Palliative Care Type New Palliative care  Reason for referral Clarify Goals of Care  Date of Admission 07/24/21  Date first seen by Palliative Care 07/28/21  # of days Palliative referral response time 2 Day(s)  # of days IP prior to Palliative referral 2  Clinical Assessment   Psychosocial & Spiritual Assessment   Palliative Care Outcomes        Patient Active Problem List   Diagnosis Date Noted   Sepsis ([place]) 07/24/2021   Melena    MDS (myelodysplastic syndrome) (HCC)    Non-ST elevation (NSTEMI) myocardial infarction ([place])    Bacteremia due to Gram-positive bacteria    Chest pain 07/07/2021   Acute blood loss anemia (ABLA) 07/07/2021   Pressure injury of skin 06/29/2021   Thrombocytopenia ([place]) 06/28/2021   Stage 3 chronic kidney disease ([place]) 06/27/2021   MDS (myelodysplastic syndrome), high grade ([place]) 12/22/2020   Goals of care, counseling/discussion 12/22/2020   Anemia 12/10/2020   AKI (acute kidney injury) ([place]) 12/10/2020   Acute bacterial endocarditis    Hyponatremia 04/30/2020   Bacteremia 04/29/2020   S/P AVR (aortic valve replacement) 02/15/2018   S/P aortic valve replacement with  bioprosthetic valve  10/26/2017   S/P CABG x 2 10/26/2017   Type 2 diabetes mellitus with complication, without long-term current use of insulin ([facility])    Accelerating angina ([place]) 08/18/2017   OSA on CPAP 01/06/2017   Carotid artery disease, unspecified laterality ([place]) 08/08/2016   Abdominal aortic aneurysm without rupture ([place]) 12/22/2015   Exertional chest pain 10/03/2014   Aortic valve stenosis 05/03/2014   ED (erectile dysfunction) 03/20/2012   Coronary artery disease involving native coronary artery of native heart without angina pectoris 06/06/2011   Hyperlipidemia LDL goal <70 06/06/2011   Essential hypertension 06/06/2011   Obesities, morbid ([place]) 06/06/2011    Palliative Care Assessment & Plan   HPI: 75 y.o. male  with past medical history of myelodysplastic syndrome currently transfusion dependent, CAD status post CABG, aortic stenosis status post valve replacement complicated with strep bacteremia causing endocarditis of the prosthetic valve, and multiple recent hospitalizations admitted on 07/24/2021 with weakness and shortness of breath. He was found with thrombocytopenia, anemia and GI bleeding. Also diagnosed with sepsis d/t bilateral pneumonia. PMT consulted for [place] and symptom management.  Assessment: Patient and wife remain interested in focusing on comfort.   Patient shares with me he had large BM this morning - feels better after BM but during process of getting to bedside commode he realized how incredibly weak he is. He fears going home is not a good option now. We discuss his statements yesterday about wanting to be at his home, at least a little while, to spend more time with family and pets. We discuss ways to make home easier - medical equipment, bedpan, family assistance. After discussion, patient and wife both feel that home is not a good option. We then discussed option of hospice facility. Discussed type of care provided at hospice facility. They are interested  in this. We discussed making referral to hospice home and cancelling equipment delivery to their home today.  Discussed his symptoms. He feels that when he receives PRN meds his symptoms are controlled but sometimes he lets the symptoms "go too far" before requesting medications. We discussed that yesterday he did not like the idea of being on scheduled medications but maybe we should consider that more to keep his symptoms under control - he agrees to this. We discussed scheduling meds and also keeping PRN meds available.   Discussed visitation policy with unit leadership and nursing staff - will allow for more family to visit - patient and family very pleased with this.   Recommendations/Plan: Dc to hospice facility - have discussed with hospice liaison and TOC Scheduled ativan  and morphine by mouth- continue PRN as well [facility] for dc whenever bed is available at hospice facility  Goals of Care and Additional Recommendations: Limitations on Scope of Treatment: Full Comfort Care  Code Status: DNR  Prognosis:  < 2 weeks  Discharge Planning: Hospice facility  Care plan was discussed with patient, wife, Dr. Sloan Leiter, hospice liaison, RN, [facility] team  Thank you for allowing the Palliative Medicine Team to assist in the care of this patient.   Total Time 40 minutes Prolonged Time Billed  no       Greater than 50%  of this time was spent counseling and coordinating care related to the above assessment and plan.  Juel Burrow, DNP, [facility] Palliative Medicine Team Team Phone # [phone]  Pager [phone]

## 2021-07-29 NOTE — Progress Notes (Signed)
Overall, Theodore Jenkins  status is about the same.  The Ativan that he is getting seems to work quite well for him.  It does help relax him.  It does help slow his heart rate down.  His oxygen saturation is 97%.  His hemoglobin is 7.3.  I do think it would be a bad idea to give him 1 unit of blood.  He has little bit of swelling.  I think the diuretic after blood will help a little bit.  His appetite is okay.  He does not eat too much.  There is no bleeding.  There is no hemoptysis.  He is a little constipated.  We can try to help with a laxative or stool softener.  We can do morphine nebulizers at home.  We will try to order these again.  I really do think that they can help.  They are not absorbed systemically so there should not be any change in mental state because of this.  There has been no fever.  He says he is not urinating all that much.  Again, I diuretic will help with this I think.  It sounds like we are going to get Hospice for him.  I think this is reasonable.  I just worry about him not being able to get transfused, particularly with platelets.  I am not sure hospice will pay for Amicar.  His vital signs are all pretty stable.  He is tachycardic with a heart rate of 111.  Oxygen saturation is 99%.  Blood pressure is 109/74.  His lungs sound clear bilaterally.  I hear no wheezes.  Cardiac exam tachycardic but regular.  Abdomen is soft.  Bowel sounds are present.  Extremities shows chronic edema in the legs.  Skin exam does show scattered ecchymoses.  Neurological exam is nonfocal.  Again, I just do not believe that we will give Theodore Jenkins any Jenkins therapy.  I does think the risks outweigh the benefits.  Our goal clearly is his quality of life.  I think he would want to have quality of life as a priority.  His wife is so good.  She really helps him.  She is incredibly dedicated to his care and she wants him to have comfort, respect and dignity.  I do appreciate the outstanding care  he is getting from the staff in [place].  I do appreciate all of their hard work.  Lattie Haw, MD  Psalm 100:5

## 2021-07-29 NOTE — Progress Notes (Signed)
PT Cancellation Note  Patient Details Name: Theodore Jenkins MRN: [id number] DOB: 02-Sep-1946   Cancelled Treatment:    Reason Eval/Treat Not Completed: Other (comment). Per Dr. Sloan Leiter, pt transitioning to hospice/comfort care and not appropriate for PT intervention. PT signing off.   Lorriane Shire 07/29/2021, 10:27 AM  Lorrin Goodell, PT  Office # [phone] Pager [phone]

## 2021-07-29 NOTE — Progress Notes (Signed)
OT Cancellation Note  Patient Details Name: Theodore Jenkins MRN: [id number] DOB: 1946-04-16   Cancelled Treatment:    Reason Eval/Treat Not Completed: Other (comment). Per Pallative team, pt now comfort care. Acute OT signing off.   Tyrone Schimke, OT Acute Rehabilitation Services Pager: [phone] Office: [phone]  07/29/2021, 10:07 AM

## 2021-07-29 NOTE — Progress Notes (Signed)
PROGRESS NOTE    Theodore Jenkins  YHC:[id number] DOB: [birth year] DOA: 07/24/2021 PCP: Lezlie Octave, PA-C   Brief Narrative:  Theodore Jenkins is a 75 y.o. male with medical history significant of myelodysplastic syndrome currently transfusion dependent, CAD status post CABG, aortic stenosis status post valve replacement complicated with strep bacteremia causing endocarditis of the prosthetic valve. Recent multiple hospitalization.   Admitted with weakness and shortness of breath.  He was found with thrombocytopenia, anemia and GI bleeding.  Admitted and treated symptomatically.  See goal of care discussion below.  Assessment & Plan:  Goals of Care discussion. 8/17  I met with patient and his wife at the bedside. We discussed about ongoing issues, difficulty to treat conditions.  Patient and his wife were ready to accept that he has untreatable condition and at this time he is suffering.  Patient is at end-of-life and will be best served by hospice.  He has enough support system at home.   Started on morphine sublingual and helping patient with relief of anxiety.  Continue Concerta morphine 10 mg every 2 hours as needed. Ativan 0.5 mg every 4 hours, patient stated good relief of symptoms, will increase dose to 1 mg every 4 hours. Hospice appointment tried to arrange hospice admission at home. DNR/DNI. Discharge when equipments are in place. Do not escalate care. 1/18, Dr. Marin Olp wrote him a transfusion of blood.  Patient reluctantly agreed to eat. Oncology started on morphine nebulizers, will need to verify with hospice if this can be done. Patient is on maintenance Rocephin until 9/9 for previous endocarditis.  They have any of antibiotics at home.  May or may not benefit with continuing it.  He can continue until supplies last.  Sepsis present on admission due to bilateral pneumonia with hypoxemia: -Tachycardia, leukopenia, notable right lower lobe opacification on imaging  -  Lactic acidosis downtrending with IV fluids and antibiotics appropriately  - Patient received 5 days of cefepime and vancomycin.  We will discontinue further antibiotic therapy. Cultures are negative so far. Will need oxygen to go home.  Back on maintenance Rocephin.  Acute symptomatic anemia secondary to presumed upper GI bleed Chronic anemia of chronic disease (MDS) - Hemoglobin 5.7 on presentation.  -Received total 7 units of PRBC last 6 days.  -Received total 3 units of pooled platelets.  -Patient does not benefit with further transfusions.  -We will continue oral Protonix.     MDS (myelodysplastic syndrome), high grade (HCC) with pancytopenia -Follows with Dr. Marin Olp, appreciate insight and recommendations  -Per patient and wife patient has had very poor response to previous chemotherapy  -Plan to go home with home hospice.  AVR with bioprosthetic valve -Recent H/xo bacteremia GM + strep species (07/07/2021) -noted not to be Enterococcus, agalactiae, pyogenes or pneumoniae - TEE not obtained at that time due to pancytopenia. - Sent home on 8/3 with Rocephin 2g daily until 9/9.  Patient received 5 days of cefepime and vancomycin.  Resume Rocephin.  Patient has Port-A-Cath and can take it.     HTN -Holding home medications given hypotension in the setting of above    Non-insulin-dependent diabetes type 2  -Continue to hold home medications including Janumet  -Hypoglycemic protocol, sliding scale insulin ongoing -Does not want to subject him to unnecessary invasive procedures, can go back on Janumet on discharge.   DVT prophylaxis: scd only given likely acute bleed as above Code Status: dnr  Family Communication: wife at bedside   Status is: Inpatient  Dispo: The patient is from: Home              Anticipated d/c is to: Home with hospice.  When arrangements are made.              Anticipated d/c date is: Whenever hospice can admit him at home.              Patient currently  not medically stable for discharge.  Only can be discharged when hospice in place.  Consultants:  GI, oncology, palliative care  Procedures:  None  Antimicrobials:  Cefepime, vancomycin, discontinue. Rocephin 2 g daily---  Subjective: Patient seen and examined.  Wife arrived to the bedside.  Overnight events noted.  He did feel some relief with shortness of breath after taking morphine and Ativan. He feels shaky and extremely weak. Patient wants to go home and if needed they want to go to residential hospice.  They are very aware about residential hospice program at [facility]. I asked him whether he would like to get the blood transfusion that was ordered, he reluctantly agreed and tells me that this may give him 1 extra day so that he may be able to go home.   Objective: Vitals:   07/29/21 0400 07/29/21 0746 07/29/21 1008 07/29/21 1014  BP: 109/74  113/69   Pulse: (!) 111  (!) 122   Resp: 19  (!) 29   Temp: 97.8 F (36.6 C) 98.1 F (36.7 C) 97.6 F (36.4 C) 97.9 F (36.6 C)  TempSrc: Oral Oral Oral Oral  SpO2: 99%  90%   Weight:      Height:        Intake/Output Summary (Last 24 hours) at 07/29/2021 1110 Last data filed at 07/29/2021 1000 Gross per 24 hour  Intake 1060 ml  Output 1450 ml  Net -390 ml   Filed Weights   07/24/21 0348  Weight: 89.8 kg    Examination: General: Chronically sick looking.  Frail and debilitated gentleman.  Not in any distress.  He is currently on SpO2: 93 % O2 Flow Rate (L/min): 10 L/min  Cardiovascular: S1-S2 normal.  Tachycardic. Respiratory: Bilateral conducted airway sounds.  Mostly clear.  Right chest wall Port-A-Cath present. Gastrointestinal: Soft.  Nontender.  Bowel sounds present. Ext: Trace bilateral pedal edema.  No cyanosis. Neuro: Alert oriented x4.  No deficits.     Data Reviewed: I have personally reviewed following labs and imaging studies  CBC: Recent Labs  Lab 07/24/21 0535 07/25/21 0242 07/25/21 1944  07/26/21 0411 07/26/21 1846 07/27/21 0540 07/28/21 0345 07/29/21 0455  WBC 2.8* 3.0*  --  1.4*  --  1.3* 1.7* 2.6*  NEUTROABS 1.4*  --   --   --   --   --   --   --   HGB 5.7* 6.8*   < > 7.7* 9.6* 7.5* 8.2* 7.3*  HCT 17.4* 19.8*   < > 22.5* 28.5* 21.5* 23.7* 21.8*  MCV 96.1 88.0  --  88.6  --  89.6 88.8 90.5  PLT 19* 22*  --  10*  --  10* 19* 16*   < > = values in this interval not displayed.   Basic Metabolic Panel: Recent Labs  Lab 07/24/21 0535 07/25/21 0242 07/26/21 0411 07/27/21 0540  NA 125* 125* 127* 126*  K 4.1 3.8 3.1* 3.5  CL 97* 100 101 100  CO2 18* 16* 20* 18*  GLUCOSE 169* 170* 164* 172*  BUN 31* 31* 22 18  CREATININE 1.18 0.99 0.89 0.86  CALCIUM 8.3* 7.7* 7.6* 7.7*  MG  --   --   --  1.8   GFR: Estimated Creatinine Clearance: 86 mL/min (by C-G formula based on SCr of 0.86 mg/dL). Liver Function Tests: Recent Labs  Lab 07/24/21 0535  AST 28  ALT 17  ALKPHOS 65  BILITOT 1.0  PROT 6.8  ALBUMIN 2.9*   No results for input(s): LIPASE, AMYLASE in the last 168 hours. No results for input(s): AMMONIA in the last 168 hours. Coagulation Profile: Recent Labs  Lab 07/24/21 0535 07/25/21 0242  INR 1.3* 1.3*   Cardiac Enzymes: No results for input(s): CKTOTAL, CKMB, CKMBINDEX, TROPONINI in the last 168 hours. BNP (last 3 results) Recent Labs    12/09/20 1629  PROBNP 415*   HbA1C: No results for input(s): HGBA1C in the last 72 hours. CBG: Recent Labs  Lab 07/28/21 1719 07/28/21 2046 07/29/21 0006 07/29/21 0359 07/29/21 0743  GLUCAP 179* 208* 202* 177* 196*   Lipid Profile: No results for input(s): CHOL, HDL, LDLCALC, TRIG, CHOLHDL, LDLDIRECT in the last 72 hours. Thyroid Function Tests: No results for input(s): TSH, T4TOTAL, FREET4, T3FREE, THYROIDAB in the last 72 hours. Anemia Panel: No results for input(s): VITAMINB12, FOLATE, FERRITIN, TIBC, IRON, RETICCTPCT in the last 72 hours. Sepsis Labs: Recent Labs  Lab 07/24/21 0535  07/24/21 0805 07/25/21 0242  PROCALCITON  --   --  0.11  LATICACIDVEN 3.1* 4.7*  --     Recent Results (from the past 240 hour(s))  Blood culture (routine single)     Status: None (Preliminary result)   Collection Time: 07/24/21  6:39 AM   Specimen: BLOOD  Result Value Ref Range Status   Specimen Description BLOOD SITE NOT SPECIFIED  Final   Special Requests   Final    BOTTLES DRAWN AEROBIC AND ANAEROBIC Blood Culture adequate volume   Culture   Final    NO GROWTH 4 DAYS Performed at [facility], [address], [place], [place] [address]    Report Status PENDING  Incomplete  Culture, blood (single)     Status: None (Preliminary result)   Collection Time: 07/24/21  8:05 AM   Specimen: BLOOD  Result Value Ref Range Status   Specimen Description BLOOD LEFT ANTECUBITAL  Final   Special Requests   Final    BOTTLES DRAWN AEROBIC AND ANAEROBIC Blood Culture adequate volume   Culture   Final    NO GROWTH 4 DAYS Performed at [facility], [place] [address] [place], [place] [address]    Report Status PENDING  Incomplete  SARS CORONAVIRUS 2 (TAT 6-24 HRS) Nasopharyngeal Nasopharyngeal Swab     Status: None   Collection Time: 07/24/21  8:45 AM   Specimen: Nasopharyngeal Swab  Result Value Ref Range Status   SARS Coronavirus 2 NEGATIVE NEGATIVE Final    Comment: (NOTE) SARS-CoV-2 target nucleic acids are NOT DETECTED.  The SARS-CoV-2 RNA is generally detectable in upper and lower respiratory specimens during the acute phase of infection. Negative results do not preclude SARS-CoV-2 infection, do not rule out co-infections with other pathogens, and should not be used as the sole basis for treatment or other patient management decisions. Negative results must be combined with clinical observations, patient history, and epidemiological information. The expected result is Negative.  Fact Sheet for Patients: SugarRoll.be  Fact Sheet for  Healthcare Providers: https://www.woods-mathews.com/  This test is not yet approved or cleared by the [place] FDA and  has been authorized for detection and/or diagnosis of SARS-CoV-2 by FDA under an Emergency Use Authorization (EUA). This EUA will remain  in effect (meaning this test can be used) for the duration of the COVID-19 declaration under Se ction 564(b)(1) of the Act, 21 U.S.C. section 360bbb-3(b)(1), unless the authorization is terminated or revoked sooner.  Performed at [facility], [place] [address] [place], [place] [address]   MRSA Next Gen by PCR, Nasal     Status: None   Collection Time: 07/24/21  5:59 PM   Specimen: Nasal Mucosa; Nasal Swab  Result Value Ref Range Status   MRSA by PCR Next Gen NOT DETECTED NOT DETECTED Final    Comment: (NOTE) The GeneXpert MRSA Assay (FDA approved for NASAL specimens only), is one component of a comprehensive MRSA colonization surveillance program. It is not intended to diagnose MRSA infection nor to guide or monitor treatment for MRSA infections. Test performance is not FDA approved in patients less than 65 years old. Performed at [facility], [place] [address] [place], [place] [address]   Urine Culture     Status: None   Collection Time: 07/24/21  8:17 PM   Specimen: Urine, Clean Catch  Result Value Ref Range Status   Specimen Description URINE, CLEAN CATCH  Final   Special Requests NONE  Final   Culture   Final    NO GROWTH Performed at [facility], [place] [address], [place], [place] [address]    Report Status 07/27/2021 FINAL  Final          Radiology Studies: No results found.      Scheduled Meds:  sodium chloride   Intravenous Once   aminocaproic acid  1 g Oral Q8H   chlorhexidine  15 mL Mouth/Throat BID   Chlorhexidine Gluconate Cloth  6 each Topical Daily   epoetin alfa  40,000 Units Subcutaneous Q48H   fluconazole  100 mg Oral Daily   gabapentin  600 mg Oral QHS    insulin aspart  0-15 Units Subcutaneous Q4H   loratadine  10 mg Oral q morning   pantoprazole  40 mg Oral BID   Ensure Max Protein  11 oz Oral BID   rosuvastatin  10 mg Oral QHS   senna-docusate  2 tablet Oral BID   sodium chloride  2 spray Each Nare Q2H   sodium chloride flush  10-40 mL Intracatheter Q12H   valACYclovir  1,000 mg Oral Daily   Continuous Infusions:  cefTRIAXone (ROCEPHIN)  IV Stopped (07/28/21 1614)      LOS: 5 days   Time spent: 30 minutes  If 7PM-7AM, please contact night-coverage [email or web address]  07/29/2021, 11:10 AM

## 2021-07-29 NOTE — Progress Notes (Signed)
RN provided report to [facility] at [facility]. Patient awaiting PTAR pick up. VSS at discharge.

## 2021-07-30 ENCOUNTER — Ambulatory Visit: Payer: PPO

## 2021-07-30 LAB — TYPE AND SCREEN
ABO/RH(D): A POS
Antibody Screen: NEGATIVE
Unit division: 0

## 2021-07-30 LAB — BPAM RBC
Blood Product Expiration Date: 202209072359
ISSUE DATE / TIME: 202208181006
Unit Type and Rh: 5100

## 2021-08-01 LAB — SUSCEPTIBILITY, AER + ANAEROB

## 2021-08-01 LAB — SUSCEPTIBILITY RESULT

## 2021-08-02 ENCOUNTER — Other Ambulatory Visit (HOSPITAL_COMMUNITY): Payer: Self-pay

## 2021-08-02 ENCOUNTER — Encounter: Payer: Self-pay | Admitting: *Deleted

## 2021-08-02 DIAGNOSIS — R7881 Bacteremia: Secondary | ICD-10-CM | POA: Diagnosis not present

## 2021-08-02 NOTE — Progress Notes (Signed)
Received a faxed notification of death from [place] at [facility] that patient passed away on 2021-08-30 at 12:00 pm. Message given to MD and staff.

## 2021-08-10 ENCOUNTER — Ambulatory Visit: Payer: PPO | Admitting: Internal Medicine

## 2021-08-10 LAB — CULTURE, BLOOD (ROUTINE X 2): Special Requests: ADEQUATE

## 2021-08-12 DEATH — deceased

## 2022-03-27 IMAGING — US IR IMAGING GUIDED PORT INSERTION
1 series · 1 of 1 positions shown · non-contrast
Comparison: Chest CT-12/11/2020

INDICATION: History of MDS. In need of durable intravenous access for
chemotherapy administration.

EXAM:
IMPLANTED PORT A CATH PLACEMENT WITH ULTRASOUND AND FLUOROSCOPIC
GUIDANCE

[Series 1: (id) · 1 of 1 slices shown]
[im 1/1]
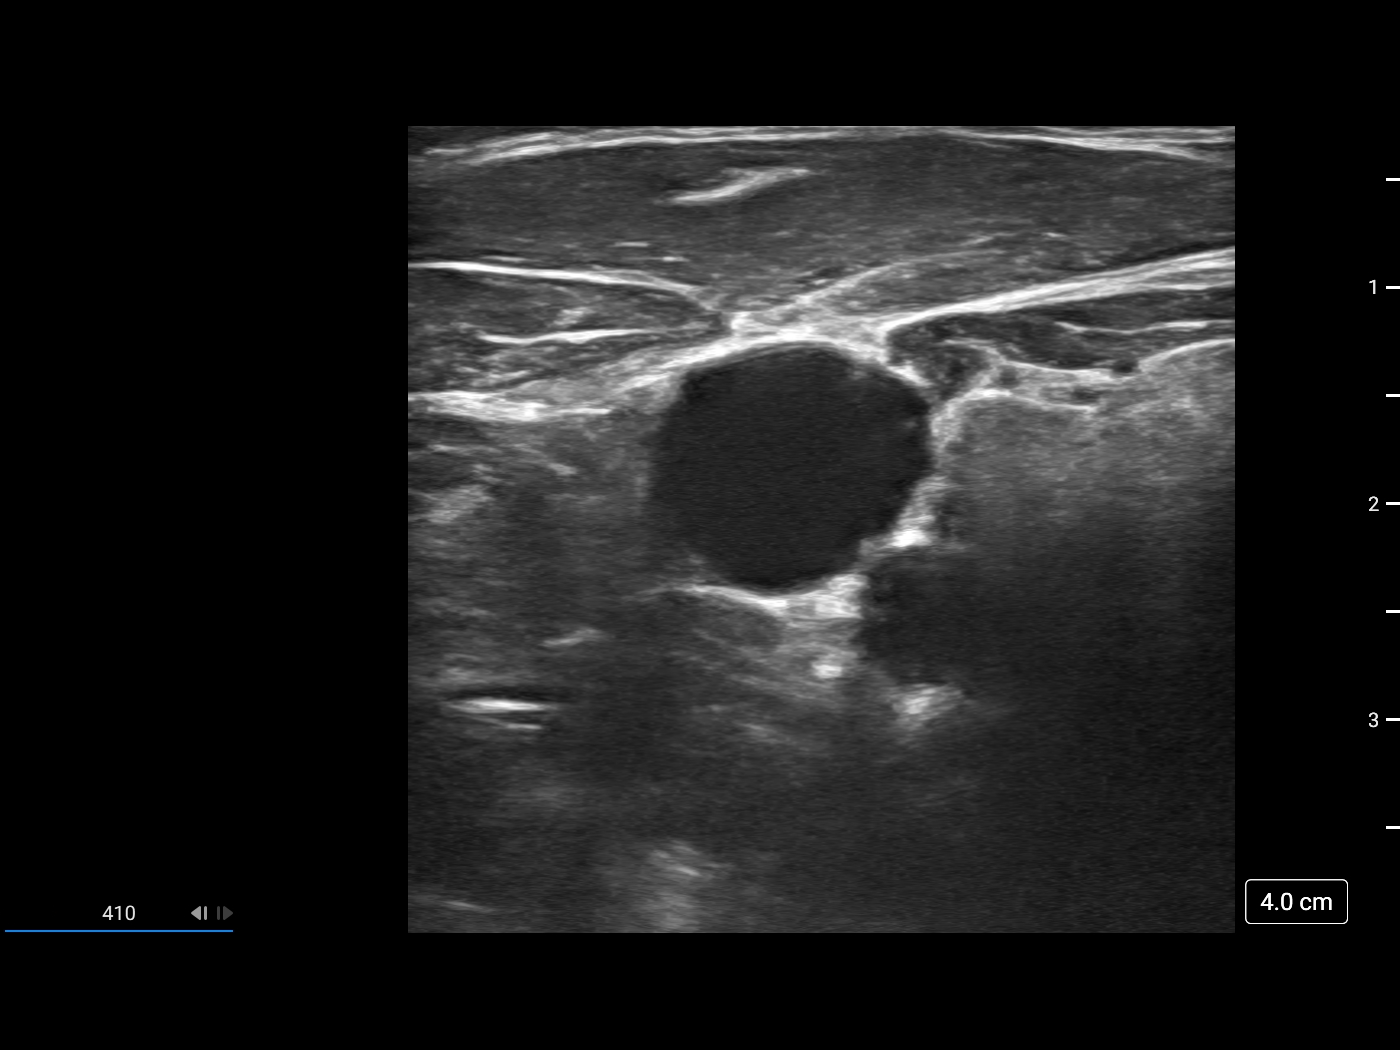

[1 of 1 positions shown; findings below may reference images not displayed]

MEDICATIONS:
Ancef 2 gm IV; The antibiotic was administered within an appropriate
time interval prior to skin puncture.

ANESTHESIA/SEDATION:
Moderate (conscious) sedation was employed during this procedure. A
total of Versed mg and Fentanyl mcg was administered intravenously.

Moderate Sedation Time: minutes. The patient's level of
consciousness and vital signs were monitored continuously by
radiology nursing throughout the procedure under my direct
supervision.

CONTRAST:  None

FLUOROSCOPY TIME:  48 seconds (22 mGy)

COMPLICATIONS:
None immediate.

PROCEDURE:
The procedure, risks, benefits, and alternatives were explained to
the patient. Questions regarding the procedure were encouraged and
answered. The patient understands and consents to the procedure.

The right neck and chest were prepped with chlorhexidine in a
sterile fashion, and a sterile drape was applied covering the
operative field. Maximum barrier sterile technique with sterile
gowns and gloves were used for the procedure. A timeout was
performed prior to the initiation of the procedure. Local anesthesia
was provided with 1% lidocaine with epinephrine.

After creating a small venotomy incision, a micropuncture kit was
utilized to access the internal jugular vein. Real-time ultrasound
guidance was utilized for vascular access including the acquisition
of a permanent ultrasound image documenting patency of the accessed
vessel. The microwire was utilized to measure appropriate catheter
length.

A subcutaneous port pocket was then created along the upper chest
wall utilizing a combination of sharp and blunt dissection. The
pocket was irrigated with sterile saline. A single lumen "Slim"
sized power injectable port was chosen for placement. The 8 Fr
catheter was tunneled from the port pocket site to the venotomy
incision. The port was placed in the pocket. The external catheter
was trimmed to appropriate length. At the venotomy, an 8 Fr
peel-away sheath was placed over a guidewire under fluoroscopic
guidance. The catheter was then placed through the sheath and the
sheath was removed. Final catheter positioning was confirmed and
documented with a fluoroscopic spot radiograph. The port was
accessed with Qian Berrios needle, aspirated and flushed with heparinized
saline.

The venotomy site was closed with an interrupted 4-0 Vicryl suture.
The port pocket incision was closed with interrupted 2-0 Vicryl
suture. The skin was opposed with a running subcuticular 4-0 Vicryl
suture. Dermabond and [place] were applied to both incisions.
Dressings were applied. The patient tolerated the procedure well
without immediate post procedural complication.
FINDINGS: After catheter placement, the tip lies within the superior
cavoatrial junction. The catheter aspirates and flushes normally and
is ready for immediate use.
IMPRESSION: Successful placement of a right internal jugular approach power
injectable Port-A-Cath. The catheter is ready for immediate use.
